# Patient Record
Sex: Female | Born: 1935
Health system: Southern US, Community
[De-identification: ages and names within clinical notes are randomized; demographics above are authoritative.]

## PROBLEM LIST (undated history)

## (undated) DIAGNOSIS — I10 Essential (primary) hypertension: Secondary | ICD-10-CM

## (undated) DIAGNOSIS — R51 Headache: Secondary | ICD-10-CM

## (undated) DIAGNOSIS — M8580 Other specified disorders of bone density and structure, unspecified site: Secondary | ICD-10-CM

## (undated) DIAGNOSIS — K279 Peptic ulcer, site unspecified, unspecified as acute or chronic, without hemorrhage or perforation: Secondary | ICD-10-CM

## (undated) DIAGNOSIS — I5030 Unspecified diastolic (congestive) heart failure: Secondary | ICD-10-CM

## (undated) DIAGNOSIS — K219 Gastro-esophageal reflux disease without esophagitis: Secondary | ICD-10-CM

## (undated) DIAGNOSIS — F4320 Adjustment disorder, unspecified: Secondary | ICD-10-CM

## (undated) DIAGNOSIS — D649 Anemia, unspecified: Secondary | ICD-10-CM

## (undated) DIAGNOSIS — I2510 Atherosclerotic heart disease of native coronary artery without angina pectoris: Secondary | ICD-10-CM

## (undated) DIAGNOSIS — B029 Zoster without complications: Secondary | ICD-10-CM

## (undated) DIAGNOSIS — I82409 Acute embolism and thrombosis of unspecified deep veins of unspecified lower extremity: Secondary | ICD-10-CM

## (undated) DIAGNOSIS — I219 Acute myocardial infarction, unspecified: Secondary | ICD-10-CM

## (undated) DIAGNOSIS — E785 Hyperlipidemia, unspecified: Secondary | ICD-10-CM

## (undated) DIAGNOSIS — E70319 Ocular albinism, unspecified: Secondary | ICD-10-CM

## (undated) DIAGNOSIS — F4321 Adjustment disorder with depressed mood: Secondary | ICD-10-CM

## (undated) DIAGNOSIS — I252 Old myocardial infarction: Secondary | ICD-10-CM

## (undated) DIAGNOSIS — I639 Cerebral infarction, unspecified: Secondary | ICD-10-CM

## (undated) DIAGNOSIS — G600 Hereditary motor and sensory neuropathy: Secondary | ICD-10-CM

## (undated) DIAGNOSIS — K5790 Diverticulosis of intestine, part unspecified, without perforation or abscess without bleeding: Secondary | ICD-10-CM

## (undated) DIAGNOSIS — R32 Unspecified urinary incontinence: Secondary | ICD-10-CM

## (undated) DIAGNOSIS — K449 Diaphragmatic hernia without obstruction or gangrene: Secondary | ICD-10-CM

## (undated) HISTORY — DX: Zoster without complications: B02.9

## (undated) HISTORY — DX: Diverticulosis of intestine, part unspecified, without perforation or abscess without bleeding: K57.90

## (undated) HISTORY — DX: Gastro-esophageal reflux disease without esophagitis: K21.9

## (undated) HISTORY — DX: Other specified disorders of bone density and structure, unspecified site: M85.80

## (undated) HISTORY — DX: Hereditary motor and sensory neuropathy: G60.0

## (undated) HISTORY — DX: Anemia, unspecified: D64.9

## (undated) HISTORY — DX: Headache: R51

## (undated) HISTORY — DX: Hyperlipidemia, unspecified: E78.5

## (undated) HISTORY — DX: Acute embolism and thrombosis of unspecified deep veins of unspecified lower extremity: I82.409

## (undated) HISTORY — DX: Adjustment disorder, unspecified: F43.20

## (undated) HISTORY — DX: Essential (primary) hypertension: I10

## (undated) HISTORY — DX: Peptic ulcer, site unspecified, unspecified as acute or chronic, without hemorrhage or perforation: K27.9

## (undated) HISTORY — DX: Ocular albinism, unspecified: E70.319

## (undated) HISTORY — DX: Adjustment disorder with depressed mood: F43.21

## (undated) HISTORY — DX: Cerebral infarction, unspecified: I63.9

## (undated) HISTORY — DX: Unspecified urinary incontinence: R32

## (undated) HISTORY — DX: Diaphragmatic hernia without obstruction or gangrene: K44.9

---

## 1962-01-27 HISTORY — PX: VESICOVAGINAL FISTULA CLOSURE W/ TAH: SUR271

## 1998-01-27 HISTORY — PX: HIATAL HERNIA REPAIR: SHX195

## 2000-01-28 HISTORY — PX: HERNIA REPAIR: SHX51

## 2000-01-28 HISTORY — PX: COLONOSCOPY: SHX174

## 2000-01-28 LAB — HM COLONOSCOPY: HM Colonoscopy: NORMAL

## 2001-01-27 DIAGNOSIS — I82409 Acute embolism and thrombosis of unspecified deep veins of unspecified lower extremity: Secondary | ICD-10-CM

## 2001-01-27 HISTORY — DX: Acute embolism and thrombosis of unspecified deep veins of unspecified lower extremity: I82.409

## 2001-09-08 ENCOUNTER — Encounter: Payer: Self-pay | Admitting: Orthopaedic Surgery

## 2001-09-14 ENCOUNTER — Inpatient Hospital Stay (HOSPITAL_COMMUNITY): Admission: RE | Admit: 2001-09-14 | Discharge: 2001-09-17 | Payer: Self-pay | Admitting: Orthopaedic Surgery

## 2001-09-16 ENCOUNTER — Encounter: Payer: Self-pay | Admitting: Orthopaedic Surgery

## 2001-09-17 ENCOUNTER — Inpatient Hospital Stay
Admission: RE | Admit: 2001-09-17 | Discharge: 2001-09-29 | Payer: Self-pay | Admitting: Physical Medicine & Rehabilitation

## 2002-05-23 ENCOUNTER — Emergency Department (HOSPITAL_COMMUNITY): Admission: EM | Admit: 2002-05-23 | Discharge: 2002-05-23 | Payer: Self-pay | Admitting: Emergency Medicine

## 2002-05-23 ENCOUNTER — Encounter: Payer: Self-pay | Admitting: Emergency Medicine

## 2004-03-27 HISTORY — PX: TOTAL KNEE ARTHROPLASTY: SHX125

## 2004-08-23 ENCOUNTER — Inpatient Hospital Stay: Payer: Self-pay | Admitting: Internal Medicine

## 2004-11-07 ENCOUNTER — Inpatient Hospital Stay (HOSPITAL_COMMUNITY): Admission: RE | Admit: 2004-11-07 | Discharge: 2004-11-12 | Payer: Self-pay | Admitting: Orthopaedic Surgery

## 2004-11-07 ENCOUNTER — Ambulatory Visit: Payer: Self-pay | Admitting: Physical Medicine & Rehabilitation

## 2004-11-12 ENCOUNTER — Inpatient Hospital Stay
Admission: RE | Admit: 2004-11-12 | Discharge: 2004-11-20 | Payer: Self-pay | Admitting: Physical Medicine & Rehabilitation

## 2006-08-16 ENCOUNTER — Encounter: Payer: Self-pay | Admitting: Family Medicine

## 2006-11-11 ENCOUNTER — Encounter: Payer: Self-pay | Admitting: Family Medicine

## 2006-11-30 ENCOUNTER — Ambulatory Visit: Payer: Self-pay | Admitting: Family Medicine

## 2006-11-30 DIAGNOSIS — J45909 Unspecified asthma, uncomplicated: Secondary | ICD-10-CM

## 2006-11-30 DIAGNOSIS — Z8672 Personal history of thrombophlebitis: Secondary | ICD-10-CM

## 2006-11-30 DIAGNOSIS — K922 Gastrointestinal hemorrhage, unspecified: Secondary | ICD-10-CM | POA: Insufficient documentation

## 2006-11-30 DIAGNOSIS — I10 Essential (primary) hypertension: Secondary | ICD-10-CM | POA: Insufficient documentation

## 2006-11-30 DIAGNOSIS — Z8719 Personal history of other diseases of the digestive system: Secondary | ICD-10-CM | POA: Insufficient documentation

## 2006-12-01 LAB — CONVERTED CEMR LAB
Basophils Relative: 0.4 % (ref 0.0–1.0)
Eosinophils Relative: 1.4 % (ref 0.0–5.0)
HCT: 39.4 % (ref 36.0–46.0)
Lymphocytes Relative: 23.7 % (ref 12.0–46.0)
MCV: 92.7 fL (ref 78.0–100.0)
Monocytes Relative: 6.8 % (ref 3.0–11.0)
Platelets: 153 10*3/uL (ref 150–400)
RDW: 12.7 % (ref 11.5–14.6)
WBC: 6.4 10*3/uL (ref 4.5–10.5)

## 2006-12-03 ENCOUNTER — Encounter: Payer: Self-pay | Admitting: Family Medicine

## 2007-03-05 ENCOUNTER — Telehealth: Payer: Self-pay | Admitting: Family Medicine

## 2007-04-22 ENCOUNTER — Telehealth: Payer: Self-pay | Admitting: Family Medicine

## 2007-05-26 ENCOUNTER — Ambulatory Visit: Payer: Self-pay | Admitting: Family Medicine

## 2007-05-26 DIAGNOSIS — E785 Hyperlipidemia, unspecified: Secondary | ICD-10-CM | POA: Insufficient documentation

## 2007-05-27 ENCOUNTER — Encounter (INDEPENDENT_AMBULATORY_CARE_PROVIDER_SITE_OTHER): Payer: Self-pay | Admitting: *Deleted

## 2007-05-27 LAB — CONVERTED CEMR LAB
AST: 21 units/L (ref 0–37)
Calcium: 9.3 mg/dL (ref 8.4–10.5)
Cholesterol: 393 mg/dL (ref 0–200)
Direct LDL: 300.5 mg/dL
Eosinophils Absolute: 0.1 10*3/uL (ref 0.0–0.7)
GFR calc non Af Amer: 75 mL/min
Glucose, Bld: 98 mg/dL (ref 70–99)
HCT: 40.8 % (ref 36.0–46.0)
HDL: 54 mg/dL (ref >39.0)
Lymphocytes Relative: 30.9 % (ref 12.0–46.0)
MCHC: 33.6 g/dL (ref 30.0–36.0)
Monocytes Absolute: 0.3 10*3/uL (ref 0.1–1.0)
Monocytes Relative: 7.8 % (ref 3.0–12.0)
Neutrophils Relative %: 59.5 % (ref 43.0–77.0)
Phosphorus: 4 mg/dL (ref 2.3–4.6)
Platelets: 169 10*3/uL (ref 150–400)
Potassium: 4.2 meq/L (ref 3.5–5.1)
RDW: 11.6 % (ref 11.5–14.6)
Sodium: 135 meq/L (ref 135–145)
Total CHOL/HDL Ratio: 7.3
Triglycerides: 134 mg/dL (ref 0–149)
VLDL: 27 mg/dL (ref 0–40)

## 2007-05-28 ENCOUNTER — Encounter (INDEPENDENT_AMBULATORY_CARE_PROVIDER_SITE_OTHER): Payer: Self-pay | Admitting: *Deleted

## 2007-05-28 LAB — CONVERTED CEMR LAB: Vit D, 1,25-Dihydroxy: 8 — ABNORMAL LOW (ref 30–89)

## 2007-06-18 ENCOUNTER — Ambulatory Visit: Payer: Self-pay | Admitting: Family Medicine

## 2007-06-22 LAB — FECAL OCCULT BLOOD, GUAIAC: Fecal Occult Blood: NEGATIVE

## 2007-07-07 ENCOUNTER — Encounter: Payer: Self-pay | Admitting: Family Medicine

## 2007-07-09 ENCOUNTER — Encounter (INDEPENDENT_AMBULATORY_CARE_PROVIDER_SITE_OTHER): Payer: Self-pay | Admitting: *Deleted

## 2007-07-09 ENCOUNTER — Ambulatory Visit: Payer: Self-pay | Admitting: Family Medicine

## 2007-07-12 LAB — CONVERTED CEMR LAB
Cholesterol: 338 mg/dL (ref 0–200)
Direct LDL: 254.2 mg/dL
Total CHOL/HDL Ratio: 6.2
Triglycerides: 118 mg/dL (ref 0–149)
VLDL: 24 mg/dL (ref 0–40)

## 2007-07-13 ENCOUNTER — Encounter (INDEPENDENT_AMBULATORY_CARE_PROVIDER_SITE_OTHER): Payer: Self-pay | Admitting: *Deleted

## 2007-07-14 ENCOUNTER — Ambulatory Visit: Payer: Self-pay | Admitting: Family Medicine

## 2007-07-14 DIAGNOSIS — G4730 Sleep apnea, unspecified: Secondary | ICD-10-CM

## 2007-07-14 DIAGNOSIS — E559 Vitamin D deficiency, unspecified: Secondary | ICD-10-CM

## 2007-08-16 ENCOUNTER — Ambulatory Visit: Payer: Self-pay | Admitting: Family Medicine

## 2007-08-19 LAB — CONVERTED CEMR LAB
ALT: 18 units/L (ref 0–35)
Direct LDL: 228.4 mg/dL
Total CHOL/HDL Ratio: 7
VLDL: 30 mg/dL (ref 0–40)
Vit D, 1,25-Dihydroxy: 20 — ABNORMAL LOW (ref 30–89)

## 2007-11-01 ENCOUNTER — Ambulatory Visit: Payer: Self-pay | Admitting: Family Medicine

## 2007-11-04 ENCOUNTER — Telehealth: Payer: Self-pay | Admitting: Family Medicine

## 2007-11-04 LAB — CONVERTED CEMR LAB
ALT: 20 units/L (ref 0–35)
AST: 19 units/L (ref 0–37)
Direct LDL: 237.6 mg/dL
Total CHOL/HDL Ratio: 6.4
Triglycerides: 152 mg/dL — ABNORMAL HIGH (ref 0–149)
Vit D, 1,25-Dihydroxy: 18 — ABNORMAL LOW (ref 30–89)

## 2008-02-08 ENCOUNTER — Encounter: Payer: Self-pay | Admitting: Family Medicine

## 2008-05-01 ENCOUNTER — Telehealth: Payer: Self-pay | Admitting: Family Medicine

## 2008-05-30 ENCOUNTER — Ambulatory Visit: Payer: Self-pay | Admitting: Family Medicine

## 2008-05-30 DIAGNOSIS — M8580 Other specified disorders of bone density and structure, unspecified site: Secondary | ICD-10-CM

## 2008-05-30 DIAGNOSIS — M159 Polyosteoarthritis, unspecified: Secondary | ICD-10-CM | POA: Insufficient documentation

## 2008-05-31 ENCOUNTER — Telehealth (INDEPENDENT_AMBULATORY_CARE_PROVIDER_SITE_OTHER): Payer: Self-pay | Admitting: *Deleted

## 2008-06-01 ENCOUNTER — Telehealth: Payer: Self-pay | Admitting: Family Medicine

## 2008-06-01 ENCOUNTER — Encounter: Payer: Self-pay | Admitting: Family Medicine

## 2008-06-01 LAB — CONVERTED CEMR LAB
ALT: 17 units/L (ref 0–35)
Alkaline Phosphatase: 67 units/L (ref 39–117)
BUN: 26 mg/dL — ABNORMAL HIGH (ref 6–23)
Basophils Relative: 1.4 % (ref 0.0–3.0)
Bilirubin, Direct: 0 mg/dL (ref 0.0–0.3)
CO2: 30 meq/L (ref 19–32)
Calcium: 9.2 mg/dL (ref 8.4–10.5)
Creatinine, Ser: 1 mg/dL (ref 0.4–1.2)
Eosinophils Absolute: 0.1 10*3/uL (ref 0.0–0.7)
Eosinophils Relative: 1.8 % (ref 0.0–5.0)
Glucose, Bld: 82 mg/dL (ref 70–99)
Hemoglobin: 12.9 g/dL (ref 12.0–15.0)
Lymphocytes Relative: 33 % (ref 12.0–46.0)
MCHC: 34.3 g/dL (ref 30.0–36.0)
Monocytes Relative: 3.4 % (ref 3.0–12.0)
Neutro Abs: 2.7 10*3/uL (ref 1.4–7.7)
Neutrophils Relative %: 60.4 % (ref 43.0–77.0)
RBC: 4.05 M/uL (ref 3.87–5.11)
Sodium: 141 meq/L (ref 135–145)
Total Bilirubin: 0.7 mg/dL (ref 0.3–1.2)
Total Protein: 6.4 g/dL (ref 6.0–8.3)
WBC: 4.7 10*3/uL (ref 4.5–10.5)

## 2008-06-03 ENCOUNTER — Encounter: Payer: Self-pay | Admitting: Family Medicine

## 2008-06-29 ENCOUNTER — Encounter: Payer: Self-pay | Admitting: Family Medicine

## 2008-07-14 ENCOUNTER — Ambulatory Visit: Payer: Self-pay | Admitting: Family Medicine

## 2008-07-20 LAB — CONVERTED CEMR LAB
ALT: 19 units/L (ref 0–35)
AST: 19 units/L (ref 0–37)
Cholesterol: 322 mg/dL — ABNORMAL HIGH (ref 0–200)
Total CHOL/HDL Ratio: 5
Triglycerides: 102 mg/dL (ref 0.0–149.0)
VLDL: 20.4 mg/dL (ref 0.0–40.0)

## 2008-08-27 DIAGNOSIS — I639 Cerebral infarction, unspecified: Secondary | ICD-10-CM

## 2008-08-27 HISTORY — PX: OTHER SURGICAL HISTORY: SHX169

## 2008-08-27 HISTORY — DX: Cerebral infarction, unspecified: I63.9

## 2008-09-04 ENCOUNTER — Telehealth: Payer: Self-pay | Admitting: Family Medicine

## 2008-09-04 ENCOUNTER — Emergency Department: Payer: No Typology Code available for payment source | Admitting: Emergency Medicine

## 2008-09-04 ENCOUNTER — Encounter: Payer: Self-pay | Admitting: Family Medicine

## 2008-09-04 DIAGNOSIS — R51 Headache: Secondary | ICD-10-CM

## 2008-09-04 DIAGNOSIS — R519 Headache, unspecified: Secondary | ICD-10-CM | POA: Insufficient documentation

## 2008-09-05 ENCOUNTER — Ambulatory Visit: Payer: Self-pay | Admitting: Cardiology

## 2008-09-05 ENCOUNTER — Inpatient Hospital Stay (HOSPITAL_COMMUNITY): Admission: EM | Admit: 2008-09-05 | Discharge: 2008-09-11 | Payer: Self-pay | Admitting: Emergency Medicine

## 2008-09-05 ENCOUNTER — Telehealth: Payer: Self-pay | Admitting: Family Medicine

## 2008-09-05 ENCOUNTER — Ambulatory Visit: Payer: No Typology Code available for payment source | Admitting: Family Medicine

## 2008-09-05 ENCOUNTER — Ambulatory Visit: Payer: Self-pay | Admitting: Internal Medicine

## 2008-09-05 ENCOUNTER — Encounter: Payer: Self-pay | Admitting: Family Medicine

## 2008-09-06 ENCOUNTER — Encounter (INDEPENDENT_AMBULATORY_CARE_PROVIDER_SITE_OTHER): Payer: Self-pay | Admitting: Internal Medicine

## 2008-09-11 ENCOUNTER — Encounter: Payer: Self-pay | Admitting: Internal Medicine

## 2008-09-11 ENCOUNTER — Encounter: Payer: Self-pay | Admitting: Family Medicine

## 2008-09-12 ENCOUNTER — Telehealth: Payer: Self-pay | Admitting: Family Medicine

## 2008-09-12 DIAGNOSIS — I6350 Cerebral infarction due to unspecified occlusion or stenosis of unspecified cerebral artery: Secondary | ICD-10-CM

## 2008-09-13 ENCOUNTER — Encounter: Payer: Self-pay | Admitting: Family Medicine

## 2008-09-13 ENCOUNTER — Emergency Department (HOSPITAL_COMMUNITY): Admission: EM | Admit: 2008-09-13 | Discharge: 2008-09-14 | Payer: Self-pay | Admitting: Emergency Medicine

## 2008-09-18 ENCOUNTER — Telehealth: Payer: Self-pay | Admitting: Family Medicine

## 2008-09-20 ENCOUNTER — Telehealth: Payer: Self-pay | Admitting: Family Medicine

## 2008-09-21 ENCOUNTER — Inpatient Hospital Stay (HOSPITAL_COMMUNITY): Admission: EM | Admit: 2008-09-21 | Discharge: 2008-09-25 | Payer: Self-pay | Admitting: Emergency Medicine

## 2008-09-21 ENCOUNTER — Telehealth: Payer: Self-pay | Admitting: Family Medicine

## 2008-09-21 ENCOUNTER — Encounter: Payer: Self-pay | Admitting: Family Medicine

## 2008-09-21 ENCOUNTER — Ambulatory Visit: Payer: No Typology Code available for payment source | Admitting: Family Medicine

## 2008-09-21 ENCOUNTER — Ambulatory Visit: Payer: Self-pay | Admitting: Internal Medicine

## 2008-09-25 ENCOUNTER — Encounter: Payer: Self-pay | Admitting: Family Medicine

## 2008-09-25 ENCOUNTER — Telehealth: Payer: Self-pay | Admitting: Family Medicine

## 2008-09-26 ENCOUNTER — Telehealth: Payer: Self-pay | Admitting: Family Medicine

## 2008-09-26 ENCOUNTER — Encounter: Payer: Self-pay | Admitting: Family Medicine

## 2008-09-27 ENCOUNTER — Encounter: Payer: Self-pay | Admitting: Family Medicine

## 2008-09-28 ENCOUNTER — Encounter: Payer: Self-pay | Admitting: Family Medicine

## 2008-09-28 ENCOUNTER — Ambulatory Visit: Payer: No Typology Code available for payment source | Admitting: Family Medicine

## 2008-09-29 ENCOUNTER — Telehealth: Payer: Self-pay | Admitting: Family Medicine

## 2008-09-29 ENCOUNTER — Encounter: Payer: Self-pay | Admitting: Family Medicine

## 2008-09-29 ENCOUNTER — Inpatient Hospital Stay (HOSPITAL_COMMUNITY): Admission: EM | Admit: 2008-09-29 | Discharge: 2008-10-12 | Payer: Self-pay | Admitting: Emergency Medicine

## 2008-10-12 ENCOUNTER — Encounter: Payer: Self-pay | Admitting: Family Medicine

## 2008-10-16 ENCOUNTER — Encounter: Payer: Self-pay | Admitting: Family Medicine

## 2008-10-19 ENCOUNTER — Encounter: Payer: Self-pay | Admitting: Family Medicine

## 2008-10-23 ENCOUNTER — Telehealth: Payer: Self-pay | Admitting: Family Medicine

## 2008-10-23 ENCOUNTER — Encounter: Payer: Self-pay | Admitting: Interventional Radiology

## 2008-10-25 ENCOUNTER — Ambulatory Visit: Payer: Self-pay | Admitting: Family Medicine

## 2008-10-27 LAB — CONVERTED CEMR LAB
ALT: 19 units/L (ref 0–35)
AST: 19 units/L (ref 0–37)
Albumin: 3.4 g/dL — ABNORMAL LOW (ref 3.5–5.2)
BUN: 17 mg/dL (ref 6–23)
Basophils Absolute: 0 10*3/uL (ref 0.0–0.1)
CO2: 28 meq/L (ref 19–32)
Calcium: 9.3 mg/dL (ref 8.4–10.5)
Chloride: 103 meq/L (ref 96–112)
Creatinine, Ser: 0.9 mg/dL (ref 0.4–1.2)
Eosinophils Absolute: 0.1 10*3/uL (ref 0.0–0.7)
GFR calc non Af Amer: 64.67 mL/min (ref >60)
Glucose, Bld: 89 mg/dL (ref 70–99)
HCT: 35.8 % — ABNORMAL LOW (ref 36.0–46.0)
Lymphs Abs: 1.4 10*3/uL (ref 0.7–4.0)
MCHC: 33.5 g/dL (ref 30.0–36.0)
MCV: 94.2 fL (ref 78.0–100.0)
Monocytes Absolute: 0.4 10*3/uL (ref 0.1–1.0)
Monocytes Relative: 7.6 % (ref 3.0–12.0)
Neutro Abs: 3.4 10*3/uL (ref 1.4–7.7)
Neutrophils Relative %: 62.8 % (ref 43.0–77.0)
Platelets: 182 10*3/uL (ref 150.0–400.0)
RDW: 12.1 % (ref 11.5–14.6)
Sodium: 138 meq/L (ref 135–145)
WBC: 5.3 10*3/uL (ref 4.5–10.5)

## 2008-11-08 ENCOUNTER — Encounter: Payer: Self-pay | Admitting: Family Medicine

## 2008-11-14 ENCOUNTER — Telehealth: Payer: Self-pay | Admitting: Family Medicine

## 2008-11-15 ENCOUNTER — Encounter: Payer: Self-pay | Admitting: Family Medicine

## 2008-11-21 ENCOUNTER — Encounter: Payer: Self-pay | Admitting: Family Medicine

## 2008-11-22 ENCOUNTER — Telehealth: Payer: Self-pay | Admitting: Family Medicine

## 2008-12-01 ENCOUNTER — Ambulatory Visit: Payer: Self-pay | Admitting: Family Medicine

## 2008-12-01 DIAGNOSIS — R0602 Shortness of breath: Secondary | ICD-10-CM

## 2008-12-11 ENCOUNTER — Telehealth: Payer: Self-pay | Admitting: Family Medicine

## 2008-12-14 ENCOUNTER — Ambulatory Visit: Payer: Self-pay | Admitting: Family Medicine

## 2008-12-19 ENCOUNTER — Telehealth: Payer: Self-pay | Admitting: Family Medicine

## 2008-12-20 ENCOUNTER — Telehealth: Payer: Self-pay | Admitting: Internal Medicine

## 2008-12-20 ENCOUNTER — Ambulatory Visit: Payer: Self-pay | Admitting: Internal Medicine

## 2008-12-25 ENCOUNTER — Telehealth: Payer: Self-pay | Admitting: Family Medicine

## 2008-12-27 ENCOUNTER — Ambulatory Visit (HOSPITAL_COMMUNITY): Admission: RE | Admit: 2008-12-27 | Discharge: 2008-12-27 | Payer: Self-pay | Admitting: Family Medicine

## 2008-12-27 ENCOUNTER — Ambulatory Visit: Payer: Self-pay | Admitting: Family Medicine

## 2008-12-28 ENCOUNTER — Ambulatory Visit: Payer: Self-pay | Admitting: Family Medicine

## 2008-12-28 ENCOUNTER — Telehealth: Payer: Self-pay | Admitting: Family Medicine

## 2008-12-29 ENCOUNTER — Telehealth: Payer: Self-pay | Admitting: Family Medicine

## 2008-12-29 ENCOUNTER — Ambulatory Visit: Payer: Self-pay | Admitting: Family Medicine

## 2009-01-05 ENCOUNTER — Ambulatory Visit: Payer: Self-pay | Admitting: Family Medicine

## 2009-01-23 ENCOUNTER — Ambulatory Visit: Payer: Self-pay | Admitting: Family Medicine

## 2009-01-29 LAB — CONVERTED CEMR LAB
Albumin: 3.4 g/dL — ABNORMAL LOW (ref 3.5–5.2)
Calcium: 9.2 mg/dL (ref 8.4–10.5)
Creatinine, Ser: 0.9 mg/dL (ref 0.4–1.2)
Glucose, Bld: 93 mg/dL (ref 70–99)
Phosphorus: 4.1 mg/dL (ref 2.3–4.6)
Potassium: 4.7 meq/L (ref 3.5–5.1)
Sodium: 139 meq/L (ref 135–145)

## 2009-03-20 ENCOUNTER — Ambulatory Visit: Payer: Self-pay | Admitting: Family Medicine

## 2009-03-21 ENCOUNTER — Telehealth: Payer: Self-pay | Admitting: Family Medicine

## 2009-03-21 LAB — CONVERTED CEMR LAB: Vit D, 25-Hydroxy: 21 ng/mL — ABNORMAL LOW (ref 30–89)

## 2009-03-23 ENCOUNTER — Encounter: Payer: Self-pay | Admitting: Family Medicine

## 2009-03-23 LAB — CONVERTED CEMR LAB
ALT: 20 units/L (ref 0–35)
AST: 17 units/L (ref 0–37)
Cholesterol: 191 mg/dL (ref 0–200)
Creatinine, Ser: 0.8 mg/dL (ref 0.4–1.2)
GFR calc non Af Amer: 74.01 mL/min (ref >60)
Glucose, Bld: 103 mg/dL — ABNORMAL HIGH (ref 70–99)
LDL Cholesterol: 94 mg/dL (ref 0–99)
Phosphorus: 4.8 mg/dL — ABNORMAL HIGH (ref 2.3–4.6)
Potassium: 5.4 meq/L — ABNORMAL HIGH (ref 3.5–5.1)
Sodium: 139 meq/L (ref 135–145)
Total CHOL/HDL Ratio: 2
VLDL: 18 mg/dL (ref 0.0–40.0)

## 2009-03-30 ENCOUNTER — Ambulatory Visit: Payer: Self-pay | Admitting: Family Medicine

## 2009-04-02 ENCOUNTER — Telehealth: Payer: Self-pay | Admitting: Family Medicine

## 2009-04-02 LAB — CONVERTED CEMR LAB: Potassium: 5.1 meq/L (ref 3.5–5.3)

## 2009-04-09 ENCOUNTER — Telehealth: Payer: Self-pay | Admitting: Family Medicine

## 2009-04-17 ENCOUNTER — Encounter: Payer: Self-pay | Admitting: Family Medicine

## 2009-04-20 ENCOUNTER — Telehealth: Payer: Self-pay | Admitting: Family Medicine

## 2009-06-11 ENCOUNTER — Telehealth: Payer: Self-pay | Admitting: Family Medicine

## 2009-07-06 ENCOUNTER — Ambulatory Visit: Payer: Self-pay | Admitting: Family Medicine

## 2009-07-09 ENCOUNTER — Encounter: Payer: Self-pay | Admitting: Family Medicine

## 2009-07-09 LAB — CONVERTED CEMR LAB
Albumin: 3.9 g/dL (ref 3.5–5.2)
BUN: 15 mg/dL (ref 6–23)
Calcium: 9 mg/dL (ref 8.4–10.5)
Chloride: 108 meq/L (ref 96–112)
Creatinine, Ser: 0.91 mg/dL (ref 0.40–1.20)
Phosphorus: 3.5 mg/dL (ref 2.3–4.6)
Potassium: 5.1 meq/L (ref 3.5–5.3)
Vit D, 25-Hydroxy: 24 ng/mL — ABNORMAL LOW (ref 30–89)

## 2009-10-02 ENCOUNTER — Ambulatory Visit: Payer: Self-pay | Admitting: Family Medicine

## 2009-10-03 LAB — CONVERTED CEMR LAB: Vit D, 25-Hydroxy: 33 ng/mL (ref 30–89)

## 2009-12-10 ENCOUNTER — Telehealth: Payer: Self-pay | Admitting: Family Medicine

## 2009-12-25 ENCOUNTER — Telehealth: Payer: Self-pay | Admitting: Family Medicine

## 2010-01-23 ENCOUNTER — Telehealth: Payer: Self-pay | Admitting: Family Medicine

## 2010-02-16 ENCOUNTER — Ambulatory Visit
Admission: RE | Admit: 2010-02-16 | Discharge: 2010-02-16 | Payer: Self-pay | Source: Home / Self Care | Attending: Internal Medicine | Admitting: Internal Medicine

## 2010-02-16 DIAGNOSIS — T50995A Adverse effect of other drugs, medicaments and biological substances, initial encounter: Secondary | ICD-10-CM | POA: Insufficient documentation

## 2010-02-16 DIAGNOSIS — J019 Acute sinusitis, unspecified: Secondary | ICD-10-CM | POA: Insufficient documentation

## 2010-02-17 ENCOUNTER — Encounter: Payer: Self-pay | Admitting: Interventional Radiology

## 2010-02-24 LAB — CONVERTED CEMR LAB
ALT: 13 units/L (ref 0–35)
AST: 13 units/L (ref 0–37)
BUN: 25 mg/dL — ABNORMAL HIGH (ref 6–23)
BUN: 9 mg/dL (ref 6–23)
CO2: 23 meq/L (ref 19–32)
CO2: 28 meq/L (ref 19–32)
Calcium: 8.7 mg/dL (ref 8.4–10.5)
Calcium: 8.9 mg/dL (ref 8.4–10.5)
Creatinine, Ser: 0.8 mg/dL (ref 0.4–1.2)
Creatinine, Ser: 0.81 mg/dL (ref 0.40–1.20)
GFR calc non Af Amer: 74.05 mL/min (ref >60)
Glucose, Bld: 100 mg/dL — ABNORMAL HIGH (ref 70–99)
Glucose, Bld: 118 mg/dL — ABNORMAL HIGH (ref 70–99)
HDL: 60 mg/dL (ref >39)
Pro B Natriuretic peptide (BNP): 106 pg/mL — ABNORMAL HIGH (ref 0.0–100.0)
Sodium: 139 meq/L (ref 135–145)
Total Bilirubin: 0.3 mg/dL (ref 0.3–1.2)
Total CHOL/HDL Ratio: 3.1
Total CK: 60 units/L (ref 7–177)
VLDL: 28 mg/dL (ref 0–40)

## 2010-02-26 NOTE — Miscellaneous (Signed)
Summary: Cozaar 100mg  update  Clinical Lists Changes  Medications: Changed medication from COZAAR 100 MG TABS (LOSARTAN POTASSIUM) 1 by mouth once daily to COZAAR 100 MG TABS (LOSARTAN POTASSIUM) 1/2  by mouth once daily Observations: Added new observation of MEDS REVIEW: Done (03/23/2009 12:55)     Current Allergies: ! * PREDNISONE ! ZOCOR ! LIPITOR ! CIPRO ! MACROBID ! NORVASC PENICILLIN CODEINE SULFA

## 2010-02-26 NOTE — Progress Notes (Signed)
Summary: Need orders sent  to New apt .Marland Kitchen  Phone Note From Pharmacy   Caller: Daughter Call For: Judith Part MD Summary of Call: Daughter called say pt need order faxed to pts new apt complex.Marland KitchenMarland KitchenThey need an order stating pt needs to use a  4 wheel walker for assistances and  she needs to take tylenol for pain in her  wrist..Marland KitchenFax# [phone] Daughter Dondra Spry # [phone].Marland KitchenDaine Gip  Jun 11, 2009 1:49 PM  Initial call taken by: Daine Gip,  Jun 11, 2009 1:49 PM  Follow-up for Phone Call        will put these on px to fax   oarders faxed to [phone] as instructed. Orders are at Rena's desk if needed later. Was not able to reach [facility] at [phone] (no v/m and no answer). [phone] had been disconnected. Kae Heller' cell [phone] mailbox is full and no answer at pt's #.Lewanda Rife LPN  Jun 11, 2009 4:50 PM   Dondra Spry  notified as instructed by telephone. Lewanda Rife LPN  Jun 12, 2009 8:18 AM  Follow-up by: Judith Part MD,  Jun 11, 2009 1:54 PM    New/Updated Medications: TYLENOL EXTRA STRENGTH 500 MG TABS (ACETAMINOPHEN) 1 by mouth every four hours as needed for pain * WHEELED WALKER WITH SEAT- PT MUST USE FOR AMBULATION for use as directed - ambulation dx: 729.5, 780.79, 786.05, 784.0, 715.89 Prescriptions: TYLENOL EXTRA STRENGTH 500 MG TABS (ACETAMINOPHEN) 1 by mouth every four hours as needed for pain  #60 x 11   Entered and Authorized by:   Judith Part MD   Signed by:   Judith Part MD on 06/11/2009   Method used:   Print then Give to Patient   RxID:   [id number] WHEELED WALKER WITH SEAT- PT MUST USE FOR AMBULATION for use as directed - ambulation dx: 729.5, 780.79, 786.05, 784.0, 715.89  #1 x 0   Entered and Authorized by:   Judith Part MD   Signed by:   Judith Part MD on 06/11/2009   Method used:   Print then Give to Patient   RxID:   [id number]   Appended Document: Need orders sent  to New apt .Marland Kitchen Advised pt that orders were faxed.

## 2010-02-26 NOTE — Progress Notes (Signed)
Summary: regarding PPI's  Phone Note Call from Patient   Caller: Daughter- Dondra Spry  [phone] Summary of Call: Daughter called to report that pt has checked with her insurance company and the PPI's that they prefer are nexium, lansoprazole and pantoprazole.  Please let daughter know which one you prefer. Initial call taken by: Lowella Petties CMA,  April 02, 2009 1:13 PM  Follow-up for Phone Call        going to go with generic of prevacid -- put that on emr for call in  if this does not help her symptoms -- please let me know  Follow-up by: Judith Part MD,  April 02, 2009 1:29 PM  Additional Follow-up for Phone Call Additional follow up Details #1::        Dondra Spry notified, [facility] per Gail's request.   Additional Follow-up by: Linde Gillis CMA Duncan Dull),  April 02, 2009 2:03 PM   New Allergies: ! * OMEPRAZOLE New/Updated Medications: LANSOPRAZOLE 30 MG CPDR (LANSOPRAZOLE) 1 by mouth once daily in am New Allergies: ! * OMEPRAZOLEPrescriptions: LANSOPRAZOLE 30 MG CPDR (LANSOPRAZOLE) 1 by mouth once daily in am  #30 x 11   Entered and Authorized by:   Judith Part MD   Signed by:   Linde Gillis CMA (AAMA) on 04/02/2009   Method used:   Telephoned to ...         RxID:   [id number]

## 2010-02-26 NOTE — Assessment & Plan Note (Signed)
Summary: 3 m f/u dlo   Vital Signs:  Patient profile:   77 year old female Height:      57 inches Weight:      201.25 pounds BMI:     43.71 Temp:     98 degrees F oral Pulse rate:   76 / minute Pulse rhythm:   regular BP sitting:   116 / 70  (left arm) Cuff size:   large  Vitals Entered By: Lewanda Rife LPN (July 06, 2009 4:49 PM) CC: three month follow up   History of Present Illness: here to f/u for mult med problems incl HTN and lipids and low d level and GERD  is on prevacid now for gerd (protonix did not control it ) is doing fine with that a lot better - no longer has reflux and no tums needed at all  aware on plavix  no more constipation   bp great 116/70 had to cut ARB due to inc K - but doing fine  D was low - due to check after high dose tx  no trouble with that at all  is on otc   our scales have her up 12 lb no change by her scales and stays at 192 -- does not think our wt is accurate  clothes fit the same  feeling great !- overall   nothing new -- is better strength wise  has not needed tylenol in about 2 weeks  got a walker this week - and is ready to start some walking  -- is less afraid to walk  hands are a lot better   Allergies: 1)  ! * Prednisone 2)  ! Zocor 3)  ! Lipitor 4)  ! Cipro 5)  ! Macrobid 6)  ! Norvasc 7)  ! * Protonix 8)  ! * Omeprazole 9)  Penicillin 10)  Codeine 11)  Sulfa  Past History:  Past Medical History: Last updated: 12/01/2008 anemia- in past (from blood loss at hiatal hernia) hiatal hernia charcot marie tooth disease- walks with a cane (fairly controlled) OA asthma diverticulosis headache- migraine GERD PUD gastric (past) HTN hyperlipidemia DVT 2003 after sx urine incontinence grief rxn (on fluoxetine) osteopenia  past hx of shingles  8/10 CVA vs posterior reversible encephalopathy syndrome   neuro- Sethi  opthy- Dr Alferd Apa ([facility])   Past Surgical History: Last updated: 09/12/2008 hiatal  hernia repain 2000- needed blood transfusion knee repl bilateral 03, 06ccy hyst 1964 - with bleeding, and cervical ? dysplasia 2002 hernia - bleeding  2002 colonosc 2002 8/10 hosp stroke / posterior (vs posterior reversible encephalopathy syndrome) - d/c on plavix  8/10 2 D echo EF 60-65% slt inc in pulm art pressure , MR calcification mild 8/10 TEE normal / no embolic source  4/54 carotid dopplers   Family History: Last updated: 12/20/2008 son cerebral aneurysm father cerebral aneuysm mother CVA sister cervical cancer? father etoh, hyperlipidemia 2 brothers etoh, hyperlipidemia brother CVA brother DM sister DM sister with sarcoid sister with OP/hip broken  whole family has charcot marie tooth sister with ovarian cancer   Social History: Last updated: 05-30-07 Never Smoked G6P4 son died 2 years ago cerebral anerysm widowed no alcohol   Risk Factors: Smoking Status: never (11/30/2006)  Review of Systems General:  Denies fatigue, loss of appetite, and malaise. Eyes:  Denies blurring and eye pain. CV:  Denies chest pain or discomfort, palpitations, and shortness of breath with exertion. Resp:  Denies cough, shortness of breath, and wheezing.  GI:  Denies change in bowel habits, nausea, and vomiting. GU:  Denies hematuria and urinary frequency. MS:  Complains of joint pain; denies stiffness. Derm:  Denies itching, lesion(s), poor wound healing, and rash. Neuro:  Denies numbness and tingling. Psych:  mood is fairly good . Endo:  Denies cold intolerance, excessive thirst, excessive urination, and heat intolerance. Heme:  Denies abnormal bruising and bleeding.  Physical Exam  General:  frail appearing elderly female in wheelchair Head:  normocephalic, atraumatic, and no abnormalities observed.   Eyes:  vision grossly intact, pupils equal, pupils round, and pupils reactive to light.   Mouth:  pharynx pink and moist.   Neck:  supple with full rom and no masses or  thyromegally, no JVD or carotid bruit  Lungs:  Normal respiratory effort, chest expands symmetrically. Lungs are clear to auscultation, no crackles or wheezes. Heart:  Normal rate and regular rhythm. S1 and S2 normal without gallop, murmur, click, rub or other extra sounds. Abdomen:  soft, non-tender, and normal bowel sounds.  no renal bruits  Msk:  no new jjoint changes grip is improved  Pulses:  R and L carotid,radial,femoral,dorsalis pedis and posterior tibial pulses are full and equal bilaterally Extremities:  trace pedal edema- no tenderness  Neurologic:  sensation intact to light touch, gait normal, and DTRs symmetrical and normal.   Skin:  Intact without suspicious lesions or rashes Cervical Nodes:  No lymphadenopathy noted Psych:  normal affect, talkative and pleasant    Impression & Recommendations:  Problem # 1:  HYPERLIPIDEMIA (ICD-272.4) Assessment Unchanged  very good control - no change in meds  Her updated medication list for this problem includes:    Crestor 10 Mg Tabs (Rosuvastatin calcium) .Marland Kitchen... 1 by mouth once daily in evening    Lovaza 1 Gm Caps (Omega-3-acid ethyl esters) .Marland Kitchen... Take 1 tablet by mouth once a day  Labs Reviewed: SGOT: 17 (03/20/2009)   SGPT: 20 (03/20/2009)   HDL:79.30 (03/20/2009), 60 (12/01/2008)  LDL:94 (03/20/2009), 97 (12/01/2008)  Chol:191 (03/20/2009), 185 (12/01/2008)  Trig:90.0 (03/20/2009), 139 (12/01/2008)  Problem # 2:  UNSPECIFIED VITAMIN D DEFICIENCY (ICD-268.9) Assessment: Improved  check today after high dose tx  pt tol well   Orders: Venipuncture (09811) T-Renal Function Panel ([id number]) T- * Misc. Laboratory test [id number]) Specimen Handling (57846)  Problem # 3:  HYPERTENSION (ICD-401.9) Assessment: Improved  bp remains in good control with 1/2 of arb dose lab today for renal panel to check K as well  rev low sodium diet  Her updated medication list for this problem includes:    Cozaar 100 Mg Tabs (Losartan  potassium) .Marland Kitchen... 1/2  by mouth once daily    Spironolactone 50 Mg Tabs (Spironolactone) .Marland Kitchen... 1 by mouth once daily  Orders: Venipuncture (96295) T-Renal Function Panel [id number]) T- * Misc. Laboratory test [id number])  BP today: 116/70 Prior BP: 124/72 (03/30/2009)  Labs Reviewed: K+: 5.1 (03/30/2009) Creat: : 0.8 (03/20/2009)   Chol: 191 (03/20/2009)   HDL: 79.30 (03/20/2009)   LDL: 94 (03/20/2009)   TG: 90.0 (03/20/2009)  Problem # 4:  GERD (ICD-530.1) Assessment: Improved much improved with current ppi- prevacid  awae this can interact with plavix but symptoms were severe without it and protonix did not work for her will continue this  disc diet  wt loss would help  Complete Medication List: 1)  Fluoxetine Hcl 20 Mg Caps (Fluoxetine hcl) .... One by mouth once daily 2)  Proventil Hfa 108 (90 Base) Mcg/act Aers (Albuterol sulfate) .... 2 puffs  up to every  4 hours as needed wheezing 3)  Flax Seed Oil 1000 Mg  .... Take 1 tablet by mouth once a day 4)  Crestor 10 Mg Tabs (Rosuvastatin calcium) .Marland Kitchen.. 1 by mouth once daily in evening 5)  Lovaza 1 Gm Caps (Omega-3-acid ethyl esters) .... Take 1 tablet by mouth once a day 6)  Plavix 75 Mg Tabs (Clopidogrel bisulfate) .... Take 1 tablet by mouth once a day 7)  Cozaar 100 Mg Tabs (Losartan potassium) .... 1/2  by mouth once daily 8)  Tylenol Extra Strength 500 Mg Tabs (Acetaminophen) .Marland Kitchen.. 1 by mouth every four hours as needed for pain 9)  Spironolactone 50 Mg Tabs (Spironolactone) .Marland Kitchen.. 1 by mouth once daily 10)  Lansoprazole 30 Mg Cpdr (Lansoprazole) .Marland Kitchen.. 1 by mouth once daily in am 11)  Wheeled Walker With Seat- Pt Must Use For Ambulation  .... For use as directed - ambulation dx: 729.5, 780.79, 786.05, 784.0, 715.89 12)  Vitamin D ?iu  .... Take two tablets by mouth daily  Patient Instructions: 1)  labs today  2)  I will update you with how much vitamin D to take 3)  blood pressure is good 4)  follow up with me in 6 months  please 5)  no change in medicines   Current Allergies (reviewed today): ! * PREDNISONE ! ZOCOR ! LIPITOR ! CIPRO ! MACROBID ! NORVASC ! * PROTONIX ! * OMEPRAZOLE PENICILLIN CODEINE SULFA

## 2010-02-26 NOTE — Miscellaneous (Signed)
Summary: Vitamin D 50000iu rx and med list updated  Clinical Lists Changes  Medications: Added new medication of VITAMIN D (ERGOCALCIFEROL) 50000 UNIT CAPS (ERGOCALCIFEROL) Take one capsule by mouth every week for 12 weeks. - Signed Added new medication of VITAMIN D 2000 UNIT TABS (CHOLECALCIFEROL) Take 1 tablet by mouth once a day Removed medication of * VITAMIN D ?IU take two tablets by mouth daily Rx of VITAMIN D (ERGOCALCIFEROL) 50000 UNIT CAPS (ERGOCALCIFEROL) Take one capsule by mouth every week for 12 weeks.;  #12 x 0;  Signed;  Entered by: Lewanda Rife LPN;  Authorized by: Judith Part MD;  Method used: [id number] [address], [place], [place]  [address], Ph: [phone] or [phone], Fax: [phone]    Prescriptions: VITAMIN D (ERGOCALCIFEROL) 50000 UNIT CAPS (ERGOCALCIFEROL) Take one capsule by mouth every week for 12 weeks.  #12 x 0   Entered by:   Lewanda Rife LPN   Authorized by:   Judith Part MD   Signed by:   Lewanda Rife LPN on 52/84/1324   Method used:   [facility] to        [id number] ([facility]ail[facility]       [address]       [place], [place]  [address]       Ph: [phone] or [phone]       Fax: [phone]   RxID:   [id number]  Pt scheduled lab for Vit D on 10/02/09 at 3:40pm.Rena [facility] LPN  July 09, 2009 9:25 AM  Current Allergies: ! * PREDNISONE ! ZOCOR ! LIPITOR ! CIPRO ! MACROBID ! NORVASC ! * PROTONIX ! * OMEPRAZOLE PENICILLIN CODEINE SULFA

## 2010-02-26 NOTE — Assessment & Plan Note (Signed)
Summary: 3 M F/U DLO   Vital Signs:  Patient profile:   77 year old female Height:      57 inches Weight:      189.25 pounds BMI:     41.10 Temp:     97.8 degrees F oral Pulse rate:   72 / minute Pulse rhythm:   regular BP sitting:   124 / 72  (left arm) Cuff size:   large  Vitals Entered By: Lewanda Rife LPN (March 30, 1608 3:34 PM)  History of Present Illness: here for f/u of HTN and lipids and low vit D   has been feeling ok  a little headache today - above her R eye -- and in cheeks  thinks it is a sinus infection  congested and cough no colored discharge  no fevers does get sinus infections easily   some fatigue  is having a hard time passing stool  has to pass hard stool -every day  no blood at all   is doing better with fruit and veg  is walking more indoors with a cane  does get sob easily    bp at home have been ok    wt is stable   bp is great today at 124/72- despite cutting cozaar to 50 for high K   on crestor fair lipid control with trig 90/ HDL 79 and LDL 94  is watching sat fats in diet   K was 5.4- need to re check today  vit D low at 21   (has been as low as 14 in past) has not been taking any otc at all   having dyspepsia symptoms on omeprazole -- a lot of breakthrough and taking a lot tums  no vomiting or trouble swallowing ? wonders if this is what makes her sob does clear throat frequently  Allergies: 1)  ! * Prednisone 2)  ! Zocor 3)  ! Lipitor 4)  ! Cipro 5)  ! Macrobid 6)  ! Norvasc 7)  ! * Protonix 8)  Penicillin 9)  Codeine 10)  Sulfa  Past History:  Past Medical History: Last updated: 12/01/2008 anemia- in past (from blood loss at hiatal hernia) hiatal hernia charcot marie tooth disease- walks with a cane (fairly controlled) OA asthma diverticulosis headache- migraine GERD PUD gastric (past) HTN hyperlipidemia DVT 2003 after sx urine incontinence grief rxn (on fluoxetine) osteopenia  past hx of shingles   8/10 CVA vs posterior reversible encephalopathy syndrome   neuro- Sethi  opthy- Dr Alferd Apa ([facility])   Past Surgical History: Last updated: 09/12/2008 hiatal hernia repain 2000- needed blood transfusion knee repl bilateral 03, 06ccy hyst 1964 - with bleeding, and cervical ? dysplasia 2002 hernia - bleeding  2002 colonosc 2002 8/10 hosp stroke / posterior (vs posterior reversible encephalopathy syndrome) - d/c on plavix  8/10 2 D echo EF 60-65% slt inc in pulm art pressure , MR calcification mild 8/10 TEE normal / no embolic source  9/60 carotid dopplers   Family History: Last updated: 12/20/2008 son cerebral aneurysm father cerebral aneuysm mother CVA sister cervical cancer? father etoh, hyperlipidemia 2 brothers etoh, hyperlipidemia brother CVA brother DM sister DM sister with sarcoid sister with OP/hip broken  whole family has charcot marie tooth sister with ovarian cancer   Social History: Last updated: 06/17/2007 Never Smoked G6P4 son died 2 years ago cerebral anerysm widowed no alcohol   Risk Factors: Smoking Status: never (11/30/2006)  Review of Systems General:  Complains of fatigue;  denies fever, loss of appetite, and malaise. Eyes:  Complains of eye pain; denies blurring. ENT:  Complains of hoarseness, nasal congestion, postnasal drainage, and sinus pressure; denies sore throat. CV:  Denies chest pain or discomfort and palpitations. Resp:  Complains of cough; denies sputum productive and wheezing. GI:  Complains of gas, indigestion, and nausea; denies abdominal pain, change in bowel habits, and vomiting. GU:  Denies urinary frequency. MS:  Complains of stiffness. Derm:  Denies lesion(s), poor wound healing, and rash. Neuro:  Complains of headaches; denies numbness and tingling. Psych:  mood is about the same . Endo:  Denies excessive thirst and excessive urination. Heme:  Denies abnormal bruising and bleeding.   Impression &  Recommendations:  Problem # 1:  HYPERKALEMIA (ICD-276.7) Assessment New suspect from ARB cut dose in 1/2 and now re check K hopefully will not need to stop it  adv when lab returns Orders: Venipuncture (16[address]) T-Potassium  ([id number]) Specimen Handling (91478)  Problem # 2:  UNSPECIFIED VITAMIN D DEFICIENCY (ICD-268.9) Assessment: Deteriorated  this is down again px called in for 50.000 international units vit D weekly for 12 weeks will then re check and make rec for daily dose  no sympt  disc imp for bone health f/u 3 mo visit and labs   Orders: Prescription Created Electronically [phone])  Problem # 3:  SINUSITIS - ACUTE-NOS (ICD-461.9) Assessment: New with sinus tenderness/ facial pain and congestion/cough cover with zithromax because pt is all or intol of almost all otherabx  update if not imp recommend sympt care- see pt instructions   Her updated medication list for this problem includes:    Zithromax Z-pak 250 Mg Tabs (Azithromycin) .Marland Kitchen... Take by mouth as directed  Problem # 4:  GERD (ICD-530.1) Assessment: Deteriorated is more severe and now aff swallowing  although not optimal to mix ppi with plavix- will need to do so needs higher power one- and pt states protonix does not work family will call ins for covered opt and update me  if not imp needs GI eval  ? if this may inc her sob as well   Problem # 5:  CONSTIPATION (ICD-564.00) Assessment: Deteriorated worse lately on diuretic  disc imp of fluids and fiber  trial of colace or miralax and update f/u 3 mo   Problem # 6:  HYPERTENSION (ICD-401.9) Assessment: Unchanged  bp ok on 1/2 cozaar lab today low salt diet rec f/u 3 mo  Her updated medication list for this problem includes:    Cozaar 100 Mg Tabs (Losartan potassium) .Marland Kitchen... 1/2  by mouth once daily    Spironolactone 50 Mg Tabs (Spironolactone) .Marland Kitchen... 1 by mouth once daily  BP today: 124/72 Prior BP: 142/80 (01/23/2009)  Labs Reviewed: K+:  5.4 (03/20/2009) Creat: : 0.8 (03/20/2009)   Chol: 191 (03/20/2009)   HDL: 79.30 (03/20/2009)   LDL: 94 (03/20/2009)   TG: 90.0 (03/20/2009)  Complete Medication List: 1)  Fluoxetine Hcl 20 Mg Caps (Fluoxetine hcl) .... One by mouth once daily 2)  Proventil Hfa 108 (90 Base) Mcg/act Aers (Albuterol sulfate) .... 2 puffs up to every  4 hours as needed wheezing 3)  Flax Seed Oil 1000 Mg  .... Take 1 tablet by mouth once a day 4)  Crestor 10 Mg Tabs (Rosuvastatin calcium) .Marland Kitchen.. 1 by mouth once daily in evening 5)  Omeprazole 20 Mg Tbec (Omeprazole) .Marland Kitchen.. 1 by mouth once daily 6)  Lovaza 1 Gm Caps (Omega-3-acid ethyl esters) .... Take 1 tablet by mouth  once a day 7)  Plavix 75 Mg Tabs (Clopidogrel bisulfate) .... Take 1 tablet by mouth once a day 8)  Cozaar 100 Mg Tabs (Losartan potassium) .... 1/2  by mouth once daily 9)  Tylenol Extra Strength 500 Mg Tabs (Acetaminophen) .Marland Kitchen.. 1 by mouth every four hours as needed 10)  Spironolactone 50 Mg Tabs (Spironolactone) .Marland Kitchen.. 1 by mouth once daily 11)  Zithromax Z-pak 250 Mg Tabs (Azithromycin) .... Take by mouth as directed 12)  Ergocalciferol 50000 Unit Caps (Ergocalciferol) .Marland Kitchen.. 1 by mouth once weekly for 12 weeks  Patient Instructions: 1)  try colace two times a day as stool softener with lots of water  2)  if that does not work try miralax over the counter - can use daily if needed  3)  start 12 weeks of vitamin D  4)  call your insurance company and tell them you need a proton pump inhibitor -- tell them omeprazole and protonix do not work  5)  then call and tell me which drug is covered so I can px it  6)  continue other meds 7)  checking your potassium level today and will update you about that  8)  follow up with me in about 3 months and will recheck vit D level  9)  take the zithromax for sinus infection  10)  keep using nasal saline spray  Prescriptions: ERGOCALCIFEROL 50000 UNIT CAPS (ERGOCALCIFEROL) 1 by mouth once weekly for 12 weeks  #12  x 0   Entered and Authorized by:   Judith Part MD   Signed by:   Judith Part MD on 03/30/2009   Method used:   [id number]       [address]       [place], [place]  [address]       Ph: [phone] or [phone]       Fax: [phone]   RxID:   [id number] ZITHROMAX Z-PAK 250 MG TABS (AZITHROMYCIN) take by mouth as directed  #1 pack x 0   Entered and Authorized by:   Judith Part MD   Signed by:   Judith Part MD on 03/30/2009   Method used:   [facility]        [id number]       [address]       [place], [place]  [address]       Ph: [phone] or [phone]       Fax: [phone]   RxID:   [id number]   Current Allergies (reviewed today): ! * PREDNISONE ! ZOCOR ! LIPITOR ! CIPRO ! MACROBID ! NORVASC ! * PROTONIX PENICILLIN CODEINE SULFA

## 2010-02-26 NOTE — Letter (Signed)
Summary: CMN for CPAP/[facility]  CMN for CPAP/[facility]   Imported By: Lanelle Bal 04/20/2009 11:32:13  _____________________________________________________________________  External Attachment:    Type:   Image     Comment:   External Document

## 2010-02-26 NOTE — Progress Notes (Signed)
Summary: BP is low  Phone Note Call from Patient   Caller: Daughter  Velva Harman  [phone] Call For: Judith Part MD Summary of Call: [name] reports that pt's BP has been running low.  Readings of 90/40, 101/60, 100/60.  Pt feels ok but does feel a little whoozy when she stands up.  She is asking if they need to continue to monitor this for a few more days or if her meds need to be reduced. Initial call taken by: Lowella Petties CMA, AAMA,  December 10, 2009 2:15 PM  Follow-up for Phone Call        cut her spironolactone in 1/2 -- just give half dose and update me with response in next 2-3 d please Follow-up by: Judith Part MD,  December 10, 2009 4:11 PM  Additional Follow-up for Phone Call Additional follow up Details #1::        Patient's daughter Dondra Spry notified as instructed by telephone. Lewanda Rife LPN  December 10, 2009 5:07 PM

## 2010-02-26 NOTE — Progress Notes (Signed)
Summary: problem with allergies  Phone Note Call from Patient Call back at Home Phone [phone]   Caller: Patient Call For: Judith Part MD Summary of Call: Pt is having a problem with allergies- sinus pain, runny nose, sneezing and she is asking what she can take otc with her other meds. Initial call taken by: Lowella Petties CMA,  April 20, 2009 10:45 AM  Follow-up for Phone Call        I would try zyrtec 10 mg or claritin 10 mg otc one pill daily - either of these is ok f/u if not imp Follow-up by: Judith Part MD,  April 20, 2009 11:57 AM  Additional Follow-up for Phone Call Additional follow up Details #1::        Patient advised as instructed. Additional Follow-up by: Linde Gillis CMA Duncan Dull),  April 20, 2009 12:49 PM

## 2010-02-26 NOTE — Progress Notes (Signed)
Summary: wants order for walker  Phone Note Call from Patient   Caller: DaughterDondra Spry  [phone] Summary of Call: Pt requests order for a walker with a seat.  Please mail to patient when ready. Initial call taken by: Lowella Petties CMA,  April 09, 2009 1:03 PM  Follow-up for Phone Call        will do px on eMR Follow-up by: Judith Part MD,  April 09, 2009 2:03 PM  Additional Follow-up for Phone Call Additional follow up Details #1::        Spoke with Dondra Spry and she said to mail to pt's home address. Rx mailed to patient as instructed. Lewanda Rife LPN  April 09, 2009 5:25 PM     New/Updated Medications: * WHEELED [place] WITH SEAT for use as directed - ambulation dx: 729.5, 780.79, 786.05, 784.0, 715.89 Prescriptions: WHEELED WALKER WITH SEAT for use as directed - ambulation dx: 729.5, 780.79, 786.05, 784.0, 715.89  #1 x 0   Entered and Authorized by:   Judith Part MD   Signed by:   Judith Part MD on 04/09/2009   Method used:   Print then Give to Patient   RxID:   [id number] WHEELED WALKER WITH SEAT for use as directed - ambulation dx: 729.5, 780.79, 786.05, 784.0, 715.89  #1 x 0   Entered and Authorized by:   Judith Part MD   Signed by:   Lewanda Rife LPN on 36/64/4034   Method used:   Telephoned to ...         RxID:   [id number]

## 2010-02-26 NOTE — Progress Notes (Signed)
Summary: regarding lansoprazole  Phone Note From Pharmacy   Caller: [facility] [address] Summary of Call: Pharmacy faxed a form stating that starting in january pt's insurance will no longer cover lansoprazole and they are asking that you change pt to something else.  Form is on your shelf. Initial call taken by: Lowella Petties CMA, AAMA,  December 25, 2009 8:43 AM  Follow-up for Phone Call        please let pt know  alt include omeprazoleand protonix (both did not work so not options) last choice is nexium-- does she want to try it or pay out of pocket for what she is taking? I will hold form on my desk Follow-up by: Judith Part MD,  December 26, 2009 1:26 PM  Additional Follow-up for Phone Call Additional follow up Details #1::        Spoke with patient's daughter Lydia King and advised her of the situation. She advised that her mother would want to try the Nexium. I told her we would get the form sent in and her Rx in January would be for that. She verbalized understanding and would take care of lettng her mother know. Additional Follow-up by: Janee Morn CMA Duncan Dull),  December 28, 2009 4:50 PM    Additional Follow-up for Phone Call Additional follow up Details #2::    form done and in nurse in box  I have changed it on med list  Follow-up by: Judith Part MD,  December 28, 2009 4:58 PM  Additional Follow-up for Phone Call Additional follow up Details #3:: Details for Additional Follow-up Action Taken: completed form faxed to [phone] as instructed.Lewanda Rife LPN  December 31, 2009 8:37 AM   New/Updated Medications: NEXIUM 40 MG CPDR (ESOMEPRAZOLE MAGNESIUM) 1 by mouth once daily Prescriptions: NEXIUM 40 MG CPDR (ESOMEPRAZOLE MAGNESIUM) 1 by mouth once daily  #30 x 11   Entered by:   Lewanda Rife LPN   Authorized by:   Judith Part MD   Signed by:   Lewanda Rife LPN on 47/82/9562   Method used:   Historical   RxID:   [id number]

## 2010-02-26 NOTE — Progress Notes (Signed)
Summary: can she take sinupret  Phone Note Call from Patient Call back at Home Phone [phone]   Caller: Patient Call For: Judith Part MD Summary of Call: Patient has sinus problems and wants to know if it would be safe for her to take sinupret because she has had strokes in the past. Please advise.  Initial call taken by: Melody Comas,  March 21, 2009 10:15 AM  Follow-up for Phone Call        I have not heard of that med nor is it in my computer database  ? can you find out what it is - perhaps from pharmacy  Follow-up by: Judith Part MD,  March 21, 2009 10:22 AM  Additional Follow-up for Phone Call Additional follow up Details #1::        Thayer Ohm from [facility] pharmacy said they do order and it is an herbal homiopathic product. I called the pt and the ingredients include Water, Maltitol,ethyl alcohol, cherry flavoring. >56 ethyl alcohol per serving. Supplemental facts: sugar, alcohol, sinupret property blend, cowslip,sorrel, European elder, European vervain and gentian root. (Pt has sore cheek bones and h/a over her eyes in forehead.) Lewanda Rife LPN  March 21, 2009 10:55 AM     Additional Follow-up for Phone Call Additional follow up Details #2::    I do not know all of the properties of the different herbs -- so there is always a risk with this sort of product for this reason it has alcohol - she should know this  I cannot comment on overall risk of this product for these reasons   some safe options for congestion include plain mucinex / nasal saline spray or netti pot f/u if not improved   Follow-up by: Judith Part MD,  March 21, 2009 11:20 AM  Additional Follow-up for Phone Call Additional follow up Details #3:: Details for Additional Follow-up Action Taken: Patient notified as instructed by telephone. Lewanda Rife LPN  March 21, 2009 11:24 AM

## 2010-02-28 NOTE — Assessment & Plan Note (Signed)
Summary: ? SINUS INFECTION   Vital Signs:  Patient profile:   77 year old female Weight:      190 pounds O2 Sat:      95 % on Room air Temp:     98.7 degrees F oral Pulse rate:   76 / minute BP sitting:   148 / 80  (left arm) Cuff size:   large  Vitals Entered By: Lamar Sprinkles, CMA (February 16, 2010 9:26 AM)  O2 Flow:  Room air CC: ?sinus infection - h/a sinus & ear pressure   History of Present Illness: Lydia King.t for  3 weeks of above signs .   onset like a sinus congestion and has done  saline washes but congoing without improvement .   Bp up and down. felt from discomfort . No could or cough med.  Has sensitivity to many and   meds for bp  and hx of cva with residual cortical blindness.  no fever   onset like a sinus soreness and never got better drippy nose.    no cp sob   Preventive Screening-Counseling & Management  Alcohol-Tobacco     Smoking Status: never  Current Medications (verified): 1)  Fluoxetine Hcl 20 Mg  Caps (Fluoxetine Hcl) .... One By Mouth Once Daily 2)  Proventil Hfa 108 (90 Base) Mcg/act Aers (Albuterol Sulfate) .... 2 Puffs Up To Every  4 Hours As Needed Wheezing 3)  Flax Seed Oil 1000 Mg .... Take 1 Tablet By Mouth Once A Day 4)  Crestor 10 Mg Tabs (Rosuvastatin Calcium) .Marland Kitchen.. 1 By Mouth Once Daily in Evening 5)  Lovaza 1 Gm Caps (Omega-3-Acid Ethyl Esters) .... Take 1 Tablet By Mouth Once A Day 6)  Plavix 75 Mg Tabs (Clopidogrel Bisulfate) .... Take 1 Tablet By Mouth Once A Day 7)  Cozaar 100 Mg Tabs (Losartan Potassium) .... 1/2  By Mouth Once Daily 8)  Tylenol Extra Strength 500 Mg Tabs (Acetaminophen) .Marland Kitchen.. 1 By Mouth Every Four Hours As Needed For Pain 9)  Spironolactone 50 Mg Tabs (Spironolactone) .Marland Kitchen.. 1 By Mouth Once Daily 10)  Wheeled Walker With Seat- Pt Must Use For Ambulation .... For Use As Directed - Ambulation Dx: 729.5, 780.79, 786.05, 784.0, 715.89 11)  Vitamin D (Ergocalciferol) 50000 Unit Caps (Ergocalciferol) .... Take One  Capsule By Mouth Every Week For 12 Weeks. 12)  Vitamin D 2000 Unit Tabs (Cholecalciferol) .... Take 2  Tablets By Mouth Once A Day 13)  Nexium 40 Mg Cpdr (Esomeprazole Magnesium) .Marland Kitchen.. 1 By Mouth Once Daily  Allergies (verified): 1)  ! * Prednisone 2)  ! Zocor 3)  ! Lipitor 4)  ! Cipro 5)  ! Macrobid 6)  ! Norvasc 7)  ! * Protonix 8)  ! * Omeprazole 9)  Penicillin 10)  Codeine 11)  Sulfa  Past History:  Past Medical History: Last updated: 12/01/2008 anemia- in past (from blood loss at hiatal hernia) hiatal hernia charcot marie tooth disease- walks with a cane (fairly controlled) OA asthma diverticulosis headache- migraine GERD PUD gastric (past) HTN hyperlipidemia DVT 2003 after sx urine incontinence grief rxn (on fluoxetine) osteopenia  past hx of shingles  8/10 CVA vs posterior reversible encephalopathy syndrome   neuro- Sethi  opthy- Dr Alferd Apa ([facility])   Social History: Last updated: 06/21/2007 Never Smoked G6P4 son died 2 years ago cerebral anerysm widowed no alcohol   Review of Systems       The patient complains of vision loss, decreased hearing, and headaches.  The patient denies anorexia, fever, weight loss, weight gain, chest pain, dyspnea on exertion, prolonged cough, hemoptysis, abdominal pain, enlarged lymph nodes, and angioedema.         gets gi cramps with many meds and foods.   walks with a cane  Physical Exam  General:  Well-developed,well-nourished,in no acute distress; alert,appropriate and cooperative throughout examination Head:  normocephalic and atraumatic.   Eyes:  glasses vision grossly intact.   Ears:  R ear normal, L ear normal, and no external deformities.   Nose:  no external deformity.  right turbinate mores swollen than left  tender  in maxillary and frontal areas .,  Mouth:  pharynx pink and moist.   Neck:  No deformities, masses, or tenderness noted. Lungs:  normal respiratory effort, no intercostal retractions, no  accessory muscle use, and normal breath sounds.   Heart:  normal rate, regular rhythm, no murmur, no rub, and no JVD.   Pulses:  nl cap refill  Neurologic:  walks with cane    face symmetrical  speech normal.  Skin:  turgor normal and color normal.   Cervical Nodes:  No lymphadenopathy noted Psych:  Oriented X3, normally interactive, and good eye contact.     Impression & Recommendations:  Problem # 1:  SINUSITIS - ACUTE-NOS (ICD-461.9) disc optioos    has side effect to many meds   z pack not  first line butmay be able to tolerate.  Expectant management  Her updated medication list for this problem includes:    Azithromycin 250 Mg Tabs (Azithromycin) .Marland Kitchen... Take 2 first day then 1 by mouth once daily  for 4 more days  Problem # 2:  CVA (ICD-434.91) hx of with cortical blindness  stable improving.  Her updated medication list for this problem includes:    Plavix 75 Mg Tabs (Clopidogrel bisulfate) .Marland Kitchen... Take 1 tablet by mouth once a day  Problem # 3:  Hx of ADVERSE REACTION TO MEDICATION (ICD-995.29) hx of multipl  se  gi and other   reviewed reactions to meds   Problem # 4:  HYPERTENSION (ICD-401.9)  Her updated medication list for this problem includes:    Cozaar 100 Mg Tabs (Losartan potassium) .Marland Kitchen... 1/2  by mouth once daily    Spironolactone 50 Mg Tabs (Spironolactone) .Marland Kitchen... 1 by mouth once daily  BP today: 148/80 Prior BP: 116/70 (07/06/2009)  Labs Reviewed: K+: 5.1 (07/06/2009) Creat: : 0.91 (07/06/2009)   Chol: 191 (03/20/2009)   HDL: 79.30 (03/20/2009)   LDL: 94 (03/20/2009)   TG: 90.0 (03/20/2009)  Complete Medication List: 1)  Fluoxetine Hcl 20 Mg Caps (Fluoxetine hcl) .... One by mouth once daily 2)  Proventil Hfa 108 (90 Base) Mcg/act Aers (Albuterol sulfate) .... 2 puffs up to every  4 hours as needed wheezing 3)  Flax Seed Oil 1000 Mg  .... Take 1 tablet by mouth once a day 4)  Crestor 10 Mg Tabs (Rosuvastatin calcium) .Marland Kitchen.. 1 by mouth once daily in evening 5)   Lovaza 1 Gm Caps (Omega-3-acid ethyl esters) .... Take 1 tablet by mouth once a day 6)  Plavix 75 Mg Tabs (Clopidogrel bisulfate) .... Take 1 tablet by mouth once a day 7)  Cozaar 100 Mg Tabs (Losartan potassium) .... 1/2  by mouth once daily 8)  Tylenol Extra Strength 500 Mg Tabs (Acetaminophen) .Marland Kitchen.. 1 by mouth every four hours as needed for pain 9)  Spironolactone 50 Mg Tabs (Spironolactone) .Marland Kitchen.. 1 by mouth once daily 10)  Wheeled Dan Humphreys  With Seat- Pt Must Use For Ambulation  .... For use as directed - ambulation dx: 729.5, 780.79, 786.05, 784.0, 715.89 11)  Vitamin D (ergocalciferol) 50000 Unit Caps (Ergocalciferol) .... Take one capsule by mouth every week for 12 weeks. 12)  Vitamin D 2000 Unit Tabs (Cholecalciferol) .... Take 2  tablets by mouth once a day 13)  Nexium 40 Mg Cpdr (Esomeprazole magnesium) .Marland Kitchen.. 1 by mouth once daily 14)  Azithromycin 250 Mg Tabs (Azithromycin) .... Take 2 first day then 1 by mouth once daily  for 4 more days  Patient Instructions: 1)  add antibiotic for  sinusitis .   expect improvement in 3-5 days  if not getting better  see DR tOWER.  2)  Continue saline washes. Prescriptions: AZITHROMYCIN 250 MG TABS (AZITHROMYCIN) take 2 first day then 1 by mouth once daily  for 4 more days  #6 x 0   Entered and Authorized by:   Madelin Headings MD   Signed by:   Madelin Headings MD on 02/16/2010   Method used:   Electronically to        [id number] (retail)       [address]       [place], [place]  [address]       Ph: [phone] or [phone]       Fax: [phone]   RxID:   [id number]    Orders Added: 1)  Est. Patient Level IV [36644]

## 2010-02-28 NOTE — Progress Notes (Signed)
Summary: Fluoxetine  Phone Note Refill Request Message from:  Scriptline on January 23, 2010 10:24 AM  Refills Requested: Medication #1:  FLUOXETINE HCL 20 MG  CAPS one by mouth once daily [id number]   Last Fill Date:  10/20/2009   Pharmacy Phone:  [phone]   Method Requested: Electronic Initial call taken by: Delilah Shan CMA Duncan Dull),  January 23, 2010 10:24 AM  Follow-up for Phone Call        px written on EMR for call in  Follow-up by: Judith Part MD,  January 23, 2010 10:57 AM  Additional Follow-up for Phone Call Additional follow up Details #1::        Med refill sent. Additional Follow-up by: Delilah Shan CMA Duncan Dull),  January 23, 2010 11:29 AM    Prescriptions: FLUOXETINE HCL 20 MG  CAPS (FLUOXETINE HCL) one by mouth once daily  #90 x 3   Entered by:   Delilah Shan CMA (AAMA)   Authorized by:   Judith Part MD   Signed by:   Delilah Shan CMA Duncan Dull) on 01/23/2010   Method used:   Electronically to        [id number] (retail)       [address]       [place], [place]  [address]       Ph: [phone] or [phone]       Fax: [phone]   RxID:   [id number]

## 2010-05-03 LAB — CBC
HCT: 29.1 % — ABNORMAL LOW (ref 36.0–46.0)
HCT: 31 % — ABNORMAL LOW (ref 36.0–46.0)
HCT: 31.2 % — ABNORMAL LOW (ref 36.0–46.0)
HCT: 31.6 % — ABNORMAL LOW (ref 36.0–46.0)
HCT: 32 % — ABNORMAL LOW (ref 36.0–46.0)
HCT: 32.7 % — ABNORMAL LOW (ref 36.0–46.0)
HCT: 32.8 % — ABNORMAL LOW (ref 36.0–46.0)
HCT: 38.9 % (ref 36.0–46.0)
Hemoglobin: 10.2 g/dL — ABNORMAL LOW (ref 12.0–15.0)
Hemoglobin: 10.4 g/dL — ABNORMAL LOW (ref 12.0–15.0)
Hemoglobin: 10.4 g/dL — ABNORMAL LOW (ref 12.0–15.0)
Hemoglobin: 10.7 g/dL — ABNORMAL LOW (ref 12.0–15.0)
Hemoglobin: 11.2 g/dL — ABNORMAL LOW (ref 12.0–15.0)
Hemoglobin: 11.9 g/dL — ABNORMAL LOW (ref 12.0–15.0)
Hemoglobin: 12.9 g/dL (ref 12.0–15.0)
Hemoglobin: 13.3 g/dL (ref 12.0–15.0)
Hemoglobin: 9.9 g/dL — ABNORMAL LOW (ref 12.0–15.0)
MCHC: 34 g/dL (ref 30.0–36.0)
MCHC: 34 g/dL (ref 30.0–36.0)
MCHC: 34.2 g/dL (ref 30.0–36.0)
MCHC: 34.2 g/dL (ref 30.0–36.0)
MCHC: 34.4 g/dL (ref 30.0–36.0)
MCHC: 34.4 g/dL (ref 30.0–36.0)
MCHC: 34.5 g/dL (ref 30.0–36.0)
MCHC: 34.5 g/dL (ref 30.0–36.0)
MCHC: 34.6 g/dL (ref 30.0–36.0)
MCHC: 35.1 g/dL (ref 30.0–36.0)
MCHC: 35.1 g/dL (ref 30.0–36.0)
MCV: 93.3 fL (ref 78.0–100.0)
MCV: 91.4 fL (ref 78.0–100.0)
MCV: 92.6 fL (ref 78.0–100.0)
MCV: 92.7 fL (ref 78.0–100.0)
MCV: 92.8 fL (ref 78.0–100.0)
MCV: 92.9 fL (ref 78.0–100.0)
MCV: 93 fL (ref 78.0–100.0)
MCV: 93.1 fL (ref 78.0–100.0)
MCV: 93.1 fL (ref 78.0–100.0)
MCV: 93.1 fL (ref 78.0–100.0)
MCV: 93.2 fL (ref 78.0–100.0)
Platelets: 227 10*3/uL (ref 150–400)
Platelets: 143 10*3/uL — ABNORMAL LOW (ref 150–400)
Platelets: 144 10*3/uL — ABNORMAL LOW (ref 150–400)
Platelets: 152 10*3/uL (ref 150–400)
Platelets: 152 10*3/uL (ref 150–400)
Platelets: 162 10*3/uL (ref 150–400)
Platelets: 165 10*3/uL (ref 150–400)
Platelets: 169 10*3/uL (ref 150–400)
Platelets: 183 10*3/uL (ref 150–400)
RBC: 3.08 MIL/uL — ABNORMAL LOW (ref 3.87–5.11)
RBC: 3.21 MIL/uL — ABNORMAL LOW (ref 3.87–5.11)
RBC: 3.26 MIL/uL — ABNORMAL LOW (ref 3.87–5.11)
RBC: 3.34 MIL/uL — ABNORMAL LOW (ref 3.87–5.11)
RBC: 3.74 MIL/uL — ABNORMAL LOW (ref 3.87–5.11)
RBC: 4.09 MIL/uL (ref 3.87–5.11)
RDW: 12.2 % (ref 11.5–15.5)
RDW: 12.4 % (ref 11.5–15.5)
RDW: 12.7 % (ref 11.5–15.5)
RDW: 12.7 % (ref 11.5–15.5)
RDW: 12.7 % (ref 11.5–15.5)
RDW: 12.7 % (ref 11.5–15.5)
RDW: 12.9 % (ref 11.5–15.5)
RDW: 13 % (ref 11.5–15.5)
WBC: 4.9 10*3/uL (ref 4.0–10.5)
WBC: 3.4 10*3/uL — ABNORMAL LOW (ref 4.0–10.5)
WBC: 3.6 10*3/uL — ABNORMAL LOW (ref 4.0–10.5)
WBC: 3.9 10*3/uL — ABNORMAL LOW (ref 4.0–10.5)
WBC: 4.4 10*3/uL (ref 4.0–10.5)
WBC: 4.6 10*3/uL (ref 4.0–10.5)
WBC: 4.6 10*3/uL (ref 4.0–10.5)
WBC: 5 10*3/uL (ref 4.0–10.5)
WBC: 5.4 10*3/uL (ref 4.0–10.5)
WBC: 5.4 10*3/uL (ref 4.0–10.5)
WBC: 5.4 10*3/uL (ref 4.0–10.5)
WBC: 6.7 10*3/uL (ref 4.0–10.5)

## 2010-05-03 LAB — PROTIME-INR
INR: 1 (ref 0.00–1.49)
INR: 0.9 (ref 0.00–1.49)
INR: 0.9 (ref 0.00–1.49)
INR: 1.1 (ref 0.00–1.49)
INR: 1.1 (ref 0.00–1.49)
INR: 1.1 (ref 0.00–1.49)
INR: 1.1 (ref 0.00–1.49)
INR: 1.2 (ref 0.00–1.49)
INR: 1.3 (ref 0.00–1.49)
Prothrombin Time: 13.5 seconds (ref 11.6–15.2)
Prothrombin Time: 12.4 seconds (ref 11.6–15.2)
Prothrombin Time: 13.6 seconds (ref 11.6–15.2)
Prothrombin Time: 13.9 seconds (ref 11.6–15.2)
Prothrombin Time: 14 seconds (ref 11.6–15.2)
Prothrombin Time: 14.1 seconds (ref 11.6–15.2)
Prothrombin Time: 14.1 seconds (ref 11.6–15.2)
Prothrombin Time: 15.5 seconds — ABNORMAL HIGH (ref 11.6–15.2)

## 2010-05-03 LAB — DIFFERENTIAL
Basophils Absolute: 0 10*3/uL (ref 0.0–0.1)
Basophils Relative: 0 % (ref 0–1)
Basophils Relative: 0 % (ref 0–1)
Eosinophils Absolute: 0 10*3/uL (ref 0.0–0.7)
Lymphocytes Relative: 10 % — ABNORMAL LOW (ref 12–46)
Lymphocytes Relative: 9 % — ABNORMAL LOW (ref 12–46)
Lymphs Abs: 0.5 10*3/uL — ABNORMAL LOW (ref 0.7–4.0)
Lymphs Abs: 0.6 10*3/uL — ABNORMAL LOW (ref 0.7–4.0)
Monocytes Absolute: 0.1 10*3/uL (ref 0.1–1.0)
Monocytes Absolute: 0.1 10*3/uL (ref 0.1–1.0)
Monocytes Relative: 1 % — ABNORMAL LOW (ref 3–12)
Monocytes Relative: 2 % — ABNORMAL LOW (ref 3–12)
Neutro Abs: 4.8 10*3/uL (ref 1.7–7.7)
Neutro Abs: 5.4 10*3/uL (ref 1.7–7.7)
Neutrophils Relative %: 88 % — ABNORMAL HIGH (ref 43–77)

## 2010-05-03 LAB — COMPREHENSIVE METABOLIC PANEL
ALT: 21 U/L (ref 0–35)
AST: 19 U/L (ref 0–37)
Albumin: 3.4 g/dL — ABNORMAL LOW (ref 3.5–5.2)
Albumin: 3.4 g/dL — ABNORMAL LOW (ref 3.5–5.2)
Alkaline Phosphatase: 71 U/L (ref 39–117)
Alkaline Phosphatase: 71 U/L (ref 39–117)
BUN: 14 mg/dL (ref 6–23)
CO2: 28 mEq/L (ref 19–32)
Calcium: 9.2 mg/dL (ref 8.4–10.5)
Calcium: 9.4 mg/dL (ref 8.4–10.5)
Creatinine, Ser: 0.58 mg/dL (ref 0.4–1.2)
Creatinine, Ser: 0.59 mg/dL (ref 0.4–1.2)
GFR calc non Af Amer: >60 mL/min (ref >60)
Glucose, Bld: 134 mg/dL — ABNORMAL HIGH (ref 70–99)
Glucose, Bld: 137 mg/dL — ABNORMAL HIGH (ref 70–99)
Potassium: 3.6 mEq/L (ref 3.5–5.1)
Potassium: 3.8 mEq/L (ref 3.5–5.1)
Sodium: 131 mEq/L — ABNORMAL LOW (ref 135–145)
Total Protein: 6.6 g/dL (ref 6.0–8.3)
Total Protein: 6.6 g/dL (ref 6.0–8.3)

## 2010-05-03 LAB — BASIC METABOLIC PANEL
BUN: 3 mg/dL — ABNORMAL LOW (ref 6–23)
BUN: 7 mg/dL (ref 6–23)
CO2: 24 mEq/L (ref 19–32)
CO2: 28 mEq/L (ref 19–32)
CO2: 29 mEq/L (ref 19–32)
Calcium: 8.7 mg/dL (ref 8.4–10.5)
Calcium: 8.8 mg/dL (ref 8.4–10.5)
Calcium: 8.8 mg/dL (ref 8.4–10.5)
Chloride: 102 mEq/L (ref 96–112)
Chloride: 97 mEq/L (ref 96–112)
Creatinine, Ser: 0.58 mg/dL (ref 0.4–1.2)
Creatinine, Ser: 0.68 mg/dL (ref 0.4–1.2)
Creatinine, Ser: 0.96 mg/dL (ref 0.4–1.2)
GFR calc Af Amer: >60 mL/min (ref >60)
GFR calc Af Amer: >60 mL/min (ref >60)
GFR calc Af Amer: >60 mL/min (ref >60)
GFR calc non Af Amer: 57 mL/min — ABNORMAL LOW (ref >60)
GFR calc non Af Amer: >60 mL/min (ref >60)
GFR calc non Af Amer: >60 mL/min (ref >60)
Glucose, Bld: 111 mg/dL — ABNORMAL HIGH (ref 70–99)
Glucose, Bld: 117 mg/dL — ABNORMAL HIGH (ref 70–99)
Glucose, Bld: 177 mg/dL — ABNORMAL HIGH (ref 70–99)
Potassium: 3.7 mEq/L (ref 3.5–5.1)
Potassium: 4.2 mEq/L (ref 3.5–5.1)
Sodium: 134 mEq/L — ABNORMAL LOW (ref 135–145)
Sodium: 137 mEq/L (ref 135–145)
Sodium: 140 mEq/L (ref 135–145)

## 2010-05-03 LAB — HEPARIN LEVEL (UNFRACTIONATED)
Heparin Unfractionated: 0.29 IU/mL — ABNORMAL LOW (ref 0.30–0.70)
Heparin Unfractionated: 0.36 IU/mL (ref 0.30–0.70)
Heparin Unfractionated: 0.41 IU/mL (ref 0.30–0.70)
Heparin Unfractionated: 0.42 IU/mL (ref 0.30–0.70)
Heparin Unfractionated: 0.45 IU/mL (ref 0.30–0.70)
Heparin Unfractionated: 0.5 IU/mL (ref 0.30–0.70)

## 2010-05-03 LAB — URINALYSIS, ROUTINE W REFLEX MICROSCOPIC
Glucose, UA: NEGATIVE mg/dL
Hgb urine dipstick: NEGATIVE
Nitrite: NEGATIVE
Protein, ur: NEGATIVE mg/dL
Specific Gravity, Urine: 1.011 (ref 1.005–1.030)
Urobilinogen, UA: 0.2 mg/dL (ref 0.0–1.0)

## 2010-05-03 LAB — CK TOTAL AND CKMB (NOT AT ARMC)
CK, MB: 1.6 ng/mL (ref 0.3–4.0)
CK, MB: 1.7 ng/mL (ref 0.3–4.0)
Relative Index: INVALID (ref 0.0–2.5)
Relative Index: INVALID (ref 0.0–2.5)
Total CK: 51 U/L (ref 7–177)
Total CK: 57 U/L (ref 7–177)

## 2010-05-03 LAB — GLUCOSE, CAPILLARY: Glucose-Capillary: 141 mg/dL — ABNORMAL HIGH (ref 70–99)

## 2010-05-03 LAB — HOMOCYSTEINE: Homocysteine: 9.7 umol/L (ref 4.0–15.4)

## 2010-05-03 LAB — APTT
aPTT: 115 seconds — ABNORMAL HIGH (ref 24–37)
aPTT: 28 seconds (ref 24–37)
aPTT: 28 seconds (ref 24–37)

## 2010-05-03 LAB — HEMOGLOBIN A1C: Hgb A1c MFr Bld: 5.8 % (ref 4.6–6.1)

## 2010-05-03 LAB — URINE MICROSCOPIC-ADD ON

## 2010-05-03 LAB — TROPONIN I
Troponin I: 0.01 ng/mL (ref 0.00–0.06)
Troponin I: 0.01 ng/mL (ref 0.00–0.06)

## 2010-05-03 LAB — LIPID PANEL
Cholesterol: 219 mg/dL — ABNORMAL HIGH (ref 0–200)
LDL Cholesterol: 155 mg/dL — ABNORMAL HIGH (ref 0–99)
Triglycerides: 74 mg/dL (ref <150)

## 2010-05-03 LAB — URINE CULTURE

## 2010-05-03 LAB — SEDIMENTATION RATE: Sed Rate: 22 mm/hr (ref 0–22)

## 2010-05-04 LAB — BASIC METABOLIC PANEL
BUN: 11 mg/dL (ref 6–23)
BUN: 11 mg/dL (ref 6–23)
BUN: 13 mg/dL (ref 6–23)
BUN: 15 mg/dL (ref 6–23)
BUN: 16 mg/dL (ref 6–23)
BUN: 19 mg/dL (ref 6–23)
BUN: 9 mg/dL (ref 6–23)
BUN: 19 mg/dL (ref 6–23)
CO2: 21 mEq/L (ref 19–32)
CO2: 21 mEq/L (ref 19–32)
CO2: 22 mEq/L (ref 19–32)
CO2: 24 mEq/L (ref 19–32)
CO2: 26 mEq/L (ref 19–32)
CO2: 27 mEq/L (ref 19–32)
CO2: 27 mEq/L (ref 19–32)
CO2: 30 mEq/L (ref 19–32)
Calcium: 8 mg/dL — ABNORMAL LOW (ref 8.4–10.5)
Calcium: 8.1 mg/dL — ABNORMAL LOW (ref 8.4–10.5)
Calcium: 8.1 mg/dL — ABNORMAL LOW (ref 8.4–10.5)
Calcium: 8.2 mg/dL — ABNORMAL LOW (ref 8.4–10.5)
Calcium: 8.4 mg/dL (ref 8.4–10.5)
Calcium: 8.5 mg/dL (ref 8.4–10.5)
Calcium: 8.6 mg/dL (ref 8.4–10.5)
Calcium: 8.6 mg/dL (ref 8.4–10.5)
Calcium: 9.1 mg/dL (ref 8.4–10.5)
Chloride: 83 mEq/L — ABNORMAL LOW (ref 96–112)
Chloride: 85 mEq/L — ABNORMAL LOW (ref 96–112)
Chloride: 86 mEq/L — ABNORMAL LOW (ref 96–112)
Chloride: 87 mEq/L — ABNORMAL LOW (ref 96–112)
Chloride: 97 mEq/L (ref 96–112)
Chloride: 102 mEq/L (ref 96–112)
Chloride: 98 mEq/L (ref 96–112)
Creatinine, Ser: 0.63 mg/dL (ref 0.4–1.2)
Creatinine, Ser: 0.63 mg/dL (ref 0.4–1.2)
Creatinine, Ser: 0.7 mg/dL (ref 0.4–1.2)
Creatinine, Ser: 0.72 mg/dL (ref 0.4–1.2)
Creatinine, Ser: 0.73 mg/dL (ref 0.4–1.2)
Creatinine, Ser: 0.75 mg/dL (ref 0.4–1.2)
Creatinine, Ser: 0.77 mg/dL (ref 0.4–1.2)
Creatinine, Ser: 0.78 mg/dL (ref 0.4–1.2)
Creatinine, Ser: 1.02 mg/dL (ref 0.4–1.2)
Creatinine, Ser: 0.82 mg/dL (ref 0.4–1.2)
GFR calc Af Amer: >60 mL/min (ref >60)
GFR calc Af Amer: >60 mL/min (ref >60)
GFR calc Af Amer: >60 mL/min (ref >60)
GFR calc Af Amer: >60 mL/min (ref >60)
GFR calc Af Amer: >60 mL/min (ref >60)
GFR calc non Af Amer: 53 mL/min — ABNORMAL LOW (ref >60)
GFR calc non Af Amer: >60 mL/min (ref >60)
GFR calc non Af Amer: >60 mL/min (ref >60)
GFR calc non Af Amer: >60 mL/min (ref >60)
GFR calc non Af Amer: >60 mL/min (ref >60)
GFR calc non Af Amer: >60 mL/min (ref >60)
GFR calc non Af Amer: >60 mL/min (ref >60)
GFR calc non Af Amer: >60 mL/min (ref >60)
GFR calc non Af Amer: >60 mL/min (ref >60)
GFR calc non Af Amer: >60 mL/min (ref >60)
Glucose, Bld: 107 mg/dL — ABNORMAL HIGH (ref 70–99)
Glucose, Bld: 114 mg/dL — ABNORMAL HIGH (ref 70–99)
Glucose, Bld: 126 mg/dL — ABNORMAL HIGH (ref 70–99)
Glucose, Bld: 148 mg/dL — ABNORMAL HIGH (ref 70–99)
Glucose, Bld: 155 mg/dL — ABNORMAL HIGH (ref 70–99)
Potassium: 3.2 mEq/L — ABNORMAL LOW (ref 3.5–5.1)
Potassium: 3.4 mEq/L — ABNORMAL LOW (ref 3.5–5.1)
Potassium: 3.4 mEq/L — ABNORMAL LOW (ref 3.5–5.1)
Potassium: 3.7 mEq/L (ref 3.5–5.1)
Potassium: 4.4 mEq/L (ref 3.5–5.1)
Potassium: 3.7 mEq/L (ref 3.5–5.1)
Potassium: 3.8 mEq/L (ref 3.5–5.1)
Potassium: 3.8 mEq/L (ref 3.5–5.1)
Sodium: 123 mEq/L — ABNORMAL LOW (ref 135–145)
Sodium: 133 mEq/L — ABNORMAL LOW (ref 135–145)
Sodium: 134 mEq/L — ABNORMAL LOW (ref 135–145)
Sodium: 136 mEq/L (ref 135–145)
Sodium: 139 mEq/L (ref 135–145)

## 2010-05-04 LAB — URINE CULTURE
Colony Count: >=100000
Colony Count: NO GROWTH
Colony Count: >=100000
Culture: NO GROWTH

## 2010-05-04 LAB — URINALYSIS, MICROSCOPIC ONLY
Bilirubin Urine: NEGATIVE
Glucose, UA: NEGATIVE mg/dL
Hgb urine dipstick: NEGATIVE
Nitrite: NEGATIVE
Specific Gravity, Urine: 1.01 (ref 1.005–1.030)
pH: 5.5 (ref 5.0–8.0)

## 2010-05-04 LAB — URINALYSIS, ROUTINE W REFLEX MICROSCOPIC
Hgb urine dipstick: NEGATIVE
Nitrite: NEGATIVE
Specific Gravity, Urine: 1.017 (ref 1.005–1.030)
Urobilinogen, UA: 0.2 mg/dL (ref 0.0–1.0)
pH: 6.5 (ref 5.0–8.0)

## 2010-05-04 LAB — CBC
HCT: 37 % (ref 36.0–46.0)
HCT: 37.4 % (ref 36.0–46.0)
HCT: 34.8 % — ABNORMAL LOW (ref 36.0–46.0)
HCT: 34.9 % — ABNORMAL LOW (ref 36.0–46.0)
HCT: 36.5 % (ref 36.0–46.0)
Hemoglobin: 11.9 g/dL — ABNORMAL LOW (ref 12.0–15.0)
Hemoglobin: 12.8 g/dL (ref 12.0–15.0)
Hemoglobin: 13.1 g/dL (ref 12.0–15.0)
Hemoglobin: 11.8 g/dL — ABNORMAL LOW (ref 12.0–15.0)
MCHC: 34.7 g/dL (ref 30.0–36.0)
MCHC: 35.1 g/dL (ref 30.0–36.0)
MCHC: 35.2 g/dL (ref 30.0–36.0)
MCV: 90.2 fL (ref 78.0–100.0)
MCV: 91.1 fL (ref 78.0–100.0)
MCV: 92.3 fL (ref 78.0–100.0)
MCV: 92.4 fL (ref 78.0–100.0)
MCV: 93.4 fL (ref 78.0–100.0)
Platelets: 158 10*3/uL (ref 150–400)
Platelets: 193 10*3/uL (ref 150–400)
Platelets: 123 10*3/uL — ABNORMAL LOW (ref 150–400)
Platelets: 131 10*3/uL — ABNORMAL LOW (ref 150–400)
Platelets: 133 10*3/uL — ABNORMAL LOW (ref 150–400)
RBC: 3.73 MIL/uL — ABNORMAL LOW (ref 3.87–5.11)
RBC: 4.01 MIL/uL (ref 3.87–5.11)
RBC: 4.14 MIL/uL (ref 3.87–5.11)
RDW: 12 % (ref 11.5–15.5)
RDW: 12.7 % (ref 11.5–15.5)
RDW: 12.7 % (ref 11.5–15.5)
RDW: 13 % (ref 11.5–15.5)
RDW: 13 % (ref 11.5–15.5)
WBC: 3.4 10*3/uL — ABNORMAL LOW (ref 4.0–10.5)
WBC: 4 10*3/uL (ref 4.0–10.5)
WBC: 5 10*3/uL (ref 4.0–10.5)
WBC: 5.5 10*3/uL (ref 4.0–10.5)

## 2010-05-04 LAB — HEMOGLOBIN A1C: Hgb A1c MFr Bld: 5.3 % (ref 4.6–6.1)

## 2010-05-04 LAB — CARDIAC PANEL(CRET KIN+CKTOT+MB+TROPI)
CK, MB: 1.8 ng/mL (ref 0.3–4.0)
CK, MB: 2.1 ng/mL (ref 0.3–4.0)
CK, MB: 2.5 ng/mL (ref 0.3–4.0)
Relative Index: INVALID (ref 0.0–2.5)
Total CK: 65 U/L (ref 7–177)
Total CK: 81 U/L (ref 7–177)
Troponin I: 0.01 ng/mL (ref 0.00–0.06)
Troponin I: 0.02 ng/mL (ref 0.00–0.06)
Troponin I: 0.03 ng/mL (ref 0.00–0.06)
Troponin I: 0.03 ng/mL (ref 0.00–0.06)

## 2010-05-04 LAB — COMPREHENSIVE METABOLIC PANEL
ALT: 19 U/L (ref 0–35)
ALT: 23 U/L (ref 0–35)
AST: 22 U/L (ref 0–37)
AST: 24 U/L (ref 0–37)
Albumin: 3.3 g/dL — ABNORMAL LOW (ref 3.5–5.2)
Alkaline Phosphatase: 73 U/L (ref 39–117)
Alkaline Phosphatase: 57 U/L (ref 39–117)
CO2: 23 mEq/L (ref 19–32)
Calcium: 8.2 mg/dL — ABNORMAL LOW (ref 8.4–10.5)
Calcium: 9.1 mg/dL (ref 8.4–10.5)
Chloride: 82 mEq/L — ABNORMAL LOW (ref 96–112)
Chloride: 100 mEq/L (ref 96–112)
GFR calc Af Amer: >60 mL/min (ref >60)
GFR calc Af Amer: >60 mL/min (ref >60)
GFR calc non Af Amer: >60 mL/min (ref >60)
Potassium: 3.3 mEq/L — ABNORMAL LOW (ref 3.5–5.1)
Potassium: 3.7 mEq/L (ref 3.5–5.1)
Sodium: 112 mEq/L — CL (ref 135–145)
Total Bilirubin: 0.5 mg/dL (ref 0.3–1.2)
Total Protein: 5.5 g/dL — ABNORMAL LOW (ref 6.0–8.3)

## 2010-05-04 LAB — DIFFERENTIAL
Basophils Absolute: 0 10*3/uL (ref 0.0–0.1)
Basophils Absolute: 0 10*3/uL (ref 0.0–0.1)
Basophils Relative: 0 % (ref 0–1)
Basophils Relative: 1 % (ref 0–1)
Eosinophils Absolute: 0 10*3/uL (ref 0.0–0.7)
Eosinophils Relative: 0 % (ref 0–5)
Eosinophils Relative: 0 % (ref 0–5)
Lymphocytes Relative: 22 % (ref 12–46)
Lymphs Abs: 0.9 10*3/uL (ref 0.7–4.0)
Monocytes Absolute: 0.3 10*3/uL (ref 0.1–1.0)
Monocytes Absolute: 0.2 10*3/uL (ref 0.1–1.0)
Monocytes Relative: 7 % (ref 3–12)
Neutro Abs: 2.8 10*3/uL (ref 1.7–7.7)
Neutrophils Relative %: 70 % (ref 43–77)
Neutrophils Relative %: 73 % (ref 43–77)

## 2010-05-04 LAB — LIPID PANEL
HDL: 48 mg/dL (ref >39)
LDL Cholesterol: 229 mg/dL — ABNORMAL HIGH (ref 0–99)
VLDL: 25 mg/dL (ref 0–40)

## 2010-05-04 LAB — CREATININE, URINE, RANDOM: Creatinine, Urine: 44.7 mg/dL

## 2010-05-04 LAB — URINE MICROSCOPIC-ADD ON

## 2010-05-04 LAB — PROTIME-INR
INR: 1 (ref 0.00–1.49)
Prothrombin Time: 12.9 seconds (ref 11.6–15.2)

## 2010-05-04 LAB — BRAIN NATRIURETIC PEPTIDE: Pro B Natriuretic peptide (BNP): 264 pg/mL — ABNORMAL HIGH (ref 0.0–100.0)

## 2010-05-04 LAB — CK TOTAL AND CKMB (NOT AT ARMC): CK, MB: 2 ng/mL (ref 0.3–4.0)

## 2010-05-04 LAB — MAGNESIUM
Magnesium: 2 mg/dL (ref 1.5–2.5)
Magnesium: 2.2 mg/dL (ref 1.5–2.5)

## 2010-05-04 LAB — OSMOLALITY, URINE: Osmolality, Ur: 493 mOsm/kg (ref 390–1090)

## 2010-05-04 LAB — GLUCOSE, CAPILLARY
Glucose-Capillary: 140 mg/dL — ABNORMAL HIGH (ref 70–99)
Glucose-Capillary: 155 mg/dL — ABNORMAL HIGH (ref 70–99)

## 2010-05-04 LAB — MRSA PCR SCREENING: MRSA by PCR: NEGATIVE

## 2010-05-04 LAB — CORTISOL: Cortisol, Plasma: 14.5 ug/dL

## 2010-05-04 LAB — TSH: TSH: 0.896 u[IU]/mL (ref 0.350–4.500)

## 2010-05-04 LAB — TROPONIN I: Troponin I: 0.02 ng/mL (ref 0.00–0.06)

## 2010-05-06 ENCOUNTER — Other Ambulatory Visit: Payer: Self-pay | Admitting: *Deleted

## 2010-05-06 MED ORDER — ROSUVASTATIN CALCIUM 10 MG PO TABS: 10.0000 mg | ORAL_TABLET | Freq: Every day | ORAL | Status: DC

## 2010-05-09 ENCOUNTER — Other Ambulatory Visit: Payer: Self-pay | Admitting: *Deleted

## 2010-05-09 MED ORDER — ROSUVASTATIN CALCIUM 10 MG PO TABS: 10.0000 mg | ORAL_TABLET | Freq: Every day | ORAL | Status: DC

## 2010-06-11 NOTE — H&P (Signed)
[name], WHITENIGHT NO.:  [id number]   MEDICAL RECORD NO.:  [id number]          PATIENT TYPE:  INP   LOCATION:  1824                         FACILITY:  [facility]   PHYSICIAN:  Ramiro Harvest, MD    DATE OF BIRTH:  03-01-1930   DATE OF ADMISSION:  09/21/2008  DATE OF DISCHARGE:                              HISTORY & PHYSICAL   The patient's primary care physician is Dr. Milinda Antis of [facility].   HISTORY OF PRESENT ILLNESS:  Lydia King is a 77 year old white  female with a past medical history of hypertension, hyperlipidemia,  migraine headaches, depression, who was recently hospitalized August 10  through September 11, 2008 for multiple posterior circulation infarct with  a cortical blindness felt to be secondary to PRES, who presents to the  ED with a 3-day history of nonbloody emesis, chills, nausea, worsening  global weakness, 1 episode of diarrhea which has since resolved.  The  patient denies any fevers, no chest pain, no shortness of breath, no  abdominal pain, no slurred speech, no facial droop.  No asymmetrical  weakness or numbness.  No dysuria, no cough.  The patient endorses  ongoing vertigo since his CVA and swirling lytes since a CVA.  The  patient states that her home-health nurse came by her home on the day of  admission, called her PCP, labs were drawn.  The patient was called to  present to the ED, as a sodium was called of 110.  The patient was seen  in the ED.  Repeat BMET with a sodium of 107, chloride of 76, otherwise  was within normal limits.  CBC within normal limits.  PT of 12.9, INR of  1.0.  We were called to admit the patient for further evaluation and  management.   ALLERGIES:  CODEINE, SULFA, PENICILLIN, LATERAL TIBIAL PLATEAU, ZOCOR,  PREDNISONE, MACROBID, FIORICET, SHELLFISH, SHRIMP.   PAST MEDICAL HISTORY:  1. Recent multiple posterior circulation infarcts, September 05, 2008.  2. Hyperlipidemia.  3. Depression.  4.  Hypertension.  5. Gastroesophageal reflux disease.  6. History of DVT in 2003 after a knee replacement.  7. Obstructive sleep apnea.  8. Osteoarthritis.  9. Osteopenia.  10.History of shingles.  11.Status post hiatal hernia repair in 2000.  12.Bilateral knee replacement 2003 and 2006.  13.Status post hysterectomy in 1964.  14.History of blood-loss anemia after hiatal hernia surgery.  15.Charcot-Marie-Tooth disease with bilateral foot drops.  16.Diverticulosis.  17.Migraines.  18.A history of peptic ulcer disease.   HOME MEDICATIONS:  1. Triamterene/HCTZ 75/58 mg p.o. daily  2. Fish oil 1200 mg p.o. daily.  3. Fluoxetine 20 mg p.o. daily.  4. Lisinopril 20 mg p.o. b.i.d.  5. Omeprazole 20 mg p.o. daily.  6. Coreg 6.25 mg p.o. b.i.d.  7. Norvasc 5 mg p.o. daily.  8. Plavix 75 mg p.o. daily.  9. Dramamine 50 mg 1/2 tablet p.o. t.i.d.   SOCIAL HISTORY:  The patient is widowed, lives alone.  No tobacco use.  No alcohol use.  No IV drug use.  The patient is G6, P4.  Had a son  who  was deceased 2 years ago from a cerebral aneurysm.   FAMILY HISTORY:  Had a son with a cerebral aneurysm.  Father deceased,  age 68, with a history of cerebral aneurysm, a prior history of alcohol  but had quit 15 years prior to his death, and hyperlipidemia.  Mother  deceased age 49 from a CVA.  One sister deceased, has a history of  cervical cancer, diabetes, and sarcoid.  One brother who is alive with  hyperlipidemia, CVA, diabetes and a prior history of alcohol use.  Whole  family with Charcot-Marie-Tooth disease.   REVIEW OF SYSTEMS:  As per HPI, otherwise negative.   PHYSICAL EXAMINATION:  Temperature 98.9, blood pressure 169/71, pulse of  75, respirations 16, satting 96%.  GENERAL: The patient is a well-developed, well-nourished elderly white  female in no apparent distress.  HEENT: Normocephalic, atraumatic.  Pupils equal, round and reactive  light and accommodation.  Extraocular movements  intact.  Oropharynx is  clear.  No lesions or exudates.  NECK:  Supple.  No lymphadenopathy.  Dry mucous membranes.  RESPIRATORY:  Lungs are clear to auscultation bilaterally.  No crackles.  No wheezes, no rhonchi.  CARDIOVASCULAR: Regular rate and rhythm.  No murmurs, rubs or gallops.  ABDOMEN: Soft, nontender, nondistended.  Positive bowel sounds.  EXTREMITIES: No clubbing, cyanosis or edema.  NEUROLOGIC:  The patient is alert and oriented x3.  Cranial nerves II-  XII grossly intact.  Visual fields intact.  5/5 bilateral upper  extremity strength 5/5 bilateral lower extremity strength, sensation is  intact.  Unable to elicit reflexes symmetrically and diffusely.  Positive dysmetria bilateral foot drops.  Gait not tested secondary to  safety admission.   LABS:  BMET:  Sodium 107, potassium 3.7, chloride 76, bicarb 21, glucose  148, BUN 19, creatinine 0.63, calcium of 8.5, PT of 12.9, INR 1.0.  CBC  with a white count of 4.0, hemoglobin of 13.1, hematocrit 37.4, platelet  count of 193, ANC of 2.8.   ASSESSMENT AND PLAN:  Lydia King is a 77 year old female with  history of hypertension, hyperlipidemia, recently diagnosed with  multiple posterior circulation infarcts on September 05, 2008, presenting  to the ED with severe hyponatremia.  1. Hyponatremia, likely multifactorial secondary to hypovolemia      secondary to emesis and episode of diarrhea as well as the use of      thiazide diuretics and antidepressants versus a question of      questionable SIADH.  Will admit the patient to the ICU.  Check a      TSH.  Check chest x-ray.  Check a random cortisol level.  Check a      fractional excretion of sodium.  Check a urine osmolality.  Check a      plasma osmolality.  Check a magnesium level.  Check an MRI of the      head.  Check a UA with cultures and sensitivities.  Place on IV      fluids and follow.  Will hold diuretics and blood pressure      medications as well as Loxitane  for now.  If no improvement or      worsening of the patient's hyponatremia and hyponatremia, may need      a endocrinology consult versus a renal consult.  2. Emesis/vertigo, likely secondary to posterior circulatory      cerebrovascular accidents recently.  Will repeat MRI of the head,      to follow-up on  a CVA and press, and will consult with neurology in      the morning.  3. Recent posterior circulatory infarctions.  Repeat MRI of the head      and neurology consult in the morning.  Continue Plavix. 4.      Hypertension:  Hold blood pressure medications secondary to problem      #1.  4. Depression:  Hold fluoxetine secondary to problem #1.  5. Hyperlipidemia:  Continue fish oil.  6. Gastroesophageal reflux disease:  Omeprazole.  7. Obstructive sleep apnea.  8. Charcot-Marie-Tooth with bilateral foot drop, stable.  9. Prophylaxis:  Omeprazole for gastrointestinal prophylaxis.  Lovenox      for deep venous thrombosis prophylaxis.      Ramiro Harvest, MD  Electronically Signed     DT/MEDQ  D:  09/21/2008  T:  09/21/2008  Job:  [id number]   cc:   Marne A. Milinda Antis, MD  Marolyn Hammock. Thad Ranger, M.D.  Pramod P. Pearlean Brownie, MD

## 2010-06-11 NOTE — Discharge Summary (Signed)
Lydia King, Lydia King              ACCOUNT NO.:  [id number]   MEDICAL RECORD NO.:  [id number]          PATIENT TYPE:  INP   LOCATION:  3709                         FACILITY:  [facility]   PHYSICIAN:  Lonia Blood, M.D.       DATE OF BIRTH:  1930/04/01   DATE OF ADMISSION:  09/05/2008  DATE OF DISCHARGE:  09/11/2008                               DISCHARGE SUMMARY   PRIMARY CARE PHYSICIAN:  Marne A. Tower, MD   DISCHARGE DIAGNOSES:  1. Multiple posterior circulation infarcts, question due to      hypertensive emergency versus strokes.  Embolic workup negative.  2. Hyperlipidemia.  3. Hypertension.  4. Gastroesophageal reflux disease.  5. History of deep venous thrombosis.  6. Obstructive sleep apnea.  7. Osteoarthritis.   DISCHARGE MEDICATIONS:  1. Triamterene/hydrochlorothiazide 75/50 mg daily.  2. Fish oil 1200 mg daily.  3. Fluoxetine 20 mg daily.  4. Lisinopril 20 mg twice a day.  5. Omeprazole 20 mg daily.  6. Carvedilol 6.25 mg twice a day.  7. Norvasc 5 mg daily.  8. Plavix 75 mg daily.   CONDITION ON DISCHARGE:  Lydia King is discharged in fair condition.  She will follow up with Dr. Roxy Manns within a week and with Dr. Pearlean Brownie  from [facility].   PROCEDURES DURING THIS ADMISSION:  1. The patient underwent a MRI of the brain on September 05, 2008, with      findings of diffuse moderate-to-severe intracranial      atherosclerosis, most pronounced in the posterior circulation,      question severe acute posterior reversible encephalopathy syndrome      with progression to infarcts.  2. On  September 08, 2008, MRI of the brain with findings of extensive      areas of restricted diffusion involving the occipital lobes,      posterior temporal lobe, cerebellum, left thalamus compatible with      areas of acute nonhemorrhagic infarction.  Distribution is similar      to that of posterior reversible encephalopathy.  The restricted      diffusion images  suggest that the areas are unlikely to be      reversible.  3. Echocardiogram on September 06, 2008, findings of the ejection      fraction 60-65%, calcification of the mitral valve, minimal      pulmonary artery pressure elevation, no cardiac source of embolus      noted.  4. On September 11, 2008, transesophageal echocardiogram.  Findings of      left ventricular normal size, no evidence of thrombus in the atrial      cavity or appendage.  Echo contrast study showed no right to left      atrial level shunt.  5. Carotid ultrasound. Results pending.   CONSULTATION THIS ADMISSION:  The patient was seen by the [facility].   HISTORY AND PHYSICAL:  Refer to the written H and P which is available  in the chart done by Dr. Rene Paci.  Briefly, Lydia King, a 77-  year-old woman with history of hypertension,  presented with severe  headaches and neurological deficits.  She was admitted with acute  posterior circulation stroke and was seen by Neurology in the emergency  room.   HOSPITAL COURSE:  Lydia King is a 77 year old woman with hypertension  presenting with severe headache and changes of posterior circulation  stroke.  The workup suggested that stroke was a result of severe  posterior reversible encephalopathy syndrome due to elevation in blood  pressure.  Lydia King was treated with increase in her lisinopril,  addition of beta-blocker and Norvasc to her diuretic, and she did  respond nicely with control of her high blood pressure and headaches.  The patient also had diligent investigation for possibility of embolic  sources without any clear findings.  She does have atherosclerosis in  the posterior circulation for which we have started her on Plavix, and  she will also continue the fish oil and follow up with her primary care  physician in the outpatient setting.  The patient will also follow up  with her neurologist, Dr. Pearlean Brownie in the  office.      Lonia Blood, M.D.  Electronically Signed     SL/MEDQ  D:  09/11/2008  T:  09/12/2008  Job:  [id number]   cc:   Marne A. Milinda Antis, MD

## 2010-06-11 NOTE — Discharge Summary (Signed)
Lydia King, Lydia King              ACCOUNT NO.:  [id number]   MEDICAL RECORD NO.:  [id number]          PATIENT TYPE:  INP   LOCATION:  3008                         FACILITY:  [facility]   PHYSICIAN:  Bruce Rexene Edison. Swords, MD    DATE OF BIRTH:  10-01-1930   DATE OF ADMISSION:  09/21/2008  DATE OF DISCHARGE:  09/25/2008                               DISCHARGE SUMMARY   PRIMARY CARE PHYSICIAN:  Marne A. Tower, MD   DISCHARGE DIAGNOSES:  1. Hyponatremia.  2. Altered mental status.  3. Escherichia coli urinary tract infection.  4. Recurrent scattered posterior circulation infarctions.   HISTORY OF PRESENT ILLNESS:  Lydia King is a 77 year old white female  with multiple medical problems who was recently hospitalized on September 05, 2008, through September 11, 2008, due to multiple posterior circulation  infarcts secondary to PRES syndrome who presented to the emergency room  on day of admission with reports of nausea, vomiting, chills, global  weakness, and dizziness.  Upon evaluation in the emergency room, the  patient was found to have a sodium of 110 and a chloride of 96.  The  patient was admitted at that time for further evaluation and treatment.   PAST MEDICAL HISTORY:  1. Recent multiple posterior circulation infarct, September 05, 2008,      felt secondary to PRES syndrome.  2. Hyperlipidemia.  3. Depression.  4. Hypertension.  5. GERD.  6. History of DVT in 2003 after knee replacement.  7. Obstructive sleep apnea.  8. Osteoarthritis.  9. Osteopenia  10.History of shingles.  11.History of peptic ulcer disease.  12.History of migraines  13.Diverticulosis.  14.Charcot-Marie-Tooth disease with bilateral footdrop.  15.History of blood loss anemia after hiatal hernia surgery.  16.Status post hiatal hernia repair in 2000.  17.Bilateral knee replacement in 2003 and 2006.  18.Status post hysterectomy in 1964.   CONSULTATION DURING THIS ADMISSION:  [facility],  Dr.  Noel Christmas.   COURSE OF HOSPITALIZATION.:  1. Significant hyponatremia.  Felt multifactorial in nature given      hypovolemia with vomiting and episode of diarrhea prior to      admission.  Also, the patient on thiazide diuretic and      antidepressants versus questionable SIADH.  The patient's sodium      has responded well to holding medications as well as IV fluid      hydration.  We will continue thiazide diuretic.  At the time of      discharge, the patient and family are instructed to have the      patient's sodium rechecked with primary care physician this week.  2. Posterior circulation CVAs.  MRI of the brain done this admission      reveals evolution of multiple CVAs with increase in new area of      infarction in cerebellar and occipital lobes.  Neurology was asked      to see the patient in consultation who recommended continuing      Plavix.  No indication seen for repeating MRA of the brain or      carotid Dopplers as  the patient did undergo a thorough workup      earlier this month with previous hospitalization.  3. E. coli UTI.  The patient completed course of Cipro during      hospitalization.   MEDICATIONS AT TIME OF DISCHARGE:  1. Plavix 75 mg p.o. daily.  2. Omeprazole 20 mg p.o. daily.  3. Coreg 6.25 mg p.o. b.i.d.  4. Amlodipine 5 mg p.o. daily.  5. Lovaza 1 g p.o. daily.  6. Fluoxetine 20 mg p.o. daily.  The patient is instructed to discontinue Dramamine, lisinopril, and  triamterene and hydrochlorothiazide until follow up with primary care  physician.   PERTINENT LABORATORY WORK AT TIME OF DISCHARGE:  Sodium 135, potassium  3.4, BUN 9, and creatinine 0.63.  Random cortisol 14.5.  TSH 1.051.  Urine culture positive for greater than 100,000 colonies E. coli  sensitive to Cipro.   DISPOSITION:  The patient felt medically stable for discharge home at  this time.  The patient is instructed to follow up with Dr. Roxy Manns  this week for recheck of  sodium.      Lydia Pen, NP      Valetta Mole. Swords, MD  Electronically Signed    LE/MEDQ  D:  09/25/2008  T:  09/26/2008  Job:  [id number]   cc:   Marne A. Milinda Antis, MD

## 2010-06-11 NOTE — Consult Note (Signed)
NAMEPARRISH, Lydia King              ACCOUNT NO.:  [id number]   MEDICAL RECORD NO.:  [id number]          PATIENT TYPE:  INP   LOCATION:  3709                         FACILITY:  [facility]   PHYSICIAN:  Casimiro Needle L. Reynolds, M.D.DATE OF BIRTH:  11/23/1930   DATE OF CONSULTATION:  09/05/2008  DATE OF DISCHARGE:                                 CONSULTATION   REQUESTING PHYSICIAN:  Valerie A. Felicity Coyer, MD   REASON FOR EVALUATION:  Possible stroke.   HISTORY OF PRESENT ILLNESS:  This is the initial inpatient consultation  evaluation of this 77 year old woman with a past medical history which  includes hypertension, hyperlipidemia, and migraine headaches.  She  states that she was in her usual state of pretty good health until  Thursday, when she developed an unusually severe and persistent  headache.  Around that time, her blood pressure started to increase, and  she noticed on Friday, 4 days ago, it was going up over 180.  Then  Saturday, 3 days ago, she noticed visual symptoms described as small  spots moving across her vision.  She denied any large loss of visual  field, but had difficulty seeing particular features of daises.  Her  visual symptoms and headache have remained basically stable since that  time.  Her blood pressure continued to go up, and was as high as 203  systolic in the last 1-2 days.  She was seen at [facility] and  also in an urgent care, and that was treated symptomatically.  She then  had an MRI done at [facility] earlier today, which was read out  showing bilateral occipital hyperintensities, possible stroke versus  PRES syndrome.  She was brought to the emergency department of [facility], where she had further DWI imaging as well as MRA and MRV imaging.  DWI demonstrates hyperintensities throughout the bilateral occipital  areas, along with some hyperintensities in the left cerebellum, which  are mostly confluent rather than patchy, and there is some  restriction  diffusion on the ADC map.  Full MRI was not done.  MRV showed no venous  occlusions.  MRA showed diffuse narrowing of vessels, but no occlusion  of any major vessel.  She is admitted to the hospital for further  evaluation and management.  Neurology consultation is requested.  The  patient, at this time, says that she is stable.  She denies ever at any  point having any localizing numbness or weakness, dysarthria, dysphagia.   PAST MEDICAL HISTORY:  Hypertension and hyperlipidemia.  No known  history of stroke.  She has a history of migraines and has taken Imitrex  in the past.  She has a history of Charcot-Marie-Tooth disease,  diagnosed about 24 years ago, with some weakness in the feet.  She had  bilateral knee replacements, and had a DVT after the knee replacement in  2003, was on Coumadin for several months.   FAMILY/SOCIAL/REVIEW OF SYSTEMS:  Per admission H and P.   MEDICATIONS:  Dyazide, fluoxetine 20 mg daily, lisinopril, fish oil,  flaxseed oil, omeprazole, tramadol p.r.n.   PHYSICAL EXAMINATION:  VITAL SIGNS:  Temperature 97.5, blood pressure  154/71, pulse 73, respirations 20, O2 sat 94% on room air.  GENERAL:  This is a healthy-appearing woman, supine in hospital, in no  distress.  HEENT:  Head, cranium is normocephalic and atraumatic.  Oropharynx is  benign.  NECK:  Supple with a left supraclavicular bruit.  HEART:  Regular rate and rhythm without murmurs.  NEUROLOGIC:  Mental status, she is awake and alert.  She is oriented to  time, place, and person.  Recent and remote memory are adequate.  Attention span, concentration, and fund of knowledge are all  appropriate.  Speech is fluent and not dysarthric.  Cranial nerves,  pupils are equal and reactive.  Extraocular movements are full without  nystagmus.  Examination of visual fields is pretty good, demonstrating  ability to count fingers and receive movement in all visual fields,  perhaps a little bit of  neglect in the left lower, but this is  inconsistent.  Face, tongue, and palate move normally and symmetrically.  Motor, normal bulk and tone.  She has bilateral foot drops and distal  weakness in the feet, otherwise normal strength in all tested extremity  muscles.  Sensation intact to pinprick and double stimulation in all  extremities.  Coordination, rapid movements performed well.  Finger-to-  nose and heel-to-shin performed well.  Gait is deferred.  Reflexes are  1+ in the upper extremities and absent in the lower extremities.  Toes  are downgoing bilaterally.   LABORATORY REVIEW:  CBC is unremarkable except for slightly low platelet  count.  CMET is unremarkable except for a glucose of 0.91.  Coags are  normal.  MRI of the brain performed earlier today at [facility],  as well as MRA and MRV and limited diffusion and ADC mapping performed  today at [facility], are personally reviewed and discussed  extensively with Dr. Margo Aye, the neuroradiologist on call.  The primary  findings are hyperintensities which are fairly large and confluent  throughout, both occipital lobes, and also in the left cerebellar  hemisphere, primarily in the white matter of the hemisphere.  These are  consistent with flare and P2 image alterations on the MRI.  As noted  above, there is some darkening on the [facility] mapping, raising the  possibility of acute infarct.  However, changes of PRES are not  excluded.  The MRV demonstrates no venous obstruction.  The MRA  demonstrates diffuse atherosclerotic vessels, but no vascular occlusion.   IMPRESSION:  1. Visual changes and abnormal MRI.  Her examination looks whole lot      better than her scan as I would expect cortical blindness with the      degree of disease on MRI if the disease representing infarct.      Given that, and the fact that her systolic blood pressures have      been elevated in the last 1-2 days, I suspect that the changes are       related to posterior reversible encephalopathy syndrome rather than      infarct.  However, infarct remains a possibility.  2. Headache which is probably a combination of migraine and the      factors identified above.  3. Charcot-Marie-Tooth disease with bilateral foot drops, stable.   PLAN:  Agree with antiplatelet therapy, echo, and Dopplers.  We would  treat blood pressure aggressively, aiming for a systolic blood pressure  of less than 160.  I think the most useful thing over the next  few days  will be her clinical recovery and a followup scan to check the degree of  change, if any, in the MRI abnormalities.  We will follow with you.  Thank you for the consultation.      Michael L. Thad Ranger, M.D.  Electronically Signed     MLR/MEDQ  D:  09/05/2008  T:  09/06/2008  Job:  [id number]   cc:   Marne A. Milinda Antis, MD

## 2010-06-11 NOTE — Consult Note (Signed)
Lydia King, Lydia King              ACCOUNT NO.:  [id number]   MEDICAL RECORD NO.:  [id number]          PATIENT TYPE:  INP   LOCATION:  3008                         FACILITY:  [facility]   PHYSICIAN:  Noel Christmas, MD    DATE OF BIRTH:  08-24-1930   DATE OF CONSULTATION:  09/23/2008  DATE OF DISCHARGE:                                 CONSULTATION   REASON FOR CONSULTATION:  Altered mental status and MRI.   INDICATIONS:  Recurrent acute strokes.   HISTORY OF PRESENT ILLNESS:  This is a 77 year old lady with a history  of recent hospitalization for multiple bilateral occipital and  cerebellar hemispheric small infarctions as well as left thalamic small  infarction.  The patient presented with visual changes and headache.  Her workup at that time showed unremarkable echocardiogram, TEE showed  no signs of right to left shunting phenomenon, carotid Doppler study and  MRA of the brain were unremarkable.  The patient was given Plavix 75 mg  per day.  She presented again on September 21, 2008, with altered mental  status as well as history of blurring of vision, which tended to occur  primarily in the afternoons.  She was found to have markedly low serum  sodium (107).  Laboratory studies were otherwise unremarkable.  Repeat  MRI of her brain yesterday showed old lesions as described above.  In  addition, new areas of acute infarction were seen in both right and left  occipital lobes as well as right and left cerebral hemispheres.  The  patient's sodium this morning was up to 119.  The patient has markedly  improved since admission.  Confusion has improved markedly as well.  The  patient had no deterioration of her vision in the interim between  discharge from previous hospitalization and readmission currently.  She  has been compliant with taking Plavix daily.   PAST MEDICAL HISTORY:  Remarkable for:  1. Multiple posterior circulation infarctions.  2. Hyperlipidemia.  3. Depression.  4.  Hypertension.  5. Gastroesophageal reflux disease.  6. Deep venous thrombosis in 2003, associated with the knee      replacement.  7. Obstructive sleep apnea.  8. Osteoarthritis.  9. Osteopenia.  10.Hiatal hernia repair.  11.Charcot-Marie-Tooth disease involving both lower extremities.  12.Diverticulosis.  13.Migraine headaches.  14.Peptic ulcer disease.   MEDICATIONS:  1. Fish oil 1200 mg per day.  2. Fluoxetine 20 mg daily.  3. Lisinopril 20 mg b.i.d.  4. Prilosec 20 mg per day.  5. Coreg 6.25 mg twice a day.  6. Norvasc 5 mg per day.  7. Plavix 75 mg per day.  8. Dramamine 25 mg t.i.d.   FAMILY HISTORY:  Positive for Charcot-Marie-Tooth disease involving  multiple generations.  Family history is also positive for stroke as  well as intracranial aneurysm involving one son as well as her father.   PHYSICAL EXAMINATION:  Appearance was that of an elderly lady who was  moderately overweight and of short stature.  She was alert and  cooperative, and in no acute distress.  She was well-oriented to time as  well as place.  Short-term and long-term memory were intact and normal.  She had no indications of receptive nor expressive aphasia.  Pupils,  extraocular movements and visual fields were normal.  There was no  facial weakness.  Hearing and speech were normal.  Strength and muscle  tone were normal, except for mild intrinsic hand muscle weakness and  reduced handgrip bilaterally as well as distal weakness and deformities  of lower extremities consistent with Charcot-Marie-Tooth disease.  Deep  tendon reflexes 1+ in the upper extremities and absent in the lower  extremities.  Plantar responses were flexor.  Sensory exam was normal  including vibratory sensation distally in her feet.  Carotid  auscultation was normal.   CLINICAL IMPRESSION:  1. Mental status changes, most likely secondary to hyponatremia,      essentially resolved with improvement in serum sodium level.  2.  Recurrent scattered posterior circulation infarctions involving      occipital lobes and cerebellar hemispheres, of unclear source.  No      evidence of a cardiac source or emboli was seen on previous workup      including transesophageal echocardiogram.  3. Charcot-Marie-Tooth disease, stable.   RECOMMENDATIONS:  1. Continue Plavix 75 mg per day.  2. Rehab intervention for evaluation of gait for safety reasons.  No      indication for speech therapy and occupational therapy.  3. No indications for repeat MRI of the head nor carotid Doppler study      or echocardiogram.   Thank you for asking me to evaluate Ms. Cueva.      Noel Christmas, MD  Electronically Signed     CS/MEDQ  D:  09/23/2008  T:  09/24/2008  Job:  [id number]

## 2010-06-14 NOTE — Discharge Summary (Signed)
NAME:  Lydia King, Lydia King                        ACCOUNT NO.:  [id number]   MEDICAL RECORD NO.:  [id number]                   PATIENT TYPE:  INP   LOCATION:  5040                                 FACILITY:  [facility]   PHYSICIAN:  Daniel L. Thomasena Edis, M.D.             DATE OF BIRTH:  Jun 13, 1930   DATE OF ADMISSION:  09/17/2001  DATE OF DISCHARGE:  09/29/2001                                 DISCHARGE SUMMARY   DISCHARGE DIAGNOSES:  1. Left total knee replacement August 19.  2. Postoperative left popliteal deep vein thrombosis.  3. Coumadin therapy for deep vein thrombosis.  4. Anemia.  5. Charcot-Marie-Tooth syndrome.  6. Mitral valve prolapse.  7. Hypertension.  8. History of migraine headaches.  9. Gastroesophageal reflux disease.   HISTORY OF PRESENT ILLNESS:  A 77 year old white female with history of  Charcot-Marie-Tooth syndrome with 15-year progressive bilateral knee pain,  left greater than right.  X-rays of end-stage osteoarthritis.  No relief  with conservative care.  She underwent a left total knee replacement August  19 per Dr. Cleophas Dunker.  Placed on Coumadin for deep vein thrombosis  prophylaxis and partial weightbearing.  Postoperative increased swelling of  the left leg.  Venous Doppler study August 21 was positive for left deep  vein thrombosis to the popliteal vein.  Intravenous heparin was added to  Coumadin therapy at that time.  She was maximum assistance for bed mobility  and moderate assist transfers.  Latest INR of 1.5, hemoglobin 10.1.  Chest x-  ray negative.  She was admitted for a comprehensive rehab program.   PAST MEDICAL HISTORY:  See discharge diagnoses.   HABITS:  No alcohol or tobacco.   ALLERGIES:  1. PENICILLIN.  2. SULFA.  3. CODEINE.   PAST SURGICAL HISTORY:  Hernia repair, hysterectomy, cholecystectomy, and  midfoot fusion.  Her primary M.D. is Dr. Juel Burrow at [phone].   MEDICATIONS PRIOR TO ADMISSION:  1. Prilosec.  2. Ogen.  3. Prozac.  4. Zestril.  5. Zocor.  6. Allegra.  7. Maxzide.  8. Iron supplement.  9. Arthrotec.   SOCIAL HISTORY:  Lives alone, retired, one-level apartment.  No steps to  entry.  Local family works.  She used a cane and/or walker occasionally  prior to admission.   HOSPITAL COURSE:  The patient did well while on rehabilitation services with  therapies initiated daily.  The following issues were followed during the  patient's rehab course.  Pertaining to the patient's left total knee  replacement, surgical site healing nicely.  No signs of infection.  She was  partial weightbearing with knee precautions as advised.  Surgical site had  Steri-Strips in place.  She would follow up with Dr. Cleophas Dunker.  Noted  findings per venous Doppler study August 21 showed a left popliteal deep  vein thrombosis.  Due to these findings, she would remain on Coumadin  therapy for three months with routine followup blood  work followed by her  primary M.D., Dr. Juel Burrow.  A home health nurse would be arranged per [facility].  Postoperative hemoglobin remained stable at 10.2,  hematocrit 30.6.  She had been on iron supplement in the past.  Blood  pressures controlled with Zestril and Maxzide.  There were no orthostatic  changes.  She would continue on her Prilosec as prior to hospital admission  for gastroesophageal reflux disease.  There were no bowel or bladder  disturbances.  She was treated for a Klebsiella urinary tract infection  during her rehabilitation stay with Tequin.  Overall, for her function and  mobility, she was ambulating extended household distances with a walker.  Essentially independent standby assist in all areas of activities of daily  living of dressing, grooming, and homemaking.  Overall, her strength and  endurance had greatly improved and she was encouraged of her overall  progress.  She would be discharged to home with home health physical and  occupational therapy, as well as a  nurse.   LABORATORY DATA:  Latest labs showed an INR of 2.8.  Hemoglobin 10.2,  hematocrit 30.6.  Sodium 141, potassium 4.2, BUN 8, creatinine 0.5.   DISCHARGE MEDICATIONS:  1. Coumadin daily with dose to be established at time of discharge; latest     dose of 2.5 mg.  2. Ogen 0.625 mg daily.  3. Prozac 20 mg daily.  4. Zestril 10 mg daily.  5. Zocor 40 mg daily.  6. Maxzide Monday, Wednesday, and Friday.  7. Prilosec 20 mg daily.  8. Ultram 50 mg as needed for pain.  9. Tylenol as needed.  10.      Arthrotec 75 mg 1 tablet daily.   ACTIVITY:  Partial weightbearing.   DIET:  Regular.    SPECIAL INSTRUCTIONS:  No aspirin or ibuprofen while on Coumadin.  Home  health nurse per [facility] weekly.  Results to Dr. Juel Burrow at  [phone].  She should follow up with Dr. Cleophas Dunker of orthopedics services,  call for an appointment.      Lydia King. Angiulli, P.A.                  Daniel L. Thomasena Edis, M.D.    DJA/MEDQ  D:  09/28/2001  T:  09/28/2001  Job:  [id number]   cc:   Dr. Juel Burrow  [phone]   Claude Manges. Cleophas Dunker, M.D.

## 2010-06-14 NOTE — H&P (Signed)
NAMEROCKELL, FAULKS              ACCOUNT NO.:  [id number]   MEDICAL RECORD NO.:  [id number]          PATIENT TYPE:  ORB   LOCATION:  4525                         FACILITY:  [facility]   PHYSICIAN:  Erick Colace, M.D.DATE OF BIRTH:  08-23-1930   DATE OF ADMISSION:  11/12/2004  DATE OF DISCHARGE:                                HISTORY & PHYSICAL   REASON FOR ADMISSION:  Decreased self care and mobility, status post right  TKA.   HISTORY OF PRESENT ILLNESS:  A 77 year old female with a history of left  total knee arthroplasty -2003.  She was at [facility] from September 17, 2001 to  September 29, 2001.  She was admitted, on November 07, 2004, with end stage  changes of the right knee without relief using conservative care.  She  underwent at right TKA, on November 07, 2004, per Dr. Norlene Campbell and  placed on Coumadin for DVT prophylaxis and 50% partial weightbearing  postoperatively.  Pain control was achieved through PCA and this was  discontinued on November 09, 2004.  Ultrasound  Duplex lower extremity,  November 09, 2004, negative.  Anemia, postoperatively hemoglobin 9.6 is being  monitored.   REVIEW OF SYSTEMS:  Positive for reflux as well as nausea as well as joint  swelling and depression.   PAST HISTORY:  1.  Sleep apnea, uses CPAP.  2.  Obesity.  3.  IBS.  4.  Diverticulosis.  5.  Mitral valve prolapse.  6.  GERD.  7.  History of DVT following her left TKA in 2003.  8.  Charcot-Marie-Tooth disease.  9.  Depression.   SOCIAL HISTORY:  Lives alone in [place], one-level home, zero steps to  enter, local family works.   FUNCTIONAL HISTORY:  Prior to admission independent walker, was driving.  She needs assist with her activities of daily living and mobility currently.   MEDICATIONS PRIOR TO ADMISSION:  1.  Nexium 40 mg p.o. every day.  2.  Triamterene hydrochlorothiazide 75/50 every day.  3.  Prozac 25 every day.  4.  Arthrotek 75 p.o. every day.  5.  Claritin one  p.o. every day.   ALLERGIES:  1.  PENICILLIN.  2.  SULFA.  3.  CODEINE.  4.  TRAMADOL.   Last INR, November 12, 2004, was 1.9.   PHYSICAL EXAMINATION:  VITAL SIGNS:  Blood pressure 125/79, pulse 92,  respirations 18, temp 98 degrees.  GENERAL APPEARANCE:  An obese female in no acute distress.  HEENT:  Eyes anicteric.  Not injected.  External ENT normal.  NECK:  Supple without adenopathy.  LUNGS:  Respiratory effort is good.  Lungs are clear to auscultation.  HEART:  Regular rate and rhythm.  No murmurs, rubs, extra sounds.  ABDOMEN:  Positive bowel sounds.  Soft, nontender to palpation.  EXTREMITIES:  No clubbing, cyanosis, or edema.  NEUROLOGIC:  Orientation x3.  Mood, memory, affect are all normal.   IMPRESSION:  1.  Right total knee arthroplasty secondary to degenerative joint disease,      postoperative day number five with decreased self care, mobility skills.  Pain management with Vicodin as needed, Robaxin as needed.  Coumadin per      pharmacy protocol for deep vein thrombosis prophylaxis.  2.  Postoperative anemia.  Follow up on CBC.  3.  Hypertension.  Continue Maxzide.  4.  Depression.  Continue Prozac.  5.  History of left total knee arthroplasty, 2003.  6.  Sleep apnea.  Continue CPAP.   ESTIMATED LENGTH OF STAY:  Seven to ten days.   The patient is a good rehab candidate.      Erick Colace, M.D.  Electronically Signed     AEK/MEDQ  D:  11/12/2004  T:  11/12/2004  Job:  [id number]   cc:   Claude Manges. Cleophas Dunker, M.D.  Fax: [phone]

## 2010-06-14 NOTE — Discharge Summary (Signed)
[name], King              ACCOUNT NO.:  [id number]   MEDICAL RECORD NO.:  [id number]          PATIENT TYPE:  INP   LOCATION:  5015                         FACILITY:  [facility]   PHYSICIAN:  Claude Manges. Whitfield, M.D.DATE OF BIRTH:  07-27-1930   DATE OF ADMISSION:  11/07/2004  DATE OF DISCHARGE:  11/12/2004                                 DISCHARGE SUMMARY   ADMISSION DIAGNOSES:  1.  End-stage osteoarthritis right knee.  2.  History of left knee replacement in 2003; with deep vein thrombosis      postoperatively.  3.  Sleep apnea.  4.  Mitral valve prolapse.  5.  Charcot-Marie Tooth disease.  6.  Gastroesophageal reflux disease.  7.  Migraines.  8.  Seasonal allergies.  9.  Multiple drug allergies.  10. History of hiatal hernia.   DISCHARGE DIAGNOSES:  1.  End-stage osteoarthritis right knee, status post right total knee      arthroplasty.  2.  Acute blood loss anemia secondary to surgery.  3.  Probable sinusitis.  4.  Poor pain control, now under good control.  5.  History of left total knee arthroplasty in 2003. with postoperative deep      vein thrombosis .  6.  Sleep apnea.  7.  Mitral prolapse.  8.  Charcot-Marie tooth disease.  9.  Gastroesophageal reflux disease.  10. Migraines.  11. She saw allergies.  12. Multiple drug allergies.  13. History of hiatal hernia.   SURGICAL PROCEDURES:  On November 07, 2004 Lydia King underwent a right  total knee arthroplasty by Dr. Claude Manges. Whitfield, assisted by Arnoldo Morale,  PA-C. She had an LCS complete primary femoral component, cemented size  medium right; with an LCS complete RP insert size medium 10 mm thickness;  and a DePuy NBP keel tibial tray cemented size 2. An LCS complete metal-  backed patella cemented size medium.   COMPLICATIONS:  None.   CONSULTS:  1.  Pharmacy consult for Coumadin therapy November 07, 2004.  2.  Case management and physical therapy consult November 08, 2004.  3.  Occupational therapy  consult November 09, 2004.  4.  Rehabilitation medicine consult November 08, 2004.   HISTORY OF PRESENT ILLNESS:  This 77 year old __________ female patient  presented to Lydia King with a history of right knee pain for the last 35  years. She had left knee replacement done in 2003, but developed a  postoperative DVT. Over the last 10 years however, the right knee pain is  now getting worse. The left knee is now doing well.   At this point, the right knee pain is a burning, dull, throbbing constant  pain; diffuse about the joint. The knee does give way and catches. She has  failed conservative treatment and x-rays show arthritic changes. Because of  that she is presenting for right knee replacement.   HOSPITAL COURSE:  Lydia King tolerated her surgical procedure well  without immediate postoperative complications. She was transferred to 5000.   On Postop day #1 she was afebrile and vitals stable. Hemoglobin 11.3,  hematocrit 33.1. Pain was controlled with  medications. She was kept on  Lovenox until her Coumadin was therapeutic, and she was complaining of some  discomfort due to the pain medications; and so adjustments were made and  they were effective.   On Postop day #2, T-max 100.6, hemoglobin 10.4, hematocrit 30.4. Incision  was well-approximated with staples. She was started on therapy per protocol.  She did have increasing leg pain that day and with her history of DVT, a  Doppler was obtained to rule out DVT.  This Doppler was negative for any  DVT, SVT or Baker's cyst.   She continued to do well over the next several days. Pain became much better  controlled. She did complain of some sinus congestion, drainage and has also  frontal  and maxillary sinus pain. It is felt she might have a sinus  infection, so on October 17 she was started on a Z-Pak. She has run a low-  grade temperature of 99.4 to 99.7; but the knee incision looks good. It is  felt she is stable for  transfer to the rehabilitation unit today, and will  be transferred there later today.   DISCHARGE INSTRUCTIONS:  Diet: She can continue her current hospitalization  diet.   MEDICATIONS:  Continue her current hospitalization medications. These  include:  1.  Colace 100 mg p.o. b.i.d..  2.  Senokot 1 tablet p.o. b.i.d. a.c.  3.  Coumadin 1 tablet, with the dose to be adjusted by pharmacy to keep the      INR about 2.0 at 6:00 p.m.. This was continued for 1 month.  4.  Protonix 40 mg p.o. q.a.m..  5.  Claritin 10 mg p.o. q.a.m..  6.  Maxzide 75/50 mg 1 tablet p.o. q.a.m.Marland Kitchen  7.  Prozac 20 mg p.o. q.a.m..  8.  Enema of choice and laxative of choice p.r.n..  9.  Tylenol 1-2 tablets p.o. q.4 h p.r.n. for pain or temperature.  10. Robaxin [phone] mg p.o. q.6 h p.r.n. for spasms.  11. Albuterol multi dose inhaler 2.5 mg inhaled q.6 h. p.r.n. congestion.  12. Dilaudid 2 mg 1-2 tablets p.o. q.4 h p.r.n. for pain.  13. Azithromycin 500 mg p.o. on October 17 and then 250 mg p.o. q.a.m. for 5      days.   ACTIVITY:  She can be out of bed with partial weightbearing 50% or less on  the right leg, with the use of walker. She is to continue CPM 0-90 degrees 6-  8 hours a day, and home health, PT and OT per rehabilitation protocol.   WOUND CARE:  Please clean the right knee incision with Betadine q.d. and  apply a dry dressing. Please notify Lydia King of temperature greater  than 101.5, chills, pain unrelieved by pain medications or foul-smelling  drainage from the wound. Staples can be discontinued on postop day 14 if she  is still in rehabilitation; otherwise she needs to follow up with [place] at that  time for staple removal on first postoperative check.   FOLLOW-UP:  She is to follow-up with Lydia King in our office at about  postop day 14, if staples are still in place and she has been discharged from rehabilitation.  Or, she needs to follow up with [place] a week or 2 after  discharge from  rehabilitation, if the staples are discharged while she is in  rehabilitation.   LABORATORY DATA:  Hemoglobin/hematocrit ranged from 14.4 and 42.9 on the  November 05, 2004 to 9.6 and 27.4 on  November 10, 2004. Platelets ranged from  162 on November 05, 2004, to a low of 121 on November 09, 2004.   PT and INR on October 10:  PT 12.3, INR 0.9; and then on October 17, PT 21.5  and INR 1.9   Sodium dropped to a low of 131 on October 14. Glucose ranged from 97-132.  BUN ranged from 4-12. All other laboratory studies were within normal  limits.      Lydia King, P.A.      Claude Manges. Cleophas King, M.D.  Electronically Signed    KED/MEDQ  D:  11/12/2004  T:  11/12/2004  Job:  [id number]

## 2010-06-14 NOTE — Op Note (Signed)
NAME:  Lydia King, Lydia King                        ACCOUNT NO.:  [id number]   MEDICAL RECORD NO.:  [id number]                   PATIENT TYPE:  INP   LOCATION:  2899                                 FACILITY:  [facility]   PHYSICIAN:  Claude Manges. Cleophas Dunker, M.D.            DATE OF BIRTH:  10/26/1930   DATE OF PROCEDURE:  09/14/2001  DATE OF DISCHARGE:                                 OPERATIVE REPORT   PREOPERATIVE DIAGNOSIS:  End-stage osteoarthritis, left knee.   POSTOPERATIVE DIAGNOSIS:  End-stage osteoarthritis, left knee.   PROCEDURE:  Left total knee arthroplasty.   SURGEON:  Claude Manges. Cleophas Dunker, M.D.   ASSISTANT:  Jamelle Rushing, P.A.   ANESTHESIA:  General orotracheal.   COMPLICATIONS:  None.   COMPONENTS:  DePuy LCS Complete medium femoral component, #2 rotating tibial  platform with 12.5 mm bridging bearing, and a metal-backed three-pegged  design rotating patella.  All were secured with polymethyl methacrylate.   DESCRIPTION OF PROCEDURE:  With the patient comfortable on the operating  table and under general orotracheal anesthesia, the nursing staff inserted a  Foley catheter.  The left lower extremity was then placed in a thigh  tourniquet.   The left lower extremity was then prepped with Betadine and scrubbed with  Duraprep and with the tourniquet to the midfoot, sterile draping was  performed.  With the extremity still elevated, it was Esmarch-exsanguinated  with the tourniquet at 350 mmHg.   A midline longitudinal incision was made centered over the patella,  extending from the superior pouch to the tibial tubercle.  By sharp  dissection, the incision was carried down through the subcutaneous tissue  and abundant adipose tissue.  The first layer of the capsule was incised in  the midline.  A median parapatellar incision was then made through the deep  capsule.  There was approximately 30 cc of clear yellow joint effusion.  The  patella was everted 180 degrees and the  knee flexed  to 90 degrees.  There  was complete loss of articular cartilage along the lateral femoral condyle  with the valgus position of the knee that I could correct to neutral.  There  was about 80% loss of articular cartilage of the medial femoral condyle with  large osteophytes on both sides.  The patella was thin with large  osteophytes circumferentially and without articular cartilage in about 75%  of the joint surface.   Preoperatively we had measured either a small plus or a medium femoral  component.  The medial component was confirmed intraoperatively.  We also  confirmed the #2 rotating tibial platform.   Retractors were then inserted to protect the collateral ligaments.  The  tibial jig was then applied to make the initial tibial cut with the 7 degree  posterior inclination.  The cut was made without difficulty.  The femoral  jigs were then applied to make the appropriate cuts.  The ACL and  PCL were  sacrificed.  We used a 4 degree distal femoral valgus cut.  Flexion-  extension gaps were symmetrical at 12.5.  Laminar spreaders were inserted  along the medial and lateral compartments to remove remnants of the medial  and lateral menisci, ACL, and PCL, and osteophytes from the posterior aspect  of the femoral condyles.  There was a large Baker's cyst medially, which was  debrided.  There were osteophytes and loose bodies behind both the medial  and lateral compartments, which were also removed.  The retractors were then  inserted to make the final tibial cuts.  We trialled the #2 rotating tibial  platform with the 12.5 mm polyethylene bridging bearing and the medium  femoral component.  We had excellent range of motion without malrotation of  the tibial component.   The patella was quite thin.  It was only approximately 17 mm thick.  We  removed about 6-7 mm of bone and used the five-pegged jig to make three  holes.  The trial patella was inserted and the knee placed  through a full  range of motion.  We had a stable construct.  We did not think that we had  stopped the joint.   The knee was again placed through a full range of motion with excellent  stability.   The trial components were removed, the joint was then copiously irrigated  with jet saline lavage and antibiotic solution.  Each of the final  components were then secured with polymethyl methacrylate.  Extraneous  methacrylate was removed with a Glorious Peach.  We then ranged the knee with  excellent range of motion.  The patella was stable.  Extraneous methacrylate  was removed with an osteotome.   The joint was again irrigated with saline-antibiotic solution, the  tourniquet was deflated.  Gross bleeders were Bovie coagulated.  The joint  was relatively dry, and a Hemovac was not necessary.  The deep capsule was  closed with an interrupted #1 Tycron, the superficial capsule with a running  0 Vicryl and the subcu with 2-0 Vicryl, skin closed with skin clips.  A  sterile bulky dressing was applied, followed by a knee immobilizer.  Cliffton Asters Crews, M.D., was to perform a femoral nerve block for postoperative pain  control.                                               Claude Manges. Cleophas Dunker, M.D.    PWW/MEDQ  D:  09/14/2001  T:  09/15/2001  Job:  [id number]

## 2010-06-14 NOTE — Op Note (Signed)
Lydia King, Lydia King              ACCOUNT NO.:  [id number]   MEDICAL RECORD NO.:  [id number]          PATIENT TYPE:  INP   LOCATION:  2899                         FACILITY:  [facility]   PHYSICIAN:  Claude Manges. Whitfield, M.D.DATE OF BIRTH:  11/15/1930   DATE OF PROCEDURE:  11/07/2004  DATE OF DISCHARGE:                                 OPERATIVE REPORT   PREOPERATIVE DIAGNOSIS:  End stage osteoarthritis, right knee.   POSTOPERATIVE DIAGNOSIS:  End stage osteoarthritis, right knee.   OPERATION PERFORMED:  Right total knee replacement.   SURGEON:  Claude Manges. Cleophas Dunker, M.D.   ASSISTANT:  Legrand Pitts. Duffy, P.A.   ANESTHESIA:  General orotracheal anesthesia with supplemental femoral nerve  block.   COMPLICATIONS:  None.   COMPONENTS:  Depuy LCS medium femoral component, a #2 keeled tibial tray  with a 10 mm bridging bearing and a three-pegged metal backed rotating  patella.  All were secured with polymethyl methacrylate.   DESCRIPTION OF PROCEDURE:  With the patient comfortable on the operating  table and under general orotracheal anesthesia, the nursing staff inserted a  Foley catheter.  The patient had a preoperative femoral nerve block in the  holding area by anesthesia.  Tourniquet was applied to the right lower  extremity.  The leg was then prepped with Betadine scrub and DuraPrep from  the tourniquet to the midfoot.  Sterile draping was performed.  With the  extremity still elevated, it was Esmarch exsanguinated with the proximal  tourniquet at 350 mmHg.   A midline longitudinal incision was made centered about the patella and by  sharp dissection carried down to subcutaneous tissue.  The patient had a  very large leg which made the procedure technically difficult.  There was  abundant adipose tissue that was incised with a Bovie.  The first layer of  capsule was incised in the midline.  A medial parapatellar incision was then  made a straight longitudinal line with the Bovie.  The  joint was entered.  This was approximately 20 mL of clear yellow joint effusion.  The patella  was then everted 180 degrees laterally, the knee flexed to the 90 degrees.  There was absence of articular cartilage in the medial and to a greater  extent the lateral compartment.  There were large osteophytes along the  medial and lateral femoral condyle and particularly the patella, which was  also devoid of articular cartilage.  The lateral meniscus was torn.   Osteophytes were removed.  We then templated the femoral component and  measured a medium.  Retractors were then inserted medially and laterally.   Initial bony cut was made on the proximal tibia with the 7 degree posterior  inclination using the external guide.  Subsequent cuts were then made on the  femur with 10 mm flexion and extension gaps symmetrical.  MCL and LCL  remained intact. The patient was slightly loose medially and tight laterally  because of the chronic valgus position.  Popliteus was released.  Just  minimal release of the lateral capsule was performed along the tibia.   There were multiple large loose  bodies behind the medial and lateral femoral  condyle, particularly medially.  Several measured over 1 cm in diameter.  A  4 degree distal femoral valgus cut was utilized.  Final femoral guide was  then used to obtain the oblique cuts and to osteotomize the center section  of the femur.   Lamina spreaders were then inserted along the medial lateral compartments.  Remnants of medial and lateral menisci were removed as well as any remnants  of ACL and PCL which were completely sacrificed.  Osteophytes were removed  from the medial and lateral femoral condyles.   Retractor was then placed posteriorly to advance the tibial anteriorly.  We  measured a #2 tibial tray.  Cuts were then made to accept the tibial tray.  The keeled cut was then made.  A 10 mm bridging bearing was inserted  followed by the medium femoral trial  component.  The entire construct was  reduced, we had excellent range of motion, full flexion, slight  hyperextension, no opening to varus or valgus stress.   Patella was then prepared by removing approximately 5 mm of bone, replacing  10 mm but the patella at that point was only 12 mm thick.  The tripegged  patella guide was then applied.  The three holes made.  The trial patella  was then applied.  Through a full range of motion there was mild  subluxation, so a lateral release was performed with a Bovie.  At that point  the patella was stable.   The trial components were removed.  The joint was then copiously irrigated  with saline solution.  Final components were inserted with polymethyl  methacrylate.  Extraneous methacrylate was removed from about the tibia.  It  was compacted nicely on the tibia.  The 10 mm bridging bearing was inserted  followed by the cemented medium femoral component.  Again extraneous  methacrylate was removed.  The knee was placed in extension.  He had nice  compression of the components on bone.  The patella was applied with  methacrylate and a patella clamp.  Extraneous methacrylate was removed from  about its periphery.   After complete maturation, the joint was explored.  There was no evidence of  any loose material.  Any extraneous methacrylate was removed with an  osteotome.  The wound was again irrigated with jet saline solution.  Tourniquet was deflated.  There was immediate capillary refill to the joint  surface.  Synovectomy was performed.   Hemovac was inserted.  We had a nice dry field.  The deep capsule was closed  with interrupted #1 Ethibond, superficial capsule closed with interrupted 0  Vicryl and subcu with 2-0 Vicryl.  Skin closed with skin clips.  Sterile  bulky dressing was applied followed by the patient's support stocking.  The  patient tolerated the procedure well without complications.      Claude Manges. Cleophas Dunker,  M.D. Electronically Signed     PWW/MEDQ  D:  11/07/2004  T:  11/07/2004  Job:  [id number]

## 2010-06-14 NOTE — H&P (Signed)
NAME:  Lydia King, Lydia King                        ACCOUNT NO.:  [id number]   MEDICAL RECORD NO.:  [id number]                   PATIENT TYPE:  INP   LOCATION:  NA                                   FACILITY:  [facility]   PHYSICIAN:  Legrand Pitts. Duffy, P.A.                DATE OF BIRTH:  February 21, 1930   DATE OF ADMISSION:  09/14/2001  DATE OF DISCHARGE:                                HISTORY & PHYSICAL   CHIEF COMPLAINT:  Bilateral knee pain for the last 15 years with the left  greater than the right.   HISTORY OF PRESENT ILLNESS:  This 77 year old white female patient presents  to Dr. Cleophas Dunker with a history of about 15 years of intermittent bilateral  knee pain.  She reports she fell about 15 years ago and really injured her  left knee.  For the last several years both knees have been giving her  problems but the left knee is much worse than right and it has seemed to get  gradually and progressively worse.  At this point the left knee pain is  described as a constant, sharp, stabbing and burning sensation that is just  like a toothache located diffusely about the left knee.  It occasionally  radiates proximally up to the mid thigh and distally down to the ankle.  The  pain increases with any ambulation or if she does housework or rides in a  car.  The pain does diminish some if she rests or if she takes Arthrotec.  She is currently taking Arthrotec 75 mg twice a day and occasionally 3 times  a day.  The knee does pop, grind and catch.  It also swells intermittently  and does keep her up at night.  She has tried multiple antiinflammatories in  the past and Arthrotec seems to be the most effective.  She has had Synvisc  and cortisone injections in both knees and it did seem to help the right  knee but the Synvisc did not seem to help the left knee at all and the last  cortisone shot she received in the left knee did not relieve her pain at  all.  She is currently ambulating with the use of a  cane.   ALLERGIES:  1. Penicillin causes a rash and edema of her throat.  2. Sulfa causes pruritus.  3. Codeine causes dizziness, nausea and vomiting.   CURRENT MEDICATIONS:  1. Prilosec 20 mg 1 tablet p.o. q day.  2. Ogen 0.625 mg p.o. q day.  3. Prozac 20 mg p.o. q day.  4. Zestril 10 mg p.o. q day.  5. Arthrotec 75 mg p.o. b.i.d. or t.i.d. p.r.n.  6. Zocor 40 mg p.o. q day.  7. Allegra D 1 tablet p.o. q 12 hours p.r.n. allergies.  8. Butalbital 1 tablet p.o. p.r.n. migraine.  9. Triamterene/hydrochlorothiazide 25 mg 1 tablet p.o. q Monday, Wednesday  and Friday.  10.      Ferrous sulfate 1 tablet p.o. t.i.d.   PAST MEDICAL HISTORY:  1. Hypertension for the last 75 years.  2. Hiatal hernia and gastroesophageal reflux disease for the last 30 years.  3. Mitral valve prolapse.  4. History of angina with her last episode approximately 4-5 years ago.  She     did have a Cardiolite stress test about 6 weeks ago and reported that was     normal.  5. Migraines since the age of 28, she has not had one for over a month.  6. 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  7. Gastritis.  8. She denies any history of diabetes mellitus, thyroid disease, peptic     ulcer disease, asthma or any other chronic medical condition other than     noted previously.   PAST SURGICAL HISTORY:  1. Total abdominal hysterectomy approximately 35 years ago.  2. Open cholecystectomy about 33 years ago.  3. Hiatal hernia repair approximately 1995.  4. Fusion of her midfoot.   SOCIAL HISTORY:  She denies any history of cigarette smoking, alcohol use or  drug use.  She is a widow and has 4 children.  She currently lives by  herself in a 1 story house and has no steps into the main entrance.  She is  a retired Ship broker.  Her medical doctor is Dr. De Hollingshead in  [place], [place].  His phone number is [phone].   FAMILY HISTORY:  Her mother died at the age of 63 with  hypertension and a  stroke.  Her father died at the age of 40 with hypertension and cerebral  hemorrhage.  She has 1 brother alive at age 50 with coronary artery disease.  She has 4 sisters ranging in age from 61 to 52 and they all have Charcot  Marie Tooth disease.  She had 4 brothers who passed away, one at age 6 with  COPD and hepatitis, one at age 65 with stomach cancer and 2 as infants.  She  did have 1 sister who passed away at age 37 due to typhoid fever.  Her  children range in age from 6 to 43 and they are all healthy and alive.   REVIEW OF SYSTEMS:  She does have left ear tinnitus and has had that for the  last 12 years with some mild hearing loss in that ear.  She has some dyspnea  on exertion on any uneven ground, probably due to deconditioning.  She does  have some seasonal allergies and sinus problems which she treats with  Allegra D.  She has problems with arthritis in her elbows, shoulders and  wrists and she attributes that to the Charcot Marie tooth disease.  She does  have dentures on both the upper and lower jaw line and does wear glasses.  She has some problems with constipation.  She does not have a living will  nor a power of attorney.  All other systems are negative and  noncontributory.   PHYSICAL EXAMINATION:  GENERAL:  A well-developed and well-nourished,  overweight white female who moves slowly from a sitting to a standing  position and walks with a fairly significant limp on the left.  Mood and  affect are appropriate, talks easily with examiner.  VITAL SIGNS:  Height is 5 feet 0 inches, weight 195 pounds, BMI is 37.  Temperature 97.7 degrees Fahrenheit, pulse 72, respirations and blood  pressure  152/78.  HEENT:  Normocephalic, atraumatic without frontal or maxillary sinus  tenderness to palpation.  Conjunctivae pink, sclerae anicteric, PERRLA.  EOM's are intact.  Funduscopic exam shows visible red reflex bilaterally with normal retinal vasculature, optic  disk and cup.  No visible external  ear deformities.  Hearing is grossly intact.  Tympanic membranes bilaterally  appear pearly gray with good light reflex noted on the left with light  reflex occluded by cerumen on the right.  Hearing is grossly intact.  Nose  and nasal septum midline.  Nasal mucosa pink and moist without exudates or  polyps noted.  Buccal mucosa pink and moist.  Dentures are in place.  Pharynx is without erythema or exudate.  Tongue and uvula midline.  Tongue  without vesiculations and uvula rises easily with phonation.  NECK:  No visible masses or lesions noted.  Trachea is midline.  No palpable  lymphadenopathy nor thyromegaly.  Carotids +2 bilaterally without bruits.  Full range of motion and nontender to palpation along the cervical spine.  CARDIOVASCULAR:  Heart rate and rhythm regular, S1 and S2 present without  rubs, clicks or murmurs noted.  RESPIRATORY:  Respirations are even and unlabored.  Breath sounds clear to  auscultation bilaterally without rales or wheezes noted.  ABDOMEN:  Rounded abdominal contour, bowel sounds are present x 4 quadrants.  Soft and nontender to palpation without hepatosplenomegaly, no CVA  tenderness.  Femoral pulses are +2 bilaterally.  Nontender to palpation  along the vertebral column.  BREASTS/GU/RECTAL/PELVIC:  These exams are deferred at this time.  MUSCULOSKELETAL:  No obvious deformities.  Bilateral upper extremities are  with full range of motion, these extremities are without pain.  Radial  pulses are 2+ bilaterally.  She has full range of motion of her hips, ankles  and toes bilaterally.  DP and PT pulses are +2.  No lower extremity edema  noted at this time. She does have some very well-healed scars noted about  the right ankle.  Right knee has about a 10 degree valgus deformity when she  is laying down.  Skin is otherwise intact without erythema or ecchymosis.  She has full extension and flexion to about 110 degrees with a  moderate to  large amount of crepitus with range of motion.  She does have pain with  palpation on both the medial and lateral joint line and probably a +1  effusion.  Unable to test collateral stability at this time due to guarding.  Left knee also has a visible valgus deformity when she lies and appears to  be 15-20 degrees.  Skin is otherwise intact about the knee without erythema  or ecchymosis.  She has full extension of the left knee but flexion only to  90 degrees and she has to move slowly initially with any active range of  motion.  There is a moderate amount of crepitus with range of motion of the  left knee and she is acutely tender to palpation on both the medial and  lateral joint line.  She does have a +1 effusion in the left knee and  resists any varus or valgus stress to check collateral stability.  NEUROLOGICAL:  Alert and oriented x 3, cranial nerves II-XII are grossly intact.  Strength is 5/5 in bilateral upper and lower extremities, rapid  alternating movements intact.  Deep tendon reflexes are 2+ bilateral in  upper and lower extremities and sensation intact to light touch.   RADIOLOGIC FINDINGS:  X-rays taken of both  knees in June of 2003 showed  significant end stage osteoarthritis in both knees.   IMPRESSION:  1. End stage osteoarthritis in bilateral knees, left worse than right.  2. Hypertension.  3. Hiatal hernia.  4. Gastroesophageal reflux disease.  5. Mitral valve prolapse.  6. Migraines.  7. Charcot-Marie-Tooth disease.  8. Gastritis.  9. History of angina.  10.      Seasonal allergies.   PLAN:  Mrs. Weisse will be admitted to [facility] on September 14, 2001 where she will undergo a left total knee arthroplasty by Dr. Claude Manges.  Whitfield.  She will undergo all the routine preoperative laboratory tests  and studies prior to this procedure.  If she has any medical issues we will  consult one of the Hospitalists.                                                 Legrand Pitts Duffy, P.A.    KED/MEDQ  D:  09/07/2001  T:  09/10/2001  Job:  [id number]

## 2010-06-14 NOTE — Discharge Summary (Signed)
NAMEMELYNA, Lydia King                          ACCOUNT NO.:  [id number]   MEDICAL RECORD NO.:  [id number]                   PATIENT TYPE:   LOCATION:                                       FACILITY:   PHYSICIAN:  Jamelle Rushing, P.A.                DATE OF BIRTH:  08-13-1930   DATE OF ADMISSION:  09/14/2001  DATE OF DISCHARGE:                                 DISCHARGE SUMMARY   ADMISSION DIAGNOSES:  1. End stage osteoarthritis left knee.  2. Hypertension.  3. History of hiatal hernia.  4. Gastroesophageal reflux disease.  5. Mitral valve prolapse.  6. Migraines.  7. Charcot-Marie-Tooth disease.  8. Gastritis.  9. History of angina.  10.      Multiple seasonal allergies.   DISCHARGE DIAGNOSES:  1. Left total knee arthroplasty.  2. Deep vein thrombosis.  3. Postoperative blood loss anemia.  4. Hypertension.  5. History of hiatal hernia.  6. History of gastroesophageal reflux disease.  7. History of mitral valve prolapse.  8. History of migraines.  9. History of Charcot-Marie-Tooth disease.  10.      History of gastritis.  11.      History of angina.   PAST SURGICAL HISTORY:  September 14, 2001 the patient was taken to the OR by  Norlene Campbell, M.D. assisted by Arlyn Leak, P.A.-C. Under general  anesthesia, the patient underwent a left total knee arthroplasty with a  Depuy LCS complete medial femoral component and a #2 rotating tibial  platform with a 12.5 bridging bearing, a metal back to three pegged designed  rotating patella. All were secured with polymethylmethacrylate. The patient  tolerated the procedure well. There were no complications. No drains were  left in place and the patient was transferred to the recovery room  in good  condition after receiving a postoperative femoral nerve block.   CONSULTATIONS:  A senior care hospital consult was requested for evaluation  and management of the patient's multiple medical issues during her  hospitalization.   HOSPITAL COURSE:  On September 14, 2001 the patient was admitted to [facility] under the care of Norlene Campbell, M.D. The patient was taken to  the OR where the patient had a left total knee arthroplasty performed. The  patient tolerated the procedure well. She received a postoperative femoral  nerve block for pain control and placed on subcutaneous Heparin until  routine Coumadin was therapeutic for deep vein thrombosis prophylaxis. The  patient then incurred a total of three days postoperative care on the  orthopedic floor in which the patient did develop some calf pain and  swelling. She was evaluated with a Doppler which showed signs consistent  with a deep vein thrombosis. She was already on subcutaneous Heparin until  the Coumadin was therapeutic so she was transitioned over to IV Heparin  protocol until the Coumadin was therapeutic. Otherwise, the patient worked  well  with PT. She did develop some postoperative blood loss anemia in which  she was, for the most part, asymptomatic and did not require a blood  transfusion. Vital signs remained stable. Her wound remained benign for any  signs of infection and it was felt that on postoperative day three, she was  stable and ready for discharge to rehabilitative. A rehabilitative consult  was evaluated and she was accepted on the subacute care unit at the current  time.   DIAGNOSTIC STUDIES:  A venous Doppler on September 16, 2001 shows right sided,  no evidence of deep vein thrombosis superficial thrombus or Baker's cyst.  Left side deep vein thrombosis visualized in the poplitea vein. EKG on  admission revealed normal sinus rhythm at 71 beats per minute.   LABORATORY DATA:  CBC on August 21 revealed WBC of 6.2, hemoglobin and  hematocrit 10.4 and 31.3, platelets 136, INR 1.7. Routine chemistries  revealed sodium of 135, potassium 5.3, glucose 147, BUN 7, creatinine 0.6.  Routine UA on admission found 15 ketones, otherwise  normal.   DISCHARGE MEDICATIONS:  1. Colace 100 mg po twice a day.  2. Laxative or enema of choice as needed.  3. Percocet one or two tabs every four to six hours as needed pain.  4. Phenergan 12.5 to 25 mg po every six hours as needed.  5. Tylenol 650 mg po every four hours as needed.  6. Robaxin 500 mg po every six hours as needed.  7. Restoril 30 mg po QHS as needed.  8. Coumadin per pharmacy dosing.  9. Prilosec 20 mg po QD.  10.      Zestril 10 mg po QD.  11.      Ogen 0.625 mg po QD.  12.      Prozac 20 mg po QD.  13.      Triamterene/HCTZ 25 mg po QD.  14.      Zocor 40 mg po QD.  15.      Heparin IV drip for deep vein thrombosis prophylaxis protocol.   DISCHARGE INSTRUCTIONS:  Subacute care floor.   MEDICATIONS:  The patient continued medications as dispensed on the ortho  floor. Continue IV Heparin until Coumadin becomes therapeutic at 2.0 or 2.5  INR.   ACTIVITY:  The patient may be weight bearing as tolerated with the use of a  walker.   DIET:  No restrictions.   WOUND CARE:  The patient should have wound checked daily for any signs of  infection. Staples were removed on postoperative day fourteen.    FOLLOW UP:  The patient should have follow-up appointment with Dr. Cleophas Dunker  one week from the date of discharge from the Subacute Care Unit.                                               Jamelle Rushing, P.A.    RWK/MEDQ  D:  10/27/2001  T:  10/31/2001  Job:  [id number]   cc:   Claude Manges. Cleophas Dunker, M.D.

## 2010-06-14 NOTE — H&P (Signed)
Lydia King, Lydia King              ACCOUNT NO.:  [id number]   MEDICAL RECORD NO.:  [id number]          PATIENT TYPE:  INP   LOCATION:  NA                           FACILITY:  [facility]   PHYSICIAN:  Claude Manges. Whitfield, M.D.DATE OF BIRTH:  03-Apr-1930   DATE OF ADMISSION:  11/07/2004  DATE OF DISCHARGE:                                HISTORY & PHYSICAL   CHIEF COMPLAINT:  End-stage osteoarthritis right knee.   HISTORY OF PRESENT ILLNESS:  Ms. Paterson is a pleasant, 77 year old white  female with right knee pain for the past 35 years. The patient notes that 35  years ago she fell through the flooring of an old house injuring her right  knee. The pain in the knee had waxed and waned over the years. However over  the past 10 years it became progressively worse. She notes giving away and a  catching sensation in the knee. The pain is described as a burning, dull,  throbbing pain that is constant. The patient does have waking pain. She uses  a cane or a walker to ambulate. She has failed conservative treatment which  included Synvisc injections. She takes Arthrotec which helps with the knee  pain but does not totally alleviate her knee pain.   ALLERGIES:  PENICILLIN (causes edema of throat), SULFA (caused pruritus).  CODEINE (pruritus).  TRAMADOL (pruritus), Morphine (rash and pruritus). SHRIMP.   MEDICATIONS:  1.  Nexium 40 mg one q.a.m.  2.  Arthrotec 75/0.2 mg one daily after breakfast.  3.  Fluoxetine HCL 20 mg one daily after breakfast.  4.  Triamterene/HCTZ 75/50 mg p.r.n. lower leg edema.  5.  Allegra 180 mg p.r.n.   PAST MEDICAL HISTORY:  1.  Left total knee arthroplasty by Dr. Cleophas Dunker September 14, 2001.  2.  Sleep apnea.  3.  Mitral valve prolapse.  4.  Irritable bowel syndrome.  5.  Charcot-Marie Tooth disease.  6.  History of hiatal hernia.  7.  Gastroesophageal reflux disease.  8.  Migraines.  9.  Deep venous thrombosis postoperative left total knee arthroplasty in   2003.  10. Diverticulosis.  11. Seasonal allergies.   PAST SURGICAL HISTORY:  Hysterectomy, cholecystectomy, hiatal hernia repair,  fusion mid foot, left total knee arthroplasty with postoperative DVT in  2003.   SOCIAL HISTORY:  The patient denies any tobacco or alcohol use. She is  widowed, has four adult children.  She lives in a [place] home with no steps at the usual entrance. The  patient is retired.  The patient's primary care physician is [facility] [employer] [facility] in [place], [place].   FAMILY HISTORY:  Mother deceased at age 8 with hypertension and a stroke.  Father deceased of a CVA at age 38, had a history of hypertension. She has  one living brother age 64 with hypertension and coronary artery disease. She  had one brother who passed away at age 34 with COPD and hepatitis. One at  age 32 with stomach cancer. Two as infants. She has four living sisters who  all have Charcot-Marie Tooth disease, otherwise the patient's family history  is  positive for diabetes mellitus.   REVIEW OF SYSTEMS:  The patient with a recent bout of pneumonia, asthma  attack July 6th which has resolved.  She does have shortness of breath with exertion at approximately 2 blocks.  No paroxysmal nocturnal dyspnea, no orthopnea. She wears upper and lower  dentures. She wears glasses at all times. She suffers from gastric reflux,  diarrhea and constipation. Has irritable bowel syndrome and diverticulosis.  No GI symptoms. Otherwise review of systems is negative or noncontributory.  The patient does not have a Living Will or power of attorney.   PHYSICAL EXAMINATION:  GENERAL:  The patient is a well-developed, well-  nourished female who walks with an antalgic gait on the right and uses a  cane to ambulate. The patient otherwise is alert and oriented, in no acute  distress. The patient talks easily with the examiner. Height is 5 feet, 5  inches, weight is 199 pounds.  VITAL SIGNS:  Temperature is  97.5 degrees Fahrenheit. Blood pressure 160/70,  pulse 78, respiratory rate 18.  CARDIAC:  Regular rate and rhythm, no murmurs, rubs, or gallops noted.  RESPIRATORY:  Lungs are clear to auscultation, no wheezing, rhonchi, rales  noted.  ABDOMEN:  Obese, soft, nontender. Bowel sounds x4 quadrants. She has a well-  healed  midline horizontal scar in the epigastric region.  NEUROLOGIC:  The patient is alert and oriented x3. Cranial nerves II-XII  grossly intact.  Deep tendon reflexes of ankles and knees bilaterally not elicited. Strength  testing of lower extremities reveals bilateral 5/5 strength throughout.  NECK:  No lymphadenopathy. Carotids are 2+ bilaterally and without bruits.  The patient has full range of motion of the cervical spine without pain.  Palpation of the cervical spine reveals no tenderness. Trachea is midline.  BREASTS/GU/RECTAL EXAMS:  Deferred at this time.  HEENT:  Head is normocephalic. The patient does have minimal tenderness with  palpation of the frontal and maxillary sinuses. Conjunctivae pink and moist.  PERRLA. Extraocular movements are intact. External ears without any  deformity. Tympanic membranes are pearly and gray bilaterally.  Nasal septum  is midline. Nasal mucosa is pink and moist without polyps. Buccal mucosa is  pink and moist. Pharynx without exudate. Tongue and uvula midline.  UPPER EXTREMITIES:  Are equal and symmetric in size and shape. The patient  has full range of motion of the shoulders, elbows, wrists, hands. Radial  pulses are 2+ bilaterally.  Lower extremities have full range of motion of  both hips without pain.  RIGHT KNEE:  Reveals 0-75 degrees of flexion. Forced flexion beyond this  point is painful. The patient has a valgus deformity of 10 degrees. Valgus  varus stress and reveals no laxity. No effusion. No edema is noted. Anterior  drawer is negative. LEFT KNEE:  Well-healed surgical incision. The patient actually has 2 to 3   degrees hyperextension and flexes easily to 100 degrees. Valgus varus stress  and reveals no laxity. No edema or effusion is noted about the knee.  LOWER EXTREMITIES:  The patient has trace edema throughout the lower  extremities. Dorsalis and pedal pulses are 2+ bilaterally. Otherwise the  patient has good sensation to light touch throughout the feet.  THORACIC/LUMBAR:  The patient has mild tenderness to palpation of the upper  thoracic spinal column with palpation. Lumbar is without tenderness to  palpation.  X-RAYS:  X-rays of the right knee reveal end-stage tricompartmental  osteoarthritis with large osteophytes on the medial and lateral compartments  as well as patellar and femoral joints.   IMPRESSION:  1.  End-state osteoarthritis right knee.  2.  History of left total knee arthroplasty in 2003 with deep venous      thrombosis pos top.  3.  Sleep apnea.  4.  Mitral valve prolapse.  5.  Charcot-Marie Tooth disease.  6.  History of hiatal hernia.  7.  Gastroesophageal reflux disease.  8.  Migraines.  9.  Seasonal allergies.  10. Multiple drug allergies.   PLAN:  The patient is to be admitted to [facility] on November 07, 2004 to undergo a right total knee arthroplasty. The patient is to undergo  all lab testing prior to surgery. The patient did receive preoperative  clearance by Dr. Mallie Snooks in [place].   The patient will receive heparin 3000 unit subcutaneously, 1 hour  preoperatively.  Postoperatively the patient is to be placed on a Dilaudid PCA.      Richardean Canal, P.A.      Claude Manges. Cleophas Dunker, M.D.  Electronically Signed    GC/MEDQ  D:  11/05/2004  T:  11/05/2004  Job:  [id number]

## 2010-06-14 NOTE — Discharge Summary (Signed)
Lydia King, Lydia King              ACCOUNT NO.:  [id number]   MEDICAL RECORD NO.:  [id number]          PATIENT TYPE:  ORB   LOCATION:  4525                         FACILITY:  [facility]   PHYSICIAN:  Erick Colace, M.D.DATE OF BIRTH:  07-03-1930   DATE OF ADMISSION:  11/12/2004  DATE OF DISCHARGE:  11/20/2004                                 DISCHARGE SUMMARY   DISCHARGE DIAGNOSES:  1.  Right total knee arthroplasty secondary to degenerative joint disease,      November 07, 2004.  2.  Pain management.  3.  Coumadin for deep vein thrombosis prophylaxis.  4.  Postoperative anemia.  5.  Hypertension.  6.  Depression.  7.  Enterococcus urinary tract infection.  8.  History of a left total knee replacement 2003.  9.  Sleep apnea.   HISTORY OF PRESENT ILLNESS:  This is a 77 year old female with history of a  left total knee arthroplasty in 2003, received subacute care services August  22-September 29, 2001.  Now admitted November 07, 2004, with end-stage changes  of the right knee and no relief with conservative care.  She underwent a  right total knee arthroplasty October 12 per Dr. Cleophas Dunker.  Placed on  Coumadin for deep vein thrombosis prophylaxis, 50% partial weightbearing.  Pain controlled with PCA, discontinued October 14.  Venous Doppler studies  of the lower extremities October 14 negative.  Postoperative anemia 9.6 and  monitored.  The patient was admitted to subacute care services.   PAST MEDICAL HISTORY:  See discharge diagnoses.   No alcohol or tobacco.   ALLERGIES:  PENICILLIN, SULFA, CODEINE, and TRAMADOL.   SOCIAL HISTORY:  Lives alone in [place].  One-level home, no steps to  entry.  Local family works.   MEDICATIONS PRIOR TO ADMISSION:  1.  Nexium 40 mg daily.  2.  Maxzide 75/50 mg daily.  3.  Prozac 20 mg daily.  4.  Claritin daily.  5.  Arthrotec 75 mg daily.   HOSPITAL COURSE:  Patient with progressive gains while on rehab services  with therapies  initiated daily.  The following issues were followed during  the patient's rehab course.  Pertaining to Ms. Wynes's right total knee  arthroplasty, surgical site healing nicely.  No signs of infection.  She was  ambulating household distances with a walker, partial weightbearing with  knee precautions.  She was using Vicodin and Robaxin as needed for pain with  good results.  She remained on Coumadin for deep vein thrombosis prophylaxis  with latest INR of 2.1 with protocol to be completed by [facility].  Postoperative anemia stable with latest hemoglobin of 10.2,  hematocrit 29.7.  Her iron supplement had been discontinued due to mild  nausea.  Blood pressures were monitored with Maxzide.  She remained on her  Prozac for history of depression.  During her rehab course she was treated  with Cipro for seven days for Enterococcus urinary tract infection.  She  denied any dysuria or hematuria.  She remained on her CPAP machine for sleep  apnea as prior to hospital admission with current  settings as prior to  admission.   Functionally she was ambulating household distances with a walker, needing  minimal assist for lower body dressing.  She was simple set-up for upper  body.  Home health therapies had been arranged.  She was discharged home in  stable condition.   Latest labs showed an INR 2.1, hemoglobin 10.2, hematocrit 29.7, platelets  220,000.  Sodium 135, potassium 3.7, BUN 7, creatinine 0.6.   Discharge medications at the time of dictation included:  1.  Coumadin latest dose of 3 mg, to be completed on December 08, 2004.  2.  Protonix 80 mg daily.  3.  Claritin 10 mg daily.  4.  Prozac 20 mg daily.  5.  OxyContin Sustained Release 10 mg every 12 hours x1 week.  6.  Maxzide 75/50 mg one tablet every other day.  7.  Robaxin 500 mg every six hours as needed.  8.  Oxycodone as needed for breakthrough pain.   ACTIVITY:  Partial weightbearing.   DIET:   Regular.   WOUND CARE:  Cleanse incision daily, warm soap and water.   FOLLOW-UP:  Dr. Cleophas Dunker, orthopedic services, as advised.  Home health  nurse per Genevieve Norlander to complete Coumadin protocol.      Mariam Dollar, P.A.      Erick Colace, M.D.  Electronically Signed    DA/MEDQ  D:  11/19/2004  T:  11/19/2004  Job:  [id number]   cc:   Dr. Janae Sauce, [place]   Claude Manges. Cleophas Dunker, M.D.  Fax: [phone]

## 2010-07-01 ENCOUNTER — Other Ambulatory Visit: Payer: Self-pay | Admitting: *Deleted

## 2010-07-01 MED ORDER — OMEGA-3-ACID ETHYL ESTERS 1 G PO CAPS: 1.0000 g | ORAL_CAPSULE | Freq: Every day | ORAL | Status: DC

## 2010-08-09 ENCOUNTER — Encounter: Payer: Self-pay | Admitting: Internal Medicine

## 2010-08-21 ENCOUNTER — Other Ambulatory Visit: Payer: Self-pay | Admitting: *Deleted

## 2010-08-21 ENCOUNTER — Other Ambulatory Visit: Payer: Self-pay | Admitting: Family Medicine

## 2010-08-21 MED ORDER — LOSARTAN POTASSIUM 100 MG PO TABS: 50.0000 mg | ORAL_TABLET | Freq: Every day | ORAL | Status: DC

## 2010-08-22 ENCOUNTER — Other Ambulatory Visit: Payer: Self-pay | Admitting: Family Medicine

## 2010-10-17 ENCOUNTER — Telehealth: Payer: Self-pay | Admitting: *Deleted

## 2010-10-17 NOTE — Telephone Encounter (Signed)
Pt has dropped off a form for handicapped placard, this is on your shelf.

## 2010-10-18 NOTE — Telephone Encounter (Signed)
Done and in IN box 

## 2010-10-21 NOTE — Telephone Encounter (Signed)
Patient's daughter notified as instructed by telephone. Completed form at front desk for pick up.

## 2010-10-30 ENCOUNTER — Other Ambulatory Visit: Payer: Self-pay | Admitting: *Deleted

## 2010-10-30 MED ORDER — CLOPIDOGREL BISULFATE 75 MG PO TABS: 75.0000 mg | ORAL_TABLET | Freq: Every day | ORAL | Status: DC

## 2010-10-30 NOTE — Telephone Encounter (Signed)
Patient has not been seen in some time.  ? RF from [place] or Cardiology?

## 2010-10-30 NOTE — Telephone Encounter (Signed)
She is due for a general follow up with me -thanks- please schedule  I do her plavix px -- will send it electronically

## 2010-10-30 NOTE — Telephone Encounter (Signed)
Pt already scheduled for f/u with Dr Milinda Antis 11/22/10.

## 2010-11-07 ENCOUNTER — Other Ambulatory Visit: Payer: Self-pay | Admitting: *Deleted

## 2010-11-07 ENCOUNTER — Other Ambulatory Visit: Payer: Self-pay | Admitting: Family Medicine

## 2010-11-07 MED ORDER — ESOMEPRAZOLE MAGNESIUM 40 MG PO CPDR: 40.0000 mg | DELAYED_RELEASE_CAPSULE | Freq: Every day | ORAL | Status: DC

## 2010-11-09 ENCOUNTER — Telehealth: Payer: Self-pay | Admitting: Family Medicine

## 2010-11-09 DIAGNOSIS — M899 Disorder of bone, unspecified: Secondary | ICD-10-CM

## 2010-11-09 DIAGNOSIS — E559 Vitamin D deficiency, unspecified: Secondary | ICD-10-CM

## 2010-11-09 DIAGNOSIS — I10 Essential (primary) hypertension: Secondary | ICD-10-CM

## 2010-11-09 DIAGNOSIS — E785 Hyperlipidemia, unspecified: Secondary | ICD-10-CM

## 2010-11-09 NOTE — Telephone Encounter (Signed)
Message copied by Judy Pimple on Sat Nov 09, 2010 10:43 AM ------      Message from: Alvina Chou      Created: Thu Nov 07, 2010  9:22 AM      Regarding: f/u labs for Tues 10-16       F/u labs

## 2010-11-12 ENCOUNTER — Other Ambulatory Visit (INDEPENDENT_AMBULATORY_CARE_PROVIDER_SITE_OTHER): Payer: Medicare Other

## 2010-11-12 DIAGNOSIS — E785 Hyperlipidemia, unspecified: Secondary | ICD-10-CM

## 2010-11-12 DIAGNOSIS — I10 Essential (primary) hypertension: Secondary | ICD-10-CM

## 2010-11-12 DIAGNOSIS — E559 Vitamin D deficiency, unspecified: Secondary | ICD-10-CM

## 2010-11-12 DIAGNOSIS — M949 Disorder of cartilage, unspecified: Secondary | ICD-10-CM

## 2010-11-12 LAB — COMPREHENSIVE METABOLIC PANEL
ALT: 16 U/L (ref 0–35)
AST: 18 U/L (ref 0–37)
Alkaline Phosphatase: 58 U/L (ref 39–117)
Chloride: 107 mEq/L (ref 96–112)
Creatinine, Ser: 1 mg/dL (ref 0.4–1.2)
GFR: 57.62 mL/min — ABNORMAL LOW (ref >60.00)
Total Bilirubin: 0.6 mg/dL (ref 0.3–1.2)
Total Protein: 6.5 g/dL (ref 6.0–8.3)

## 2010-11-12 LAB — CBC WITH DIFFERENTIAL/PLATELET
Basophils Absolute: 0 10*3/uL (ref 0.0–0.1)
Eosinophils Absolute: 0.1 10*3/uL (ref 0.0–0.7)
HCT: 40.7 % (ref 36.0–46.0)
Hemoglobin: 13.6 g/dL (ref 12.0–15.0)
Lymphs Abs: 1.5 10*3/uL (ref 0.7–4.0)
MCHC: 33.5 g/dL (ref 30.0–36.0)
MCV: 93.4 fl (ref 78.0–100.0)
Monocytes Absolute: 0.2 10*3/uL (ref 0.1–1.0)
Neutro Abs: 2.5 10*3/uL (ref 1.4–7.7)
Platelets: 152 10*3/uL (ref 150.0–400.0)
RDW: 13.2 % (ref 11.5–14.6)

## 2010-11-12 LAB — TSH: TSH: 1.53 u[IU]/mL (ref 0.35–5.50)

## 2010-11-12 LAB — LIPID PANEL
HDL: 67.1 mg/dL (ref >39.00)
LDL Cholesterol: 98 mg/dL (ref 0–99)
Total CHOL/HDL Ratio: 3
VLDL: 14.4 mg/dL (ref 0.0–40.0)

## 2010-11-21 ENCOUNTER — Encounter: Payer: Self-pay | Admitting: Family Medicine

## 2010-11-22 ENCOUNTER — Encounter: Payer: Self-pay | Admitting: Family Medicine

## 2010-11-22 ENCOUNTER — Ambulatory Visit (INDEPENDENT_AMBULATORY_CARE_PROVIDER_SITE_OTHER): Payer: Medicare Other | Admitting: Family Medicine

## 2010-11-22 DIAGNOSIS — I10 Essential (primary) hypertension: Secondary | ICD-10-CM

## 2010-11-22 DIAGNOSIS — Z23 Encounter for immunization: Secondary | ICD-10-CM

## 2010-11-22 DIAGNOSIS — R51 Headache: Secondary | ICD-10-CM

## 2010-11-22 DIAGNOSIS — E559 Vitamin D deficiency, unspecified: Secondary | ICD-10-CM

## 2010-11-22 DIAGNOSIS — E785 Hyperlipidemia, unspecified: Secondary | ICD-10-CM

## 2010-11-22 MED ORDER — LOSARTAN POTASSIUM 100 MG PO TABS: 50.0000 mg | ORAL_TABLET | Freq: Every day | ORAL | Status: DC

## 2010-11-22 MED ORDER — ROSUVASTATIN CALCIUM 10 MG PO TABS: 10.0000 mg | ORAL_TABLET | Freq: Every day | ORAL | Status: DC

## 2010-11-22 NOTE — Progress Notes (Signed)
Subjective:    Patient ID: Lydia King, female    DOB: 05/10/1930, 77 y.o.   MRN: [id number]  HPI Here for f/u for HTN and hyperlipidemia and vit D def (with osteopenia ) and chronic headaches  Is doing very well - happy for that   Vision is actually improving a bit from her stroke - can see colors a bit now  Headaches are overall better - only takes a tylenol once in while -not bad ones or posterior pain  Very pleased with this  Her quality of life is better   HTN is well controlled 120/66 No cp or edema or palpitations Has been very well controlled at home   Wt is up 5 lb with bmi of 42 Has weighed at home - did gain  Thinks she may try some chair exercise -- a program is coming out that she is going to sign up for   Vit D level is 58- very good  Is taking over the counter every day 4000 iu daily  No side effects   Glucose is 100 Diet - is overall much better  Less sugar/ less fat/smaller portions overall   Lipids are very well controlled with crestor and diet   Lab Results  Component Value Date   CHOL 179 11/12/2010   CHOL 191 03/20/2009   CHOL 185 12/01/2008   Lab Results  Component Value Date   HDL 67.10 11/12/2010   HDL 79.30 03/20/2009   HDL 60 12/01/2008   Lab Results  Component Value Date   LDLCALC 98 11/12/2010   LDLCALC 94 03/20/2009   LDLCALC 97 12/01/2008   Lab Results  Component Value Date   TRIG 72.0 11/12/2010   TRIG 90.0 03/20/2009   TRIG 139 12/01/2008   Lab Results  Component Value Date   CHOLHDL 3 11/12/2010   CHOLHDL 2 03/20/2009   CHOLHDL 3.1 Ratio 12/01/2008   Lab Results  Component Value Date   LDLDIRECT 237.4 07/14/2008   LDLDIRECT 237.6 11/01/2007   LDLDIRECT 228.4 08/16/2007     Patient Active Problem List  Diagnoses  . UNSPECIFIED VITAMIN D DEFICIENCY  . HYPERLIPIDEMIA  . HYPERTENSION  . CVA  . ASTHMA  . GERD  . G I BLEED  . OSTEOARTHRITIS, MULTIPLE JOINTS  . OSTEOPENIA  . SLEEP APNEA  . HEADACHE  . DYSPNEA  . DEEP  VENOUS THROMBOPHLEBITIS, HX OF  . ADVERSE REACTION TO MEDICATION   Past Medical History  Diagnosis Date  . Anemia     in past (from blood loss at hiatal hernia)  . Hiatal hernia   . Charcot-Marie-Tooth disease     walks with cane (fairly controlled)  . OA (ocular albinism)   . Asthma   . Diverticulosis   . Headache     migraine  . GERD (gastroesophageal reflux disease)   . PUD (peptic ulcer disease)     gastric, past  . Hypertension   . Hyperlipidemia   . DVT (deep venous thrombosis) 2003    after sx  . Urine incontinence   . Grief reaction     on fluoxetine  . Osteopenia   . Shingles   . Stroke 8/10    posterior reversible encephalopathy syndrome   Past Surgical History  Procedure Date  . Hiatal hernia repair 2000    needed blood transfusion  . Total knee arthroplasty 03,06    Bilateral  . Vesicovaginal fistula closure w/ tah 1964    with bleeding and cervical dysplasia  .  Hernia repair 2002    bleeding  . Colonoscopy 2002  . Stroke 8/10    hosp stroke/ posterior (vs posterior reversible encephalopathy syndrome) - d/c on Plavix  . Tee normal 8/10    no emoblic source  . Carotid dopplers 8/10   History  Substance Use Topics  . Smoking status: Never Smoker   . Smokeless tobacco: Not on file  . Alcohol Use: No   Family History  Problem Relation Age of Onset  . Cerebral aneurysm Son   . Cerebral aneurysm Father   . Stroke Mother   . Cervical cancer Sister   . Hyperlipidemia Father   . Alcohol abuse Father   . Hyperlipidemia Brother   . Alcohol abuse Brother   . Stroke Brother   . Diabetes Brother   . Diabetes Sister   . Sarcoidosis Sister   . Other Sister     OP/ Broken hip  . Other      whole family has charcot marie tooth  . Ovarian cancer Sister    Allergies  Allergen Reactions  . Amlodipine Besylate     REACTION: edema  . Atorvastatin     REACTION: pain- could not walk  . Ciprofloxacin     REACTION: unkown  . Codeine     REACTION:  itchy rash  . Nitrofurantoin   . Omeprazole     REACTION: not effective  . Pantoprazole Sodium     REACTION: does not work well  . Penicillins     REACTION: itchy rash  . Prednisone     REACTION: RASH.// SHORTNESS OF BREATH  . Simvastatin     REACTION: leg pain  . Sulfonamide Derivatives     REACTION: itchy rash   Current Outpatient Prescriptions on File Prior to Visit  Medication Sig Dispense Refill  . acetaminophen (TYLENOL) 500 MG tablet Take 500 mg by mouth every 6 (six) hours as needed.        . Cholecalciferol (VITAMIN D) 2000 UNITS CAPS Take 2 capsules by mouth daily.        . clopidogrel (PLAVIX) 75 MG tablet Take 1 tablet (75 mg total) by mouth daily.  90 tablet  3  . esomeprazole (NEXIUM) 40 MG capsule Take 1 capsule (40 mg total) by mouth daily before breakfast.  90 capsule  0  . Flaxseed, Linseed, (FLAX SEED OIL) 1000 MG CAPS Take 1 capsule by mouth daily.        Marland Kitchen FLUoxetine (PROZAC) 20 MG tablet Take 20 mg by mouth daily.        . Misc. Devices (WALKER) MISC Wheeled walker with seat- Pt must use for ambulation-- for use as directed: ambulation dx: 729.5, 780.79, 786.05, 784.0, 715.89.       Marland Kitchen omega-3 acid ethyl esters (LOVAZA) 1 G capsule Take 1 capsule (1 g total) by mouth daily.  30 capsule  11  . spironolactone (ALDACTONE) 50 MG tablet Take 50 mg by mouth daily.        Marland Kitchen albuterol (PROVENTIL HFA) 108 (90 BASE) MCG/ACT inhaler Inhale 2 puffs into the lungs every 4 (four) hours as needed.        . ergocalciferol (VITAMIN D2) 50000 UNITS capsule Take 50,000 Units by mouth once a week.          Review of Systems Review of Systems  Constitutional: Negative for fever, appetite change, fatigue and unexpected weight change.  Eyes: Negative for pain and pos for vision disturbance Respiratory: Negative for cough and  shortness of breath.   Cardiovascular: Negative for cp or palpitations    Gastrointestinal: Negative for nausea, diarrhea and constipation.  Genitourinary:  Negative for urgency and frequency.  Skin: Negative for pallor or rash   Neurological: Negative for weakness, light-headedness, numbness and pos for occas headaches that are better Hematological: Negative for adenopathy. Does not bruise/bleed easily.  Psychiatric/Behavioral: Negative for dysphoric mood. The patient is not nervous/anxious.          Objective:   Physical Exam  Constitutional: She appears well-developed and well-nourished. No distress.       Obese well appearing female in chair  HENT:  Head: Normocephalic and atraumatic.  Mouth/Throat: Oropharynx is clear and moist.  Eyes: Conjunctivae and EOM are normal. Pupils are equal, round, and reactive to light.       Vision is poor   Neck: Normal range of motion. Neck supple. No JVD present. Carotid bruit is not present. No thyromegaly present.  Cardiovascular: Normal rate, regular rhythm, normal heart sounds and intact distal pulses.  Exam reveals no gallop.   Pulmonary/Chest: Effort normal and breath sounds normal. No respiratory distress. She has no wheezes.  Abdominal: Soft. Bowel sounds are normal. She exhibits no distension, no abdominal bruit and no mass. There is no tenderness.  Musculoskeletal: She exhibits no edema.  Lymphadenopathy:    She has no cervical adenopathy.  Neurological: She is alert. She has normal reflexes. No cranial nerve deficit. She exhibits normal muscle tone. Coordination normal.  Skin: Skin is warm and dry. No rash noted. No erythema. No pallor.  Psychiatric: She has a normal mood and affect.       Pleasant and talkative today          Assessment & Plan:

## 2010-11-22 NOTE — Patient Instructions (Signed)
I'm glad you are doing so well  Sent some refils to your pharmacy  Flu shot today  Think about chair exercise to help loose weight  Follow up for 30 minute annual exam in 6 months with labs prior

## 2010-11-24 NOTE — Assessment & Plan Note (Signed)
Lifelong and improved after partial resolution of last posteror CVA Doing much better overall

## 2010-11-24 NOTE — Assessment & Plan Note (Signed)
Good control with crestor and diet  Rev last labs with pt  Rev low sat fat diet  Re check 6 mo and f/u

## 2010-11-24 NOTE — Assessment & Plan Note (Signed)
Improved with current otc 4000 iu day of vit D This also makes her feel better

## 2010-11-24 NOTE — Assessment & Plan Note (Signed)
Good control- with imp in ha too  No change in meds Labs rev with pt  Suggested chair exercise and wt loss

## 2010-12-09 IMAGING — CT CT HEAD W/O CM
1 series · 16 of 30 positions shown, 20 images · non-contrast
Comparison: Brain MRI and CTs 09/22/2008 and earlier.

CLINICAL DATA: 78-year-old female with facial droop and acute
weakness.  Possible stroke.

CT HEAD WITHOUT CONTRAST
TECHNIQUE: Contiguous axial images were obtained from the base of
the skull through the vertex without contrast.

[Series 2: brain · axial · 0.47mm/px · z∈[+122,+258]mm · 16 of 30 slices shown, 20 images]
[im 2/30  brain]
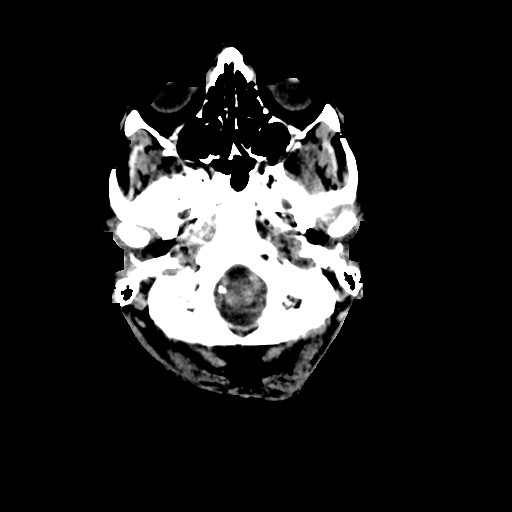
[im 2/30  bone]
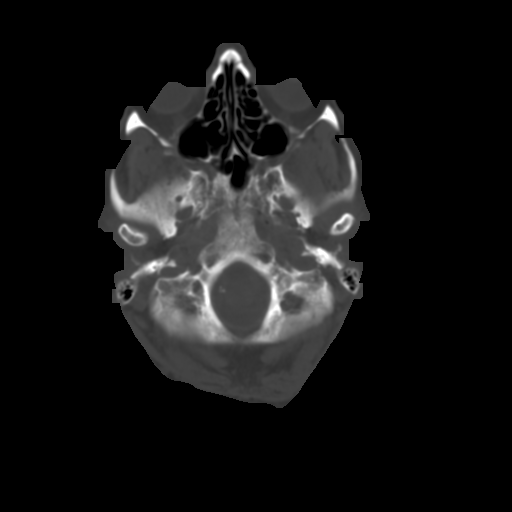
[im 4/30  brain]
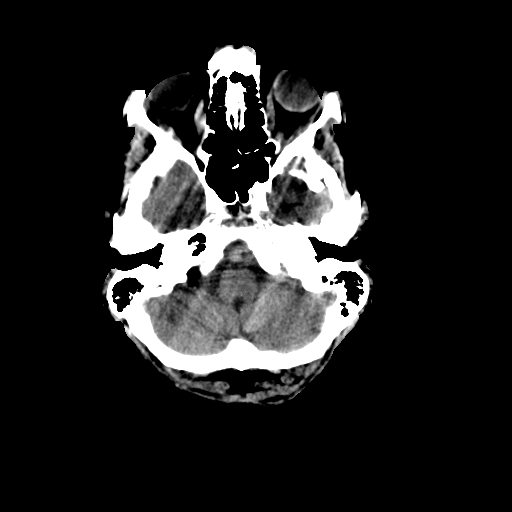
[im 6/30  brain]
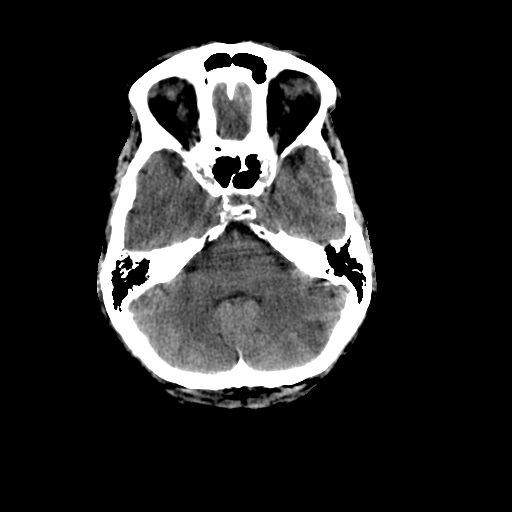
[im 8/30  brain]
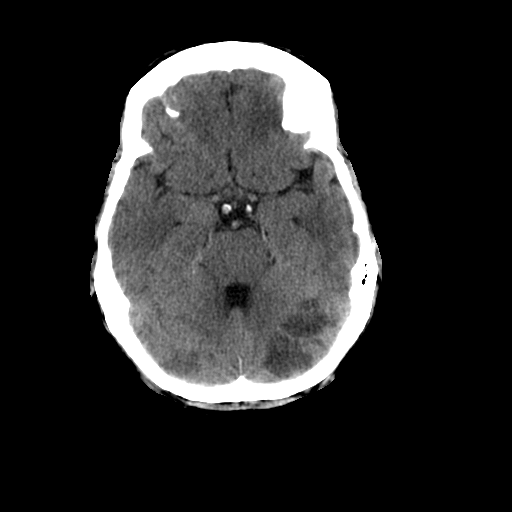
[im 9/30  brain]
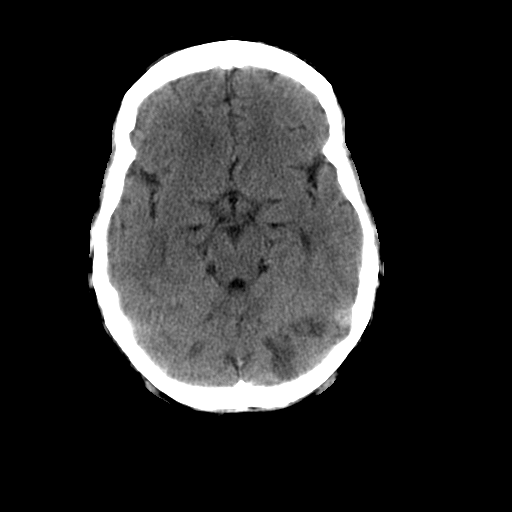
[im 9/30  bone]
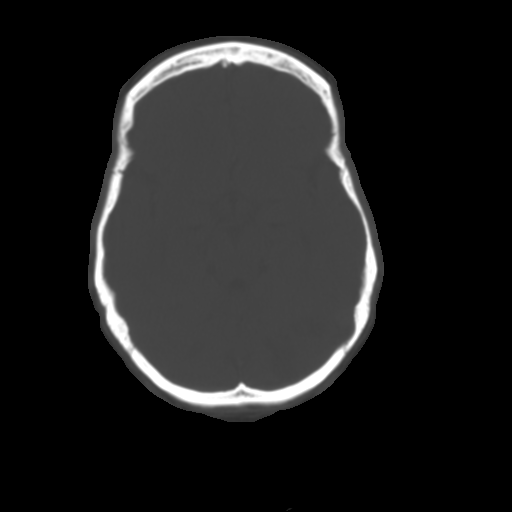
[im 11/30  brain]
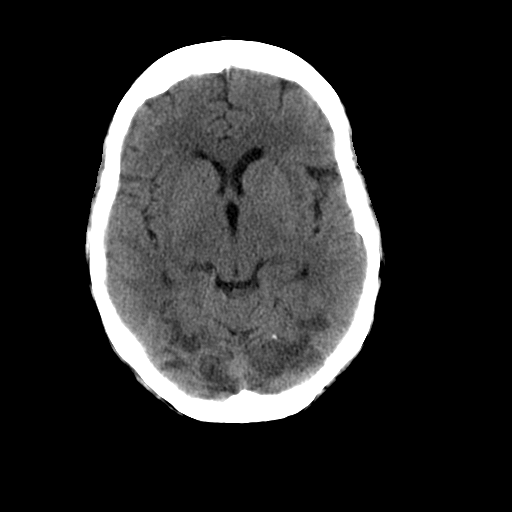
[im 13/30  brain]
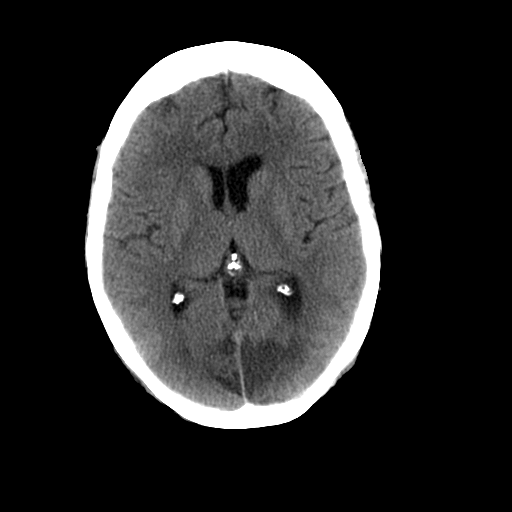
[im 15/30  brain]
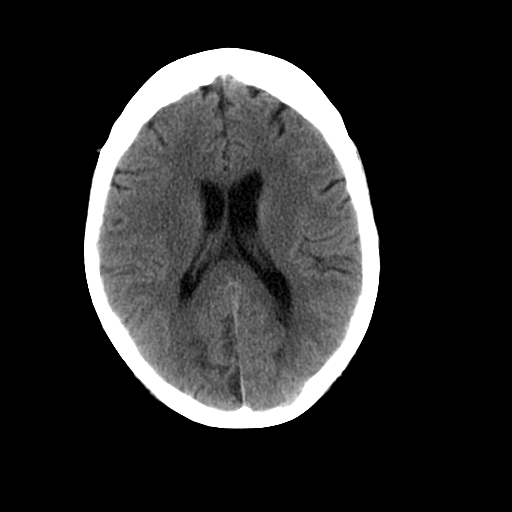
[im 16/30  brain]
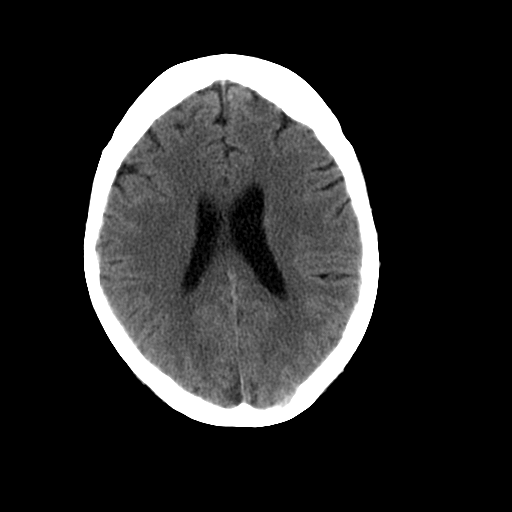
[im 16/30  bone]
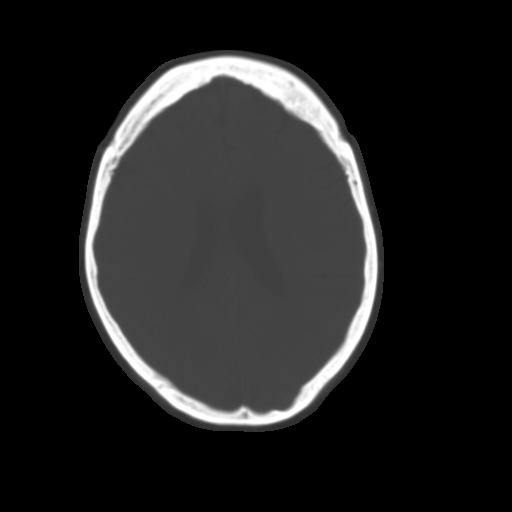
[im 18/30  brain]
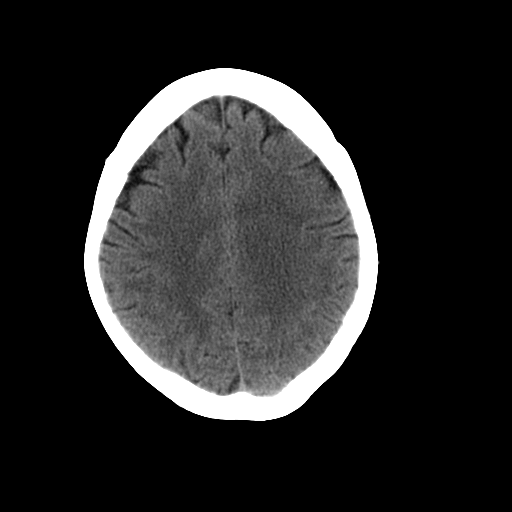
[im 20/30  brain]
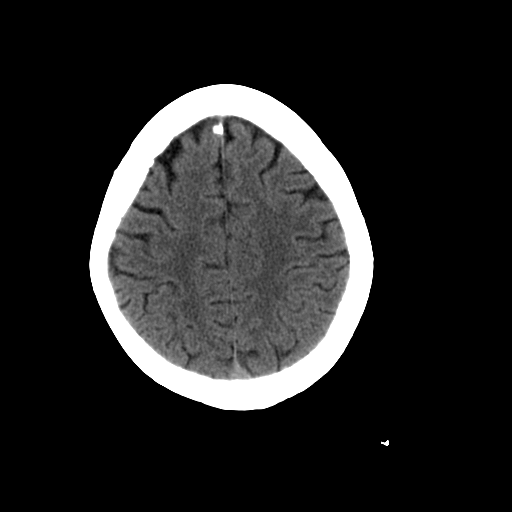
[im 22/30  brain]
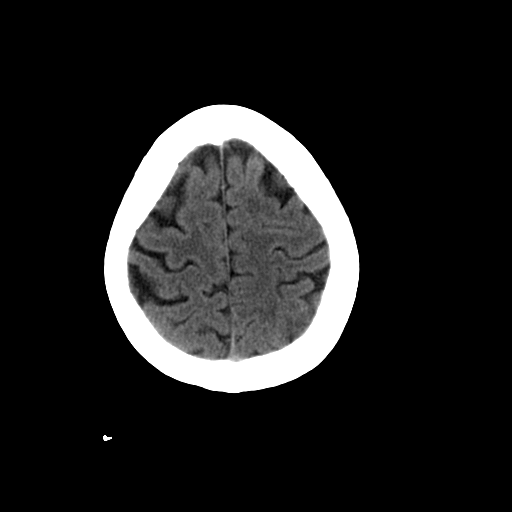
[im 23/30  brain]
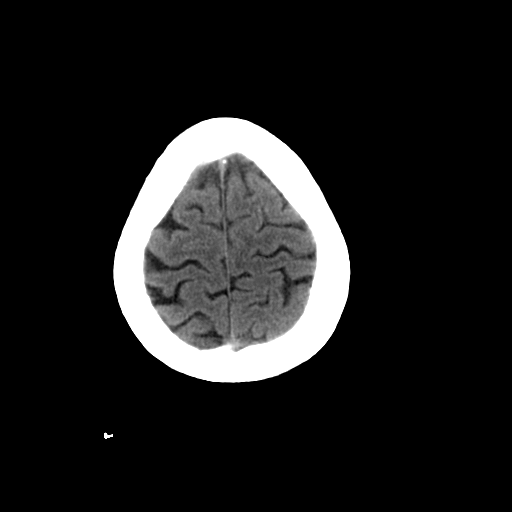
[im 23/30  bone]
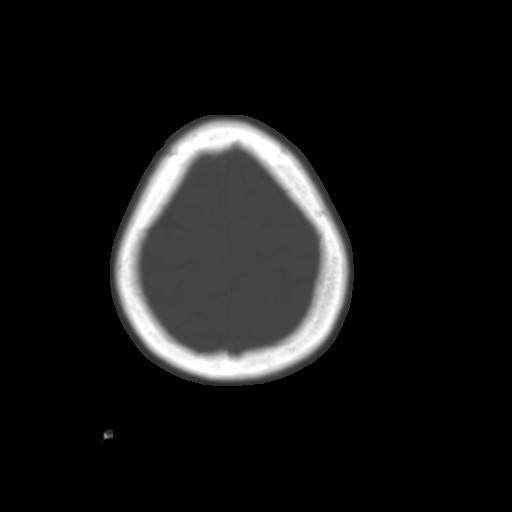
[im 25/30  brain]
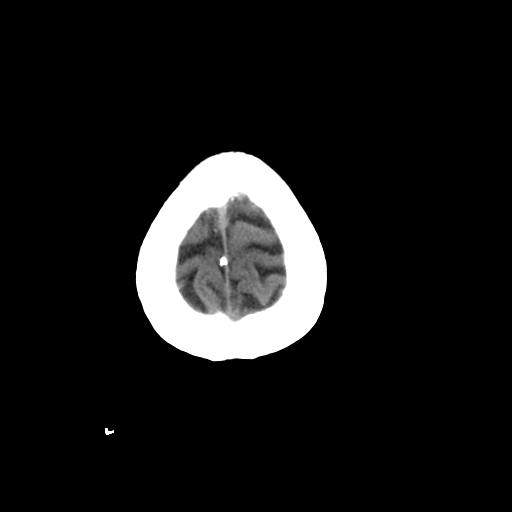
[im 27/30  brain]
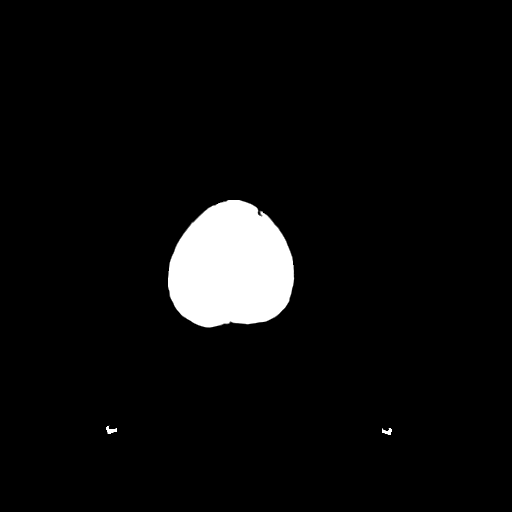
[im 29/30  brain]
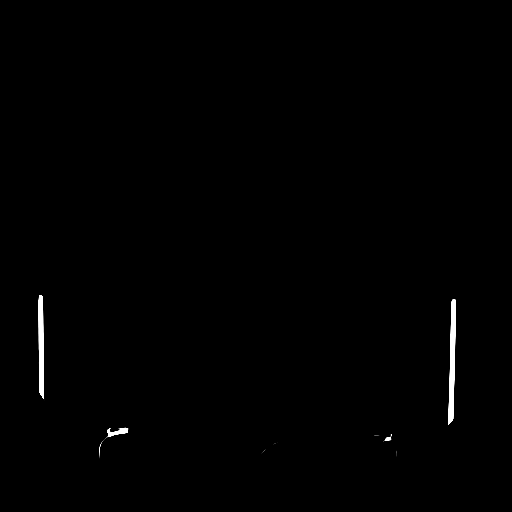

[16 of 30 positions shown; findings below may reference images not displayed]

FINDINGS: Visualized orbits and scalp soft tissues are within
normal limits.  Calcified atherosclerosis at the skull base.
Visualized paranasal sinuses and mastoids are clear.  No acute
osseous abnormality identified.

Multi focal posterior circulation infarcts sequelae are identified
with bilateral cerebellar and occipital pole involvement.
Configuration of parenchymal involvement is similar to that seen on
the MRI dated 09/08/2008.  No mass effect or hemorrhagic
transformation.  No mass lesion. Stable visualized osseous
structures.  Supratentorial gray-white matter differentiation
remains within normal limits.
IMPRESSION: 1.  Sequelae of advanced posterior circulation ischemia again
noted.
2.  No new abnormality identified by CT.  MRI would be more
sensitive.

## 2011-01-03 ENCOUNTER — Other Ambulatory Visit: Payer: Self-pay | Admitting: Family Medicine

## 2011-01-13 ENCOUNTER — Ambulatory Visit (INDEPENDENT_AMBULATORY_CARE_PROVIDER_SITE_OTHER): Payer: Medicare Other | Admitting: Internal Medicine

## 2011-01-13 ENCOUNTER — Encounter: Payer: Self-pay | Admitting: Internal Medicine

## 2011-01-13 ENCOUNTER — Telehealth: Payer: Self-pay | Admitting: *Deleted

## 2011-01-13 VITALS — BP 140/84 | HR 70 | Temp 98.3°F | Ht 60.0 in | Wt 198.0 lb

## 2011-01-13 DIAGNOSIS — J019 Acute sinusitis, unspecified: Secondary | ICD-10-CM

## 2011-01-13 MED ORDER — FLUCONAZOLE 150 MG PO TABS: 150.0000 mg | ORAL_TABLET | Freq: Once | ORAL | Status: AC

## 2011-01-13 MED ORDER — AZITHROMYCIN 250 MG PO TABS: ORAL_TABLET | ORAL | Status: AC

## 2011-01-13 NOTE — Telephone Encounter (Signed)
Needs to be seen - if she is worried about waiting room exposure- they can give her a mask to wear while she is here

## 2011-01-13 NOTE — Telephone Encounter (Signed)
Pt c/o sinus allergy flare-up and request Rx to pharmacy; "does not want to come in to office to avoid flu".

## 2011-01-13 NOTE — Assessment & Plan Note (Signed)
Likely early bacterial secondary infection Discussed supportive care She has done well with zpak---no history of CAD or rhythm problems. Will go ahead with this

## 2011-01-13 NOTE — Telephone Encounter (Signed)
Patient's daughter Dondra Spry notified as instructed by telephone. Pt has appt with Dr Alphonsus Sias today at 2:15pm.

## 2011-01-13 NOTE — Progress Notes (Signed)
Subjective:    Patient ID: Lydia King, female    DOB: 10/22/1930, 77 y.o.   MRN: [id number]  HPI Here with Dondra Spry her daughter Lisabeth Devoid she has sinus infection Head is stopped up Throat is scratchy and dry from drainage Hacky cough Pounding in head with heartbeat  Has had nasty drainage--bloody at times from nose Started 5-6 days ago---worse today No clear fever Did have sweat and shake the first night  Tried saline rinse and chlorseptic---this helped throat pain some Some tylenol as well  Current Outpatient Prescriptions on File Prior to Visit  Medication Sig Dispense Refill  . acetaminophen (TYLENOL) 500 MG tablet Take 500 mg by mouth every 6 (six) hours as needed.        . Cholecalciferol (VITAMIN D) 2000 UNITS CAPS Take 2 capsules by mouth daily.        . clopidogrel (PLAVIX) 75 MG tablet Take 1 tablet (75 mg total) by mouth daily.  90 tablet  3  . Flaxseed, Linseed, (FLAX SEED OIL) 1000 MG CAPS Take 1 capsule by mouth daily.        Marland Kitchen FLUoxetine (PROZAC) 20 MG tablet Take 20 mg by mouth daily.        Marland Kitchen losartan (COZAAR) 100 MG tablet Take 0.5 tablets (50 mg total) by mouth daily.  45 tablet  3  . Misc. Devices (WALKER) MISC Wheeled walker with seat- Pt must use for ambulation-- for use as directed: ambulation dx: 729.5, 780.79, 786.05, 784.0, 715.89.       Marland Kitchen NEXIUM 40 MG capsule TAKE ONE CAPSULE BY MOUTH EVERY DAY  30 capsule  11  . omega-3 acid ethyl esters (LOVAZA) 1 G capsule Take 1 capsule (1 g total) by mouth daily.  30 capsule  11  . rosuvastatin (CRESTOR) 10 MG tablet Take 1 tablet (10 mg total) by mouth at bedtime.  90 tablet  3  . spironolactone (ALDACTONE) 50 MG tablet Take 50 mg by mouth daily.          Allergies  Allergen Reactions  . Amlodipine Besylate     REACTION: edema  . Atorvastatin     REACTION: pain- could not walk  . Ciprofloxacin     REACTION: unkown  . Codeine     REACTION: itchy rash  . Nitrofurantoin   . Omeprazole     REACTION: not  effective  . Pantoprazole Sodium     REACTION: does not work well  . Penicillins     REACTION: itchy rash  . Prednisone     REACTION: RASH.// SHORTNESS OF BREATH  . Simvastatin     REACTION: leg pain  . Sulfonamide Derivatives     REACTION: itchy rash    Past Medical History  Diagnosis Date  . Anemia     in past (from blood loss at hiatal hernia)  . Hiatal hernia   . Charcot-Marie-Tooth disease     walks with cane (fairly controlled)  . OA (ocular albinism)   . Asthma   . Diverticulosis   . Headache     migraine  . GERD (gastroesophageal reflux disease)   . PUD (peptic ulcer disease)     gastric, past  . Hypertension   . Hyperlipidemia   . DVT (deep venous thrombosis) 2003    after sx  . Urine incontinence   . Grief reaction     on fluoxetine  . Osteopenia   . Shingles   . Stroke 8/10    posterior reversible encephalopathy  syndrome    Past Surgical History  Procedure Date  . Hiatal hernia repair 2000    needed blood transfusion  . Total knee arthroplasty 03,06    Bilateral  . Vesicovaginal fistula closure w/ tah 1964    with bleeding and cervical dysplasia  . Hernia repair 2002    bleeding  . Colonoscopy 2002  . Stroke 8/10    hosp stroke/ posterior (vs posterior reversible encephalopathy syndrome) - d/c on Plavix  . Tee normal 8/10    no emoblic source  . Carotid dopplers 8/10    Family History  Problem Relation Age of Onset  . Cerebral aneurysm Son   . Cerebral aneurysm Father   . Stroke Mother   . Cervical cancer Sister   . Hyperlipidemia Father   . Alcohol abuse Father   . Hyperlipidemia Brother   . Alcohol abuse Brother   . Stroke Brother   . Diabetes Brother   . Diabetes Sister   . Sarcoidosis Sister   . Other Sister     OP/ Broken hip  . Other      whole family has charcot marie tooth  . Ovarian cancer Sister     History   Social History  . Marital Status: Widowed    Spouse Name: N/A    Number of Children: N/A  . Years of  Education: N/A   Occupational History  . Not on file.   Social History Main Topics  . Smoking status: Never Smoker   . Smokeless tobacco: Not on file  . Alcohol Use: No  . Drug Use: Not on file  . Sexually Active: Not on file   Other Topics Concern  . Not on file   Social History Narrative   [id number] died 2 years ago cerebral anerysm   Review of Systems No vomiting or diarrhea Appetite is okay No rash     Objective:   Physical Exam  Constitutional: She appears well-developed and well-nourished. No distress.  HENT:  Mouth/Throat: Oropharynx is clear and moist. No oropharyngeal exudate.       Marked maxillary and frontal tenderness Moderate nasal congestion TMs are fine  Neck: Normal range of motion. Neck supple. No thyromegaly present.       ?1 palpable submandibular node on left--slightly tender  Pulmonary/Chest: Effort normal and breath sounds normal. No respiratory distress. She has no wheezes. She has no rales.          Assessment & Plan:

## 2011-01-17 ENCOUNTER — Ambulatory Visit (INDEPENDENT_AMBULATORY_CARE_PROVIDER_SITE_OTHER): Payer: Medicare Other | Admitting: Family Medicine

## 2011-01-17 ENCOUNTER — Encounter: Payer: Self-pay | Admitting: Family Medicine

## 2011-01-17 DIAGNOSIS — J019 Acute sinusitis, unspecified: Secondary | ICD-10-CM

## 2011-01-17 NOTE — Patient Instructions (Signed)
Finish the antibiotics, use nasal saline several times a day and use the veramyst (1 spray in each nostril once a day).  Drink plenty of fluids, take tylenol as needed, and gargle with warm salt water for your throat.  This should gradually improve.  Take care.  Let us know if you have other concerns.

## 2011-01-17 NOTE — Progress Notes (Signed)
Was seen Monday, started on zithromax Monday per Dr. Alphonsus Sias.  Still with rhinorrhea and congestion.  Facial pain, sinus pressure.  Diffuse aches.  No fevers. Does have ear pain.  Using nasal saline.  Nasal discharge is clear now.  Facial pressure is worse today. Chest isn't tight, ST is better.    Meds, vitals, and allergies reviewed.   ROS: See HPI.  Otherwise, noncontributory.  Physical Exam  Constitutional: She appears well-developed and well-nourished. No distress.  HENT:  Mouth/Throat: Oropharynx is clear and moist. No oropharyngeal exudate.  Maxillary and frontal tenderness Moderate nasal congestion TMs are fine  Neck: Normal range of motion. Neck supple. No thyromegaly present.  No LA Pulmonary/Chest: Effort normal and breath sounds normal. No respiratory distress. She has no wheezes. She has no rales.

## 2011-01-19 NOTE — Assessment & Plan Note (Signed)
Given the timeline of the current abx, the fact that she isn't nontoxic and her intolerance list, I would continue as is an see if she didn't improve.  She agrees, f/u prn.  Supportive tx in meantime.

## 2011-01-22 ENCOUNTER — Other Ambulatory Visit: Payer: Self-pay | Admitting: Family Medicine

## 2011-01-22 NOTE — Telephone Encounter (Signed)
[facility] request refill prozac 20 mg #90 x 3.

## 2011-01-30 ENCOUNTER — Telehealth: Payer: Self-pay | Admitting: Internal Medicine

## 2011-01-30 MED ORDER — VALACYCLOVIR HCL 1 G PO TABS: 1000.0000 mg | ORAL_TABLET | Freq: Three times a day (TID) | ORAL | Status: AC

## 2011-01-30 NOTE — Telephone Encounter (Signed)
Informed patient that Rx for Valtrex was sent to pharmacy and to f/up as needed.

## 2011-01-30 NOTE — Telephone Encounter (Signed)
Start valtrex today.  rx sent.  F/u prn.  Thanks.

## 2011-01-30 NOTE — Telephone Encounter (Signed)
Patient states she has shingles they started [place] Year[place] eve on her right breast and now are spreading down the side.  Welps with blisters and they itch and burn.  Please advise,

## 2011-02-10 ENCOUNTER — Other Ambulatory Visit: Payer: Self-pay

## 2011-02-10 MED ORDER — ESOMEPRAZOLE MAGNESIUM 40 MG PO CPDR: 40.0000 mg | DELAYED_RELEASE_CAPSULE | Freq: Every day | ORAL | Status: DC

## 2011-02-10 NOTE — Telephone Encounter (Signed)
[facility] faxed refill request for 90 day supply due to requirement by insurance co. Nexium 40 mg #90 x 3.

## 2011-02-12 ENCOUNTER — Telehealth: Payer: Self-pay | Admitting: Internal Medicine

## 2011-02-12 MED ORDER — LOSARTAN POTASSIUM 100 MG PO TABS: 50.0000 mg | ORAL_TABLET | Freq: Every day | ORAL | Status: DC

## 2011-02-12 NOTE — Telephone Encounter (Signed)
Sent request to pharmacy

## 2011-03-25 ENCOUNTER — Other Ambulatory Visit: Payer: Self-pay | Admitting: *Deleted

## 2011-03-25 MED ORDER — ROSUVASTATIN CALCIUM 10 MG PO TABS: 10.0000 mg | ORAL_TABLET | Freq: Every day | ORAL | Status: DC

## 2011-04-30 ENCOUNTER — Telehealth: Payer: Self-pay

## 2011-04-30 NOTE — Telephone Encounter (Signed)
Is there a form I need to fill out - or does she just need handwritten px/ orders ?

## 2011-04-30 NOTE — Telephone Encounter (Signed)
Lydia King pts daughter request doctors order for pts personal pad for weak bladder(pt uses day and night),lift chair for pt to get in and out of chair(already purchased),Tums for reflux and Tylenol for pain. The doctor order is for re-certification of pts apartment and will help to lower pts monthly cost of her apartment. Lydia King can be reached at [phone].

## 2011-04-30 NOTE — Telephone Encounter (Signed)
Inocencio [employer] called back and requested the fact that pt has to hire someone everyother month to do heavy cleaning in her apartment due to limited mobility and arthritis in hand be added to letter.

## 2011-04-30 NOTE — Telephone Encounter (Signed)
Will put letter in IN box 

## 2011-04-30 NOTE — Telephone Encounter (Signed)
Spoke with Lydia King via telephone and she stated that a letter needs to be written including all of the information as stated below.  Patient uses a lift chair, patient has a weak bladder, and is on Tylenol and Tums.  Lydia King will pick up the letter when it is ready.

## 2011-05-01 NOTE — Telephone Encounter (Signed)
Left message on cell phone voicemail for Lydia King that letter is ready for pick up will be left at front desk.

## 2011-09-02 ENCOUNTER — Telehealth: Payer: Self-pay | Admitting: *Deleted

## 2011-09-02 MED ORDER — OMEGA-3-ACID ETHYL ESTERS 1 G PO CAPS: 1.0000 g | ORAL_CAPSULE | Freq: Every day | ORAL | Status: DC

## 2011-09-02 NOTE — Telephone Encounter (Signed)
Faxed request asking for LOVAZA 1 gm capsule take 1 once daily, medication not on active med list, it is on historic med list, date written 09/20/2010, last filled 06/08/2011. Please advise

## 2011-09-02 NOTE — Telephone Encounter (Signed)
Yes, currently taking Lovaza.

## 2011-09-02 NOTE — Telephone Encounter (Signed)
Done electronically and sent to [facility]

## 2011-09-02 NOTE — Telephone Encounter (Signed)
Please call her and verify before I fill it, thanks

## 2011-09-03 ENCOUNTER — Other Ambulatory Visit: Payer: Self-pay | Admitting: *Deleted

## 2011-09-03 MED ORDER — OMEGA-3-ACID ETHYL ESTERS 1 G PO CAPS: 1.0000 g | ORAL_CAPSULE | Freq: Every day | ORAL | Status: DC

## 2011-09-03 NOTE — Telephone Encounter (Signed)
Will change to 90 day  Will refill electronically She needs labs and then f/u in October please (30 min visit)

## 2011-09-03 NOTE — Telephone Encounter (Signed)
Received a faxed note from pharmacy requesting to change to a 90 day supply.  Please advise when patient is due for an appointment or lab work.

## 2011-09-08 NOTE — Telephone Encounter (Signed)
Patient informed. 

## 2011-10-23 ENCOUNTER — Other Ambulatory Visit: Payer: Self-pay | Admitting: *Deleted

## 2011-10-23 MED ORDER — CLOPIDOGREL BISULFATE 75 MG PO TABS: 75.0000 mg | ORAL_TABLET | Freq: Every day | ORAL | Status: DC

## 2011-11-12 ENCOUNTER — Telehealth: Payer: Self-pay | Admitting: Family Medicine

## 2011-11-12 DIAGNOSIS — I10 Essential (primary) hypertension: Secondary | ICD-10-CM

## 2011-11-12 DIAGNOSIS — E785 Hyperlipidemia, unspecified: Secondary | ICD-10-CM

## 2011-11-12 DIAGNOSIS — K922 Gastrointestinal hemorrhage, unspecified: Secondary | ICD-10-CM

## 2011-11-12 DIAGNOSIS — E559 Vitamin D deficiency, unspecified: Secondary | ICD-10-CM

## 2011-11-12 DIAGNOSIS — M899 Disorder of bone, unspecified: Secondary | ICD-10-CM

## 2011-11-12 DIAGNOSIS — Z8719 Personal history of other diseases of the digestive system: Secondary | ICD-10-CM

## 2011-11-12 NOTE — Telephone Encounter (Signed)
Message copied by Judy Pimple on Wed Nov 12, 2011  8:02 AM ------      Message from: Alvina Chou      Created: Thu Nov 06, 2011 11:44 AM      Regarding: Lab orders for Thursday 10.17.13       Labs for f/u appt

## 2011-11-13 ENCOUNTER — Other Ambulatory Visit (INDEPENDENT_AMBULATORY_CARE_PROVIDER_SITE_OTHER): Payer: Medicare Other

## 2011-11-13 DIAGNOSIS — K922 Gastrointestinal hemorrhage, unspecified: Secondary | ICD-10-CM

## 2011-11-13 DIAGNOSIS — Z8719 Personal history of other diseases of the digestive system: Secondary | ICD-10-CM | POA: Diagnosis not present

## 2011-11-13 DIAGNOSIS — M949 Disorder of cartilage, unspecified: Secondary | ICD-10-CM

## 2011-11-13 DIAGNOSIS — E559 Vitamin D deficiency, unspecified: Secondary | ICD-10-CM | POA: Diagnosis not present

## 2011-11-13 DIAGNOSIS — M899 Disorder of bone, unspecified: Secondary | ICD-10-CM | POA: Diagnosis not present

## 2011-11-13 DIAGNOSIS — E785 Hyperlipidemia, unspecified: Secondary | ICD-10-CM | POA: Diagnosis not present

## 2011-11-13 DIAGNOSIS — I10 Essential (primary) hypertension: Secondary | ICD-10-CM | POA: Diagnosis not present

## 2011-11-13 LAB — CBC WITH DIFFERENTIAL/PLATELET
Basophils Absolute: 0 10*3/uL (ref 0.0–0.1)
Basophils Relative: 0.5 % (ref 0.0–3.0)
Eosinophils Absolute: 0.1 10*3/uL (ref 0.0–0.7)
HCT: 39.7 % (ref 36.0–46.0)
Hemoglobin: 13.1 g/dL (ref 12.0–15.0)
Lymphocytes Relative: 35 % (ref 12.0–46.0)
Lymphs Abs: 1.5 10*3/uL (ref 0.7–4.0)
MCHC: 32.9 g/dL (ref 30.0–36.0)
Monocytes Relative: 6.6 % (ref 3.0–12.0)
Neutro Abs: 2.4 10*3/uL (ref 1.4–7.7)
RDW: 13.2 % (ref 11.5–14.6)

## 2011-11-13 LAB — COMPREHENSIVE METABOLIC PANEL
ALT: 18 U/L (ref 0–35)
BUN: 26 mg/dL — ABNORMAL HIGH (ref 6–23)
CO2: 27 mEq/L (ref 19–32)
Creatinine, Ser: 0.9 mg/dL (ref 0.4–1.2)
GFR: 65.84 mL/min (ref >60.00)
Total Bilirubin: 0.3 mg/dL (ref 0.3–1.2)

## 2011-11-13 LAB — LIPID PANEL
Cholesterol: 153 mg/dL (ref 0–200)
HDL: 50.7 mg/dL (ref >39.00)
Triglycerides: 69 mg/dL (ref 0.0–149.0)
VLDL: 13.8 mg/dL (ref 0.0–40.0)

## 2011-11-13 LAB — TSH: TSH: 1.4 u[IU]/mL (ref 0.35–5.50)

## 2011-11-14 ENCOUNTER — Ambulatory Visit: Payer: Medicare Other | Admitting: Family Medicine

## 2011-11-18 ENCOUNTER — Ambulatory Visit (INDEPENDENT_AMBULATORY_CARE_PROVIDER_SITE_OTHER): Payer: Medicare Other | Admitting: Family Medicine

## 2011-11-18 ENCOUNTER — Encounter: Payer: Self-pay | Admitting: Family Medicine

## 2011-11-18 ENCOUNTER — Ambulatory Visit: Payer: Medicare Other | Admitting: Family Medicine

## 2011-11-18 VITALS — BP 124/58 | HR 76 | Temp 98.5°F | Ht 60.0 in | Wt 199.5 lb

## 2011-11-18 DIAGNOSIS — Z23 Encounter for immunization: Secondary | ICD-10-CM | POA: Diagnosis not present

## 2011-11-18 DIAGNOSIS — I10 Essential (primary) hypertension: Secondary | ICD-10-CM

## 2011-11-18 DIAGNOSIS — E785 Hyperlipidemia, unspecified: Secondary | ICD-10-CM

## 2011-11-18 DIAGNOSIS — E559 Vitamin D deficiency, unspecified: Secondary | ICD-10-CM | POA: Diagnosis not present

## 2011-11-18 NOTE — Progress Notes (Signed)
Subjective:    Patient ID: Lydia King, female    DOB: 1930-05-28, 78 y.o.   MRN: [id number]  HPI  Here for fu of chronic health problems   If feeling great !  Nothing new going on    bp is stable today  No cp or palpitations or headaches or edema  No side effects to medicines  BP Readings from Last 3 Encounters:  11/18/11 124/58  01/17/11 158/80  01/13/11 140/84     Headaches-none at all   Wt is up 3 lb with bmi of 38 Limited mobility due to charcot marie tooth dz Is getting around with her cane and someone holds her arm if she goes out  Has a walker at home -- to go outside her home  Takes tylenol for pain when she needs it     Chemistry      Component Value Date/Time   NA 141 11/13/2011 0841   K 4.9 11/13/2011 0841   CL 109 11/13/2011 0841   CO2 27 11/13/2011 0841   BUN 26* 11/13/2011 0841   CREATININE 0.9 11/13/2011 0841      Component Value Date/Time   CALCIUM 9.2 11/13/2011 0841   ALKPHOS 56 11/13/2011 0841   AST 19 11/13/2011 0841   ALT 18 11/13/2011 0841   BILITOT 0.3 11/13/2011 0841       Platelets down a bit this draw Lab Results  Component Value Date   WBC 4.2* 11/13/2011   HGB 13.1 11/13/2011   HCT 39.7 11/13/2011   MCV 93.4 11/13/2011   PLT 132.0* 11/13/2011   wbc is baseline for her She does bruise on plavix/ no excessive bleeding   Hx of low D and openia Is 57 this check   On crestor and diet for cholesterol Lab Results  Component Value Date   CHOL 153 11/13/2011   CHOL 179 11/12/2010   CHOL 191 03/20/2009   Lab Results  Component Value Date   HDL 50.70 11/13/2011   HDL 67.10 11/12/2010   HDL 79.30 03/20/2009   Lab Results  Component Value Date   LDLCALC 89 11/13/2011   LDLCALC 98 11/12/2010   LDLCALC 94 03/20/2009   Lab Results  Component Value Date   TRIG 69.0 11/13/2011   TRIG 72.0 11/12/2010   TRIG 90.0 03/20/2009   Lab Results  Component Value Date   CHOLHDL 3 11/13/2011   CHOLHDL 3 11/12/2010   CHOLHDL 2  03/20/2009   Lab Results  Component Value Date   LDLDIRECT 237.4 07/14/2008   LDLDIRECT 237.6 11/01/2007   LDLDIRECT 228.4 08/16/2007  no problems with crestor at [employer] covers it   She is cutting back the sweets and bread and eating smaller portions    Flu vaccine- will do that today   Patient Active Problem List  Diagnosis  . UNSPECIFIED VITAMIN D DEFICIENCY  . HYPERLIPIDEMIA  . HYPERTENSION  . CVA  . ASTHMA  . H/O gastroesophageal reflux (GERD)  . G I BLEED  . OSTEOARTHRITIS, MULTIPLE JOINTS  . OSTEOPENIA  . SLEEP APNEA  . HEADACHE  . DYSPNEA  . DEEP VENOUS THROMBOPHLEBITIS, HX OF  . ADVERSE REACTION TO MEDICATION  . Acute sinusitis, unspecified   Past Medical History  Diagnosis Date  . Anemia     in past (from blood loss at hiatal hernia)  . Hiatal hernia   . Charcot-Marie-Tooth disease     walks with cane (fairly controlled)  . OA (ocular albinism)   .  Asthma   . Diverticulosis   . Headache     migraine  . GERD (gastroesophageal reflux disease)   . PUD (peptic ulcer disease)     gastric, past  . Hypertension   . Hyperlipidemia   . DVT (deep venous thrombosis) 2003    after sx  . Urine incontinence   . Grief reaction     on fluoxetine  . Osteopenia   . Shingles   . Stroke 8/10    posterior reversible encephalopathy syndrome   Past Surgical History  Procedure Date  . Hiatal hernia repair 2000    needed blood transfusion  . Total knee arthroplasty 03,06    Bilateral  . Vesicovaginal fistula closure w/ tah 1964    with bleeding and cervical dysplasia  . Hernia repair 2002    bleeding  . Colonoscopy 2002  . Stroke 8/10    hosp stroke/ posterior (vs posterior reversible encephalopathy syndrome) - d/c on Plavix  . Tee normal 8/10    no emoblic source  . Carotid dopplers 8/10   History  Substance Use Topics  . Smoking status: Never Smoker   . Smokeless tobacco: Never Used  . Alcohol Use: No   Family History  Problem Relation Age of  Onset  . Cerebral aneurysm Son   . Cerebral aneurysm Father   . Stroke Mother   . Cervical cancer Sister   . Hyperlipidemia Father   . Alcohol abuse Father   . Hyperlipidemia Brother   . Alcohol abuse Brother   . Stroke Brother   . Diabetes Brother   . Diabetes Sister   . Sarcoidosis Sister   . Other Sister     OP/ Broken hip  . Other      whole family has charcot marie tooth  . Ovarian cancer Sister    Allergies  Allergen Reactions  . Amlodipine Besylate     REACTION: edema  . Atorvastatin     REACTION: pain- could not walk  . Ciprofloxacin     REACTION: unkown  . Codeine     REACTION: itchy rash  . Nitrofurantoin   . Omeprazole     REACTION: not effective  . Pantoprazole Sodium     REACTION: does not work well  . Penicillins     REACTION: itchy rash  . Prednisone     REACTION: RASH.// SHORTNESS OF BREATH  . Simvastatin     REACTION: leg pain  . Sulfonamide Derivatives     REACTION: itchy rash   Current Outpatient Prescriptions on File Prior to Visit  Medication Sig Dispense Refill  . acetaminophen (TYLENOL) 500 MG tablet Take 500 mg by mouth every 6 (six) hours as needed.        . Cholecalciferol (VITAMIN D) 2000 UNITS CAPS Take 2 capsules by mouth daily.        . clopidogrel (PLAVIX) 75 MG tablet Take 1 tablet (75 mg total) by mouth daily.  90 tablet  3  . esomeprazole (NEXIUM) 40 MG capsule Take 1 capsule (40 mg total) by mouth daily before breakfast.  90 capsule  3  . Flaxseed, Linseed, (FLAX SEED OIL) 1000 MG CAPS Take 1 capsule by mouth daily.        Marland Kitchen FLUoxetine (PROZAC) 20 MG capsule TAKE ONE CAPSULE BY MOUTH EVERY DAY  90 capsule  3  . losartan (COZAAR) 100 MG tablet Take 0.5 tablets (50 mg total) by mouth daily.  45 tablet  3  . Misc. Devices Laie)  MISC Wheeled walker with seat- Pt must use for ambulation-- for use as directed: ambulation dx: 729.5, 780.79, 786.05, 784.0, 715.89.       Marland Kitchen omega-3 acid ethyl esters (LOVAZA) 1 G capsule Take 1 capsule  (1 g total) by mouth daily.  90 capsule  3  . rosuvastatin (CRESTOR) 10 MG tablet Take 1 tablet (10 mg total) by mouth at bedtime.  90 tablet  3  . spironolactone (ALDACTONE) 50 MG tablet Take 25 mg by mouth daily.       Marland Kitchen omega-3 acid ethyl esters (LOVAZA) 1 G capsule Take 1 capsule (1 g total) by mouth daily.  30 capsule  11  . DISCONTD: FLUoxetine (PROZAC) 20 MG tablet Take 20 mg by mouth daily.           Review of Systems    Review of Systems  Constitutional: Negative for fever, appetite change, fatigue and unexpected weight change.  Eyes: Negative for pain and visual disturbance.  Respiratory: Negative for cough and shortness of breath.   Cardiovascular: Negative for cp or palpitations    Gastrointestinal: Negative for nausea, diarrhea and constipation.  Genitourinary: Negative for urgency and frequency.  Skin: Negative for pallor or rash   MSK pos for joint pain that is unchanged Neurological: Negative for weakness, light-headedness, numbness and headaches.  Hematological: Negative for adenopathy. Does not bruise/bleed easily.  Psychiatric/Behavioral: Negative for dysphoric mood. The patient is not nervous/anxious.      Objective:   Physical Exam  Constitutional: She appears well-developed and well-nourished. No distress.       obese and well appearing   HENT:  Head: Normocephalic and atraumatic.  Mouth/Throat: Oropharynx is clear and moist. No oropharyngeal exudate.  Eyes: Conjunctivae normal and EOM are normal. Pupils are equal, round, and reactive to light. No scleral icterus.  Neck: Normal range of motion. Neck supple. No JVD present. Carotid bruit is not present. No thyromegaly present.  Cardiovascular: Normal rate, regular rhythm and intact distal pulses.  Exam reveals no gallop.   Pulmonary/Chest: Effort normal and breath sounds normal. No respiratory distress. She has no wheezes.  Abdominal: Soft. Bowel sounds are normal. She exhibits no distension, no abdominal bruit  and no mass. There is no tenderness.  Musculoskeletal: She exhibits no edema and no tenderness.  Lymphadenopathy:    She has no cervical adenopathy.  Neurological: She is alert. She has normal reflexes. No cranial nerve deficit or sensory deficit. She exhibits normal muscle tone. Coordination normal.  Skin: Skin is warm and dry. No rash noted. No erythema. No pallor.  Psychiatric: She has a normal mood and affect.       Pleasant and talkative           Assessment & Plan:

## 2011-11-18 NOTE — Patient Instructions (Addendum)
Labs look good  Blood pressure looks good  Stay as active as you can - try a chair exercise program  Flu vaccine today No change in medicines  Follow up in 6 months for an annual exam with labs prior

## 2011-11-19 NOTE — Assessment & Plan Note (Signed)
D level ok on current supplementation Disc imp to bone and overall health

## 2011-11-19 NOTE — Assessment & Plan Note (Signed)
Good control with crestor and diet- no side eff Disc goals for lipids and reasons to control them Rev labs with pt Rev low sat fat diet in detail

## 2011-11-19 NOTE — Assessment & Plan Note (Signed)
bp in fair control at this time  No changes needed  Disc lifstyle change with low sodium diet and exercise   Pleased with this  Lab reviewed

## 2012-01-26 ENCOUNTER — Other Ambulatory Visit: Payer: Self-pay | Admitting: *Deleted

## 2012-01-26 MED ORDER — FLUOXETINE HCL 20 MG PO CAPS: 20.0000 mg | ORAL_CAPSULE | Freq: Every day | ORAL | Status: DC

## 2012-01-26 NOTE — Telephone Encounter (Signed)
Received faxed refill request, okay to refill?

## 2012-01-26 NOTE — Telephone Encounter (Signed)
done

## 2012-01-26 NOTE — Telephone Encounter (Signed)
She can have 12 months of refils, thanks  

## 2012-02-18 ENCOUNTER — Other Ambulatory Visit: Payer: Self-pay | Admitting: Family Medicine

## 2012-03-18 ENCOUNTER — Other Ambulatory Visit: Payer: Self-pay | Admitting: Family Medicine

## 2012-03-25 ENCOUNTER — Other Ambulatory Visit: Payer: Self-pay | Admitting: Family Medicine

## 2012-04-05 ENCOUNTER — Other Ambulatory Visit: Payer: Self-pay | Admitting: Family Medicine

## 2012-05-18 DIAGNOSIS — H2510 Age-related nuclear cataract, unspecified eye: Secondary | ICD-10-CM | POA: Diagnosis not present

## 2012-08-31 ENCOUNTER — Encounter: Payer: Self-pay | Admitting: Family Medicine

## 2012-08-31 ENCOUNTER — Ambulatory Visit (INDEPENDENT_AMBULATORY_CARE_PROVIDER_SITE_OTHER): Payer: Medicare Other | Admitting: Family Medicine

## 2012-08-31 VITALS — BP 126/68 | HR 75 | Temp 98.8°F | Ht 60.0 in | Wt 195.5 lb

## 2012-08-31 DIAGNOSIS — I10 Essential (primary) hypertension: Secondary | ICD-10-CM

## 2012-08-31 DIAGNOSIS — R7309 Other abnormal glucose: Secondary | ICD-10-CM | POA: Diagnosis not present

## 2012-08-31 DIAGNOSIS — E119 Type 2 diabetes mellitus without complications: Secondary | ICD-10-CM | POA: Insufficient documentation

## 2012-08-31 DIAGNOSIS — E785 Hyperlipidemia, unspecified: Secondary | ICD-10-CM

## 2012-08-31 DIAGNOSIS — R739 Hyperglycemia, unspecified: Secondary | ICD-10-CM

## 2012-08-31 LAB — COMPREHENSIVE METABOLIC PANEL
ALT: 25 U/L (ref 0–35)
Albumin: 3.5 g/dL (ref 3.5–5.2)
CO2: 26 mEq/L (ref 19–32)
Calcium: 9.8 mg/dL (ref 8.4–10.5)
Chloride: 109 mEq/L (ref 96–112)
GFR: 74.42 mL/min (ref >60.00)
Glucose, Bld: 137 mg/dL — ABNORMAL HIGH (ref 70–99)
Potassium: 4.4 mEq/L (ref 3.5–5.1)
Sodium: 141 mEq/L (ref 135–145)
Total Bilirubin: 0.8 mg/dL (ref 0.3–1.2)
Total Protein: 6.4 g/dL (ref 6.0–8.3)

## 2012-08-31 LAB — HEMOGLOBIN A1C: Hgb A1c MFr Bld: 7 % — ABNORMAL HIGH (ref 4.6–6.5)

## 2012-08-31 NOTE — Assessment & Plan Note (Signed)
On crestor and diet  Rev low sat fat diet and last lipid profile

## 2012-08-31 NOTE — Progress Notes (Signed)
Subjective:    Patient ID: Lydia King, female    DOB: [birth year], 80 y.o.   MRN: [id number]  HPI Here for f/u of HTN   bp is stable today  No cp or palpitations or headaches or edema  No side effects to medicines  BP Readings from Last 3 Encounters:  08/31/12 126/68  11/18/11 124/58  01/17/11 158/80     Wt is down 4 lb with bmi of 38 She is working on it - she is eating better and cutting back portions  Can't get much exercise - she has some chair exercise videos but does  not use it much     Chemistry      Component Value Date/Time   NA 141 11/13/2011 0841   K 4.9 11/13/2011 0841   CL 109 11/13/2011 0841   CO2 27 11/13/2011 0841   BUN 26* 11/13/2011 0841   CREATININE 0.9 11/13/2011 0841      Component Value Date/Time   CALCIUM 9.2 11/13/2011 0841   ALKPHOS 56 11/13/2011 0841   AST 19 11/13/2011 0841   ALT 18 11/13/2011 0841   BILITOT 0.3 11/13/2011 0841       Last lipids on [facility] Lab Results  Component Value Date   CHOL 153 11/13/2011   HDL 50.70 11/13/2011   LDLCALC 89 11/13/2011   LDLDIRECT 237.4 07/14/2008   TRIG 69.0 11/13/2011   CHOLHDL 3 11/13/2011    No problems with medicines  No headaches  No stroke symptoms   Last fasting glucose 108  Patient Active Problem List   Diagnosis Date Noted  . CVA 09/12/2008  . OSTEOARTHRITIS, MULTIPLE JOINTS 05/30/2008  . OSTEOPENIA 05/30/2008  . UNSPECIFIED VITAMIN D DEFICIENCY 07/14/2007  . SLEEP APNEA 07/14/2007  . HYPERLIPIDEMIA 05/26/2007  . HYPERTENSION 11/30/2006  . ASTHMA 11/30/2006  . H/O gastroesophageal reflux (GERD) 11/30/2006  . G I BLEED 11/30/2006  . DEEP VENOUS THROMBOPHLEBITIS, HX OF 11/30/2006   Past Medical History  Diagnosis Date  . Anemia     in past (from blood loss at hiatal hernia)  . Hiatal hernia   . Charcot-Marie-Tooth disease     walks with cane (fairly controlled)  . OA (ocular albinism)   . Asthma   . Diverticulosis   . Headache(784.0)     migraine  . GERD  (gastroesophageal reflux disease)   . PUD (peptic ulcer disease)     gastric, past  . Hypertension   . Hyperlipidemia   . DVT (deep venous thrombosis) 2003    after sx  . Urine incontinence   . Grief reaction     on fluoxetine  . Osteopenia   . Shingles   . Stroke 8/10    posterior reversible encephalopathy syndrome   Past Surgical History  Procedure Laterality Date  . Hiatal hernia repair  2000    needed blood transfusion  . Total knee arthroplasty  03,06    Bilateral  . Vesicovaginal fistula closure w/ tah  1964    with bleeding and cervical dysplasia  . Hernia repair  2002    bleeding  . Colonoscopy  2002  . Stroke  8/10    hosp stroke/ posterior (vs posterior reversible encephalopathy syndrome) - d/c on Plavix  . Tee normal  8/10    no emoblic source  . Carotid dopplers  8/10   History  Substance Use Topics  . Smoking status: Never Smoker   . Smokeless tobacco: Never Used  . Alcohol Use: No  Family History  Problem Relation Age of Onset  . Cerebral aneurysm Son   . Cerebral aneurysm Father   . Stroke Mother   . Cervical cancer Sister   . Hyperlipidemia Father   . Alcohol abuse Father   . Hyperlipidemia Brother   . Alcohol abuse Brother   . Stroke Brother   . Diabetes Brother   . Diabetes Sister   . Sarcoidosis Sister   . Other Sister     OP/ Broken hip  . Other      whole family has charcot marie tooth  . Ovarian cancer Sister    Allergies  Allergen Reactions  . Amlodipine Besylate     REACTION: edema  . Atorvastatin     REACTION: pain- could not walk  . Ciprofloxacin     REACTION: unkown  . Codeine     REACTION: itchy rash  . Nitrofurantoin   . Omeprazole     REACTION: not effective  . Pantoprazole Sodium     REACTION: does not work well  . Penicillins     REACTION: itchy rash  . Prednisone     REACTION: RASH.// SHORTNESS OF BREATH  . Simvastatin     REACTION: leg pain  . Sulfonamide Derivatives     REACTION: itchy rash   Current  Outpatient Prescriptions on File Prior to Visit  Medication Sig Dispense Refill  . acetaminophen (TYLENOL) 500 MG tablet Take 500 mg by mouth every 6 (six) hours as needed.        . Cholecalciferol (VITAMIN D) 2000 UNITS CAPS Take 2 capsules by mouth daily.        . clopidogrel (PLAVIX) 75 MG tablet Take 1 tablet (75 mg total) by mouth daily.  90 tablet  3  . CRESTOR 10 MG tablet TAKE 1 TABLET BY MOUTH AT BEDTIME  90 tablet  1  . Flaxseed, Linseed, (FLAX SEED OIL) 1000 MG CAPS Take 1 capsule by mouth daily.        Marland Kitchen FLUoxetine (PROZAC) 20 MG capsule Take 1 capsule (20 mg total) by mouth daily.  90 capsule  3  . losartan (COZAAR) 100 MG tablet TAKE 1/2 TAB BY MOUTH DAILY  45 tablet  3  . Misc. Devices (WALKER) MISC Wheeled walker with seat- Pt must use for ambulation-- for use as directed: ambulation dx: 729.5, 780.79, 786.05, 784.0, 715.89.       Marland Kitchen NEXIUM 40 MG capsule TAKE ONE CAPSULE BY MOUTH IN THE MORNING BEFORE BREAKFAST  90 capsule  1  . omega-3 acid ethyl esters (LOVAZA) 1 G capsule Take 1 capsule (1 g total) by mouth daily.  90 capsule  3  . spironolactone (ALDACTONE) 50 MG tablet TAKE 1 TABLET BY MOUTH EVERY DAY  90 tablet  1   No current facility-administered medications on file prior to visit.     Review of Systems Review of Systems  Constitutional: Negative for fever, appetite change, fatigue and unexpected weight change.  Eyes: Negative for pain and visual disturbance.  Respiratory: Negative for cough and shortness of breath.   Cardiovascular: Negative for cp or palpitations    Gastrointestinal: Negative for nausea, diarrhea and constipation.  Genitourinary: Negative for urgency and frequency. no excessive thirst or urination Skin: Negative for pallor or rash   Neurological: Negative for weakness, light-headedness, numbness and headaches.  Hematological: Negative for adenopathy. Does not bruise/bleed easily.  Psychiatric/Behavioral: Negative for dysphoric mood. The patient is  not nervous/anxious.  Objective:   Physical Exam  Constitutional: She appears well-developed and well-nourished. No distress.  obese and well appearing   HENT:  Head: Normocephalic and atraumatic.  Eyes: Conjunctivae and EOM are normal. Pupils are equal, round, and reactive to light. No scleral icterus.  Neck: Normal range of motion. Neck supple. No JVD present. Carotid bruit is not present. No thyromegaly present.  Cardiovascular: Normal rate, regular rhythm, normal heart sounds and intact distal pulses.  Exam reveals no gallop.   Pulmonary/Chest: Effort normal and breath sounds normal. No respiratory distress. She has no wheezes. She has no rales.  Abdominal: Soft. Bowel sounds are normal. She exhibits no distension, no abdominal bruit and no mass. There is no tenderness.  Musculoskeletal: She exhibits no edema and no tenderness.  Limited rom back and gait is slow with assistance  Lymphadenopathy:    She has no cervical adenopathy.  Neurological: She is alert. She has normal reflexes. No cranial nerve deficit. She exhibits normal muscle tone. Coordination normal.  Skin: Skin is warm and dry. No rash noted. No erythema. No pallor.  Psychiatric: She has a normal mood and affect.          Assessment & Plan:

## 2012-08-31 NOTE — Assessment & Plan Note (Signed)
bp in fair control at this time  No changes needed  Disc lifstyle change with low sodium diet and exercise  Lab today  F/u 6 mo

## 2012-08-31 NOTE — Patient Instructions (Addendum)
I'm glad you are doing well  Keep working on weight loss  Do your chair exercise videos Lab today for blood sugar and chemistries Follow up in 6 months for annual exam with labs prior

## 2012-08-31 NOTE — Assessment & Plan Note (Signed)
A1c today Disc need for wt loss and low glycemic diet

## 2012-09-16 ENCOUNTER — Other Ambulatory Visit: Payer: Self-pay | Admitting: Family Medicine

## 2012-09-16 MED ORDER — OMEGA-3-ACID ETHYL ESTERS 1 G PO CAPS: 1.0000 g | ORAL_CAPSULE | Freq: Every day | ORAL | Status: DC

## 2012-10-08 ENCOUNTER — Other Ambulatory Visit: Payer: Self-pay | Admitting: Family Medicine

## 2012-10-25 ENCOUNTER — Other Ambulatory Visit: Payer: Self-pay | Admitting: Family Medicine

## 2012-11-25 DIAGNOSIS — Z23 Encounter for immunization: Secondary | ICD-10-CM | POA: Diagnosis not present

## 2012-12-02 ENCOUNTER — Other Ambulatory Visit: Payer: Self-pay

## 2013-01-14 DIAGNOSIS — J069 Acute upper respiratory infection, unspecified: Secondary | ICD-10-CM | POA: Diagnosis not present

## 2013-01-25 ENCOUNTER — Other Ambulatory Visit: Payer: Self-pay | Admitting: Family Medicine

## 2013-01-25 NOTE — Telephone Encounter (Signed)
Please refill for 6 months, thanks  

## 2013-01-25 NOTE — Telephone Encounter (Signed)
Last filled 10/30/2012

## 2013-01-26 MED ORDER — FLUOXETINE HCL 20 MG PO CAPS: 20.0000 mg | ORAL_CAPSULE | Freq: Every day | ORAL | Status: DC

## 2013-01-26 NOTE — Telephone Encounter (Signed)
done

## 2013-03-19 ENCOUNTER — Other Ambulatory Visit: Payer: Self-pay | Admitting: Family Medicine

## 2013-03-29 ENCOUNTER — Other Ambulatory Visit: Payer: Self-pay | Admitting: Family Medicine

## 2013-04-08 ENCOUNTER — Other Ambulatory Visit: Payer: Self-pay | Admitting: Family Medicine

## 2013-04-20 ENCOUNTER — Encounter: Payer: Self-pay | Admitting: Family Medicine

## 2013-04-20 ENCOUNTER — Ambulatory Visit (INDEPENDENT_AMBULATORY_CARE_PROVIDER_SITE_OTHER): Payer: Medicare Other | Admitting: Family Medicine

## 2013-04-20 VITALS — BP 126/76 | HR 68 | Temp 98.5°F | Ht 60.0 in | Wt 193.8 lb

## 2013-04-20 DIAGNOSIS — E785 Hyperlipidemia, unspecified: Secondary | ICD-10-CM | POA: Diagnosis not present

## 2013-04-20 DIAGNOSIS — R7309 Other abnormal glucose: Secondary | ICD-10-CM

## 2013-04-20 DIAGNOSIS — I10 Essential (primary) hypertension: Secondary | ICD-10-CM

## 2013-04-20 DIAGNOSIS — R739 Hyperglycemia, unspecified: Secondary | ICD-10-CM

## 2013-04-20 NOTE — Patient Instructions (Signed)
Your last labs indicate new diabetes  We will check your A1C today and see if you are still diabetic  I encourage you to continue the nutritional counseling - we may want to switch to a diabetic educator in the future depending on lab results  Also we may want you to begin checking blood sugar -depending on labs (we will have you call your insurance company before px any equiptment Make sure to get protein with every meal - if you are intolerant of protein rich foods -then use the protein powder you are already taking Blood pressure is good Keep working on weight loss      Type 2 Diabetes Mellitus, Adult Type 2 diabetes mellitus, often simply referred to as type 2 diabetes, is a long-lasting (chronic) disease. In type 2 diabetes, the pancreas does not make enough insulin (a hormone), the cells are less responsive to the insulin that is made (insulin resistance), or both. Normally, insulin moves sugars from food into the tissue cells. The tissue cells use the sugars for energy. The lack of insulin or the lack of normal response to insulin causes excess sugars to build up in the blood instead of going into the tissue cells. As a result, high blood sugar (hyperglycemia) develops. The effect of high sugar (glucose) levels can cause many complications. Type 2 diabetes was also previously called adult-onset diabetes but it can occur at any age.  RISK FACTORS  A person is predisposed to developing type 2 diabetes if someone in the family has the disease and also has one or more of the following primary risk factors:  Overweight.  An inactive lifestyle.  A history of consistently eating high-calorie foods. Maintaining a normal weight and regular physical activity can reduce the chance of developing type 2 diabetes. SYMPTOMS  A person with type 2 diabetes may not show symptoms initially. The symptoms of type 2 diabetes appear slowly. The symptoms include:  Increased thirst  (polydipsia).  Increased urination (polyuria).  Increased urination during the night (nocturia).  Weight loss. This weight loss may be rapid.  Frequent, recurring infections.  Tiredness (fatigue).  Weakness.  Vision changes, such as blurred vision.  Fruity smell to your breath.  Abdominal pain.  Nausea or vomiting.  Cuts or bruises which are slow to heal.  Tingling or numbness in the hands or feet. DIAGNOSIS Type 2 diabetes is frequently not diagnosed until complications of diabetes are present. Type 2 diabetes is diagnosed when symptoms or complications are present and when blood glucose levels are increased. Your blood glucose level may be checked by one or more of the following blood tests:  A fasting blood glucose test. You will not be allowed to eat for at least 8 hours before a blood sample is taken.  A random blood glucose test. Your blood glucose is checked at any time of the day regardless of when you ate.  A hemoglobin A1c blood glucose test. A hemoglobin A1c test provides information about blood glucose control over the previous 3 months.  An oral glucose tolerance test (OGTT). Your blood glucose is measured after you have not eaten (fasted) for 2 hours and then after you drink a glucose-containing beverage. TREATMENT   You may need to take insulin or diabetes medicine daily to keep blood glucose levels in the desired range.  You will need to match insulin dosing with exercise and healthy food choices. The treatment goal is to maintain the before meal blood sugar (preprandial glucose) level at 70  130 mg/dL. HOME CARE INSTRUCTIONS   Have your hemoglobin A1c level checked twice a year.  Perform daily blood glucose monitoring as directed by your caregiver.  Monitor urine ketones when you are ill and as directed by your caregiver.  Take your diabetes medicine or insulin as directed by your caregiver to maintain your blood glucose levels in the desired  range.  Never run out of diabetes medicine or insulin. It is needed every day.  Adjust insulin based on your intake of carbohydrates. Carbohydrates can raise blood glucose levels but need to be included in your diet. Carbohydrates provide vitamins, minerals, and fiber which are an essential part of a healthy diet. Carbohydrates are found in fruits, vegetables, whole grains, dairy products, legumes, and foods containing added sugars.    Eat healthy foods. Alternate 3 meals with 3 snacks.  Lose weight if overweight.  Carry a medical alert card or wear your medical alert jewelry.  Carry a 15 gram carbohydrate snack with you at all times to treat low blood glucose (hypoglycemia). Some examples of 15 gram carbohydrate snacks include:  Glucose tablets, 3 or 4   Glucose gel, 15 gram tube  Raisins, 2 tablespoons (24 grams)  Jelly beans, 6  Animal crackers, 8  Regular pop, 4 ounces (120 mL)  Gummy treats, 9  Recognize hypoglycemia. Hypoglycemia occurs with blood glucose levels of 70 mg/dL and below. The risk for hypoglycemia increases when fasting or skipping meals, during or after intense exercise, and during sleep. Hypoglycemia symptoms can include:  Tremors or shakes.  Decreased ability to concentrate.  Sweating.  Increased heart rate.  Headache.  Dry mouth.  Hunger.  Irritability.  Anxiety.  Restless sleep.  Altered speech or coordination.  Confusion.  Treat hypoglycemia promptly. If you are alert and able to safely swallow, follow the 15:15 rule:  Take 15 20 grams of rapid-acting glucose or carbohydrate. Rapid-acting options include glucose gel, glucose tablets, or 4 ounces (120 mL) of fruit juice, regular soda, or low fat milk.  Check your blood glucose level 15 minutes after taking the glucose.  Take 15 20 grams more of glucose if the repeat blood glucose level is still 70 mg/dL or below.  Eat a meal or snack within 1 hour once blood glucose levels  return to normal.    Be alert to polyuria and polydipsia which are early signs of hyperglycemia. An early awareness of hyperglycemia allows for prompt treatment. Treat hyperglycemia as directed by your caregiver.  Engage in at least 150 minutes of moderate-intensity physical activity a week, spread over at least 3 days of the week or as directed by your caregiver. In addition, you should engage in resistance exercise at least 2 times a week or as directed by your caregiver.  Adjust your medicine and food intake as needed if you start a new exercise or sport.  Follow your sick day plan at any time you are unable to eat or drink as usual.  Avoid tobacco use.  Limit alcohol intake to no more than 1 drink per day for nonpregnant women and 2 drinks per day for men. You should drink alcohol only when you are also eating food. Talk with your caregiver whether alcohol is safe for you. Tell your caregiver if you drink alcohol several times a week.  Follow up with your caregiver regularly.  Schedule an eye exam soon after the diagnosis of type 2 diabetes and then annually.  Perform daily skin and foot care. Examine your skin and  feet daily for cuts, bruises, redness, nail problems, bleeding, blisters, or sores. A foot exam by a caregiver should be done annually.  Brush your teeth and gums at least twice a day and floss at least once a day. Follow up with your dentist regularly.  Share your diabetes management plan with your workplace or school.  Stay up-to-date with immunizations.  Learn to manage stress.  Obtain ongoing diabetes education and support as needed.  Participate in, or seek rehabilitation as needed to maintain or improve independence and quality of life. Request a physical or occupational therapy referral if you are having foot or hand numbness or difficulties with grooming, dressing, eating, or physical activity. SEEK MEDICAL CARE IF:   You are unable to eat food or drink  fluids for more than 6 hours.  You have nausea and vomiting for more than 6 hours.  Your blood glucose level is over 240 mg/dL.  There is a change in mental status.  You develop an additional serious illness.  You have diarrhea for more than 6 hours.  You have been sick or have had a fever for a couple of days and are not getting better.  You have pain during any physical activity.  SEEK IMMEDIATE MEDICAL CARE IF:  You have difficulty breathing.  You have moderate to large ketone levels. MAKE SURE YOU:  Understand these instructions.  Will watch your condition.  Will get help right away if you are not doing well or get worse. Document Released: 01/13/2005 Document Revised: 10/08/2011 Document Reviewed: 08/12/2011 [facility] Patient Information 2014 [place].

## 2013-04-20 NOTE — Progress Notes (Signed)
Subjective:    Patient ID: Lydia King, female    DOB: Oct 13, 1930, 80 y.o.   MRN: [id number]  HPI Here for f/u of chronic health problems   She is seeing a nutritionist -and that is helping with weight loss  Eating better - less fat and less sweets and less white foods  She can get reimbursed for that  Her nutritionist knows she is a diabetic  Limiting her carbs and portion size  She is also eating a protien powder - mixed with water or almond milk   Doing chair exercise program -moving arms and legs on the days when she can  At least 3 days per week    bp is stable today  No cp or palpitations or headaches or edema  No side effects to medicines  BP Readings from Last 3 Encounters:  04/20/13 126/76  08/31/12 126/68  11/18/11 124/58     Wt is down 2 lb   Last visit -  Lab Results  Component Value Date   HGBA1C 7.0* 08/31/2012   Prev hyperglycemic  Pt did not f/u as asked for that  Is experienced with checking her sugar- done it with family members  No excessive thirst or urination  No new numbness in fingers and toes    hyperlipidemia Lab Results  Component Value Date   CHOL 153 11/13/2011   HDL 50.70 11/13/2011   LDLCALC 89 11/13/2011   LDLDIRECT 237.4 07/14/2008   TRIG 69.0 11/13/2011   CHOLHDL 3 11/13/2011   wants to re check today to see if diet helped also    Chemistry      Component Value Date/Time   NA 141 08/31/2012 0905   K 4.4 08/31/2012 0905   CL 109 08/31/2012 0905   CO2 26 08/31/2012 0905   BUN 23 08/31/2012 0905   CREATININE 0.8 08/31/2012 0905      Component Value Date/Time   CALCIUM 9.8 08/31/2012 0905   ALKPHOS 60 08/31/2012 0905   AST 17 08/31/2012 0905   ALT 25 08/31/2012 0905   BILITOT 0.8 08/31/2012 0905       Patient Active Problem List   Diagnosis Date Noted  . Hyperglycemia 08/31/2012  . CVA 09/12/2008  . OSTEOARTHRITIS, MULTIPLE JOINTS 05/30/2008  . OSTEOPENIA 05/30/2008  . UNSPECIFIED VITAMIN D DEFICIENCY 07/14/2007  . SLEEP APNEA  07/14/2007  . HYPERLIPIDEMIA 05/26/2007  . HYPERTENSION 11/30/2006  . ASTHMA 11/30/2006  . H/O gastroesophageal reflux (GERD) 11/30/2006  . G I BLEED 11/30/2006  . DEEP VENOUS THROMBOPHLEBITIS, HX OF 11/30/2006   Past Medical History  Diagnosis Date  . Anemia     in past (from blood loss at hiatal hernia)  . Hiatal hernia   . Charcot-Marie-Tooth disease     walks with cane (fairly controlled)  . OA (ocular albinism)   . Asthma   . Diverticulosis   . Headache(784.0)     migraine  . GERD (gastroesophageal reflux disease)   . PUD (peptic ulcer disease)     gastric, past  . Hypertension   . Hyperlipidemia   . DVT (deep venous thrombosis) 2003    after sx  . Urine incontinence   . Grief reaction     on fluoxetine  . Osteopenia   . Shingles   . Stroke 8/10    posterior reversible encephalopathy syndrome   Past Surgical History  Procedure Laterality Date  . Hiatal hernia repair  2000    needed blood transfusion  . Total  knee arthroplasty  03,06    Bilateral  . Vesicovaginal fistula closure w/ tah  1964    with bleeding and cervical dysplasia  . Hernia repair  2002    bleeding  . Colonoscopy  2002  . Stroke  8/10    hosp stroke/ posterior (vs posterior reversible encephalopathy syndrome) - d/c on Plavix  . Tee normal  1/61    no emoblic source  . Carotid dopplers  8/10   History  Substance Use Topics  . Smoking status: Never Smoker   . Smokeless tobacco: Never Used  . Alcohol Use: No   Family History  Problem Relation Age of Onset  . Cerebral aneurysm Son   . Cerebral aneurysm Father   . Stroke Mother   . Cervical cancer Sister   . Hyperlipidemia Father   . Alcohol abuse Father   . Hyperlipidemia Brother   . Alcohol abuse Brother   . Stroke Brother   . Diabetes Brother   . Diabetes Sister   . Sarcoidosis Sister   . Other Sister     OP/ Broken hip  . Other      whole family has charcot marie tooth  . Ovarian cancer Sister    Allergies  Allergen  Reactions  . Amlodipine Besylate     REACTION: edema  . Atorvastatin     REACTION: pain- could not walk  . Ciprofloxacin     REACTION: unkown  . Codeine     REACTION: itchy rash  . Nitrofurantoin   . Omeprazole     REACTION: not effective  . Pantoprazole Sodium     REACTION: does not work well  . Penicillins     REACTION: itchy rash  . Prednisone     REACTION: RASH.// SHORTNESS OF BREATH  . Simvastatin     REACTION: leg pain  . Sulfonamide Derivatives     REACTION: itchy rash   Current Outpatient Prescriptions on File Prior to Visit  Medication Sig Dispense Refill  . acetaminophen (TYLENOL) 500 MG tablet Take 500 mg by mouth every 6 (six) hours as needed.        . Cholecalciferol (VITAMIN D) 2000 UNITS CAPS Take 2 capsules by mouth daily.        . clopidogrel (PLAVIX) 75 MG tablet TAKE 1 TABLET BY MOUTH ONCE A DAY  90 tablet  1  . CRESTOR 10 MG tablet TAKE 1 TABLET BY MOUTH AT BEDTIME  90 tablet  1  . Flaxseed, Linseed, (FLAX SEED OIL) 1000 MG CAPS Take 1 capsule by mouth daily.        Marland Kitchen FLUoxetine (PROZAC) 20 MG capsule Take 1 capsule (20 mg total) by mouth daily.  90 capsule  1  . losartan (COZAAR) 100 MG tablet TAKE 1/2 TAB BY MOUTH DAILY  45 tablet  1  . Misc. Devices (WALKER) MISC Wheeled walker with seat- Pt must use for ambulation-- for use as directed: ambulation dx: 729.5, 780.79, 786.05, 784.0, 715.89.       Marland Kitchen NEXIUM 40 MG capsule TAKE ONE CAPSULE BY MOUTH EVERY MORNING BEFORE BREAKFAST  90 capsule  1  . omega-3 acid ethyl esters (LOVAZA) 1 G capsule TAKE 1 CAPSULE (1 G TOTAL) BY MOUTH DAILY.  90 capsule  1   No current facility-administered medications on file prior to visit.    Review of Systems Review of Systems  Constitutional: Negative for fever, appetite change, fatigue and unexpected weight change.  Eyes: Negative for pain and visual disturbance.  Respiratory: Negative for cough and shortness of breath.   Cardiovascular: Negative for cp or palpitations      Gastrointestinal: Negative for nausea, diarrhea and constipation.  Genitourinary: Negative for urgency and frequency.  Skin: Negative for pallor or rash   MSK pos for chronic arthritis pain/ back pain  Neurological: Negative for weakness, light-headedness, numbness and headaches.  Hematological: Negative for adenopathy. Does not bruise/bleed easily.  Psychiatric/Behavioral: Negative for dysphoric mood. The patient is not nervous/anxious.         Objective:   Physical Exam  Constitutional: She appears well-developed and well-nourished. No distress.  obese and well appearing   HENT:  Head: Normocephalic and atraumatic.  Mouth/Throat: Oropharynx is clear and moist.  Eyes: Conjunctivae and EOM are normal. Pupils are equal, round, and reactive to light. No scleral icterus.  Neck: Normal range of motion. Neck supple. No JVD present. Carotid bruit is not present. No thyromegaly present.  Cardiovascular: Normal rate, regular rhythm and intact distal pulses.  Exam reveals no gallop.   Pulmonary/Chest: Effort normal and breath sounds normal. No respiratory distress. She has no wheezes. She has no rales.  Abdominal: Soft. Bowel sounds are normal. She exhibits no distension, no abdominal bruit and no mass. There is no tenderness.  Musculoskeletal: She exhibits no edema.  Limited rom spine and knees   Lymphadenopathy:    She has no cervical adenopathy.  Neurological: She is alert. She has normal reflexes. No cranial nerve deficit. She exhibits normal muscle tone. Coordination normal.  Skin: Skin is warm and dry. No rash noted. No erythema. No pallor.  Psychiatric: She has a normal mood and affect.          Assessment & Plan:

## 2013-04-21 ENCOUNTER — Encounter: Payer: Self-pay | Admitting: Family Medicine

## 2013-04-21 LAB — LIPID PANEL
Cholesterol: 186 mg/dL (ref 0–200)
HDL: 53.1 mg/dL (ref >39.00)
LDL Cholesterol: 104 mg/dL — ABNORMAL HIGH (ref 0–99)
Total CHOL/HDL Ratio: 4
Triglycerides: 146 mg/dL (ref 0.0–149.0)
VLDL: 29.2 mg/dL (ref 0.0–40.0)

## 2013-04-21 LAB — HEMOGLOBIN A1C: HEMOGLOBIN A1C: 7.2 % — AB (ref 4.6–6.5)

## 2013-04-21 NOTE — Assessment & Plan Note (Signed)
bp in fair control at this time  BP Readings from Last 1 Encounters:  04/20/13 126/76   No changes needed Disc lifstyle change with low sodium diet and exercise ]

## 2013-04-21 NOTE — Assessment & Plan Note (Signed)
Last A1C was 7 - in DM range and pt was lost to follow up  Long disc re: DM today and pt hopes that her new diet effort has taken her out of that range  She is seeing nutritionist and getting more protein in diet  A1C today- if this is in DM range again would consider DM ed and testing equipt and poss metformin  Given handouts on DM

## 2013-04-21 NOTE — Assessment & Plan Note (Signed)
Lipid check today crestor and diet  Disc goals for lipids and reasons to control them Rev labs with pt from last time  Rev low sat fat diet in detail

## 2013-04-22 ENCOUNTER — Telehealth: Payer: Self-pay | Admitting: Family Medicine

## 2013-04-22 ENCOUNTER — Encounter: Payer: Self-pay | Admitting: Family Medicine

## 2013-04-22 MED ORDER — METFORMIN HCL 500 MG PO TABS: 500.0000 mg | ORAL_TABLET | Freq: Two times a day (BID) | ORAL | Status: DC

## 2013-04-22 NOTE — Telephone Encounter (Signed)
New metformin px

## 2013-04-22 NOTE — Telephone Encounter (Signed)
Diabetes test equipt- I wrote a px by hand-will [facility]? thanks

## 2013-04-25 ENCOUNTER — Telehealth: Payer: Self-pay

## 2013-04-25 DIAGNOSIS — E119 Type 2 diabetes mellitus without complications: Secondary | ICD-10-CM

## 2013-04-25 NOTE — Telephone Encounter (Signed)
Megan at [facility] left v/m requesting referral order for diabetic teaching faxed to [facility] fax [phone].Please advise.

## 2013-04-25 NOTE — Telephone Encounter (Signed)
Rx faxed

## 2013-04-27 ENCOUNTER — Other Ambulatory Visit: Payer: Self-pay | Admitting: *Deleted

## 2013-04-27 ENCOUNTER — Encounter: Payer: Self-pay | Admitting: Family Medicine

## 2013-04-27 MED ORDER — CLOPIDOGREL BISULFATE 75 MG PO TABS: ORAL_TABLET | ORAL | Status: DC

## 2013-04-28 NOTE — Telephone Encounter (Signed)
Referral faxed to [facility]

## 2013-05-02 ENCOUNTER — Encounter: Payer: Self-pay | Admitting: Internal Medicine

## 2013-05-02 ENCOUNTER — Ambulatory Visit (INDEPENDENT_AMBULATORY_CARE_PROVIDER_SITE_OTHER): Payer: Medicare Other | Admitting: Internal Medicine

## 2013-05-02 VITALS — BP 120/76 | HR 73 | Temp 98.2°F | Wt 191.2 lb

## 2013-05-02 DIAGNOSIS — J309 Allergic rhinitis, unspecified: Secondary | ICD-10-CM | POA: Diagnosis not present

## 2013-05-02 MED ORDER — METHYLPREDNISOLONE ACETATE 80 MG/ML IJ SUSP
80.0000 mg | Freq: Once | INTRAMUSCULAR | Status: AC
  Administered 2013-05-02: 80 mg via INTRAMUSCULAR

## 2013-05-02 NOTE — Progress Notes (Addendum)
HPI  Pt presents to the clinic today with c/o headache, eyes burning, facial pain and pressure, runny nose and ear fullness. She reports this started about 5 days ago. She is having clear mucous out of her nose. The cough is unproductive. She does have chest soreness when she coughs. She denies fever, chills or body aches. She has tried tylenol, saline nasal spray, nasocort and sinus rinse without much relief. She does have a history of seasonal allergies.  Review of Systems    Past Medical History  Diagnosis Date  . Anemia     in past (from blood loss at hiatal hernia)  . Hiatal hernia   . Charcot-Marie-Tooth disease     walks with cane (fairly controlled)  . OA (ocular albinism)   . Asthma   . Diverticulosis   . Headache(784.0)     migraine  . GERD (gastroesophageal reflux disease)   . PUD (peptic ulcer disease)     gastric, past  . Hypertension   . Hyperlipidemia   . DVT (deep venous thrombosis) 2003    after sx  . Urine incontinence   . Grief reaction     on fluoxetine  . Osteopenia   . Shingles   . Stroke 8/10    posterior reversible encephalopathy syndrome    Family History  Problem Relation Age of Onset  . Cerebral aneurysm Son   . Cerebral aneurysm Father   . Stroke Mother   . Cervical cancer Sister   . Hyperlipidemia Father   . Alcohol abuse Father   . Hyperlipidemia Brother   . Alcohol abuse Brother   . Stroke Brother   . Diabetes Brother   . Diabetes Sister   . Sarcoidosis Sister   . Other Sister     OP/ Broken hip  . Other      whole family has charcot marie tooth  . Ovarian cancer Sister     History   Social History  . Marital Status: Widowed    Spouse Name: N/A    Number of Children: N/A  . Years of Education: N/A   Occupational History  . Not on file.   Social History Main Topics  . Smoking status: Never Smoker   . Smokeless tobacco: Never Used  . Alcohol Use: No  . Drug Use: No  . Sexual Activity: Not on file   Other Topics  Concern  . Not on file   Social History Narrative   G6P4   Son died 2 years ago cerebral anerysm    Allergies  Allergen Reactions  . Amlodipine Besylate     REACTION: edema  . Atorvastatin     REACTION: pain- could not walk  . Ciprofloxacin     REACTION: unkown  . Codeine     REACTION: itchy rash  . Nitrofurantoin   . Omeprazole     REACTION: not effective  . Pantoprazole Sodium     REACTION: does not work well  . Penicillins     REACTION: itchy rash  . Prednisone     REACTION: RASH.// SHORTNESS OF BREATH  . Simvastatin     REACTION: leg pain  . Sulfonamide Derivatives     REACTION: itchy rash     Constitutional: Positive headache, fatigue. Denies fever or abrupt weight changes.  HEENT:  Positive eye pain, pressure behind the eyes, facial pain, runny nose. Denies eye redness, ringing in the ears, wax buildup, sore throat or bloody nose. Respiratory: Positive cough. Denies difficulty breathing or  shortness of breath.  Cardiovascular: Denies chest pain, chest tightness, palpitations or swelling in the hands or feet.   No other specific complaints in a complete review of systems (except as listed in HPI above).  Objective:  BP 120/76  Pulse 73  Temp(Src) 98.2 F (36.8 C) (Oral)  Wt 191 lb 4 oz (86.75 kg)  SpO2 99%   General: Appears her stated age, well developed, well nourished in NAD. HEENT: Head: normal shape and size, maxillary sinus tenderness noted; Eyes: sclera white, no icterus, conjunctiva pink, PERRLA and EOMs intact; Ears: Tm's gray and intact, normal light reflex; Nose: mucosa pink and moist, septum midline; Throat/Mouth: + PND. Teeth present, mucosa pink and moist, no exudate noted, no lesions or ulcerations noted.  Cardiovascular: Normal rate and rhythm. S1,S2 noted.  No murmur, rubs or gallops noted. No JVD or BLE edema. No carotid bruits noted. Pulmonary/Chest: Normal effort and positive vesicular breath sounds. No respiratory distress. No wheezes,  rales or ronchi noted.      Assessment & Plan:   Allergic Rhinitis  Can use a Neti Pot which can be purchased from your local drug store. Continue Nasocort Claritin 10 mg BID x 1 week then daily thereafter 80 mg Depo IM today Watch for colored mucous, fever, worsening pain  RTC as needed or if symptoms persist.

## 2013-05-02 NOTE — Patient Instructions (Addendum)
Allergic Rhinitis Allergic rhinitis is when the mucous membranes in the nose respond to allergens. Allergens are particles in the air that cause your body to have an allergic reaction. This causes you to release allergic antibodies. Through a chain of events, these eventually cause you to release histamine into the blood stream. Although meant to protect the body, it is this release of histamine that causes your discomfort, such as frequent sneezing, congestion, and an itchy, runny nose.  CAUSES  Seasonal allergic rhinitis (hay fever) is caused by pollen allergens that may come from grasses, trees, and weeds. Year-round allergic rhinitis (perennial allergic rhinitis) is caused by allergens such as house dust mites, pet dander, and mold spores.  SYMPTOMS   Nasal stuffiness (congestion).  Itchy, runny nose with sneezing and tearing of the eyes. DIAGNOSIS  Your health care provider can help you determine the allergen or allergens that trigger your symptoms. If you and your health care provider are unable to determine the allergen, skin or blood testing may be used. TREATMENT  Allergic Rhinitis does not have a cure, but it can be controlled by:  Medicines and allergy shots (immunotherapy).  Avoiding the allergen. Hay fever may often be treated with antihistamines in pill or nasal spray forms. Antihistamines block the effects of histamine. There are over-the-counter medicines that may help with nasal congestion and swelling around the eyes. Check with your health care provider before taking or giving this medicine.  If avoiding the allergen or the medicine prescribed do not work, there are many new medicines your health care provider can prescribe. Stronger medicine may be used if initial measures are ineffective. Desensitizing injections can be used if medicine and avoidance does not work. Desensitization is when a patient is given ongoing shots until the body becomes less sensitive to the allergen.  Make sure you follow up with your health care provider if problems continue. HOME CARE INSTRUCTIONS It is not possible to completely avoid allergens, but you can reduce your symptoms by taking steps to limit your exposure to them. It helps to know exactly what you are allergic to so that you can avoid your specific triggers. SEEK MEDICAL CARE IF:   You have a fever.  You develop a cough that does not stop easily (persistent).  You have shortness of breath.  You start wheezing.  Symptoms interfere with normal daily activities. Document Released: 10/08/2000 Document Revised: 11/03/2012 Document Reviewed: 09/20/2012 Exit[employer] Patient Information 2014 [employer].  

## 2013-05-02 NOTE — Addendum Note (Signed)
Addended by: Lurlean Nanny on: 05/02/2013 03:56 PM   Modules accepted: Orders

## 2013-05-04 DIAGNOSIS — E119 Type 2 diabetes mellitus without complications: Secondary | ICD-10-CM | POA: Diagnosis not present

## 2013-05-04 NOTE — Telephone Encounter (Signed)
Spoke with pharmacy and meter and supplies were sent to pharmacy

## 2013-05-19 DIAGNOSIS — E1139 Type 2 diabetes mellitus with other diabetic ophthalmic complication: Secondary | ICD-10-CM | POA: Diagnosis not present

## 2013-05-19 DIAGNOSIS — H543 Unqualified visual loss, both eyes: Secondary | ICD-10-CM | POA: Diagnosis not present

## 2013-05-19 DIAGNOSIS — H5210 Myopia, unspecified eye: Secondary | ICD-10-CM | POA: Diagnosis not present

## 2013-05-19 DIAGNOSIS — H43399 Other vitreous opacities, unspecified eye: Secondary | ICD-10-CM | POA: Diagnosis not present

## 2013-05-20 ENCOUNTER — Encounter: Payer: Self-pay | Admitting: Family Medicine

## 2013-05-22 ENCOUNTER — Encounter: Payer: Self-pay | Admitting: Family Medicine

## 2013-05-23 ENCOUNTER — Telehealth: Payer: Self-pay

## 2013-05-23 NOTE — Telephone Encounter (Signed)
Melanie with [facility] left v/m; Threasa Beards needs verification of test strips and lancets; is not familiar with [facility] test strips; Melanie request cb.

## 2013-05-23 NOTE — Telephone Encounter (Signed)
Rx faxed to pharmacy  

## 2013-05-30 NOTE — Telephone Encounter (Signed)
I have clarified what strips and lancets that pt needs with [facility]

## 2013-06-08 ENCOUNTER — Encounter: Payer: Self-pay | Admitting: Family Medicine

## 2013-07-17 ENCOUNTER — Other Ambulatory Visit: Payer: Self-pay | Admitting: Family Medicine

## 2013-08-01 ENCOUNTER — Other Ambulatory Visit: Payer: Self-pay | Admitting: Family Medicine

## 2013-08-02 NOTE — Telephone Encounter (Signed)
Please refill for a year  

## 2013-08-02 NOTE — Telephone Encounter (Signed)
done

## 2013-08-02 NOTE — Telephone Encounter (Signed)
Electronic refill request, please advise  

## 2013-09-04 ENCOUNTER — Telehealth: Payer: Self-pay | Admitting: Family Medicine

## 2013-09-04 DIAGNOSIS — E559 Vitamin D deficiency, unspecified: Secondary | ICD-10-CM

## 2013-09-04 DIAGNOSIS — M949 Disorder of cartilage, unspecified: Secondary | ICD-10-CM

## 2013-09-04 DIAGNOSIS — E119 Type 2 diabetes mellitus without complications: Secondary | ICD-10-CM

## 2013-09-04 DIAGNOSIS — I10 Essential (primary) hypertension: Secondary | ICD-10-CM

## 2013-09-04 DIAGNOSIS — M899 Disorder of bone, unspecified: Secondary | ICD-10-CM

## 2013-09-04 DIAGNOSIS — E785 Hyperlipidemia, unspecified: Secondary | ICD-10-CM

## 2013-09-04 NOTE — Telephone Encounter (Signed)
Message copied by Abner Greenspan on Sun Sep 04, 2013 12:37 PM ------      Message from: Ellamae Sia      Created: Mon Aug 29, 2013 10:40 AM      Regarding: Lab orders for Monday, 8.10.15       Patient is scheduled for CPX labs, please order future labs, Thanks , Terri       ------

## 2013-09-05 ENCOUNTER — Other Ambulatory Visit (INDEPENDENT_AMBULATORY_CARE_PROVIDER_SITE_OTHER): Payer: Medicare Other

## 2013-09-05 DIAGNOSIS — E1165 Type 2 diabetes mellitus with hyperglycemia: Secondary | ICD-10-CM

## 2013-09-05 DIAGNOSIS — M899 Disorder of bone, unspecified: Secondary | ICD-10-CM

## 2013-09-05 DIAGNOSIS — E785 Hyperlipidemia, unspecified: Secondary | ICD-10-CM | POA: Diagnosis not present

## 2013-09-05 DIAGNOSIS — E119 Type 2 diabetes mellitus without complications: Secondary | ICD-10-CM | POA: Diagnosis not present

## 2013-09-05 DIAGNOSIS — E559 Vitamin D deficiency, unspecified: Secondary | ICD-10-CM

## 2013-09-05 DIAGNOSIS — M949 Disorder of cartilage, unspecified: Secondary | ICD-10-CM

## 2013-09-05 DIAGNOSIS — I10 Essential (primary) hypertension: Secondary | ICD-10-CM | POA: Diagnosis not present

## 2013-09-05 LAB — CBC WITH DIFFERENTIAL/PLATELET
BASOS ABS: 0 10*3/uL (ref 0.0–0.1)
Basophils Relative: 0.6 % (ref 0.0–3.0)
Eosinophils Absolute: 0.1 10*3/uL (ref 0.0–0.7)
Eosinophils Relative: 1.5 % (ref 0.0–5.0)
HCT: 39.1 % (ref 36.0–46.0)
HEMOGLOBIN: 13 g/dL (ref 12.0–15.0)
Lymphocytes Relative: 29.8 % (ref 12.0–46.0)
Lymphs Abs: 1.6 10*3/uL (ref 0.7–4.0)
MCHC: 33.3 g/dL (ref 30.0–36.0)
MCV: 93.1 fl (ref 78.0–100.0)
Monocytes Absolute: 0.3 10*3/uL (ref 0.1–1.0)
Monocytes Relative: 5.7 % (ref 3.0–12.0)
NEUTROS ABS: 3.3 10*3/uL (ref 1.4–7.7)
Neutrophils Relative %: 62.4 % (ref 43.0–77.0)
Platelets: 183 10*3/uL (ref 150.0–400.0)
RBC: 4.2 Mil/uL (ref 3.87–5.11)
RDW: 13.6 % (ref 11.5–15.5)
WBC: 5.2 10*3/uL (ref 4.0–10.5)

## 2013-09-05 LAB — LIPID PANEL
CHOL/HDL RATIO: 3
Cholesterol: 181 mg/dL (ref 0–200)
HDL: 55.3 mg/dL (ref >39.00)
LDL Cholesterol: 108 mg/dL — ABNORMAL HIGH (ref 0–99)
NonHDL: 125.7
TRIGLYCERIDES: 90 mg/dL (ref 0.0–149.0)
VLDL: 18 mg/dL (ref 0.0–40.0)

## 2013-09-05 LAB — COMPREHENSIVE METABOLIC PANEL
ALT: 19 U/L (ref 0–35)
AST: 15 U/L (ref 0–37)
Albumin: 3.6 g/dL (ref 3.5–5.2)
Alkaline Phosphatase: 53 U/L (ref 39–117)
BUN: 17 mg/dL (ref 6–23)
CO2: 27 meq/L (ref 19–32)
CREATININE: 0.7 mg/dL (ref 0.4–1.2)
Calcium: 9.6 mg/dL (ref 8.4–10.5)
Chloride: 103 mEq/L (ref 96–112)
GFR: 91.35 mL/min (ref >60.00)
Glucose, Bld: 96 mg/dL (ref 70–99)
Potassium: 4.6 mEq/L (ref 3.5–5.1)
Sodium: 137 mEq/L (ref 135–145)
Total Bilirubin: 0.6 mg/dL (ref 0.2–1.2)
Total Protein: 6.3 g/dL (ref 6.0–8.3)

## 2013-09-05 LAB — VITAMIN D 25 HYDROXY (VIT D DEFICIENCY, FRACTURES): VITD: 70.49 ng/mL (ref 30.00–100.00)

## 2013-09-05 LAB — TSH: TSH: 1.09 u[IU]/mL (ref 0.35–4.50)

## 2013-09-05 LAB — HEMOGLOBIN A1C: Hgb A1c MFr Bld: 6.2 % (ref 4.6–6.5)

## 2013-09-27 ENCOUNTER — Other Ambulatory Visit: Payer: Self-pay | Admitting: Family Medicine

## 2013-09-28 ENCOUNTER — Other Ambulatory Visit: Payer: Self-pay | Admitting: Family Medicine

## 2013-10-04 ENCOUNTER — Encounter: Payer: Self-pay | Admitting: Family Medicine

## 2013-10-04 ENCOUNTER — Ambulatory Visit (INDEPENDENT_AMBULATORY_CARE_PROVIDER_SITE_OTHER): Payer: Medicare Other | Admitting: Family Medicine

## 2013-10-04 ENCOUNTER — Other Ambulatory Visit: Payer: Self-pay | Admitting: Family Medicine

## 2013-10-04 VITALS — BP 110/72 | HR 71 | Temp 98.6°F | Ht <= 58 in | Wt 182.2 lb

## 2013-10-04 DIAGNOSIS — E119 Type 2 diabetes mellitus without complications: Secondary | ICD-10-CM

## 2013-10-04 DIAGNOSIS — I10 Essential (primary) hypertension: Secondary | ICD-10-CM | POA: Diagnosis not present

## 2013-10-04 DIAGNOSIS — E559 Vitamin D deficiency, unspecified: Secondary | ICD-10-CM

## 2013-10-04 DIAGNOSIS — E785 Hyperlipidemia, unspecified: Secondary | ICD-10-CM | POA: Diagnosis not present

## 2013-10-04 DIAGNOSIS — Z0000 Encounter for general adult medical examination without abnormal findings: Secondary | ICD-10-CM

## 2013-10-04 DIAGNOSIS — M949 Disorder of cartilage, unspecified: Secondary | ICD-10-CM

## 2013-10-04 DIAGNOSIS — M899 Disorder of bone, unspecified: Secondary | ICD-10-CM

## 2013-10-04 DIAGNOSIS — Z23 Encounter for immunization: Secondary | ICD-10-CM

## 2013-10-04 NOTE — Patient Instructions (Signed)
prevnar vaccine today (pneumonia booster) If you are interested in a shingles/zoster vaccine - call your insurance to check on coverage,( you should not get it within 1 month of other vaccines) , then call us for a prescription  for it to take to a pharmacy that gives the shot , or make a nurse visit to get it here depending on your coverage If you are interested in a bone density test - (also known as a dexa)- call your insurance co. Tell them you have osteopenia and are due for a re check -see if covered and then call us to schedule if you want one  Continue good diet and exercise  Follow up in about 6 months with labs prior

## 2013-10-04 NOTE — Progress Notes (Signed)
Subjective:    Patient ID: Lydia King, female    DOB: [birth year], 80 y.o.   MRN: [id number]  HPI I have personally reviewed the Medicare Annual Wellness questionnaire and have noted 1. The patient's medical and social history 2. Their use of alcohol, tobacco or illicit drugs 3. Their current medications and supplements 4. The patient's functional ability including ADL's, fall risks, home safety risks and hearing or visual             impairment. 5. Diet and physical activities 6. Evidence for depression or mood disorders  The patients weight, height, BMI have been recorded in the chart and visual acuity is per eye clinic.  I have made referrals, counseling and provided education to the patient based review of the above and I have provided the pt with a written personalized care plan for preventive services.  Doing great overall  Wt is down 9 lb with bmi of 38 She has changed her eating completely -no junk food and has cut out white foods  Does chair exercise 3 times per week , and walks short distances 3 days per week  Is very motivated   See scanned forms.  Routine anticipatory guidance given to patient.  See health maintenance. Colon cancer screening colonosc 1/02 nl , she does not wish to do colon screen or ifob, and no symptoms  Breast cancer screening - also declines mammogram - she is not interested in screening  Self breast exam- no lumps  Flu vaccine- will wait and get it at the pharmacy Tetanus vaccine 4/09 Pneumovax 1/07 -will get prevnar today  Zoster vaccine - she has had shingles twice   Advance directive- has POA , and also a living will - she desires DNR status  Cognitive function addressed- see scanned forms- and if abnormal then additional documentation follows. Excellent memory   PMH and SH reviewed  Meds, vitals, and allergies reviewed.   ROS: See HPI.  Otherwise negative.     Diabetes Home sugar results -are good , no low blood sugars  Sugar  level will go up if she is in pain / from arthritis  DM diet - excellent  Exercise - better  Symptoms-none  A1C last  Lab Results  Component Value Date   HGBA1C 6.2 09/05/2013  very good control   No problems with medications  Renal protection on arb Last eye exam 4/15  Osteopenia Unsure if she wants to have a dexa - will check with her insurance  D level good at 70.9  She has not had a sleep study in over 5 years She has a cpap -she uses it every night  It works well for her     bp is stable today  No cp or palpitations or headaches or edema  No side effects to medicines  BP Readings from Last 3 Encounters:  10/04/13 110/72  05/02/13 120/76  04/20/13 126/76     Hyperlipidemia On crestor and diet  Lab Results  Component Value Date   CHOL 181 09/05/2013   CHOL 186 04/20/2013   CHOL 153 11/13/2011   Lab Results  Component Value Date   HDL 55.30 09/05/2013   HDL 53.10 04/20/2013   HDL 50.70 11/13/2011   Lab Results  Component Value Date   LDLCALC 108* 09/05/2013   LDLCALC 104* 04/20/2013   LDLCALC 89 11/13/2011   Lab Results  Component Value Date   TRIG 90.0 09/05/2013   TRIG 146.0 04/20/2013   TRIG  69.0 11/13/2011   Lab Results  Component Value Date   CHOLHDL 3 09/05/2013   CHOLHDL 4 04/20/2013   CHOLHDL 3 11/13/2011   Lab Results  Component Value Date   LDLDIRECT 237.4 07/14/2008   LDLDIRECT 237.6 11/01/2007   LDLDIRECT 228.4 08/16/2007     this is very good control for her   Patient Active Problem List   Diagnosis Date Noted  . Encounter for Medicare annual wellness exam 10/04/2013  . DM type 2 (diabetes mellitus, type 2) 08/31/2012  . CVA 09/12/2008  . OSTEOARTHRITIS, MULTIPLE JOINTS 05/30/2008  . OSTEOPENIA 05/30/2008  . UNSPECIFIED VITAMIN D DEFICIENCY 07/14/2007  . SLEEP APNEA 07/14/2007  . HYPERLIPIDEMIA 05/26/2007  . HYPERTENSION 11/30/2006  . ASTHMA 11/30/2006  . H/O gastroesophageal reflux (GERD) 11/30/2006  . G I BLEED 11/30/2006  .  DEEP VENOUS THROMBOPHLEBITIS, HX OF 11/30/2006   Past Medical History  Diagnosis Date  . Anemia     in past (from blood loss at hiatal hernia)  . Hiatal hernia   . Charcot-Marie-Tooth disease     walks with cane (fairly controlled)  . OA (ocular albinism)   . Asthma   . Diverticulosis   . Headache(784.0)     migraine  . GERD (gastroesophageal reflux disease)   . PUD (peptic ulcer disease)     gastric, past  . Hypertension   . Hyperlipidemia   . DVT (deep venous thrombosis) 2003    after sx  . Urine incontinence   . Grief reaction     on fluoxetine  . Osteopenia   . Shingles   . Stroke 8/10    posterior reversible encephalopathy syndrome   Past Surgical History  Procedure Laterality Date  . Hiatal hernia repair  2000    needed blood transfusion  . Total knee arthroplasty  03,06    Bilateral  . Vesicovaginal fistula closure w/ tah  1964    with bleeding and cervical dysplasia  . Hernia repair  2002    bleeding  . Colonoscopy  2002  . Stroke  8/10    hosp stroke/ posterior (vs posterior reversible encephalopathy syndrome) - d/c on Plavix  . Tee normal  0/62    no emoblic source  . Carotid dopplers  8/10   History  Substance Use Topics  . Smoking status: Never Smoker   . Smokeless tobacco: Never Used  . Alcohol Use: No   Family History  Problem Relation Age of Onset  . Cerebral aneurysm Son   . Cerebral aneurysm Father   . Stroke Mother   . Cervical cancer Sister   . Hyperlipidemia Father   . Alcohol abuse Father   . Hyperlipidemia Brother   . Alcohol abuse Brother   . Stroke Brother   . Diabetes Brother   . Diabetes Sister   . Sarcoidosis Sister   . Other Sister     OP/ Broken hip  . Other      whole family has charcot marie tooth  . Ovarian cancer Sister    Allergies  Allergen Reactions  . Amlodipine Besylate     REACTION: edema  . Atorvastatin     REACTION: pain- could not walk  . Ciprofloxacin     REACTION: unkown  . Codeine      REACTION: itchy rash  . Nitrofurantoin   . Omeprazole     REACTION: not effective  . Pantoprazole Sodium     REACTION: does not work well  . Penicillins  REACTION: itchy rash  . Prednisone     REACTION: RASH.// SHORTNESS OF BREATH  . Simvastatin     REACTION: leg pain  . Sulfonamide Derivatives     REACTION: itchy rash   Current Outpatient Prescriptions on File Prior to Visit  Medication Sig Dispense Refill  . acetaminophen (TYLENOL) 500 MG tablet Take 500 mg by mouth every 6 (six) hours as needed.        . Cholecalciferol (VITAMIN D) 2000 UNITS CAPS Take 2 capsules by mouth daily.        . clopidogrel (PLAVIX) 75 MG tablet TAKE 1 TABLET BY MOUTH ONCE A DAY  90 tablet  1  . CRESTOR 10 MG tablet TAKE 1 TABLET BY MOUTH AT BEDTIME  90 tablet  0  . Flaxseed, Linseed, (FLAX SEED OIL) 1000 MG CAPS Take 1 capsule by mouth daily.        Marland Kitchen FLUoxetine (PROZAC) 20 MG capsule TAKE ONE CAPSULE BY MOUTH EVERY DAY  90 capsule  3  . losartan (COZAAR) 100 MG tablet TAKE 1/2 TAB BY MOUTH DAILY  45 tablet  0  . metFORMIN (GLUCOPHAGE) 500 MG tablet Take 1 tablet (500 mg total) by mouth 2 (two) times daily with a meal.  60 tablet  11  . Misc. Devices (WALKER) MISC Wheeled walker with seat- Pt must use for ambulation-- for use as directed: ambulation dx: 729.5, 780.79, 786.05, 784.0, 715.89.       Marland Kitchen NEXIUM 40 MG capsule TAKE ONE CAPSULE BY MOUTH EVERY MORNING BEFORE BREAKFAST  90 capsule  1  . omega-3 acid ethyl esters (LOVAZA) 1 G capsule TAKE ONE CAPSULE BY MOUTH EVERY DAY  90 capsule  0  . spironolactone (ALDACTONE) 50 MG tablet TAKE 1 TABLET BY MOUTH EVERY DAY  90 tablet  0   No current facility-administered medications on file prior to visit.    Review of Systems Review of Systems  Constitutional: Negative for fever, appetite change, fatigue and unexpected weight change.  Eyes: Negative for pain and visual disturbance.  Respiratory: Negative for cough and shortness of breath.   Cardiovascular:  Negative for cp or palpitations    Gastrointestinal: Negative for nausea, diarrhea and constipation.  Genitourinary: Negative for urgency and frequency.  Skin: Negative for pallor or rash MSK pos for arthritis aches and pains    Neurological: Negative for weakness, light-headedness, numbness and headaches.  Hematological: Negative for adenopathy. Does not bruise/bleed easily.  Psychiatric/Behavioral: Negative for dysphoric mood. The patient is not nervous/anxious.         Objective:   Physical Exam  Constitutional: She appears well-developed and well-nourished. No distress.  obese and well appearing   HENT:  Head: Normocephalic and atraumatic.  Right Ear: External ear normal.  Left Ear: External ear normal.  Nose: Nose normal.  Mouth/Throat: Oropharynx is clear and moist.  Eyes: Conjunctivae and EOM are normal. Pupils are equal, round, and reactive to light. Right eye exhibits no discharge. Left eye exhibits no discharge. No scleral icterus.  Neck: Normal range of motion. Neck supple. No JVD present. No thyromegaly present.  Cardiovascular: Normal rate, regular rhythm, normal heart sounds and intact distal pulses.  Exam reveals no gallop.   Pulmonary/Chest: Effort normal and breath sounds normal. No respiratory distress. She has no wheezes. She has no rales.  Abdominal: Soft. Bowel sounds are normal. She exhibits no distension and no mass. There is no tenderness.  Musculoskeletal: She exhibits tenderness. She exhibits no edema.  Joint deformity due  to OA and charcot-marie tooth as well    Lymphadenopathy:    She has no cervical adenopathy.  Neurological: She is alert. She has normal reflexes. No cranial nerve deficit. She exhibits normal muscle tone. Coordination normal.  Skin: Skin is warm and dry. No rash noted. No erythema. No pallor.  Psychiatric: She has a normal mood and affect.          Assessment & Plan:

## 2013-10-04 NOTE — Progress Notes (Signed)
Pre visit review using our clinic review tool, if applicable. No additional management support is needed unless otherwise documented below in the visit note. 

## 2013-10-05 NOTE — Assessment & Plan Note (Signed)
Pt wants to check on ins cov for dexa before we schedule it  Disc need for calcium/ vitamin D/ wt bearing exercise and bone density test every 2 y to monitor Disc safety/ fracture risk in detail   D level is ok

## 2013-10-05 NOTE — Assessment & Plan Note (Signed)
Lab Results  Component Value Date   HGBA1C 6.2 09/05/2013   Very well controlled -improved with lifestyle change

## 2013-10-05 NOTE — Assessment & Plan Note (Signed)
Vitamin D level is therapeutic with current supplementation Disc importance of this to bone and overall health  

## 2013-10-05 NOTE — Assessment & Plan Note (Signed)
Reviewed health habits including diet and exercise and skin cancer prevention Reviewed appropriate screening tests for age  Also reviewed health mt list, fam hx and immunization status , as well as social and family history   Labs reviewed  See HPI  

## 2013-10-05 NOTE — Assessment & Plan Note (Signed)
bp in fair control at this time  BP Readings from Last 1 Encounters:  10/04/13 110/72   No changes needed Disc lifstyle change with low sodium diet and exercise  Labs reviewed

## 2013-10-05 NOTE — Assessment & Plan Note (Signed)
Disc goals for lipids and reasons to control them Rev labs with pt Rev low sat fat diet in detail  Improved with better lifestyle  Continues crestor and lovaza

## 2013-10-06 ENCOUNTER — Telehealth: Payer: Self-pay | Admitting: Family Medicine

## 2013-10-06 NOTE — Telephone Encounter (Signed)
Pt received pneumonia booster on Tuesday 10/04/13. Pt now has big red place around injection site and really sore. Please advise.

## 2013-10-06 NOTE — Telephone Encounter (Signed)
Use a warm or a cool compress on it (whichever feels best) and let us know if fever or any other symptoms.  If this worsens please have her come in to have it looked at.  Thanks

## 2013-10-06 NOTE — Telephone Encounter (Signed)
Pt notified of Dr. Tower's instructions and verbalized understanding  

## 2013-10-12 ENCOUNTER — Other Ambulatory Visit: Payer: Self-pay | Admitting: Family Medicine

## 2013-10-22 ENCOUNTER — Other Ambulatory Visit: Payer: Self-pay | Admitting: Family Medicine

## 2013-11-11 ENCOUNTER — Other Ambulatory Visit: Payer: Self-pay

## 2013-12-28 ENCOUNTER — Other Ambulatory Visit: Payer: Self-pay | Admitting: Family Medicine

## 2014-01-07 ENCOUNTER — Other Ambulatory Visit: Payer: Self-pay | Admitting: Family Medicine

## 2014-01-21 ENCOUNTER — Other Ambulatory Visit: Payer: Self-pay | Admitting: Family Medicine

## 2014-04-08 ENCOUNTER — Other Ambulatory Visit: Payer: Self-pay | Admitting: Family Medicine

## 2014-04-12 ENCOUNTER — Other Ambulatory Visit: Payer: Self-pay | Admitting: Family Medicine

## 2014-04-17 ENCOUNTER — Other Ambulatory Visit: Payer: Self-pay | Admitting: Family Medicine

## 2014-04-28 ENCOUNTER — Other Ambulatory Visit: Payer: Self-pay | Admitting: Family Medicine

## 2014-06-27 ENCOUNTER — Other Ambulatory Visit: Payer: Self-pay | Admitting: Family Medicine

## 2014-06-27 NOTE — Telephone Encounter (Signed)
Please schedule f/u and refill until then Thanks  

## 2014-06-27 NOTE — Telephone Encounter (Signed)
Last office visit 10/01/2013.  [facility] states to follow up in 6 months.  No future appointments scheduled.  Ok to refill?

## 2014-06-27 NOTE — Telephone Encounter (Signed)
appt scheduled and med refilled 

## 2014-07-10 ENCOUNTER — Other Ambulatory Visit: Payer: Self-pay | Admitting: Family Medicine

## 2014-07-10 NOTE — Telephone Encounter (Signed)
Refill sent in as instructed.

## 2014-07-10 NOTE — Telephone Encounter (Signed)
Please refill times one  

## 2014-07-10 NOTE — Telephone Encounter (Signed)
Received refill request electronically from pharmacy. See allergy/contraindiction. Is it okay to refill medication?

## 2014-07-10 NOTE — Telephone Encounter (Signed)
Please refill until visit

## 2014-07-10 NOTE — Telephone Encounter (Signed)
Received refill request electronically from pharmacy. Appointment scheduled 07/12/14 See allergy/contraindication Is it okay to refill medication?

## 2014-07-12 ENCOUNTER — Ambulatory Visit (INDEPENDENT_AMBULATORY_CARE_PROVIDER_SITE_OTHER): Payer: Medicare Other | Admitting: Family Medicine

## 2014-07-12 ENCOUNTER — Encounter: Payer: Self-pay | Admitting: Family Medicine

## 2014-07-12 VITALS — BP 120/68 | HR 76 | Temp 98.3°F | Ht <= 58 in | Wt 174.0 lb

## 2014-07-12 DIAGNOSIS — J4520 Mild intermittent asthma, uncomplicated: Secondary | ICD-10-CM | POA: Diagnosis not present

## 2014-07-12 DIAGNOSIS — I10 Essential (primary) hypertension: Secondary | ICD-10-CM | POA: Diagnosis not present

## 2014-07-12 DIAGNOSIS — E119 Type 2 diabetes mellitus without complications: Secondary | ICD-10-CM | POA: Diagnosis not present

## 2014-07-12 MED ORDER — ALBUTEROL SULFATE HFA 108 (90 BASE) MCG/ACT IN AERS: 2.0000 | INHALATION_SPRAY | RESPIRATORY_TRACT | Status: DC | PRN

## 2014-07-12 NOTE — Progress Notes (Signed)
Subjective:    Patient ID: Lydia King, female    DOB: 1930/03/03, 81 y.o.   MRN: [id number]  HPI Here for f/u of chronic health problems   Wt is down 8 lb with bmi of 36 (obese) Has lost significant wt since about 2011 Thinks our scales are wrong  (182 at home)  She is trying to loose weight - but clothes do not fit differently    bp is stable today  No cp or palpitations or headaches or edema  No side effects to medicines  BP Readings from Last 3 Encounters:  07/12/14 120/68  10/04/13 110/72  05/02/13 120/76      Diabetes Home sugar results -no hypoglycemia, and glucose is not over 140 generally / usually 100-130s  DM diet - eating some in season fruit (not a lot) , on pc chocolate one time  She is cutting way back on bread and more vegetables For protein - chooses chicken and nuts and occ protein drink  Exercise  Symptoms A1C last  Lab Results  Component Value Date   HGBA1C 6.2 09/05/2013  thinks this will be stable  No problems with medications -doing well  Renal protection ARB Last eye exam -due for an exam / missed her last appt    Last lipids Lab Results  Component Value Date   CHOL 181 09/05/2013   HDL 55.30 09/05/2013   LDLCALC 108* 09/05/2013   LDLDIRECT 237.4 07/14/2008   TRIG 90.0 09/05/2013   CHOLHDL 3 09/05/2013    Lives with cats she is allergic to  Needs an inhaler for wheezing/ occ coughing - if she holds or pets a cat   Review of Systems    Review of Systems  Constitutional: Negative for fever, appetite change, fatigue and unexpected weight change.  Eyes: Negative for pain and visual disturbance.  Respiratory: Negative for cough and shortness of breath.  pos for occ mild wheezing  Cardiovascular: Negative for cp or palpitations    Gastrointestinal: Negative for nausea, diarrhea and constipation.  Genitourinary: Negative for urgency and frequency.  Skin: Negative for pallor or rash   Neurological: Negative for weakness,  light-headedness, numbness and headaches.  Hematological: Negative for adenopathy. Does not bruise/bleed easily.  Psychiatric/Behavioral: Negative for dysphoric mood. The patient is not nervous/anxious.      Objective:   Physical Exam  Constitutional: She appears well-developed and well-nourished. No distress.  Frail appearing obese elderly female   HENT:  Head: Normocephalic and atraumatic.  Mouth/Throat: Oropharynx is clear and moist.  Eyes: Conjunctivae and EOM are normal. Pupils are equal, round, and reactive to light.  Neck: Normal range of motion. Neck supple. No JVD present. Carotid bruit is not present. No thyromegaly present.  Cardiovascular: Normal rate, regular rhythm, normal heart sounds and intact distal pulses.  Exam reveals no gallop.   Pulmonary/Chest: Effort normal and breath sounds normal. No respiratory distress. She has no wheezes. She has no rales.  No crackles  Abdominal: Soft. Bowel sounds are normal. She exhibits no distension, no abdominal bruit and no mass. There is no tenderness.  Musculoskeletal: She exhibits no edema.  Lymphadenopathy:    She has no cervical adenopathy.  Neurological: She is alert. She has normal reflexes.  Skin: Skin is warm and dry. No rash noted.  Psychiatric: She has a normal mood and affect.          Assessment & Plan:   Problem List Items Addressed This Visit    Asthma  occ mild flares Worse when around cats  Refilled her albuterol MDI for prn use  Will update if worse       Relevant Medications   albuterol (PROVENTIL HFA;VENTOLIN HFA) 108 (90 BASE) MCG/ACT inhaler   DM type 2 (diabetes mellitus, type 2)    A1C today  Has been well controlled with metformin and diet  inst to make her annual eye exam appt       Relevant Orders   Hemoglobin A1c   Essential hypertension - Primary    bp in fair control at this time  BP Readings from Last 1 Encounters:  07/12/14 120/68   No changes needed Disc lifstyle change  with low sodium diet and exercise  Labs today

## 2014-07-12 NOTE — Patient Instructions (Signed)
Use albuterol for wheezing as needed /please keep your distance from cats Lab for A1C today  Take care of yourself  Follow up in 6 months for annual exam with labs prior

## 2014-07-12 NOTE — Progress Notes (Signed)
Pre visit review using our clinic review tool, if applicable. No additional management support is needed unless otherwise documented below in the visit note. 

## 2014-07-13 LAB — HEMOGLOBIN A1C: HEMOGLOBIN A1C: 6.6 % — AB (ref 4.6–6.5)

## 2014-07-13 NOTE — Assessment & Plan Note (Signed)
bp in fair control at this time  BP Readings from Last 1 Encounters:  07/12/14 120/68   No changes needed Disc lifstyle change with low sodium diet and exercise  Labs today

## 2014-07-13 NOTE — Assessment & Plan Note (Signed)
A1C today  Has been well controlled with metformin and diet  inst to make her annual eye exam appt

## 2014-07-13 NOTE — Assessment & Plan Note (Signed)
occ mild flares Worse when around cats  Refilled her albuterol MDI for prn use  Will update if worse

## 2014-07-16 ENCOUNTER — Other Ambulatory Visit: Payer: Self-pay | Admitting: Family Medicine

## 2014-07-31 ENCOUNTER — Other Ambulatory Visit: Payer: Self-pay | Admitting: Family Medicine

## 2014-08-01 NOTE — Telephone Encounter (Signed)
Pt had f/u appt on 07/12/14, last refilled on 08/02/13 #90 with 3 additional refills, please advise

## 2014-08-01 NOTE — Telephone Encounter (Signed)
done

## 2014-08-01 NOTE — Telephone Encounter (Signed)
Please refill for a year  

## 2014-09-26 ENCOUNTER — Other Ambulatory Visit: Payer: Self-pay | Admitting: Family Medicine

## 2014-10-15 ENCOUNTER — Other Ambulatory Visit: Payer: Self-pay | Admitting: Family Medicine

## 2014-10-26 ENCOUNTER — Ambulatory Visit (INDEPENDENT_AMBULATORY_CARE_PROVIDER_SITE_OTHER): Payer: Medicare Other | Admitting: Internal Medicine

## 2014-10-26 ENCOUNTER — Encounter: Payer: Self-pay | Admitting: Internal Medicine

## 2014-10-26 VITALS — BP 122/72 | HR 76 | Temp 97.7°F | Wt 186.0 lb

## 2014-10-26 DIAGNOSIS — L03031 Cellulitis of right toe: Secondary | ICD-10-CM | POA: Diagnosis not present

## 2014-10-26 DIAGNOSIS — L600 Ingrowing nail: Secondary | ICD-10-CM | POA: Diagnosis not present

## 2014-10-26 NOTE — Progress Notes (Signed)
Subjective:    Patient ID: Lydia King, female    DOB: 08/01/1930, 81 y.o.   MRN: [id number]  HPI  Pt presents to the clinic today with c/o left great toe pain. This started 4-5 days ago. She reports she went to get  A pedicure done and she had the manicurist cut out an ingrown toenail. Since that time, she has noticed increased, swelling, redness and pain. She has noticed some pus draining from her toe. She denies fever or chills. She has cleaned it with Peroxide and Neosporin. She has also soaked her foot in Epsom salt. She does have a history of DM 2 without neuropathy.  Review of Systems      Past Medical History  Diagnosis Date  . Anemia     in past (from blood loss at hiatal hernia)  . Hiatal hernia   . Charcot-Marie-Tooth disease     walks with cane (fairly controlled)  . OA (ocular albinism)   . Asthma   . Diverticulosis   . Headache(784.0)     migraine  . GERD (gastroesophageal reflux disease)   . PUD (peptic ulcer disease)     gastric, past  . Hypertension   . Hyperlipidemia   . DVT (deep venous thrombosis) 2003    after sx  . Urine incontinence   . Grief reaction     on fluoxetine  . Osteopenia   . Shingles   . Stroke 8/10    posterior reversible encephalopathy syndrome    Current Outpatient Prescriptions  Medication Sig Dispense Refill  . acetaminophen (TYLENOL) 500 MG tablet Take 500 mg by mouth every 6 (six) hours as needed.      Marland Kitchen albuterol (PROVENTIL HFA;VENTOLIN HFA) 108 (90 BASE) MCG/ACT inhaler Inhale 2 puffs into the lungs every 4 (four) hours as needed for wheezing or shortness of breath. 1 Inhaler 11  . Cholecalciferol (VITAMIN D) 2000 UNITS CAPS Take 2 capsules by mouth daily.      . clopidogrel (PLAVIX) 75 MG tablet TAKE 1 TABLET BY MOUTH ONCE A DAY 90 tablet 3  . CRESTOR 10 MG tablet TAKE 1 TABLET BY MOUTH AT BEDTIME 90 tablet 2  . Flaxseed, Linseed, (FLAX SEED OIL) 1000 MG CAPS Take 1 capsule by mouth daily.      Marland Kitchen FLUoxetine (PROZAC)  20 MG capsule TAKE ONE CAPSULE BY MOUTH EVERY DAY 90 capsule 3  . losartan (COZAAR) 100 MG tablet TAKE 1/2 TAB BY MOUTH ONCE A DAY 45 tablet 12  . metFORMIN (GLUCOPHAGE) 500 MG tablet TAKE 1 TABLET BY MOUTH TWICE A DAY WITH MEALS 180 tablet 3  . Misc. Devices (WALKER) MISC Wheeled walker with seat- Pt must use for ambulation-- for use as directed: ambulation dx: 729.5, 780.79, 786.05, 784.0, 715.89.     Marland Kitchen NEXIUM 40 MG capsule TAKE ONE CAPSULE BY MOUTH EVERY MORNING BEFORE BREAKFAST 90 capsule 3  . omega-3 acid ethyl esters (LOVAZA) 1 G capsule TAKE ONE CAPSULE BY MOUTH EVERY DAY 90 capsule 1  . spironolactone (ALDACTONE) 50 MG tablet TAKE 1 TABLET BY MOUTH EVERY DAY 90 tablet 1   No current facility-administered medications for this visit.    Allergies  Allergen Reactions  . Amlodipine Besylate     REACTION: edema  . Atorvastatin     REACTION: pain- could not walk  . Ciprofloxacin     REACTION: unkown  . Codeine     REACTION: itchy rash  . Nitrofurantoin   . Omeprazole  REACTION: not effective  . Pantoprazole Sodium     REACTION: does not work well  . Penicillins     REACTION: itchy rash  . Prednisone     REACTION: RASH.// SHORTNESS OF BREATH  . Simvastatin     REACTION: leg pain  . Sulfonamide Derivatives     REACTION: itchy rash    Family History  Problem Relation Age of Onset  . Cerebral aneurysm Son   . Cerebral aneurysm Father   . Stroke Mother   . Cervical cancer Sister   . Hyperlipidemia Father   . Alcohol abuse Father   . Hyperlipidemia Brother   . Alcohol abuse Brother   . Stroke Brother   . Diabetes Brother   . Diabetes Sister   . Sarcoidosis Sister   . Other Sister     OP/ Broken hip  . Other      whole family has charcot marie tooth  . Ovarian cancer Sister     Social History   Social History  . Marital Status: Widowed    Spouse Name: N/A  . Number of Children: N/A  . Years of Education: N/A   Occupational History  . Not on file.    Social History Main Topics  . Smoking status: Never Smoker   . Smokeless tobacco: Never Used  . Alcohol Use: No  . Drug Use: No  . Sexual Activity: Not on file   Other Topics Concern  . Not on file   Social History Narrative   G6P4   Son died 2 years ago cerebral anerysm     Constitutional: Denies fever, malaise, fatigue, headache or abrupt weight changes.  Respiratory: Denies difficulty breathing, shortness of breath, cough or sputum production.   Cardiovascular: Denies chest pain, chest tightness, palpitations or swelling in the hands or feet.  Musculoskeletal: Pt reports toe pain. Denies decrease in range of motion, difficulty with gait, muscle pain.  Skin: Pt reports redness and swelling of right great toe. Denies  rashes, lesions or ulcercations.    No other specific complaints in a complete review of systems (except as listed in HPI above).  Objective:   Physical Exam  BP 122/72 mmHg  Pulse 76  Temp(Src) 97.7 F (36.5 C) (Oral)  Wt 186 lb (84.369 kg)  SpO2 96% Wt Readings from Last 3 Encounters:  10/26/14 186 lb (84.369 kg)  07/12/14 174 lb (78.926 kg)  10/04/13 182 lb 4 oz (82.668 kg)    General: Appears her stated age, well developed, well nourished in NAD. Skin: Warm, dry and intact. Redness and swelling of right great toe. Cardiovascular: Normal rate and rhythm. S1,S2 noted.  Pulmonary/Chest: Normal effort and positive vesicular breath sounds. No respiratory distress. No wheezes, rales or ronchi noted.  Musculoskeletal: Normal flexion and extension of the right great toe. No difficulty with gait.  Neurological: Alert and oriented. Sensation intact to BLE.   BMET    Component Value Date/Time   NA 137 09/05/2013 0857   K 4.6 09/05/2013 0857   CL 103 09/05/2013 0857   CO2 27 09/05/2013 0857   GLUCOSE 96 09/05/2013 0857   BUN 17 09/05/2013 0857   CREATININE 0.7 09/05/2013 0857   CALCIUM 9.6 09/05/2013 0857   GFRNONAA 74.01 03/20/2009 0859   GFRAA   10/11/2008 0415    >60        The eGFR has been calculated using the MDRD equation. This calculation has not been validated in all clinical situations. eGFR's persistently <60 mL/min  signify possible Chronic Kidney Disease.    Lipid Panel     Component Value Date/Time   CHOL 181 09/05/2013 0857   TRIG 90.0 09/05/2013 0857   HDL 55.30 09/05/2013 0857   CHOLHDL 3 09/05/2013 0857   VLDL 18.0 09/05/2013 0857   LDLCALC 108* 09/05/2013 0857    CBC    Component Value Date/Time   WBC 5.2 09/05/2013 0857   RBC 4.20 09/05/2013 0857   HGB 13.0 09/05/2013 0857   HCT 39.1 09/05/2013 0857   PLT 183.0 09/05/2013 0857   MCV 93.1 09/05/2013 0857   MCHC 33.3 09/05/2013 0857   RDW 13.6 09/05/2013 0857   LYMPHSABS 1.6 09/05/2013 0857   MONOABS 0.3 09/05/2013 0857   EOSABS 0.1 09/05/2013 0857   BASOSABS 0.0 09/05/2013 0857    Hgb A1C Lab Results  Component Value Date   HGBA1C 6.6* 07/12/2014         Assessment & Plan:   Infected ingrown toenail:  eRx for Doxycycline 100 mg BID Keep leg elevated to help reduce swelling Continue Epsom salt soaks daily  RTC as needed or if symptoms persist or worsen

## 2014-10-26 NOTE — Progress Notes (Signed)
Pre visit review using our clinic review tool, if applicable. No additional management support is needed unless otherwise documented below in the visit note. 

## 2014-10-26 NOTE — Patient Instructions (Signed)
Infected Ingrown Toenail °An infected ingrown toenail occurs when the nail edge grows into the skin and bacteria invade the area. Symptoms include pain, tenderness, swelling, and pus drainage from the edge of the nail. Poorly fitting shoes, minor injuries, and improper cutting of the toenail may also contribute to the problem. You should cut your toenails squarely instead of rounding the edges. Do not cut them too short. Avoid tight or pointed toe shoes. Sometimes the ingrown portion of the nail must be removed. If your toenail is removed, it can take 3-4 months for it to re-grow. °HOME CARE INSTRUCTIONS  °· Soak your infected toe in warm water for 20-30 minutes, 2 to 3 times a day. °· Packing or dressings applied to the area should be changed daily. °· Take medicine as directed and finish them. °· Reduce activities and keep your foot elevated when able to reduce swelling and discomfort. Do this until the infection gets better. °· Wear sandals or go barefoot as much as possible while the infected area is sensitive. °· See your caregiver for follow-up care in 2-3 days if the infection is not better. °SEEK MEDICAL CARE IF:  °Your toe is becoming more red, swollen or painful. °MAKE SURE YOU:  °· Understand these instructions. °· Will watch your condition. °· Will get help right away if you are not doing well or get worse. °Document Released: 02/21/2004 Document Revised: 04/07/2011 Document Reviewed: 01/10/2008 °ExitCare® Patient Information ©2015 [employer]. This information is not intended to replace advice given to you by your health care Ferrin Liebig. Make sure you discuss any questions you have with your health care Adelyn Roscher. ° °

## 2014-10-27 ENCOUNTER — Telehealth: Payer: Self-pay

## 2014-10-27 DIAGNOSIS — L03031 Cellulitis of right toe: Secondary | ICD-10-CM

## 2014-10-27 MED ORDER — DOXYCYCLINE HYCLATE 100 MG PO TABS: 100.0000 mg | ORAL_TABLET | Freq: Two times a day (BID) | ORAL | Status: DC

## 2014-10-27 NOTE — Telephone Encounter (Signed)
Left message on voicemail Rx sent through e-scribe

## 2014-10-27 NOTE — Telephone Encounter (Signed)
[name], pts daughter, called asking about this rx. They are wanting it before the weekend and are concerned because it hasn't been called in yet. Best number to reach gail at is [phone].

## 2014-10-27 NOTE — Telephone Encounter (Signed)
pts daughter left v/m requesting cb to pt; pt was seen 10/25/14 and [facility] not [facility] rx for abx. In office note rx for Doxycycline 100 mg bid. What quantity do you want pt to have?  Please advise.

## 2014-10-27 NOTE — Telephone Encounter (Signed)
Pt daughter [name] called to apologize to say sorry if she seemed to pushy about this request. She did not realize that her other sister had initially called to request. [name] stated that [facility] does a wonderful job with her mothers refills.

## 2014-11-16 DIAGNOSIS — E119 Type 2 diabetes mellitus without complications: Secondary | ICD-10-CM | POA: Diagnosis not present

## 2014-11-16 DIAGNOSIS — H25013 Cortical age-related cataract, bilateral: Secondary | ICD-10-CM | POA: Diagnosis not present

## 2014-11-24 ENCOUNTER — Encounter (INDEPENDENT_AMBULATORY_CARE_PROVIDER_SITE_OTHER): Payer: Medicare Other | Admitting: Ophthalmology

## 2014-11-24 DIAGNOSIS — H35033 Hypertensive retinopathy, bilateral: Secondary | ICD-10-CM | POA: Diagnosis not present

## 2014-11-24 DIAGNOSIS — H353132 Nonexudative age-related macular degeneration, bilateral, intermediate dry stage: Secondary | ICD-10-CM | POA: Diagnosis not present

## 2014-11-24 DIAGNOSIS — H43813 Vitreous degeneration, bilateral: Secondary | ICD-10-CM | POA: Diagnosis not present

## 2014-11-24 DIAGNOSIS — Z79899 Other long term (current) drug therapy: Secondary | ICD-10-CM

## 2014-11-24 DIAGNOSIS — I10 Essential (primary) hypertension: Secondary | ICD-10-CM

## 2014-12-26 ENCOUNTER — Telehealth: Payer: Self-pay

## 2014-12-26 NOTE — Telephone Encounter (Signed)
Left message for patient regarding flu shot vaccination.

## 2015-03-27 ENCOUNTER — Other Ambulatory Visit: Payer: Self-pay | Admitting: Family Medicine

## 2015-04-16 ENCOUNTER — Ambulatory Visit (INDEPENDENT_AMBULATORY_CARE_PROVIDER_SITE_OTHER): Payer: Medicare Other

## 2015-04-16 VITALS — BP 122/80 | HR 68 | Temp 97.9°F | Ht <= 58 in | Wt 183.5 lb

## 2015-04-16 DIAGNOSIS — I10 Essential (primary) hypertension: Secondary | ICD-10-CM | POA: Diagnosis not present

## 2015-04-16 DIAGNOSIS — E0800 Diabetes mellitus due to underlying condition with hyperosmolarity without nonketotic hyperglycemic-hyperosmolar coma (NKHHC): Secondary | ICD-10-CM | POA: Diagnosis not present

## 2015-04-16 DIAGNOSIS — Z0000 Encounter for general adult medical examination without abnormal findings: Secondary | ICD-10-CM | POA: Diagnosis not present

## 2015-04-16 DIAGNOSIS — E785 Hyperlipidemia, unspecified: Secondary | ICD-10-CM | POA: Diagnosis not present

## 2015-04-16 DIAGNOSIS — E559 Vitamin D deficiency, unspecified: Secondary | ICD-10-CM

## 2015-04-16 LAB — CBC WITH DIFFERENTIAL/PLATELET
Basophils Absolute: 0 10*3/uL (ref 0.0–0.1)
Basophils Relative: 0.4 % (ref 0.0–3.0)
Eosinophils Absolute: 0.1 10*3/uL (ref 0.0–0.7)
Eosinophils Relative: 1.1 % (ref 0.0–5.0)
HCT: 38.4 % (ref 36.0–46.0)
Hemoglobin: 12.7 g/dL (ref 12.0–15.0)
Lymphocytes Relative: 33.6 % (ref 12.0–46.0)
Lymphs Abs: 2.3 10*3/uL (ref 0.7–4.0)
MCHC: 33.1 g/dL (ref 30.0–36.0)
MCV: 91.5 fl (ref 78.0–100.0)
Monocytes Absolute: 0.3 10*3/uL (ref 0.1–1.0)
Monocytes Relative: 5.2 % (ref 3.0–12.0)
Neutro Abs: 4 10*3/uL (ref 1.4–7.7)
Neutrophils Relative %: 59.7 % (ref 43.0–77.0)
Platelets: 175 10*3/uL (ref 150.0–400.0)
RBC: 4.2 Mil/uL (ref 3.87–5.11)
RDW: 13.9 % (ref 11.5–15.5)
WBC: 6.8 10*3/uL (ref 4.0–10.5)

## 2015-04-16 LAB — COMPREHENSIVE METABOLIC PANEL
ALBUMIN: 3.9 g/dL (ref 3.5–5.2)
ALT: 18 U/L (ref 0–35)
AST: 15 U/L (ref 0–37)
Alkaline Phosphatase: 52 U/L (ref 39–117)
BILIRUBIN TOTAL: 0.4 mg/dL (ref 0.2–1.2)
BUN: 26 mg/dL — ABNORMAL HIGH (ref 6–23)
CALCIUM: 9.5 mg/dL (ref 8.4–10.5)
CO2: 27 meq/L (ref 19–32)
Chloride: 101 mEq/L (ref 96–112)
Creatinine, Ser: 0.77 mg/dL (ref 0.40–1.20)
GFR: 76.16 mL/min (ref >60.00)
Glucose, Bld: 106 mg/dL — ABNORMAL HIGH (ref 70–99)
Potassium: 4.4 mEq/L (ref 3.5–5.1)
Sodium: 135 mEq/L (ref 135–145)
Total Protein: 6.6 g/dL (ref 6.0–8.3)

## 2015-04-16 LAB — LIPID PANEL
Cholesterol: 161 mg/dL (ref 0–200)
HDL: 58.3 mg/dL (ref >39.00)
LDL Cholesterol: 80 mg/dL (ref 0–99)
NonHDL: 103.19
TRIGLYCERIDES: 118 mg/dL (ref 0.0–149.0)
Total CHOL/HDL Ratio: 3
VLDL: 23.6 mg/dL (ref 0.0–40.0)

## 2015-04-16 LAB — HEMOGLOBIN A1C: Hgb A1c MFr Bld: 7.1 % — ABNORMAL HIGH (ref 4.6–6.5)

## 2015-04-16 LAB — VITAMIN D 25 HYDROXY (VIT D DEFICIENCY, FRACTURES): VITD: 61.26 ng/mL (ref 30.00–100.00)

## 2015-04-16 LAB — TSH: TSH: 0.69 u[IU]/mL (ref 0.35–4.50)

## 2015-04-16 NOTE — Progress Notes (Signed)
   Subjective:    Patient ID: Lydia King, female    DOB: 1930/06/18, 82 y.o.   MRN: [id number]  HPI    Review of Systems     Objective:   Physical Exam        Assessment & Plan:  I reviewed health advisor's note, was available for consultation, and agree with documentation and plan.

## 2015-04-16 NOTE — Progress Notes (Signed)
Pre visit review using our clinic review tool, if applicable. No additional management support is needed unless otherwise documented below in the visit note. 

## 2015-04-16 NOTE — Progress Notes (Signed)
Subjective:   Lydia King is a 82 y.o. female who presents for Medicare Annual (Subsequent) preventive examination.  Cardiac Risk Factors include: advanced age (>21men, >55 women);diabetes mellitus;dyslipidemia;hypertension;obesity (BMI >30kg/m2);sedentary lifestyle     Objective:     Vitals: BP 122/80 mmHg  Pulse 68  Temp(Src) 97.9 F (36.6 C) (Oral)  Ht 4\' 10"  (1.473 m)  Wt 183 lb 8 oz (83.235 kg)  BMI 38.36 kg/m2  SpO2 95%  Body mass index is 38.36 kg/(m^2).   Tobacco History  Smoking status  . Never Smoker   Smokeless tobacco  . Never Used     Counseling given: No   Past Medical History  Diagnosis Date  . Anemia     in past (from blood loss at hiatal hernia)  . Hiatal hernia   . Charcot-Marie-Tooth disease     walks with cane (fairly controlled)  . OA (ocular albinism) ([place])   . Asthma   . Diverticulosis   . Headache(784.0)     migraine  . GERD (gastroesophageal reflux disease)   . PUD (peptic ulcer disease)     gastric, past  . Hypertension   . Hyperlipidemia   . DVT (deep venous thrombosis) ([place]) 2003    after sx  . Urine incontinence   . Grief reaction     on fluoxetine  . Osteopenia   . Shingles   . Stroke [facility]) 8/10    posterior reversible encephalopathy syndrome   Past Surgical History  Procedure Laterality Date  . Hiatal hernia repair  2000    needed blood transfusion  . Total knee arthroplasty  03,06    Bilateral  . Vesicovaginal fistula closure w/ tah  1964    with bleeding and cervical dysplasia  . Hernia repair  2002    bleeding  . Colonoscopy  2002  . Stroke  8/10    hosp stroke/ posterior (vs posterior reversible encephalopathy syndrome) - d/c on Plavix  . Tee normal  [id number]    no emoblic source  . Carotid dopplers  8/10   Family History  Problem Relation Age of Onset  . Cerebral aneurysm Son   . Cerebral aneurysm Father   . Stroke Mother   . Cervical cancer Sister   . Hyperlipidemia Father   . Alcohol abuse  Father   . Hyperlipidemia Brother   . Alcohol abuse Brother   . Stroke Brother   . Diabetes Brother   . Diabetes Sister   . Sarcoidosis Sister   . Other Sister     OP/ Broken hip  . Other      whole family has charcot marie tooth  . Ovarian cancer Sister    History  Sexual Activity  . Sexual Activity: No    Outpatient Encounter Prescriptions as of 04/16/2015  Medication Sig  . acetaminophen (TYLENOL) 500 MG tablet Take 500 mg by mouth every 6 (six) hours as needed.    Marland Kitchen albuterol (PROVENTIL HFA;VENTOLIN HFA) 108 (90 BASE) MCG/ACT inhaler Inhale 2 puffs into the lungs every 4 (four) hours as needed for wheezing or shortness of breath.  . Cholecalciferol (VITAMIN D) 2000 UNITS CAPS Take 2 capsules by mouth daily.    . clopidogrel (PLAVIX) 75 MG tablet Take 1 tablet (75 mg total) by mouth daily. **needs to schedule follow-up with doctor before future refills are given**  . CRESTOR 10 MG tablet TAKE 1 TABLET BY MOUTH AT BEDTIME  . doxycycline (VIBRA-TABS) 100 MG tablet Take 1 tablet (100 mg  total) by mouth 2 (two) times daily.  Marland Kitchen esomeprazole (NEXIUM) 40 MG capsule Take 1 capsule (40 mg total) by mouth daily before breakfast. **needs to schedule follow-up with doctor before future refills are given**  . Flaxseed, Linseed, (FLAX SEED OIL) 1000 MG CAPS Take 1 capsule by mouth daily.    Marland Kitchen FLUoxetine (PROZAC) 20 MG capsule TAKE ONE CAPSULE BY MOUTH EVERY DAY  . losartan (COZAAR) 100 MG tablet TAKE 1/2 TAB BY MOUTH ONCE A DAY  . metFORMIN (GLUCOPHAGE) 500 MG tablet TAKE 1 TABLET BY MOUTH TWICE A DAY WITH MEALS  . Misc. Devices (WALKER) MISC Wheeled walker with seat- Pt must use for ambulation-- for use as directed: ambulation dx: 729.5, 780.79, 786.05, 784.0, 715.89.   Marland Kitchen omega-3 acid ethyl esters (LOVAZA) 1 g capsule Take 1 capsule (1 g total) by mouth daily. **needs to schedule follow-up with doctor before future refills are given**  . spironolactone (ALDACTONE) 50 MG tablet Take 1 tablet (50  mg total) by mouth daily. **needs to schedule follow-up with doctor before future refills are given**   No facility-administered encounter medications on file as of 04/16/2015.    Activities of Daily Living In your present state of health, do you have any difficulty performing the following activities: 04/16/2015  Hearing? Y  Vision? Y  Difficulty concentrating or making decisions? N  Walking or climbing stairs? Y  Dressing or bathing? Y  Doing errands, shopping? Y  Preparing Food and eating ? N  Using the Toilet? N  In the past six months, have you accidently leaked urine? Y  Do you have problems with loss of bowel control? N  Managing your Medications? Y  Managing your Finances? Y  Housekeeping or managing your Housekeeping? Y    Patient Care Team: Abner Greenspan, MD as PCP - General    Assessment:      Hearing Screening   125Hz  250Hz  500Hz  1000Hz  2000Hz  4000Hz  8000Hz   Right ear:   40 40 40 0   Left ear:   0 0 40 0   Vision Screening Comments: Last eye exam Fall 2016 with Dr. Marvel Plan  Exercise Activities and Dietary recommendations Current Exercise Habits: Home exercise routine, Type of exercise: Other - see comments (chair exercises), Time (Minutes): 15, Frequency (Times/Week): 4, Weekly Exercise (Minutes/Week): 60, Intensity: Mild, Exercise limited by: orthopedic condition(s)  Goals    . Increase physical activity     Starting 04/16/2015, I will continue to exercise for 15 min 4 days per week.       Fall Risk Fall Risk  04/16/2015 10/04/2013 11/19/2011  Falls in the past year? Yes Yes -  Number falls in past yr: 1 1 -  Injury with Fall? No No -  Risk for fall due to : Impaired balance/gait;Impaired mobility;Impaired vision;History of fall(s) - Impaired mobility;Impaired vision  Risk for fall due to (comments): - - has supervision and uses a walker in and out of the home ,ha  Follow up Falls evaluation completed - -   Depression Screen PHQ 2/9 Scores 04/16/2015  10/04/2013 11/19/2011  PHQ - 2 Score 0 0 0     Cognitive Testing MMSE - Mini Mental State Exam 04/16/2015  Orientation to time 5  Orientation to Place 5  Registration 3  Attention/ Calculation 3  Recall 3  Language- name 2 objects 0  Language- repeat 1  Language- follow 3 step command 3  Language- read & follow direction 0  Write a sentence 0  Copy  design 0  Total score 23    Immunization History  Administered Date(s) Administered  . Influenza Split 11/22/2010, 11/18/2011  . Influenza-Unspecified 10/27/2012, 10/27/2013  . Pneumococcal Conjugate-13 10/04/2013  . Pneumococcal Polysaccharide-23 01/27/2005  . Td 05/26/2007   Screening Tests Health Maintenance  Topic Date Due  . INFLUENZA VACCINE  04/27/2015 (Originally 08/28/2014)  . DEXA SCAN  04/28/2015 (Originally 08/19/1997)  . ZOSTAVAX  02/29/2020 (Originally 08/19/1992)  . FOOT EXAM  07/12/2015  . HEMOGLOBIN A1C  10/17/2015  . OPHTHALMOLOGY EXAM  10/28/2015  . TETANUS/TDAP  05/25/2017  . PNA vac Low Risk Adult  Completed      Plan:      I have personally reviewed the Medicare Annual Wellness questionnaire and have noted the following in the patient's chart:  A. Medical and social history B. Use of alcohol, tobacco or illicit drugs  C. Current medications and supplements D. Functional ability and status E.  Nutritional status F.  Physical activity G. Advance directives H. List of other physicians I.  Hospitalizations, surgeries, and ER visits in previous 12 months J.  [place] to include hearing, vision, cognitive, depression L. Referrals and appointments - none  In addition, I reviewed preventive protocols, quality metrics, and best practice recommendations specific to patient. A written personalized care plan for preventive services as well as general preventive health recommendations were provided to patient.   Specifically, the patient was given info on:  Bone density  - will discuss at next  appt Flu - declined citing allergic reaction to last vaccine  See attached scanned questionnaire for additional information.   Signed,   Lindell Noe, MHA, BS, LPN Health Advisor [phone]

## 2015-04-16 NOTE — Patient Instructions (Signed)
Lydia King , Thank you for taking time to come for your Medicare Wellness Visit. I appreciate your ongoing commitment to your health goals. Please review the following plan we discussed and let me know if I can assist you in the future.   These are the goals we discussed: Goals    . Increase physical activity     Starting 04/16/2015, I will continue to exercise for 15 min 4 days per week.        This is a list of the screening recommended for you and due dates:  Health Maintenance  Topic Date Due  . DEXA scan (bone density measurement)  Will discuss at next appt  . Eye exam for diabetics  11/11/2014  . Flu Shot  Declined - allergic reaction  . Hemoglobin A1C  10/17/2015  . Shingles Vaccine  Declined  . Complete foot exam   10/17/2015  . Tetanus Vaccine  05/25/2017  . Pneumonia vaccines  Completed  *Topic was postponed. The date shown is not the original due date.   Preventive Care for Adults  A healthy lifestyle and preventive care can promote health and wellness. Preventive health guidelines for adults include the following key practices.  . A routine yearly physical is a good way to check with your health care provider about your health and preventive screening. It is a chance to share any concerns and updates on your health and to receive a thorough exam.  . Visit your dentist for a routine exam and preventive care every 6 months. Brush your teeth twice a day and floss once a day. Good oral hygiene prevents tooth decay and gum disease.  . The frequency of eye exams is based on your age, health, family medical history, use  of contact lenses, and other factors. Follow your health care provider's ecommendations for frequency of eye exams.  . Eat a healthy diet. Foods like vegetables, fruits, whole grains, low-fat dairy products, and lean protein foods contain the nutrients you need without too many calories. Decrease your intake of foods high in solid fats, added sugars, and  salt. Eat the right amount of calories for you. Get information about a proper diet from your health care provider, if necessary.  . Regular physical exercise is one of the most important things you can do for your health. Most adults should get at least 150 minutes of moderate-intensity exercise (any activity that increases your heart rate and causes you to sweat) each week. In addition, most adults need muscle-strengthening exercises on 2 or more days a week.  Silver Sneakers may be a benefit available to you. To determine eligibility, you may visit the website: www.silversneakers.com or contact program at 937-293-8927 Mon-Fri between 8AM-8PM.   . Maintain a healthy weight. The body mass index (BMI) is a screening tool to identify possible weight problems. It provides an estimate of body fat based on height and weight. Your health care provider can find your BMI and can help you achieve or maintain a healthy weight.   For adults 20 years and older: ? A BMI below 18.5 is considered underweight. ? A BMI of 18.5 to 24.9 is normal. ? A BMI of 25 to 29.9 is considered overweight. ? A BMI of 30 and above is considered obese.   . Maintain normal blood lipids and cholesterol levels by exercising and minimizing your intake of saturated fat. Eat a balanced diet with plenty of fruit and vegetables. Blood tests for lipids and cholesterol should begin at  age 35 and be repeated every 5 years. If your lipid or cholesterol levels are high, you are over 50, or you are at high risk for heart disease, you may need your cholesterol levels checked more frequently. Ongoing high lipid and cholesterol levels should be treated with medicines if diet and exercise are not working.  . If you smoke, find out from your health care provider how to quit. If you do not use tobacco, please do not start.  . If you choose to drink alcohol, please do not consume more than 2 drinks per day. One drink is considered to be 12 ounces  (355 mL) of beer, 5 ounces (148 mL) of wine, or 1.5 ounces (44 mL) of liquor.  . If you are 13-56 years old, ask your health care provider if you should take aspirin to prevent strokes.  . Use sunscreen. Apply sunscreen liberally and repeatedly throughout the day. You should seek shade when your shadow is shorter than you. Protect yourself by wearing long sleeves, pants, a wide-brimmed hat, and sunglasses year round, whenever you are outdoors.  . Once a month, do a whole body skin exam, using a mirror to look at the skin on your back. Tell your health care provider of new moles, moles that have irregular borders, moles that are larger than a pencil eraser, or moles that have changed in shape or color.

## 2015-04-16 NOTE — Progress Notes (Signed)
Patient concerns:  Patient reported pain to bilateral nasal cavities and tenderness to areas under both eyes. 5/10 with onset approx. 3 weeks ago. Skin around nose is dry and intact. No presence of blood to inner nasal cavities. Treats pain with Tylenol 500 mg 2 tablets PO once daily. Patient also uses a saline nasal wash daily.

## 2015-04-18 ENCOUNTER — Ambulatory Visit (INDEPENDENT_AMBULATORY_CARE_PROVIDER_SITE_OTHER): Payer: Medicare Other | Admitting: Family Medicine

## 2015-04-18 ENCOUNTER — Encounter: Payer: Self-pay | Admitting: Family Medicine

## 2015-04-18 VITALS — BP 118/74 | HR 76 | Temp 98.0°F | Wt 182.5 lb

## 2015-04-18 DIAGNOSIS — E119 Type 2 diabetes mellitus without complications: Secondary | ICD-10-CM | POA: Diagnosis not present

## 2015-04-18 DIAGNOSIS — E785 Hyperlipidemia, unspecified: Secondary | ICD-10-CM | POA: Diagnosis not present

## 2015-04-18 DIAGNOSIS — E559 Vitamin D deficiency, unspecified: Secondary | ICD-10-CM

## 2015-04-18 DIAGNOSIS — H9192 Unspecified hearing loss, left ear: Secondary | ICD-10-CM

## 2015-04-18 DIAGNOSIS — I10 Essential (primary) hypertension: Secondary | ICD-10-CM | POA: Diagnosis not present

## 2015-04-18 DIAGNOSIS — H9190 Unspecified hearing loss, unspecified ear: Secondary | ICD-10-CM | POA: Insufficient documentation

## 2015-04-18 NOTE — Patient Instructions (Addendum)
Cut back on the fruit - make servings smaller (it is a carbohydrate)  Stay as active as you can be  Keep working on weight loss Blood pressure is great  Cholesterol is great  If you are interested in an audiology (hearing) evaluation referral in the future - please let us know   Schedule non fasting labs in 3 months for A1C

## 2015-04-18 NOTE — Progress Notes (Signed)
Pre visit review using our clinic review tool, if applicable. No additional management support is needed unless otherwise documented below in the visit note. 

## 2015-04-18 NOTE — Progress Notes (Signed)
Subjective:    Patient ID: Lydia King, female    DOB: 03/17/1930, 82 y.o.   MRN: [id number]  HPI Here for f/u of chronic health problems   Rev AMW visit with Katha Cabal Noted L sided hearing loss-pt has tinnitus and declines further eval and tx  Will update [place] if she changes her mind   Also had nasal pain- better now  Dry and cracking and sore  Used neosporin and a lot better  Also allergies/runny nose  Nasal saline helps congestion   Wt is stable - down 1 lb with bmi of 38 Obese range  bp is stable today  No cp or palpitations or headaches or edema  No side effects to medicines  BP Readings from Last 3 Encounters:  04/18/15 118/74  04/16/15 122/80  10/26/14 122/72     Good control!  Diabetes Home sugar results - does not check all the time - it tends to go up when she is in pain  DM diet -she has been doing about the same but eating more fruit (she was trying to use the wt watchers program) Exercise -not much she can do/ does some chair exercise and walks a bit in the house  Symptoms- A1C last  Lab Results  Component Value Date   HGBA1C 7.1* 04/16/2015   This is up from 6.6  No problems with medications -metformin 500 Renal protection-on ARB Last eye exam 10/16  Hyperlipidemia crestor and diet Lab Results  Component Value Date   CHOL 161 04/16/2015   CHOL 181 09/05/2013   CHOL 186 04/20/2013   Lab Results  Component Value Date   HDL 58.30 04/16/2015   HDL 55.30 09/05/2013   HDL 53.10 04/20/2013   Lab Results  Component Value Date   LDLCALC 80 04/16/2015   LDLCALC 108* 09/05/2013   LDLCALC 104* 04/20/2013   Lab Results  Component Value Date   TRIG 118.0 04/16/2015   TRIG 90.0 09/05/2013   TRIG 146.0 04/20/2013   Lab Results  Component Value Date   CHOLHDL 3 04/16/2015   CHOLHDL 3 09/05/2013   CHOLHDL 4 04/20/2013   Lab Results  Component Value Date   LDLDIRECT 237.4 07/14/2008   LDLDIRECT 237.6 11/01/2007   LDLDIRECT 228.4 08/16/2007   is eating better and smaller portions and less fat  No fried food at all    Hx of D def D level is good at 61 on current supplementation  No fractures   Has had one fall since she was last seen- bend over to reach cane     Patient Active Problem List   Diagnosis Date Noted  . Encounter for Medicare annual wellness exam 10/04/2013  . DM type 2 (diabetes mellitus, type 2) ([place]) 08/31/2012  . CVA 09/12/2008  . OSTEOARTHRITIS, MULTIPLE JOINTS 05/30/2008  . OSTEOPENIA 05/30/2008  . Vitamin D deficiency 07/14/2007  . SLEEP APNEA 07/14/2007  . Hyperlipidemia 05/26/2007  . Essential hypertension 11/30/2006  . Asthma 11/30/2006  . H/O gastroesophageal reflux (GERD) 11/30/2006  . G I BLEED 11/30/2006  . DEEP VENOUS THROMBOPHLEBITIS, HX OF 11/30/2006   Past Medical History  Diagnosis Date  . Anemia     in past (from blood loss at hiatal hernia)  . Hiatal hernia   . Charcot-Marie-Tooth disease     walks with cane (fairly controlled)  . OA (ocular albinism) ([place])   . Asthma   . Diverticulosis   . Headache(784.0)     migraine  . GERD (gastroesophageal  reflux disease)   . PUD (peptic ulcer disease)     gastric, past  . Hypertension   . Hyperlipidemia   . DVT (deep venous thrombosis) ([place]) 2003    after sx  . Urine incontinence   . Grief reaction     on fluoxetine  . Osteopenia   . Shingles   . Stroke [facility]) 8/10    posterior reversible encephalopathy syndrome   Past Surgical History  Procedure Laterality Date  . Hiatal hernia repair  2000    needed blood transfusion  . Total knee arthroplasty  03,06    Bilateral  . Vesicovaginal fistula closure w/ tah  1964    with bleeding and cervical dysplasia  . Hernia repair  2002    bleeding  . Colonoscopy  2002  . Stroke  8/10    hosp stroke/ posterior (vs posterior reversible encephalopathy syndrome) - d/c on Plavix  . Tee normal  [id number]    no emoblic source  . Carotid dopplers  8/10   Social History  Substance Use  Topics  . Smoking status: Never Smoker   . Smokeless tobacco: Never Used  . Alcohol Use: No   Family History  Problem Relation Age of Onset  . Cerebral aneurysm Son   . Cerebral aneurysm Father   . Stroke Mother   . Cervical cancer Sister   . Hyperlipidemia Father   . Alcohol abuse Father   . Hyperlipidemia Brother   . Alcohol abuse Brother   . Stroke Brother   . Diabetes Brother   . Diabetes Sister   . Sarcoidosis Sister   . Other Sister     OP/ Broken hip  . Other      whole family has charcot marie tooth  . Ovarian cancer Sister    Allergies  Allergen Reactions  . Amlodipine Besylate     REACTION: edema  . Atorvastatin     REACTION: pain- could not walk  . Ciprofloxacin     REACTION: unkown  . Codeine     REACTION: itchy rash  . Nitrofurantoin   . Omeprazole     REACTION: not effective  . Pantoprazole Sodium     REACTION: does not work well  . Penicillins     REACTION: itchy rash  . Prednisone     REACTION: RASH.// SHORTNESS OF BREATH  . Simvastatin     REACTION: leg pain  . Sulfonamide Derivatives     REACTION: itchy rash   Current Outpatient Prescriptions on File Prior to Visit  Medication Sig Dispense Refill  . acetaminophen (TYLENOL) 500 MG tablet Take 500 mg by mouth every 6 (six) hours as needed.      Marland Kitchen albuterol (PROVENTIL HFA;VENTOLIN HFA) 108 (90 BASE) MCG/ACT inhaler Inhale 2 puffs into the lungs every 4 (four) hours as needed for wheezing or shortness of breath. 1 Inhaler 11  . Cholecalciferol (VITAMIN D) 2000 UNITS CAPS Take 2 capsules by mouth daily.      . clopidogrel (PLAVIX) 75 MG tablet Take 1 tablet (75 mg total) by mouth daily. **needs to schedule follow-up with doctor before future refills are given** 90 tablet 0  . CRESTOR 10 MG tablet TAKE 1 TABLET BY MOUTH AT BEDTIME 90 tablet 2  . doxycycline (VIBRA-TABS) 100 MG tablet Take 1 tablet (100 mg total) by mouth 2 (two) times daily. 20 tablet 0  . esomeprazole (NEXIUM) 40 MG capsule Take 1  capsule (40 mg total) by mouth daily before breakfast. **needs to  schedule follow-up with doctor before future refills are given** 90 capsule 0  . Flaxseed, Linseed, (FLAX SEED OIL) 1000 MG CAPS Take 1 capsule by mouth daily.      Marland Kitchen FLUoxetine (PROZAC) 20 MG capsule TAKE ONE CAPSULE BY MOUTH EVERY DAY 90 capsule 3  . losartan (COZAAR) 100 MG tablet TAKE 1/2 TAB BY MOUTH ONCE A DAY 45 tablet 12  . metFORMIN (GLUCOPHAGE) 500 MG tablet TAKE 1 TABLET BY MOUTH TWICE A DAY WITH MEALS 180 tablet 3  . Misc. Devices (WALKER) MISC Wheeled walker with seat- Pt must use for ambulation-- for use as directed: ambulation dx: 729.5, 780.79, 786.05, 784.0, 715.89.     Marland Kitchen omega-3 acid ethyl esters (LOVAZA) 1 g capsule Take 1 capsule (1 g total) by mouth daily. **needs to schedule follow-up with doctor before future refills are given** 90 capsule 0  . spironolactone (ALDACTONE) 50 MG tablet Take 1 tablet (50 mg total) by mouth daily. **needs to schedule follow-up with doctor before future refills are given** 90 tablet 0   No current facility-administered medications on file prior to visit.      Review of Systems Review of Systems  Constitutional: Negative for fever, appetite change, fatigue and unexpected weight change.  Eyes: Negative for pain and visual disturbance.  ENT pos for hearing loss /neg for cong and pos for rhinorrhea from allergies  Respiratory: Negative for cough and shortness of breath.   Cardiovascular: Negative for cp or palpitations    Gastrointestinal: Negative for nausea, diarrhea and constipation.  Genitourinary: Negative for urgency and frequency.  Skin: Negative for pallor or rash   Neurological: Negative for weakness, light-headedness, numbness and headaches.  Hematological: Negative for adenopathy. Does not bruise/bleed easily.  Psychiatric/Behavioral: Negative for dysphoric mood. The patient is not nervous/anxious.         Objective:   Physical Exam  Constitutional: She appears  well-developed and well-nourished. No distress.  obese and well appearing   HENT:  Head: Normocephalic and atraumatic.  Mouth/Throat: Oropharynx is clear and moist.  Eyes: Conjunctivae and EOM are normal. Pupils are equal, round, and reactive to light.  Neck: Normal range of motion. Neck supple. No JVD present. Carotid bruit is not present. No thyromegaly present.  Cardiovascular: Normal rate, regular rhythm, normal heart sounds and intact distal pulses.  Exam reveals no gallop.   Pulmonary/Chest: Effort normal and breath sounds normal. No respiratory distress. She has no wheezes. She has no rales.  No crackles  Abdominal: Soft. Bowel sounds are normal. She exhibits no distension, no abdominal bruit and no mass. There is no tenderness.  Musculoskeletal: She exhibits no edema.  Mild kyphosis   OA changes in fingers  Lymphadenopathy:    She has no cervical adenopathy.  Neurological: She is alert. She has normal reflexes. No cranial nerve deficit. She exhibits normal muscle tone. Coordination normal.  Skin: Skin is warm and dry. No rash noted.  Psychiatric: She has a normal mood and affect.          Assessment & Plan:   Problem List Items Addressed This Visit      Cardiovascular and Mediastinum   Essential hypertension - Primary    bp in fair control at this time  BP Readings from Last 1 Encounters:  04/18/15 118/74   No changes needed Disc lifstyle change with low sodium diet and exercise  Labs reviewed          Endocrine   DM type 2 (diabetes mellitus, type 2) (  [place])    Lab Results  Component Value Date   HGBA1C 7.1* 04/16/2015   This is up due to increased fruit intake  Disc imp of portion control Rev low glycemic diet  Re check A1C 3 mo  F/u 6 mo if it is under 7         Nervous and Auditory   Hearing loss    Long term hearing loss with tinnitus L ear Rev hearing screen from AMW visit  She is not interested in further eval at this time Enc her to let us  know if she wants a ref in the future         Other   Hyperlipidemia    Disc goals for lipids and reasons to control them Rev labs with pt Rev low sat fat diet in detail Continue statin and diet       Vitamin D deficiency    Vitamin D level is therapeutic with current supplementation Disc importance of this to bone and overall health

## 2015-04-19 NOTE — Assessment & Plan Note (Signed)
Vitamin D level is therapeutic with current supplementation Disc importance of this to bone and overall health  

## 2015-04-19 NOTE — Assessment & Plan Note (Signed)
Long term hearing loss with tinnitus L ear Rev hearing screen from [facility] visit  She is not interested in further eval at this time Enc her to let us know if she wants a ref in the future

## 2015-04-19 NOTE — Assessment & Plan Note (Signed)
Disc goals for lipids and reasons to control them Rev labs with pt Rev low sat fat diet in detail Continue statin and diet  

## 2015-04-19 NOTE — Assessment & Plan Note (Signed)
Lab Results  Component Value Date   HGBA1C 7.1* 04/16/2015   This is up due to increased fruit intake  Disc imp of portion control Rev low glycemic diet  Re check A1C 3 mo  F/u 6 mo if it is under 7

## 2015-04-19 NOTE — Assessment & Plan Note (Signed)
bp in fair control at this time  BP Readings from Last 1 Encounters:  04/18/15 118/74   No changes needed Disc lifstyle change with low sodium diet and exercise  Labs reviewed

## 2015-04-21 DIAGNOSIS — M791 Myalgia: Secondary | ICD-10-CM | POA: Diagnosis not present

## 2015-04-21 DIAGNOSIS — J101 Influenza due to other identified influenza virus with other respiratory manifestations: Secondary | ICD-10-CM | POA: Diagnosis not present

## 2015-04-22 ENCOUNTER — Encounter: Payer: Self-pay | Admitting: Family Medicine

## 2015-04-23 ENCOUNTER — Telehealth: Payer: Self-pay | Admitting: Family Medicine

## 2015-04-23 NOTE — Telephone Encounter (Signed)
Letter to get deduction for otc products    In IN box-please call family for pick up

## 2015-04-24 NOTE — Telephone Encounter (Signed)
Please mail letter  [address], [place] [address]

## 2015-04-24 NOTE — Telephone Encounter (Signed)
Left voicemail with Lydia King asking if she wanted to pick up letter or did she want [place] to mail it

## 2015-04-25 NOTE — Telephone Encounter (Signed)
Letter mailed

## 2015-05-02 ENCOUNTER — Other Ambulatory Visit: Payer: Self-pay | Admitting: Family Medicine

## 2015-06-26 ENCOUNTER — Other Ambulatory Visit: Payer: Self-pay | Admitting: Family Medicine

## 2015-06-29 ENCOUNTER — Telehealth: Payer: Self-pay

## 2015-06-29 NOTE — Telephone Encounter (Signed)
Aware of this - she needs to be on both and has been on them for a while

## 2015-06-29 NOTE — Telephone Encounter (Signed)
[facility] Lydia King about possible drug interaction between Plavix and Nexium; Nexium can decrease the effectiveness of Plavix. [facility] request cb.

## 2015-07-03 NOTE — Telephone Encounter (Signed)
Lisa from pharmacy called to follow-up on this previous call.  Guy Franco of Dr. Marliss Coots response.

## 2015-07-09 ENCOUNTER — Other Ambulatory Visit: Payer: Self-pay | Admitting: Family Medicine

## 2015-07-16 ENCOUNTER — Other Ambulatory Visit: Payer: Self-pay | Admitting: Family Medicine

## 2015-07-28 ENCOUNTER — Other Ambulatory Visit: Payer: Self-pay | Admitting: Family Medicine

## 2015-09-29 DIAGNOSIS — M5441 Lumbago with sciatica, right side: Secondary | ICD-10-CM | POA: Diagnosis not present

## 2015-09-30 ENCOUNTER — Encounter: Payer: Self-pay | Admitting: Family Medicine

## 2015-10-01 ENCOUNTER — Telehealth: Payer: Self-pay | Admitting: Family Medicine

## 2015-10-01 NOTE — Telephone Encounter (Signed)
[employer] call: Patient having back pain. Family member asking if ok to use NSAID's. Advised nurse that patient is to avoid NSAID's given hx of GI bleed. Advised tylenol 1000 mg TID PRN.

## 2015-10-02 ENCOUNTER — Ambulatory Visit (INDEPENDENT_AMBULATORY_CARE_PROVIDER_SITE_OTHER): Payer: Medicare Other | Admitting: Family Medicine

## 2015-10-02 ENCOUNTER — Telehealth: Payer: Self-pay

## 2015-10-02 ENCOUNTER — Encounter: Payer: Self-pay | Admitting: Family Medicine

## 2015-10-02 ENCOUNTER — Ambulatory Visit (INDEPENDENT_AMBULATORY_CARE_PROVIDER_SITE_OTHER)
Admission: RE | Admit: 2015-10-02 | Discharge: 2015-10-02 | Disposition: A | Discharge status: Home / Self Care | Payer: Medicare Other | Source: Ambulatory Visit | Attending: Family Medicine | Admitting: Family Medicine

## 2015-10-02 VITALS — BP 118/64 | HR 77 | Temp 98.2°F | Ht <= 58 in | Wt 182.0 lb

## 2015-10-02 DIAGNOSIS — M5441 Lumbago with sciatica, right side: Secondary | ICD-10-CM

## 2015-10-02 DIAGNOSIS — M545 Low back pain: Secondary | ICD-10-CM

## 2015-10-02 DIAGNOSIS — M5136 Other intervertebral disc degeneration, lumbar region: Secondary | ICD-10-CM | POA: Diagnosis not present

## 2015-10-02 DIAGNOSIS — M25551 Pain in right hip: Secondary | ICD-10-CM | POA: Diagnosis not present

## 2015-10-02 DIAGNOSIS — M5440 Lumbago with sciatica, unspecified side: Secondary | ICD-10-CM | POA: Insufficient documentation

## 2015-10-02 DIAGNOSIS — M1611 Unilateral primary osteoarthritis, right hip: Secondary | ICD-10-CM | POA: Diagnosis not present

## 2015-10-02 LAB — POC URINALSYSI DIPSTICK (AUTOMATED)
Bilirubin, UA: NEGATIVE
Blood, UA: NEGATIVE
Glucose, UA: NEGATIVE
Ketones, UA: NEGATIVE
NITRITE UA: NEGATIVE
PH UA: 6
PROTEIN UA: NEGATIVE
Spec Grav, UA: 1.015
UROBILINOGEN UA: 0.2

## 2015-10-02 MED ORDER — PREDNISONE 10 MG PO TABS: ORAL_TABLET | ORAL | 0 refills | Status: DC

## 2015-10-02 NOTE — Telephone Encounter (Signed)
Pt has appt with DR Glori Bickers 10/02/15 at 10:15

## 2015-10-02 NOTE — Telephone Encounter (Signed)
She was seen  

## 2015-10-02 NOTE — Progress Notes (Signed)
Subjective:    Patient ID: Lydia King, female    DOB: 1930/06/24, 82 y.o.   MRN: [id number]  HPI Here for low back pain   Started 3 weeks ago  No injury or new activity  A little pain that got worse and worse and worse  Feels like her chrarcot marie tooth   Now going down her R leg  No numbness  No tingling  Constant ache when still  Sharp/ burning pain with movement   Best to lie on R side on bed  Cannot tolerate lying on her back  Heating pad/ moist heat helps   Worst is standing  Worse the more she walks  Is also stiff after inactivity  No rash on her back   Also pain in R hip /buttock and in R groin   UA is bland today    Was seen Sat and given muscle relaxer metaxalone - helped a tiny bit  Hx of PUD/ GI bleed- she cannot take nsaids She did try an ibuprofen - did help her pain a bit  Was at the [facility] -dx with acute back pain with sciatica  Was told to take tylenol   She has not had an xray for a long time   Orthopedic - does not have one right now    Patient Active Problem List   Diagnosis Date Noted  . Low back pain with sciatica 10/02/2015  . Right hip pain 10/02/2015  . Hearing loss 04/18/2015  . Encounter for Medicare annual wellness exam 10/04/2013  . DM type 2 (diabetes mellitus, type 2) ([place]) 08/31/2012  . CVA 09/12/2008  . OSTEOARTHRITIS, MULTIPLE JOINTS 05/30/2008  . OSTEOPENIA 05/30/2008  . Vitamin D deficiency 07/14/2007  . SLEEP APNEA 07/14/2007  . Hyperlipidemia 05/26/2007  . Essential hypertension 11/30/2006  . Asthma 11/30/2006  . H/O gastroesophageal reflux (GERD) 11/30/2006  . G I BLEED 11/30/2006  . DEEP VENOUS THROMBOPHLEBITIS, HX OF 11/30/2006   Past Medical History:  Diagnosis Date  . Anemia    in past (from blood loss at hiatal hernia)  . Asthma   . Charcot-Marie-Tooth disease    walks with cane (fairly controlled)  . Diverticulosis   . DVT (deep venous thrombosis) ([place]) 2003   after sx  . GERD  (gastroesophageal reflux disease)   . Grief reaction    on fluoxetine  . Headache(784.0)    migraine  . Hiatal hernia   . Hyperlipidemia   . Hypertension   . OA (ocular albinism) ([place])   . Osteopenia   . PUD (peptic ulcer disease)    gastric, past  . Shingles   . Stroke ([place]) 8/10   posterior reversible encephalopathy syndrome  . Urine incontinence    Past Surgical History:  Procedure Laterality Date  . carotid dopplers  8/10  . COLONOSCOPY  2002  . HERNIA REPAIR  2002   bleeding  . HIATAL HERNIA REPAIR  2000   needed blood transfusion  . Stroke  8/10   hosp stroke/ posterior (vs posterior reversible encephalopathy syndrome) - d/c on Plavix  . TEE normal  [id number]   no emoblic source  . TOTAL KNEE ARTHROPLASTY  03,06   Bilateral  . VESICOVAGINAL FISTULA CLOSURE W/ TAH  1964   with bleeding and cervical dysplasia   Social History  Substance Use Topics  . Smoking status: Never Smoker  . Smokeless tobacco: Never Used  . Alcohol use No   Family History  Problem Relation Age of  Onset  . Cerebral aneurysm Son   . Cerebral aneurysm Father   . Stroke Mother   . Cervical cancer Sister   . Hyperlipidemia Father   . Alcohol abuse Father   . Hyperlipidemia Brother   . Alcohol abuse Brother   . Stroke Brother   . Diabetes Brother   . Diabetes Sister   . Sarcoidosis Sister   . Other Sister     OP/ Broken hip  . Other      whole family has charcot marie tooth  . Ovarian cancer Sister    Allergies  Allergen Reactions  . Amlodipine Besylate     REACTION: edema  . Atorvastatin     REACTION: pain- could not walk  . Ciprofloxacin     REACTION: unkown  . Codeine     REACTION: itchy rash  . Nitrofurantoin   . Omeprazole     REACTION: not effective  . Pantoprazole Sodium     REACTION: does not work well  . Penicillins     REACTION: itchy rash  . Prednisone     REACTION: RASH.// SHORTNESS OF BREATH  . Simvastatin     REACTION: leg pain  . Sulfonamide Derivatives       REACTION: itchy rash   Current Outpatient Prescriptions on File Prior to Visit  Medication Sig Dispense Refill  . acetaminophen (TYLENOL) 500 MG tablet Take 500 mg by mouth every 8 (eight) hours as needed.     Marland Kitchen albuterol (PROVENTIL HFA;VENTOLIN HFA) 108 (90 BASE) MCG/ACT inhaler Inhale 2 puffs into the lungs every 4 (four) hours as needed for wheezing or shortness of breath. 1 Inhaler 11  . Cholecalciferol (VITAMIN D) 2000 UNITS CAPS Take 2 capsules by mouth daily.      . clopidogrel (PLAVIX) 75 MG tablet Take 1 tablet (75 mg total) by mouth daily. 90 tablet 2  . CRESTOR 10 MG tablet TAKE 1 TABLET BY MOUTH AT BEDTIME 90 tablet 1  . esomeprazole (NEXIUM) 40 MG capsule Take 1 capsule (40 mg total) by mouth daily before breakfast. 90 capsule 2  . Flaxseed, Linseed, (FLAX SEED OIL) 1000 MG CAPS Take 1 capsule by mouth daily.      Marland Kitchen FLUoxetine (PROZAC) 20 MG capsule TAKE ONE CAPSULE BY MOUTH EVERY DAY 90 capsule 0  . losartan (COZAAR) 100 MG tablet TAKE 1/2 TAB BY MOUTH ONCE A DAY 45 tablet 1  . metFORMIN (GLUCOPHAGE) 500 MG tablet TAKE 1 TABLET BY MOUTH TWICE A DAY WITH MEALS 180 tablet 1  . Misc. Devices (WALKER) MISC Wheeled walker with seat- Pt must use for ambulation-- for use as directed: ambulation dx: 729.5, 780.79, 786.05, 784.0, 715.89.     Marland Kitchen omega-3 acid ethyl esters (LOVAZA) 1 g capsule Take 1 capsule (1 g total) by mouth daily. 90 capsule 2  . spironolactone (ALDACTONE) 50 MG tablet Take 1 tablet (50 mg total) by mouth daily. 90 tablet 2   No current facility-administered medications on file prior to visit.     Review of Systems Review of Systems  Constitutional: Negative for fever, appetite change, fatigue and unexpected weight change.  Eyes: Negative for pain and visual disturbance.  Respiratory: Negative for cough and shortness of breath.   Cardiovascular: Negative for cp or palpitations    Gastrointestinal: Negative for nausea, diarrhea and constipation.  Genitourinary:  Negative for urgency and frequency.  Skin: Negative for pallor or rash   MSK pos for low back pain rad to R leg as well  as R groin/hip pai n Neurological: Negative for weakness, light-headedness, numbness and headaches.  Hematological: Negative for adenopathy. Does not bruise/bleed easily.  Psychiatric/Behavioral: Negative for dysphoric mood. The patient is not nervous/anxious.         Objective:   Physical Exam  Constitutional: She appears well-developed and well-nourished. No distress.  overwt and frail appearing elderly female in wheelchair   HENT:  Head: Normocephalic and atraumatic.  Eyes: Conjunctivae and EOM are normal. Pupils are equal, round, and reactive to light. No scleral icterus.  Neck: Normal range of motion. Neck supple.  Cardiovascular: Normal rate and regular rhythm.   Pulmonary/Chest: Effort normal and breath sounds normal. She has no wheezes. She has no rales.  Abdominal: Soft. Bowel sounds are normal. She exhibits no distension. There is no tenderness.  Musculoskeletal: She exhibits tenderness.       Right shoulder: She exhibits decreased range of motion, tenderness, bony tenderness and spasm. She exhibits no crepitus and no deformity.       Lumbar back: She exhibits decreased range of motion, tenderness and spasm. She exhibits no bony tenderness and no edema.  Spasm/pain over R piriformis Tender over mid lumbar spine Neg bent knee raise Pain to ext rotate R hip  Gait is labored with walker   No neuro changes   Lymphadenopathy:    She has no cervical adenopathy.  Neurological: She is alert. She has normal strength and normal reflexes. She displays no atrophy. No cranial nerve deficit or sensory deficit. She exhibits normal muscle tone. Coordination normal.  Negative SLR  Skin: Skin is warm and dry. No rash noted. No erythema. No pallor.  Psychiatric: She has a normal mood and affect.          Assessment & Plan:   Problem List Items Addressed This Visit       Other   Right hip pain    Pt with Marie-Charcot joints c/o R groin pain along with her sciatica Xray of hip today      Relevant Orders   DG HIP UNILAT WITH PELVIS 2-3 VIEWS RIGHT (Completed)   Low back pain with sciatica - Primary    Acute on chronic Not helped by muscle relaxer Pt is elderly with Charcot-Marie dz as well  xr of LS  Trial of prednisone taper  Avoiding other nsaid due to hx of PUD  Will go from there and rev rad reading      Relevant Medications   metaxalone (SKELAXIN) 800 MG tablet   predniSONE (DELTASONE) 10 MG tablet   Other Relevant Orders   DG Lumbar Spine Complete (Completed)    Other Visit Diagnoses    Low back pain, unspecified back pain laterality, with sciatica presence unspecified       Relevant Medications   metaxalone (SKELAXIN) 800 MG tablet   predniSONE (DELTASONE) 10 MG tablet   Other Relevant Orders   POCT Urinalysis Dipstick (Automated) (Completed)

## 2015-10-02 NOTE — Progress Notes (Signed)
Pre visit review using our clinic review tool, if applicable. No additional management support is needed unless otherwise documented below in the visit note. 

## 2015-10-02 NOTE — Telephone Encounter (Signed)
PLEASE NOTE: All timestamps contained within this report are represented as [place] Standard Time. CONFIDENTIALTY NOTICE: This fax transmission is intended only for the addressee. It contains information that is legally privileged, confidential or otherwise protected from use or disclosure. If you are not the intended recipient, you are strictly prohibited from reviewing, disclosing, copying using or disseminating any of this information or taking any action in reliance on or regarding this information. If you have received this fax in error, please notify us immediately by telephone so that we can arrange for its return to [place]. Phone: [phone], Toll-Free: [phone], Fax: [phone] Page: 1 of 2 Call Id: [id number] [place] Patient Name: Lydia King Gender: Female DOB: 06/27/1930 Age: 82 Y 54 M 11 D Return Phone Number: [phone] (Primary), [phone] (Secondary) Address: City/State/Zip: [place] Client [place] [facility] - Client Client Site [place] MD Contact Type Call Who Is Calling Patient / Member / Family / Caregiver Call Type Triage / Clinical Caller Name [name] Relationship To Patient Daughter Return Phone Number [phone] (Primary) Chief Complaint Back Pain - General Reason for Call Symptomatic / Request for [name] states her mother has severe back pain. She was seen Saturday and the muscle relaxers are not helping. Appointment Disposition EMR Appointment Not Necessary Info pasted into Epic No PreDisposition Did not know what to do Translation No Nurse Assessment Nurse: Germain Osgood, RN, Opal Sidles Date/Time Eilene Ghazi Time): 10/01/2015 11:35:23 AM Confirm and document reason for call. If symptomatic, describe symptoms. You must click the next button to save  text entered. ---Caller states her mother has not had improvement in back pain on Metaxalone. Pain worsens with movement. Charcot-Marie Did give 1 ibuprofen 3 x yesterday and pain did improve. Was able to be up. Is taking only 1/2 of the metaxalone. Was advised to stop Ibuprofen, Arthrotec, and Tramadol after her stroke. Has the patient traveled out of the country within the last 30 days? ---No Does the patient have any new or worsening symptoms? ---Yes Will a triage be completed? ---Yes Related visit to physician within the last 2 weeks? ---Yes Does the PT have any chronic conditions? (i.e. diabetes, asthma, etc.) ---Yes List chronic conditions. ---Stroke and Charcot-Marie Is this a behavioral health or substance abuse call? ---No Guidelines Guideline Title Affirmed Question Affirmed Notes Nurse Date/Time (Eastern Time) Back Pain High-risk adult (e.g., history of cancer, HIV, or IV drug abuse) Rowan Blase 10/01/2015 11:38:27 AM PLEASE NOTE: All timestamps contained within this report are represented as [place] Standard Time. CONFIDENTIALTY NOTICE: This fax transmission is intended only for the addressee. It contains information that is legally privileged, confidential or otherwise protected from use or disclosure. If you are not the intended recipient, you are strictly prohibited from reviewing, disclosing, copying using or disseminating any of this information or taking any action in reliance on or regarding this information. If you have received this fax in error, please notify us immediately by telephone so that we can arrange for its return to [place]. Phone: [phone], Toll-Free: [phone], Fax: [phone] Page: 2 of 2 Call Id: [id number] [place]. Time Eilene Ghazi Time) Disposition Final User 10/01/2015 11:53:44 AM Called On-Call Provider Germain Osgood, RN, Jane 10/01/2015 11:57:59 AM Call Completed Germain Osgood, RN, Jane 10/01/2015 11:46:11 AM See Physician within 24 Hours Yes Germain Osgood, RN,  Irving Copas Understands: Yes Disagree/Comply: Comply Care Advice Given Per Guideline  SEE PHYSICIAN WITHIN 24 HOURS: CALL BACK IF: * You become worse. CARE ADVICE given per Back Pain (Adult) guideline. Comments User: Susanne Greenhouse, RN Date/Time Eilene Ghazi Time): 10/01/2015 11:57:49 AM Caller informed of MD instructions to give Tylenol 1000 mg po TID around clock ( 8 hours) and follow Up with PCP in A.M Caller verbalized understanding Referrals REFERRED TO PCP OFFICE Paging DoctorName Phone DateTime Result/Outcome Message Type Notes Thersa Salt - DO [id number] 10/01/2015 11:53:44 AM Called On Call Provider - Reached Doctor Paged Thersa Salt - DO 10/01/2015 11:54:53 AM Spoke with On Call - General Message Result Dr. Lacinda Axon informed of caller's report. Recommends Tylenol 1000 mg around the clock Call PCP tomorrow and update. Do not give Ibuprofen.

## 2015-10-02 NOTE — Assessment & Plan Note (Signed)
Acute on chronic Not helped by muscle relaxer Pt is elderly with Charcot-Marie dz as well  xr of LS  Trial of prednisone taper  Avoiding other nsaid due to hx of PUD  Will go from there and rev rad reading

## 2015-10-02 NOTE — Assessment & Plan Note (Signed)
Pt with Lydia King joints c/o R groin pain along with her sciatica Xray of hip today

## 2015-10-02 NOTE — Patient Instructions (Signed)
Start prednisone taper - take with food  Xray now  Plan to follow

## 2015-10-12 ENCOUNTER — Encounter: Payer: Self-pay | Admitting: Family Medicine

## 2015-10-14 ENCOUNTER — Encounter: Payer: Self-pay | Admitting: Family Medicine

## 2015-10-14 DIAGNOSIS — M5441 Lumbago with sciatica, right side: Secondary | ICD-10-CM

## 2015-10-14 MED ORDER — METAXALONE 800 MG PO TABS: 400.0000 mg | ORAL_TABLET | Freq: Three times a day (TID) | ORAL | 1 refills | Status: DC | PRN

## 2015-10-14 NOTE — Telephone Encounter (Signed)
Refill of skelaxin

## 2015-10-26 ENCOUNTER — Encounter: Payer: Self-pay | Admitting: Family Medicine

## 2015-10-29 ENCOUNTER — Other Ambulatory Visit: Payer: Self-pay | Admitting: Family Medicine

## 2015-10-29 NOTE — Telephone Encounter (Signed)
No recent/future f/u or CPE appts., no recent labs, pt did have a recent acute appt,  please advise

## 2015-10-29 NOTE — Telephone Encounter (Signed)
She is due for an A1C   when she can do it Thanks  Will refill electronically

## 2015-10-30 ENCOUNTER — Telehealth: Payer: Self-pay | Admitting: Family Medicine

## 2015-10-30 ENCOUNTER — Other Ambulatory Visit: Payer: Self-pay | Admitting: Family Medicine

## 2015-10-30 MED ORDER — CYCLOBENZAPRINE HCL 10 MG PO TABS
5.0000 mg | ORAL_TABLET | Freq: Three times a day (TID) | ORAL | 1 refills | Status: DC | PRN
Start: 2015-10-30 — End: 2016-04-20

## 2015-10-30 NOTE — Telephone Encounter (Signed)
Family notified

## 2015-10-30 NOTE — Telephone Encounter (Signed)
done

## 2015-10-30 NOTE — Telephone Encounter (Signed)
No recent/future appt., last filled on 07/30/15 #90 caps with 0 refills, please advise

## 2015-10-30 NOTE — Telephone Encounter (Signed)
Please let family know I sent cyclobenzaprine to [facility] Caution of sedation or dizziness   Let me know how she does off metformin after a week or so

## 2015-10-30 NOTE — Telephone Encounter (Signed)
Please refill for a year  

## 2015-10-30 NOTE — Telephone Encounter (Signed)
Daughter notified of Dr. Marliss Coots comments and that Rx was sent to pharmacy and advise of the sedation caution

## 2015-11-16 ENCOUNTER — Telehealth: Payer: Self-pay | Admitting: Family Medicine

## 2015-11-16 NOTE — Telephone Encounter (Signed)
Form is in your in box

## 2015-11-16 NOTE — Telephone Encounter (Signed)
Done and in IN box 

## 2015-11-16 NOTE — Telephone Encounter (Signed)
[name] dropped off handicap placard and adult proxy information to send to HIM.  ppw in prescription tower. Best number to call is [phone] when form completed.

## 2015-11-19 NOTE — Telephone Encounter (Signed)
Paperwork placed in yellow folder ready for pick up, left detailed message.

## 2015-11-20 DIAGNOSIS — M545 Low back pain: Secondary | ICD-10-CM | POA: Diagnosis not present

## 2015-11-20 DIAGNOSIS — M5442 Lumbago with sciatica, left side: Secondary | ICD-10-CM | POA: Diagnosis not present

## 2015-11-20 DIAGNOSIS — M5441 Lumbago with sciatica, right side: Secondary | ICD-10-CM | POA: Diagnosis not present

## 2015-12-07 ENCOUNTER — Emergency Department: Payer: Medicare Other

## 2015-12-07 ENCOUNTER — Telehealth: Payer: Self-pay | Admitting: Family Medicine

## 2015-12-07 ENCOUNTER — Ambulatory Visit: Payer: Self-pay | Admitting: Family Medicine

## 2015-12-07 ENCOUNTER — Encounter: Payer: Self-pay | Admitting: Emergency Medicine

## 2015-12-07 ENCOUNTER — Emergency Department
Admission: EM | Admit: 2015-12-07 | Discharge: 2015-12-07 | Disposition: A | Discharge status: Home / Self Care | Payer: Medicare Other | Attending: Emergency Medicine | Admitting: Emergency Medicine

## 2015-12-07 DIAGNOSIS — W01190A Fall on same level from slipping, tripping and stumbling with subsequent striking against furniture, initial encounter: Secondary | ICD-10-CM | POA: Diagnosis not present

## 2015-12-07 DIAGNOSIS — E119 Type 2 diabetes mellitus without complications: Secondary | ICD-10-CM | POA: Insufficient documentation

## 2015-12-07 DIAGNOSIS — S0003XA Contusion of scalp, initial encounter: Secondary | ICD-10-CM | POA: Diagnosis not present

## 2015-12-07 DIAGNOSIS — Y9389 Activity, other specified: Secondary | ICD-10-CM | POA: Insufficient documentation

## 2015-12-07 DIAGNOSIS — Y999 Unspecified external cause status: Secondary | ICD-10-CM | POA: Insufficient documentation

## 2015-12-07 DIAGNOSIS — I10 Essential (primary) hypertension: Secondary | ICD-10-CM | POA: Insufficient documentation

## 2015-12-07 DIAGNOSIS — Z7984 Long term (current) use of oral hypoglycemic drugs: Secondary | ICD-10-CM | POA: Diagnosis not present

## 2015-12-07 DIAGNOSIS — M542 Cervicalgia: Secondary | ICD-10-CM | POA: Diagnosis not present

## 2015-12-07 DIAGNOSIS — M25512 Pain in left shoulder: Secondary | ICD-10-CM

## 2015-12-07 DIAGNOSIS — Y929 Unspecified place or not applicable: Secondary | ICD-10-CM | POA: Insufficient documentation

## 2015-12-07 DIAGNOSIS — J45909 Unspecified asthma, uncomplicated: Secondary | ICD-10-CM | POA: Diagnosis not present

## 2015-12-07 DIAGNOSIS — W19XXXA Unspecified fall, initial encounter: Secondary | ICD-10-CM

## 2015-12-07 DIAGNOSIS — S0990XA Unspecified injury of head, initial encounter: Secondary | ICD-10-CM | POA: Diagnosis present

## 2015-12-07 DIAGNOSIS — Z79899 Other long term (current) drug therapy: Secondary | ICD-10-CM | POA: Insufficient documentation

## 2015-12-07 DIAGNOSIS — S199XXA Unspecified injury of neck, initial encounter: Secondary | ICD-10-CM | POA: Diagnosis not present

## 2015-12-07 MED ORDER — ACETAMINOPHEN 500 MG PO TABS
1000.0000 mg | ORAL_TABLET | Freq: Once | ORAL | Status: AC
  Administered 2015-12-07: 1000 mg via ORAL
  Filled 2015-12-07: qty 2

## 2015-12-07 NOTE — Telephone Encounter (Signed)
Patient Name: Lydia King DOB: [birth year] Initial Comment Caller states her mother fell this morning. She was getting out of the bed, and off balanced. She hit her head. Bump on her head. No symptoms. Nurse Assessment Nurse: Ronnald Ramp, RN, Miranda Date/Time (Eastern Time): 12/07/2015 8:16:13 AM Confirm and document reason for call. If symptomatic, describe symptoms. You must click the next button to save text entered. ---Caller states as her mother got out of bed this morning, she lost her balance and fell. She hit the back of her head on a piece of exercise equipment. She has a bump on the back of her head that is tender, but no other symptoms. Has the patient traveled out of the country within the last 30 days? ---Not Applicable Does the patient have any new or worsening symptoms? ---Yes Will a triage be completed? ---Yes Related visit to physician within the last 2 weeks? ---No Does the PT have any chronic conditions? (i.e. diabetes, asthma, etc.) ---Yes List chronic conditions. ---Hx of strokes, back pain, HTN Is this a behavioral health or substance abuse call? ---No Guidelines Guideline Title Affirmed Question Affirmed Notes Head Injury Taking Coumadin (warfarin) or other strong blood thinner, or known bleeding disorder (e.g., thrombocytopenia) Final Disposition User Go to ED Now (or PCP triage) Ronnald Ramp, RN, [facility] - ED Disagree/Comply: Comply

## 2015-12-07 NOTE — ED Notes (Signed)
Ice pack to raised area on back of head.  Alert and oriented.  Color good.  Skin warm and dry.

## 2015-12-07 NOTE — Discharge Instructions (Signed)
Follow-up with your doctor in 2 days. Return to the emergency room if you have severe headache or any symptoms of stroke including facial droop, slurred speech, difficulty finding words, weakness or numbness in one side of your body.

## 2015-12-07 NOTE — ED Provider Notes (Signed)
[facility] Emergency Department Provider Note  ____________________________________________  Time seen: Approximately 11:08 AM  I have reviewed the triage vital signs and the nursing notes.   HISTORY  Chief Complaint Fall   HPI Lydia King is a 82 y.o. female with a history of stroke on Plavix, hypertension, hyperlipidemia, Charcot-Marie-Tooth disease who presents for evaluation of head trauma status post mechanical fall. Patient reports that she woke up this morning was trying to get out of bed and she tripped and fell backwards. She hit her head onto the leg of a wood nightstand. She denies LOC. Patient denies severe headache, chest pain, back pain, shortness of breath, palpitations, abdominal pain leading to her fall. She was unable to get up and called her daughter who came in the room and assisted her. She was able to ambulate. Per daughter she had no signs or symptoms of stroke, she was alert and oriented, no urinary or bowel incontinence, she was back to her baseline with no facial droop no unilateral weakness or numbness. Patient is complaining of pain on the top of her head where she hit it on the table that has been present since the fall, constant, nonradiating. She is also complaining of mild pain in her left shoulder. She reports chronic pain in the shoulder however that has been exacerbated after the fall. She has normal movement of her left arm.  Past Medical History:  Diagnosis Date  . Anemia    in past (from blood loss at hiatal hernia)  . Asthma   . Charcot-Marie-Tooth disease    walks with cane (fairly controlled)  . Diverticulosis   . DVT (deep venous thrombosis) ([place]) 2003   after sx  . GERD (gastroesophageal reflux disease)   . Grief reaction    on fluoxetine  . Headache(784.0)    migraine  . Hiatal hernia   . Hyperlipidemia   . Hypertension   . OA (ocular albinism) ([place])   . Osteopenia   . PUD (peptic ulcer disease)    gastric, past  . Shingles   . Stroke ([place]) 8/10   posterior reversible encephalopathy syndrome  . Urine incontinence     Patient Active Problem List   Diagnosis Date Noted  . Low back pain with sciatica 10/02/2015  . Right hip pain 10/02/2015  . Hearing loss 04/18/2015  . Encounter for Medicare annual wellness exam 10/04/2013  . DM type 2 (diabetes mellitus, type 2) ([place]) 08/31/2012  . CVA 09/12/2008  . OSTEOARTHRITIS, MULTIPLE JOINTS 05/30/2008  . OSTEOPENIA 05/30/2008  . Vitamin D deficiency 07/14/2007  . SLEEP APNEA 07/14/2007  . Hyperlipidemia 05/26/2007  . Essential hypertension 11/30/2006  . Asthma 11/30/2006  . H/O gastroesophageal reflux (GERD) 11/30/2006  . G I BLEED 11/30/2006  . DEEP VENOUS THROMBOPHLEBITIS, HX OF 11/30/2006    Past Surgical History:  Procedure Laterality Date  . carotid dopplers  8/10  . COLONOSCOPY  2002  . HERNIA REPAIR  2002   bleeding  . HIATAL HERNIA REPAIR  2000   needed blood transfusion  . Stroke  8/10   hosp stroke/ posterior (vs posterior reversible encephalopathy syndrome) - d/c on Plavix  . TEE normal  [id number]   no emoblic source  . TOTAL KNEE ARTHROPLASTY  03,06   Bilateral  . VESICOVAGINAL FISTULA CLOSURE W/ TAH  1964   with bleeding and cervical dysplasia    Prior to Admission medications   Medication Sig Start Date End Date Taking? Authorizing Provider  acetaminophen (  TYLENOL) 500 MG tablet Take 500 mg by mouth every 8 (eight) hours as needed.     Historical Provider, MD  albuterol (PROVENTIL HFA;VENTOLIN HFA) 108 (90 BASE) MCG/ACT inhaler Inhale 2 puffs into the lungs every 4 (four) hours as needed for wheezing or shortness of breath. 07/12/14   Abner Greenspan, MD  Cholecalciferol (VITAMIN D) 2000 UNITS CAPS Take 2 capsules by mouth daily.      Historical Provider, MD  clopidogrel (PLAVIX) 75 MG tablet Take 1 tablet (75 mg total) by mouth daily. 06/27/15   Abner Greenspan, MD  CRESTOR 10 MG tablet TAKE 1 TABLET BY MOUTH AT  BEDTIME 07/16/15   Abner Greenspan, MD  cyclobenzaprine (FLEXERIL) 10 MG tablet Take 0.5-1 tablets (5-10 mg total) by mouth 3 (three) times daily as needed for muscle spasms. Careful of sedation and falls 10/30/15   Abner Greenspan, MD  esomeprazole (NEXIUM) 40 MG capsule Take 1 capsule (40 mg total) by mouth daily before breakfast. 06/27/15   Abner Greenspan, MD  Flaxseed, Linseed, (FLAX SEED OIL) 1000 MG CAPS Take 1 capsule by mouth daily.      Historical Provider, MD  FLUoxetine (PROZAC) 20 MG capsule TAKE ONE CAPSULE BY MOUTH EVERY DAY 10/30/15   Abner Greenspan, MD  losartan (COZAAR) 100 MG tablet TAKE 1/2 TAB BY MOUTH ONCE A DAY 07/09/15   Abner Greenspan, MD  metFORMIN (GLUCOPHAGE) 500 MG tablet TAKE 1 TABLET BY MOUTH TWICE A DAY WITH MEALS 10/29/15   Abner Greenspan, MD  Misc. Devices (WALKER) MISC Wheeled walker with seat- Pt must use for ambulation-- for use as directed: ambulation dx: 729.5, 780.79, 786.05, 784.0, 715.89.     Historical Provider, MD  omega-3 acid ethyl esters (LOVAZA) 1 g capsule Take 1 capsule (1 g total) by mouth daily. 06/27/15   Abner Greenspan, MD  predniSONE (DELTASONE) 10 MG tablet Take 3 pills once daily by mouth for 3 days, then 2 pills once daily for 3 days, then 1 pill once daily for 3 days and then stop 10/02/15   Abner Greenspan, MD  spironolactone (ALDACTONE) 50 MG tablet Take 1 tablet (50 mg total) by mouth daily. 06/27/15   Abner Greenspan, MD    Allergies Amlodipine besylate; Atorvastatin; Ciprofloxacin; Codeine; Nitrofurantoin; Omeprazole; Pantoprazole sodium; Penicillins; Prednisone; Simvastatin; and Sulfonamide derivatives  Family History  Problem Relation Age of Onset  . Cerebral aneurysm Son   . Cerebral aneurysm Father   . Stroke Mother   . Cervical cancer Sister   . Hyperlipidemia Father   . Alcohol abuse Father   . Hyperlipidemia Brother   . Alcohol abuse Brother   . Stroke Brother   . Diabetes Brother   . Diabetes Sister   . Sarcoidosis Sister   . Other Sister      OP/ Broken hip  . Other      whole family has charcot marie tooth  . Ovarian cancer Sister     Social History Social History  Substance Use Topics  . Smoking status: Never Smoker  . Smokeless tobacco: Never Used  . Alcohol use No    Review of Systems Constitutional: Negative for fever. Eyes: Negative for visual changes. ENT: Negative for facial injury or neck injury Cardiovascular: Negative for chest injury. Respiratory: Negative for shortness of breath. Negative for chest wall injury. Gastrointestinal: Negative for abdominal pain or injury. Genitourinary: Negative for dysuria. Musculoskeletal: Negative for back injury. + Left shoudler pain Skin:  Negative for laceration/abrasions. Neurological: + head injury.   ____________________________________________   PHYSICAL EXAM:  VITAL SIGNS: ED Triage Vitals [12/07/15 0912]  Enc Vitals Group     BP (!) 154/60     Pulse Rate 78     Resp 18     Temp 98 F (36.7 C)     Temp Source Oral     SpO2 98 %     Weight 180 lb (81.6 kg)     Height 5' (1.524 m)     Head Circumference      Peak Flow      Pain Score 6     Pain Loc      Pain Edu?      Excl. in [place]?     Constitutional: Alert and oriented. No acute distress. Does not appear intoxicated. HEENT Head: Normocephalic and scalp hematoma on the R parietal region Face: No facial bony tenderness. Stable midface Ears: No hemotympanum bilaterally. No Battle sign Eyes: No eye injury. PERRL. No raccoon eyes Nose: Nontender. No epistaxis. No rhinorrhea Mouth/Throat: Mucous membranes are moist. No oropharyngeal blood. No dental injury. Airway patent without stridor. Normal voice. Neck: no C-collar in place. No midline c-spine tenderness.  Cardiovascular: Normal rate, regular rhythm. Normal and symmetric distal pulses are present in all extremities. Pulmonary/Chest: Chest wall is stable and nontender to palpation/compression. Normal respiratory effort. Breath sounds are  normal. No crepitus.  Abdominal: Soft, nontender, non distended. Musculoskeletal: Nontender with normal full range of motion in all extremities. No deformities. No thoracic or lumbar midline spinal tenderness. Pelvis is stable. Skin: Skin is warm, dry and intact. No abrasions or contutions. Psychiatric: Speech and behavior are appropriate. Neurological: Normal speech and language. Moves all extremities to command. No gross focal neurologic deficits are appreciated.  Glascow Coma Score: 4 - Opens eyes on own 6 - Follows simple motor commands 5 - Alert and oriented GCS: 15   ____________________________________________   LABS (all labs ordered are listed, but only abnormal results are displayed)  Labs Reviewed - No data to display ____________________________________________  EKG  none  ____________________________________________  RADIOLOGY  CT head and c-spine: Negative  XR left shoulder: No acute injury ____________________________________________   PROCEDURES  Procedure(s) performed: None Procedures Critical Care performed:  None ____________________________________________   INITIAL IMPRESSION / ASSESSMENT AND PLAN / ED COURSE   82 y.o. female with a history of stroke on Plavix, hypertension, hyperlipidemia, Charcot-Marie-Tooth disease who presents for evaluation of head trauma status post mechanical fall. Patient is neurologically intact. No signs or symptoms of basilar skull fracture. The patient has a scalp hematoma but no other acute findings on exam. Head CT is negative for acute bleed. CT cervical spine and x-ray of the shoulder have been ordered. We'll give Tylenol for the headache. I have discussed concerns for delayed bleed on Plavix. Patient lives with her daughter who will observe her very closely over the next few days.  Clinical Course as of Dec 07 1311  Fri Dec 07, 2015  1310 Imaging negative for acute findings. Will dc home with strict return  precautions for signs or symptoms of delayed bleed.  [CV]    Clinical Course User Index [CV] Rudene Re, MD    Pertinent labs & imaging results that were available during my care of the patient were reviewed by me and considered in my medical decision making (see chart for details).    ____________________________________________   FINAL CLINICAL IMPRESSION(S) / ED DIAGNOSES  Final diagnoses:  Fall,  initial encounter  Hematoma of scalp, initial encounter  Left shoulder pain, unspecified chronicity      NEW MEDICATIONS STARTED DURING THIS VISIT:  New Prescriptions   No medications on file     Note:  This document was prepared using Dragon voice recognition software and may include unintentional dictation errors.    Rudene Re, MD 12/07/15 1314

## 2015-12-07 NOTE — ED Triage Notes (Signed)
Pt reports she stood up this morning from bed and fell backwards hitting her head; no loc; on plavix

## 2015-12-07 NOTE — ED Notes (Signed)
Pt ambulated to the commode with assistance from RN without difficulty

## 2015-12-10 ENCOUNTER — Telehealth: Payer: Self-pay

## 2015-12-10 NOTE — Telephone Encounter (Signed)
Madaline Savage POA said pt was seen at [facility] ED after fall; scan was normal; pt has had no h/a; no slurred speech, no difficulty finding words, no weakness or numbness on either side. Today pt does have diarrhea and wants to schedule ED f/u for another day. Pt scheduled ED 30 min f/u on 12/11/15 at 3:45 with Dr Glori Bickers; it pt condition changes or worsens prior to appt pt will go back to ED. FYI to Dr Glori Bickers.

## 2015-12-11 ENCOUNTER — Encounter: Payer: Self-pay | Admitting: Family Medicine

## 2015-12-11 ENCOUNTER — Ambulatory Visit (INDEPENDENT_AMBULATORY_CARE_PROVIDER_SITE_OTHER): Payer: Medicare Other | Admitting: Family Medicine

## 2015-12-11 VITALS — BP 124/76 | HR 78 | Temp 98.0°F | Ht <= 58 in | Wt 180.5 lb

## 2015-12-11 DIAGNOSIS — S0990XD Unspecified injury of head, subsequent encounter: Secondary | ICD-10-CM | POA: Diagnosis not present

## 2015-12-11 DIAGNOSIS — Y92009 Unspecified place in unspecified non-institutional (private) residence as the place of occurrence of the external cause: Principal | ICD-10-CM

## 2015-12-11 DIAGNOSIS — S0990XA Unspecified injury of head, initial encounter: Secondary | ICD-10-CM | POA: Insufficient documentation

## 2015-12-11 DIAGNOSIS — W19XXXA Unspecified fall, initial encounter: Secondary | ICD-10-CM | POA: Insufficient documentation

## 2015-12-11 DIAGNOSIS — Y92099 Unspecified place in other non-institutional residence as the place of occurrence of the external cause: Secondary | ICD-10-CM | POA: Diagnosis not present

## 2015-12-11 DIAGNOSIS — E119 Type 2 diabetes mellitus without complications: Secondary | ICD-10-CM

## 2015-12-11 DIAGNOSIS — R2689 Other abnormalities of gait and mobility: Secondary | ICD-10-CM | POA: Diagnosis not present

## 2015-12-11 DIAGNOSIS — W19XXXD Unspecified fall, subsequent encounter: Secondary | ICD-10-CM | POA: Diagnosis not present

## 2015-12-11 NOTE — Progress Notes (Signed)
Subjective:    Patient ID: Lydia King, female    DOB: [birth year], 82 y.o.   MRN: [id number]  HPI Here for f/u of ED visit from 12/07/15 at [facility] Pt presented after a fall States she woke up and tried to get out of bed/tripped/fell backwards and hit her head on the leg of a piano leg   Denies LOC  Denies other pain or s/s of stroke  Was on plavix for hx of cva   Ct Head Wo Contrast  Result Date: 12/07/2015 CLINICAL DATA:  Status post fall from bed. EXAM: CT HEAD WITHOUT CONTRAST TECHNIQUE: Contiguous axial images were obtained from the base of the skull through the vertex without intravenous contrast. COMPARISON:  10/01/2008 FINDINGS: Brain: No evidence of acute infarction, hemorrhage, extra-axial collection, ventriculomegaly, or mass effect. Bilateral old cerebellar infarcts. Old bilateral occipital lobe infarct. Generalized cerebral atrophy. Periventricular white matter low attenuation likely secondary to microangiopathy. Vascular: Cerebrovascular atherosclerotic calcifications are noted. Skull: Negative for fracture or focal lesion. Sinuses/Orbits: Visualized portions of the orbits are unremarkable. Visualized portions of the paranasal sinuses and mastoid air cells are unremarkable. Other: None. IMPRESSION: No acute intracranial pathology. Electronically Signed   By: Kathreen Devoid   On: 12/07/2015 10:40   Ct Cervical Spine Wo Contrast  Result Date: 12/07/2015 CLINICAL DATA:  Neck pain after fall this morning. EXAM: CT CERVICAL SPINE WITHOUT CONTRAST TECHNIQUE: Multidetector CT imaging of the cervical spine was performed without intravenous contrast. Multiplanar CT image reconstructions were also generated. COMPARISON:  12/28/2008 cervical spine radiographs. FINDINGS: Alignment: Straightening of the cervical spine. Minimal 2 mm anterolisthesis at C3-4 and minimal 2 mm anterolisthesis at C7-T1, not definitely changed. No acute subluxation. Dens is well positioned between the lateral masses  of C1. Skull base and vertebrae: No acute fracture. No primary bone lesion or focal pathologic process. Soft tissues and spinal canal: No prevertebral fluid or swelling. No visible canal hematoma. Stable chronic left inferior occipital infarct. Disc levels: Marked degenerative disc disease throughout the cervical spine, most prominent at C4-5, C5-6 and C6-7. Moderate bilateral facet arthropathy. No significant degenerative foraminal stenosis. Upper chest: Negative. Other: Visualized mastoid air cells appear clear. No discrete thyroid nodules. No pathologically enlarged cervical nodes. IMPRESSION: 1. No fracture or acute malalignment in the cervical spine. 2. Marked degenerative changes in the cervical spine as described. Electronically Signed   By: Ilona Sorrel M.D.   On: 12/07/2015 13:00   Dg Shoulder Left  Result Date: 12/07/2015 CLINICAL DATA:  Left shoulder pain. EXAM: LEFT SHOULDER - 2+ VIEW COMPARISON:  None. FINDINGS: There is no evidence of fracture or dislocation. Severe narrowing of the glenohumeral joint is noted consistent with degenerative joint disease. Soft tissues are unremarkable. IMPRESSION: Severe degenerative joint disease of the left glenohumeral joint. No acute abnormality seen in the left shoulder. Electronically Signed   By: Marijo Conception, M.D.   On: 12/07/2015 11:29    CT head-normal  CT neck- old deg changes/ no fx or acute findings L shoulder xr- severe deg joint dz / no fracture (has had problems with it for the past 3 months)   Did take one tylenol- did not need much more than that  No headache  Had a little nausea after a virus - better now (diarrhea) No dizziness  No personality change  Daughter is keeping an eye on her and she does not stay at night by herself   On exam she had a scalp hematoma   BP  Readings from Last 3 Encounters:  12/11/15 124/76  12/07/15 (!) 152/119  10/02/15 118/64    Pulse Readings from Last 3 Encounters:  12/11/15 78  12/07/15 71    10/02/15 77      Chemistry      Component Value Date/Time   NA 135 04/16/2015 1301   K 4.4 04/16/2015 1301   CL 101 04/16/2015 1301   CO2 27 04/16/2015 1301   BUN 26 (H) 04/16/2015 1301   CREATININE 0.77 04/16/2015 1301      Component Value Date/Time   CALCIUM 9.5 04/16/2015 1301   ALKPHOS 52 04/16/2015 1301   AST 15 04/16/2015 1301   ALT 18 04/16/2015 1301   BILITOT 0.4 04/16/2015 1301      Fall prevention- aware that she went backwards when she stood up  No hx of orthostasis  Just lost balance  Has cut back on coffee and drinking more water   This is the 3rd fall this year   Wt Readings from Last 3 Encounters:  12/11/15 180 lb 8 oz (81.9 kg)  12/07/15 180 lb (81.6 kg)  10/02/15 182 lb (82.6 kg)   Last A1C was 7.1  Is watching her diet/sugar  On prednisone -it makes her crave sugar  Did cut out diet drinks completely  She is drinking water  Not getting too much exercise   Has made an effort to reduce sugar and carbs before the prednisone  She is not taking metformin - it was making her muscles hurt and she feels better off of it   Patient Active Problem List   Diagnosis Date Noted  . Fall in home 12/11/2015  . Poor balance 12/11/2015  . Closed head injury 12/11/2015  . Low back pain with sciatica 10/02/2015  . Right hip pain 10/02/2015  . Hearing loss 04/18/2015  . Encounter for Medicare annual wellness exam 10/04/2013  . DM type 2 (diabetes mellitus, type 2) ([place]) 08/31/2012  . CVA 09/12/2008  . OSTEOARTHRITIS, MULTIPLE JOINTS 05/30/2008  . OSTEOPENIA 05/30/2008  . Vitamin D deficiency 07/14/2007  . SLEEP APNEA 07/14/2007  . Hyperlipidemia 05/26/2007  . Essential hypertension 11/30/2006  . Asthma 11/30/2006  . H/O gastroesophageal reflux (GERD) 11/30/2006  . G I BLEED 11/30/2006  . DEEP VENOUS THROMBOPHLEBITIS, HX OF 11/30/2006   Past Medical History:  Diagnosis Date  . Anemia    in past (from blood loss at hiatal hernia)  . Asthma   .  Charcot-Marie-Tooth disease    walks with cane (fairly controlled)  . Diverticulosis   . DVT (deep venous thrombosis) ([place]) 2003   after sx  . GERD (gastroesophageal reflux disease)   . Grief reaction    on fluoxetine  . Headache(784.0)    migraine  . Hiatal hernia   . Hyperlipidemia   . Hypertension   . OA (ocular albinism) ([place])   . Osteopenia   . PUD (peptic ulcer disease)    gastric, past  . Shingles   . Stroke ([place]) 8/10   posterior reversible encephalopathy syndrome  . Urine incontinence    Past Surgical History:  Procedure Laterality Date  . carotid dopplers  8/10  . COLONOSCOPY  2002  . HERNIA REPAIR  2002   bleeding  . HIATAL HERNIA REPAIR  2000   needed blood transfusion  . Stroke  8/10   hosp stroke/ posterior (vs posterior reversible encephalopathy syndrome) - d/c on Plavix  . TEE normal  [id number]   no emoblic source  . TOTAL  KNEE ARTHROPLASTY  03,06   Bilateral  . VESICOVAGINAL FISTULA CLOSURE W/ TAH  1964   with bleeding and cervical dysplasia   Social History  Substance Use Topics  . Smoking status: Never Smoker  . Smokeless tobacco: Never Used  . Alcohol use No   Family History  Problem Relation Age of Onset  . Cerebral aneurysm Son   . Cerebral aneurysm Father   . Stroke Mother   . Cervical cancer Sister   . Hyperlipidemia Father   . Alcohol abuse Father   . Hyperlipidemia Brother   . Alcohol abuse Brother   . Stroke Brother   . Diabetes Brother   . Diabetes Sister   . Sarcoidosis Sister   . Other Sister     OP/ Broken hip  . Other      whole family has charcot marie tooth  . Ovarian cancer Sister    Allergies  Allergen Reactions  . Shrimp [Shellfish Allergy] Anaphylaxis    Anaphylaxis-swelling of throat/sob    . Amlodipine Besylate     REACTION: edema  . Atorvastatin     REACTION: pain- could not walk  . Ciprofloxacin     REACTION: unkown  . Codeine     REACTION: itchy rash  . Influenza Vaccines Other (See Comments)    Arm  swelling, redness, chills, fever  . Nitrofurantoin   . Omeprazole     REACTION: not effective  . Pantoprazole Sodium     REACTION: does not work well  . Penicillins     REACTION: itchy rash  . Prednisone     REACTION: RASH.// SHORTNESS OF BREATH  . Simvastatin     REACTION: leg pain  . Sulfonamide Derivatives     REACTION: itchy rash   Current Outpatient Prescriptions on File Prior to Visit  Medication Sig Dispense Refill  . acetaminophen (TYLENOL) 500 MG tablet Take 500 mg by mouth every 8 (eight) hours as needed.     Marland Kitchen albuterol (PROVENTIL HFA;VENTOLIN HFA) 108 (90 BASE) MCG/ACT inhaler Inhale 2 puffs into the lungs every 4 (four) hours as needed for wheezing or shortness of breath. 1 Inhaler 11  . Cholecalciferol (VITAMIN D) 2000 UNITS CAPS Take 2 capsules by mouth daily.      . clopidogrel (PLAVIX) 75 MG tablet Take 1 tablet (75 mg total) by mouth daily. 90 tablet 2  . CRESTOR 10 MG tablet TAKE 1 TABLET BY MOUTH AT BEDTIME 90 tablet 1  . cyclobenzaprine (FLEXERIL) 10 MG tablet Take 0.5-1 tablets (5-10 mg total) by mouth 3 (three) times daily as needed for muscle spasms. Careful of sedation and falls 270 tablet 1  . esomeprazole (NEXIUM) 40 MG capsule Take 1 capsule (40 mg total) by mouth daily before breakfast. 90 capsule 2  . Flaxseed, Linseed, (FLAX SEED OIL) 1000 MG CAPS Take 1 capsule by mouth daily.      Marland Kitchen FLUoxetine (PROZAC) 20 MG capsule TAKE ONE CAPSULE BY MOUTH EVERY DAY 90 capsule 3  . losartan (COZAAR) 100 MG tablet TAKE 1/2 TAB BY MOUTH ONCE A DAY 45 tablet 1  . metFORMIN (GLUCOPHAGE) 500 MG tablet TAKE 1 TABLET BY MOUTH TWICE A DAY WITH MEALS 180 tablet 1  . Misc. Devices (WALKER) MISC Wheeled walker with seat- Pt must use for ambulation-- for use as directed: ambulation dx: 729.5, 780.79, 786.05, 784.0, 715.89.     Marland Kitchen omega-3 acid ethyl esters (LOVAZA) 1 g capsule Take 1 capsule (1 g total) by mouth daily. 90 capsule  2  . spironolactone (ALDACTONE) 50 MG tablet Take 1  tablet (50 mg total) by mouth daily. 90 tablet 2   No current facility-administered medications on file prior to visit.     Review of Systems Review of Systems  Constitutional: Negative for fever, appetite change, fatigue and unexpected weight change.  Eyes: Negative for pain and visual disturbance.  Respiratory: Negative for cough and shortness of breath.   Cardiovascular: Negative for cp or palpitations    Gastrointestinal: Negative for nausea, diarrhea and constipation.  Genitourinary: Negative for urgency and frequency.  Skin: Negative for pallor or rash   Neurological: Negative for weakness, light-headedness, numbness and headaches. pos for poor balance nad mobility MSK pos for chronic shoulder and back pain  Hematological: Negative for adenopathy. Does not bruise/bleed easily.  Psychiatric/Behavioral: Negative for dysphoric mood. The patient is not nervous/anxious.         Objective:   Physical Exam  Constitutional: She appears well-developed and well-nourished. No distress.  Frail but well appearing elderly female sitting  Uses cane for ambulation  HENT:  Head: Normocephalic and atraumatic.  Mouth/Throat: Oropharynx is clear and moist.  Eyes: Conjunctivae and EOM are normal. Pupils are equal, round, and reactive to light.  Neck: Normal range of motion. Neck supple. No JVD present. Carotid bruit is not present. No thyromegaly present.  Cardiovascular: Normal rate, regular rhythm, normal heart sounds and intact distal pulses.  Exam reveals no gallop.   Pulmonary/Chest: Effort normal and breath sounds normal. No respiratory distress. She has no wheezes. She has no rales.  No crackles  Abdominal: Soft. Bowel sounds are normal. She exhibits no distension, no abdominal bruit and no mass. There is no tenderness.  Musculoskeletal: She exhibits no edema.  Nl rom neck w/o tenderness Limited rom both shoulders due to arthritis  Limited rom of LS  Gait is slow with cane       Lymphadenopathy:    She has no cervical adenopathy.  Neurological: She is alert. She has normal strength and normal reflexes. She displays no atrophy and no tremor. No cranial nerve deficit or sensory deficit. She exhibits normal muscle tone. She displays a negative Romberg sign. She displays no seizure activity. Coordination normal.  Gait is slow and wide based with cane also holding on to a family member on the other side   Skin: Skin is warm and dry. No rash noted. No pallor.  3 by 5 cm area of resolving ecchymosis on R shoulder  2-3 cm hematoma on top/posterior of head- soft and blue to yellow in color/not tender   Psychiatric: She has a normal mood and affect.          Assessment & Plan:   Problem List Items Addressed This Visit      Endocrine   DM type 2 (diabetes mellitus, type 2) ([place]) - Primary    Due for A1C It was high at last check  In the past metformin made her "muscles hurt"  She has watched diet on and off - and has been on prednisone lately  Re check today       Relevant Orders   Hemoglobin A1c   Comprehensive metabolic panel     Other   Closed head injury    Reassuring exam- has hematoma/ no neuro changes Disc s/s of subdural hematoma in detail- pt and family will alert if any new ha or other symptoms        Fall in home    With  closed head inj and scalp hematoma-no s/s of subdural hem  Rev ED notes and imaging - re assuring  On plavix Disc what to watch for Disc fall precaution in detail/ handout given and also ref to PT for balance Stressed imp of using walker at all times  She and daughter were in agreement       Relevant Orders   Ambulatory referral to Physical Therapy   Poor balance    Multifactorial and age related  Ref to PT for balance Bettey Costa training      Relevant Orders   Ambulatory referral to Physical Therapy

## 2015-12-11 NOTE — Patient Instructions (Addendum)
Stop at check out for referral to physical therapy for balance (to prevent falls) Here is a handout on fall prevention in the home  If you develop headache/dizziness/nausea/ funny behavior -please let us know immediately  Labs today  Do try hard to limit your sugar and carbohydrate intake

## 2015-12-11 NOTE — Progress Notes (Signed)
Pre visit review using our clinic review tool, if applicable. No additional management support is needed unless otherwise documented below in the visit note. 

## 2015-12-12 LAB — COMPREHENSIVE METABOLIC PANEL
ALT: 13 U/L (ref 0–35)
AST: 11 U/L (ref 0–37)
Albumin: 3.8 g/dL (ref 3.5–5.2)
Alkaline Phosphatase: 85 U/L (ref 39–117)
BILIRUBIN TOTAL: 0.4 mg/dL (ref 0.2–1.2)
BUN: 17 mg/dL (ref 6–23)
CHLORIDE: 93 meq/L — AB (ref 96–112)
CO2: 29 mEq/L (ref 19–32)
CREATININE: 0.83 mg/dL (ref 0.40–1.20)
Calcium: 9.9 mg/dL (ref 8.4–10.5)
GFR: 69.73 mL/min (ref >60.00)
GLUCOSE: 294 mg/dL — AB (ref 70–99)
Potassium: 5.4 mEq/L — ABNORMAL HIGH (ref 3.5–5.1)
SODIUM: 129 meq/L — AB (ref 135–145)
Total Protein: 6.8 g/dL (ref 6.0–8.3)

## 2015-12-12 LAB — HEMOGLOBIN A1C: HEMOGLOBIN A1C: 8.3 % — AB (ref 4.6–6.5)

## 2015-12-12 NOTE — Assessment & Plan Note (Signed)
Reassuring exam- has hematoma/ no neuro changes Disc s/s of subdural hematoma in detail- pt and family will alert if any new ha or other symptoms

## 2015-12-12 NOTE — Assessment & Plan Note (Signed)
Multifactorial and age related  Ref to PT for balance Bettey Costa training

## 2015-12-12 NOTE — Assessment & Plan Note (Signed)
With closed head inj and scalp hematoma-no s/s of subdural hem  Rev ED notes and imaging - re assuring  On plavix Disc what to watch for Disc fall precaution in detail/ handout given and also ref to PT for balance Stressed imp of using walker at all times  She and daughter were in agreement

## 2015-12-12 NOTE — Assessment & Plan Note (Signed)
Due for A1C It was high at last check  In the past metformin made her "muscles hurt"  She has watched diet on and off - and has been on prednisone lately  Re check today

## 2015-12-17 DIAGNOSIS — H1033 Unspecified acute conjunctivitis, bilateral: Secondary | ICD-10-CM | POA: Diagnosis not present

## 2015-12-17 LAB — HM DIABETES EYE EXAM

## 2015-12-28 ENCOUNTER — Telehealth: Payer: Self-pay | Admitting: Family Medicine

## 2015-12-28 MED ORDER — FUROSEMIDE 20 MG PO TABS: 20.0000 mg | ORAL_TABLET | Freq: Every day | ORAL | 0 refills | Status: DC

## 2015-12-28 NOTE — Telephone Encounter (Signed)
I would like to try her on some lasix for swelling-this does not tend to raise K (it can lower it) Please call in 20 mg daily  Please have them call us Monday to see how swelling is so we can make a plan  Go to ED or UC if symptoms suddenly worsen

## 2015-12-28 NOTE — Telephone Encounter (Signed)
Patient Name: [name]  DOB: 10-Apr-1930    Initial Comment Caller states her mother has swollen ankles, was taken off prednisone    Nurse Assessment  Nurse: Raphael Gibney, RN, Vanita Ingles Date/Time (Eastern Time): 12/28/2015 11:02:06 AM  Confirm and document reason for call. If symptomatic, describe symptoms. ---Caller states mother's ankles are swollen. May be swollen 3 inches above the ankles. Prednisone was discontinued 2 weeks ago. Spironolactone was discontinued 2 weeks ago as her potassium was not in therapeutic range. Has appt on 12/6.  Does the patient have any new or worsening symptoms? ---Yes  Will a triage be completed? ---Yes  Related visit to physician within the last 2 weeks? ---No  Does the PT have any chronic conditions? (i.e. diabetes, asthma, etc.) ---Yes  List chronic conditions. ---CVA; diabetes  Is this a behavioral health or substance abuse call? ---No     Guidelines    Guideline Title Affirmed Question Affirmed Notes  Leg Swelling and Edema [1] Difficulty breathing with exertion (e.g., walking) AND [2] new onset or worsening    Final Disposition User   Go to ED Now (or PCP triage) Raphael Gibney, RN, [place]    Comments  No appts available at [facility] within 4 hs. Daughter does not want to take her to another office. She would like call back from Dr. Marliss Coots office before taking her to the ER or urgent care.  Called back line at office and spoke to [facility] and gave report that pt has swelling in her ankles and about 3 inches above her ankles. She is more SOB on exertion with triage outcome of go to ER now (or PCP triage). No appts available within 4 hrs. Daughter would like call back from office regarding whether or not she needs to go to urgent care or ER.   Referrals  GO TO FACILITY REFUSED   Disagree/Comply: Disagree  Disagree/Comply Reason: Disagree with instructions

## 2015-12-28 NOTE — Telephone Encounter (Signed)
[facility] called Pt got a go to edor urgent care dispoition for swelling in ankles and legs and sob upon exertion  Pt wants call from office  Th will be sending over report  Thanks

## 2015-12-28 NOTE — Telephone Encounter (Signed)
Daughter notified Rx sent to pharmacy and advise her of Dr. Marliss Coots instructions and she verbalized understanding. I advise her if sxs worsen or develops new sxs over weekend to take pt to UC or ER

## 2015-12-31 ENCOUNTER — Encounter: Payer: Self-pay | Admitting: Family Medicine

## 2015-12-31 DIAGNOSIS — H25013 Cortical age-related cataract, bilateral: Secondary | ICD-10-CM | POA: Diagnosis not present

## 2015-12-31 DIAGNOSIS — E119 Type 2 diabetes mellitus without complications: Secondary | ICD-10-CM | POA: Diagnosis not present

## 2016-01-02 ENCOUNTER — Encounter: Payer: Self-pay | Admitting: Family Medicine

## 2016-01-02 ENCOUNTER — Ambulatory Visit (INDEPENDENT_AMBULATORY_CARE_PROVIDER_SITE_OTHER): Payer: Medicare Other | Admitting: Family Medicine

## 2016-01-02 VITALS — BP 110/58 | HR 83 | Temp 98.2°F | Ht <= 58 in | Wt 182.2 lb

## 2016-01-02 DIAGNOSIS — E11618 Type 2 diabetes mellitus with other diabetic arthropathy: Secondary | ICD-10-CM

## 2016-01-02 DIAGNOSIS — I10 Essential (primary) hypertension: Secondary | ICD-10-CM | POA: Diagnosis not present

## 2016-01-02 DIAGNOSIS — E875 Hyperkalemia: Secondary | ICD-10-CM

## 2016-01-02 DIAGNOSIS — E871 Hypo-osmolality and hyponatremia: Secondary | ICD-10-CM | POA: Diagnosis not present

## 2016-01-02 DIAGNOSIS — G4733 Obstructive sleep apnea (adult) (pediatric): Secondary | ICD-10-CM | POA: Diagnosis not present

## 2016-01-02 DIAGNOSIS — R600 Localized edema: Secondary | ICD-10-CM | POA: Diagnosis not present

## 2016-01-02 LAB — BASIC METABOLIC PANEL
BUN: 20 mg/dL (ref 6–23)
CHLORIDE: 96 meq/L (ref 96–112)
CO2: 30 mEq/L (ref 19–32)
Calcium: 9.4 mg/dL (ref 8.4–10.5)
Creatinine, Ser: 0.71 mg/dL (ref 0.40–1.20)
GFR: 83.48 mL/min (ref >60.00)
Glucose, Bld: 177 mg/dL — ABNORMAL HIGH (ref 70–99)
Potassium: 4 mEq/L (ref 3.5–5.1)
SODIUM: 133 meq/L — AB (ref 135–145)

## 2016-01-02 NOTE — Assessment & Plan Note (Signed)
No changes-on cpap

## 2016-01-02 NOTE — Assessment & Plan Note (Signed)
Bothersome off spironolactone but lasix is helping On cardiac red flags Re assuring exam  Disc non pharm ways to help incl supp hose and elevation and dash diet (given)  Re check at f/u Lab today for lasix

## 2016-01-02 NOTE — Assessment & Plan Note (Signed)
Now tolerates metformin  Also off prednisone  Plan to rev her DM ed materials and get back on DM diet Disc this in detail  Lab Results  Component Value Date   HGBA1C 8.3 (H) 12/11/2015   Lab and f/u 3 mo

## 2016-01-02 NOTE — Progress Notes (Signed)
Subjective:    Patient ID: Lydia King, female    DOB: 29-Jan-1930, 82 y.o.   MRN: [id number]  HPI Here for f/u of chronic medical problems  Wt Readings from Last 3 Encounters:  01/02/16 182 lb 4 oz (82.7 kg)  12/11/15 180 lb 8 oz (81.9 kg)  12/07/15 180 lb (81.6 kg)   bmi is 38.7  bp is stable today (well controlled)  No cp or palpitations or headaches or edema  No side effects to medicines  BP Readings from Last 3 Encounters:  01/02/16 (!) 110/58  12/11/15 124/76  12/07/15 (!) 152/119       Chemistry      Component Value Date/Time   NA 129 (L) 12/11/2015 1630   K 5.4 (H) 12/11/2015 1630   CL 93 (L) 12/11/2015 1630   CO2 29 12/11/2015 1630   BUN 17 12/11/2015 1630   CREATININE 0.83 12/11/2015 1630      Component Value Date/Time   CALCIUM 9.9 12/11/2015 1630   ALKPHOS 85 12/11/2015 1630   AST 11 12/11/2015 1630   ALT 13 12/11/2015 1630   BILITOT 0.4 12/11/2015 1630    Has her hold her spironolactone for high K and low na Changed to lasix when she dev more edema    The lasix improved her swelling and then she swelled more Pt is uncomfortable from her swelling and unhappy  When she is at home she elevates her feet  Does not wear support stockings   No more sob than normal No chest pain  No PND , no orthopnea   Uses cpap that she uses regularly as well    Hx of DM with Charcot Lelan Pons  Diabetes Home sugar results 140s, low 150s in the am fasting  DM diet -has done DM classes in the past  She is eating too many bananas  Thinks she sticks to a diabetic diet about 60 %  Really watches starches - but servings are too big  Exercise - very limited , she walks in the house as tolerated  Symptoms-none  A1C last  Lab Results  Component Value Date   HGBA1C 8.3 (H) 12/11/2015  up from 7.1  Had been on prednisone   No problems with medications - now back on metformin -not causing any problems or muscle pain  Renal protection ARB Last eye exam  -Monday-no  retinopathy   Starting her PT for balance soon (in light of her last fall)  Patient Active Problem List   Diagnosis Date Noted  . OSA (obstructive sleep apnea) 01/02/2016  . Pedal edema 01/02/2016  . Hyponatremia 01/02/2016  . Hyperkalemia 01/02/2016  . Fall in home 12/11/2015  . Poor balance 12/11/2015  . Closed head injury 12/11/2015  . Low back pain with sciatica 10/02/2015  . Right hip pain 10/02/2015  . Hearing loss 04/18/2015  . Encounter for Medicare annual wellness exam 10/04/2013  . DM type 2 (diabetes mellitus, type 2) ([place]) 08/31/2012  . CVA 09/12/2008  . OSTEOARTHRITIS, MULTIPLE JOINTS 05/30/2008  . OSTEOPENIA 05/30/2008  . Vitamin D deficiency 07/14/2007  . SLEEP APNEA 07/14/2007  . Hyperlipidemia 05/26/2007  . Essential hypertension 11/30/2006  . Asthma 11/30/2006  . H/O gastroesophageal reflux (GERD) 11/30/2006  . G I BLEED 11/30/2006  . DEEP VENOUS THROMBOPHLEBITIS, HX OF 11/30/2006   Past Medical History:  Diagnosis Date  . Anemia    in past (from blood loss at hiatal hernia)  . Asthma   . Charcot-Marie-Tooth disease  walks with cane (fairly controlled)  . Diverticulosis   . DVT (deep venous thrombosis) ([place]) 2003   after sx  . GERD (gastroesophageal reflux disease)   . Grief reaction    on fluoxetine  . Headache(784.0)    migraine  . Hiatal hernia   . Hyperlipidemia   . Hypertension   . OA (ocular albinism) ([place])   . Osteopenia   . PUD (peptic ulcer disease)    gastric, past  . Shingles   . Stroke ([place]) 8/10   posterior reversible encephalopathy syndrome  . Urine incontinence    Past Surgical History:  Procedure Laterality Date  . carotid dopplers  8/10  . COLONOSCOPY  2002  . HERNIA REPAIR  2002   bleeding  . HIATAL HERNIA REPAIR  2000   needed blood transfusion  . Stroke  8/10   hosp stroke/ posterior (vs posterior reversible encephalopathy syndrome) - d/c on Plavix  . TEE normal  [id number]   no emoblic source  . TOTAL KNEE  ARTHROPLASTY  03,06   Bilateral  . VESICOVAGINAL FISTULA CLOSURE W/ TAH  1964   with bleeding and cervical dysplasia   Social History  Substance Use Topics  . Smoking status: Never Smoker  . Smokeless tobacco: Never Used  . Alcohol use No   Family History  Problem Relation Age of Onset  . Cerebral aneurysm Son   . Cerebral aneurysm Father   . Stroke Mother   . Cervical cancer Sister   . Hyperlipidemia Father   . Alcohol abuse Father   . Hyperlipidemia Brother   . Alcohol abuse Brother   . Stroke Brother   . Diabetes Brother   . Diabetes Sister   . Sarcoidosis Sister   . Other Sister     OP/ Broken hip  . Other      whole family has charcot marie tooth  . Ovarian cancer Sister    Allergies  Allergen Reactions  . Shrimp [Shellfish Allergy] Anaphylaxis    Anaphylaxis-swelling of throat/sob    . Amlodipine Besylate     REACTION: edema  . Atorvastatin     REACTION: pain- could not walk  . Ciprofloxacin     REACTION: unkown  . Codeine     REACTION: itchy rash  . Influenza Vaccines Other (See Comments)    Arm swelling, redness, chills, fever  . Nitrofurantoin   . Omeprazole     REACTION: not effective  . Pantoprazole Sodium     REACTION: does not work well  . Penicillins     REACTION: itchy rash  . Prednisone     REACTION: RASH.// SHORTNESS OF BREATH  . Simvastatin     REACTION: leg pain  . Sulfonamide Derivatives     REACTION: itchy rash   Current Outpatient Prescriptions on File Prior to Visit  Medication Sig Dispense Refill  . acetaminophen (TYLENOL) 500 MG tablet Take 500 mg by mouth every 8 (eight) hours as needed.     Marland Kitchen albuterol (PROVENTIL HFA;VENTOLIN HFA) 108 (90 BASE) MCG/ACT inhaler Inhale 2 puffs into the lungs every 4 (four) hours as needed for wheezing or shortness of breath. 1 Inhaler 11  . Cholecalciferol (VITAMIN D) 2000 UNITS CAPS Take 2 capsules by mouth daily.      . clopidogrel (PLAVIX) 75 MG tablet Take 1 tablet (75 mg total) by mouth  daily. 90 tablet 2  . CRESTOR 10 MG tablet TAKE 1 TABLET BY MOUTH AT BEDTIME 90 tablet 1  . cyclobenzaprine (FLEXERIL)  10 MG tablet Take 0.5-1 tablets (5-10 mg total) by mouth 3 (three) times daily as needed for muscle spasms. Careful of sedation and falls 270 tablet 1  . esomeprazole (NEXIUM) 40 MG capsule Take 1 capsule (40 mg total) by mouth daily before breakfast. 90 capsule 2  . Flaxseed, Linseed, (FLAX SEED OIL) 1000 MG CAPS Take 1 capsule by mouth daily.      Marland Kitchen FLUoxetine (PROZAC) 20 MG capsule TAKE ONE CAPSULE BY MOUTH EVERY DAY 90 capsule 3  . furosemide (LASIX) 20 MG tablet Take 1 tablet (20 mg total) by mouth daily. 30 tablet 0  . losartan (COZAAR) 100 MG tablet TAKE 1/2 TAB BY MOUTH ONCE A DAY 45 tablet 1  . metFORMIN (GLUCOPHAGE) 500 MG tablet TAKE 1 TABLET BY MOUTH TWICE A DAY WITH MEALS 180 tablet 1  . Misc. Devices (WALKER) MISC Wheeled walker with seat- Pt must use for ambulation-- for use as directed: ambulation dx: 729.5, 780.79, 786.05, 784.0, 715.89.     Marland Kitchen omega-3 acid ethyl esters (LOVAZA) 1 g capsule Take 1 capsule (1 g total) by mouth daily. 90 capsule 2   No current facility-administered medications on file prior to visit.     Review of Systems Review of Systems  Constitutional: Negative for fever, appetite change,  and unexpected weight change.  Eyes: Negative for pain and pos for baseline visual disturbance. (macular deg) Respiratory: Negative for cough and shortness of breath.   Cardiovascular: Negative for cp or palpitations   pos for pedal edema/ neg for pnd/orthopnea or sob on exertion more than baseline  Gastrointestinal: Negative for nausea, diarrhea and constipation.  Genitourinary: Negative for urgency and frequency.  Skin: Negative for pallor or rash   Neurological: Negative for weakness, light-headedness, numbness and headaches. Pos for poor balance   Hematological: Negative for adenopathy. Does not bruise/bleed easily.  Psychiatric/Behavioral: Negative  for dysphoric mood. The patient is not nervous/anxious.         Objective:   Physical Exam  Constitutional: She appears well-developed and well-nourished. No distress.  obese and well appearing   HENT:  Head: Normocephalic and atraumatic.  Mouth/Throat: Oropharynx is clear and moist.  Eyes: Conjunctivae and EOM are normal. Pupils are equal, round, and reactive to light.  Neck: Normal range of motion. Neck supple. No JVD present. Carotid bruit is not present. No thyromegaly present.  Cardiovascular: Normal rate, regular rhythm, normal heart sounds and intact distal pulses.  Exam reveals no gallop.   Pulmonary/Chest: Effort normal and breath sounds normal. No respiratory distress. She has no wheezes. She has no rales.  No crackles or rales or wheeze Good air exch  Abdominal: Soft. Bowel sounds are normal. She exhibits no distension, no abdominal bruit and no mass. There is no tenderness.  Musculoskeletal: She exhibits edema.  One plus pitting edema in ankles and feet  No calf tenderness  Lymphadenopathy:    She has no cervical adenopathy.  Neurological: She is alert. She has normal reflexes. No cranial nerve deficit. She exhibits normal muscle tone.  Skin: Skin is warm and dry. No rash noted.  Psychiatric: She has a normal mood and affect.          Assessment & Plan:   Problem List Items Addressed This Visit      Cardiovascular and Mediastinum   Essential hypertension - Primary    bp is well controlled with change from spironolactone to lasix Lab today      Relevant Orders   Basic metabolic  panel (Completed)     Respiratory   OSA (obstructive sleep apnea)    No changes-on cpap        Endocrine   DM type 2 (diabetes mellitus, type 2) ([place])    Now tolerates metformin  Also off prednisone  Plan to rev her DM ed materials and get back on DM diet Disc this in detail  Lab Results  Component Value Date   HGBA1C 8.3 (H) 12/11/2015   Lab and f/u 3 mo          Other   Pedal edema    Bothersome off spironolactone but lasix is helping On cardiac red flags Re assuring exam  Disc non pharm ways to help incl supp hose and elevation and dash diet (given)  Re check at f/u Lab today for lasix      Hyponatremia    Spironolactone stopped On lasix now  Was 129 Pending lab today      Relevant Orders   Basic metabolic panel (Completed)   Hyperkalemia    Stopped spironolactone Lab Results  Component Value Date   K 5.4 (H) 12/11/2015   Re check today On lasix       Relevant Orders   Basic metabolic panel (Completed)

## 2016-01-02 NOTE — Assessment & Plan Note (Signed)
Stopped spironolactone Lab Results  Component Value Date   K 5.4 (H) 12/11/2015   Re check today On lasix

## 2016-01-02 NOTE — Patient Instructions (Addendum)
Lab today  Please continue metformin if you tolerate it  Start checking blood glucose - some am fasting and some 2 hours after a meal  Start keeping a log   Get some over the counter support hose to the knee to wear during the day    Stop the bananas   Get back to a diabetic diet  Watch serving sizes also  Go back and review your diabetic education materials  For exercise -start with 3 five minute walks per day as tolerated

## 2016-01-02 NOTE — Progress Notes (Signed)
Pre visit review using our clinic review tool, if applicable. No additional management support is needed unless otherwise documented below in the visit note. 

## 2016-01-02 NOTE — Assessment & Plan Note (Signed)
Spironolactone stopped On lasix now  Was 129 Pending lab today

## 2016-01-02 NOTE — Assessment & Plan Note (Signed)
bp is well controlled with change from spironolactone to lasix Lab today

## 2016-01-07 ENCOUNTER — Ambulatory Visit: Payer: Medicare Other | Attending: Family Medicine

## 2016-01-07 DIAGNOSIS — R262 Difficulty in walking, not elsewhere classified: Secondary | ICD-10-CM | POA: Diagnosis not present

## 2016-01-07 DIAGNOSIS — M6281 Muscle weakness (generalized): Secondary | ICD-10-CM

## 2016-01-07 NOTE — Therapy (Signed)
[facility] [address] [place], [place], [address] Phone: [phone]   Fax:  [phone]  Physical Therapy Evaluation  Patient Details  Name: Lydia King MRN: [id number] Date of Birth: 1930-12-16 Referring Provider: Abner Greenspan, MD  Encounter Date: 01/07/2016      PT End of Session - 01/07/16 1713    Visit Number 1   Number of Visits 16   Date for PT Re-Evaluation 03/03/16   Authorization Type 1 /10 G code   PT Start Time 1600   PT Stop Time 1700   PT Time Calculation (min) 60 min   Equipment Utilized During Treatment Gait belt   Activity Tolerance Patient tolerated treatment well      Past Medical History:  Diagnosis Date  . Anemia    in past (from blood loss at hiatal hernia)  . Asthma   . Charcot-Marie-Tooth disease    walks with cane (fairly controlled)  . Diverticulosis   . DVT (deep venous thrombosis) ([place]) 2003   after sx  . GERD (gastroesophageal reflux disease)   . Grief reaction    on fluoxetine  . Headache(784.0)    migraine  . Hiatal hernia   . Hyperlipidemia   . Hypertension   . OA (ocular albinism) ([place])   . Osteopenia   . PUD (peptic ulcer disease)    gastric, past  . Shingles   . Stroke ([place]) 8/10   posterior reversible encephalopathy syndrome  . Urine incontinence     Past Surgical History:  Procedure Laterality Date  . carotid dopplers  8/10  . COLONOSCOPY  2002  . HERNIA REPAIR  2002   bleeding  . HIATAL HERNIA REPAIR  2000   needed blood transfusion  . Stroke  8/10   hosp stroke/ posterior (vs posterior reversible encephalopathy syndrome) - d/c on Plavix  . TEE normal  [id number]   no emoblic source  . TOTAL KNEE ARTHROPLASTY  03,06   Bilateral  . VESICOVAGINAL FISTULA CLOSURE W/ TAH  1964   with bleeding and cervical dysplasia    There were no vitals filed for this visit.       Subjective Assessment - 01/07/16 1614    Subjective Patient has difficulty with  walking (uses a SPC) , , unable to lift, patient unable to perform steps with UE support with railing, requires use of grab bars to transfer in/out of shower, can only microwave food items for coooking. Patient reports increased pain along the bilateral hips, lumbar spine, left shoulder and bilateral scapula. Patient states the worst pain has been 10/10, at best 4/10, and is currently 7/10. Patient improves pain with moist heat and tylenol.   Patient is accompained by: Family member  daughter   Pertinent History Charcot Massie Maroon, B TKA, CVA 2010 (Had 15 CVAs),    Limitations Lifting;Standing;Walking   How long can you stand comfortably? 5 min   How long can you walk comfortably? 64ft   Patient Stated Goals Improve balance, and improved stamina   Currently in Pain? Yes   Pain Score 7    Pain Location Back   Pain Orientation Mid   Pain Descriptors / Indicators Constant;Stabbing;Sore   Pain Type Chronic pain   Pain Onset More than a month ago            [facility] PT Assessment - 01/07/16 1608      Assessment   Medical Diagnosis [id number] (ICD-10-CM) - Fall in home, subsequent  encounter   Referring Provider Abner Greenspan, MD   Onset Date/Surgical Date 01/28/08   Hand Dominance Right   Next MD Visit March 2018   Prior Therapy none     Balance Screen   Has the patient fallen in the past 6 months Yes   How many times? 3   Has the patient had a decrease in activity level because of a fear of falling?  Yes   Is the patient reluctant to leave their home because of a fear of falling?  No     Home Environment   Living Environment Private residence   Living Arrangements Spouse/significant other   Available Help at Discharge Family   Type of [place] Access Level entry   [place] seat;Grab bars - tub/shower;Grab bars - toilet;Hand held shower head;Cane - single point;Walker - 2 wheels  low counters, raised commode     Prior  Function   Level of Independence Independent with basic ADLs   Vocation Retired   [employer] N/A   Leisure read, watch tv     Cognition   Overall Cognitive Status Within Functional Limits for tasks assessed     ROM / Strength   AROM / PROM / Strength Strength     Strength   Strength Assessment Site Hip;Knee;Ankle   Right/Left Hip Right;Left   Right Hip Flexion 3+/5   Right Hip ABduction 3+/5   Right Hip ADduction 4-/5   Left Hip Flexion 4-/5   Left Hip ABduction 4-/5   Left Hip ADduction 4-/5   Right/Left Knee Right;Left   Right Knee Flexion 4-/5   Right Knee Extension 4/5   Left Knee Flexion 4-/5   Left Knee Extension 4-/5   Right/Left Ankle Right;Left   Right Ankle Dorsiflexion 3+/5   Right Ankle Plantar Flexion 3+/5   Right Ankle Inversion 3+/5   Right Ankle Eversion 3+/5     Ambulation/Gait   Assistive device --  SPC   Gait Pattern Decreased arm swing - right;Decreased arm swing - left;Decreased dorsiflexion - right;Decreased stance time - right;Decreased stance time - left;Decreased step length - right;Decreased step length - left     Standardized Balance Assessment   Standardized Balance Assessment Timed Up and Go Test;Five Times Sit to Stand;10 meter walk test   Five times sit to stand comments  26sec   With use of B UE's   10 Meter Walk .29m/s     Timed Up and Go Test   Normal TUG (seconds) 24     TREATMENT: Sit to stand with use of UE's -- x10 Seated hip abduction -- x15 with GTB Seated hip adduction with glute squeeze -- x 15        PT Education - 01/07/16 1712    Education provided Yes   Education Details HEP: sit to stand, hip abduction, hip adduction   Person(s) Educated Patient   Methods Explanation;Demonstration;Handout   Comprehension Verbalized understanding;Returned demonstration             PT Long Term Goals - 01/07/16 1727      PT LONG TERM GOAL #1   Title Patient will be able to perform the 69mwt over 1 m/s to  demonstrate significant improvement in fall risk while amb in the community.    Baseline 59mwt: .40m/s   Time 8   Period Weeks   Status New     PT LONG TERM GOAL #2  Title Patient will be able to perform 5xSTS in under 16sec without use of UEs to demonstrate significant improvement in LE strength and decreased fall risk.   Baseline 5xSTS: 26sec   Time 8   Period Weeks   Status New     PT LONG TERM GOAL #3   Title Patient will be able to perform the TUG in under 14sec to demonstrate significant improvement in fall risk and improved ability to amb to the kitchen.    Baseline TUG: 24sec   Time 8   Period Weeks   Status New     PT LONG TERM GOAL #4   Title Patient will be independent with HEP to demonstrate functional carryover of exercise performance at home after discharge from PT   Baseline dependent for exercise performance and progression   Time 8   Period Weeks   Status New               Plan - 02-04-2016 1714    Clinical Impression Statement Pt is a female 82 yo right hand dominant female presenting with increased balance difficulties and fall risk as indicated by decreased TUG, 60mwt, 5xsts, and decreased sharpened rhomberg time. Patient also demonstrates decreased strength as indicated by decreased MMT scores. Patient demonstrates decreased muscular endurance and coordination and will benefit from further skilled therapy focused on improving limitations to return to prior level of function.    Rehab Potential Fair   Clinical Impairments Affecting Rehab Potential (+) highly motivated, family support (-) Charcot Massie Maroon, age, previous CVAs,    PT Frequency 2x / week   PT Duration 8 weeks   PT Treatment/Interventions Gait training;Stair training;Therapeutic activities;Therapeutic exercise;Balance training;Neuromuscular re-education;Patient/family education;Moist Heat;Electrical Stimulation   PT Next Visit Plan Progressively light balance training   PT Home Exercise Plan  sit to stands, hip abduction, hip adduction   Consulted and Agree with Plan of Care Patient      Patient will benefit from skilled therapeutic intervention in order to improve the following deficits and impairments:  Abnormal gait, Decreased activity tolerance, Decreased balance, Decreased endurance, Decreased range of motion, Decreased strength, Difficulty walking, Impaired flexibility, Pain, Postural dysfunction, Increased muscle spasms, Decreased mobility, Impaired UE functional use  Visit Diagnosis: Muscle weakness (generalized)  Difficulty in walking, not elsewhere classified      G-Codes - 2016-02-04 1737    Functional Assessment Tool Used 42mwt, 5xsts, TUG, clinical judgement   Functional Limitation Mobility: Walking and moving around   Mobility: Walking and Moving Around Current Status [phone]) At least 40 percent but less than 60 percent impaired, limited or restricted   Mobility: Walking and Moving Around Goal Status [phone]) At least 1 percent but less than 20 percent impaired, limited or restricted       Problem List Patient Active Problem List   Diagnosis Date Noted  . OSA (obstructive sleep apnea) 01/02/2016  . Pedal edema 01/02/2016  . Hyponatremia 01/02/2016  . Hyperkalemia 01/02/2016  . Fall in home 12/11/2015  . Poor balance 12/11/2015  . Closed head injury 12/11/2015  . Low back pain with sciatica 10/02/2015  . Right hip pain 10/02/2015  . Hearing loss 04/18/2015  . Encounter for Medicare annual wellness exam 10/04/2013  . DM type 2 (diabetes mellitus, type 2) ([place]) 08/31/2012  . CVA 09/12/2008  . OSTEOARTHRITIS, MULTIPLE JOINTS 05/30/2008  . OSTEOPENIA 05/30/2008  . Vitamin D deficiency 07/14/2007  . SLEEP APNEA 07/14/2007  . Hyperlipidemia 05/26/2007  . Essential hypertension 11/30/2006  . Asthma  11/30/2006  . H/O gastroesophageal reflux (GERD) 11/30/2006  . G I BLEED 11/30/2006  . DEEP VENOUS THROMBOPHLEBITIS, HX OF 11/30/2006    Blythe Stanford, PT  DPT 01/07/2016, 5:47 PM  [place] [facility] [address] [place], [place], [address] Phone: [phone]   Fax:  [phone]  Name: RONNESHIA CORRIE MRN: [id number] Date of Birth: [birth year]

## 2016-01-08 ENCOUNTER — Encounter: Payer: Self-pay | Admitting: Family Medicine

## 2016-01-08 ENCOUNTER — Other Ambulatory Visit: Payer: Self-pay | Admitting: Family Medicine

## 2016-01-09 ENCOUNTER — Ambulatory Visit: Payer: Medicare Other | Attending: Family Medicine

## 2016-01-09 DIAGNOSIS — M6281 Muscle weakness (generalized): Secondary | ICD-10-CM | POA: Diagnosis not present

## 2016-01-09 DIAGNOSIS — R262 Difficulty in walking, not elsewhere classified: Secondary | ICD-10-CM

## 2016-01-09 NOTE — Therapy (Signed)
[place] MAIN [facility] SERVICES [place], [place], [address] Phone: [phone]   Fax:  [phone]  Physical Therapy Treatment  Patient Details  Name: Lydia King MRN: [id number] Date of Birth: [birth year] Referring Provider: Abner Greenspan, MD  Encounter Date: 01/09/2016      PT End of Session - 01/09/16 1656    Visit Number 2   Number of Visits 16   Date for PT Re-Evaluation 03/03/16   Authorization Type 2 /10 G code   PT Start Time 1600   PT Stop Time 1642   PT Time Calculation (min) 42 min   Equipment Utilized During Treatment Gait belt   Activity Tolerance Patient tolerated treatment well      Past Medical History:  Diagnosis Date  . Anemia    in past (from blood loss at hiatal hernia)  . Asthma   . Charcot-Marie-Tooth disease    walks with cane (fairly controlled)  . Diverticulosis   . DVT (deep venous thrombosis) ([place]) 2003   after sx  . GERD (gastroesophageal reflux disease)   . Grief reaction    on fluoxetine  . Headache(784.0)    migraine  . Hiatal hernia   . Hyperlipidemia   . Hypertension   . OA (ocular albinism) ([place])   . Osteopenia   . PUD (peptic ulcer disease)    gastric, past  . Shingles   . Stroke ([place]) 8/10   posterior reversible encephalopathy syndrome  . Urine incontinence     Past Surgical History:  Procedure Laterality Date  . carotid dopplers  8/10  . COLONOSCOPY  2002  . HERNIA REPAIR  2002   bleeding  . HIATAL HERNIA REPAIR  2000   needed blood transfusion  . Stroke  8/10   hosp stroke/ posterior (vs posterior reversible encephalopathy syndrome) - d/c on Plavix  . TEE normal  [id number]   no emoblic source  . TOTAL KNEE ARTHROPLASTY  03,06   Bilateral  . VESICOVAGINAL FISTULA CLOSURE W/ TAH  1964   with bleeding and cervical dysplasia    There were no vitals filed for this visit.      Subjective Assessment - 01/09/16 1604    Subjective Patient states no new changes  since her last visit. Reports no falls and states she has been performing her HEP.    Patient is accompained by: Family member  daughter   Pertinent History Charcot Massie Maroon, B TKA, CVA 2010 (Had 15 CVAs),    Limitations Lifting;Standing;Walking   How long can you stand comfortably? 5 min   How long can you walk comfortably? 25ft   Patient Stated Goals Improve balance, and improved stamina   Currently in Pain? Yes   Pain Score 4    Pain Location Back   Pain Orientation Mid   Pain Descriptors / Indicators Constant;Stabbing;Sore   Pain Type Chronic pain   Pain Onset More than a month ago      TREATMENT: Hip flexion in sitting GTB - 2 x 15 Seated hip abduction with GTB - 2 x 15 Seated hip adduction with glute squeeze - 2 x15  Sit to stands with arms across her chest - 2 x 15  Bridges in hookyling - 2 x 7 Marches in hooklying with TrA activation - 2 x 15 Hip abduction/adduction in supine with PT supporting LE - x 10 B Manual Leg press with patient in supine - 2 x 10 Amb with SPC - 2  x 30 ft with cueing on improving heel strike for efficiency with gait cycle  Standing marches with HHA - x 15          PT Education - 01/09/16 1622    Education provided Yes   Education Details HEP: seated marches   Person(s) Educated Patient   Methods Explanation;Demonstration;Handout   Comprehension Verbalized understanding;Returned demonstration             PT Long Term Goals - 01/07/16 1727      PT LONG TERM GOAL #1   Title Patient will be able to perform the 93mwt over 1 m/s to demonstrate significant improvement in fall risk while amb in the community.    Baseline 14mwt: .87m/s   Time 8   Period Weeks   Status New     PT LONG TERM GOAL #2   Title Patient will be able to perform 5xSTS in under 16sec without use of UEs to demonstrate significant improvement in LE strength and decreased fall risk.   Baseline 5xSTS: 26sec   Time 8   Period Weeks   Status New     PT LONG  TERM GOAL #3   Title Patient will be able to perform the TUG in under 14sec to demonstrate significant improvement in fall risk and improved ability to amb to the kitchen.    Baseline TUG: 24sec   Time 8   Period Weeks   Status New     PT LONG TERM GOAL #4   Title Patient will be independent with HEP to demonstrate functional carryover of exercise performance at home after discharge from PT   Baseline dependent for exercise performance and progression   Time 8   Period Weeks   Status New               Plan - 01/09/16 1700    Clinical Impression Statement Pt demonstrates early onset of fatigue with exercise indicating decreased muscular endurance and strength. Patient has early onset of back pain when ambulating and required  one sitting rest break to decrease. Patient will benefit from further skilled therapy focused on improving limitations to decrease fall risk.    Rehab Potential Fair   Clinical Impairments Affecting Rehab Potential (+) highly motivated, family support (-) Charcot Massie Maroon, age, previous CVAs,    PT Frequency 2x / week   PT Duration 8 weeks   PT Treatment/Interventions Gait training;Stair training;Therapeutic activities;Therapeutic exercise;Balance training;Neuromuscular re-education;Patient/family education;Moist Heat;Electrical Stimulation   PT Next Visit Plan Progressively light balance training   PT Home Exercise Plan sit to stands, hip abduction, hip adduction   Consulted and Agree with Plan of Care Patient      Patient will benefit from skilled therapeutic intervention in order to improve the following deficits and impairments:  Abnormal gait, Decreased activity tolerance, Decreased balance, Decreased endurance, Decreased range of motion, Decreased strength, Difficulty walking, Impaired flexibility, Pain, Postural dysfunction, Increased muscle spasms, Decreased mobility, Impaired UE functional use  Visit Diagnosis: Muscle weakness  (generalized)  Difficulty in walking, not elsewhere classified     Problem List Patient Active Problem List   Diagnosis Date Noted  . OSA (obstructive sleep apnea) 01/02/2016  . Pedal edema 01/02/2016  . Hyponatremia 01/02/2016  . Hyperkalemia 01/02/2016  . Fall in home 12/11/2015  . Poor balance 12/11/2015  . Closed head injury 12/11/2015  . Low back pain with sciatica 10/02/2015  . Right hip pain 10/02/2015  . Hearing loss 04/18/2015  . Encounter for Medicare  annual wellness exam 10/04/2013  . DM type 2 (diabetes mellitus, type 2) ([place]) 08/31/2012  . CVA 09/12/2008  . OSTEOARTHRITIS, MULTIPLE JOINTS 05/30/2008  . OSTEOPENIA 05/30/2008  . Vitamin D deficiency 07/14/2007  . SLEEP APNEA 07/14/2007  . Hyperlipidemia 05/26/2007  . Essential hypertension 11/30/2006  . Asthma 11/30/2006  . H/O gastroesophageal reflux (GERD) 11/30/2006  . G I BLEED 11/30/2006  . DEEP VENOUS THROMBOPHLEBITIS, HX OF 11/30/2006    Blythe Stanford, PT DPT 01/09/2016, 5:07 PM  [place] MAIN [facility] SERVICES [address] [place], [place], [address] Phone: [phone]   Fax:  [phone]  Name: Lydia King MRN: [id number] Date of Birth: [birth year]

## 2016-01-14 ENCOUNTER — Ambulatory Visit: Payer: Medicare Other

## 2016-01-14 DIAGNOSIS — R262 Difficulty in walking, not elsewhere classified: Secondary | ICD-10-CM | POA: Diagnosis not present

## 2016-01-14 DIAGNOSIS — M6281 Muscle weakness (generalized): Secondary | ICD-10-CM

## 2016-01-14 NOTE — Therapy (Signed)
[place] MAIN [facility] SERVICES [address] [place], [place], [address] Phone: [phone]   Fax:  [phone]  Physical Therapy Treatment  Patient Details  Name: Lydia King MRN: [id number] Date of Birth: 08-Oct-1930 Referring Provider: Abner Greenspan, MD  Encounter Date: 01/14/2016      PT End of Session - 01/14/16 1642    Visit Number 3   Number of Visits 16   Date for PT Re-Evaluation 03/03/16   Authorization Type 3 /10 G code   PT Start Time 1600   PT Stop Time [id number]   PT Time Calculation (min) 45 min   Equipment Utilized During Treatment Gait belt   Activity Tolerance Patient tolerated treatment well      Past Medical History:  Diagnosis Date  . Anemia    in past (from blood loss at hiatal hernia)  . Asthma   . Charcot-Marie-Tooth disease    walks with cane (fairly controlled)  . Diverticulosis   . DVT (deep venous thrombosis) ([place]) 2003   after sx  . GERD (gastroesophageal reflux disease)   . Grief reaction    on fluoxetine  . Headache(784.0)    migraine  . Hiatal hernia   . Hyperlipidemia   . Hypertension   . OA (ocular albinism) ([place])   . Osteopenia   . PUD (peptic ulcer disease)    gastric, past  . Shingles   . Stroke ([place]) 8/10   posterior reversible encephalopathy syndrome  . Urine incontinence     Past Surgical History:  Procedure Laterality Date  . carotid dopplers  8/10  . COLONOSCOPY  2002  . HERNIA REPAIR  2002   bleeding  . HIATAL HERNIA REPAIR  2000   needed blood transfusion  . Stroke  8/10   hosp stroke/ posterior (vs posterior reversible encephalopathy syndrome) - d/c on Plavix  . TEE normal  [id number]   no emoblic source  . TOTAL KNEE ARTHROPLASTY  03,06   Bilateral  . VESICOVAGINAL FISTULA CLOSURE W/ TAH  1964   with bleeding and cervical dysplasia    There were no vitals filed for this visit.      Subjective Assessment - 01/14/16 1607    Subjective Patient states increased back  pain today compared the prvious visit. Reports her back pain started increasing yesterday and continues to be increased today.   Patient is accompained by: Family member  daughter   Pertinent History Charcot Massie Maroon, B TKA, CVA 2010 (Had 15 CVAs),    Limitations Lifting;Standing;Walking   How long can you stand comfortably? 5 min   How long can you walk comfortably? 21ft   Patient Stated Goals Improve balance, and improved stamina   Currently in Pain? Yes   Pain Score 2    Pain Location Back   Pain Orientation Mid   Pain Descriptors / Indicators Constant;Stabbing;Sore   Pain Type Chronic pain   Pain Onset More than a month ago      TREATMENT: Nustep level 2 at seat level 6 - 5 min Hip flexion in sitting GTB - 3 x 15 Seated hip abduction with GTB - 2 x 20 Seated hip adduction with glute squeeze - 2 x15  Sit to stands with arms across her chest - 2 x 15  Standing semi tandem stance with ant/post weight shifts - 2 x 20 Semi Tandem stance static balance - 2 x 45 sec each side Leg press at MATRIX - 3 x 15 75#  PT Education - 01/14/16 1642    Education provided Yes   Education Details HEP; Seated marches, seated abd, seated add, sit to stands, bridges   Person(s) Educated Patient   Methods Explanation;Demonstration;Handout   Comprehension Verbalized understanding;Returned demonstration             PT Long Term Goals - 01/07/16 1727      PT LONG TERM GOAL #1   Title Patient will be able to perform the 31mwt over 1 m/s to demonstrate significant improvement in fall risk while amb in the community.    Baseline 58mwt: .57m/s   Time 8   Period Weeks   Status New     PT LONG TERM GOAL #2   Title Patient will be able to perform 5xSTS in under 16sec without use of UEs to demonstrate significant improvement in LE strength and decreased fall risk.   Baseline 5xSTS: 26sec   Time 8   Period Weeks   Status New     PT LONG TERM GOAL #3   Title Patient will be able  to perform the TUG in under 14sec to demonstrate significant improvement in fall risk and improved ability to amb to the kitchen.    Baseline TUG: 24sec   Time 8   Period Weeks   Status New     PT LONG TERM GOAL #4   Title Patient will be independent with HEP to demonstrate functional carryover of exercise performance at home after discharge from PT   Baseline dependent for exercise performance and progression   Time 8   Period Weeks   Status New               Plan - 01/14/16 1643    Clinical Impression Statement Pt demonsotrates improved muscular endurance/strength with exercises today indicating functional carryover between visits. Patient continues to demonstrate increased fatigue but was able to stand 20 min without requiring a sitting rest break. Patient will benefit from further skilled therapy to return to prior level of function.    Rehab Potential Fair   Clinical Impairments Affecting Rehab Potential (+) highly motivated, family support (-) Charcot Massie Maroon, age, previous CVAs,    PT Frequency 2x / week   PT Duration 8 weeks   PT Treatment/Interventions Gait training;Stair training;Therapeutic activities;Therapeutic exercise;Balance training;Neuromuscular re-education;Patient/family education;Moist Heat;Electrical Stimulation   PT Next Visit Plan Progressively light balance training   PT Home Exercise Plan sit to stands, hip abduction, hip adduction   Consulted and Agree with Plan of Care Patient      Patient will benefit from skilled therapeutic intervention in order to improve the following deficits and impairments:  Abnormal gait, Decreased activity tolerance, Decreased balance, Decreased endurance, Decreased range of motion, Decreased strength, Difficulty walking, Impaired flexibility, Pain, Postural dysfunction, Increased muscle spasms, Decreased mobility, Impaired UE functional use  Visit Diagnosis: Muscle weakness (generalized)  Difficulty in walking, not  elsewhere classified     Problem List Patient Active Problem List   Diagnosis Date Noted  . OSA (obstructive sleep apnea) 01/02/2016  . Pedal edema 01/02/2016  . Hyponatremia 01/02/2016  . Hyperkalemia 01/02/2016  . Fall in home 12/11/2015  . Poor balance 12/11/2015  . Closed head injury 12/11/2015  . Low back pain with sciatica 10/02/2015  . Right hip pain 10/02/2015  . Hearing loss 04/18/2015  . Encounter for Medicare annual wellness exam 10/04/2013  . DM type 2 (diabetes mellitus, type 2) ([place]) 08/31/2012  . CVA 09/12/2008  . OSTEOARTHRITIS, MULTIPLE JOINTS  05/30/2008  . OSTEOPENIA 05/30/2008  . Vitamin D deficiency 07/14/2007  . SLEEP APNEA 07/14/2007  . Hyperlipidemia 05/26/2007  . Essential hypertension 11/30/2006  . Asthma 11/30/2006  . H/O gastroesophageal reflux (GERD) 11/30/2006  . G I BLEED 11/30/2006  . DEEP VENOUS THROMBOPHLEBITIS, HX OF 11/30/2006    Blythe Stanford, PT DPT 01/14/2016, 5:54 PM  [place] MAIN [facility] SERVICES [address] [place], [place], [address] Phone: [phone]   Fax:  [phone]  Name: PRESLIE REINSCH MRN: [id number] Date of Birth: [birth year]

## 2016-01-16 ENCOUNTER — Ambulatory Visit: Payer: Medicare Other

## 2016-01-16 DIAGNOSIS — R262 Difficulty in walking, not elsewhere classified: Secondary | ICD-10-CM

## 2016-01-16 DIAGNOSIS — M6281 Muscle weakness (generalized): Secondary | ICD-10-CM | POA: Diagnosis not present

## 2016-01-16 NOTE — Therapy (Signed)
[place] MAIN [facility] SERVICES [address] [place], [place], [address] Phone: [phone]   Fax:  [phone]  Physical Therapy Treatment  Patient Details  Name: Lydia King MRN: [id number] Date of Birth: February 10, 1930 Referring Provider: Abner Greenspan, MD  Encounter Date: 01/16/2016      PT End of Session - 01/16/16 1625    Visit Number 4   Number of Visits 16   Date for PT Re-Evaluation 03/03/16   Authorization Type 4 /10 G code   PT Start Time 1600   PT Stop Time [id number]   PT Time Calculation (min) 45 min   Equipment Utilized During Treatment Gait belt   Activity Tolerance Patient tolerated treatment well      Past Medical History:  Diagnosis Date  . Anemia    in past (from blood loss at hiatal hernia)  . Asthma   . Charcot-Marie-Tooth disease    walks with cane (fairly controlled)  . Diverticulosis   . DVT (deep venous thrombosis) ([place]) 2003   after sx  . GERD (gastroesophageal reflux disease)   . Grief reaction    on fluoxetine  . Headache(784.0)    migraine  . Hiatal hernia   . Hyperlipidemia   . Hypertension   . OA (ocular albinism) ([place])   . Osteopenia   . PUD (peptic ulcer disease)    gastric, past  . Shingles   . Stroke ([place]) 8/10   posterior reversible encephalopathy syndrome  . Urine incontinence     Past Surgical History:  Procedure Laterality Date  . carotid dopplers  8/10  . COLONOSCOPY  2002  . HERNIA REPAIR  2002   bleeding  . HIATAL HERNIA REPAIR  2000   needed blood transfusion  . Stroke  8/10   hosp stroke/ posterior (vs posterior reversible encephalopathy syndrome) - d/c on Plavix  . TEE normal  [id number]   no emoblic source  . TOTAL KNEE ARTHROPLASTY  03,06   Bilateral  . VESICOVAGINAL FISTULA CLOSURE W/ TAH  1964   with bleeding and cervical dysplasia    There were no vitals filed for this visit.      Subjective Assessment - 01/16/16 1609    Subjective Patient states increased pain  between her scapulae and low back. States she was slightly sore after the last visit.    Patient is accompained by: Family member  daughter   Pertinent History Charcot Massie Maroon, B TKA, CVA 2010 (Had 15 CVAs),    Limitations Lifting;Standing;Walking   How long can you stand comfortably? 5 min   How long can you walk comfortably? 25ft   Patient Stated Goals Improve balance, and improved stamina   Currently in Pain? No/denies   Pain Score 5    Pain Location Back   Pain Orientation Mid   Pain Descriptors / Indicators Constant   Pain Type Chronic pain   Pain Onset More than a month ago      TREATMENT: Nustep level 2 at seat level 7 - 5 min Step ups onto 6" step - x10 B Standing scapular retraction for balance and strengthening at MATRIX - 2 x 15  Standing hip abduction at  bars - 2 x 8 B  Seated hip adduction with glute squeeze - 2 x15  Hip flexion in sitting- 2 x 15 Heel raises with B UE support - 2 x 10 Standing semi tandem stance with head turns up/down - 30sec followed by 10 head turns B  Modified single leg stance with one foot on airex pad - 2 x 45 sec B Box taps from ground onto airex pad - 2x10 B         PT Education - 01/16/16 1625    Education provided Yes   Education Details Form and technique with exercise performance   Person(s) Educated Patient   Methods Explanation;Demonstration   Comprehension Verbalized understanding;Returned demonstration             PT Long Term Goals - 01/07/16 1727      PT LONG TERM GOAL #1   Title Patient will be able to perform the 69mwt over 1 m/s to demonstrate significant improvement in fall risk while amb in the community.    Baseline 6mwt: .75m/s   Time 8   Period Weeks   Status New     PT LONG TERM GOAL #2   Title Patient will be able to perform 5xSTS in under 16sec without use of UEs to demonstrate significant improvement in LE strength and decreased fall risk.   Baseline 5xSTS: 26sec   Time 8   Period Weeks    Status New     PT LONG TERM GOAL #3   Title Patient will be able to perform the TUG in under 14sec to demonstrate significant improvement in fall risk and improved ability to amb to the kitchen.    Baseline TUG: 24sec   Time 8   Period Weeks   Status New     PT LONG TERM GOAL #4   Title Patient will be independent with HEP to demonstrate functional carryover of exercise performance at home after discharge from PT   Baseline dependent for exercise performance and progression   Time 8   Period Weeks   Status New               Plan - 01/16/16 1627    Clinical Impression Statement Pt demonstrates increased soreness today and performed exercises targeting the gastrocnemius and hip muscular weakness. Patient continues to require sitting rest breaks throughout the span of therapy and will benefit from further skilled therapy focusing on improving endurance to return to prior level of function.     Rehab Potential Fair   Clinical Impairments Affecting Rehab Potential (+) highly motivated, family support (-) Charcot Massie Maroon, age, previous CVAs,    PT Frequency 2x / week   PT Duration 8 weeks   PT Treatment/Interventions Gait training;Stair training;Therapeutic activities;Therapeutic exercise;Balance training;Neuromuscular re-education;Patient/family education;Moist Heat;Electrical Stimulation   PT Next Visit Plan Progressively light balance training   PT Home Exercise Plan sit to stands, hip abduction, hip adduction   Consulted and Agree with Plan of Care Patient      Patient will benefit from skilled therapeutic intervention in order to improve the following deficits and impairments:  Abnormal gait, Decreased activity tolerance, Decreased balance, Decreased endurance, Decreased range of motion, Decreased strength, Difficulty walking, Impaired flexibility, Pain, Postural dysfunction, Increased muscle spasms, Decreased mobility, Impaired UE functional use  Visit  Diagnosis: Muscle weakness (generalized)  Difficulty in walking, not elsewhere classified     Problem List Patient Active Problem List   Diagnosis Date Noted  . OSA (obstructive sleep apnea) 01/02/2016  . Pedal edema 01/02/2016  . Hyponatremia 01/02/2016  . Hyperkalemia 01/02/2016  . Fall in home 12/11/2015  . Poor balance 12/11/2015  . Closed head injury 12/11/2015  . Low back pain with sciatica 10/02/2015  . Right hip pain 10/02/2015  . Hearing loss 04/18/2015  .  Encounter for Medicare annual wellness exam 10/04/2013  . DM type 2 (diabetes mellitus, type 2) ([place]) 08/31/2012  . CVA 09/12/2008  . OSTEOARTHRITIS, MULTIPLE JOINTS 05/30/2008  . OSTEOPENIA 05/30/2008  . Vitamin D deficiency 07/14/2007  . SLEEP APNEA 07/14/2007  . Hyperlipidemia 05/26/2007  . Essential hypertension 11/30/2006  . Asthma 11/30/2006  . H/O gastroesophageal reflux (GERD) 11/30/2006  . G I BLEED 11/30/2006  . DEEP VENOUS THROMBOPHLEBITIS, HX OF 11/30/2006    Blythe Stanford, PT DPT 01/16/2016, 4:41 PM  [place] MAIN [facility] SERVICES [address] [place], [place], [address] Phone: [phone]   Fax:  [phone]  Name: Lydia King MRN: [id number] Date of Birth: 09-19-1930

## 2016-01-18 ENCOUNTER — Telehealth: Payer: Self-pay

## 2016-01-18 MED ORDER — FUROSEMIDE 20 MG PO TABS: 40.0000 mg | ORAL_TABLET | Freq: Every day | ORAL | 0 refills | Status: DC

## 2016-01-18 NOTE — Telephone Encounter (Signed)
She currently takes 20 mg daily I think  Try 40 mg daily and see how she does  Elevate feet/avoid sodium  Update me Tuesday with how that works  Alert me if any other symptoms

## 2016-01-18 NOTE — Telephone Encounter (Signed)
Daughter notified and will try the 40 mg, pt was out of meds so new Rx sent to pharmacy with new dose, and daughter request 90 day supply, done

## 2016-01-18 NOTE — Telephone Encounter (Signed)
Gale(do not see DPR)left v/m; pt was seen 01/02/16;pt was started on lasix and pt does not think lasix is pulling the fluid off as well as spironolactone; pt having little bit of difficulty breathing. Madaline Savage wants to know if lasix could be increased or try different med. [facility] request cb.

## 2016-01-20 ENCOUNTER — Other Ambulatory Visit: Payer: Self-pay | Admitting: Family Medicine

## 2016-01-23 ENCOUNTER — Ambulatory Visit: Payer: Medicare Other

## 2016-01-29 ENCOUNTER — Ambulatory Visit: Payer: Medicare Other | Attending: Family Medicine

## 2016-01-29 DIAGNOSIS — R262 Difficulty in walking, not elsewhere classified: Secondary | ICD-10-CM | POA: Insufficient documentation

## 2016-01-29 DIAGNOSIS — M6281 Muscle weakness (generalized): Secondary | ICD-10-CM | POA: Diagnosis not present

## 2016-01-29 NOTE — Therapy (Signed)
[place] MAIN [facility] SERVICES [place], [place], [address] Phone: [phone]   Fax:  [phone]  Physical Therapy Treatment  Patient Details  Name: Lydia King MRN: [id number] Date of Birth: 14-Oct-1930 Referring Provider: Abner Greenspan, MD  Encounter Date: 01/29/2016      PT End of Session - 01/29/16 1637    Visit Number 5   Number of Visits 16   Date for PT Re-Evaluation 03/03/16   Authorization Type 5 /10 G code   PT Start Time [id number]   PT Stop Time 1700   PT Time Calculation (min) 45 min   Equipment Utilized During Treatment Gait belt   Activity Tolerance Patient tolerated treatment well      Past Medical History:  Diagnosis Date  . Anemia    in past (from blood loss at hiatal hernia)  . Asthma   . Charcot-Marie-Tooth disease    walks with cane (fairly controlled)  . Diverticulosis   . DVT (deep venous thrombosis) ([place]) 2003   after sx  . GERD (gastroesophageal reflux disease)   . Grief reaction    on fluoxetine  . Headache(784.0)    migraine  . Hiatal hernia   . Hyperlipidemia   . Hypertension   . OA (ocular albinism) ([place])   . Osteopenia   . PUD (peptic ulcer disease)    gastric, past  . Shingles   . Stroke ([place]) 8/10   posterior reversible encephalopathy syndrome  . Urine incontinence     Past Surgical History:  Procedure Laterality Date  . carotid dopplers  8/10  . COLONOSCOPY  2002  . HERNIA REPAIR  2002   bleeding  . HIATAL HERNIA REPAIR  2000   needed blood transfusion  . Stroke  8/10   hosp stroke/ posterior (vs posterior reversible encephalopathy syndrome) - d/c on Plavix  . TEE normal  [id number]   no emoblic source  . TOTAL KNEE ARTHROPLASTY  03,06   Bilateral  . VESICOVAGINAL FISTULA CLOSURE W/ TAH  1964   with bleeding and cervical dysplasia    There were no vitals filed for this visit.      Subjective Assessment - 01/29/16 1622    Subjective Patient states she's able to  walk better since the start of therapy and is currently having minor low back pain.    Patient is accompained by: Family member  daughter   Pertinent History Charcot Massie Maroon, B TKA, CVA 2010 (Had 15 CVAs),    Limitations Lifting;Standing;Walking   How long can you stand comfortably? 5 min   How long can you walk comfortably? 32ft   Patient Stated Goals Improve balance, and improved stamina   Currently in Pain? Yes   Pain Score 5    Pain Orientation Mid   Pain Type Chronic pain   Pain Onset More than a month ago      TREATMENT: Nustep level 2 at seat level 7 - 5 min Side steppings up and over airex pad - x 10  Standing hip abduction with UE support- 2 x 10 B Marching in standing on airex - 2 x 10  Standing semi tandem stance with head turns up/down - 30sec followed by 10 head turns B Heel raises with B UE support on the airex pad- 2 x 10 Seated hip adduction with glute squeeze - 2 x 20  Hip flexion in sitting with GTB- 2 x 15 Hip abduction with GTB - 2 x 25  Obstacle stepping around cones and over steps - 3 rounds ~ 15 feet in length       PT Education - 01/29/16 1637    Education provided Yes   Education Details Form and technique with exercises   Person(s) Educated Patient   Methods Explanation;Demonstration   Comprehension Verbalized understanding;Returned demonstration             PT Long Term Goals - 01/07/16 1727      PT LONG TERM GOAL #1   Title Patient will be able to perform the 23mwt over 1 m/s to demonstrate significant improvement in fall risk while amb in the community.    Baseline 77mwt: .59m/s   Time 8   Period Weeks   Status New     PT LONG TERM GOAL #2   Title Patient will be able to perform 5xSTS in under 16sec without use of UEs to demonstrate significant improvement in LE strength and decreased fall risk.   Baseline 5xSTS: 26sec   Time 8   Period Weeks   Status New     PT LONG TERM GOAL #3   Title Patient will be able to perform the  TUG in under 14sec to demonstrate significant improvement in fall risk and improved ability to amb to the kitchen.    Baseline TUG: 24sec   Time 8   Period Weeks   Status New     PT LONG TERM GOAL #4   Title Patient will be independent with HEP to demonstrate functional carryover of exercise performance at home after discharge from PT   Baseline dependent for exercise performance and progression   Time 8   Period Weeks   Status New               Plan - 01/29/16 1702    Clinical Impression Statement Pt demonstrates improved standing tolerance today indicating improved muscular endurance and strength. Although patient is improving, she continues to demonstrate decreased muscular strength and endurance and patient will benefit from further skilled therapy to return to prior level of function.    Rehab Potential Fair   Clinical Impairments Affecting Rehab Potential (+) highly motivated, family support (-) Charcot Massie Maroon, age, previous CVAs,    PT Frequency 2x / week   PT Duration 8 weeks   PT Treatment/Interventions Gait training;Stair training;Therapeutic activities;Therapeutic exercise;Balance training;Neuromuscular re-education;Patient/family education;Moist Heat;Electrical Stimulation   PT Next Visit Plan Progressively light balance training   PT Home Exercise Plan sit to stands, hip abduction, hip adduction   Consulted and Agree with Plan of Care Patient      Patient will benefit from skilled therapeutic intervention in order to improve the following deficits and impairments:  Abnormal gait, Decreased activity tolerance, Decreased balance, Decreased endurance, Decreased range of motion, Decreased strength, Difficulty walking, Impaired flexibility, Pain, Postural dysfunction, Increased muscle spasms, Decreased mobility, Impaired UE functional use  Visit Diagnosis: Muscle weakness (generalized)  Difficulty in walking, not elsewhere classified     Problem List Patient  Active Problem List   Diagnosis Date Noted  . OSA (obstructive sleep apnea) 01/02/2016  . Pedal edema 01/02/2016  . Hyponatremia 01/02/2016  . Hyperkalemia 01/02/2016  . Fall in home 12/11/2015  . Poor balance 12/11/2015  . Closed head injury 12/11/2015  . Low back pain with sciatica 10/02/2015  . Right hip pain 10/02/2015  . Hearing loss 04/18/2015  . Encounter for Medicare annual wellness exam 10/04/2013  . DM type 2 (diabetes mellitus, type 2) ([place]) 08/31/2012  .  CVA 09/12/2008  . OSTEOARTHRITIS, MULTIPLE JOINTS 05/30/2008  . OSTEOPENIA 05/30/2008  . Vitamin D deficiency 07/14/2007  . SLEEP APNEA 07/14/2007  . Hyperlipidemia 05/26/2007  . Essential hypertension 11/30/2006  . Asthma 11/30/2006  . H/O gastroesophageal reflux (GERD) 11/30/2006  . G I BLEED 11/30/2006  . DEEP VENOUS THROMBOPHLEBITIS, HX OF 11/30/2006    Blythe Stanford, PT DPT 01/29/2016, 5:06 PM  [place] MAIN [facility] SERVICES [address] [place], [place], [address] Phone: [phone]   Fax:  [phone]  Name: Lydia King MRN: [id number] Date of Birth: 1930/04/16

## 2016-01-31 ENCOUNTER — Ambulatory Visit: Payer: Medicare Other

## 2016-02-04 ENCOUNTER — Ambulatory Visit: Payer: Medicare Other

## 2016-02-04 DIAGNOSIS — M6281 Muscle weakness (generalized): Secondary | ICD-10-CM

## 2016-02-04 DIAGNOSIS — R262 Difficulty in walking, not elsewhere classified: Secondary | ICD-10-CM

## 2016-02-04 NOTE — Therapy (Signed)
[place] MAIN [facility] SERVICES [address] [place], [place], [address] Phone: [phone]   Fax:  [phone]  Physical Therapy Treatment  Patient Details  Name: Lydia King MRN: [id number] Date of Birth: 01-19-1931 Referring Provider: Abner Greenspan, MD  Encounter Date: 02/04/2016      PT End of Session - 02/04/16 1635    Visit Number 6   Number of Visits 16   Date for PT Re-Evaluation 03/03/16   Authorization Type 6 /10 G code   PT Start Time 1600   PT Stop Time 1645   PT Time Calculation (min) 45 min   Equipment Utilized During Treatment Gait belt   Activity Tolerance Patient tolerated treatment well      Past Medical History:  Diagnosis Date  . Anemia    in past (from blood loss at hiatal hernia)  . Asthma   . Charcot-Marie-Tooth disease    walks with cane (fairly controlled)  . Diverticulosis   . DVT (deep venous thrombosis) ([place]) 2003   after sx  . GERD (gastroesophageal reflux disease)   . Grief reaction    on fluoxetine  . Headache(784.0)    migraine  . Hiatal hernia   . Hyperlipidemia   . Hypertension   . OA (ocular albinism) ([place])   . Osteopenia   . PUD (peptic ulcer disease)    gastric, past  . Shingles   . Stroke ([place]) 8/10   posterior reversible encephalopathy syndrome  . Urine incontinence     Past Surgical History:  Procedure Laterality Date  . carotid dopplers  8/10  . COLONOSCOPY  2002  . HERNIA REPAIR  2002   bleeding  . HIATAL HERNIA REPAIR  2000   needed blood transfusion  . Stroke  8/10   hosp stroke/ posterior (vs posterior reversible encephalopathy syndrome) - d/c on Plavix  . TEE normal  [id number]   no emoblic source  . TOTAL KNEE ARTHROPLASTY  03,06   Bilateral  . VESICOVAGINAL FISTULA CLOSURE W/ TAH  1964   with bleeding and cervical dysplasia    There were no vitals filed for this visit.      Subjective Assessment - 02/04/16 1627    Subjective Patient reports she had  increased soreness over the weekend and states she's been performing HEP at home   Patient is accompained by: Family member  daughter   Pertinent History Charcot Massie Maroon, B TKA, CVA 2010 (Had 15 CVAs),    Limitations Lifting;Standing;Walking   How long can you stand comfortably? 5 min   How long can you walk comfortably? 56ft   Patient Stated Goals Improve balance, and improved stamina   Currently in Pain? Yes   Pain Score 5    Pain Location Back   Pain Orientation Mid   Pain Descriptors / Indicators Constant   Pain Type Chronic pain   Pain Onset More than a month ago      TREATMENT: Nustep level 2 at seat level 7 - 5 min Step ups forward on 6" step - x15; step-ups on airex -2 x15 B Side steppings up and over airex pad - x 10  Single leg balance with elevated foot on balance stone - 45 x 3 Standing hip abduction with UE support- 2 x 10 B Marching in sitting with GTB - 2 x 25 Standing widened semi tandem stance- 30sec x  3        PT Education - 02/04/16 1634  Education provided Yes   Education Details form/technique with exercises    Person(s) Educated Patient   Methods Explanation;Demonstration   Comprehension Verbalized understanding;Returned demonstration             PT Long Term Goals - 01/07/16 1727      PT LONG TERM GOAL #1   Title Patient will be able to perform the 48mwt over 1 m/s to demonstrate significant improvement in fall risk while amb in the community.    Baseline 81mwt: .64m/s   Time 8   Period Weeks   Status New     PT LONG TERM GOAL #2   Title Patient will be able to perform 5xSTS in under 16sec without use of UEs to demonstrate significant improvement in LE strength and decreased fall risk.   Baseline 5xSTS: 26sec   Time 8   Period Weeks   Status New     PT LONG TERM GOAL #3   Title Patient will be able to perform the TUG in under 14sec to demonstrate significant improvement in fall risk and improved ability to amb to the kitchen.     Baseline TUG: 24sec   Time 8   Period Weeks   Status New     PT LONG TERM GOAL #4   Title Patient will be independent with HEP to demonstrate functional carryover of exercise performance at home after discharge from PT   Baseline dependent for exercise performance and progression   Time 8   Period Weeks   Status New               Plan - 02/04/16 1745    Clinical Impression Statement Performed increased sitting rest breaks today secondary to increased soreness to the LE and patient demonstrates increased fatigue at end of session indicating decreased muscular strength. Patient will benefit from further skilled therapy focused on improving muscular strength and endurance to return to prior level of function.    Rehab Potential Fair   Clinical Impairments Affecting Rehab Potential (+) highly motivated, family support (-) Charcot Massie Maroon, age, previous CVAs,    PT Frequency 2x / week   PT Duration 8 weeks   PT Treatment/Interventions Gait training;Stair training;Therapeutic activities;Therapeutic exercise;Balance training;Neuromuscular re-education;Patient/family education;Moist Heat;Electrical Stimulation   PT Next Visit Plan Progressively light balance training   PT Home Exercise Plan sit to stands, hip abduction, hip adduction   Consulted and Agree with Plan of Care Patient      Patient will benefit from skilled therapeutic intervention in order to improve the following deficits and impairments:  Abnormal gait, Decreased activity tolerance, Decreased balance, Decreased endurance, Decreased range of motion, Decreased strength, Difficulty walking, Impaired flexibility, Pain, Postural dysfunction, Increased muscle spasms, Decreased mobility, Impaired UE functional use  Visit Diagnosis: Muscle weakness (generalized)  Difficulty in walking, not elsewhere classified     Problem List Patient Active Problem List   Diagnosis Date Noted  . OSA (obstructive sleep apnea)  01/02/2016  . Pedal edema 01/02/2016  . Hyponatremia 01/02/2016  . Hyperkalemia 01/02/2016  . Fall in home 12/11/2015  . Poor balance 12/11/2015  . Closed head injury 12/11/2015  . Low back pain with sciatica 10/02/2015  . Right hip pain 10/02/2015  . Hearing loss 04/18/2015  . Encounter for Medicare annual wellness exam 10/04/2013  . DM type 2 (diabetes mellitus, type 2) ([place]) 08/31/2012  . CVA 09/12/2008  . OSTEOARTHRITIS, MULTIPLE JOINTS 05/30/2008  . OSTEOPENIA 05/30/2008  . Vitamin D deficiency 07/14/2007  . SLEEP  APNEA 07/14/2007  . Hyperlipidemia 05/26/2007  . Essential hypertension 11/30/2006  . Asthma 11/30/2006  . H/O gastroesophageal reflux (GERD) 11/30/2006  . G I BLEED 11/30/2006  . DEEP VENOUS THROMBOPHLEBITIS, HX OF 11/30/2006    Blythe Stanford, PT DPT 02/04/2016, 5:48 PM  [place] MAIN [facility] SERVICES [address] [place], [place], [address] Phone: [phone]   Fax:  [phone]  Name: Lydia King MRN: [id number] Date of Birth: 16-Nov-1930

## 2016-02-06 ENCOUNTER — Ambulatory Visit: Payer: Medicare Other

## 2016-02-07 ENCOUNTER — Ambulatory Visit: Payer: Medicare Other

## 2016-02-11 ENCOUNTER — Ambulatory Visit: Payer: Medicare Other

## 2016-02-11 DIAGNOSIS — M6281 Muscle weakness (generalized): Secondary | ICD-10-CM | POA: Diagnosis not present

## 2016-02-11 DIAGNOSIS — R262 Difficulty in walking, not elsewhere classified: Secondary | ICD-10-CM | POA: Diagnosis not present

## 2016-02-11 NOTE — Therapy (Signed)
[place] MAIN [facility] SERVICES [address] [place], [place], [address] Phone: [phone]   Fax:  [phone]  Physical Therapy Treatment  Patient Details  Name: Lydia King MRN: [id number] Date of Birth: [birth year] Referring Provider: Abner Greenspan, MD  Encounter Date: 02/11/2016      PT End of Session - 02/11/16 1610    Visit Number 7   Number of Visits 16   Date for PT Re-Evaluation 03/03/16   Authorization Type 7/10 G code   PT Start Time 1602   PT Stop Time 1645   PT Time Calculation (min) 43 min   Equipment Utilized During Treatment Gait belt   Activity Tolerance Patient tolerated treatment well      Past Medical History:  Diagnosis Date  . Anemia    in past (from blood loss at hiatal hernia)  . Asthma   . Charcot-Marie-Tooth disease    walks with cane (fairly controlled)  . Diverticulosis   . DVT (deep venous thrombosis) ([place]) 2003   after sx  . GERD (gastroesophageal reflux disease)   . Grief reaction    on fluoxetine  . Headache(784.0)    migraine  . Hiatal hernia   . Hyperlipidemia   . Hypertension   . OA (ocular albinism) ([place])   . Osteopenia   . PUD (peptic ulcer disease)    gastric, past  . Shingles   . Stroke ([place]) 8/10   posterior reversible encephalopathy syndrome  . Urine incontinence     Past Surgical History:  Procedure Laterality Date  . carotid dopplers  8/10  . COLONOSCOPY  2002  . HERNIA REPAIR  2002   bleeding  . HIATAL HERNIA REPAIR  2000   needed blood transfusion  . Stroke  8/10   hosp stroke/ posterior (vs posterior reversible encephalopathy syndrome) - d/c on Plavix  . TEE normal  [id number]   no emoblic source  . TOTAL KNEE ARTHROPLASTY  03,06   Bilateral  . VESICOVAGINAL FISTULA CLOSURE W/ TAH  1964   with bleeding and cervical dysplasia    There were no vitals filed for this visit.      Subjective Assessment - 02/11/16 1607    Subjective Patient reports she has  increased L foot pain after stepping on a small block over the weekend. Patient states she would like to perform more sitting exercises secondary to increased foot pain.    Patient is accompained by: Family member  daughter   Pertinent History Charcot Massie Maroon, B TKA, CVA 2010 (Had 15 CVAs),    Limitations Lifting;Standing;Walking   How long can you stand comfortably? 5 min   How long can you walk comfortably? 48ft   Patient Stated Goals Improve balance, and improved stamina   Currently in Pain? Yes   Pain Score 5    Pain Location Foot   Pain Orientation Left   Pain Descriptors / Indicators Sore   Pain Type Acute pain   Pain Onset More than a month ago   Pain Frequency Intermittent      TREATMENT: Nustep level 2 at seat level 8 - 7 min Seated Leg press at quantum - x 20 90#, x20 105# with cueing on speed of performance and knee positioning throughout motion; attempted calf raises on leg press-unable to perform secondary to lack of motor control and decreased PF AROM Marching in sitting with 8# weights - 2 x 25 Seated LAQ with 8# weights - 2 x 20 Seated  Hamstring curls in sitting - 2 x 15 with GTB Seated lumbar extension with double black tubing - 2 x 15       PT Education - 02/11/16 1609    Education provided Yes   Education Details Reviewed HEP for technique/form   Person(s) Educated Patient   Methods Explanation;Demonstration   Comprehension Verbalized understanding;Returned demonstration             PT Long Term Goals - 01/07/16 1727      PT LONG TERM GOAL #1   Title Patient will be able to perform the 9mwt over 1 m/s to demonstrate significant improvement in fall risk while amb in the community.    Baseline 69mwt: .56m/s   Time 8   Period Weeks   Status New     PT LONG TERM GOAL #2   Title Patient will be able to perform 5xSTS in under 16sec without use of UEs to demonstrate significant improvement in LE strength and decreased fall risk.   Baseline 5xSTS:  26sec   Time 8   Period Weeks   Status New     PT LONG TERM GOAL #3   Title Patient will be able to perform the TUG in under 14sec to demonstrate significant improvement in fall risk and improved ability to amb to the kitchen.    Baseline TUG: 24sec   Time 8   Period Weeks   Status New     PT LONG TERM GOAL #4   Title Patient will be independent with HEP to demonstrate functional carryover of exercise performance at home after discharge from PT   Baseline dependent for exercise performance and progression   Time 8   Period Weeks   Status New               Plan - 02/11/16 1638    Clinical Impression Statement Performed exercises in sitting today secondary to increased pain along L foot and weight bearing exercises aggravating symptoms. Patient demonstrates ability to perform more sitting exercises compared to previous visits indicating functional carryover between visits and patient will benefit from further skilled therapy to return to prior level of function.    Rehab Potential Fair   Clinical Impairments Affecting Rehab Potential (+) highly motivated, family support (-) Charcot Massie Maroon, age, previous CVAs,    PT Frequency 2x / week   PT Duration 8 weeks   PT Treatment/Interventions Gait training;Stair training;Therapeutic activities;Therapeutic exercise;Balance training;Neuromuscular re-education;Patient/family education;Moist Heat;Electrical Stimulation   PT Next Visit Plan Progressively light balance training   PT Home Exercise Plan sit to stands, hip abduction, hip adduction   Consulted and Agree with Plan of Care Patient      Patient will benefit from skilled therapeutic intervention in order to improve the following deficits and impairments:  Abnormal gait, Decreased activity tolerance, Decreased balance, Decreased endurance, Decreased range of motion, Decreased strength, Difficulty walking, Impaired flexibility, Pain, Postural dysfunction, Increased muscle spasms,  Decreased mobility, Impaired UE functional use  Visit Diagnosis: Muscle weakness (generalized)  Difficulty in walking, not elsewhere classified     Problem List Patient Active Problem List   Diagnosis Date Noted  . OSA (obstructive sleep apnea) 01/02/2016  . Pedal edema 01/02/2016  . Hyponatremia 01/02/2016  . Hyperkalemia 01/02/2016  . Fall in home 12/11/2015  . Poor balance 12/11/2015  . Closed head injury 12/11/2015  . Low back pain with sciatica 10/02/2015  . Right hip pain 10/02/2015  . Hearing loss 04/18/2015  . Encounter for Medicare  annual wellness exam 10/04/2013  . DM type 2 (diabetes mellitus, type 2) ([place]) 08/31/2012  . CVA 09/12/2008  . OSTEOARTHRITIS, MULTIPLE JOINTS 05/30/2008  . OSTEOPENIA 05/30/2008  . Vitamin D deficiency 07/14/2007  . SLEEP APNEA 07/14/2007  . Hyperlipidemia 05/26/2007  . Essential hypertension 11/30/2006  . Asthma 11/30/2006  . H/O gastroesophageal reflux (GERD) 11/30/2006  . G I BLEED 11/30/2006  . DEEP VENOUS THROMBOPHLEBITIS, HX OF 11/30/2006    Blythe Stanford, PT DPT 02/11/2016, 4:49 PM  [place] MAIN [facility] SERVICES [address] [place], [place], [address] Phone: [phone]   Fax:  [phone]  Name: DHARMA MCCONE MRN: [id number] Date of Birth: Oct 02, 1930

## 2016-02-18 ENCOUNTER — Ambulatory Visit: Payer: Medicare Other

## 2016-02-18 DIAGNOSIS — M6281 Muscle weakness (generalized): Secondary | ICD-10-CM | POA: Diagnosis not present

## 2016-02-18 DIAGNOSIS — R262 Difficulty in walking, not elsewhere classified: Secondary | ICD-10-CM

## 2016-02-18 NOTE — Therapy (Signed)
[place] MAIN [facility] SERVICES [address] [place], [place], [address] Phone: [phone]   Fax:  [phone]  Physical Therapy Treatment  Patient Details  Name: Lydia King MRN: [id number] Date of Birth: 24-May-1930 Referring Provider: Abner Greenspan, MD  Encounter Date: 02/18/2016      PT End of Session - 02/18/16 1641    Visit Number 8   Number of Visits 16   Date for PT Re-Evaluation 03/03/16   Authorization Type 8/10 G code   PT Start Time 1602   PT Stop Time 1642   PT Time Calculation (min) 40 min   Equipment Utilized During Treatment Gait belt   Activity Tolerance Patient tolerated treatment well      Past Medical History:  Diagnosis Date  . Anemia    in past (from blood loss at hiatal hernia)  . Asthma   . Charcot-Marie-Tooth disease    walks with cane (fairly controlled)  . Diverticulosis   . DVT (deep venous thrombosis) ([place]) 2003   after sx  . GERD (gastroesophageal reflux disease)   . Grief reaction    on fluoxetine  . Headache(784.0)    migraine  . Hiatal hernia   . Hyperlipidemia   . Hypertension   . OA (ocular albinism) ([place])   . Osteopenia   . PUD (peptic ulcer disease)    gastric, past  . Shingles   . Stroke ([place]) 8/10   posterior reversible encephalopathy syndrome  . Urine incontinence     Past Surgical History:  Procedure Laterality Date  . carotid dopplers  8/10  . COLONOSCOPY  2002  . HERNIA REPAIR  2002   bleeding  . HIATAL HERNIA REPAIR  2000   needed blood transfusion  . Stroke  8/10   hosp stroke/ posterior (vs posterior reversible encephalopathy syndrome) - d/c on Plavix  . TEE normal  [id number]   no emoblic source  . TOTAL KNEE ARTHROPLASTY  03,06   Bilateral  . VESICOVAGINAL FISTULA CLOSURE W/ TAH  1964   with bleeding and cervical dysplasia    There were no vitals filed for this visit.      Subjective Assessment - 02/18/16 1607    Subjective Patient reports she continues to  have increased L foot pain but it is improving overall. States she had increased back soreness after the previous treatment.    Patient is accompained by: Family member  daughter   Pertinent History Charcot Massie Maroon, B TKA, CVA 2010 (Had 15 CVAs),    Limitations Lifting;Standing;Walking   How long can you stand comfortably? 5 min   How long can you walk comfortably? 17ft   Patient Stated Goals Improve balance, and improved stamina   Currently in Pain? Yes   Pain Score 4    Pain Location Foot   Pain Orientation Left   Pain Descriptors / Indicators Sore   Pain Type Acute pain   Pain Onset More than a month ago        TREATMENT: Nustep level 4 at seat level 8 - 7 min Seated Leg press at quantum -  2 x 20 105# with cueing on speed of performance and knee positioning throughout motion Hip abduction in standing - 2 x 10 B Heel raises in standing - 2 x 15  Closed Kinetic chain hip/lumbar extension - x 2 min Standing marches in standing with UE support - 2 x 20 Standing mini squats with B UE support - 2 x  10 Side stepping in the   bars - down and back x5 down and back Forward stepping over half foam in standing - x20 up and over  Seated hip abduction in sitting - x 30 with GTB Seated marching with GTB - x30      PT Education - 02/18/16 1615    Education provided Yes   Education Details form/technique throughout exercise session   Person(s) Educated Patient   Methods Explanation;Demonstration   Comprehension Verbalized understanding;Returned demonstration             PT Long Term Goals - 01/07/16 1727      PT LONG TERM GOAL #1   Title Patient will be able to perform the 7mwt over 1 m/s to demonstrate significant improvement in fall risk while amb in the community.    Baseline 63mwt: .10m/s   Time 8   Period Weeks   Status New     PT LONG TERM GOAL #2   Title Patient will be able to perform 5xSTS in under 16sec without use of UEs to demonstrate significant  improvement in LE strength and decreased fall risk.   Baseline 5xSTS: 26sec   Time 8   Period Weeks   Status New     PT LONG TERM GOAL #3   Title Patient will be able to perform the TUG in under 14sec to demonstrate significant improvement in fall risk and improved ability to amb to the kitchen.    Baseline TUG: 24sec   Time 8   Period Weeks   Status New     PT LONG TERM GOAL #4   Title Patient will be independent with HEP to demonstrate functional carryover of exercise performance at home after discharge from PT   Baseline dependent for exercise performance and progression   Time 8   Period Weeks   Status New               Plan - 02/18/16 1647    Clinical Impression Statement Patient demonstrates less fatigue during and after session compared to previous visits indicating functional carryover and improvement. Although patient is improving, she continues to demonstrate increased need for UE support when balancing and postural sway. Patient will benefit from further skilled therapy to return to prior level of function.    Rehab Potential Fair   Clinical Impairments Affecting Rehab Potential (+) highly motivated, family support (-) Charcot Massie Maroon, age, previous CVAs,    PT Frequency 2x / week   PT Duration 8 weeks   PT Treatment/Interventions Gait training;Stair training;Therapeutic activities;Therapeutic exercise;Balance training;Neuromuscular re-education;Patient/family education;Moist Heat;Electrical Stimulation   PT Next Visit Plan Progressively light balance training   PT Home Exercise Plan sit to stands, hip abduction, hip adduction   Consulted and Agree with Plan of Care Patient      Patient will benefit from skilled therapeutic intervention in order to improve the following deficits and impairments:  Abnormal gait, Decreased activity tolerance, Decreased balance, Decreased endurance, Decreased range of motion, Decreased strength, Difficulty walking, Impaired  flexibility, Pain, Postural dysfunction, Increased muscle spasms, Decreased mobility, Impaired UE functional use  Visit Diagnosis: Muscle weakness (generalized)  Difficulty in walking, not elsewhere classified     Problem List Patient Active Problem List   Diagnosis Date Noted  . OSA (obstructive sleep apnea) 01/02/2016  . Pedal edema 01/02/2016  . Hyponatremia 01/02/2016  . Hyperkalemia 01/02/2016  . Fall in home 12/11/2015  . Poor balance 12/11/2015  . Closed head injury 12/11/2015  .  Low back pain with sciatica 10/02/2015  . Right hip pain 10/02/2015  . Hearing loss 04/18/2015  . Encounter for Medicare annual wellness exam 10/04/2013  . DM type 2 (diabetes mellitus, type 2) ([place]) 08/31/2012  . CVA 09/12/2008  . OSTEOARTHRITIS, MULTIPLE JOINTS 05/30/2008  . OSTEOPENIA 05/30/2008  . Vitamin D deficiency 07/14/2007  . SLEEP APNEA 07/14/2007  . Hyperlipidemia 05/26/2007  . Essential hypertension 11/30/2006  . Asthma 11/30/2006  . H/O gastroesophageal reflux (GERD) 11/30/2006  . G I BLEED 11/30/2006  . DEEP VENOUS THROMBOPHLEBITIS, HX OF 11/30/2006    Blythe Stanford, PT DPT 02/18/2016, 4:50 PM  [place] MAIN [facility] SERVICES [address] [place], [place], [address] Phone: [phone]   Fax:  [phone]  Name: Lydia King MRN: [id number] Date of Birth: [birth year]

## 2016-02-20 ENCOUNTER — Ambulatory Visit: Payer: Medicare Other

## 2016-02-20 DIAGNOSIS — M6281 Muscle weakness (generalized): Secondary | ICD-10-CM

## 2016-02-20 DIAGNOSIS — R262 Difficulty in walking, not elsewhere classified: Secondary | ICD-10-CM | POA: Diagnosis not present

## 2016-02-20 NOTE — Therapy (Signed)
[place] MAIN [facility] SERVICES [address] [place], [place], [address] Phone: [phone]   Fax:  [phone]  Physical Therapy Treatment  Patient Details  Name: Lydia King MRN: [id number] Date of Birth: [birth year] Referring Provider: Abner Greenspan, MD  Encounter Date: 02/20/2016      PT End of Session - 02/20/16 1617    Visit Number 9   Number of Visits 16   Date for PT Re-Evaluation 03/03/16   Authorization Type 9/10 G code   PT Start Time 1600   PT Stop Time 1645   PT Time Calculation (min) 45 min   Equipment Utilized During Treatment Gait belt   Activity Tolerance Patient tolerated treatment well      Past Medical History:  Diagnosis Date  . Anemia    in past (from blood loss at hiatal hernia)  . Asthma   . Charcot-Marie-Tooth disease    walks with cane (fairly controlled)  . Diverticulosis   . DVT (deep venous thrombosis) ([place]) 2003   after sx  . GERD (gastroesophageal reflux disease)   . Grief reaction    on fluoxetine  . Headache(784.0)    migraine  . Hiatal hernia   . Hyperlipidemia   . Hypertension   . OA (ocular albinism) ([place])   . Osteopenia   . PUD (peptic ulcer disease)    gastric, past  . Shingles   . Stroke ([place]) 8/10   posterior reversible encephalopathy syndrome  . Urine incontinence     Past Surgical History:  Procedure Laterality Date  . carotid dopplers  8/10  . COLONOSCOPY  2002  . HERNIA REPAIR  2002   bleeding  . HIATAL HERNIA REPAIR  2000   needed blood transfusion  . Stroke  8/10   hosp stroke/ posterior (vs posterior reversible encephalopathy syndrome) - d/c on Plavix  . TEE normal  [id number]   no emoblic source  . TOTAL KNEE ARTHROPLASTY  03,06   Bilateral  . VESICOVAGINAL FISTULA CLOSURE W/ TAH  1964   with bleeding and cervical dysplasia    There were no vitals filed for this visit.      Subjective Assessment - 02/20/16 1615    Subjective Patient reports she did not feel  sore after the previous session but states she continues to have balance difficulties when initially transfering from sitting to standing.    Patient is accompained by: Family member  daughter   Pertinent History Charcot Massie Maroon, B TKA, CVA 2010 (Had 15 CVAs),    Limitations Lifting;Standing;Walking   How long can you stand comfortably? 5 min   How long can you walk comfortably? 89ft   Patient Stated Goals Improve balance, and improved stamina   Currently in Pain? No/denies   Pain Onset More than a month ago      TREATMENT: Nustep level 4 at seat level 6 - 6 min Seated Leg press at quantum -  2 x 20 105# with cueing on speed of performance and knee positioning throughout motion Seated hip adduction with 4# ball - 2 x 30  Hip abduction in standing - 2 x 15 B Side stepping up and over aiex pad - x 12 Performance of stairs with unilateral UE support - x5 stairs with minA for ascending the stairs Heel raises in standing on airex pad- 2 x 15        PT Education - 02/20/16 1616    Education provided Yes   Education Details  Educated on components of balance   Person(s) Educated Patient   Methods Explanation   Comprehension Verbalized understanding             PT Long Term Goals - 01/07/16 1727      PT LONG TERM GOAL #1   Title Patient will be able to perform the 52mwt over 1 m/s to demonstrate significant improvement in fall risk while amb in the community.    Baseline 64mwt: .88m/s   Time 8   Period Weeks   Status New     PT LONG TERM GOAL #2   Title Patient will be able to perform 5xSTS in under 16sec without use of UEs to demonstrate significant improvement in LE strength and decreased fall risk.   Baseline 5xSTS: 26sec   Time 8   Period Weeks   Status New     PT LONG TERM GOAL #3   Title Patient will be able to perform the TUG in under 14sec to demonstrate significant improvement in fall risk and improved ability to amb to the kitchen.    Baseline TUG: 24sec    Time 8   Period Weeks   Status New     PT LONG TERM GOAL #4   Title Patient will be independent with HEP to demonstrate functional carryover of exercise performance at home after discharge from PT   Baseline dependent for exercise performance and progression   Time 8   Period Weeks   Status New               Plan - 02/20/16 1704    Clinical Impression Statement Patient demonstrates decreased endurance today requiring more rest breaks throughout treatment session. Focused on improving quadriceps strength as patient continues with difficulties ascending stairs and patient will benefit from further skilled therapy focused on improving strength to return to priro level of function.    Rehab Potential Fair   Clinical Impairments Affecting Rehab Potential (+) highly motivated, family support (-) Charcot Massie Maroon, age, previous CVAs,    PT Frequency 2x / week   PT Duration 8 weeks   PT Treatment/Interventions Gait training;Stair training;Therapeutic activities;Therapeutic exercise;Balance training;Neuromuscular re-education;Patient/family education;Moist Heat;Electrical Stimulation   PT Next Visit Plan Progressively light balance training   PT Home Exercise Plan sit to stands, hip abduction, hip adduction   Consulted and Agree with Plan of Care Patient      Patient will benefit from skilled therapeutic intervention in order to improve the following deficits and impairments:  Abnormal gait, Decreased activity tolerance, Decreased balance, Decreased endurance, Decreased range of motion, Decreased strength, Difficulty walking, Impaired flexibility, Pain, Postural dysfunction, Increased muscle spasms, Decreased mobility, Impaired UE functional use  Visit Diagnosis: Muscle weakness (generalized)  Difficulty in walking, not elsewhere classified     Problem List Patient Active Problem List   Diagnosis Date Noted  . OSA (obstructive sleep apnea) 01/02/2016  . Pedal edema  01/02/2016  . Hyponatremia 01/02/2016  . Hyperkalemia 01/02/2016  . Fall in home 12/11/2015  . Poor balance 12/11/2015  . Closed head injury 12/11/2015  . Low back pain with sciatica 10/02/2015  . Right hip pain 10/02/2015  . Hearing loss 04/18/2015  . Encounter for Medicare annual wellness exam 10/04/2013  . DM type 2 (diabetes mellitus, type 2) ([place]) 08/31/2012  . CVA 09/12/2008  . OSTEOARTHRITIS, MULTIPLE JOINTS 05/30/2008  . OSTEOPENIA 05/30/2008  . Vitamin D deficiency 07/14/2007  . SLEEP APNEA 07/14/2007  . Hyperlipidemia 05/26/2007  . Essential hypertension 11/30/2006  .  Asthma 11/30/2006  . H/O gastroesophageal reflux (GERD) 11/30/2006  . G I BLEED 11/30/2006  . DEEP VENOUS THROMBOPHLEBITIS, HX OF 11/30/2006    Blythe Stanford, PT DPT 02/20/2016, 5:07 PM  [place] MAIN [facility] SERVICES [address] [place], [place], [address] Phone: [phone]   Fax:  [phone]  Name: Lydia King MRN: [id number] Date of Birth: 31-Oct-1930

## 2016-02-25 ENCOUNTER — Ambulatory Visit: Payer: Medicare Other

## 2016-02-25 DIAGNOSIS — R262 Difficulty in walking, not elsewhere classified: Secondary | ICD-10-CM

## 2016-02-25 DIAGNOSIS — M6281 Muscle weakness (generalized): Secondary | ICD-10-CM | POA: Diagnosis not present

## 2016-02-25 NOTE — Therapy (Signed)
[place] MAIN [facility] SERVICES [address] [place], [place], [address] Phone: [phone]   Fax:  [phone]  Physical Therapy Treatment  Patient Details  Name: Lydia King MRN: [id number] Date of Birth: [birth year] Referring Provider: Abner Greenspan, MD  Encounter Date: 02/25/2016      PT End of Session - 02/25/16 1603    Visit Number 10   Number of Visits 16   Date for PT Re-Evaluation 03/03/16   Authorization Type 10/10 G code   PT Start Time 1600   PT Stop Time 1645   PT Time Calculation (min) 45 min   Equipment Utilized During Treatment Gait belt   Activity Tolerance Patient tolerated treatment well      Past Medical History:  Diagnosis Date  . Anemia    in past (from blood loss at hiatal hernia)  . Asthma   . Charcot-Marie-Tooth disease    walks with cane (fairly controlled)  . Diverticulosis   . DVT (deep venous thrombosis) ([place]) 2003   after sx  . GERD (gastroesophageal reflux disease)   . Grief reaction    on fluoxetine  . Headache(784.0)    migraine  . Hiatal hernia   . Hyperlipidemia   . Hypertension   . OA (ocular albinism) ([place])   . Osteopenia   . PUD (peptic ulcer disease)    gastric, past  . Shingles   . Stroke ([place]) 8/10   posterior reversible encephalopathy syndrome  . Urine incontinence     Past Surgical History:  Procedure Laterality Date  . carotid dopplers  8/10  . COLONOSCOPY  2002  . HERNIA REPAIR  2002   bleeding  . HIATAL HERNIA REPAIR  2000   needed blood transfusion  . Stroke  8/10   hosp stroke/ posterior (vs posterior reversible encephalopathy syndrome) - d/c on Plavix  . TEE normal  [id number]   no emoblic source  . TOTAL KNEE ARTHROPLASTY  03,06   Bilateral  . VESICOVAGINAL FISTULA CLOSURE W/ TAH  1964   with bleeding and cervical dysplasia    There were no vitals filed for this visit.      Subjective Assessment - 02/25/16 1605    Subjective Patient reports her sleep has  greatly improved since the intial visit, walking with greater ease, and reports she is able to wash off her feet while sitting down, where she was not able to the previous visit.    Patient is accompained by: Family member  daughter   Pertinent History Charcot Massie Maroon, B TKA, CVA 2010 (Had 15 CVAs),    Limitations Lifting;Standing;Walking   How long can you stand comfortably? 5 min   How long can you walk comfortably? 51ft   Patient Stated Goals Improve balance, and improved stamina   Currently in Pain? No/denies   Pain Onset More than a month ago        TREATMENT: Observation:  40mWT: .16m/s; 5xSTS (from treatment table): 14sec; 5xSTS (from standard chair) 23sec; TUG: 20sec Nustep level 2 at seat level 6 - 5 min Hip abduction in standing - 2 x 15 B Cone taps in standing with 2 cones - 2 x 20 Heel raises in standing on airex pad- 2 x 15 Sit to stands - 4 x 5 reps  Wobbleboard with slow and controlled motion - x 2 min Side stepping at airex beam - x 5 down and back  Tandem amb on airex beam with B UE support -  x 5 down and back  Seated Leg press at quantum -  2 x 20 105# with cueing on speed of performance and knee positioning throughout motion Step ups onto double airex pad - x10 B  Closed kinetic chain hip/lumbar ext in standing - x 20 with B UE support       PT Education - 02/25/16 1628    Education provided Yes   Education Details POC; form/technique throughout exercise   Person(s) Educated Patient   Methods Explanation;Demonstration   Comprehension Verbalized understanding;Returned demonstration             PT Long Term Goals - 02/25/16 1611      PT LONG TERM GOAL #1   Title Patient will be able to perform the 38mwt over 1 m/s to demonstrate significant improvement in fall risk while amb in the community.    Baseline 69mwt: .32m/s 02/25/15: .14m/s   Time 8   Period Weeks   Status On-going     PT LONG TERM GOAL #2   Title Patient will be able to perform  5xSTS in under 16sec without use of UEs to demonstrate significant improvement in LE strength and decreased fall risk.   Baseline 5xSTS: 26sec 02/25/15: 14sec   Time 8   Period Weeks   Status Achieved     PT LONG TERM GOAL #3   Title Patient will be able to perform the TUG in under 14sec to demonstrate significant improvement in fall risk and improved ability to amb to the kitchen.    Baseline TUG: 24sec 02/25/16: 20sec   Time 8   Period Weeks   Status On-going     PT LONG TERM GOAL #4   Title Patient will be independent with HEP to demonstrate functional carryover of exercise performance at home after discharge from PT   Baseline dependent for exercise performance and  02/25/16: moderate cueing for technique   Time 8   Period Weeks   Status On-going     PT LONG TERM GOAL #5   Title Patient will be able to perform sit to stands from a standerd height chair in under 16sec to demonstrate significant improvement in funcitonal strength and ability to stand to go to the restroom   Baseline 5xSTS: 23sec   Time 8   Period Weeks   Status New               Plan - 02/25/16 1648    Clinical Impression Statement Patient demonstrates significant improvement in all outcome measures performed; most significantly 5xSTS and 44mWT indicating decreased fall risk and improved functional strength/mobility. Although patient is improving, she continues to demonstrate decreased speed with gait resulting in increased fall risk most noteably for community ambulation. Patient will benefit from further skilled therapy to return to prior level of function.    Rehab Potential Fair   Clinical Impairments Affecting Rehab Potential (+) highly motivated, family support (-) Charcot Massie Maroon, age, previous CVAs,    PT Frequency 2x / week   PT Duration 8 weeks   PT Treatment/Interventions Gait training;Stair training;Therapeutic activities;Therapeutic exercise;Balance training;Neuromuscular  re-education;Patient/family education;Moist Heat;Electrical Stimulation   PT Next Visit Plan Progressively light balance training   PT Home Exercise Plan sit to stands, hip abduction, hip adduction   Consulted and Agree with Plan of Care Patient      Patient will benefit from skilled therapeutic intervention in order to improve the following deficits and impairments:  Abnormal gait, Decreased activity tolerance, Decreased balance, Decreased  endurance, Decreased range of motion, Decreased strength, Difficulty walking, Impaired flexibility, Pain, Postural dysfunction, Increased muscle spasms, Decreased mobility, Impaired UE functional use  Visit Diagnosis: Muscle weakness (generalized) - Plan: PT plan of care cert/re-cert  Difficulty in walking, not elsewhere classified - Plan: PT plan of care cert/re-cert       G-Codes - 03/22/2016 1651    Functional Assessment Tool Used 70mwt, 5xsts, TUG, clinical judgement   Functional Limitation Mobility: Walking and moving around   Mobility: Walking and Moving Around Current Status [id number]) At least 20 percent but less than 40 percent impaired, limited or restricted   Mobility: Walking and Moving Around Goal Status [phone]) At least 1 percent but less than 20 percent impaired, limited or restricted      Problem List Patient Active Problem List   Diagnosis Date Noted  . OSA (obstructive sleep apnea) 01/02/2016  . Pedal edema 01/02/2016  . Hyponatremia 01/02/2016  . Hyperkalemia 01/02/2016  . Fall in home 12/11/2015  . Poor balance 12/11/2015  . Closed head injury 12/11/2015  . Low back pain with sciatica 10/02/2015  . Right hip pain 10/02/2015  . Hearing loss 04/18/2015  . Encounter for Medicare annual wellness exam 10/04/2013  . DM type 2 (diabetes mellitus, type 2) ([place]) 08/31/2012  . CVA 09/12/2008  . OSTEOARTHRITIS, MULTIPLE JOINTS 05/30/2008  . OSTEOPENIA 05/30/2008  . Vitamin D deficiency 07/14/2007  . SLEEP APNEA 07/14/2007  .  Hyperlipidemia 05/26/2007  . Essential hypertension 11/30/2006  . Asthma 11/30/2006  . H/O gastroesophageal reflux (GERD) 11/30/2006  . G I BLEED 11/30/2006  . DEEP VENOUS THROMBOPHLEBITIS, HX OF 11/30/2006    Blythe Stanford, PT DPT 03-22-2016, 4:53 PM  [place] MAIN [facility] SERVICES [address] [place], [place], [address] Phone: [phone]   Fax:  [phone]  Name: Lydia King MRN: [id number] Date of Birth: Feb 03, 1930

## 2016-02-27 ENCOUNTER — Ambulatory Visit: Payer: Medicare Other

## 2016-03-03 ENCOUNTER — Ambulatory Visit: Payer: Medicare Other

## 2016-03-04 ENCOUNTER — Other Ambulatory Visit: Payer: Self-pay | Admitting: Family Medicine

## 2016-03-05 ENCOUNTER — Ambulatory Visit: Payer: Medicare Other

## 2016-03-11 ENCOUNTER — Ambulatory Visit: Payer: Medicare Other | Attending: Family Medicine

## 2016-03-11 DIAGNOSIS — R262 Difficulty in walking, not elsewhere classified: Secondary | ICD-10-CM | POA: Insufficient documentation

## 2016-03-11 DIAGNOSIS — M6281 Muscle weakness (generalized): Secondary | ICD-10-CM | POA: Insufficient documentation

## 2016-03-11 NOTE — Therapy (Signed)
[place] MAIN [facility] SERVICES [address] [place], [place], [address] Phone: [phone]   Fax:  [phone]  Physical Therapy Treatment  Patient Details  Name: Lydia King MRN: [id number] Date of Birth: 1930/10/17 Referring Provider: Abner Greenspan, MD  Encounter Date: 03/11/2016      PT End of Session - 03/11/16 1615    Visit Number 11   Number of Visits 16   Date for PT Re-Evaluation 04/22/16   Authorization Type 1/10 G code   PT Start Time [id number]   PT Stop Time 1645   PT Time Calculation (min) 41 min   Equipment Utilized During Treatment Gait belt   Activity Tolerance Patient tolerated treatment well      Past Medical History:  Diagnosis Date  . Anemia    in past (from blood loss at hiatal hernia)  . Asthma   . Charcot-Marie-Tooth disease    walks with cane (fairly controlled)  . Diverticulosis   . DVT (deep venous thrombosis) ([place]) 2003   after sx  . GERD (gastroesophageal reflux disease)   . Grief reaction    on fluoxetine  . Headache(784.0)    migraine  . Hiatal hernia   . Hyperlipidemia   . Hypertension   . OA (ocular albinism) ([place])   . Osteopenia   . PUD (peptic ulcer disease)    gastric, past  . Shingles   . Stroke ([place]) 8/10   posterior reversible encephalopathy syndrome  . Urine incontinence     Past Surgical History:  Procedure Laterality Date  . carotid dopplers  8/10  . COLONOSCOPY  2002  . HERNIA REPAIR  2002   bleeding  . HIATAL HERNIA REPAIR  2000   needed blood transfusion  . Stroke  8/10   hosp stroke/ posterior (vs posterior reversible encephalopathy syndrome) - d/c on Plavix  . TEE normal  [id number]   no emoblic source  . TOTAL KNEE ARTHROPLASTY  03,06   Bilateral  . VESICOVAGINAL FISTULA CLOSURE W/ TAH  1964   with bleeding and cervical dysplasia    There were no vitals filed for this visit.      Subjective Assessment - 03/11/16 1611    Subjective Patient reports she has not  been attending physical therapy secondary to family member being in the hospital and not having a ride. Patient reports no falls since the previous visit and has been performing HEP.    Patient is accompained by: Family member  daughter   Pertinent History Charcot Massie Maroon, B TKA, CVA 2010 (Had 15 CVAs),    Limitations Lifting;Standing;Walking   How long can you stand comfortably? 5 min   How long can you walk comfortably? 39ft   Patient Stated Goals Improve balance, and improved stamina   Currently in Pain? Yes   Pain Score 3    Pain Location Back   Pain Orientation Lower   Pain Descriptors / Indicators Aching   Pain Onset More than a month ago   Pain Frequency Intermittent      TREATMENT: Nustep level 4 at seat level 6 - 5 min Sit to stands - x 10, x15 reps  Hip adduction with 4# ball with glute squeezes - 2 x 30 Step taps onto 6" step - 2 x 20 with finger tip support throughout motion Closed kinetic chain hip/lumbar ext in standing - x 20 with B UE support Hip abduction in standing on airex pad  - 2 x 15 B  Hip extension in standing on airex pad - 2 x 15 B Semi tandem stance balance on airex pad - 2 x 1 min B EC Wobbleboard with slow and controlled motion - 2 x 2 min intermittent UE support      PT Education - 03/11/16 1614    Education provided Yes   Education Details Educated on form/technique throhgout exercise   Person(s) Educated Patient   Methods Explanation;Demonstration   Comprehension Verbalized understanding;Returned demonstration             PT Long Term Goals - 03/11/16 1612      PT LONG TERM GOAL #1   Title Patient will be able to perform the 36mwt over 1 m/s to demonstrate significant improvement in fall risk while amb in the community.    Baseline 73mwt: .58m/s 02/25/15: .68m/s   Time 8   Period Weeks   Status On-going     PT LONG TERM GOAL #2   Title Patient will be able to perform 5xSTS in under 16sec without use of UEs to demonstrate  significant improvement in LE strength and decreased fall risk.   Baseline 5xSTS: 26sec 02/25/15: 14sec   Time 8   Period Weeks   Status Achieved     PT LONG TERM GOAL #3   Title Patient will be able to perform the TUG in under 14sec to demonstrate significant improvement in fall risk and improved ability to amb to the kitchen.    Baseline TUG: 24sec 02/25/16: 20sec   Time 8   Period Weeks   Status On-going     PT LONG TERM GOAL #4   Title Patient will be independent with HEP to demonstrate functional carryover of exercise performance at home after discharge from PT   Baseline dependent for exercise performance and  02/25/16: moderate cueing for technique   Time 8   Period Weeks   Status On-going     PT LONG TERM GOAL #5   Title Patient will be able to perform sit to stands from a standerd height chair in under 16sec to demonstrate significant improvement in funcitonal strength and ability to stand to go to the restroom   Baseline 5xSTS: 23sec   Time 8   Period Weeks   Status New               Plan - 03/11/16 1636    Clinical Impression Statement Patient required only 1 sitting rest break throughout session today indicating functional improvement. Last visit patient demonstrated singificant improvement in all outcome measures performed such as 5xSTS, 4mWT indicating improvement in functional capacity. Although patient is improving she continues to demonstrate decreased gait speed and LE strength and will benefit from further skilled therapy to return to prior level of function.    Rehab Potential Fair   Clinical Impairments Affecting Rehab Potential (+) highly motivated, family support (-) Charcot Massie Maroon, age, previous CVAs,    PT Frequency 2x / week   PT Duration 8 weeks   PT Treatment/Interventions Gait training;Stair training;Therapeutic activities;Therapeutic exercise;Balance training;Neuromuscular re-education;Patient/family education;Moist Heat;Electrical  Stimulation   PT Next Visit Plan Progressively light balance training   PT Home Exercise Plan sit to stands, hip abduction, hip adduction   Consulted and Agree with Plan of Care Patient      Patient will benefit from skilled therapeutic intervention in order to improve the following deficits and impairments:  Abnormal gait, Decreased activity tolerance, Decreased balance, Decreased endurance, Decreased range of motion, Decreased strength, Difficulty walking,  Impaired flexibility, Pain, Postural dysfunction, Increased muscle spasms, Decreased mobility, Impaired UE functional use  Visit Diagnosis: Muscle weakness (generalized)  Difficulty in walking, not elsewhere classified     Problem List Patient Active Problem List   Diagnosis Date Noted  . OSA (obstructive sleep apnea) 01/02/2016  . Pedal edema 01/02/2016  . Hyponatremia 01/02/2016  . Hyperkalemia 01/02/2016  . Fall in home 12/11/2015  . Poor balance 12/11/2015  . Closed head injury 12/11/2015  . Low back pain with sciatica 10/02/2015  . Right hip pain 10/02/2015  . Hearing loss 04/18/2015  . Encounter for Medicare annual wellness exam 10/04/2013  . DM type 2 (diabetes mellitus, type 2) ([place]) 08/31/2012  . CVA 09/12/2008  . OSTEOARTHRITIS, MULTIPLE JOINTS 05/30/2008  . OSTEOPENIA 05/30/2008  . Vitamin D deficiency 07/14/2007  . SLEEP APNEA 07/14/2007  . Hyperlipidemia 05/26/2007  . Essential hypertension 11/30/2006  . Asthma 11/30/2006  . H/O gastroesophageal reflux (GERD) 11/30/2006  . G I BLEED 11/30/2006  . DEEP VENOUS THROMBOPHLEBITIS, HX OF 11/30/2006    Blythe Stanford, PT DPT 03/11/2016, 4:41 PM  [place] MAIN [facility] SERVICES [address] [place], [place], [address] Phone: [phone]   Fax:  [phone]  Name: NISHI BASSETTE MRN: [id number] Date of Birth: 21-Jul-1930

## 2016-03-17 ENCOUNTER — Ambulatory Visit: Payer: Medicare Other

## 2016-03-19 ENCOUNTER — Ambulatory Visit: Payer: Medicare Other

## 2016-03-19 DIAGNOSIS — R262 Difficulty in walking, not elsewhere classified: Secondary | ICD-10-CM | POA: Diagnosis not present

## 2016-03-19 DIAGNOSIS — M6281 Muscle weakness (generalized): Secondary | ICD-10-CM

## 2016-03-19 NOTE — Therapy (Signed)
[place] MAIN [facility] SERVICES [address] [place], [place], [address] Phone: [phone]   Fax:  [phone]  Physical Therapy Treatment  Patient Details  Name: Lydia King MRN: [id number] Date of Birth: 11/02/1930 Referring Provider: Abner Greenspan, MD  Encounter Date: 03/19/2016      PT End of Session - 03/19/16 1608    Visit Number 12   Number of Visits 28   Date for PT Re-Evaluation 04/22/16   Authorization Type 2/10 G code   PT Start Time 1603   PT Stop Time 1645   PT Time Calculation (min) 42 min   Equipment Utilized During Treatment Gait belt   Activity Tolerance Patient tolerated treatment well      Past Medical History:  Diagnosis Date  . Anemia    in past (from blood loss at hiatal hernia)  . Asthma   . Charcot-Marie-Tooth disease    walks with cane (fairly controlled)  . Diverticulosis   . DVT (deep venous thrombosis) ([place]) 2003   after sx  . GERD (gastroesophageal reflux disease)   . Grief reaction    on fluoxetine  . Headache(784.0)    migraine  . Hiatal hernia   . Hyperlipidemia   . Hypertension   . OA (ocular albinism) ([place])   . Osteopenia   . PUD (peptic ulcer disease)    gastric, past  . Shingles   . Stroke ([place]) 8/10   posterior reversible encephalopathy syndrome  . Urine incontinence     Past Surgical History:  Procedure Laterality Date  . carotid dopplers  8/10  . COLONOSCOPY  2002  . HERNIA REPAIR  2002   bleeding  . HIATAL HERNIA REPAIR  2000   needed blood transfusion  . Stroke  8/10   hosp stroke/ posterior (vs posterior reversible encephalopathy syndrome) - d/c on Plavix  . TEE normal  [id number]   no emoblic source  . TOTAL KNEE ARTHROPLASTY  03,06   Bilateral  . VESICOVAGINAL FISTULA CLOSURE W/ TAH  1964   with bleeding and cervical dysplasia    There were no vitals filed for this visit.      Subjective Assessment - 03/19/16 1606    Subjective Patient reports her back pain  has been increased over the past few days and states it feels much better today. Patient reports she would not like to perform many exercises in standing today.    Patient is accompained by: Family member  daughter   Pertinent History Charcot Massie Maroon, B TKA, CVA 2010 (Had 15 CVAs),    Limitations Lifting;Standing;Walking   How long can you stand comfortably? 5 min   How long can you walk comfortably? 58ft   Patient Stated Goals Improve balance, and improved stamina   Currently in Pain? Yes   Pain Score 3    Pain Location Back   Pain Orientation Lower   Pain Descriptors / Indicators Aching   Pain Type Acute pain   Pain Onset More than a month ago   Pain Frequency Intermittent      TREATMENT: Nustep level 4 at seat level 6 - 5 min Sit to stands - x 10, x15 reps with pad on top of the seat Hip adduction with 4# ball with glute squeezes - 3 x 15 Seated hip abduction with GTB - 2 x 30  Seated Marches with GTB - 2 x 30  Seated hamstring curls with GTB - 2 x 15 Manually resisted leg press  in sitting - 2 x 15 Closed kinetic chain hip/lumbar ext in standing - x 20 with B UE support UE support mini squats  in  bars - 2 x 15 Hip abduction in standing on airex pad  - 2 x 15 B       PT Education - 03/19/16 1607    Education provided Yes   Education Details Form/technique with exercise throughout session   Person(s) Educated Patient   Methods Explanation;Demonstration   Comprehension Verbalized understanding;Returned demonstration             PT Long Term Goals - 03/11/16 1612      PT LONG TERM GOAL #1   Title Patient will be able to perform the 91mwt over 1 m/s to demonstrate significant improvement in fall risk while amb in the community.    Baseline 9mwt: .75m/s 02/25/15: .35m/s   Time 8   Period Weeks   Status On-going     PT LONG TERM GOAL #2   Title Patient will be able to perform 5xSTS in under 16sec without use of UEs to demonstrate significant improvement in  LE strength and decreased fall risk.   Baseline 5xSTS: 26sec 02/25/15: 14sec   Time 8   Period Weeks   Status Achieved     PT LONG TERM GOAL #3   Title Patient will be able to perform the TUG in under 14sec to demonstrate significant improvement in fall risk and improved ability to amb to the kitchen.    Baseline TUG: 24sec 02/25/16: 20sec   Time 8   Period Weeks   Status On-going     PT LONG TERM GOAL #4   Title Patient will be independent with HEP to demonstrate functional carryover of exercise performance at home after discharge from PT   Baseline dependent for exercise performance and  02/25/16: moderate cueing for technique   Time 8   Period Weeks   Status On-going     PT LONG TERM GOAL #5   Title Patient will be able to perform sit to stands from a standerd height chair in under 16sec to demonstrate significant improvement in funcitonal strength and ability to stand to go to the restroom   Baseline 5xSTS: 23sec   Time 8   Period Weeks   Status New               Plan - 03/19/16 1622    Clinical Impression Statement Patient demonstrates increased weakness today; requiring an elevated cushion to perform sit to stand exercises. Patient reports she has not been as active secondary to low back pain. Educated patient on importance of movement during treatment session and patient will benefit from further skilled therapy to decrease fall risk.    Rehab Potential Fair   Clinical Impairments Affecting Rehab Potential (+) highly motivated, family support (-) Charcot Massie Maroon, age, previous CVAs,    PT Frequency 2x / week   PT Duration 8 weeks   PT Treatment/Interventions Gait training;Stair training;Therapeutic activities;Therapeutic exercise;Balance training;Neuromuscular re-education;Patient/family education;Moist Heat;Electrical Stimulation   PT Next Visit Plan Progressively light balance training   PT Home Exercise Plan sit to stands, hip abduction, hip adduction    Consulted and Agree with Plan of Care Patient      Patient will benefit from skilled therapeutic intervention in order to improve the following deficits and impairments:  Abnormal gait, Decreased activity tolerance, Decreased balance, Decreased endurance, Decreased range of motion, Decreased strength, Difficulty walking, Impaired flexibility, Pain, Postural dysfunction, Increased  muscle spasms, Decreased mobility, Impaired UE functional use  Visit Diagnosis: Muscle weakness (generalized)  Difficulty in walking, not elsewhere classified     Problem List Patient Active Problem List   Diagnosis Date Noted  . OSA (obstructive sleep apnea) 01/02/2016  . Pedal edema 01/02/2016  . Hyponatremia 01/02/2016  . Hyperkalemia 01/02/2016  . Fall in home 12/11/2015  . Poor balance 12/11/2015  . Closed head injury 12/11/2015  . Low back pain with sciatica 10/02/2015  . Right hip pain 10/02/2015  . Hearing loss 04/18/2015  . Encounter for Medicare annual wellness exam 10/04/2013  . DM type 2 (diabetes mellitus, type 2) ([place]) 08/31/2012  . CVA 09/12/2008  . OSTEOARTHRITIS, MULTIPLE JOINTS 05/30/2008  . OSTEOPENIA 05/30/2008  . Vitamin D deficiency 07/14/2007  . SLEEP APNEA 07/14/2007  . Hyperlipidemia 05/26/2007  . Essential hypertension 11/30/2006  . Asthma 11/30/2006  . H/O gastroesophageal reflux (GERD) 11/30/2006  . G I BLEED 11/30/2006  . DEEP VENOUS THROMBOPHLEBITIS, HX OF 11/30/2006    Blythe Stanford, PT DPT 03/19/2016, 4:48 PM  [place] MAIN [facility] SERVICES [address] [place], [place], [address] Phone: [phone]   Fax:  [phone]  Name: Lydia King MRN: [id number] Date of Birth: 24-Feb-1930

## 2016-03-27 ENCOUNTER — Ambulatory Visit: Payer: Medicare Other

## 2016-04-07 ENCOUNTER — Other Ambulatory Visit: Payer: Medicare Other

## 2016-04-14 ENCOUNTER — Telehealth: Payer: Self-pay | Admitting: Family Medicine

## 2016-04-14 NOTE — Telephone Encounter (Signed)
Pt will call back when her daughters husband is out of hospital. Pt daughter takes her to appts.

## 2016-04-16 ENCOUNTER — Ambulatory Visit: Payer: Medicare Other | Admitting: Family Medicine

## 2016-04-20 ENCOUNTER — Other Ambulatory Visit: Payer: Self-pay | Admitting: Family Medicine

## 2016-04-21 NOTE — Telephone Encounter (Signed)
Last OV was 01/02/16, please advise

## 2016-04-21 NOTE — Telephone Encounter (Signed)
Will refill electronically  

## 2016-04-22 ENCOUNTER — Other Ambulatory Visit: Payer: Self-pay | Admitting: Family Medicine

## 2016-04-23 ENCOUNTER — Other Ambulatory Visit: Payer: Self-pay | Admitting: Family Medicine

## 2016-04-30 ENCOUNTER — Telehealth: Payer: Self-pay | Admitting: *Deleted

## 2016-04-30 NOTE — Telephone Encounter (Signed)
PA for flexeril was done at [email or web address], I will await a response

## 2016-05-01 ENCOUNTER — Other Ambulatory Visit: Payer: Self-pay | Admitting: *Deleted

## 2016-05-01 NOTE — Telephone Encounter (Signed)
PA was approved, pharmacy was notified, and approval letter placed in Dr. Marliss Coots inbox to sign and send for scanning

## 2016-05-16 ENCOUNTER — Encounter: Payer: Self-pay | Admitting: Family Medicine

## 2016-05-19 NOTE — Telephone Encounter (Signed)
Letter mailed

## 2016-05-19 NOTE — Telephone Encounter (Signed)
Please mail letter in IN box Thanks

## 2016-05-28 NOTE — Telephone Encounter (Signed)
Spoke to pt, she will have her daughter call the office and schedule awv and cpe.

## 2016-07-01 ENCOUNTER — Other Ambulatory Visit: Payer: Self-pay | Admitting: Family Medicine

## 2016-07-10 ENCOUNTER — Other Ambulatory Visit: Payer: Self-pay | Admitting: Family Medicine

## 2016-07-14 ENCOUNTER — Telehealth: Payer: Self-pay | Admitting: Family Medicine

## 2016-07-14 ENCOUNTER — Ambulatory Visit (INDEPENDENT_AMBULATORY_CARE_PROVIDER_SITE_OTHER): Payer: Medicare Other

## 2016-07-14 ENCOUNTER — Ambulatory Visit: Payer: Medicare Other

## 2016-07-14 VITALS — BP 100/64 | HR 76 | Temp 97.8°F | Ht <= 58 in | Wt 171.2 lb

## 2016-07-14 DIAGNOSIS — E11618 Type 2 diabetes mellitus with other diabetic arthropathy: Secondary | ICD-10-CM

## 2016-07-14 DIAGNOSIS — E559 Vitamin D deficiency, unspecified: Secondary | ICD-10-CM

## 2016-07-14 DIAGNOSIS — Z0000 Encounter for general adult medical examination without abnormal findings: Secondary | ICD-10-CM

## 2016-07-14 DIAGNOSIS — I10 Essential (primary) hypertension: Secondary | ICD-10-CM | POA: Diagnosis not present

## 2016-07-14 LAB — CBC WITH DIFFERENTIAL/PLATELET
BASOS ABS: 0 10*3/uL (ref 0.0–0.1)
Basophils Relative: 0.3 % (ref 0.0–3.0)
EOS ABS: 0.1 10*3/uL (ref 0.0–0.7)
Eosinophils Relative: 1.6 % (ref 0.0–5.0)
HCT: 40.1 % (ref 36.0–46.0)
Hemoglobin: 13.1 g/dL (ref 12.0–15.0)
LYMPHS ABS: 2.2 10*3/uL (ref 0.7–4.0)
Lymphocytes Relative: 36.5 % (ref 12.0–46.0)
MCHC: 32.7 g/dL (ref 30.0–36.0)
MCV: 88.8 fl (ref 78.0–100.0)
Monocytes Absolute: 0.4 10*3/uL (ref 0.1–1.0)
Monocytes Relative: 6 % (ref 3.0–12.0)
Neutro Abs: 3.4 10*3/uL (ref 1.4–7.7)
Neutrophils Relative %: 55.6 % (ref 43.0–77.0)
PLATELETS: 197 10*3/uL (ref 150.0–400.0)
RBC: 4.52 Mil/uL (ref 3.87–5.11)
RDW: 13.9 % (ref 11.5–15.5)
WBC: 6.1 10*3/uL (ref 4.0–10.5)

## 2016-07-14 LAB — COMPREHENSIVE METABOLIC PANEL
ALT: 10 U/L (ref 0–35)
AST: 12 U/L (ref 0–37)
Albumin: 3.9 g/dL (ref 3.5–5.2)
Alkaline Phosphatase: 78 U/L (ref 39–117)
BUN: 22 mg/dL (ref 6–23)
CHLORIDE: 100 meq/L (ref 96–112)
CO2: 31 meq/L (ref 19–32)
Calcium: 9.7 mg/dL (ref 8.4–10.5)
Creatinine, Ser: 0.78 mg/dL (ref 0.40–1.20)
GFR: 74.8 mL/min (ref >60.00)
Glucose, Bld: 109 mg/dL — ABNORMAL HIGH (ref 70–99)
Potassium: 4.2 mEq/L (ref 3.5–5.1)
Sodium: 138 mEq/L (ref 135–145)
Total Bilirubin: 0.3 mg/dL (ref 0.2–1.2)
Total Protein: 6.6 g/dL (ref 6.0–8.3)

## 2016-07-14 LAB — LIPID PANEL
CHOL/HDL RATIO: 3
Cholesterol: 169 mg/dL (ref 0–200)
HDL: 53.2 mg/dL (ref >39.00)
NONHDL: 115.59
TRIGLYCERIDES: 222 mg/dL — AB (ref 0.0–149.0)
VLDL: 44.4 mg/dL — AB (ref 0.0–40.0)

## 2016-07-14 LAB — VITAMIN D 25 HYDROXY (VIT D DEFICIENCY, FRACTURES): VITD: 61.18 ng/mL (ref 30.00–100.00)

## 2016-07-14 LAB — TSH: TSH: 1.95 u[IU]/mL (ref 0.35–4.50)

## 2016-07-14 LAB — HEMOGLOBIN A1C: Hgb A1c MFr Bld: 7.5 % — ABNORMAL HIGH (ref 4.6–6.5)

## 2016-07-14 LAB — LDL CHOLESTEROL, DIRECT: LDL DIRECT: 78 mg/dL

## 2016-07-14 NOTE — Progress Notes (Signed)
Subjective:   RHONDALYN CLINGAN is a 83 y.o. female who presents for Medicare Annual (Subsequent) preventive examination.  Review of Systems:  N/A Cardiac Risk Factors include: advanced age (>34men, >30 women);dyslipidemia;obesity (BMI >30kg/m2);hypertension;diabetes mellitus     Objective:     Vitals: BP 100/64 (BP Location: Right Arm, Patient Position: Sitting, Cuff Size: Large)   Pulse 76   Temp 97.8 F (36.6 C) (Oral)   Ht 4\' 9"  (1.448 m) Comment: no shoes  Wt 171 lb 4 oz (77.7 kg)   SpO2 94%   BMI 37.06 kg/m   Body mass index is 37.06 kg/m.   Tobacco History  Smoking Status  . Never Smoker  Smokeless Tobacco  . Never Used     Counseling given: No   Past Medical History:  Diagnosis Date  . Anemia    in past (from blood loss at hiatal hernia)  . Asthma   . Charcot-Marie-Tooth disease    walks with cane (fairly controlled)  . Diverticulosis   . DVT (deep venous thrombosis) ([place]) 2003   after sx  . GERD (gastroesophageal reflux disease)   . Grief reaction    on fluoxetine  . Headache(784.0)    migraine  . Hiatal hernia   . Hyperlipidemia   . Hypertension   . OA (ocular albinism) ([place])   . Osteopenia   . PUD (peptic ulcer disease)    gastric, past  . Shingles   . Stroke ([place]) 8/10   posterior reversible encephalopathy syndrome  . Urine incontinence    Past Surgical History:  Procedure Laterality Date  . carotid dopplers  8/10  . COLONOSCOPY  2002  . HERNIA REPAIR  2002   bleeding  . HIATAL HERNIA REPAIR  2000   needed blood transfusion  . Stroke  8/10   hosp stroke/ posterior (vs posterior reversible encephalopathy syndrome) - d/c on Plavix  . TEE normal  6/19   no emoblic source  . TOTAL KNEE ARTHROPLASTY  03,06   Bilateral  . VESICOVAGINAL FISTULA CLOSURE W/ TAH  1964   with bleeding and cervical dysplasia   Family History  Problem Relation Age of Onset  . Cerebral aneurysm Son   . Cerebral aneurysm Father   . Hyperlipidemia Father    . Alcohol abuse Father   . Stroke Mother   . Cervical cancer Sister   . Hyperlipidemia Brother   . Alcohol abuse Brother   . Stroke Brother   . Diabetes Brother   . Diabetes Sister   . Sarcoidosis Sister   . Other Sister        OP/ Broken hip  . Other Unknown        whole family has charcot marie tooth  . Ovarian cancer Sister    History  Sexual Activity  . Sexual activity: No    Outpatient Encounter Prescriptions as of 07/14/2016  Medication Sig  . acetaminophen (TYLENOL) 500 MG tablet Take 500 mg by mouth every 8 (eight) hours as needed.   . Cholecalciferol (VITAMIN D) 2000 UNITS CAPS Take 2 capsules by mouth daily.    . clopidogrel (PLAVIX) 75 MG tablet TAKE 1 TABLET (75 MG TOTAL) BY MOUTH DAILY.  . cyclobenzaprine (FLEXERIL) 10 MG tablet TAKE 1/2 TO 1 TABLET BY MOUTH 3 TIMES A DAY AS NEEDED FOR MUSCLE SPASMS(WATCH FOR SEDATION AND FALLS  . esomeprazole (NEXIUM) 40 MG capsule TAKE 1 CAPSULE (40 MG TOTAL) BY MOUTH DAILY BEFORE BREAKFAST.  Marland Kitchen Flaxseed, Linseed, (FLAX SEED OIL)  1000 MG CAPS Take 1 capsule by mouth daily.    Marland Kitchen FLUoxetine (PROZAC) 20 MG capsule TAKE ONE CAPSULE BY MOUTH EVERY DAY  . furosemide (LASIX) 20 MG tablet TAKE 2 TABLETS (40 MG TOTAL) BY MOUTH DAILY.  Marland Kitchen losartan (COZAAR) 100 MG tablet TAKE 1/2 TAB BY MOUTH ONCE A DAY  . metFORMIN (GLUCOPHAGE) 500 MG tablet TAKE 1 TABLET BY MOUTH TWICE A DAY WITH MEALS  . Misc. Devices (WALKER) MISC Wheeled walker with seat- Pt must use for ambulation-- for use as directed: ambulation dx: 729.5, 780.79, 786.05, 784.0, 715.89.   Marland Kitchen omega-3 acid ethyl esters (LOVAZA) 1 g capsule TAKE 1 CAPSULE (1 G TOTAL) BY MOUTH DAILY.  Marland Kitchen PROAIR HFA 108 (90 Base) MCG/ACT inhaler INHALE 2 PUFFS INTO THE LUNGS EVERY 4 (FOUR) HOURS AS NEEDED FOR WHEEZING OR SHORTNESS OF BREATH.  . rosuvastatin (CRESTOR) 10 MG tablet TAKE 1 TABLET BY MOUTH AT BEDTIME   No facility-administered encounter medications on file as of 07/14/2016.     Activities of  Daily Living In your present state of health, do you have any difficulty performing the following activities: 07/14/2016  Hearing? Y  Vision? Y  Difficulty concentrating or making decisions? Y  Walking or climbing stairs? N  Dressing or bathing? Y  Doing errands, shopping? Y  Preparing Food and eating ? Y  Using the Toilet? Y  In the past six months, have you accidently leaked urine? N  Do you have problems with loss of bowel control? N  Managing your Medications? Y  Managing your Finances? Y  Housekeeping or managing your Housekeeping? Y  Some recent data might be hidden    Patient Care Team: Tower, Wynelle Fanny, MD as PCP - General    Assessment:     Hearing Screening   125Hz  250Hz  500Hz  1000Hz  2000Hz  3000Hz  4000Hz  6000Hz  8000Hz   Right ear:   40 40 40  0    Left ear:   0 0 40  0    Vision Screening Comments: Last vision exam in April 2018   Exercise Activities and Dietary recommendations Current Exercise Habits: Home exercise routine, Type of exercise: Other - see comments (physical therapy exercises), Time (Minutes): 30, Frequency (Times/Week): 3, Weekly Exercise (Minutes/Week): 90, Intensity: Mild, Exercise limited by: None identified  Goals    . Increase physical activity          Starting 07/14/2016, I will continue to exercise for 15 min 4 days per week.       Fall Risk Fall Risk  07/14/2016 04/16/2015 10/04/2013 11/19/2011  Falls in the past year? No Yes Yes -  Number falls in past yr: - 1 1 -  Injury with Fall? - No No -  Risk for fall due to : - Impaired balance/gait;Impaired mobility;Impaired vision;History of fall(s) - Impaired mobility;Impaired vision  Risk for fall due to (comments): - - - has supervision and uses a walker in and out of the home ,ha  Follow up - Falls evaluation completed - -   Depression Screen PHQ 2/9 Scores 07/14/2016 04/16/2015 10/04/2013 11/19/2011  PHQ - 2 Score 0 0 0 0     Cognitive Function MMSE - Mini Mental State Exam 07/14/2016  04/16/2015  Orientation to time 5 5  Orientation to Place 5 5  Registration 3 3  Attention/ Calculation 0 3  Recall 3 3  Language- name 2 objects 0 0  Language- repeat 1 1  Language- follow 3 step command 3 3  Language-  read & follow direction 0 0  Write a sentence 0 0  Copy design 0 0  Total score 20 23       PLEASE NOTE: A Mini-Cog screen was completed. Maximum score is 20. A value of 0 denotes this part of [place] MMSE was not completed or the patient failed this part of the Mini-Cog screening.   Mini-Cog Screening Orientation to Time - Max 5 pts Orientation to Place - Max 5 pts Registration - Max 3 pts Recall - Max 3 pts Language Repeat - Max 1 pts Language Follow 3 Step Command - Max 3 pts   Immunization History  Administered Date(s) Administered  . Influenza Split 11/22/2010, 11/18/2011  . Influenza-Unspecified 10/27/2012, 10/27/2013  . Pneumococcal Conjugate-13 10/04/2013  . Pneumococcal Polysaccharide-23 01/27/2005  . Td 05/26/2007   Screening Tests Health Maintenance  Topic Date Due  . DEXA SCAN  07/14/2048 (Originally 08/19/1997)  . FOOT EXAM  01/01/2017  . HEMOGLOBIN A1C  01/13/2017  . OPHTHALMOLOGY EXAM  05/11/2017  . TETANUS/TDAP  05/25/2017  . PNA vac Low Risk Adult  Completed      Plan:     I have personally reviewed and addressed the Medicare Annual Wellness questionnaire and have noted the following in the patient's chart:  A. Medical and social history B. Use of alcohol, tobacco or illicit drugs  C. Current medications and supplements D. Functional ability and status E.  Nutritional status F.  Physical activity G. Advance directives H. List of other physicians I.  Hospitalizations, surgeries, and ER visits in previous 12 months J.  Dale to include hearing, vision, cognitive, depression L. Referrals and appointments - none  In addition, I have reviewed and discussed with patient certain preventive protocols, quality metrics,  and best practice recommendations. A written personalized care plan for preventive services as well as general preventive health recommendations were provided to patient.  See attached scanned questionnaire for additional information.   Signed,   Lindell Noe, MHA, BS, LPN Health Coach

## 2016-07-14 NOTE — Patient Instructions (Signed)
Lydia King , Thank you for taking time to come for your Medicare Wellness Visit. I appreciate your ongoing commitment to your health goals. Please review the following plan we discussed and let me know if I can assist you in the future.   These are the goals we discussed: Goals    . Increase physical activity          Starting 07/14/2016, I will continue to exercise for 15 min 4 days per week.        This is a list of the screening recommended for you and due dates:  Health Maintenance  Topic Date Due  . DEXA scan (bone density measurement)  07/14/2048*  . Complete foot exam   01/01/2017  . Hemoglobin A1C  01/13/2017  . Eye exam for diabetics  05/11/2017  . Tetanus Vaccine  05/25/2017  . Pneumonia vaccines  Completed  *Topic was postponed. The date shown is not the original due date.   Preventive Care for Adults  A healthy lifestyle and preventive care can promote health and wellness. Preventive health guidelines for adults include the following key practices.  . A routine yearly physical is a good way to check with your health care provider about your health and preventive screening. It is a chance to share any concerns and updates on your health and to receive a thorough exam.  . Visit your dentist for a routine exam and preventive care every 6 months. Brush your teeth twice a day and floss once a day. Good oral hygiene prevents tooth decay and gum disease.  . The frequency of eye exams is based on your age, health, family medical history, use  of contact lenses, and other factors. Follow your health care provider's ecommendations for frequency of eye exams.  . Eat a healthy diet. Foods like vegetables, fruits, whole grains, low-fat dairy products, and lean protein foods contain the nutrients you need without too many calories. Decrease your intake of foods high in solid fats, added sugars, and salt. Eat the right amount of calories for you. Get information about a proper diet  from your health care provider, if necessary.  . Regular physical exercise is one of the most important things you can do for your health. Most adults should get at least 150 minutes of moderate-intensity exercise (any activity that increases your heart rate and causes you to sweat) each week. In addition, most adults need muscle-strengthening exercises on 2 or more days a week.  Silver Sneakers may be a benefit available to you. To determine eligibility, you may visit the website: www.silversneakers.com or contact program at [phone] Mon-Fri between 8AM-8PM.   . Maintain a healthy weight. The body mass index (BMI) is a screening tool to identify possible weight problems. It provides an estimate of body fat based on height and weight. Your health care provider can find your BMI and can help you achieve or maintain a healthy weight.   For adults 20 years and older: ? A BMI below 18.5 is considered underweight. ? A BMI of 18.5 to 24.9 is normal. ? A BMI of 25 to 29.9 is considered overweight. ? A BMI of 30 and above is considered obese.   . Maintain normal blood lipids and cholesterol levels by exercising and minimizing your intake of saturated fat. Eat a balanced diet with plenty of fruit and vegetables. Blood tests for lipids and cholesterol should begin at age 5 and be repeated every 5 years. If your lipid or cholesterol levels  are high, you are over 50, or you are at high risk for heart disease, you may need your cholesterol levels checked more frequently. Ongoing high lipid and cholesterol levels should be treated with medicines if diet and exercise are not working.  . If you smoke, find out from your health care provider how to quit. If you do not use tobacco, please do not start.  . If you choose to drink alcohol, please do not consume more than 2 drinks per day. One drink is considered to be 12 ounces (355 mL) of beer, 5 ounces (148 mL) of wine, or 1.5 ounces (44 mL) of liquor.  .  If you are 50-70 years old, ask your health care provider if you should take aspirin to prevent strokes.  . Use sunscreen. Apply sunscreen liberally and repeatedly throughout the day. You should seek shade when your shadow is shorter than you. Protect yourself by wearing long sleeves, pants, a wide-brimmed hat, and sunglasses year round, whenever you are outdoors.  . Once a month, do a whole body skin exam, using a mirror to look at the skin on your back. Tell your health care provider of new moles, moles that have irregular borders, moles that are larger than a pencil eraser, or moles that have changed in shape or color.

## 2016-07-14 NOTE — Progress Notes (Signed)
Pre visit review using our clinic review tool, if applicable. No additional management support is needed unless otherwise documented below in the visit note. 

## 2016-07-14 NOTE — Progress Notes (Signed)
PCP notes:   Health maintenance:  A1C - completed Eye exam - per pt, exam in April 2018  Abnormal screenings:   Hearing - failed  Patient concerns:   None  Nurse concerns:  None  Next PCP appt:   07/18/16 @ 6606  I reviewed health advisor's note, was available for consultation, and agree with documentation and plan. Loura Pardon MD

## 2016-07-14 NOTE — Telephone Encounter (Signed)
-----   Message from Eustace Pen, LPN sent at 5/80/0634 12:46 PM EDT ----- Regarding: Labs 6/18 Please place lab orders.  Per HM, pt needs A1C.  Traditional Medicare

## 2016-07-16 ENCOUNTER — Encounter: Payer: Self-pay | Admitting: *Deleted

## 2016-07-16 ENCOUNTER — Emergency Department: Payer: Medicare Other

## 2016-07-16 DIAGNOSIS — Y939 Activity, unspecified: Secondary | ICD-10-CM | POA: Diagnosis not present

## 2016-07-16 DIAGNOSIS — Y999 Unspecified external cause status: Secondary | ICD-10-CM | POA: Insufficient documentation

## 2016-07-16 DIAGNOSIS — Z79899 Other long term (current) drug therapy: Secondary | ICD-10-CM | POA: Diagnosis not present

## 2016-07-16 DIAGNOSIS — I10 Essential (primary) hypertension: Secondary | ICD-10-CM | POA: Diagnosis not present

## 2016-07-16 DIAGNOSIS — E119 Type 2 diabetes mellitus without complications: Secondary | ICD-10-CM | POA: Diagnosis not present

## 2016-07-16 DIAGNOSIS — W19XXXA Unspecified fall, initial encounter: Secondary | ICD-10-CM | POA: Diagnosis not present

## 2016-07-16 DIAGNOSIS — Z7902 Long term (current) use of antithrombotics/antiplatelets: Secondary | ICD-10-CM | POA: Diagnosis not present

## 2016-07-16 DIAGNOSIS — S0083XA Contusion of other part of head, initial encounter: Secondary | ICD-10-CM | POA: Insufficient documentation

## 2016-07-16 DIAGNOSIS — S0990XA Unspecified injury of head, initial encounter: Secondary | ICD-10-CM | POA: Diagnosis not present

## 2016-07-16 DIAGNOSIS — Z8673 Personal history of transient ischemic attack (TIA), and cerebral infarction without residual deficits: Secondary | ICD-10-CM | POA: Diagnosis not present

## 2016-07-16 DIAGNOSIS — Y92009 Unspecified place in unspecified non-institutional (private) residence as the place of occurrence of the external cause: Secondary | ICD-10-CM | POA: Diagnosis not present

## 2016-07-16 DIAGNOSIS — S199XXA Unspecified injury of neck, initial encounter: Secondary | ICD-10-CM | POA: Diagnosis not present

## 2016-07-16 DIAGNOSIS — J45909 Unspecified asthma, uncomplicated: Secondary | ICD-10-CM | POA: Insufficient documentation

## 2016-07-16 DIAGNOSIS — Z96653 Presence of artificial knee joint, bilateral: Secondary | ICD-10-CM | POA: Diagnosis not present

## 2016-07-16 NOTE — ED Triage Notes (Signed)
Pt to triage via wheelchair.  Pt stumbled and fell in the bedroom tonight.  Pt struck the bed frame.  No loc.  No vomiting.  Pt has hematoma to left forehead. Pt is on coumadin  Hx cva.  Pt alert.  Speech clear.

## 2016-07-17 ENCOUNTER — Telehealth: Payer: Self-pay

## 2016-07-17 ENCOUNTER — Emergency Department
Admission: EM | Admit: 2016-07-17 | Discharge: 2016-07-17 | Disposition: A | Discharge status: Home / Self Care | Payer: Medicare Other | Attending: Emergency Medicine | Admitting: Emergency Medicine

## 2016-07-17 DIAGNOSIS — W19XXXA Unspecified fall, initial encounter: Secondary | ICD-10-CM

## 2016-07-17 DIAGNOSIS — S0990XA Unspecified injury of head, initial encounter: Secondary | ICD-10-CM

## 2016-07-17 DIAGNOSIS — Y92009 Unspecified place in unspecified non-institutional (private) residence as the place of occurrence of the external cause: Secondary | ICD-10-CM

## 2016-07-17 NOTE — ED Notes (Signed)

## 2016-07-17 NOTE — ED Provider Notes (Signed)
[facility] Emergency Department Provider Note  ____________________________________________  Time seen: Approximately 2:05 AM  I have reviewed the triage vital signs and the nursing notes.   HISTORY  Chief Complaint Fall    HPI Lydia King is a 83 y.o. female who complains of left forehead pain after a mechanical fall at home. She reports she has frequent falls. Contrary to the triage note she is not on Coumadin but does take Plavix which I confirmed with the patient. She had to be helped back to her feet after the fall, but after that was totally fine and ambulatory. She denies any complaints at all. No neck pain. No vision changes numbness tingling or weakness.     Past Medical History:  Diagnosis Date  . Anemia    in past (from blood loss at hiatal hernia)  . Asthma   . Charcot-Marie-Tooth disease    walks with cane (fairly controlled)  . Diverticulosis   . DVT (deep venous thrombosis) ([place]) 2003   after sx  . GERD (gastroesophageal reflux disease)   . Grief reaction    on fluoxetine  . Headache(784.0)    migraine  . Hiatal hernia   . Hyperlipidemia   . Hypertension   . OA (ocular albinism) ([place])   . Osteopenia   . PUD (peptic ulcer disease)    gastric, past  . Shingles   . Stroke ([place]) 8/10   posterior reversible encephalopathy syndrome  . Urine incontinence      Patient Active Problem List   Diagnosis Date Noted  . OSA (obstructive sleep apnea) 01/02/2016  . Pedal edema 01/02/2016  . Hyponatremia 01/02/2016  . Hyperkalemia 01/02/2016  . Fall in home 12/11/2015  . Poor balance 12/11/2015  . Closed head injury 12/11/2015  . Low back pain with sciatica 10/02/2015  . Right hip pain 10/02/2015  . Hearing loss 04/18/2015  . Encounter for Medicare annual wellness exam 10/04/2013  . DM type 2 (diabetes mellitus, type 2) ([place]) 08/31/2012  . CVA 09/12/2008  . OSTEOARTHRITIS, MULTIPLE JOINTS 05/30/2008  . OSTEOPENIA 05/30/2008   . Vitamin D deficiency 07/14/2007  . SLEEP APNEA 07/14/2007  . Hyperlipidemia 05/26/2007  . Essential hypertension 11/30/2006  . Asthma 11/30/2006  . H/O gastroesophageal reflux (GERD) 11/30/2006  . G I BLEED 11/30/2006  . DEEP VENOUS THROMBOPHLEBITIS, HX OF 11/30/2006     Past Surgical History:  Procedure Laterality Date  . carotid dopplers  8/10  . COLONOSCOPY  2002  . HERNIA REPAIR  2002   bleeding  . HIATAL HERNIA REPAIR  2000   needed blood transfusion  . Stroke  8/10   hosp stroke/ posterior (vs posterior reversible encephalopathy syndrome) - d/c on Plavix  . TEE normal  2/37   no emoblic source  . TOTAL KNEE ARTHROPLASTY  03,06   Bilateral  . VESICOVAGINAL FISTULA CLOSURE W/ TAH  1964   with bleeding and cervical dysplasia     Prior to Admission medications   Medication Sig Start Date End Date Taking? Authorizing Provider  acetaminophen (TYLENOL) 500 MG tablet Take 500 mg by mouth every 8 (eight) hours as needed.     [provider]  Cholecalciferol (VITAMIN D) 2000 UNITS CAPS Take 2 capsules by mouth daily.      [provider]  clopidogrel (PLAVIX) 75 MG tablet TAKE 1 TABLET (75 MG TOTAL) BY MOUTH DAILY. 04/23/16   Tower, Wynelle Fanny, MD  cyclobenzaprine (FLEXERIL) 10 MG tablet TAKE 1/2 TO 1 TABLET  BY MOUTH 3 TIMES A DAY AS NEEDED FOR MUSCLE SPASMS(WATCH FOR SEDATION AND FALLS 04/21/16   Tower, Wynelle Fanny, MD  esomeprazole (NEXIUM) 40 MG capsule TAKE 1 CAPSULE (40 MG TOTAL) BY MOUTH DAILY BEFORE BREAKFAST. 04/22/16   Tower, Marne A, MD  Flaxseed, Linseed, (FLAX SEED OIL) 1000 MG CAPS Take 1 capsule by mouth daily.      [provider]  FLUoxetine (PROZAC) 20 MG capsule TAKE ONE CAPSULE BY MOUTH EVERY DAY 10/30/15   Tower, Wynelle Fanny, MD  furosemide (LASIX) 20 MG tablet TAKE 2 TABLETS (40 MG TOTAL) BY MOUTH DAILY. 04/21/16   Tower, Wynelle Fanny, MD  losartan (COZAAR) 100 MG tablet TAKE 1/2 TAB BY MOUTH ONCE A DAY 07/10/16   Tower, Wynelle Fanny, MD  metFORMIN  (GLUCOPHAGE) 500 MG tablet TAKE 1 TABLET BY MOUTH TWICE A DAY WITH MEALS 04/21/16   Tower, Wynelle Fanny, MD  Misc. Devices (WALKER) MISC Wheeled walker with seat- Pt must use for ambulation-- for use as directed: ambulation dx: 729.5, 780.79, 786.05, 784.0, 715.89.     [provider]  omega-3 acid ethyl esters (LOVAZA) 1 g capsule TAKE 1 CAPSULE (1 G TOTAL) BY MOUTH DAILY. 07/02/16   Tower, Wynelle Fanny, MD  PROAIR HFA 108 [phone] Base) MCG/ACT inhaler INHALE 2 PUFFS INTO THE LUNGS EVERY 4 (FOUR) HOURS AS NEEDED FOR WHEEZING OR SHORTNESS OF BREATH. 03/05/16   Tower, Wynelle Fanny, MD  rosuvastatin (CRESTOR) 10 MG tablet TAKE 1 TABLET BY MOUTH AT BEDTIME 07/10/16   Tower, Wynelle Fanny, MD     Allergies Shrimp [shellfish allergy]; Amlodipine besylate; Atorvastatin; Ciprofloxacin; Codeine; Influenza vaccines; Nitrofurantoin; Omeprazole; Pantoprazole sodium; Penicillins; Prednisone; Simvastatin; and Sulfonamide derivatives   Family History  Problem Relation Age of Onset  . Cerebral aneurysm Son   . Cerebral aneurysm Father   . Hyperlipidemia Father   . Alcohol abuse Father   . Stroke Mother   . Cervical cancer Sister   . Hyperlipidemia Brother   . Alcohol abuse Brother   . Stroke Brother   . Diabetes Brother   . Diabetes Sister   . Sarcoidosis Sister   . Other Sister        OP/ Broken hip  . Other Unknown        whole family has charcot marie tooth  . Ovarian cancer Sister     Social History Social History  Substance Use Topics  . Smoking status: Never Smoker  . Smokeless tobacco: Never Used  . Alcohol use No    Review of Systems  Constitutional:   No fever or chills.  ENT:   No sore throat. No rhinorrhea. Cardiovascular:   No chest pain or syncope. Respiratory:   No dyspnea or cough. Gastrointestinal:   Negative for abdominal pain, vomiting and diarrhea.  Musculoskeletal:   Negative for focal pain or swelling All other systems reviewed and are negative except as documented above in ROS and  HPI.  ____________________________________________   PHYSICAL EXAM:  VITAL SIGNS: ED Triage Vitals  Enc Vitals Group     BP 07/16/16 2300 (!) 156/85     Pulse Rate 07/16/16 2300 74     Resp 07/16/16 2300 20     Temp 07/16/16 2300 98.2 F (36.8 C)     Temp Source 07/16/16 2300 Oral     SpO2 07/16/16 2300 99 %     Weight 07/16/16 2302 171 lb (77.6 kg)     Height 07/16/16 2302 4\' 9"  (1.448 m)  Head Circumference --      Peak Flow --      Pain Score --      Pain Loc --      Pain Edu? --      Excl. in [place]? --     Vital signs reviewed, nursing assessments reviewed.   Constitutional:   Alert and oriented. Well appearing and in no distress. Eyes:   No scleral icterus.  EOMI. No nystagmus. No conjunctival pallor. PERRL. ENT   Head:   Normocephalic and atraumatic. Tenderness over the left supraorbital ridge without ecchymosis or crepitus or other changes. No hemotympanum bilaterally.   Nose:   No congestion/rhinnorhea.    Mouth/Throat:   MMM, no pharyngeal erythema. No peritonsillar mass.    Neck:   No meningismus. Full ROM Hematological/Lymphatic/Immunilogical:   No cervical lymphadenopathy. Cardiovascular:   RRR. Symmetric bilateral radial and DP pulses.  No murmurs.  Respiratory:   Normal respiratory effort without tachypnea/retractions. Breath sounds are clear and equal bilaterally. No wheezes/rales/rhonchi.  Musculoskeletal:   Normal range of motion in all extremities. No joint effusions.  No lower extremity tenderness.  No edema. Neurologic:   Normal speech and language.  Motor grossly intact. No gross focal neurologic deficits are appreciated.  Skin:    Skin is warm, dry and intact. No rash noted.  No petechiae, purpura, or bullae.  ____________________________________________    LABS (pertinent positives/negatives) (all labs ordered are listed, but only abnormal results are displayed) Labs Reviewed - No data to  display ____________________________________________   EKG    ____________________________________________    RADIOLOGY  Ct Head Wo Contrast  Result Date: 07/16/2016 CLINICAL DATA:  83 year old female with fall. EXAM: CT HEAD WITHOUT CONTRAST CT CERVICAL SPINE WITHOUT CONTRAST TECHNIQUE: Multidetector CT imaging of the head and cervical spine was performed following the standard protocol without intravenous contrast. Multiplanar CT image reconstructions of the cervical spine were also generated. COMPARISON:  Head CT dated 11/07/2015 FINDINGS: CT HEAD FINDINGS Brain: There are areas of old infarct and encephalomalacia involving the occipital lobes and cerebral hemispheres bilaterally. There is no acute intracranial hemorrhage. No mass effect or midline shift noted. No extra-axial fluid collection. Vascular: No hyperdense vessel or unexpected calcification. Skull: Normal. Negative for fracture or focal lesion. Sinuses/Orbits: No acute finding. Other: None CT CERVICAL SPINE FINDINGS Alignment: No acute subluxation. Grade 1 C3-C4 and C7-T1 anterolisthesis. Skull base and vertebrae: No acute fracture.  Osteopenia. Soft tissues and spinal canal: No prevertebral fluid or swelling. No visible canal hematoma. Disc levels: Multilevel degenerative changes. There are multilevel facet hypertrophy. Upper chest: The visualized upper lungs are clear. Other: Partially visualized soft tissue density in the region of the right shoulder likely related to synovial thickening/ hypertrophy. MRI may provide better evaluation if clinically indicated. IMPRESSION: 1. No acute intracranial hemorrhage. 2. Age-related atrophy and chronic microvascular ischemic changes. Areas of old infarct involving the occipital lobes and cerebellar hemispheres. 3. No acute/traumatic cervical spine pathology. Electronically Signed   By: Anner Crete M.D.   On: 07/16/2016 23:48   Ct Cervical Spine Wo Contrast  Result Date:  07/16/2016 CLINICAL DATA:  83 year old female with fall. EXAM: CT HEAD WITHOUT CONTRAST CT CERVICAL SPINE WITHOUT CONTRAST TECHNIQUE: Multidetector CT imaging of the head and cervical spine was performed following the standard protocol without intravenous contrast. Multiplanar CT image reconstructions of the cervical spine were also generated. COMPARISON:  Head CT dated 11/07/2015 FINDINGS: CT HEAD FINDINGS Brain: There are areas of old infarct and encephalomalacia involving  the occipital lobes and cerebral hemispheres bilaterally. There is no acute intracranial hemorrhage. No mass effect or midline shift noted. No extra-axial fluid collection. Vascular: No hyperdense vessel or unexpected calcification. Skull: Normal. Negative for fracture or focal lesion. Sinuses/Orbits: No acute finding. Other: None CT CERVICAL SPINE FINDINGS Alignment: No acute subluxation. Grade 1 C3-C4 and C7-T1 anterolisthesis. Skull base and vertebrae: No acute fracture.  Osteopenia. Soft tissues and spinal canal: No prevertebral fluid or swelling. No visible canal hematoma. Disc levels: Multilevel degenerative changes. There are multilevel facet hypertrophy. Upper chest: The visualized upper lungs are clear. Other: Partially visualized soft tissue density in the region of the right shoulder likely related to synovial thickening/ hypertrophy. MRI may provide better evaluation if clinically indicated. IMPRESSION: 1. No acute intracranial hemorrhage. 2. Age-related atrophy and chronic microvascular ischemic changes. Areas of old infarct involving the occipital lobes and cerebellar hemispheres. 3. No acute/traumatic cervical spine pathology. Electronically Signed   By: Anner Crete M.D.   On: 07/16/2016 23:48    ____________________________________________   PROCEDURES Procedures  ____________________________________________   INITIAL IMPRESSION / ASSESSMENT AND PLAN / ED COURSE  Pertinent labs & imaging results that were  available during my care of the patient were reviewed by me and considered in my medical decision making (see chart for details).  Patient well appearing no acute distress, unremarkable vital signs presents with left forehead pain after a mechanical fall at home. Has some evidence of superficial contusion in the area but no significant injuries. CT is negative for intracranial hemorrhage or skull fracture. Discharge home follow up with primary care in 2 days at scheduled appointment.      ____________________________________________   FINAL CLINICAL IMPRESSION(S) / ED DIAGNOSES  Final diagnoses:  Injury of head, initial encounter  Fall in home, initial encounter      New Prescriptions   No medications on file     Portions of this note were generated with dragon dictation software. Dictation errors may occur despite best attempts at proofreading.    Carrie Mew, MD 07/17/16 [phone]

## 2016-07-17 NOTE — Discharge Instructions (Signed)
Results for orders placed or performed in visit on 07/14/16  CBC with Differential/Platelet  Result Value Ref Range   WBC 6.1 4.0 - 10.5 K/uL   RBC 4.52 3.87 - 5.11 Mil/uL   Hemoglobin 13.1 12.0 - 15.0 g/dL   HCT 40.1 36.0 - 46.0 %   MCV 88.8 78.0 - 100.0 fl   MCHC 32.7 30.0 - 36.0 g/dL   RDW 13.9 11.5 - 15.5 %   Platelets 197.0 150.0 - 400.0 K/uL   Neutrophils Relative % 55.6 43.0 - 77.0 %   Lymphocytes Relative 36.5 12.0 - 46.0 %   Monocytes Relative 6.0 3.0 - 12.0 %   Eosinophils Relative 1.6 0.0 - 5.0 %   Basophils Relative 0.3 0.0 - 3.0 %   Neutro Abs 3.4 1.4 - 7.7 K/uL   Lymphs Abs 2.2 0.7 - 4.0 K/uL   Monocytes Absolute 0.4 0.1 - 1.0 K/uL   Eosinophils Absolute 0.1 0.0 - 0.7 K/uL   Basophils Absolute 0.0 0.0 - 0.1 K/uL  Comprehensive metabolic panel  Result Value Ref Range   Sodium 138 135 - 145 mEq/L   Potassium 4.2 3.5 - 5.1 mEq/L   Chloride 100 96 - 112 mEq/L   CO2 31 19 - 32 mEq/L   Glucose, Bld 109 (H) 70 - 99 mg/dL   BUN 22 6 - 23 mg/dL   Creatinine, Ser 0.78 0.40 - 1.20 mg/dL   Total Bilirubin 0.3 0.2 - 1.2 mg/dL   Alkaline Phosphatase 78 39 - 117 U/L   AST 12 0 - 37 U/L   ALT 10 0 - 35 U/L   Total Protein 6.6 6.0 - 8.3 g/dL   Albumin 3.9 3.5 - 5.2 g/dL   Calcium 9.7 8.4 - 10.5 mg/dL   GFR 74.80 >60.00 mL/min  Hemoglobin A1c  Result Value Ref Range   Hgb A1c MFr Bld 7.5 (H) 4.6 - 6.5 %  Lipid panel  Result Value Ref Range   Cholesterol 169 0 - 200 mg/dL   Triglycerides 222.0 (H) 0.0 - 149.0 mg/dL   HDL 53.20 >39.00 mg/dL   VLDL 44.4 (H) 0.0 - 40.0 mg/dL   Total CHOL/HDL Ratio 3    NonHDL 115.59   TSH  Result Value Ref Range   TSH 1.95 0.35 - 4.50 uIU/mL  VITAMIN D 25 Hydroxy (Vit-D Deficiency, Fractures)  Result Value Ref Range   VITD 61.18 30.00 - 100.00 ng/mL  LDL cholesterol, direct  Result Value Ref Range   Direct LDL 78.0 mg/dL   Ct Head Wo Contrast  Result Date: 07/16/2016 CLINICAL DATA:  83 year old female with fall. EXAM: CT HEAD  WITHOUT CONTRAST CT CERVICAL SPINE WITHOUT CONTRAST TECHNIQUE: Multidetector CT imaging of the head and cervical spine was performed following the standard protocol without intravenous contrast. Multiplanar CT image reconstructions of the cervical spine were also generated. COMPARISON:  Head CT dated 11/07/2015 FINDINGS: CT HEAD FINDINGS Brain: There are areas of old infarct and encephalomalacia involving the occipital lobes and cerebral hemispheres bilaterally. There is no acute intracranial hemorrhage. No mass effect or midline shift noted. No extra-axial fluid collection. Vascular: No hyperdense vessel or unexpected calcification. Skull: Normal. Negative for fracture or focal lesion. Sinuses/Orbits: No acute finding. Other: None CT CERVICAL SPINE FINDINGS Alignment: No acute subluxation. Grade 1 C3-C4 and C7-T1 anterolisthesis. Skull base and vertebrae: No acute fracture.  Osteopenia. Soft tissues and spinal canal: No prevertebral fluid or swelling. No visible canal hematoma. Disc levels: Multilevel degenerative changes. There are multilevel facet hypertrophy.  Upper chest: The visualized upper lungs are clear. Other: Partially visualized soft tissue density in the region of the right shoulder likely related to synovial thickening/ hypertrophy. MRI may provide better evaluation if clinically indicated. IMPRESSION: 1. No acute intracranial hemorrhage. 2. Age-related atrophy and chronic microvascular ischemic changes. Areas of old infarct involving the occipital lobes and cerebellar hemispheres. 3. No acute/traumatic cervical spine pathology. Electronically Signed   By: Anner Crete M.D.   On: 07/16/2016 23:48   Ct Cervical Spine Wo Contrast  Result Date: 07/16/2016 CLINICAL DATA:  83 year old female with fall. EXAM: CT HEAD WITHOUT CONTRAST CT CERVICAL SPINE WITHOUT CONTRAST TECHNIQUE: Multidetector CT imaging of the head and cervical spine was performed following the standard protocol without intravenous  contrast. Multiplanar CT image reconstructions of the cervical spine were also generated. COMPARISON:  Head CT dated 11/07/2015 FINDINGS: CT HEAD FINDINGS Brain: There are areas of old infarct and encephalomalacia involving the occipital lobes and cerebral hemispheres bilaterally. There is no acute intracranial hemorrhage. No mass effect or midline shift noted. No extra-axial fluid collection. Vascular: No hyperdense vessel or unexpected calcification. Skull: Normal. Negative for fracture or focal lesion. Sinuses/Orbits: No acute finding. Other: None CT CERVICAL SPINE FINDINGS Alignment: No acute subluxation. Grade 1 C3-C4 and C7-T1 anterolisthesis. Skull base and vertebrae: No acute fracture.  Osteopenia. Soft tissues and spinal canal: No prevertebral fluid or swelling. No visible canal hematoma. Disc levels: Multilevel degenerative changes. There are multilevel facet hypertrophy. Upper chest: The visualized upper lungs are clear. Other: Partially visualized soft tissue density in the region of the right shoulder likely related to synovial thickening/ hypertrophy. MRI may provide better evaluation if clinically indicated. IMPRESSION: 1. No acute intracranial hemorrhage. 2. Age-related atrophy and chronic microvascular ischemic changes. Areas of old infarct involving the occipital lobes and cerebellar hemispheres. 3. No acute/traumatic cervical spine pathology. Electronically Signed   By: Anner Crete M.D.   On: 07/16/2016 23:48

## 2016-07-17 NOTE — Telephone Encounter (Signed)
Per chart review tab pt went to [facility] ED. Pt has an annual exam already scheduled with Dr Glori Bickers on 07/18/16 at 3:15.

## 2016-07-17 NOTE — Telephone Encounter (Signed)
PLEASE NOTE: All timestamps contained within this report are represented as [place] Standard Time. CONFIDENTIALTY NOTICE: This fax transmission is intended only for the addressee. It contains information that is legally privileged, confidential or otherwise protected from use or disclosure. If you are not the intended recipient, you are strictly prohibited from reviewing, disclosing, copying using or disseminating any of this information or taking any action in reliance on or regarding this information. If you have received this fax in error, please notify us immediately by telephone so that we can arrange for its return to [place]. Phone: [phone], Toll-Free: [phone], Fax: [phone] Page: 1 of 1 Call Id: [id number] [place] Patient Name: Lydia King Gender: Female DOB: 22-Jul-1930 Age: 83 Y 10 M 28 D Return Phone Number: [phone] (Primary), [phone] (Secondary) City/State/Zip: [place] [address] Client [facility] [place] [facility] - Client Client Site [facility] Physician Glori Bickers, Roque Lias - MD Who Is Calling Patient / Member / Family / Caregiver Call Type Triage / Clinical Caller Name Thompson Caul Relationship To Patient Daughter Return Phone Number [phone] (Primary) Chief Complaint Head Injury (non urgent symptom) Reason for Call Symptomatic / Request for Health Information Initial Comment caller states mother had several strokes in 2010, she is on blood thinners. today she fell face forward on the bed frame. the dr advised if she did have a fall and hit her head to check for bleeding in the brain. Appointment Disposition EMR Appointment Not Necessary Info pasted into Epic No Nurse Assessment Nurse: Tawanna Solo, RN, Vaughan Basta Date/Time (Eastern Time): 07/16/2016 10:26:42 PM Confirm and document reason for call. If symptomatic, describe  symptoms. ---Daughter says mother is on blood thinner. She fell face forward and her forehead hit the metal bed frame. Had swelling, dtr applied ice. Has lump the size of 50 cent piece on her forehead. Does the PT have any chronic conditions? (i.e. diabetes, asthma, etc.) ---Yes List chronic conditions. ---Hx of CVA Chronic coumadin use HTN DM Guidelines Guideline Title Affirmed Question Head Injury Taking Coumadin (warfarin) or other strong blood thinner, or known bleeding disorder (e.g., thrombocytopenia) Disp. Time Eilene Ghazi Time) Disposition Final User 07/16/2016 10:34:46 PM Go to ED Now (or PCP triage) Yes Tawanna Solo, RN, [facility] - ED Care Advice Given Per Guideline GO TO ED NOW (OR PCP TRIAGE): CARE ADVICE given per Head Injury (Adult) guideline.

## 2016-07-18 ENCOUNTER — Other Ambulatory Visit: Payer: Self-pay | Admitting: Family Medicine

## 2016-07-18 ENCOUNTER — Ambulatory Visit (INDEPENDENT_AMBULATORY_CARE_PROVIDER_SITE_OTHER): Payer: Medicare Other | Admitting: Family Medicine

## 2016-07-18 ENCOUNTER — Encounter: Payer: Self-pay | Admitting: Family Medicine

## 2016-07-18 ENCOUNTER — Ambulatory Visit: Payer: Medicare Other | Admitting: Family Medicine

## 2016-07-18 VITALS — BP 122/62 | HR 91 | Temp 98.1°F | Ht <= 58 in | Wt 175.5 lb

## 2016-07-18 DIAGNOSIS — E7800 Pure hypercholesterolemia, unspecified: Secondary | ICD-10-CM | POA: Diagnosis not present

## 2016-07-18 DIAGNOSIS — I10 Essential (primary) hypertension: Secondary | ICD-10-CM

## 2016-07-18 DIAGNOSIS — E559 Vitamin D deficiency, unspecified: Secondary | ICD-10-CM

## 2016-07-18 DIAGNOSIS — S0990XD Unspecified injury of head, subsequent encounter: Secondary | ICD-10-CM

## 2016-07-18 DIAGNOSIS — M8580 Other specified disorders of bone density and structure, unspecified site: Secondary | ICD-10-CM

## 2016-07-18 DIAGNOSIS — E11618 Type 2 diabetes mellitus with other diabetic arthropathy: Secondary | ICD-10-CM | POA: Diagnosis not present

## 2016-07-18 DIAGNOSIS — Y92009 Unspecified place in unspecified non-institutional (private) residence as the place of occurrence of the external cause: Secondary | ICD-10-CM | POA: Diagnosis not present

## 2016-07-18 DIAGNOSIS — W19XXXD Unspecified fall, subsequent encounter: Secondary | ICD-10-CM

## 2016-07-18 MED ORDER — LOSARTAN POTASSIUM 100 MG PO TABS: ORAL_TABLET | ORAL | 3 refills | Status: DC

## 2016-07-18 MED ORDER — ESOMEPRAZOLE MAGNESIUM 40 MG PO CPDR
40.0000 mg | DELAYED_RELEASE_CAPSULE | Freq: Every day | ORAL | 3 refills | Status: DC
Start: 2016-07-18 — End: 2017-07-29

## 2016-07-18 MED ORDER — METFORMIN HCL 1000 MG PO TABS: 1000.0000 mg | ORAL_TABLET | Freq: Two times a day (BID) | ORAL | 3 refills | Status: DC

## 2016-07-18 MED ORDER — CYCLOBENZAPRINE HCL 10 MG PO TABS: ORAL_TABLET | ORAL | 1 refills | Status: DC

## 2016-07-18 MED ORDER — FLUOXETINE HCL 20 MG PO CAPS: 20.0000 mg | ORAL_CAPSULE | Freq: Every day | ORAL | 3 refills | Status: DC

## 2016-07-18 MED ORDER — FUROSEMIDE 20 MG PO TABS: 40.0000 mg | ORAL_TABLET | Freq: Every day | ORAL | 3 refills | Status: DC

## 2016-07-18 MED ORDER — CLOPIDOGREL BISULFATE 75 MG PO TABS: 75.0000 mg | ORAL_TABLET | Freq: Every day | ORAL | 3 refills | Status: DC

## 2016-07-18 NOTE — Patient Instructions (Addendum)
Please begin to use a walker at all times ! -especially for changes in position and getting into bed   Let's go up on metformin to 1000 mg twice daily  If any side effects or problems let me know   Schedule lab appt in 3 months for diabetes    Keep working on healthy diet and exercise

## 2016-07-18 NOTE — Progress Notes (Signed)
Subjective:    Patient ID: Lydia King, female    DOB: [birth year], 83 y.o.   MRN: [id number]  HPI Here for annual f/u of chronic medical problems   amw was 6/18 Hearing is abn in L ear primarily (she has ringing in her ear) Does not think she can afford a hearing aide   Wt Readings from Last 3 Encounters:  07/18/16 175 lb 8 oz (79.6 kg)  07/16/16 171 lb (77.6 kg)  07/14/16 171 lb 4 oz (77.7 kg)  drinking lots of fluids this am  Diet: -trying to cut back on sweets / fried foods / bread  Had exercise program- while in chair (every other day)--using bands  bmi 37.9  Bone density-not interested  Has had falls/no fractures  Vit D level 61.1  Eye exam 4/18  Recent fall with ED visit CT of head and neck are ok  She is feeling better-no HA or neck pain or n/v or dizziness She fell- when she started to take a step - L side is weaker    Colonoscopy 02 Declines colon cancer screening   Mammogram nl 09- declines breast cancer screening  Self breast exam   bp is stable today -doing very well  No cp or palpitations or headaches or edema  No side effects to medicines  BP Readings from Last 3 Encounters:  07/18/16 122/62  07/17/16 128/74  07/14/16 100/64     DM2 Lab Results  Component Value Date   HGBA1C 7.5 (H) 07/14/2016  this is down from 8.3 She was on prednisone then   She checks glucose sporatically 140s in the ams Eating the best she can eat  Open to inc metformin to 1000    Chemistry      Component Value Date/Time   NA 138 07/14/2016 1506   K 4.2 07/14/2016 1506   CL 100 07/14/2016 1506   CO2 31 07/14/2016 1506   BUN 22 07/14/2016 1506   CREATININE 0.78 07/14/2016 1506      Component Value Date/Time   CALCIUM 9.7 07/14/2016 1506   ALKPHOS 78 07/14/2016 1506   AST 12 07/14/2016 1506   ALT 10 07/14/2016 1506   BILITOT 0.3 07/14/2016 1506       Hyperlipidemia Lab Results  Component Value Date   CHOL 169 07/14/2016   CHOL 161 04/16/2015   CHOL 181 09/05/2013   Lab Results  Component Value Date   HDL 53.20 07/14/2016   HDL 58.30 04/16/2015   HDL 55.30 09/05/2013   Lab Results  Component Value Date   LDLCALC 80 04/16/2015   LDLCALC 108 (H) 09/05/2013   LDLCALC 104 (H) 04/20/2013   Lab Results  Component Value Date   TRIG 222.0 (H) 07/14/2016   TRIG 118.0 04/16/2015   TRIG 90.0 09/05/2013   Lab Results  Component Value Date   CHOLHDL 3 07/14/2016   CHOLHDL 3 04/16/2015   CHOLHDL 3 09/05/2013   Lab Results  Component Value Date   LDLDIRECT 78.0 07/14/2016   LDLDIRECT 237.4 07/14/2008   LDLDIRECT 237.6 11/01/2007   Tolerating small dose of crestor    Patient Active Problem List   Diagnosis Date Noted  . OSA (obstructive sleep apnea) 01/02/2016  . Pedal edema 01/02/2016  . Hyponatremia 01/02/2016  . Hyperkalemia 01/02/2016  . Fall in home 12/11/2015  . Poor balance 12/11/2015  . Closed head injury 12/11/2015  . Low back pain with sciatica 10/02/2015  . Right hip pain 10/02/2015  . Hearing loss  04/18/2015  . Encounter for Medicare annual wellness exam 10/04/2013  . DM type 2 (diabetes mellitus, type 2) ([place]) 08/31/2012  . CVA 09/12/2008  . OSTEOARTHRITIS, MULTIPLE JOINTS 05/30/2008  . Osteopenia 05/30/2008  . Vitamin D deficiency 07/14/2007  . SLEEP APNEA 07/14/2007  . Hyperlipidemia 05/26/2007  . Essential hypertension 11/30/2006  . Asthma 11/30/2006  . H/O gastroesophageal reflux (GERD) 11/30/2006  . G I BLEED 11/30/2006  . DEEP VENOUS THROMBOPHLEBITIS, HX OF 11/30/2006   Past Medical History:  Diagnosis Date  . Anemia    in past (from blood loss at hiatal hernia)  . Asthma   . Charcot-Marie-Tooth disease    walks with cane (fairly controlled)  . Diverticulosis   . DVT (deep venous thrombosis) ([place]) 2003   after sx  . GERD (gastroesophageal reflux disease)   . Grief reaction    on fluoxetine  . Headache(784.0)    migraine  . Hiatal hernia   . Hyperlipidemia   . Hypertension   .  OA (ocular albinism) ([place])   . Osteopenia   . PUD (peptic ulcer disease)    gastric, past  . Shingles   . Stroke ([place]) 8/10   posterior reversible encephalopathy syndrome  . Urine incontinence    Past Surgical History:  Procedure Laterality Date  . carotid dopplers  8/10  . COLONOSCOPY  2002  . HERNIA REPAIR  2002   bleeding  . HIATAL HERNIA REPAIR  2000   needed blood transfusion  . Stroke  8/10   hosp stroke/ posterior (vs posterior reversible encephalopathy syndrome) - d/c on Plavix  . TEE normal  5/40   no emoblic source  . TOTAL KNEE ARTHROPLASTY  03,06   Bilateral  . VESICOVAGINAL FISTULA CLOSURE W/ TAH  1964   with bleeding and cervical dysplasia   Social History  Substance Use Topics  . Smoking status: Never Smoker  . Smokeless tobacco: Never Used  . Alcohol use No   Family History  Problem Relation Age of Onset  . Cerebral aneurysm Son   . Cerebral aneurysm Father   . Hyperlipidemia Father   . Alcohol abuse Father   . Stroke Mother   . Cervical cancer Sister   . Hyperlipidemia Brother   . Alcohol abuse Brother   . Stroke Brother   . Diabetes Brother   . Diabetes Sister   . Sarcoidosis Sister   . Other Sister        OP/ Broken hip  . Other Unknown        whole family has charcot marie tooth  . Ovarian cancer Sister    Allergies  Allergen Reactions  . Shrimp [Shellfish Allergy] Anaphylaxis    Anaphylaxis-swelling of throat/sob    . Amlodipine Besylate     REACTION: edema  . Atorvastatin     REACTION: pain- could not walk  . Ciprofloxacin     REACTION: unkown  . Codeine     REACTION: itchy rash  . Influenza Vaccines Other (See Comments)    Arm swelling, redness, chills, fever  . Nitrofurantoin   . Omeprazole     REACTION: not effective  . Pantoprazole Sodium     REACTION: does not work well  . Penicillins     REACTION: itchy rash  . Prednisone     REACTION: RASH.// SHORTNESS OF BREATH  . Simvastatin     REACTION: leg pain  .  Sulfonamide Derivatives     REACTION: itchy rash   Current Outpatient Prescriptions on  File Prior to Visit  Medication Sig Dispense Refill  . acetaminophen (TYLENOL) 500 MG tablet Take 500 mg by mouth every 8 (eight) hours as needed.     . Cholecalciferol (VITAMIN D) 2000 UNITS CAPS Take 2 capsules by mouth daily.      . Flaxseed, Linseed, (FLAX SEED OIL) 1000 MG CAPS Take 1 capsule by mouth daily.      . Misc. Devices (WALKER) MISC Wheeled walker with seat- Pt must use for ambulation-- for use as directed: ambulation dx: 729.5, 780.79, 786.05, 784.0, 715.89.     Marland Kitchen omega-3 acid ethyl esters (LOVAZA) 1 g capsule TAKE 1 CAPSULE (1 G TOTAL) BY MOUTH DAILY. 90 capsule 0  . PROAIR HFA 108 (90 Base) MCG/ACT inhaler INHALE 2 PUFFS INTO THE LUNGS EVERY 4 (FOUR) HOURS AS NEEDED FOR WHEEZING OR SHORTNESS OF BREATH. 8.5 g 3  . rosuvastatin (CRESTOR) 10 MG tablet TAKE 1 TABLET BY MOUTH AT BEDTIME 90 tablet 0   No current facility-administered medications on file prior to visit.     Review of Systems Review of Systems  Constitutional: Negative for fever, appetite change, fatigue and unexpected weight change.  Eyes: Negative for pain and visual disturbance.  Respiratory: Negative for cough and shortness of breath.   Cardiovascular: Negative for cp or palpitations    Gastrointestinal: Negative for nausea, diarrhea and constipation.  Genitourinary: Negative for urgency and frequency.  Skin: Negative for pallor or rash   MSK pos for OA pain and impaired mobility Neurological: Negative for , light-headedness, numbness and headaches. pos for weakness from prev strokes Hematological: Negative for adenopathy. Does not bruise/bleed easily.  Psychiatric/Behavioral: Negative for dysphoric mood. The patient is not nervous/anxious.         Objective:   Physical Exam  Constitutional: She appears well-developed and well-nourished. No distress.  obese and well appearing  In chair/cannot get on table   HENT:    Head: Normocephalic.  Right Ear: External ear normal.  Left Ear: External ear normal.  Nose: Nose normal.  Mouth/Throat: Oropharynx is clear and moist.  Bruise on L side of upper forehead-mildly tender  Eyes: Conjunctivae and EOM are normal. Pupils are equal, round, and reactive to light. Right eye exhibits no discharge. Left eye exhibits no discharge. No scleral icterus.  Neck: Normal range of motion. Neck supple. No JVD present. Carotid bruit is not present. No thyromegaly present.  Cardiovascular: Normal rate, regular rhythm, normal heart sounds and intact distal pulses.  Exam reveals no gallop.   Pulmonary/Chest: Effort normal and breath sounds normal. No respiratory distress. She has no wheezes. She has no rales.  Abdominal: Soft. Bowel sounds are normal. She exhibits no distension and no mass. There is no tenderness.  Musculoskeletal: She exhibits no edema or tenderness.  OA changes of extremities  Mobility impaired-needs cane and asst to ambulate Cannot get on table   Lymphadenopathy:    She has no cervical adenopathy.  Neurological: She is alert. She has normal reflexes. No cranial nerve deficit. She exhibits normal muscle tone. Coordination normal.  Baseline L sided weakness from cva  Skin: Skin is warm and dry. No rash noted. No erythema. No pallor.  Solar lentigines diffusely   Psychiatric: She has a normal mood and affect.          Assessment & Plan:   Problem List Items Addressed This Visit      Cardiovascular and Mediastinum   Essential hypertension - Primary    bp in fair control at  this time  BP Readings from Last 1 Encounters:  07/18/16 122/62   No changes needed Disc lifstyle change with low sodium diet and exercise  Continue losartan      Relevant Medications   furosemide (LASIX) 20 MG tablet   losartan (COZAAR) 100 MG tablet     Endocrine   DM type 2 (diabetes mellitus, type 2) ([facility])    Lab Results  Component Value Date   HGBA1C 7.5 (H)  07/14/2016   Improved but not at goal Inc metformin to 1000 mg bid as tolerated  Rev low glycemic diet  Lab in 3 mo for A1C       Relevant Medications   metFORMIN (GLUCOPHAGE) 1000 MG tablet   losartan (COZAAR) 100 MG tablet     Musculoskeletal and Integument   Osteopenia    Declines another dexa or tx  Continue vit D Falls but not fx  Fall prev disc in detail         Other   Closed head injury    Had fall yesterday Reviewed hospital records, lab results and studies in detail  Reassuring exam and Ct of head and neck  Will watch for HA or other symptoms       Fall in home    [place] again with ED visit Reviewed hospital records, lab results and studies in detail  Reassuring exam , CT of head and neck are w/o change  Long disc re: fall prev Walker recommended at all times  Also if needed rails on bed and bedside commode      Hyperlipidemia    Disc goals for lipids and reasons to control them Rev labs with pt Rev low sat fat diet in detail Well controlled with crestor and diet       Relevant Medications   furosemide (LASIX) 20 MG tablet   losartan (COZAAR) 100 MG tablet   Vitamin D deficiency    Vitamin D level is therapeutic with current supplementation Disc importance of this to bone and overall health

## 2016-07-19 NOTE — Assessment & Plan Note (Signed)
Vitamin D level is therapeutic with current supplementation Disc importance of this to bone and overall health  

## 2016-07-19 NOTE — Assessment & Plan Note (Signed)
Declines another dexa or tx  Continue vit D Falls but not fx  Fall prev disc in detail

## 2016-07-19 NOTE — Assessment & Plan Note (Signed)
Disc goals for lipids and reasons to control them Rev labs with pt Rev low sat fat diet in detail Well controlled with crestor and diet  

## 2016-07-19 NOTE — Assessment & Plan Note (Signed)
bp in fair control at this time  BP Readings from Last 1 Encounters:  07/18/16 122/62   No changes needed Disc lifstyle change with low sodium diet and exercise  Continue losartan

## 2016-07-19 NOTE — Assessment & Plan Note (Signed)
[place] again with ED visit Reviewed hospital records, lab results and studies in detail  Reassuring exam , CT of head and neck are w/o change  Long disc re: fall prev Walker recommended at all times  Also if needed rails on bed and bedside commode

## 2016-07-19 NOTE — Assessment & Plan Note (Signed)
Had fall yesterday Reviewed hospital records, lab results and studies in detail  Reassuring exam and Ct of head and neck  Will watch for HA or other symptoms

## 2016-07-19 NOTE — Assessment & Plan Note (Signed)
Lab Results  Component Value Date   HGBA1C 7.5 (H) 07/14/2016   Improved but not at goal Inc metformin to 1000 mg bid as tolerated  Rev low glycemic diet  Lab in 3 mo for A1C

## 2016-07-24 ENCOUNTER — Telehealth: Payer: Self-pay | Admitting: *Deleted

## 2016-07-24 NOTE — Telephone Encounter (Signed)
Aware of this already- she cannot take it/on her med intolerance list  Will continue what she is on

## 2016-07-24 NOTE — Telephone Encounter (Signed)
Received fax from [facility] saying:  "Nexium can decrease the effectiveness of clopidogrel, do you want to change Rx to pantoprazole?"  Please advise

## 2016-07-25 NOTE — Telephone Encounter (Signed)
Left voicemail at the pharmacy letting them know Dr. Marliss Coots comments

## 2016-08-01 ENCOUNTER — Ambulatory Visit: Payer: Medicare Other | Admitting: Family Medicine

## 2016-08-18 ENCOUNTER — Other Ambulatory Visit: Payer: Self-pay

## 2016-10-02 ENCOUNTER — Other Ambulatory Visit: Payer: Self-pay | Admitting: Family Medicine

## 2016-10-08 ENCOUNTER — Other Ambulatory Visit: Payer: Self-pay | Admitting: Family Medicine

## 2016-10-20 ENCOUNTER — Emergency Department: Payer: Medicare Other

## 2016-10-20 ENCOUNTER — Emergency Department
Admission: EM | Admit: 2016-10-20 | Discharge: 2016-10-20 | Disposition: A | Discharge status: Home / Self Care | Payer: Medicare Other | Attending: Emergency Medicine | Admitting: Emergency Medicine

## 2016-10-20 ENCOUNTER — Encounter: Payer: Self-pay | Admitting: Emergency Medicine

## 2016-10-20 DIAGNOSIS — W01198A Fall on same level from slipping, tripping and stumbling with subsequent striking against other object, initial encounter: Secondary | ICD-10-CM | POA: Diagnosis not present

## 2016-10-20 DIAGNOSIS — I10 Essential (primary) hypertension: Secondary | ICD-10-CM | POA: Diagnosis not present

## 2016-10-20 DIAGNOSIS — R51 Headache: Secondary | ICD-10-CM | POA: Diagnosis not present

## 2016-10-20 DIAGNOSIS — E119 Type 2 diabetes mellitus without complications: Secondary | ICD-10-CM | POA: Insufficient documentation

## 2016-10-20 DIAGNOSIS — Z7902 Long term (current) use of antithrombotics/antiplatelets: Secondary | ICD-10-CM | POA: Insufficient documentation

## 2016-10-20 DIAGNOSIS — S0990XA Unspecified injury of head, initial encounter: Secondary | ICD-10-CM | POA: Diagnosis not present

## 2016-10-20 DIAGNOSIS — Y998 Other external cause status: Secondary | ICD-10-CM | POA: Diagnosis not present

## 2016-10-20 DIAGNOSIS — W19XXXA Unspecified fall, initial encounter: Secondary | ICD-10-CM

## 2016-10-20 DIAGNOSIS — Y939 Activity, unspecified: Secondary | ICD-10-CM | POA: Insufficient documentation

## 2016-10-20 DIAGNOSIS — Z79899 Other long term (current) drug therapy: Secondary | ICD-10-CM | POA: Diagnosis not present

## 2016-10-20 DIAGNOSIS — J45909 Unspecified asthma, uncomplicated: Secondary | ICD-10-CM | POA: Insufficient documentation

## 2016-10-20 DIAGNOSIS — Z885 Allergy status to narcotic agent status: Secondary | ICD-10-CM | POA: Insufficient documentation

## 2016-10-20 DIAGNOSIS — Z7984 Long term (current) use of oral hypoglycemic drugs: Secondary | ICD-10-CM | POA: Diagnosis not present

## 2016-10-20 DIAGNOSIS — Y929 Unspecified place or not applicable: Secondary | ICD-10-CM | POA: Diagnosis not present

## 2016-10-20 DIAGNOSIS — Z91013 Allergy to seafood: Secondary | ICD-10-CM | POA: Diagnosis not present

## 2016-10-20 DIAGNOSIS — Z880 Allergy status to penicillin: Secondary | ICD-10-CM | POA: Diagnosis not present

## 2016-10-20 NOTE — ED Provider Notes (Signed)
[facility] Emergency Department Provider Note  ____________________________________________   First MD Initiated Contact with Patient 10/20/16 2026     (approximate)  I have reviewed the triage vital signs and the nursing notes.   HISTORY  Chief Complaint Fall   HPI Lydia King is a 83 y.o. female who self presents to the emergency department after a mechanical slip and fall striking the back of her head. She said "my feet got tangled up" and she fell backwards and struck her right occiput. She did not lose consciousness. She had a seizure. She became concerned because she does have history of stroke and takes Plavix.the patient's headache came on suddenly has been slowly improving was worse when she hit her head and improved when not hitting her head.   Past Medical History:  Diagnosis Date  . Anemia    in past (from blood loss at hiatal hernia)  . Asthma   . Charcot-Marie-Tooth disease    walks with cane (fairly controlled)  . Diverticulosis   . DVT (deep venous thrombosis) ([place]) 2003   after sx  . GERD (gastroesophageal reflux disease)   . Grief reaction    on fluoxetine  . Headache(784.0)    migraine  . Hiatal hernia   . Hyperlipidemia   . Hypertension   . OA (ocular albinism) ([place])   . Osteopenia   . PUD (peptic ulcer disease)    gastric, past  . Shingles   . Stroke ([place]) 8/10   posterior reversible encephalopathy syndrome  . Urine incontinence     Patient Active Problem List   Diagnosis Date Noted  . OSA (obstructive sleep apnea) 01/02/2016  . Pedal edema 01/02/2016  . Hyponatremia 01/02/2016  . Hyperkalemia 01/02/2016  . Fall in home 12/11/2015  . Poor balance 12/11/2015  . Closed head injury 12/11/2015  . Low back pain with sciatica 10/02/2015  . Right hip pain 10/02/2015  . Hearing loss 04/18/2015  . Encounter for Medicare annual wellness exam 10/04/2013  . DM type 2 (diabetes mellitus, type 2) ([place]) 08/31/2012  .  CVA 09/12/2008  . OSTEOARTHRITIS, MULTIPLE JOINTS 05/30/2008  . Osteopenia 05/30/2008  . Vitamin D deficiency 07/14/2007  . SLEEP APNEA 07/14/2007  . Hyperlipidemia 05/26/2007  . Essential hypertension 11/30/2006  . Asthma 11/30/2006  . H/O gastroesophageal reflux (GERD) 11/30/2006  . G I BLEED 11/30/2006  . DEEP VENOUS THROMBOPHLEBITIS, HX OF 11/30/2006    Past Surgical History:  Procedure Laterality Date  . carotid dopplers  8/10  . COLONOSCOPY  2002  . HERNIA REPAIR  2002   bleeding  . HIATAL HERNIA REPAIR  2000   needed blood transfusion  . Stroke  8/10   hosp stroke/ posterior (vs posterior reversible encephalopathy syndrome) - d/c on Plavix  . TEE normal  4/12   no emoblic source  . TOTAL KNEE ARTHROPLASTY  03,06   Bilateral  . VESICOVAGINAL FISTULA CLOSURE W/ TAH  1964   with bleeding and cervical dysplasia    Prior to Admission medications   Medication Sig Start Date End Date Taking? Authorizing Provider  acetaminophen (TYLENOL) 500 MG tablet Take 500 mg by mouth every 8 (eight) hours as needed.     [provider]  Cholecalciferol (VITAMIN D) 2000 UNITS CAPS Take 2 capsules by mouth daily.      [provider]  clopidogrel (PLAVIX) 75 MG tablet Take 1 tablet (75 mg total) by mouth daily. 07/18/16   Tower, Wynelle Fanny, MD  cyclobenzaprine (FLEXERIL) 10 MG tablet TAKE 1/2 TO 1 TABLET BY MOUTH 3 TIMES A DAY AS NEEDED FOR MUSCLE SPASMS(WATCH FOR SEDATION AND FALLS 07/18/16   Tower, Wynelle Fanny, MD  esomeprazole (NEXIUM) 40 MG capsule Take 1 capsule (40 mg total) by mouth daily before breakfast. 07/18/16   Tower, Wynelle Fanny, MD  Flaxseed, Linseed, (FLAX SEED OIL) 1000 MG CAPS Take 1 capsule by mouth daily.      [provider]  FLUoxetine (PROZAC) 20 MG capsule Take 1 capsule (20 mg total) by mouth daily. 07/18/16   Tower, Wynelle Fanny, MD  furosemide (LASIX) 20 MG tablet Take 2 tablets (40 mg total) by mouth daily. 07/18/16   Tower, Wynelle Fanny, MD  losartan (COZAAR)  100 MG tablet TAKE 1/2 TAB BY MOUTH ONCE A DAY 07/18/16   Tower, Wynelle Fanny, MD  metFORMIN (GLUCOPHAGE) 1000 MG tablet Take 1 tablet (1,000 mg total) by mouth 2 (two) times daily with a meal. 07/18/16   Tower, Wynelle Fanny, MD  Misc. Devices (WALKER) MISC Wheeled walker with seat- Pt must use for ambulation-- for use as directed: ambulation dx: 729.5, 780.79, 786.05, 784.0, 715.89.     [provider]  omega-3 acid ethyl esters (LOVAZA) 1 g capsule TAKE 1 CAPSULE (1 G TOTAL) BY MOUTH DAILY. 10/02/16   Tower, Wynelle Fanny, MD  PROAIR HFA 108 [phone] Base) MCG/ACT inhaler INHALE 2 PUFFS INTO THE LUNGS EVERY 4 (FOUR) HOURS AS NEEDED FOR WHEEZING OR SHORTNESS OF BREATH. 03/05/16   Tower, Wynelle Fanny, MD  rosuvastatin (CRESTOR) 10 MG tablet TAKE 1 TABLET BY MOUTH AT BEDTIME 10/08/16   Tower, Wynelle Fanny, MD    Allergies Shrimp [shellfish allergy]; Amlodipine besylate; Atorvastatin; Ciprofloxacin; Codeine; Influenza vaccines; Nitrofurantoin; Omeprazole; Pantoprazole sodium; Penicillins; Prednisone; Simvastatin; and Sulfonamide derivatives  Family History  Problem Relation Age of Onset  . Cerebral aneurysm Son   . Cerebral aneurysm Father   . Hyperlipidemia Father   . Alcohol abuse Father   . Stroke Mother   . Cervical cancer Sister   . Hyperlipidemia Brother   . Alcohol abuse Brother   . Stroke Brother   . Diabetes Brother   . Diabetes Sister   . Sarcoidosis Sister   . Other Sister        OP/ Broken hip  . Other Unknown        whole family has charcot marie tooth  . Ovarian cancer Sister     Social History Social History  Substance Use Topics  . Smoking status: Never Smoker  . Smokeless tobacco: Never Used  . Alcohol use No    Review of Systems Constitutional: No fever/chills Eyes: No visual changes. ENT: No sore throat. Cardiovascular: Denies chest pain. Respiratory: Denies shortness of breath. Gastrointestinal: No abdominal pain.  No nausea, no vomiting.  No diarrhea.  No  constipation. Genitourinary: Negative for dysuria. Musculoskeletal: Negative for back pain. Skin: Negative for rash. Neurological: positive for headache   ____________________________________________   PHYSICAL EXAM:  VITAL SIGNS: ED Triage Vitals [10/20/16 2021]  Enc Vitals Group     BP 124/62     Pulse Rate 86     Resp 15     Temp 99.2 F (37.3 C)     Temp Source Oral     SpO2 93 %     Weight 179 lb (81.2 kg)     Height 5' (1.524 m)     Head Circumference      Peak Flow  Pain Score      Pain Loc      Pain Edu?      Excl. in [place]?     Constitutional: alert and oriented 4 well appearing nontoxic no diaphoresis speaks in full clear sentences Eyes: PERRL EOMI. Head: Atraumatic.no lacerations no hematomas no tenderness Nose: No congestion/rhinnorhea. Mouth/Throat: No trismus Neck: No stridor.  no midline tenderness or step-offs Cardiovascular: Normal rate, regular rhythm. Grossly normal heart sounds.  Good peripheral circulation. Respiratory: Normal respiratory effort.  No retractions. Lungs CTAB and moving good air Gastrointestinal: soft nontender Musculoskeletal: No lower extremity edema   Neurologic:  Normal speech and language. No gross focal neurologic deficits are appreciated. Skin:  Skin is warm, dry and intact. No rash noted. Psychiatric: Mood and affect are normal. Speech and behavior are normal.    ____________________________________________   DIFFERENTIAL includes but not limited to  intracerebral hemorrhage, mechanical fall, cardiogenic syncope, cervical spine fracture ____________________________________________   LABS (all labs ordered are listed, but only abnormal results are displayed)  Labs Reviewed - No data to display   __________________________________________  EKG   ____________________________________________  RADIOLOGY  CT scan reviewed by me shows chronic encephalomalacia however no acute  disease ____________________________________________   PROCEDURES  Procedure(s) performed: no  Procedures  Critical Care performed: no  Observation: no ____________________________________________   INITIAL IMPRESSION / ASSESSMENT AND PLAN / ED COURSE  Pertinent labs & imaging results that were available during my care of the patient were reviewed by me and considered in my medical decision making (see chart for details).  The patient arrives very well-appearing after a low mechanism fall however she is on Plavix so I obtained a CT scan of her head which fortunately is negative. She has no neuro symptoms and no step-offs or tenderness midline. At this point the patient is medically stable for outpatient management and is discharged home with her daughters.      ____________________________________________   FINAL CLINICAL IMPRESSION(S) / ED DIAGNOSES  Final diagnoses:  Accident due to mechanical fall without injury, initial encounter      NEW MEDICATIONS STARTED DURING THIS VISIT:  New Prescriptions   No medications on file     Note:  This document was prepared using Dragon voice recognition software and may include unintentional dictation errors.     Darel Hong, MD 10/20/16 2100

## 2016-10-20 NOTE — ED Notes (Signed)
Patient transported to CT 

## 2016-10-20 NOTE — ED Notes (Signed)
Pt was in the bathroom tonight and sat down in the floor and bumped her head on a plastic bin for towels. No broken skin or bleeding. Pt has a cane and a hx of falls. Pt takes plavix.

## 2016-10-20 NOTE — Discharge Instructions (Signed)
Fortunately today your CT scan was very normal and you are not bleeding. Please follow-up with your primary care physician as scheduled and return to the emergency department for any concerns.  It was a pleasure to take care of you today, and thank you for coming to our emergency department.  If you have any questions or concerns before leaving please ask the nurse to grab me and I'm more than happy to go through your aftercare instructions again.  If you were prescribed any opioid pain medication today such as Norco, Vicodin, Percocet, morphine, hydrocodone, or oxycodone please make sure you do not drive when you are taking this medication as it can alter your ability to drive safely.  If you have any concerns once you are home that you are not improving or are in fact getting worse before you can make it to your follow-up appointment, please do not hesitate to call [phone] and come back for further evaluation.  Darel Hong, MD  Results for orders placed or performed in visit on 07/14/16  CBC with Differential/Platelet  Result Value Ref Range   WBC 6.1 4.0 - 10.5 K/uL   RBC 4.52 3.87 - 5.11 Mil/uL   Hemoglobin 13.1 12.0 - 15.0 g/dL   HCT 40.1 36.0 - 46.0 %   MCV 88.8 78.0 - 100.0 fl   MCHC 32.7 30.0 - 36.0 g/dL   RDW 13.9 11.5 - 15.5 %   Platelets 197.0 150.0 - 400.0 K/uL   Neutrophils Relative % 55.6 43.0 - 77.0 %   Lymphocytes Relative 36.5 12.0 - 46.0 %   Monocytes Relative 6.0 3.0 - 12.0 %   Eosinophils Relative 1.6 0.0 - 5.0 %   Basophils Relative 0.3 0.0 - 3.0 %   Neutro Abs 3.4 1.4 - 7.7 K/uL   Lymphs Abs 2.2 0.7 - 4.0 K/uL   Monocytes Absolute 0.4 0.1 - 1.0 K/uL   Eosinophils Absolute 0.1 0.0 - 0.7 K/uL   Basophils Absolute 0.0 0.0 - 0.1 K/uL  Comprehensive metabolic panel  Result Value Ref Range   Sodium 138 135 - 145 mEq/L   Potassium 4.2 3.5 - 5.1 mEq/L   Chloride 100 96 - 112 mEq/L   CO2 31 19 - 32 mEq/L   Glucose, Bld 109 (H) 70 - 99 mg/dL   BUN 22 6 - 23 mg/dL   Creatinine, Ser 0.78 0.40 - 1.20 mg/dL   Total Bilirubin 0.3 0.2 - 1.2 mg/dL   Alkaline Phosphatase 78 39 - 117 U/L   AST 12 0 - 37 U/L   ALT 10 0 - 35 U/L   Total Protein 6.6 6.0 - 8.3 g/dL   Albumin 3.9 3.5 - 5.2 g/dL   Calcium 9.7 8.4 - 10.5 mg/dL   GFR 74.80 >60.00 mL/min  Hemoglobin A1c  Result Value Ref Range   Hgb A1c MFr Bld 7.5 (H) 4.6 - 6.5 %  Lipid panel  Result Value Ref Range   Cholesterol 169 0 - 200 mg/dL   Triglycerides 222.0 (H) 0.0 - 149.0 mg/dL   HDL 53.20 >39.00 mg/dL   VLDL 44.4 (H) 0.0 - 40.0 mg/dL   Total CHOL/HDL Ratio 3    NonHDL 115.59   TSH  Result Value Ref Range   TSH 1.95 0.35 - 4.50 uIU/mL  VITAMIN D 25 Hydroxy (Vit-D Deficiency, Fractures)  Result Value Ref Range   VITD 61.18 30.00 - 100.00 ng/mL  LDL cholesterol, direct  Result Value Ref Range   Direct LDL 78.0 mg/dL  Ct Head Wo Contrast  Result Date: 10/20/2016 CLINICAL DATA:  Ataxia, fall head injury EXAM: CT HEAD WITHOUT CONTRAST TECHNIQUE: Contiguous axial images were obtained from the base of the skull through the vertex without intravenous contrast. COMPARISON:  07/16/2016, 12/07/2015 FINDINGS: Brain: No acute territorial infarction, hemorrhage or intracranial mass is seen. Old cerebellar and occipital infarcts are re- demonstrated. Mild small vessel ischemic changes of the white matter. Mild atrophy. Stable ventricle size. Vascular: No hyperdense vessels. Carotid artery calcifications. Vertebrobasilar calcifications. Skull: No fracture or suspicious bone lesion Sinuses/Orbits: No acute finding. Other: None IMPRESSION: 1. No CT evidence for acute intracranial abnormality. 2. Atrophy and old cerebellar and occipital infarcts. Mild small vessel ischemic changes of the white matter. Electronically Signed   By: Donavan Foil M.D.   On: 10/20/2016 20:49

## 2016-10-20 NOTE — ED Notes (Signed)
Family at bedside. 

## 2016-10-20 NOTE — ED Triage Notes (Signed)
Patient presents to ED via POV from home post fall. Patient states "my feet got me caught up". Patient denies LOC but states she hit her head. Patient on Plavix. A&O x4.

## 2016-10-20 NOTE — ED Notes (Signed)
ED Provider at bedside. 

## 2016-12-10 ENCOUNTER — Ambulatory Visit (INDEPENDENT_AMBULATORY_CARE_PROVIDER_SITE_OTHER): Payer: Medicare Other | Admitting: Primary Care

## 2016-12-10 ENCOUNTER — Encounter: Payer: Self-pay | Admitting: Primary Care

## 2016-12-10 ENCOUNTER — Ambulatory Visit: Payer: Self-pay

## 2016-12-10 VITALS — BP 122/70 | HR 86 | Temp 97.7°F | Ht <= 58 in | Wt 174.8 lb

## 2016-12-10 DIAGNOSIS — R42 Dizziness and giddiness: Secondary | ICD-10-CM | POA: Diagnosis not present

## 2016-12-10 MED ORDER — ONDANSETRON 4 MG PO TBDP
4.0000 mg | ORAL_TABLET | Freq: Once | ORAL | Status: AC
  Administered 2016-12-10: 4 mg via ORAL

## 2016-12-10 MED ORDER — MECLIZINE HCL 25 MG PO TABS: 12.0000 mg | ORAL_TABLET | Freq: Three times a day (TID) | ORAL | 0 refills | Status: DC | PRN

## 2016-12-10 MED ORDER — ONDANSETRON 4 MG PO TBDP: 4.0000 mg | ORAL_TABLET | Freq: Three times a day (TID) | ORAL | 0 refills | Status: DC | PRN

## 2016-12-10 NOTE — Progress Notes (Signed)
Subjective:    Patient ID: Lydia King, female    DOB: 01/02/1931, 83 y.o.   MRN: [id number]  HPI  Lydia King is an 83 year old female with a history of essential hypertension, CVA, diabetes type 2, GI bleed, electrolyte imbalance who presents today with a chief complaint of dizziness. She also reports nausea with one minor episode of vomiting this morning on the way over to the office.  Her dizziness began 2 nights ago. She was laying in bed and felt a mild dizzy feeling. Last night she was in bed and turned over onto her side and the whole room began spinning. This lasted for 15-20 minutes and was moderate to severe. She was okay overall throughout the day yesterday, before the episode later that night. She woke up this morning and still feels quite dizzy, especially with position changes. She's been using essential oils with some improvement in dizziness.   She denies cough, fevers, abdominal pain, bloody stool, foul smelling urine, dysuria, chest pain, low blood sugars. She endorses drinking a lot of water. She's checking her blood pressure at home and is getting readings of 120's/80's. She denies dizziness normally. She does have a history of vertigo that occurred 20 years ago, this feels similar. She's not taken anything OTC for her symptoms.  Review of Systems  Constitutional: Negative for fever.  Respiratory: Negative for shortness of breath.   Cardiovascular: Negative for chest pain.  Gastrointestinal: Positive for nausea. Negative for abdominal pain and blood in stool.  Genitourinary: Negative for dysuria and frequency.  Neurological: Positive for dizziness. Negative for speech difficulty, weakness and headaches.       Past Medical History:  Diagnosis Date  . Anemia    in past (from blood loss at hiatal hernia)  . Asthma   . Charcot-Marie-Tooth disease    walks with cane (fairly controlled)  . Diverticulosis   . DVT (deep venous thrombosis) ([place]) 2003   after sx  .  GERD (gastroesophageal reflux disease)   . Grief reaction    on fluoxetine  . Headache(784.0)    migraine  . Hiatal hernia   . Hyperlipidemia   . Hypertension   . OA (ocular albinism) ([place])   . Osteopenia   . PUD (peptic ulcer disease)    gastric, past  . Shingles   . Stroke ([place]) 8/10   posterior reversible encephalopathy syndrome  . Urine incontinence      Social History   Socioeconomic History  . Marital status: Widowed    Spouse name: Not on file  . Number of children: Not on file  . Years of education: Not on file  . Highest education level: Not on file  Social Needs  . Financial resource strain: Not on file  . Food insecurity - worry: Not on file  . Food insecurity - inability: Not on file  . Transportation needs - medical: Not on file  . Transportation needs - non-medical: Not on file  Occupational History  . Not on file  Tobacco Use  . Smoking status: Never Smoker  . Smokeless tobacco: Never Used  Substance and Sexual Activity  . Alcohol use: No  . Drug use: No  . Sexual activity: No  Other Topics Concern  . Not on file  Social History Narrative   G6P4   Son died 2 years ago cerebral anerysm    Past Surgical History:  Procedure Laterality Date  . carotid dopplers  8/10  . COLONOSCOPY  2002  .  HERNIA REPAIR  2002   bleeding  . HIATAL HERNIA REPAIR  2000   needed blood transfusion  . Stroke  8/10   hosp stroke/ posterior (vs posterior reversible encephalopathy syndrome) - d/c on Plavix  . TEE normal  0/24   no emoblic source  . TOTAL KNEE ARTHROPLASTY  03,06   Bilateral  . VESICOVAGINAL FISTULA CLOSURE W/ TAH  1964   with bleeding and cervical dysplasia    Family History  Problem Relation Age of Onset  . Cerebral aneurysm Son   . Cerebral aneurysm Father   . Hyperlipidemia Father   . Alcohol abuse Father   . Stroke Mother   . Cervical cancer Sister   . Hyperlipidemia Brother   . Alcohol abuse Brother   . Stroke Brother   . Diabetes  Brother   . Diabetes Sister   . Sarcoidosis Sister   . Other Sister        OP/ Broken hip  . Other Unknown        whole family has charcot marie tooth  . Ovarian cancer Sister     Allergies  Allergen Reactions  . Shrimp [Shellfish Allergy] Anaphylaxis    Anaphylaxis-swelling of throat/sob    . Amlodipine Besylate     REACTION: edema  . Atorvastatin     REACTION: pain- could not walk  . Ciprofloxacin     REACTION: unkown  . Codeine     REACTION: itchy rash  . Influenza Vaccines Other (See Comments)    Arm swelling, redness, chills, fever  . Nitrofurantoin   . Omeprazole     REACTION: not effective  . Pantoprazole Sodium     REACTION: does not work well  . Penicillins     REACTION: itchy rash  . Prednisone     REACTION: RASH.// SHORTNESS OF BREATH  . Simvastatin     REACTION: leg pain  . Sulfonamide Derivatives     REACTION: itchy rash    Current Outpatient Medications on File Prior to Visit  Medication Sig Dispense Refill  . acetaminophen (TYLENOL) 500 MG tablet Take 500 mg by mouth every 8 (eight) hours as needed.     . Cholecalciferol (VITAMIN D) 2000 UNITS CAPS Take 2 capsules by mouth daily.      . clopidogrel (PLAVIX) 75 MG tablet Take 1 tablet (75 mg total) by mouth daily. 90 tablet 3  . cyclobenzaprine (FLEXERIL) 10 MG tablet TAKE 1/2 TO 1 TABLET BY MOUTH 3 TIMES A DAY AS NEEDED FOR MUSCLE SPASMS(WATCH FOR SEDATION AND FALLS 270 tablet 1  . esomeprazole (NEXIUM) 40 MG capsule Take 1 capsule (40 mg total) by mouth daily before breakfast. 90 capsule 3  . Flaxseed, Linseed, (FLAX SEED OIL) 1000 MG CAPS Take 1 capsule by mouth daily.      Marland Kitchen FLUoxetine (PROZAC) 20 MG capsule Take 1 capsule (20 mg total) by mouth daily. 90 capsule 3  . furosemide (LASIX) 20 MG tablet Take 2 tablets (40 mg total) by mouth daily. 180 tablet 3  . losartan (COZAAR) 100 MG tablet TAKE 1/2 TAB BY MOUTH ONCE A DAY 45 tablet 3  . metFORMIN (GLUCOPHAGE) 1000 MG tablet Take 1 tablet (1,000 mg  total) by mouth 2 (two) times daily with a meal. 180 tablet 3  . Misc. Devices (WALKER) MISC Wheeled walker with seat- Pt must use for ambulation-- for use as directed: ambulation dx: 729.5, 780.79, 786.05, 784.0, 715.89.     Marland Kitchen omega-3 acid ethyl esters (LOVAZA) 1  g capsule TAKE 1 CAPSULE (1 G TOTAL) BY MOUTH DAILY. 90 capsule 2  . PROAIR HFA 108 (90 Base) MCG/ACT inhaler INHALE 2 PUFFS INTO THE LUNGS EVERY 4 (FOUR) HOURS AS NEEDED FOR WHEEZING OR SHORTNESS OF BREATH. 8.5 g 3  . rosuvastatin (CRESTOR) 10 MG tablet TAKE 1 TABLET BY MOUTH AT BEDTIME 90 tablet 1   No current facility-administered medications on file prior to visit.     BP 122/70   Pulse 86   Temp 97.7 F (36.5 C) (Oral)   Ht 4\' 9"  (1.448 m)   Wt 174 lb 12.8 oz (79.3 kg)   SpO2 95%   BMI 37.83 kg/m    Objective:   Physical Exam  Constitutional: She is oriented to person, place, and time. She appears well-nourished.  HENT:  Right Ear: Ear canal normal. A middle ear effusion is present.  Left Ear: Tympanic membrane and ear canal normal.  Nose: No mucosal edema. Right sinus exhibits maxillary sinus tenderness. Left sinus exhibits maxillary sinus tenderness.  Mouth/Throat: Oropharynx is clear and moist.  Eyes: EOM are normal.  Felt dizzy during EOM testing.  Cardiovascular: Normal rate and regular rhythm.  Pulmonary/Chest: Effort normal and breath sounds normal.  Neurological: She is alert and oriented to person, place, and time. No cranial nerve deficit. Coordination normal.  Skin: Skin is warm and dry.          Assessment & Plan:  Vertigo:  Intermittently for the past 2 days, worse last night and this morning. Exam today consistent for vertigo given HPI and exam. No evidence of recurrent CVA, hypoglycemia. Unlikely to be orthostatic hypotension given description of symptoms and normotensive readings at home. Did not attempt orthostatics given degree of dizziness in the office today. No history of CAD to suggest  cardiac involvement at this point. She doesn't appear acutely dehydrated.  Rx for meclizine sent to pharmacy with instructions for use and drowsiness precautions provided.  Rx for ODT Zofran provided to use PRN. Information provided for Epley's Maneuvers to use once less nauseated. Strict return precautions provided.  Sheral Flow, NP

## 2016-12-10 NOTE — Patient Instructions (Signed)
Your symptoms represent vertigo.  You may take the meclizine tablets every 8 hours as needed. Start by taking 1/2 tablet first as this may cause drowsiness.  You may take the ondansetron every 8 hours as needed for nausea and vomiting.   Try the maneuvers once you're feeling better.  Please call and update me Friday.  It was a pleasure meeting you!  How to Perform the Epley Maneuver The Epley maneuver is an exercise that relieves symptoms of vertigo. Vertigo is the feeling that you or your surroundings are moving when they are not. When you feel vertigo, you may feel like the room is spinning and have trouble walking. Dizziness is a little different than vertigo. When you are dizzy, you may feel unsteady or light-headed. You can do this maneuver at home whenever you have symptoms of vertigo. You can do it up to 3 times a day until your symptoms go away. Even though the Epley maneuver may relieve your vertigo for a few weeks, it is possible that your symptoms will return. This maneuver relieves vertigo, but it does not relieve dizziness. What are the risks? If it is done correctly, the Epley maneuver is considered safe. Sometimes it can lead to dizziness or nausea that goes away after a short time. If you develop other symptoms, such as changes in vision, weakness, or numbness, stop doing the maneuver and call your health care provider. How to perform the Epley maneuver 1. Sit on the edge of a bed or table with your back straight and your legs extended or hanging over the edge of the bed or table. 2. Turn your head halfway toward the affected ear or side. 3. Lie backward quickly with your head turned until you are lying flat on your back. You may want to position a pillow under your shoulders. 4. Hold this position for 30 seconds. You may experience an attack of vertigo. This is normal. 5. Turn your head to the opposite direction until your unaffected ear is facing the floor. 6. Hold this  position for 30 seconds. You may experience an attack of vertigo. This is normal. Hold this position until the vertigo stops. 7. Turn your whole body to the same side as your head. Hold for another 30 seconds. 8. Sit back up. You can repeat this exercise up to 3 times a day. Follow these instructions at home:  After doing the Epley maneuver, you can return to your normal activities.  Ask your health care provider if there is anything you should do at home to prevent vertigo. He or she may recommend that you: ? Keep your head raised (elevated) with two or more pillows while you sleep. ? Do not sleep on the side of your affected ear. ? Get up slowly from bed. ? Avoid sudden movements during the day. ? Avoid extreme head movement, like looking up or bending over. Contact a health care provider if:  Your vertigo gets worse.  You have other symptoms, including: ? Nausea. ? Vomiting. ? Headache. Get help right away if:  You have vision changes.  You have a severe or worsening headache or neck pain.  You cannot stop vomiting.  You have new numbness or weakness in any part of your body. Summary  Vertigo is the feeling that you or your surroundings are moving when they are not.  The Epley maneuver is an exercise that relieves symptoms of vertigo.  If the Epley maneuver is done correctly, it is considered safe. You  can do it up to 3 times a day. This information is not intended to replace advice given to you by your health care provider. Make sure you discuss any questions you have with your health care provider. Document Released: 01/18/2013 Document Revised: 12/04/2015 Document Reviewed: 12/04/2015 Elsevier Interactive Patient Education  2017 [employer].

## 2016-12-10 NOTE — Telephone Encounter (Signed)
  Reason for Disposition . [1] MODERATE dizziness (e.g., vertigo; feels very unsteady, interferes with normal activities) AND [2] has NOT been evaluated by physician for this  Answer Assessment - Initial Assessment Questions 1. DESCRIPTION: "Describe your dizziness."     Becomes dizzy with movement 2. VERTIGO: "Do you feel like either you or the room is spinning or tilting?"      Yes 3. LIGHTHEADED: "Do you feel lightheaded?" (e.g., somewhat faint, woozy, weak upon standing)     Nausea 4. SEVERITY: "How bad is it?"  "Can you walk?"   - MILD - Feels unsteady but walking normally.   - MODERATE - Feels very unsteady when walking, but not falling; interferes with normal activities (e.g., school, work) .   - SEVERE - Unable to walk without falling (requires assistance).     Moderate - feels "very unsteadt". 5. ONSET:  "When did the dizziness begin?"     Started yesterday morning 6. AGGRAVATING FACTORS: "Does anything make it worse?" (e.g., standing, change in head position)     Change in position 7. CAUSE: "What do you think is causing the dizziness?"     Vertigo 8. RECURRENT SYMPTOM: "Have you had dizziness before?" If so, ask: "When was the last time?" "What happened that time?"     "Long time ago - 20 years ago." 9. OTHER SYMPTOMS: "Do you have any other symptoms?" (e.g., headache, weakness, numbness, vomiting, earache)     Nausea 10. PREGNANCY: "Is there any chance you are pregnant?" "When was your last menstrual period?"       No  Protocols used: DIZZINESS - VERTIGO-A-AH Pt. States she "vertigo 20 years ago and was given some medicine."

## 2016-12-24 ENCOUNTER — Other Ambulatory Visit: Payer: Self-pay | Admitting: *Deleted

## 2016-12-24 MED ORDER — GLUCOSE BLOOD VI STRP: ORAL_STRIP | 1 refills | Status: DC

## 2016-12-28 ENCOUNTER — Emergency Department: Payer: Medicare Other

## 2016-12-28 ENCOUNTER — Emergency Department
Admission: EM | Admit: 2016-12-28 | Discharge: 2016-12-28 | Disposition: A | Discharge status: Home / Self Care | Payer: Medicare Other | Attending: Emergency Medicine | Admitting: Emergency Medicine

## 2016-12-28 DIAGNOSIS — Y9389 Activity, other specified: Secondary | ICD-10-CM | POA: Insufficient documentation

## 2016-12-28 DIAGNOSIS — R51 Headache: Secondary | ICD-10-CM | POA: Insufficient documentation

## 2016-12-28 DIAGNOSIS — W01198A Fall on same level from slipping, tripping and stumbling with subsequent striking against other object, initial encounter: Secondary | ICD-10-CM | POA: Diagnosis not present

## 2016-12-28 DIAGNOSIS — Z96653 Presence of artificial knee joint, bilateral: Secondary | ICD-10-CM | POA: Insufficient documentation

## 2016-12-28 DIAGNOSIS — J45909 Unspecified asthma, uncomplicated: Secondary | ICD-10-CM | POA: Diagnosis not present

## 2016-12-28 DIAGNOSIS — Z7984 Long term (current) use of oral hypoglycemic drugs: Secondary | ICD-10-CM | POA: Insufficient documentation

## 2016-12-28 DIAGNOSIS — S60221A Contusion of right hand, initial encounter: Secondary | ICD-10-CM | POA: Diagnosis not present

## 2016-12-28 DIAGNOSIS — M533 Sacrococcygeal disorders, not elsewhere classified: Secondary | ICD-10-CM | POA: Diagnosis not present

## 2016-12-28 DIAGNOSIS — S0990XA Unspecified injury of head, initial encounter: Secondary | ICD-10-CM | POA: Diagnosis not present

## 2016-12-28 DIAGNOSIS — Y999 Unspecified external cause status: Secondary | ICD-10-CM | POA: Insufficient documentation

## 2016-12-28 DIAGNOSIS — E119 Type 2 diabetes mellitus without complications: Secondary | ICD-10-CM | POA: Insufficient documentation

## 2016-12-28 DIAGNOSIS — S6991XA Unspecified injury of right wrist, hand and finger(s), initial encounter: Secondary | ICD-10-CM | POA: Diagnosis present

## 2016-12-28 DIAGNOSIS — Z79899 Other long term (current) drug therapy: Secondary | ICD-10-CM | POA: Diagnosis not present

## 2016-12-28 DIAGNOSIS — Z7902 Long term (current) use of antithrombotics/antiplatelets: Secondary | ICD-10-CM | POA: Insufficient documentation

## 2016-12-28 DIAGNOSIS — S3993XA Unspecified injury of pelvis, initial encounter: Secondary | ICD-10-CM | POA: Diagnosis not present

## 2016-12-28 DIAGNOSIS — Y92000 Kitchen of unspecified non-institutional (private) residence as  the place of occurrence of the external cause: Secondary | ICD-10-CM | POA: Insufficient documentation

## 2016-12-28 DIAGNOSIS — W19XXXA Unspecified fall, initial encounter: Secondary | ICD-10-CM

## 2016-12-28 DIAGNOSIS — I10 Essential (primary) hypertension: Secondary | ICD-10-CM | POA: Diagnosis not present

## 2016-12-28 DIAGNOSIS — M79641 Pain in right hand: Secondary | ICD-10-CM | POA: Diagnosis not present

## 2016-12-28 MED ORDER — PREDNISONE 5 MG PO TABS: 5.0000 mg | ORAL_TABLET | Freq: Every day | ORAL | 0 refills | Status: AC

## 2016-12-28 NOTE — Discharge Instructions (Signed)
Fortunately today your x-rays and your CT scan were reassuring.  Follow-up with Dr. Rogers Blocker in 2 days for recheck and return to the emergency department sooner for any concerns whatsoever.  It was a pleasure to take care of you today, and thank you for coming to our emergency department.  If you have any questions or concerns before leaving please ask the nurse to grab me and I'm more than happy to go through your aftercare instructions again.  If you were prescribed any opioid pain medication today such as Norco, Vicodin, Percocet, morphine, hydrocodone, or oxycodone please make sure you do not drive when you are taking this medication as it can alter your ability to drive safely.  If you have any concerns once you are home that you are not improving or are in fact getting worse before you can make it to your follow-up appointment, please do not hesitate to call 911 and come back for further evaluation.  Darel Hong, MD  Results for orders placed or performed in visit on 07/14/16  CBC with Differential/Platelet  Result Value Ref Range   WBC 6.1 4.0 - 10.5 K/uL   RBC 4.52 3.87 - 5.11 Mil/uL   Hemoglobin 13.1 12.0 - 15.0 g/dL   HCT 40.1 36.0 - 46.0 %   MCV 88.8 78.0 - 100.0 fl   MCHC 32.7 30.0 - 36.0 g/dL   RDW 13.9 11.5 - 15.5 %   Platelets 197.0 150.0 - 400.0 K/uL   Neutrophils Relative % 55.6 43.0 - 77.0 %   Lymphocytes Relative 36.5 12.0 - 46.0 %   Monocytes Relative 6.0 3.0 - 12.0 %   Eosinophils Relative 1.6 0.0 - 5.0 %   Basophils Relative 0.3 0.0 - 3.0 %   Neutro Abs 3.4 1.4 - 7.7 K/uL   Lymphs Abs 2.2 0.7 - 4.0 K/uL   Monocytes Absolute 0.4 0.1 - 1.0 K/uL   Eosinophils Absolute 0.1 0.0 - 0.7 K/uL   Basophils Absolute 0.0 0.0 - 0.1 K/uL  Comprehensive metabolic panel  Result Value Ref Range   Sodium 138 135 - 145 mEq/L   Potassium 4.2 3.5 - 5.1 mEq/L   Chloride 100 96 - 112 mEq/L   CO2 31 19 - 32 mEq/L   Glucose, Bld 109 (H) 70 - 99 mg/dL   BUN 22 6 - 23 mg/dL   Creatinine, Ser 0.78 0.40 - 1.20 mg/dL   Total Bilirubin 0.3 0.2 - 1.2 mg/dL   Alkaline Phosphatase 78 39 - 117 U/L   AST 12 0 - 37 U/L   ALT 10 0 - 35 U/L   Total Protein 6.6 6.0 - 8.3 g/dL   Albumin 3.9 3.5 - 5.2 g/dL   Calcium 9.7 8.4 - 10.5 mg/dL   GFR 74.80 >60.00 mL/min  Hemoglobin A1c  Result Value Ref Range   Hgb A1c MFr Bld 7.5 (H) 4.6 - 6.5 %  Lipid panel  Result Value Ref Range   Cholesterol 169 0 - 200 mg/dL   Triglycerides 222.0 (H) 0.0 - 149.0 mg/dL   HDL 53.20 >39.00 mg/dL   VLDL 44.4 (H) 0.0 - 40.0 mg/dL   Total CHOL/HDL Ratio 3    NonHDL 115.59   TSH  Result Value Ref Range   TSH 1.95 0.35 - 4.50 uIU/mL  VITAMIN D 25 Hydroxy (Vit-D Deficiency, Fractures)  Result Value Ref Range   VITD 61.18 30.00 - 100.00 ng/mL  LDL cholesterol, direct  Result Value Ref Range   Direct LDL 78.0 mg/dL  Dg Pelvis 1-2 Views  Result Date: 12/28/2016 CLINICAL DATA:  Fall.  Sacral pain. EXAM: PELVIS - 1-2 VIEW COMPARISON:  Lumbar spine study from 10/02/2015. FINDINGS: Bones are demineralized. Stable sclerotic lesion left sacrum. Degenerative changes again noted in the hips, right greater than left. SI joints and symphysis pubis unremarkable. No evidence for an acute fracture. IMPRESSION: Negative. Electronically Signed   By: Misty Stanley M.D.   On: 12/28/2016 14:37   Ct Head Wo Contrast  Result Date: 12/28/2016 CLINICAL DATA:  Headache, fall, hit head. EXAM: CT HEAD WITHOUT CONTRAST TECHNIQUE: Contiguous axial images were obtained from the base of the skull through the vertex without intravenous contrast. COMPARISON:  10/20/2016 FINDINGS: Brain: Old bilateral cerebellar and occipital infarcts, stable. No acute infarcts. No hemorrhage or hydrocephalus. Old right alignment lacunar infarct. Vascular: No hyperdense vessel or unexpected calcification. Skull: No acute calvarial abnormality. Sinuses/Orbits: Visualized paranasal sinuses and mastoids clear. Orbital soft tissues unremarkable.  Other: None IMPRESSION: Old bilateral cerebellar and occipital infarcts. Old right thalamus lacunar infarct. No acute intracranial abnormality. Electronically Signed   By: Rolm Baptise M.D.   On: 12/28/2016 14:19   Dg Hand Complete Right  Result Date: 12/28/2016 CLINICAL DATA:  Fall, right hand pain. EXAM: RIGHT HAND - COMPLETE 3+ VIEW COMPARISON:  12/27/2008 FINDINGS: Advanced arthritic changes in deformity throughout the right wrist, likely related to inflammatory arthropathy such as rheumatoid arthritis. No definite acute fracture. Diffuse joint space narrowing throughout the IP joints and MCP joints. No soft tissue abnormality. IMPRESSION: Advanced arthritic changes in the right wrist. No acute bony abnormality. Electronically Signed   By: Rolm Baptise M.D.   On: 12/28/2016 14:37

## 2016-12-28 NOTE — ED Provider Notes (Signed)
[facility] Emergency Department Provider Note  ____________________________________________   First MD Initiated Contact with Patient 12/28/16 1420     (approximate)  I have reviewed the triage vital signs and the nursing notes.   HISTORY  Chief Complaint Fall    HPI Lydia King is a 83 y.o. female who self presents to the emergency department with headache, right hand pain, and tailbone pain that began after a fall at home.  She has a past medical history of Charcot-Marie-Tooth disease and has frequent falls.  She said that sometimes her foot does not lift weights posterior and she collapses to the ground.  She struck her head against the refrigerator and then hit her knuckle of her pinky finger against the ground.  She did not lose consciousness.  Her pain currently is mild nonradiating.  It began suddenly.  Nothing seems to make it better or worse.  She is able to ambulate.  She denies antecedent chest pain or shortness of breath.  She denies palpitations.  Past Medical History:  Diagnosis Date  . Anemia    in past (from blood loss at hiatal hernia)  . Asthma   . Charcot-Marie-Tooth disease    walks with cane (fairly controlled)  . Diverticulosis   . DVT (deep venous thrombosis) ([place]) 2003   after sx  . GERD (gastroesophageal reflux disease)   . Grief reaction    on fluoxetine  . Headache(784.0)    migraine  . Hiatal hernia   . Hyperlipidemia   . Hypertension   . OA (ocular albinism) ([place])   . Osteopenia   . PUD (peptic ulcer disease)    gastric, past  . Shingles   . Stroke ([place]) 8/10   posterior reversible encephalopathy syndrome  . Urine incontinence     Patient Active Problem List   Diagnosis Date Noted  . OSA (obstructive sleep apnea) 01/02/2016  . Pedal edema 01/02/2016  . Hyponatremia 01/02/2016  . Hyperkalemia 01/02/2016  . Fall in home 12/11/2015  . Poor balance 12/11/2015  . Closed head injury 12/11/2015  . Low back  pain with sciatica 10/02/2015  . Right hip pain 10/02/2015  . Hearing loss 04/18/2015  . Encounter for Medicare annual wellness exam 10/04/2013  . DM type 2 (diabetes mellitus, type 2) ([place]) 08/31/2012  . CVA 09/12/2008  . OSTEOARTHRITIS, MULTIPLE JOINTS 05/30/2008  . Osteopenia 05/30/2008  . Vitamin D deficiency 07/14/2007  . SLEEP APNEA 07/14/2007  . Hyperlipidemia 05/26/2007  . Essential hypertension 11/30/2006  . Asthma 11/30/2006  . H/O gastroesophageal reflux (GERD) 11/30/2006  . G I BLEED 11/30/2006  . DEEP VENOUS THROMBOPHLEBITIS, HX OF 11/30/2006    Past Surgical History:  Procedure Laterality Date  . carotid dopplers  8/10  . COLONOSCOPY  2002  . HERNIA REPAIR  2002   bleeding  . HIATAL HERNIA REPAIR  2000   needed blood transfusion  . Stroke  8/10   hosp stroke/ posterior (vs posterior reversible encephalopathy syndrome) - d/c on Plavix  . TEE normal  9/16   no emoblic source  . TOTAL KNEE ARTHROPLASTY  03,06   Bilateral  . VESICOVAGINAL FISTULA CLOSURE W/ TAH  1964   with bleeding and cervical dysplasia    Prior to Admission medications   Medication Sig Start Date End Date Taking? Authorizing Provider  acetaminophen (TYLENOL) 500 MG tablet Take 500 mg by mouth every 8 (eight) hours as needed.     [provider]  Cholecalciferol (VITAMIN D)  2000 UNITS CAPS Take 2 capsules by mouth daily.      [provider]  clopidogrel (PLAVIX) 75 MG tablet Take 1 tablet (75 mg total) by mouth daily. 07/18/16   Tower, Wynelle Fanny, MD  cyclobenzaprine (FLEXERIL) 10 MG tablet TAKE 1/2 TO 1 TABLET BY MOUTH 3 TIMES A DAY AS NEEDED FOR MUSCLE SPASMS(WATCH FOR SEDATION AND FALLS 07/18/16   Tower, Wynelle Fanny, MD  esomeprazole (NEXIUM) 40 MG capsule Take 1 capsule (40 mg total) by mouth daily before breakfast. 07/18/16   Tower, Wynelle Fanny, MD  Flaxseed, Linseed, (FLAX SEED OIL) 1000 MG CAPS Take 1 capsule by mouth daily.      [provider]  FLUoxetine (PROZAC) 20 MG  capsule Take 1 capsule (20 mg total) by mouth daily. 07/18/16   Tower, Wynelle Fanny, MD  furosemide (LASIX) 20 MG tablet Take 2 tablets (40 mg total) by mouth daily. 07/18/16   Tower, Wynelle Fanny, MD  glucose blood (FREESTYLE LITE) test strip Use to check blood sugar once daily and as needed for DM (dx. E11.618) 12/24/16   Tower, Wynelle Fanny, MD  losartan (COZAAR) 100 MG tablet TAKE 1/2 TAB BY MOUTH ONCE A DAY 07/18/16   Tower, Wynelle Fanny, MD  meclizine (ANTIVERT) 25 MG tablet Take 0.5-1 tablets (12.5-25 mg total) 3 (three) times daily as needed by mouth for dizziness. 12/10/16   Pleas Koch, NP  metFORMIN (GLUCOPHAGE) 1000 MG tablet Take 1 tablet (1,000 mg total) by mouth 2 (two) times daily with a meal. 07/18/16   Tower, Wynelle Fanny, MD  Misc. Devices (WALKER) MISC Wheeled walker with seat- Pt must use for ambulation-- for use as directed: ambulation dx: 729.5, 780.79, 786.05, 784.0, 715.89.     [provider]  omega-3 acid ethyl esters (LOVAZA) 1 g capsule TAKE 1 CAPSULE (1 G TOTAL) BY MOUTH DAILY. 10/02/16   Tower, Wynelle Fanny, MD  ondansetron (ZOFRAN ODT) 4 MG disintegrating tablet Take 1 tablet (4 mg total) every 8 (eight) hours as needed by mouth for nausea or vomiting. 12/10/16   Pleas Koch, NP  predniSONE (DELTASONE) 5 MG tablet Take 1 tablet (5 mg total) by mouth daily for 7 days. 12/28/16 01/04/17  Darel Hong, MD  PROAIR HFA 108 [phone] Base) MCG/ACT inhaler INHALE 2 PUFFS INTO THE LUNGS EVERY 4 (FOUR) HOURS AS NEEDED FOR WHEEZING OR SHORTNESS OF BREATH. 03/05/16   Tower, Wynelle Fanny, MD  rosuvastatin (CRESTOR) 10 MG tablet TAKE 1 TABLET BY MOUTH AT BEDTIME 10/08/16   Tower, Wynelle Fanny, MD    Allergies Shrimp [shellfish allergy]; Amlodipine besylate; Atorvastatin; Ciprofloxacin; Codeine; Influenza vaccines; Nitrofurantoin; Omeprazole; Pantoprazole sodium; Penicillins; Prednisone; Simvastatin; and Sulfonamide derivatives  Family History  Problem Relation Age of Onset  . Cerebral aneurysm Son   .  Cerebral aneurysm Father   . Hyperlipidemia Father   . Alcohol abuse Father   . Stroke Mother   . Cervical cancer Sister   . Hyperlipidemia Brother   . Alcohol abuse Brother   . Stroke Brother   . Diabetes Brother   . Diabetes Sister   . Sarcoidosis Sister   . Other Sister        OP/ Broken hip  . Other Unknown        whole family has charcot marie tooth  . Ovarian cancer Sister     Social History Social History   Tobacco Use  . Smoking status: Never Smoker  . Smokeless tobacco: Never Used  Substance Use Topics  .  Alcohol use: No  . Drug use: No    Review of Systems Constitutional: No fever/chills Eyes: No visual changes. ENT: No sore throat. Cardiovascular: Denies chest pain. Respiratory: Denies shortness of breath. Gastrointestinal: No abdominal pain.  No nausea, no vomiting.  No diarrhea.  No constipation. Genitourinary: Negative for dysuria. Musculoskeletal: Negative for back pain. Skin: Negative for rash. Neurological: Positive for headache   ____________________________________________   PHYSICAL EXAM:  VITAL SIGNS: ED Triage Vitals  Enc Vitals Group     BP 12/28/16 1306 135/65     Pulse Rate 12/28/16 1306 93     Resp 12/28/16 1306 14     Temp 12/28/16 1306 98.5 F (36.9 C)     Temp Source 12/28/16 1306 Oral     SpO2 12/28/16 1306 94 %     Weight 12/28/16 1310 178 lb (80.7 kg)     Height 12/28/16 1310 5' (1.524 m)     Head Circumference --      Peak Flow --      Pain Score 12/28/16 1309 7     Pain Loc --      Pain Edu? --      Excl. in [place]? --     Constitutional: Alert and oriented x4 joking laughing very well-appearing nontoxic no diaphoresis speaks in full clear sentences Eyes: PERRL EOMI. Head: Atraumatic. Nose: No congestion/rhinnorhea. Mouth/Throat: No trismus Neck: No stridor.   Midline tenderness or step-offs Cardiovascular: Normal rate, regular rhythm. Grossly normal heart sounds.  Good peripheral circulation. Respiratory: Normal  respiratory effort.  No retractions. Lungs CTAB and moving good air Gastrointestinal: Soft nontender Musculoskeletal: Focally tender with ecchymosis over right hand knuckle on the dorsal aspect of the pinky finger.  Significant arthritic changes.  Otherwise neurovascularly intact Neurologic:  Normal speech and language. No gross focal neurologic deficits are appreciated. Skin:  Skin is warm, dry and intact. No rash noted. Psychiatric: Mood and affect are normal. Speech and behavior are normal.    ____________________________________________   DIFFERENTIAL includes but not limited to  Wrist fracture, hand fracture, intracerebral hemorrhage, cervical spine fracture, pelvic fracture ____________________________________________   LABS (all labs ordered are listed, but only abnormal results are displayed)  Labs Reviewed - No data to display   __________________________________________  EKG   ____________________________________________  RADIOLOGY  X-ray of the hand and pelvis reviewed by me with no acute disease CT scan of the head reviewed by me with no acute disease ____________________________________________   PROCEDURES  Procedure(s) performed: yes  SPLINT APPLICATION Date/Time: 2:68 PM Authorized by: Darel Hong Consent: Verbal consent obtained. Risks and benefits: risks, benefits and alternatives were discussed Consent given by: patient Splint applied by: ER nurse Location details: Right wrist Splint type: Velcro Supplies used: Velcro wrist splint Post-procedure: The splinted body part was neurovascularly unchanged following the procedure. Patient tolerance: Patient tolerated the procedure well with no immediate complications.     Procedures  Critical Care performed: no  Observation: no ____________________________________________   INITIAL IMPRESSION / ASSESSMENT AND PLAN / ED COURSE  Pertinent labs & imaging results that were available during  my care of the patient were reviewed by me and considered in my medical decision making (see chart for details).  The patient arrives hemodynamically stable and very well-appearing.  She is neurologically intact.  She did have a fall which is consistent with frequent falls she has had at home secondary to her neuromuscular disease.  Head CT is fortunately negative for intracerebral hemorrhage.  X-rays negative for  fracture but she has been splinted for comfort.  The patient is requesting 5 mg of prednisone a day as she says this has helped her arthritis in the past.  I will only provide her 1 week supply and she understands to follow-up with her doctor Dr. Rogers Blocker later on this week for reevaluation.  She is discharged home in improved condition with her daughter verbalized understanding and agreement with the plan.      ____________________________________________   FINAL CLINICAL IMPRESSION(S) / ED DIAGNOSES  Final diagnoses:  Fall, initial encounter  Contusion of right hand, initial encounter      NEW MEDICATIONS STARTED DURING THIS VISIT:  This SmartLink is deprecated. Use AVSMEDLIST instead to display the medication list for a patient.   Note:  This document was prepared using Dragon voice recognition software and may include unintentional dictation errors.     Darel Hong, MD 12/28/16 1459

## 2016-12-28 NOTE — ED Triage Notes (Signed)
Pt presents via POV s/p fall. Reports tailbone pain and right hand pain. Reports hitting head on fridge, denies LOC. Hx muscular disorder with hx falling x3 since October.

## 2017-01-02 ENCOUNTER — Encounter: Payer: Self-pay | Admitting: Family Medicine

## 2017-01-05 ENCOUNTER — Ambulatory Visit: Payer: Medicare Other | Admitting: Primary Care

## 2017-01-07 ENCOUNTER — Telehealth: Payer: Self-pay | Admitting: Family Medicine

## 2017-01-07 NOTE — Telephone Encounter (Signed)
Copied from [place]. Topic: Quick Communication - See Telephone Encounter >> Jan 07, 2017  3:24 PM Cleaster Cor[place], [place] wrote: CRM for notification. See Telephone encounter for:   01/07/17. [facility] called and needs new rx. Fax over for pt. (freestyle lite) also [facility] asked to specify amount of times a day pt. Needs to check sugar per day(insurance purpose)   [id number] - [place], [place] - [address] [place] [address] Phone: [phone] Fax: [phone] Not a 24 hour pharmacy; exact hours not known

## 2017-01-08 MED ORDER — GLUCOSE BLOOD VI STRP: ORAL_STRIP | 1 refills | Status: DC

## 2017-01-08 NOTE — Telephone Encounter (Signed)
Pharmacy called back to ask that you e-scribe or fax the script, rx needs more specific directions (example 3 x /day or 4 x / day) and dx code; all on the same prescription. Please resend

## 2017-01-08 NOTE — Addendum Note (Signed)
Addended by: Tammi Sou on: 01/08/2017 10:14 AM   Modules accepted: Orders

## 2017-01-08 NOTE — Telephone Encounter (Signed)
Left VM letting pharmacy know test strips were sent in already and to call if they were referring to something else since phone note didn't say if it was test strips or pt needing whole meter

## 2017-01-08 NOTE — Telephone Encounter (Signed)
Spoke with pharmacy and due to pt's insurance the Rx can't say once daily and as needed. I had to take the as needed part off and resend it

## 2017-01-26 ENCOUNTER — Encounter: Payer: Self-pay | Admitting: Family Medicine

## 2017-01-26 ENCOUNTER — Ambulatory Visit (INDEPENDENT_AMBULATORY_CARE_PROVIDER_SITE_OTHER): Payer: Medicare Other | Admitting: Family Medicine

## 2017-01-26 VITALS — BP 128/64 | HR 92 | Temp 98.5°F | Ht <= 58 in | Wt 168.5 lb

## 2017-01-26 DIAGNOSIS — J019 Acute sinusitis, unspecified: Secondary | ICD-10-CM | POA: Insufficient documentation

## 2017-01-26 DIAGNOSIS — J0100 Acute maxillary sinusitis, unspecified: Secondary | ICD-10-CM | POA: Diagnosis not present

## 2017-01-26 MED ORDER — BENZONATATE 200 MG PO CAPS: 200.0000 mg | ORAL_CAPSULE | Freq: Three times a day (TID) | ORAL | 1 refills | Status: DC | PRN

## 2017-01-26 MED ORDER — AZITHROMYCIN 250 MG PO TABS: ORAL_TABLET | ORAL | 0 refills | Status: DC

## 2017-01-26 NOTE — Assessment & Plan Note (Signed)
S/p uri  Disc symptomatic care - see instructions on AVS  zpack px  Tessalon prn for cough  Rest/fluids Update if not starting to improve in a week or if worsening

## 2017-01-26 NOTE — Progress Notes (Signed)
Subjective:    Patient ID: Lydia King, female    DOB: August 22, 1930, 83 y.o.   MRN: [id number]  HPI Here for uri symptoms incl cough/cong/ST and facial pain   Wt Readings from Last 3 Encounters:  01/26/17 168 lb 8 oz (76.4 kg)  12/28/16 178 lb (80.7 kg)  12/10/16 174 lb 12.8 oz (79.3 kg)   Temp: 98.5 F (36.9 C)   Thursday - started with ST (hurt to swallow and ears burned)   And also a cough  Congested and runny nose - mucous with color to it and thick  Ears pop when she blows her nose  Face hurts both above and below eyes  Worse on the R side than the left   Non prod cough   No fever    Chronic low back pain from arthritis  ? How often can she take tylenol   Is using sinus wash  Also emergen -C   Patient Active Problem List   Diagnosis Date Noted  . Acute sinusitis 01/26/2017  . OSA (obstructive sleep apnea) 01/02/2016  . Pedal edema 01/02/2016  . Hyponatremia 01/02/2016  . Hyperkalemia 01/02/2016  . Fall in home 12/11/2015  . Poor balance 12/11/2015  . Closed head injury 12/11/2015  . Low back pain with sciatica 10/02/2015  . Right hip pain 10/02/2015  . Hearing loss 04/18/2015  . Encounter for Medicare annual wellness exam 10/04/2013  . DM type 2 (diabetes mellitus, type 2) ([place]) 08/31/2012  . CVA 09/12/2008  . OSTEOARTHRITIS, MULTIPLE JOINTS 05/30/2008  . Osteopenia 05/30/2008  . Vitamin D deficiency 07/14/2007  . SLEEP APNEA 07/14/2007  . Hyperlipidemia 05/26/2007  . Essential hypertension 11/30/2006  . Asthma 11/30/2006  . H/O gastroesophageal reflux (GERD) 11/30/2006  . G I BLEED 11/30/2006  . DEEP VENOUS THROMBOPHLEBITIS, HX OF 11/30/2006   Past Medical History:  Diagnosis Date  . Anemia    in past (from blood loss at hiatal hernia)  . Asthma   . Charcot-Marie-Tooth disease    walks with cane (fairly controlled)  . Diverticulosis   . DVT (deep venous thrombosis) ([place]) 2003   after sx  . GERD (gastroesophageal reflux disease)   .  Grief reaction    on fluoxetine  . Headache(784.0)    migraine  . Hiatal hernia   . Hyperlipidemia   . Hypertension   . OA (ocular albinism) ([place])   . Osteopenia   . PUD (peptic ulcer disease)    gastric, past  . Shingles   . Stroke ([place]) 8/10   posterior reversible encephalopathy syndrome  . Urine incontinence    Past Surgical History:  Procedure Laterality Date  . carotid dopplers  8/10  . COLONOSCOPY  2002  . HERNIA REPAIR  2002   bleeding  . HIATAL HERNIA REPAIR  2000   needed blood transfusion  . Stroke  8/10   hosp stroke/ posterior (vs posterior reversible encephalopathy syndrome) - d/c on Plavix  . TEE normal  3/82   no emoblic source  . TOTAL KNEE ARTHROPLASTY  03,06   Bilateral  . VESICOVAGINAL FISTULA CLOSURE W/ TAH  1964   with bleeding and cervical dysplasia   Social History   Tobacco Use  . Smoking status: Never Smoker  . Smokeless tobacco: Never Used  Substance Use Topics  . Alcohol use: No  . Drug use: No   Family History  Problem Relation Age of Onset  . Cerebral aneurysm Son   . Cerebral aneurysm Father   .  Hyperlipidemia Father   . Alcohol abuse Father   . Stroke Mother   . Cervical cancer Sister   . Hyperlipidemia Brother   . Alcohol abuse Brother   . Stroke Brother   . Diabetes Brother   . Diabetes Sister   . Sarcoidosis Sister   . Other Sister        OP/ Broken hip  . Other Unknown        whole family has charcot marie tooth  . Ovarian cancer Sister    Allergies  Allergen Reactions  . Shrimp [Shellfish Allergy] Anaphylaxis    Anaphylaxis-swelling of throat/sob    . Amlodipine Besylate     REACTION: edema  . Atorvastatin     REACTION: pain- could not walk  . Ciprofloxacin     REACTION: unkown  . Codeine     REACTION: itchy rash  . Influenza Vaccines Other (See Comments)    Arm swelling, redness, chills, fever  . Nitrofurantoin   . Omeprazole     REACTION: not effective  . Pantoprazole Sodium     REACTION: does not  work well  . Penicillins     REACTION: itchy rash  . Prednisone     REACTION: RASH.// SHORTNESS OF BREATH  . Simvastatin     REACTION: leg pain  . Sulfonamide Derivatives     REACTION: itchy rash   Current Outpatient Medications on File Prior to Visit  Medication Sig Dispense Refill  . acetaminophen (TYLENOL) 650 MG CR tablet Take 650 mg by mouth every 8 (eight) hours as needed for pain.    . Cholecalciferol (VITAMIN D) 2000 UNITS CAPS Take 2 capsules by mouth daily.      . clopidogrel (PLAVIX) 75 MG tablet Take 1 tablet (75 mg total) by mouth daily. 90 tablet 3  . cyclobenzaprine (FLEXERIL) 10 MG tablet TAKE 1/2 TO 1 TABLET BY MOUTH 3 TIMES A DAY AS NEEDED FOR MUSCLE SPASMS(WATCH FOR SEDATION AND FALLS 270 tablet 1  . esomeprazole (NEXIUM) 40 MG capsule Take 1 capsule (40 mg total) by mouth daily before breakfast. 90 capsule 3  . Flaxseed, Linseed, (FLAX SEED OIL) 1000 MG CAPS Take 1 capsule by mouth daily.      Marland Kitchen FLUoxetine (PROZAC) 20 MG capsule Take 1 capsule (20 mg total) by mouth daily. 90 capsule 3  . furosemide (LASIX) 20 MG tablet Take 2 tablets (40 mg total) by mouth daily. 180 tablet 3  . glucose blood (FREESTYLE LITE) test strip Use to check blood sugar once daily for DM (dx. E11.618) 100 each 1  . losartan (COZAAR) 100 MG tablet TAKE 1/2 TAB BY MOUTH ONCE A DAY 45 tablet 3  . metFORMIN (GLUCOPHAGE) 1000 MG tablet Take 1 tablet (1,000 mg total) by mouth 2 (two) times daily with a meal. 180 tablet 3  . Misc. Devices (WALKER) MISC Wheeled walker with seat- Pt must use for ambulation-- for use as directed: ambulation dx: 729.5, 780.79, 786.05, 784.0, 715.89.     Marland Kitchen omega-3 acid ethyl esters (LOVAZA) 1 g capsule TAKE 1 CAPSULE (1 G TOTAL) BY MOUTH DAILY. 90 capsule 2  . PROAIR HFA 108 (90 Base) MCG/ACT inhaler INHALE 2 PUFFS INTO THE LUNGS EVERY 4 (FOUR) HOURS AS NEEDED FOR WHEEZING OR SHORTNESS OF BREATH. 8.5 g 3  . rosuvastatin (CRESTOR) 10 MG tablet TAKE 1 TABLET BY MOUTH AT  BEDTIME 90 tablet 1   No current facility-administered medications on file prior to visit.     Review of  Systems  Constitutional: Positive for appetite change. Negative for fatigue and fever.  HENT: Positive for congestion, ear pain, postnasal drip, rhinorrhea, sinus pressure and sore throat. Negative for nosebleeds.   Eyes: Negative for pain, redness and itching.  Respiratory: Positive for cough. Negative for shortness of breath and wheezing.   Cardiovascular: Negative for chest pain.  Gastrointestinal: Negative for abdominal pain, diarrhea, nausea and vomiting.  Endocrine: Negative for polyuria.  Genitourinary: Negative for dysuria, frequency and urgency.  Musculoskeletal: Negative for arthralgias and myalgias.  Allergic/Immunologic: Negative for immunocompromised state.  Neurological: Positive for headaches. Negative for dizziness, tremors, syncope, weakness and numbness.  Hematological: Negative for adenopathy. Does not bruise/bleed easily.  Psychiatric/Behavioral: Negative for dysphoric mood. The patient is not nervous/anxious.        Objective:   Physical Exam  Constitutional: She appears well-developed and well-nourished. No distress.  HENT:  Head: Normocephalic and atraumatic.  Right Ear: External ear normal.  Left Ear: External ear normal.  Mouth/Throat: Oropharynx is clear and moist. No oropharyngeal exudate.  Nares are injected and congested  Bilateral maxillary sinus tenderness (less so frontal)  Post nasal drip   Eyes: Conjunctivae and EOM are normal. Pupils are equal, round, and reactive to light. Right eye exhibits no discharge. Left eye exhibits no discharge.  Neck: Normal range of motion. Neck supple.  Cardiovascular: Normal rate and regular rhythm.  Pulmonary/Chest: Effort normal and breath sounds normal. No respiratory distress. She has no wheezes. She has no rales.  Good air exch  No rales or rhonchi  Musculoskeletal:  Poor rom and slow gait from chronic  arthritis pain   Lymphadenopathy:    She has no cervical adenopathy.  Neurological: She is alert. No cranial nerve deficit.  Skin: Skin is warm and dry. No rash noted.  Psychiatric: She has a normal mood and affect.          Assessment & Plan:   Problem List Items Addressed This Visit      Respiratory   Acute sinusitis - Primary    S/p uri  Disc symptomatic care - see instructions on AVS  zpack px  Tessalon prn for cough  Rest/fluids Update if not starting to improve in a week or if worsening        Relevant Medications   azithromycin (ZITHROMAX Z-PAK) 250 MG tablet   benzonatate (TESSALON) 200 MG capsule

## 2017-01-26 NOTE — Patient Instructions (Addendum)
You can take tylenol arthritis every 8 hours for back pain  Drink lots of fluids Take the zpak for sinus infection  For cough with cold- tessalon as directed  mucinex DM or robitussin DM for additional cough if needed    Update if not starting to improve in a week or if worsening

## 2017-01-26 NOTE — Assessment & Plan Note (Signed)
She cannot safely take nsaid Disc tylenol arthritis strength- 1 pill Q8 hour is ok to take (keeping track of dosing)

## 2017-02-07 ENCOUNTER — Other Ambulatory Visit: Payer: Self-pay | Admitting: Family Medicine

## 2017-02-09 NOTE — Telephone Encounter (Signed)
CPE scheduled on 07/22/17, last filled on 07/18/16 #270 tabs with 1 additional refill, please advise

## 2017-02-09 NOTE — Telephone Encounter (Signed)
Will refill electronically  

## 2017-02-22 ENCOUNTER — Emergency Department: Payer: Medicare Other

## 2017-02-22 ENCOUNTER — Other Ambulatory Visit: Payer: Self-pay

## 2017-02-22 ENCOUNTER — Encounter: Payer: Self-pay | Admitting: Family Medicine

## 2017-02-22 ENCOUNTER — Emergency Department
Admission: EM | Admit: 2017-02-22 | Discharge: 2017-02-22 | Disposition: A | Discharge status: Home / Self Care | Payer: Medicare Other | Attending: Student in an Organized Health Care Education/Training Program | Admitting: Student in an Organized Health Care Education/Training Program

## 2017-02-22 ENCOUNTER — Encounter: Payer: Self-pay | Admitting: Emergency Medicine

## 2017-02-22 DIAGNOSIS — Z79899 Other long term (current) drug therapy: Secondary | ICD-10-CM | POA: Insufficient documentation

## 2017-02-22 DIAGNOSIS — I10 Essential (primary) hypertension: Secondary | ICD-10-CM | POA: Insufficient documentation

## 2017-02-22 DIAGNOSIS — S46911A Strain of unspecified muscle, fascia and tendon at shoulder and upper arm level, right arm, initial encounter: Secondary | ICD-10-CM | POA: Diagnosis not present

## 2017-02-22 DIAGNOSIS — Y92003 Bedroom of unspecified non-institutional (private) residence as the place of occurrence of the external cause: Secondary | ICD-10-CM | POA: Diagnosis not present

## 2017-02-22 DIAGNOSIS — Y999 Unspecified external cause status: Secondary | ICD-10-CM | POA: Diagnosis not present

## 2017-02-22 DIAGNOSIS — W19XXXA Unspecified fall, initial encounter: Secondary | ICD-10-CM

## 2017-02-22 DIAGNOSIS — Y9389 Activity, other specified: Secondary | ICD-10-CM | POA: Insufficient documentation

## 2017-02-22 DIAGNOSIS — E119 Type 2 diabetes mellitus without complications: Secondary | ICD-10-CM | POA: Diagnosis not present

## 2017-02-22 DIAGNOSIS — S4991XA Unspecified injury of right shoulder and upper arm, initial encounter: Secondary | ICD-10-CM | POA: Diagnosis not present

## 2017-02-22 DIAGNOSIS — J45909 Unspecified asthma, uncomplicated: Secondary | ICD-10-CM | POA: Insufficient documentation

## 2017-02-22 DIAGNOSIS — Z7984 Long term (current) use of oral hypoglycemic drugs: Secondary | ICD-10-CM | POA: Diagnosis not present

## 2017-02-22 DIAGNOSIS — Z96653 Presence of artificial knee joint, bilateral: Secondary | ICD-10-CM | POA: Diagnosis not present

## 2017-02-22 DIAGNOSIS — Z7902 Long term (current) use of antithrombotics/antiplatelets: Secondary | ICD-10-CM | POA: Diagnosis not present

## 2017-02-22 DIAGNOSIS — S0990XA Unspecified injury of head, initial encounter: Secondary | ICD-10-CM | POA: Insufficient documentation

## 2017-02-22 DIAGNOSIS — M25521 Pain in right elbow: Secondary | ICD-10-CM | POA: Diagnosis not present

## 2017-02-22 DIAGNOSIS — M25511 Pain in right shoulder: Secondary | ICD-10-CM | POA: Diagnosis not present

## 2017-02-22 DIAGNOSIS — W06XXXA Fall from bed, initial encounter: Secondary | ICD-10-CM | POA: Diagnosis not present

## 2017-02-22 DIAGNOSIS — S59901A Unspecified injury of right elbow, initial encounter: Secondary | ICD-10-CM | POA: Diagnosis not present

## 2017-02-22 NOTE — ED Notes (Addendum)
See triage note states she had stockings on and tried to put on her shoes  Slid out of bed  Hit her right cheek on shoulder  Abrasion noted to cheek area.  States area is not painful  But having some pain in right shoulder area

## 2017-02-22 NOTE — ED Provider Notes (Signed)
[facility] Emergency Department Provider Note  ____________________________________________   None    (approximate)  I have reviewed the triage vital signs and the nursing notes.   HISTORY  Chief Complaint Fall    HPI Lydia King is a 84 y.o. female who presents to the emergency department with her daughter.  She states she was in her stockings and was sitting on the edge of the bed and could not stop herself from biting down.  She denies any pain in the hips.  She did hit her head on her arm.  She states her shoulder hurts a little bit and may be some in the elbow.  They called her doctor's office and they wanted her to come here to get a CT of the head because she is on a blood thinner.  The daughter states the mother has not had any change level of consciousness.  She is been laughing and joking with her as normal.  Past Medical History:  Diagnosis Date  . Anemia    in past (from blood loss at hiatal hernia)  . Asthma   . Charcot-Marie-Tooth disease    walks with cane (fairly controlled)  . Diverticulosis   . DVT (deep venous thrombosis) ([place]) 2003   after sx  . GERD (gastroesophageal reflux disease)   . Grief reaction    on fluoxetine  . Headache(784.0)    migraine  . Hiatal hernia   . Hyperlipidemia   . Hypertension   . OA (ocular albinism) ([place])   . Osteopenia   . PUD (peptic ulcer disease)    gastric, past  . Shingles   . Stroke ([place]) 8/10   posterior reversible encephalopathy syndrome  . Urine incontinence     Patient Active Problem List   Diagnosis Date Noted  . Acute sinusitis 01/26/2017  . OSA (obstructive sleep apnea) 01/02/2016  . Pedal edema 01/02/2016  . Hyponatremia 01/02/2016  . Hyperkalemia 01/02/2016  . Fall in home 12/11/2015  . Poor balance 12/11/2015  . Closed head injury 12/11/2015  . Low back pain with sciatica 10/02/2015  . Right hip pain 10/02/2015  . Hearing loss 04/18/2015  . Encounter for  Medicare annual wellness exam 10/04/2013  . DM type 2 (diabetes mellitus, type 2) ([place]) 08/31/2012  . CVA 09/12/2008  . OSTEOARTHRITIS, MULTIPLE JOINTS 05/30/2008  . Osteopenia 05/30/2008  . Vitamin D deficiency 07/14/2007  . SLEEP APNEA 07/14/2007  . Hyperlipidemia 05/26/2007  . Essential hypertension 11/30/2006  . Asthma 11/30/2006  . H/O gastroesophageal reflux (GERD) 11/30/2006  . G I BLEED 11/30/2006  . DEEP VENOUS THROMBOPHLEBITIS, HX OF 11/30/2006    Past Surgical History:  Procedure Laterality Date  . carotid dopplers  8/10  . COLONOSCOPY  2002  . HERNIA REPAIR  2002   bleeding  . HIATAL HERNIA REPAIR  2000   needed blood transfusion  . Stroke  8/10   hosp stroke/ posterior (vs posterior reversible encephalopathy syndrome) - d/c on Plavix  . TEE normal  7/16   no emoblic source  . TOTAL KNEE ARTHROPLASTY  03,06   Bilateral  . VESICOVAGINAL FISTULA CLOSURE W/ TAH  1964   with bleeding and cervical dysplasia    Prior to Admission medications   Medication Sig Start Date End Date Taking? Authorizing Provider  acetaminophen (TYLENOL) 650 MG CR tablet Take 650 mg by mouth every 8 (eight) hours as needed for pain.    [provider]  Cholecalciferol (VITAMIN D) 2000 UNITS  CAPS Take 2 capsules by mouth daily.      [provider]  clopidogrel (PLAVIX) 75 MG tablet Take 1 tablet (75 mg total) by mouth daily. 07/18/16   Tower, Wynelle Fanny, MD  cyclobenzaprine (FLEXERIL) 10 MG tablet TAKE 1/2 TO 1 TABLET BY MOUTH 3 TIMES A DAY AS NEEDED FOR MUSCLE SPASMS(WATCH FOR SEDATION AND FALLS 02/09/17   Tower, Wynelle Fanny, MD  esomeprazole (NEXIUM) 40 MG capsule Take 1 capsule (40 mg total) by mouth daily before breakfast. 07/18/16   Tower, Wynelle Fanny, MD  Flaxseed, Linseed, (FLAX SEED OIL) 1000 MG CAPS Take 1 capsule by mouth daily.      [provider]  FLUoxetine (PROZAC) 20 MG capsule Take 1 capsule (20 mg total) by mouth daily. 07/18/16   Tower, Wynelle Fanny, MD  furosemide  (LASIX) 20 MG tablet Take 2 tablets (40 mg total) by mouth daily. 07/18/16   Tower, Marne A, MD  glucose blood (FREESTYLE LITE) test strip Use to check blood sugar once daily for DM (dx. E11.618) 01/08/17   Tower, Wynelle Fanny, MD  losartan (COZAAR) 100 MG tablet TAKE 1/2 TAB BY MOUTH ONCE A DAY 07/18/16   Tower, Wynelle Fanny, MD  metFORMIN (GLUCOPHAGE) 1000 MG tablet Take 1 tablet (1,000 mg total) by mouth 2 (two) times daily with a meal. 07/18/16   Tower, Wynelle Fanny, MD  Misc. Devices (WALKER) MISC Wheeled walker with seat- Pt must use for ambulation-- for use as directed: ambulation dx: 729.5, 780.79, 786.05, 784.0, 715.89.     [provider]  omega-3 acid ethyl esters (LOVAZA) 1 g capsule TAKE 1 CAPSULE (1 G TOTAL) BY MOUTH DAILY. 10/02/16   Tower, Wynelle Fanny, MD  PROAIR HFA 108 [phone] Base) MCG/ACT inhaler INHALE 2 PUFFS INTO THE LUNGS EVERY 4 (FOUR) HOURS AS NEEDED FOR WHEEZING OR SHORTNESS OF BREATH. 03/05/16   Tower, Wynelle Fanny, MD  rosuvastatin (CRESTOR) 10 MG tablet TAKE 1 TABLET BY MOUTH AT BEDTIME 10/08/16   Tower, Wynelle Fanny, MD    Allergies Shrimp [shellfish allergy]; Amlodipine besylate; Atorvastatin; Ciprofloxacin; Codeine; Influenza vaccines; Nitrofurantoin; Omeprazole; Pantoprazole sodium; Penicillins; Prednisone; Simvastatin; and Sulfonamide derivatives  Family History  Problem Relation Age of Onset  . Cerebral aneurysm Son   . Cerebral aneurysm Father   . Hyperlipidemia Father   . Alcohol abuse Father   . Stroke Mother   . Cervical cancer Sister   . Hyperlipidemia Brother   . Alcohol abuse Brother   . Stroke Brother   . Diabetes Brother   . Diabetes Sister   . Sarcoidosis Sister   . Other Sister        OP/ Broken hip  . Other Unknown        whole family has charcot marie tooth  . Ovarian cancer Sister     Social History Social History   Tobacco Use  . Smoking status: Never Smoker  . Smokeless tobacco: Never Used  Substance Use Topics  . Alcohol use: No  . Drug use: No     Review of Systems  Constitutional: No fever/chills, no loss of consciousness Eyes: No visual changes. ENT: No sore throat. Respiratory: Denies cough Genitourinary: Negative for dysuria. Musculoskeletal: Negative for back pain.  Positive for right shoulder and elbow pain Skin: Negative for rash.    ____________________________________________   PHYSICAL EXAM:  VITAL SIGNS: ED Triage Vitals  Enc Vitals Group     BP 02/22/17 1148 137/67     Pulse Rate 02/22/17 1148 88  Resp 02/22/17 1148 16     Temp 02/22/17 1148 97.7 F (36.5 C)     Temp Source 02/22/17 1148 Oral     SpO2 02/22/17 1148 98 %     Weight 02/22/17 1149 170 lb (77.1 kg)     Height 02/22/17 1149 5' (1.524 m)     Head Circumference --      Peak Flow --      Pain Score 02/22/17 1148 4     Pain Loc --      Pain Edu? --      Excl. in [place]? --     Constitutional: Alert and oriented. Well appearing and in no acute distress.  Patient is acting appropriate for age.  She was able to answer all questions in an appropriate manner Eyes: Conjunctivae are normal.  Head: Atraumatic.  No swelling or abrasions noted Nose: No congestion/rhinnorhea. Mouth/Throat: Mucous membranes are moist.   Neck: Is supple, there is no tenderness Cardiovascular: Normal rate, regular rhythm. Respiratory: Normal respiratory effort.  No retractions GU: deferred Musculoskeletal: FROM all extremities, warm and well perfused.  Right shoulder is tender at the head of the humerus.  The right elbow is tender at the olecranon.  Patient does have good range of motion and is neurovascularly intact Neurologic:  Normal speech and language.  Skin:  Skin is warm, dry and intact. No rash noted.  Bruising is noted Psychiatric: Mood and affect are normal. Speech and behavior are normal.  ____________________________________________   LABS (all labs ordered are listed, but only abnormal results are displayed)  Labs Reviewed - No data to  display ____________________________________________   ____________________________________________  RADIOLOGY  CT of the head is negative for any acute abnormality X-ray of the right shoulder is negative for fracture Ray of the right elbow is negative for fracture  ____________________________________________   PROCEDURES  Procedure(s) performed: No  Procedures    ____________________________________________   INITIAL IMPRESSION / ASSESSMENT AND PLAN / ED COURSE  Pertinent labs & imaging results that were available during my care of the patient were reviewed by me and considered in my medical decision making (see chart for details).  Patient is 84 year old female presents to the ER with her daughter.  Patient states she had on stockings and could not grip the rug and slid off the edge of the bed.  They are both concerned as the mother is on a blood thinner and hit her head.  However she just hit her head on her shoulder as she slid down.  She also complains of right shoulder and elbow pain  On physical exam she appears well.  There is no trauma noted to the head or the face.  The right shoulder and elbow are little tender.  X-ray of the right shoulder and elbow were ordered.  CT of the head ordered  CT of the head is negative, x-rays of the right shoulder and elbow were negative for fracture.  The results were discussed with the patient and her daughter.  She was instructed to follow-up with her regular doctor if there are any problems.  If she develops a headache or becomes worse she should return to the emergency department.  They both state they understand will comply with recommendation.  Patient was discharged in stable condition     As part of my medical decision making, I reviewed the following data within the [place] History obtained from family, Nursing notes reviewed and incorporated, Radiograph reviewed , Notes  from prior ED visits   ____________________________________________   FINAL CLINICAL IMPRESSION(S) / ED DIAGNOSES  Final diagnoses:  Fall, initial encounter  Minor head injury, initial encounter  Shoulder strain, right, initial encounter      NEW MEDICATIONS STARTED DURING THIS VISIT:  Discharge Medication List as of 02/22/2017  1:46 PM       Note:  This document was prepared using Dragon voice recognition software and may include unintentional dictation errors.    Versie Starks, PA-C 02/22/17 1549    Merlyn Lot, MD 02/25/17 [phone]

## 2017-02-22 NOTE — Discharge Instructions (Signed)
Follow-up with your regular doctor if you are not better in 3-5 days.  Return to the emergency department if you are having any headaches or you are worsening.

## 2017-02-22 NOTE — ED Triage Notes (Signed)
Pt to ED via POV for fall this morning. Pt states that she was sitting on the edge of the bed and slide off. Pt states that she is having pain in her right cheek and right shoulder. Pt in NAD at this time. Pt states that she takes blood thinners.

## 2017-02-23 ENCOUNTER — Telehealth: Payer: Self-pay

## 2017-02-23 NOTE — Telephone Encounter (Signed)
Per chart review tab pt seen [facility] ED on 02/22/17.

## 2017-02-23 NOTE — Telephone Encounter (Signed)
I put the order for cpap supplies in the IN box  Unsure where it needs to go

## 2017-02-23 NOTE — Telephone Encounter (Signed)
PLEASE NOTE: All timestamps contained within this report are represented as [place] Standard Time. CONFIDENTIALTY NOTICE: This fax transmission is intended only for the addressee. It contains information that is legally privileged, confidential or otherwise protected from use or disclosure. If you are not the intended recipient, you are strictly prohibited from reviewing, disclosing, copying using or disseminating any of this information or taking any action in reliance on or regarding this information. If you have received this fax in error, please notify us immediately by telephone so that we can arrange for its return to [place]. Phone: [phone], Toll-Free: [phone], Fax: [phone] Page: 1 of 2 Call Id: [id number] [place] Patient Name: Lydia King Gender: Female DOB: 1930-03-14 Age: 84 Y 73 M 3 D Return Phone Number: [phone] (Primary), [phone] (Secondary) Address: City/State/Zip: [place] [address] Client Wilber Night - Client Client Site [place] Tower, Roque Lias - MD Contact Type Call Who Is Calling Patient / Member / Family / Caregiver Call Type Triage / Clinical Caller Name Sandi Mariscal Relationship To Patient Daughter Return Phone Number [phone] (Primary) Chief Complaint Fall - No Injury Reason for Call Symptomatic / Request for [place] states her mother fell off her bed. She did not hit her head but she is calling to see what to check. Translation No Nurse Assessment Nurse: Kathi Ludwig, RN, Leana Roe Date/Time (Eastern Time): 02/22/2017 10:48:26 AM Confirm and document reason for call. If symptomatic, describe symptoms. ---Caller states mom slid off bed when she sat down. Slid on to carpet. EMS got her up. Did not hit head. Brushed cheek with her arm. Cheek has a  friction burn. Does the patient have any new or worsening symptoms? ---Yes Will a triage be completed? ---Yes Related visit to physician within the last 2 weeks? ---No Does the PT have any chronic conditions? (i.e. diabetes, asthma, etc.) ---Yes List chronic conditions. ---CVA, HTN, diabetes Is this a behavioral health or substance abuse call? ---No Guidelines Guideline Title Affirmed Question Affirmed Notes Nurse Date/Time (Eastern Time) Shoulder Injury [1] High-risk adult (e.g., age > 63, osteoporosis, chronic steroid use) AND [2] still hurts Kathi Ludwig, RN, Leana Roe 02/22/2017 10:53:29 AM Head Injury Taking Coumadin (warfarin) or other strong blood thinner, or known bleeding disorder (e.g., thrombocytopenia) Kathi Ludwig, RN, Leana Roe 02/22/2017 10:56:13 AM PLEASE NOTE: All timestamps contained within this report are represented as [place] Standard Time. CONFIDENTIALTY NOTICE: This fax transmission is intended only for the addressee. It contains information that is legally privileged, confidential or otherwise protected from use or disclosure. If you are not the intended recipient, you are strictly prohibited from reviewing, disclosing, copying using or disseminating any of this information or taking any action in reliance on or regarding this information. If you have received this fax in error, please notify us immediately by telephone so that we can arrange for its return to [place]. Phone: [phone], Toll-Free: [phone], Fax: [phone] Page: 2 of 2 Call Id: [id number] [place]. Time Eilene Ghazi Time) Disposition Final User 02/22/2017 10:55:48 AM See Physician within [place], RN, Leana Roe 02/22/2017 10:59:12 AM Go to ED Now (or PCP triage) Yes Kathi Ludwig, RN, [place]erva Ends Disagree/Comply Comply Caller Understands Yes PreDisposition Call Doctor Care Advice Given Per Guideline SEE PHYSICIAN WITHIN 24 HOURS: * IF OFFICE WILL BE OPEN: You need to be seen within the next 24 hours. Call  your doctor when the office opens, and  make an appointment. FIRST AID ADVICE - SLING: * Use an arm sling to reduce the pain. * You can make a sling with a triangular piece of cloth or you can buy one at a pharmacy. PAIN MEDICINES: ACETAMINOPHEN (E.G., TYLENOL): LOCAL COLD: For bruises or swelling, apply a cold pack or an ice bag (wrapped in a moist towel) to the area for 20 minutes per hour. Repeat for 4 consecutive hours. (Reason: reduce the bleeding and pain) CALL BACK IF: * Pain becomes severe * You become worse. CARE ADVICE given per Shoulder Injury (Adult) guideline. GO TO ED NOW (OR PCP TRIAGE): CARE ADVICE given per Head Injury (Adult) guideline. * IF NO PCP TRIAGE: You need to be seen. Go to the [facility] at [facility] Hospital within the next hour. Leave as soon as you can. Comments User: Estevan Ryder, RN Date/Time Eilene Ghazi Time): 02/22/2017 11:02:16 AM patient is on plavix. States she did not hit head, but has red area on the cheek Referrals REFERRED TO PCP OFFICE REFERRED TO PCP OFFICE [facility]

## 2017-02-24 NOTE — Telephone Encounter (Signed)
Spoke with daughter and she will pick up order tomorrow

## 2017-02-26 ENCOUNTER — Telehealth: Payer: Self-pay | Admitting: *Deleted

## 2017-02-26 NOTE — Telephone Encounter (Signed)
PA done over the phone for pt's cyclobenzaprine, it was approved. Approval letter faxed to pharmacy and placed in Dr. Marliss Coots inbox to sign and send for scanning

## 2017-03-30 ENCOUNTER — Encounter: Payer: Self-pay | Admitting: Family Medicine

## 2017-03-31 LAB — HM DIABETES EYE EXAM

## 2017-04-03 NOTE — Telephone Encounter (Signed)
Please see mychart req regarding this letter and asking for records  thanks

## 2017-04-06 ENCOUNTER — Other Ambulatory Visit: Payer: Self-pay | Admitting: *Deleted

## 2017-04-06 MED ORDER — ROSUVASTATIN CALCIUM 10 MG PO TABS: 10.0000 mg | ORAL_TABLET | Freq: Every day | ORAL | 1 refills | Status: DC

## 2017-04-10 ENCOUNTER — Encounter: Payer: Self-pay | Admitting: Family Medicine

## 2017-04-20 ENCOUNTER — Telehealth: Payer: Self-pay

## 2017-04-20 ENCOUNTER — Encounter: Payer: Self-pay | Admitting: Emergency Medicine

## 2017-04-20 ENCOUNTER — Emergency Department
Admission: EM | Admit: 2017-04-20 | Discharge: 2017-04-20 | Disposition: A | Discharge status: Home / Self Care | Payer: Medicare Other | Attending: Emergency Medicine | Admitting: Emergency Medicine

## 2017-04-20 ENCOUNTER — Other Ambulatory Visit: Payer: Self-pay

## 2017-04-20 DIAGNOSIS — Z7984 Long term (current) use of oral hypoglycemic drugs: Secondary | ICD-10-CM | POA: Insufficient documentation

## 2017-04-20 DIAGNOSIS — H9202 Otalgia, left ear: Secondary | ICD-10-CM | POA: Diagnosis not present

## 2017-04-20 DIAGNOSIS — Z96653 Presence of artificial knee joint, bilateral: Secondary | ICD-10-CM | POA: Insufficient documentation

## 2017-04-20 DIAGNOSIS — I10 Essential (primary) hypertension: Secondary | ICD-10-CM | POA: Insufficient documentation

## 2017-04-20 DIAGNOSIS — J45909 Unspecified asthma, uncomplicated: Secondary | ICD-10-CM | POA: Diagnosis not present

## 2017-04-20 DIAGNOSIS — E119 Type 2 diabetes mellitus without complications: Secondary | ICD-10-CM | POA: Diagnosis not present

## 2017-04-20 DIAGNOSIS — R112 Nausea with vomiting, unspecified: Secondary | ICD-10-CM | POA: Diagnosis not present

## 2017-04-20 DIAGNOSIS — Z79899 Other long term (current) drug therapy: Secondary | ICD-10-CM | POA: Diagnosis not present

## 2017-04-20 DIAGNOSIS — R42 Dizziness and giddiness: Secondary | ICD-10-CM | POA: Insufficient documentation

## 2017-04-20 LAB — BASIC METABOLIC PANEL
Anion gap: 13 (ref 5–15)
BUN: 26 mg/dL — AB (ref 6–20)
CHLORIDE: 96 mmol/L — AB (ref 101–111)
CO2: 24 mmol/L (ref 22–32)
CREATININE: 0.93 mg/dL (ref 0.44–1.00)
Calcium: 8.9 mg/dL (ref 8.9–10.3)
GFR calc Af Amer: >60 mL/min (ref >60)
GFR calc non Af Amer: 55 mL/min — ABNORMAL LOW (ref >60)
GLUCOSE: 170 mg/dL — AB (ref 65–99)
Potassium: 4 mmol/L (ref 3.5–5.1)
Sodium: 133 mmol/L — ABNORMAL LOW (ref 135–145)

## 2017-04-20 LAB — CBC
HCT: 39.1 % (ref 35.0–47.0)
Hemoglobin: 13 g/dL (ref 12.0–16.0)
MCH: 30.3 pg (ref 26.0–34.0)
MCHC: 33.1 g/dL (ref 32.0–36.0)
MCV: 91.7 fL (ref 80.0–100.0)
PLATELETS: 183 10*3/uL (ref 150–440)
RBC: 4.27 MIL/uL (ref 3.80–5.20)
RDW: 13.9 % (ref 11.5–14.5)
WBC: 5.2 10*3/uL (ref 3.6–11.0)

## 2017-04-20 MED ORDER — MECLIZINE HCL 32 MG PO TABS: 32.0000 mg | ORAL_TABLET | Freq: Three times a day (TID) | ORAL | 0 refills | Status: DC | PRN

## 2017-04-20 MED ORDER — AZITHROMYCIN 250 MG PO TABS: ORAL_TABLET | ORAL | 0 refills | Status: AC

## 2017-04-20 MED ORDER — ONDANSETRON 4 MG PO TBDP: 4.0000 mg | ORAL_TABLET | Freq: Three times a day (TID) | ORAL | 0 refills | Status: DC | PRN

## 2017-04-20 NOTE — ED Triage Notes (Signed)
Pt reports that she has had congestion and left ear ache and has been taking medication for it also.

## 2017-04-20 NOTE — Discharge Instructions (Signed)
please return for any worsening of symptoms or any new symptoms. U can try the meclizine one pill up to 4 times a day. Be careful may make you sleepy or wobbly. Do not fall on it. Use the Zithromax as directed for the sinus infection and continue to use salt water nasal spray. you can use the isopropyl alcohol that I gave Ufor nausea or you can use Zofran melt on your tongue pills 3 times a day.

## 2017-04-20 NOTE — Telephone Encounter (Signed)
Lydia King from [facility] called; the triage nurses are busy and pt has N&V and dizziness. Med given is not helping          ( ?Zofran from 11/2016 visit with similar symptoms ). ? Dehydration. Pt to go to UC. No available appts at [facility]. FYI to Dr Glori Bickers.

## 2017-04-20 NOTE — ED Notes (Addendum)
Pt c/o of dizziness. States feels like room is spinning. Worse with movement. Has had this before. Denies it being as severe at this time as it was earlier. States vomited once this morning. Hx vertigo. Denies numbness or tingling.   Alert, oriented. Family at bedside. No distress noted.

## 2017-04-20 NOTE — ED Triage Notes (Signed)
Pt reports that she has a history of vertigo, she states that 0615 this am her room was spinning and her daughter gave her half of Meclizine and Zofran at 0645. She reports that it continues. She states that if she sits or lays still that the dizziness is better.

## 2017-04-20 NOTE — ED Provider Notes (Signed)
[facility] Emergency Department Provider Note   ____________________________________________   First MD Initiated Contact with Patient 04/20/17 1854     (approximate)  I have reviewed the triage vital signs and the nursing notes.   HISTORY  Chief Complaint Dizziness    HPI Lydia King is a 84 y.o. female Who reports she developed spinning and nausea and vomited once this morning. Symptoms are better if she lays still. She also complains of some right-sided sinus pain left earache and stuffy nose with postnasal drip. This spinning movement is worse with movement of her body and or head and is similar to but not as bad as the vertigo that she's had before. She took one of the meclizine she had from before and seemed to get better. She does not have a headache except for the right-sided maxillary sinus pain is not running a fever has no numbness weakness or slurry speech or change in her vision.   Past Medical History:  Diagnosis Date  . Anemia    in past (from blood loss at hiatal hernia)  . Asthma   . Charcot-Marie-Tooth disease    walks with cane (fairly controlled)  . Diverticulosis   . DVT (deep venous thrombosis) ([place]) 2003   after sx  . GERD (gastroesophageal reflux disease)   . Grief reaction    on fluoxetine  . Headache(784.0)    migraine  . Hiatal hernia   . Hyperlipidemia   . Hypertension   . OA (ocular albinism) ([place])   . Osteopenia   . PUD (peptic ulcer disease)    gastric, past  . Shingles   . Stroke ([place]) 8/10   posterior reversible encephalopathy syndrome  . Urine incontinence     Patient Active Problem List   Diagnosis Date Noted  . Acute sinusitis 01/26/2017  . OSA (obstructive sleep apnea) 01/02/2016  . Pedal edema 01/02/2016  . Hyponatremia 01/02/2016  . Hyperkalemia 01/02/2016  . Fall in home 12/11/2015  . Poor balance 12/11/2015  . Closed head injury 12/11/2015  . Low back pain with sciatica 10/02/2015  .  Right hip pain 10/02/2015  . Hearing loss 04/18/2015  . Encounter for Medicare annual wellness exam 10/04/2013  . DM type 2 (diabetes mellitus, type 2) ([place]) 08/31/2012  . CVA 09/12/2008  . OSTEOARTHRITIS, MULTIPLE JOINTS 05/30/2008  . Osteopenia 05/30/2008  . Vitamin D deficiency 07/14/2007  . SLEEP APNEA 07/14/2007  . Hyperlipidemia 05/26/2007  . Essential hypertension 11/30/2006  . Asthma 11/30/2006  . H/O gastroesophageal reflux (GERD) 11/30/2006  . G I BLEED 11/30/2006  . DEEP VENOUS THROMBOPHLEBITIS, HX OF 11/30/2006    Past Surgical History:  Procedure Laterality Date  . carotid dopplers  8/10  . COLONOSCOPY  2002  . HERNIA REPAIR  2002   bleeding  . HIATAL HERNIA REPAIR  2000   needed blood transfusion  . Stroke  8/10   hosp stroke/ posterior (vs posterior reversible encephalopathy syndrome) - d/c on Plavix  . TEE normal  3/66   no emoblic source  . TOTAL KNEE ARTHROPLASTY  03,06   Bilateral  . VESICOVAGINAL FISTULA CLOSURE W/ TAH  1964   with bleeding and cervical dysplasia    Prior to Admission medications   Medication Sig Start Date End Date Taking? Authorizing Provider  acetaminophen (TYLENOL) 650 MG CR tablet Take 650 mg by mouth every 8 (eight) hours as needed for pain.    [provider]  azithromycin (ZITHROMAX Z-PAK) 250 MG tablet  Take 2 tablets (500 mg) on  Day 1,  followed by 1 tablet (250 mg) once daily on Days 2 through 5. 04/20/17 04/25/17  Nena Polio, MD  Cholecalciferol (VITAMIN D) 2000 UNITS CAPS Take 2 capsules by mouth daily.      [provider]  clopidogrel (PLAVIX) 75 MG tablet Take 1 tablet (75 mg total) by mouth daily. 07/18/16   Tower, Wynelle Fanny, MD  cyclobenzaprine (FLEXERIL) 10 MG tablet TAKE 1/2 TO 1 TABLET BY MOUTH 3 TIMES A DAY AS NEEDED FOR MUSCLE SPASMS(WATCH FOR SEDATION AND FALLS 02/09/17   Tower, Wynelle Fanny, MD  esomeprazole (NEXIUM) 40 MG capsule Take 1 capsule (40 mg total) by mouth daily before breakfast. 07/18/16    Tower, Wynelle Fanny, MD  Flaxseed, Linseed, (FLAX SEED OIL) 1000 MG CAPS Take 1 capsule by mouth daily.      [provider]  FLUoxetine (PROZAC) 20 MG capsule Take 1 capsule (20 mg total) by mouth daily. 07/18/16   Tower, Wynelle Fanny, MD  furosemide (LASIX) 20 MG tablet Take 2 tablets (40 mg total) by mouth daily. 07/18/16   Tower, Marne A, MD  glucose blood (FREESTYLE LITE) test strip Use to check blood sugar once daily for DM (dx. E11.618) 01/08/17   Tower, Wynelle Fanny, MD  losartan (COZAAR) 100 MG tablet TAKE 1/2 TAB BY MOUTH ONCE A DAY 07/18/16   Tower, Wynelle Fanny, MD  meclizine (ANTIVERT) 32 MG tablet Take 1 tablet (32 mg total) by mouth 3 (three) times daily as needed. alternatively you can take one 25 mg tablet 4 times a day. Be careful it may make you sleepy 04/20/17   Nena Polio, MD  metFORMIN (GLUCOPHAGE) 1000 MG tablet Take 1 tablet (1,000 mg total) by mouth 2 (two) times daily with a meal. 07/18/16   Tower, Wynelle Fanny, MD  Misc. Devices (WALKER) MISC Wheeled walker with seat- Pt must use for ambulation-- for use as directed: ambulation dx: 729.5, 780.79, 786.05, 784.0, 715.89.     [provider]  omega-3 acid ethyl esters (LOVAZA) 1 g capsule TAKE 1 CAPSULE (1 G TOTAL) BY MOUTH DAILY. 10/02/16   Tower, Wynelle Fanny, MD  ondansetron (ZOFRAN ODT) 4 MG disintegrating tablet Take 1 tablet (4 mg total) by mouth every 8 (eight) hours as needed for nausea or vomiting. 04/20/17   Nena Polio, MD  PROAIR HFA 108 [phone] Base) MCG/ACT inhaler INHALE 2 PUFFS INTO THE LUNGS EVERY 4 (FOUR) HOURS AS NEEDED FOR WHEEZING OR SHORTNESS OF BREATH. 03/05/16   Tower, Wynelle Fanny, MD  rosuvastatin (CRESTOR) 10 MG tablet Take 1 tablet (10 mg total) by mouth at bedtime. 04/06/17   Tower, Wynelle Fanny, MD    Allergies Shrimp [shellfish allergy]; Amlodipine besylate; Atorvastatin; Ciprofloxacin; Codeine; Influenza vaccines; Nitrofurantoin; Omeprazole; Pantoprazole sodium; Penicillins; Prednisone; Simvastatin; and Sulfonamide  derivatives  Family History  Problem Relation Age of Onset  . Cerebral aneurysm Son   . Cerebral aneurysm Father   . Hyperlipidemia Father   . Alcohol abuse Father   . Stroke Mother   . Cervical cancer Sister   . Hyperlipidemia Brother   . Alcohol abuse Brother   . Stroke Brother   . Diabetes Brother   . Diabetes Sister   . Sarcoidosis Sister   . Other Sister        OP/ Broken hip  . Other Unknown        whole family has charcot marie tooth  . Ovarian cancer Sister  Social History Social History   Tobacco Use  . Smoking status: Never Smoker  . Smokeless tobacco: Never Used  Substance Use Topics  . Alcohol use: No  . Drug use: No    Review of Systems  Constitutional: No fever/chills Eyes: No visual changes. ENT: No sore throat. Cardiovascular: Denies chest pain. Respiratory: Denies shortness of breath. Gastrointestinal: No abdominal pain.   nausea,  vomiting.  No diarrhea.  No constipation. Genitourinary: Negative for dysuria. Musculoskeletal: Negative for back pain. Skin: Negative for rash. Neurological: Negative for headaches, focal weakness   ____________________________________________   PHYSICAL EXAM:  VITAL SIGNS: ED Triage Vitals  Enc Vitals Group     BP 04/20/17 1717 130/67     Pulse Rate 04/20/17 1717 88     Resp 04/20/17 1717 20     Temp 04/20/17 1717 97.9 F (36.6 C)     Temp Source 04/20/17 1717 Oral     SpO2 04/20/17 1717 92 %     Weight 04/20/17 1718 170 lb (77.1 kg)     Height 04/20/17 1718 4\' 11"  (1.499 m)     Head Circumference --      Peak Flow --      Pain Score 04/20/17 1717 7     Pain Loc --      Pain Edu? --      Excl. in [place]? --     Constitutional: Alert and oriented. Well appearing and in no acute distress. Eyes: Conjunctivae are normal. PERRL. EOMI.fundi look normal Head: Atraumatic. Nose: No congestion/rhinnorhea. Mouth/Throat: Mucous membranes are moist.  Oropharynx non-erythematous. Neck: No  stridor. Cardiovascular: Normal rate, regular rhythm. Grossly normal heart sounds.  Good peripheral circulation. Respiratory: Normal respiratory effort.  No retractions. Lungs CTAB. Gastrointestinal: Soft and nontender. No distention. No abdominal bruits. No CVA tenderness. Musculoskeletal: No lower extremity tenderness some edema.  No joint effusions. Neurologic:  Normal speech and language. No gross focal neurologic deficits are appreciated. radial nerves II through XII are intact, visual fields were not checked. Patient reports her vision is baseline. Finger-nose rapid alternating movements and hands are normal motor strength is 5 over 5 throughout there is no numbness..patient has a small amount of nystatin this with positional changes of her head. There is fatigability and latency. Additionally on and pulse testing the patient's eyes do move off target and back on. This is all suggestive of a peripheral lesion as is her history with the nausea vomiting and symptoms that are similar to her prior episode of vertigo Skin:  Skin is warm, dry and intact. No rash noted. Psychiatric: Mood and affect are normal. Speech and behavior are normal.  ____________________________________________   LABS (all labs ordered are listed, but only abnormal results are displayed)  Labs Reviewed  BASIC METABOLIC PANEL - Abnormal; Notable for the following components:      Result Value   Sodium 133 (*)    Chloride 96 (*)    Glucose, Bld 170 (*)    BUN 26 (*)    GFR calc non Af Amer 55 (*)    All other components within normal limits  CBC  URINALYSIS, COMPLETE (UACMP) WITH MICROSCOPIC  CBG MONITORING, ED   ____________________________________________  EKG   ____________________________________________  RADIOLOGY  ED MD interpretation:   Official radiology report(s): No results found.  ____________________________________________   PROCEDURES  Procedure(s) performed:    Procedures  Critical Care performed:  ____________________________________________   INITIAL IMPRESSION / ASSESSMENT AND PLAN / ED COURSE  please see  above discussion neuro exam section. Also I offered patient a CT of the head but she declines this she wants to go home she understands it may be a risk of stroke but she doesn't think so I really don't think soft this point either. Patient will return for any new symptoms immediately.        ____________________________________________   FINAL CLINICAL IMPRESSION(S) / ED DIAGNOSES  Final diagnoses:  Vertigo     ED Discharge Orders        Ordered    azithromycin (ZITHROMAX Z-PAK) 250 MG tablet     04/20/17 1918    ondansetron (ZOFRAN ODT) 4 MG disintegrating tablet  Every 8 hours PRN     04/20/17 1918    meclizine (ANTIVERT) 32 MG tablet  3 times daily PRN     04/20/17 1918       Note:  This document was prepared using Dragon voice recognition software and may include unintentional dictation errors.    Nena Polio, MD 04/20/17 [phone]

## 2017-05-06 ENCOUNTER — Ambulatory Visit (INDEPENDENT_AMBULATORY_CARE_PROVIDER_SITE_OTHER): Payer: Medicare Other | Admitting: Family Medicine

## 2017-05-06 ENCOUNTER — Encounter: Payer: Self-pay | Admitting: Family Medicine

## 2017-05-06 VITALS — BP 122/70 | HR 85 | Temp 98.1°F | Ht <= 58 in | Wt 174.5 lb

## 2017-05-06 DIAGNOSIS — J0100 Acute maxillary sinusitis, unspecified: Secondary | ICD-10-CM

## 2017-05-06 DIAGNOSIS — E11618 Type 2 diabetes mellitus with other diabetic arthropathy: Secondary | ICD-10-CM

## 2017-05-06 DIAGNOSIS — I10 Essential (primary) hypertension: Secondary | ICD-10-CM | POA: Diagnosis not present

## 2017-05-06 DIAGNOSIS — R42 Dizziness and giddiness: Secondary | ICD-10-CM | POA: Diagnosis not present

## 2017-05-06 DIAGNOSIS — H8113 Benign paroxysmal vertigo, bilateral: Secondary | ICD-10-CM

## 2017-05-06 MED ORDER — PREDNISONE 10 MG PO TABS: ORAL_TABLET | ORAL | 0 refills | Status: DC

## 2017-05-06 MED ORDER — AZITHROMYCIN 250 MG PO TABS: ORAL_TABLET | ORAL | 0 refills | Status: DC

## 2017-05-06 NOTE — Assessment & Plan Note (Addendum)
Little to no imp with zpak from ED Reviewed hospital records, lab results and studies in detail   Still having vertigo and severe congestion Will try low dose course of prednisone (20 mg taper) as well as rep zpak (has other abx allergies) Safety disc in light of vertigo -change positions slowly  Update if not starting to improve in a week or if worsening  Consider ENT if no imp

## 2017-05-06 NOTE — Patient Instructions (Addendum)
Be careful about changing position slowly  Take the prednisone as directed -this will help both the sinusitis and the vertigo  Take another course of zithromax as well   Take care of yourself   The prednisone will make you hyper and hungry and anxious /you may have trouble sleeping Also your blood sugar will go up so watch your diet

## 2017-05-06 NOTE — Progress Notes (Signed)
Subjective:    Patient ID: Lydia King, female    DOB: [birth year], 84 y.o.   MRN: [id number]  HPI Here for f/u of ED visit for vertigo 3/25 She presented with spinning dizziness and vomiting along with R sided sinus pain /cong and ear ache  She did take one meclizine pill    BP Readings from Last 3 Encounters:  05/06/17 122/70  04/20/17 (!) 152/72  02/22/17 137/67   Pulse Readings from Last 3 Encounters:  05/06/17 85  04/20/17 86  02/22/17 88   Results for orders placed or performed during the hospital encounter of 44/31/54  Basic metabolic panel  Result Value Ref Range   Sodium 133 (L) 135 - 145 mmol/L   Potassium 4.0 3.5 - 5.1 mmol/L   Chloride 96 (L) 101 - 111 mmol/L   CO2 24 22 - 32 mmol/L   Glucose, Bld 170 (H) 65 - 99 mg/dL   BUN 26 (H) 6 - 20 mg/dL   Creatinine, Ser 0.93 0.44 - 1.00 mg/dL   Calcium 8.9 8.9 - 10.3 mg/dL   GFR calc non Af Amer 55 (L) >60 mL/min   GFR calc Af Amer >60 >60 mL/min   Anion gap 13 5 - 15  CBC  Result Value Ref Range   WBC 5.2 3.6 - 11.0 K/uL   RBC 4.27 3.80 - 5.20 MIL/uL   Hemoglobin 13.0 12.0 - 16.0 g/dL   HCT 39.1 35.0 - 47.0 %   MCV 91.7 80.0 - 100.0 fL   MCH 30.3 26.0 - 34.0 pg   MCHC 33.1 32.0 - 36.0 g/dL   RDW 13.9 11.5 - 14.5 %   Platelets 183 150 - 440 K/uL    She was treated with zithromax (for suspected sinusitis), zofran and meclizine   Face is still sore  Eyes ache and nose hurts  Post nasal drip  No fever  Nasal- cong and runny nose  Mucous - a lot but cannot tell what color  Thinks the sinus infection is worse than when she went to the hospital   She has a hx of sinusitis  Has had to have steroids in the past    She declined CT of the head   Since then she still exp dizziness  Worse with positional changes  No more vomiting but nauseated  She is taking the meclizine  Only takes zofran when abs necessary   Has never seen ENT   Does have pollen allergies   Patient Active Problem List   Diagnosis Date Noted  . Vertigo 05/06/2017  . Acute sinusitis 01/26/2017  . OSA (obstructive sleep apnea) 01/02/2016  . Pedal edema 01/02/2016  . Hyponatremia 01/02/2016  . Hyperkalemia 01/02/2016  . Fall in home 12/11/2015  . Poor balance 12/11/2015  . Closed head injury 12/11/2015  . Low back pain with sciatica 10/02/2015  . Right hip pain 10/02/2015  . Hearing loss 04/18/2015  . Encounter for Medicare annual wellness exam 10/04/2013  . DM type 2 (diabetes mellitus, type 2) ([place]) 08/31/2012  . CVA 09/12/2008  . OSTEOARTHRITIS, MULTIPLE JOINTS 05/30/2008  . Osteopenia 05/30/2008  . Vitamin D deficiency 07/14/2007  . SLEEP APNEA 07/14/2007  . Hyperlipidemia 05/26/2007  . Essential hypertension 11/30/2006  . Asthma 11/30/2006  . H/O gastroesophageal reflux (GERD) 11/30/2006  . G I BLEED 11/30/2006  . DEEP VENOUS THROMBOPHLEBITIS, HX OF 11/30/2006   Past Medical History:  Diagnosis Date  . Anemia    in past (from blood loss  at hiatal hernia)  . Asthma   . Charcot-Marie-Tooth disease    walks with cane (fairly controlled)  . Diverticulosis   . DVT (deep venous thrombosis) ([place]) 2003   after sx  . GERD (gastroesophageal reflux disease)   . Grief reaction    on fluoxetine  . Headache(784.0)    migraine  . Hiatal hernia   . Hyperlipidemia   . Hypertension   . OA (ocular albinism) ([place])   . Osteopenia   . PUD (peptic ulcer disease)    gastric, past  . Shingles   . Stroke ([place]) 8/10   posterior reversible encephalopathy syndrome  . Urine incontinence    Past Surgical History:  Procedure Laterality Date  . carotid dopplers  8/10  . COLONOSCOPY  2002  . HERNIA REPAIR  2002   bleeding  . HIATAL HERNIA REPAIR  2000   needed blood transfusion  . Stroke  8/10   hosp stroke/ posterior (vs posterior reversible encephalopathy syndrome) - d/c on Plavix  . TEE normal  6/78   no emoblic source  . TOTAL KNEE ARTHROPLASTY  03,06   Bilateral  . VESICOVAGINAL FISTULA  CLOSURE W/ TAH  1964   with bleeding and cervical dysplasia   Social History   Tobacco Use  . Smoking status: Never Smoker  . Smokeless tobacco: Never Used  Substance Use Topics  . Alcohol use: No  . Drug use: No   Family History  Problem Relation Age of Onset  . Cerebral aneurysm Son   . Cerebral aneurysm Father   . Hyperlipidemia Father   . Alcohol abuse Father   . Stroke Mother   . Cervical cancer Sister   . Hyperlipidemia Brother   . Alcohol abuse Brother   . Stroke Brother   . Diabetes Brother   . Diabetes Sister   . Sarcoidosis Sister   . Other Sister        OP/ Broken hip  . Other Unknown        whole family has charcot marie tooth  . Ovarian cancer Sister    Allergies  Allergen Reactions  . Shrimp [Shellfish Allergy] Anaphylaxis    Anaphylaxis-swelling of throat/sob    . Amlodipine Besylate     REACTION: edema  . Atorvastatin     REACTION: pain- could not walk  . Ciprofloxacin     REACTION: unkown  . Codeine     REACTION: itchy rash  . Influenza Vaccines Other (See Comments)    Arm swelling, redness, chills, fever  . Nitrofurantoin   . Omeprazole     REACTION: not effective  . Pantoprazole Sodium     REACTION: does not work well  . Penicillins     REACTION: itchy rash  . Prednisone     REACTION: RASH.// SHORTNESS OF BREATH  . Simvastatin     REACTION: leg pain  . Sulfonamide Derivatives     REACTION: itchy rash   Current Outpatient Medications on File Prior to Visit  Medication Sig Dispense Refill  . acetaminophen (TYLENOL) 650 MG CR tablet Take 650 mg by mouth every 8 (eight) hours as needed for pain.    . Chlorpheniramine Maleate ([employer] ALLERGY RELIEF PO) Take 1 tablet by mouth at bedtime as needed.    . Cholecalciferol (VITAMIN D) 2000 UNITS CAPS Take 2 capsules by mouth daily.      . clopidogrel (PLAVIX) 75 MG tablet Take 1 tablet (75 mg total) by mouth daily. 90 tablet 3  . cyclobenzaprine (  FLEXERIL) 10 MG tablet TAKE 1/2 TO 1 TABLET BY  MOUTH 3 TIMES A DAY AS NEEDED FOR MUSCLE SPASMS(WATCH FOR SEDATION AND FALLS 270 tablet 1  . esomeprazole (NEXIUM) 40 MG capsule Take 1 capsule (40 mg total) by mouth daily before breakfast. 90 capsule 3  . Flaxseed, Linseed, (FLAX SEED OIL) 1000 MG CAPS Take 1 capsule by mouth daily.      Marland Kitchen FLUoxetine (PROZAC) 20 MG capsule Take 1 capsule (20 mg total) by mouth daily. 90 capsule 3  . furosemide (LASIX) 20 MG tablet Take 2 tablets (40 mg total) by mouth daily. 180 tablet 3  . glucose blood (FREESTYLE LITE) test strip Use to check blood sugar once daily for DM (dx. E11.618) 100 each 1  . losartan (COZAAR) 100 MG tablet TAKE 1/2 TAB BY MOUTH ONCE A DAY 45 tablet 3  . meclizine (ANTIVERT) 32 MG tablet Take 1 tablet (32 mg total) by mouth 3 (three) times daily as needed. alternatively you can take one 25 mg tablet 4 times a day. Be careful it may make you sleepy 30 tablet 0  . metFORMIN (GLUCOPHAGE) 1000 MG tablet Take 1 tablet (1,000 mg total) by mouth 2 (two) times daily with a meal. 180 tablet 3  . Misc. Devices (WALKER) MISC Wheeled walker with seat- Pt must use for ambulation-- for use as directed: ambulation dx: 729.5, 780.79, 786.05, 784.0, 715.89.     Marland Kitchen omega-3 acid ethyl esters (LOVAZA) 1 g capsule TAKE 1 CAPSULE (1 G TOTAL) BY MOUTH DAILY. 90 capsule 2  . ondansetron (ZOFRAN ODT) 4 MG disintegrating tablet Take 1 tablet (4 mg total) by mouth every 8 (eight) hours as needed for nausea or vomiting. 20 tablet 0  . PROAIR HFA 108 (90 Base) MCG/ACT inhaler INHALE 2 PUFFS INTO THE LUNGS EVERY 4 (FOUR) HOURS AS NEEDED FOR WHEEZING OR SHORTNESS OF BREATH. 8.5 g 3  . rosuvastatin (CRESTOR) 10 MG tablet Take 1 tablet (10 mg total) by mouth at bedtime. 90 tablet 1   No current facility-administered medications on file prior to visit.     Review of Systems  Constitutional: Positive for appetite change. Negative for fatigue and fever.  HENT: Positive for congestion, ear pain, postnasal drip, rhinorrhea,  sinus pressure and sore throat. Negative for nosebleeds.   Eyes: Negative for pain, redness and itching.  Respiratory: Positive for cough. Negative for shortness of breath and wheezing.   Cardiovascular: Negative for chest pain.  Gastrointestinal: Negative for abdominal pain, diarrhea, nausea and vomiting.  Endocrine: Negative for polyuria.  Genitourinary: Negative for dysuria, frequency and urgency.  Musculoskeletal: Negative for arthralgias and myalgias.  Allergic/Immunologic: Negative for immunocompromised state.  Neurological: Positive for dizziness and headaches. Negative for tremors, syncope, weakness and numbness.  Hematological: Negative for adenopathy. Does not bruise/bleed easily.  Psychiatric/Behavioral: Negative for dysphoric mood. The patient is not nervous/anxious.        Objective:   Physical Exam  Constitutional: She appears well-developed and well-nourished. No distress.  HENT:  Head: Normocephalic and atraumatic.  Right Ear: External ear normal.  Left Ear: External ear normal.  Mouth/Throat: Oropharynx is clear and moist. No oropharyngeal exudate.  Nares are injected and congested -worse on the L Bilateral maxillary sinus tenderness (markedly tender esp on L) Post nasal drip   Eyes: Pupils are equal, round, and reactive to light. Conjunctivae and EOM are normal. Right eye exhibits no discharge. Left eye exhibits no discharge.  2-3 beats of horiz nystagmus bilaterally  Neck: Normal  range of motion. Neck supple.  Cardiovascular: Normal rate and regular rhythm.  Pulmonary/Chest: Effort normal and breath sounds normal. No respiratory distress. She has no wheezes. She has no rales.  Lymphadenopathy:    She has no cervical adenopathy.  Neurological: She is alert. She displays no atrophy. No cranial nerve deficit or sensory deficit. She exhibits normal muscle tone. Coordination normal.  Skin: Skin is warm and dry. No rash noted. No pallor.  Psychiatric: She has a normal  mood and affect.          Assessment & Plan:   Problem List Items Addressed This Visit      Cardiovascular and Mediastinum   Essential hypertension    Stable  Under tx for vertigo and sinusitis -will watch         Respiratory   Acute sinusitis - Primary    Little to no imp with zpak from ED Reviewed hospital records, lab results and studies in detail   Still having vertigo and severe congestion Will try low dose course of prednisone (20 mg taper) as well as rep zpak (has other abx allergies) Safety disc in light of vertigo -change positions slowly  Update if not starting to improve in a week or if worsening  Consider ENT if no imp       Relevant Medications   Chlorpheniramine Maleate ([employer]S ALLERGY RELIEF PO)   azithromycin (ZITHROMAX Z-PAK) 250 MG tablet   predniSONE (DELTASONE) 10 MG tablet     Endocrine   Diabetes mellitus ([place])    Warned that this course of prednisone may raise blood glucose levels  She voiced understanding        Other   Vertigo    Suspect related to ETD and sinusitis  Continue meclizine/zofran prn  Repeat zpak  Also 20 mg pred taper/slow Update if not starting to improve in a week or if worsening  Consider ENT ref if needed Reassuring neuro exam       Other Visit Diagnoses    Benign paroxysmal positional vertigo due to bilateral vestibular disorder

## 2017-05-06 NOTE — Assessment & Plan Note (Signed)
Suspect related to ETD and sinusitis  Continue meclizine/zofran prn  Repeat zpak  Also 20 mg pred taper/slow Update if not starting to improve in a week or if worsening  Consider ENT ref if needed Reassuring neuro exam

## 2017-05-06 NOTE — Assessment & Plan Note (Signed)
Stable  Under tx for vertigo and sinusitis -will watch

## 2017-05-06 NOTE — Assessment & Plan Note (Signed)
Warned that this course of prednisone may raise blood glucose levels  She voiced understanding

## 2017-05-18 ENCOUNTER — Ambulatory Visit: Payer: Self-pay | Admitting: *Deleted

## 2017-05-18 NOTE — Telephone Encounter (Signed)
[place] [employer]  By  [employer]  At [employer]. Pt  Advise  To  Use  Precautions  Walking  Turning   And  Avoiding sudden  head movements. Advised to call  Back or 911  If any  Severe  Symptoms      Reason for Disposition . [1] MODERATE dizziness (e.g., vertigo; feels very unsteady, interferes with normal activities) AND [2] has been evaluated by physician for this  Answer Assessment - Initial Assessment Questions 1. DESCRIPTION: "Describe your dizziness."     When flat  Room  Spins  Beds  Seems  To  [place] -  When or  Rides  In car or stands  Things  Tilt  - gets  relief at  45  degree angle  Being still  2. VERTIGO: "Do you feel like either you or the room is spinning or tilting?"       When stands   3. LIGHTHEADED: "Do you feel lightheaded?" (e.g., somewhat faint, woozy, weak upon standing)      Yes  When standing   4. SEVERITY: "How bad is it?"  "Can you walk?"   - MILD - Feels unsteady but walking normally.   - MODERATE - Feels very unsteady when walking, but not falling; interferes with normal activities (e.g., school, work) .   - SEVERE - Unable to walk without falling (requires assistance).      Moderate  And  Severe  At times    5. ONSET:  "When did the dizziness begin?"       1  Month   6. AGGRAVATING FACTORS: "Does anything make it worse?" (e.g., standing, change in head position)       Worse  When stands  Or  Changes  posistions   7. CAUSE: "What do you think is causing the dizziness?"       Was  Treated  For  Sinus  Infections   8. RECURRENT SYMPTOM: "Have you had dizziness before?" If so, ask: "When was the last time?" "What happened that time?"       Never this  Bad   9. OTHER SYMPTOMS: "Do you have any other symptoms?" (e.g., headache, weakness, numbness, vomiting, earache)      Periods  Of  Nausea  Headache  2  Days  Ago   10. PREGNANCY: "Is there any chance you are pregnant?" "When was your last menstrual period?"       n/a  Protocols used:  DIZZINESS - VERTIGO-A-AH

## 2017-05-18 NOTE — Telephone Encounter (Signed)
Duplicate  Triage  Encounter  Please  Disregard

## 2017-05-19 ENCOUNTER — Encounter: Payer: Self-pay | Admitting: Family Medicine

## 2017-05-19 DIAGNOSIS — R42 Dizziness and giddiness: Secondary | ICD-10-CM

## 2017-05-20 ENCOUNTER — Ambulatory Visit: Payer: Medicare Other | Admitting: Family Medicine

## 2017-05-20 NOTE — Telephone Encounter (Signed)
Ref to ENT 

## 2017-05-27 DIAGNOSIS — H8110 Benign paroxysmal vertigo, unspecified ear: Secondary | ICD-10-CM | POA: Diagnosis not present

## 2017-05-27 DIAGNOSIS — E119 Type 2 diabetes mellitus without complications: Secondary | ICD-10-CM | POA: Diagnosis not present

## 2017-05-27 DIAGNOSIS — I6350 Cerebral infarction due to unspecified occlusion or stenosis of unspecified cerebral artery: Secondary | ICD-10-CM | POA: Diagnosis not present

## 2017-05-27 DIAGNOSIS — I10 Essential (primary) hypertension: Secondary | ICD-10-CM | POA: Diagnosis not present

## 2017-05-27 DIAGNOSIS — H903 Sensorineural hearing loss, bilateral: Secondary | ICD-10-CM | POA: Diagnosis not present

## 2017-05-27 DIAGNOSIS — R42 Dizziness and giddiness: Secondary | ICD-10-CM | POA: Diagnosis not present

## 2017-06-03 DIAGNOSIS — R42 Dizziness and giddiness: Secondary | ICD-10-CM | POA: Diagnosis not present

## 2017-06-24 ENCOUNTER — Encounter (INDEPENDENT_AMBULATORY_CARE_PROVIDER_SITE_OTHER): Payer: Self-pay

## 2017-07-01 ENCOUNTER — Other Ambulatory Visit: Payer: Self-pay | Admitting: *Deleted

## 2017-07-01 MED ORDER — OMEGA-3-ACID ETHYL ESTERS 1 G PO CAPS: 1.0000 g | ORAL_CAPSULE | Freq: Every day | ORAL | 2 refills | Status: DC

## 2017-07-14 ENCOUNTER — Other Ambulatory Visit: Payer: Self-pay | Admitting: *Deleted

## 2017-07-14 MED ORDER — GLUCOSE BLOOD VI STRP: ORAL_STRIP | 3 refills | Status: DC

## 2017-07-16 ENCOUNTER — Telehealth: Payer: Self-pay | Admitting: Family Medicine

## 2017-07-16 ENCOUNTER — Ambulatory Visit: Payer: Medicare Other

## 2017-07-16 DIAGNOSIS — E11618 Type 2 diabetes mellitus with other diabetic arthropathy: Secondary | ICD-10-CM

## 2017-07-16 DIAGNOSIS — E559 Vitamin D deficiency, unspecified: Secondary | ICD-10-CM

## 2017-07-16 DIAGNOSIS — E7800 Pure hypercholesterolemia, unspecified: Secondary | ICD-10-CM

## 2017-07-16 DIAGNOSIS — I10 Essential (primary) hypertension: Secondary | ICD-10-CM

## 2017-07-16 NOTE — Telephone Encounter (Signed)
-----   Message from Eustace Pen, LPN sent at 8/33/5825  3:39 PM EDT ----- Regarding: Labs 6/21 Lab orders needed. Thank you.  Insurance:  [employer]

## 2017-07-17 ENCOUNTER — Ambulatory Visit: Payer: PRIVATE HEALTH INSURANCE

## 2017-07-20 ENCOUNTER — Other Ambulatory Visit: Payer: Self-pay | Admitting: *Deleted

## 2017-07-20 ENCOUNTER — Ambulatory Visit (INDEPENDENT_AMBULATORY_CARE_PROVIDER_SITE_OTHER): Payer: Medicare Other

## 2017-07-20 VITALS — BP 122/80 | HR 68 | Temp 98.4°F | Ht <= 58 in | Wt 168.0 lb

## 2017-07-20 DIAGNOSIS — Z0000 Encounter for general adult medical examination without abnormal findings: Secondary | ICD-10-CM

## 2017-07-20 DIAGNOSIS — I10 Essential (primary) hypertension: Secondary | ICD-10-CM | POA: Diagnosis not present

## 2017-07-20 DIAGNOSIS — E559 Vitamin D deficiency, unspecified: Secondary | ICD-10-CM | POA: Diagnosis not present

## 2017-07-20 DIAGNOSIS — E11618 Type 2 diabetes mellitus with other diabetic arthropathy: Secondary | ICD-10-CM | POA: Diagnosis not present

## 2017-07-20 DIAGNOSIS — E7800 Pure hypercholesterolemia, unspecified: Secondary | ICD-10-CM

## 2017-07-20 LAB — COMPREHENSIVE METABOLIC PANEL
ALK PHOS: 55 U/L (ref 39–117)
ALT: 11 U/L (ref 0–35)
AST: 11 U/L (ref 0–37)
Albumin: 4 g/dL (ref 3.5–5.2)
BILIRUBIN TOTAL: 0.4 mg/dL (ref 0.2–1.2)
BUN: 23 mg/dL (ref 6–23)
CO2: 30 mEq/L (ref 19–32)
Calcium: 9.9 mg/dL (ref 8.4–10.5)
Chloride: 104 mEq/L (ref 96–112)
Creatinine, Ser: 0.69 mg/dL (ref 0.40–1.20)
GFR: 85.96 mL/min (ref >60.00)
Glucose, Bld: 133 mg/dL — ABNORMAL HIGH (ref 70–99)
POTASSIUM: 4.6 meq/L (ref 3.5–5.1)
SODIUM: 141 meq/L (ref 135–145)
TOTAL PROTEIN: 6.6 g/dL (ref 6.0–8.3)

## 2017-07-20 LAB — LIPID PANEL
Cholesterol: 194 mg/dL (ref 0–200)
HDL: 59.6 mg/dL (ref >39.00)
LDL Cholesterol: 107 mg/dL — ABNORMAL HIGH (ref 0–99)
NonHDL: 134.68
Total CHOL/HDL Ratio: 3
Triglycerides: 137 mg/dL (ref 0.0–149.0)
VLDL: 27.4 mg/dL (ref 0.0–40.0)

## 2017-07-20 LAB — CBC WITH DIFFERENTIAL/PLATELET
BASOS ABS: 0 10*3/uL (ref 0.0–0.1)
Basophils Relative: 0.6 % (ref 0.0–3.0)
EOS ABS: 0.1 10*3/uL (ref 0.0–0.7)
Eosinophils Relative: 1.4 % (ref 0.0–5.0)
HCT: 38.9 % (ref 36.0–46.0)
HEMOGLOBIN: 13.1 g/dL (ref 12.0–15.0)
Lymphocytes Relative: 33.5 % (ref 12.0–46.0)
Lymphs Abs: 1.7 10*3/uL (ref 0.7–4.0)
MCHC: 33.6 g/dL (ref 30.0–36.0)
MCV: 90.9 fl (ref 78.0–100.0)
Monocytes Absolute: 0.3 10*3/uL (ref 0.1–1.0)
Monocytes Relative: 6.4 % (ref 3.0–12.0)
NEUTROS PCT: 58.1 % (ref 43.0–77.0)
Neutro Abs: 2.9 10*3/uL (ref 1.4–7.7)
Platelets: 156 10*3/uL (ref 150.0–400.0)
RBC: 4.28 Mil/uL (ref 3.87–5.11)
RDW: 13.1 % (ref 11.5–15.5)
WBC: 4.9 10*3/uL (ref 4.0–10.5)

## 2017-07-20 LAB — HEMOGLOBIN A1C: HEMOGLOBIN A1C: 7.8 % — AB (ref 4.6–6.5)

## 2017-07-20 LAB — TSH: TSH: 1.54 u[IU]/mL (ref 0.35–4.50)

## 2017-07-20 LAB — VITAMIN D 25 HYDROXY (VIT D DEFICIENCY, FRACTURES): VITD: 72.62 ng/mL (ref 30.00–100.00)

## 2017-07-20 MED ORDER — METFORMIN HCL 1000 MG PO TABS: 1000.0000 mg | ORAL_TABLET | Freq: Two times a day (BID) | ORAL | 1 refills | Status: DC

## 2017-07-20 MED ORDER — FLUOXETINE HCL 20 MG PO CAPS: 20.0000 mg | ORAL_CAPSULE | Freq: Every day | ORAL | 1 refills | Status: DC

## 2017-07-20 NOTE — Patient Instructions (Signed)
Lydia King , Thank you for taking time to come for your Medicare Wellness Visit. I appreciate your ongoing commitment to your health goals. Please review the following plan we discussed and let me know if I can assist you in the future.   These are the goals we discussed: Goals    . Increase physical activity     Starting 07/20/2017, I will continue to exercise for 15 minutes 5 days per week.        This is a list of the screening recommended for you and due dates:  Health Maintenance  Topic Date Due  . Complete foot exam   07/29/2017*  . Tetanus Vaccine  07/21/2018*  . DEXA scan (bone density measurement)  07/14/2048*  . Hemoglobin A1C  01/19/2018  . Eye exam for diabetics  04/01/2018  . Pneumonia vaccines  Completed  *Topic was postponed. The date shown is not the original due date.   Preventive Care for Adults  A healthy lifestyle and preventive care can promote health and wellness. Preventive health guidelines for adults include the following key practices.  . A routine yearly physical is a good way to check with your health care provider about your health and preventive screening. It is a chance to share any concerns and updates on your health and to receive a thorough exam.  . Visit your dentist for a routine exam and preventive care every 6 months. Brush your teeth twice a day and floss once a day. Good oral hygiene prevents tooth decay and gum disease.  . The frequency of eye exams is based on your age, health, family medical history, use  of contact lenses, and other factors. Follow your health care provider's recommendations for frequency of eye exams.  . Eat a healthy diet. Foods like vegetables, fruits, whole grains, low-fat dairy products, and lean protein foods contain the nutrients you need without too many calories. Decrease your intake of foods high in solid fats, added sugars, and salt. Eat the right amount of calories for you. Get information about a proper diet  from your health care provider, if necessary.  . Regular physical exercise is one of the most important things you can do for your health. Most adults should get at least 150 minutes of moderate-intensity exercise (any activity that increases your heart rate and causes you to sweat) each week. In addition, most adults need muscle-strengthening exercises on 2 or more days a week.  Silver Sneakers may be a benefit available to you. To determine eligibility, you may visit the website: www.silversneakers.com or contact program at 857 259 3206 Mon-Fri between 8AM-8PM.   . Maintain a healthy weight. The body mass index (BMI) is a screening tool to identify possible weight problems. It provides an estimate of body fat based on height and weight. Your health care provider can find your BMI and can help you achieve or maintain a healthy weight.   For adults 20 years and older: ? A BMI below 18.5 is considered underweight. ? A BMI of 18.5 to 24.9 is normal. ? A BMI of 25 to 29.9 is considered overweight. ? A BMI of 30 and above is considered obese.   . Maintain normal blood lipids and cholesterol levels by exercising and minimizing your intake of saturated fat. Eat a balanced diet with plenty of fruit and vegetables. Blood tests for lipids and cholesterol should begin at age 23 and be repeated every 5 years. If your lipid or cholesterol levels are high, you are over  50, or you are at high risk for heart disease, you may need your cholesterol levels checked more frequently. Ongoing high lipid and cholesterol levels should be treated with medicines if diet and exercise are not working.  . If you smoke, find out from your health care provider how to quit. If you do not use tobacco, please do not start.  . If you choose to drink alcohol, please do not consume more than 2 drinks per day. One drink is considered to be 12 ounces (355 mL) of beer, 5 ounces (148 mL) of wine, or 1.5 ounces (44 mL) of liquor.  .  If you are 3-20 years old, ask your health care provider if you should take aspirin to prevent strokes.  . Use sunscreen. Apply sunscreen liberally and repeatedly throughout the day. You should seek shade when your shadow is shorter than you. Protect yourself by wearing long sleeves, pants, a wide-brimmed hat, and sunglasses year round, whenever you are outdoors.  . Once a month, do a whole body skin exam, using a mirror to look at the skin on your back. Tell your health care provider of new moles, moles that have irregular borders, moles that are larger than a pencil eraser, or moles that have changed in shape or color.

## 2017-07-22 ENCOUNTER — Ambulatory Visit: Payer: PRIVATE HEALTH INSURANCE | Admitting: Family Medicine

## 2017-07-22 NOTE — Progress Notes (Signed)
Medical screening examination/treatment/procedure(s) were performed by registered nurse and as supervising non-physician practitioner, I was immediately available for consultation/collaboration.   .mecred

## 2017-07-22 NOTE — Progress Notes (Signed)
PCP notes:   Health maintenance:  Foot exam - PCP please address at next appt Tetanus vaccine - postponed/insurance A1C - completed  Abnormal screenings:   Fall risk - hx of multiple falls Fall Risk  07/20/2017 08/18/2016 07/14/2016 04/16/2015 10/04/2013  Falls in the past year? Yes Yes No Yes Yes  Comment multiple falls (6) due to losing balance and vertigo; consulted with ENT; no falls since treatment Emmi Telephone Survey: data to providers prior to load - lost balance while trying to pick up cane -  Number falls in past yr: 2 or more 2 or more - 1 1  Comment - Emmi Telephone Survey Actual Response = 2 - - -  Injury with Fall? Yes No - No No  Risk Factor Category  High Fall Risk - - - -  Risk for fall due to : Impaired balance/gait - - Impaired balance/gait;Impaired mobility;Impaired vision;History of fall(s) -  Risk for fall due to: Comment - - - - -  Follow up - - - Falls evaluation completed -    Patient concerns:   None  Nurse concerns:  None  Next PCP appt:   07/29/17 @ 1630

## 2017-07-22 NOTE — Progress Notes (Signed)
Subjective:   Lydia King is a 84 y.o. female who presents for Medicare Annual (Subsequent) preventive examination.  Review of Systems:  N/A Cardiac Risk Factors include: advanced age (>37men, >3 women);obesity (BMI >30kg/m2);hypertension;diabetes mellitus;dyslipidemia     Objective:     Vitals: BP 122/80 (BP Location: Right Arm, Patient Position: Sitting, Cuff Size: Normal)   Pulse 68   Temp 98.4 F (36.9 C) (Oral)   Ht 4\' 10"  (1.473 m) Comment: shoes  Wt 168 lb (76.2 kg)   SpO2 95%   BMI 35.11 kg/m   Body mass index is 35.11 kg/m.  Advanced Directives 07/20/2017 02/22/2017 12/28/2016 10/20/2016 07/16/2016 07/14/2016 01/07/2016  Does Patient Have a Medical Advance Directive? Yes Yes Yes No Yes Yes Yes  Type of Paramedic of [place];Living will [place];Living will Out of facility DNR (pink MOST or yellow form);Living will;Healthcare Power of [place] Rack - Living will [place];Living will [place];Living will  Does patient want to make changes to medical advance directive? - - - - - - No - Patient declined  Copy of [place] in Chart? Yes No - copy requested No - copy requested - - No - copy requested No - copy requested  Would patient like information on creating a medical advance directive? - - No - Patient declined - - - -    Tobacco Social History   Tobacco Use  Smoking Status Never Smoker  Smokeless Tobacco Never Used     Counseling given: No   Clinical Intake:  Pre-visit preparation completed: Yes  Pain Score: 6      Nutritional Status: BMI > 30  Obese Nutritional Risks: None Diabetes: Yes CBG done?: No Did pt. bring in CBG monitor from home?: No  How often do you need to have someone help you when you read instructions, pamphlets, or other written materials from your doctor or pharmacy?: 1 - Never What is the last grade level you completed in school?:  GED  Interpreter Needed?: No  Comments: pt is a widower Information entered by :: LPinson, LPN  Past Medical History:  Diagnosis Date  . Anemia    in past (from blood loss at hiatal hernia)  . Asthma   . Charcot-Marie-Tooth disease    walks with cane (fairly controlled)  . Diverticulosis   . DVT (deep venous thrombosis) ([place]) 2003   after sx  . GERD (gastroesophageal reflux disease)   . Grief reaction    on fluoxetine  . Headache(784.0)    migraine  . Hiatal hernia   . Hyperlipidemia   . Hypertension   . OA (ocular albinism) ([place])   . Osteopenia   . PUD (peptic ulcer disease)    gastric, past  . Shingles   . Stroke ([place]) 8/10   posterior reversible encephalopathy syndrome  . Urine incontinence    Past Surgical History:  Procedure Laterality Date  . carotid dopplers  8/10  . COLONOSCOPY  2002  . HERNIA REPAIR  2002   bleeding  . HIATAL HERNIA REPAIR  2000   needed blood transfusion  . Stroke  8/10   hosp stroke/ posterior (vs posterior reversible encephalopathy syndrome) - d/c on Plavix  . TEE normal  7/03   no emoblic source  . TOTAL KNEE ARTHROPLASTY  03,06   Bilateral  . VESICOVAGINAL FISTULA CLOSURE W/ TAH  1964   with bleeding and cervical dysplasia   Family History  Problem Relation  Age of Onset  . Cerebral aneurysm Son   . Cerebral aneurysm Father   . Hyperlipidemia Father   . Alcohol abuse Father   . Stroke Mother   . Cervical cancer Sister   . Hyperlipidemia Brother   . Alcohol abuse Brother   . Stroke Brother   . Diabetes Brother   . Diabetes Sister   . Sarcoidosis Sister   . Other Sister        OP/ Broken hip  . Other Unknown        whole family has charcot marie tooth  . Ovarian cancer Sister    Social History   Socioeconomic History  . Marital status: Widowed    Spouse name: Not on file  . Number of children: Not on file  . Years of education: Not on file  . Highest education level: Not on file  Occupational History  . Not on  file  Social Needs  . Financial resource strain: Not on file  . Food insecurity:    Worry: Not on file    Inability: Not on file  . Transportation needs:    Medical: Not on file    Non-medical: Not on file  Tobacco Use  . Smoking status: Never Smoker  . Smokeless tobacco: Never Used  Substance and Sexual Activity  . Alcohol use: No  . Drug use: No  . Sexual activity: Never  Lifestyle  . Physical activity:    Days per week: Not on file    Minutes per session: Not on file  . Stress: Not on file  Relationships  . Social connections:    Talks on phone: Not on file    Gets together: Not on file    Attends religious service: Not on file    Active member of club or organization: Not on file    Attends meetings of clubs or organizations: Not on file    Relationship status: Not on file  Other Topics Concern  . Not on file  Social History Narrative   [id number]   Son died 2 years ago cerebral anerysm    Outpatient Encounter Medications as of 07/20/2017  Medication Sig  . acetaminophen (TYLENOL) 650 MG CR tablet Take 650 mg by mouth every 8 (eight) hours as needed for pain.  . Chlorpheniramine Maleate ([employer] ALLERGY RELIEF PO) Take 1 tablet by mouth at bedtime as needed.  . Cholecalciferol (VITAMIN D) 2000 UNITS CAPS Take 2 capsules by mouth daily.    . clopidogrel (PLAVIX) 75 MG tablet Take 1 tablet (75 mg total) by mouth daily.  Marland Kitchen esomeprazole (NEXIUM) 40 MG capsule Take 1 capsule (40 mg total) by mouth daily before breakfast.  . Flaxseed, Linseed, (FLAX SEED OIL) 1000 MG CAPS Take 1 capsule by mouth daily.    Marland Kitchen FLUoxetine (PROZAC) 20 MG capsule Take 1 capsule (20 mg total) by mouth daily.  . furosemide (LASIX) 20 MG tablet Take 2 tablets (40 mg total) by mouth daily.  Marland Kitchen glucose blood (FREESTYLE LITE) test strip Use to check blood sugar once daily for DM (dx. E11.618)  . losartan (COZAAR) 100 MG tablet TAKE 1/2 TAB BY MOUTH ONCE A DAY  . metFORMIN (GLUCOPHAGE) 1000 MG tablet Take 1  tablet (1,000 mg total) by mouth 2 (two) times daily with a meal.  . Misc. Devices (WALKER) MISC Wheeled walker with seat- Pt must use for ambulation-- for use as directed: ambulation dx: 729.5, 780.79, 786.05, 784.0, 715.89.   Marland Kitchen omega-3 acid ethyl esters (  LOVAZA) 1 g capsule Take 1 capsule (1 g total) by mouth daily.  Marland Kitchen PROAIR HFA 108 (90 Base) MCG/ACT inhaler INHALE 2 PUFFS INTO THE LUNGS EVERY 4 (FOUR) HOURS AS NEEDED FOR WHEEZING OR SHORTNESS OF BREATH.  . rosuvastatin (CRESTOR) 10 MG tablet Take 1 tablet (10 mg total) by mouth at bedtime.  . [DISCONTINUED] cyclobenzaprine (FLEXERIL) 10 MG tablet TAKE 1/2 TO 1 TABLET BY MOUTH 3 TIMES A DAY AS NEEDED FOR MUSCLE SPASMS(WATCH FOR SEDATION AND FALLS  . [DISCONTINUED] FLUoxetine (PROZAC) 20 MG capsule Take 1 capsule (20 mg total) by mouth daily.  . [DISCONTINUED] meclizine (ANTIVERT) 32 MG tablet Take 1 tablet (32 mg total) by mouth 3 (three) times daily as needed. alternatively you can take one 25 mg tablet 4 times a day. Be careful it may make you sleepy  . [DISCONTINUED] metFORMIN (GLUCOPHAGE) 1000 MG tablet Take 1 tablet (1,000 mg total) by mouth 2 (two) times daily with a meal.  . [DISCONTINUED] azithromycin (ZITHROMAX Z-PAK) 250 MG tablet Take 2 pills by mouth today and then 1 pill daily for 4 days  . [DISCONTINUED] ondansetron (ZOFRAN ODT) 4 MG disintegrating tablet Take 1 tablet (4 mg total) by mouth every 8 (eight) hours as needed for nausea or vomiting.  . [DISCONTINUED] predniSONE (DELTASONE) 10 MG tablet Take 2 pills once daily for seven days and then one pill once daily for 5 days   No facility-administered encounter medications on file as of 07/20/2017.     Activities of Daily Living In your present state of health, do you have any difficulty performing the following activities: 07/20/2017  Hearing? Y  Vision? Y  Difficulty concentrating or making decisions? N  Walking or climbing stairs? Y  Dressing or bathing? Y  Doing errands,  shopping? Y  Preparing Food and eating ? Y  Using the Toilet? N  In the past six months, have you accidently leaked urine? Y  Do you have problems with loss of bowel control? N  Managing your Medications? Y  Managing your Finances? Y  Housekeeping or managing your Housekeeping? Y  Some recent data might be hidden    Patient Care Team: Tower, Wynelle Fanny, MD as PCP - General    Assessment:   This is a routine wellness examination for Dorlisa.   Hearing Screening   125Hz  250Hz  500Hz  1000Hz  2000Hz  3000Hz  4000Hz  6000Hz  8000Hz   Right ear:   40 40 40  40    Left ear:   40 40 40  40    Vision Screening Comments: March 2019 with Dr. Marvel Plan   Exercise Activities and Dietary recommendations Current Exercise Habits: Home exercise routine, Type of exercise: stretching, Time (Minutes): 15, Frequency (Times/Week): 7, Weekly Exercise (Minutes/Week): 105, Intensity: Mild  Goals    . Increase physical activity     Starting 07/20/2017, I will continue to exercise for 15 minutes 5 days per week.        Fall Risk Fall Risk  07/20/2017 08/18/2016 07/14/2016 04/16/2015 10/04/2013  Falls in the past year? Yes Yes No Yes Yes  Comment multiple falls (6) due to losing balance and vertigo; consulted with ENT; no falls since treatment Emmi Telephone Survey: data to providers prior to load - lost balance while trying to pick up cane -  Number falls in past yr: 2 or more 2 or more - 1 1  Comment - Emmi Telephone Survey Actual Response = 2 - - -  Injury with Fall? Yes No - No No  Risk Factor Category  High Fall Risk - - - -  Risk for fall due to : Impaired balance/gait - - Impaired balance/gait;Impaired mobility;Impaired vision;History of fall(s) -  Risk for fall due to: Comment - - - - -  Follow up - - - Falls evaluation completed -   Depression Screen PHQ 2/9 Scores 07/20/2017 07/14/2016 04/16/2015 10/04/2013  PHQ - 2 Score 0 0 0 0  PHQ- 9 Score 0 - - -     Cognitive Function MMSE - Mini Mental State Exam  07/20/2017 07/14/2016 04/16/2015  Orientation to time 5 5 5   Orientation to Place 5 5 5   Registration 3 3 3   Attention/ Calculation 0 0 3  Recall 3 3 3   Language- name 2 objects 0 0 0  Language- repeat 1 1 1   Language- follow 3 step command 3 3 3   Language- read & follow direction 0 0 0  Write a sentence 0 0 0  Copy design 0 0 0  Total score 20 20 23      PLEASE NOTE: A Mini-Cog screen was completed. Maximum score is 20. A value of 0 denotes this part of [place] MMSE was not completed or the patient failed this part of the Mini-Cog screening.   Mini-Cog Screening Orientation to Time - Max 5 pts Orientation to Place - Max 5 pts Registration - Max 3 pts Recall - Max 3 pts Language Repeat - Max 1 pts Language Follow 3 Step Command - Max 3 pts   Immunization History  Administered Date(s) Administered  . Influenza Split 11/22/2010, 11/18/2011  . Influenza-Unspecified 10/27/2012, 10/27/2013  . Pneumococcal Conjugate-13 10/04/2013  . Pneumococcal Polysaccharide-23 01/27/2005  . Td 05/26/2007    Screening Tests Health Maintenance  Topic Date Due  . FOOT EXAM  07/29/2017 (Originally 07/18/2017)  . TETANUS/TDAP  07/21/2018 (Originally 05/25/2017)  . DEXA SCAN  07/14/2048 (Originally 08/19/1997)  . HEMOGLOBIN A1C  01/19/2018  . OPHTHALMOLOGY EXAM  04/01/2018  . PNA vac Low Risk Adult  Completed     Plan:     I have personally reviewed, addressed, and noted the following in the patient's chart:  A. Medical and social history B. Use of alcohol, tobacco or illicit drugs  C. Current medications and supplements D. Functional ability and status E.  Nutritional status F.  Physical activity G. Advance directives H. List of other physicians I.  Hospitalizations, surgeries, and ER visits in previous 12 months J.  [facility] to include hearing, vision, cognitive, depression L. Referrals and appointments - none  In addition, I have reviewed and discussed with patient certain  preventive protocols, quality metrics, and best practice recommendations. A written personalized care plan for preventive services as well as general preventive health recommendations were provided to patient.  See attached scanned questionnaire for additional information.   Signed,   Lindell Noe, MHA, BS, LPN Health Coach

## 2017-07-29 ENCOUNTER — Encounter: Payer: Self-pay | Admitting: Family Medicine

## 2017-07-29 ENCOUNTER — Ambulatory Visit (INDEPENDENT_AMBULATORY_CARE_PROVIDER_SITE_OTHER): Payer: Medicare Other | Admitting: Family Medicine

## 2017-07-29 VITALS — BP 124/70 | HR 70 | Temp 97.7°F | Ht <= 58 in | Wt 169.8 lb

## 2017-07-29 DIAGNOSIS — I10 Essential (primary) hypertension: Secondary | ICD-10-CM | POA: Diagnosis not present

## 2017-07-29 DIAGNOSIS — E11618 Type 2 diabetes mellitus with other diabetic arthropathy: Secondary | ICD-10-CM | POA: Diagnosis not present

## 2017-07-29 DIAGNOSIS — M8580 Other specified disorders of bone density and structure, unspecified site: Secondary | ICD-10-CM

## 2017-07-29 DIAGNOSIS — E559 Vitamin D deficiency, unspecified: Secondary | ICD-10-CM | POA: Diagnosis not present

## 2017-07-29 DIAGNOSIS — E7800 Pure hypercholesterolemia, unspecified: Secondary | ICD-10-CM

## 2017-07-29 MED ORDER — LOSARTAN POTASSIUM 100 MG PO TABS: ORAL_TABLET | ORAL | 3 refills | Status: DC

## 2017-07-29 MED ORDER — OMEGA-3-ACID ETHYL ESTERS 1 G PO CAPS: 1.0000 g | ORAL_CAPSULE | Freq: Every day | ORAL | 3 refills | Status: DC

## 2017-07-29 MED ORDER — ESOMEPRAZOLE MAGNESIUM 40 MG PO CPDR: 40.0000 mg | DELAYED_RELEASE_CAPSULE | Freq: Every day | ORAL | 3 refills | Status: DC

## 2017-07-29 MED ORDER — METFORMIN HCL 1000 MG PO TABS: 1000.0000 mg | ORAL_TABLET | Freq: Two times a day (BID) | ORAL | 3 refills | Status: DC

## 2017-07-29 MED ORDER — ROSUVASTATIN CALCIUM 10 MG PO TABS: 10.0000 mg | ORAL_TABLET | Freq: Every day | ORAL | 3 refills | Status: DC

## 2017-07-29 MED ORDER — FLUOXETINE HCL 20 MG PO CAPS: 20.0000 mg | ORAL_CAPSULE | Freq: Every day | ORAL | 3 refills | Status: DC

## 2017-07-29 MED ORDER — CLOPIDOGREL BISULFATE 75 MG PO TABS: 75.0000 mg | ORAL_TABLET | Freq: Every day | ORAL | 3 refills | Status: DC

## 2017-07-29 MED ORDER — LORATADINE 10 MG PO TABS: 10.0000 mg | ORAL_TABLET | Freq: Every day | ORAL | 3 refills | Status: DC

## 2017-07-29 MED ORDER — FUROSEMIDE 20 MG PO TABS: 40.0000 mg | ORAL_TABLET | Freq: Every day | ORAL | 3 refills | Status: DC

## 2017-07-29 NOTE — Progress Notes (Signed)
Subjective:    Patient ID: Lydia King, female    DOB: 1930/10/10, 84 y.o.   MRN: [id number]  HPI  Here for annual f/u of chronic health problems   Wt Readings from Last 3 Encounters:  07/29/17 169 lb 12 oz (77 kg)  07/20/17 168 lb (76.2 kg)  05/06/17 174 lb 8 oz (79.2 kg)  lost some wt this year  She is trying to eat healthy - more salads  Cannot get a lot of exercise- does chair exercise 5 d per week  35.48 kg/m   amw on 6/24 Noted multiple falls -none since tx for vertigo by ENT  Doing much better   dexa -pt declines  Falls but no fractures  D level good at 72   Breast cancer screening -declines mammograms due to age (or other screening)  Self breast exam -no lumps or changes/ does not want a check   Declines colon cancer screening   Zoster status --has had shingles  May be interested in shingrix   bp is stable today  No cp or palpitations or headaches or edema  No side effects to medicines  BP Readings from Last 3 Encounters:  07/29/17 124/70  07/20/17 122/80  05/06/17 122/70      DM2 Lab Results  Component Value Date   HGBA1C 7.8 (H) 07/20/2017   this is up from 7.5  Doing best to stick with diabetic diet  Also doing chair exercise  No hypoglycemia  Stays away from sweets  Wants to drink more water   Hyperlipidemia Lab Results  Component Value Date   CHOL 194 07/20/2017   CHOL 169 07/14/2016   CHOL 161 04/16/2015   Lab Results  Component Value Date   HDL 59.60 07/20/2017   HDL 53.20 07/14/2016   HDL 58.30 04/16/2015   Lab Results  Component Value Date   LDLCALC 107 (H) 07/20/2017   LDLCALC 80 04/16/2015   LDLCALC 108 (H) 09/05/2013   Lab Results  Component Value Date   TRIG 137.0 07/20/2017   TRIG 222.0 (H) 07/14/2016   TRIG 118.0 04/16/2015   Lab Results  Component Value Date   CHOLHDL 3 07/20/2017   CHOLHDL 3 07/14/2016   CHOLHDL 3 04/16/2015   Lab Results  Component Value Date   LDLDIRECT 78.0 07/14/2016   LDLDIRECT 237.4 07/14/2008   LDLDIRECT 237.6 11/01/2007   Takes small dose of crestor  Has not missed any doses  Does not eat a lot of fat  Does occ fries and occ fried fish   Lab Results  Component Value Date   CREATININE 0.69 07/20/2017   BUN 23 07/20/2017   NA 141 07/20/2017   K 4.6 07/20/2017   CL 104 07/20/2017   CO2 30 07/20/2017   Lab Results  Component Value Date   WBC 4.9 07/20/2017   HGB 13.1 07/20/2017   HCT 38.9 07/20/2017   MCV 90.9 07/20/2017   PLT 156.0 07/20/2017   Lab Results  Component Value Date   TSH 1.54 07/20/2017    Lab Results  Component Value Date   ALT 11 07/20/2017   AST 11 07/20/2017   ALKPHOS 55 07/20/2017   BILITOT 0.4 07/20/2017     Patient Active Problem List   Diagnosis Date Noted  . Vertigo 05/06/2017  . OSA (obstructive sleep apnea) 01/02/2016  . Pedal edema 01/02/2016  . Hyponatremia 01/02/2016  . Hyperkalemia 01/02/2016  . Fall in home 12/11/2015  . Poor balance 12/11/2015  . Low  back pain with sciatica 10/02/2015  . Right hip pain 10/02/2015  . Hearing loss 04/18/2015  . Encounter for Medicare annual wellness exam 10/04/2013  . Diabetes mellitus ([place]) 08/31/2012  . CVA 09/12/2008  . OSTEOARTHRITIS, MULTIPLE JOINTS 05/30/2008  . Osteopenia 05/30/2008  . Vitamin D deficiency 07/14/2007  . SLEEP APNEA 07/14/2007  . Hyperlipidemia 05/26/2007  . Essential hypertension 11/30/2006  . Asthma 11/30/2006  . H/O gastroesophageal reflux (GERD) 11/30/2006  . G I BLEED 11/30/2006  . DEEP VENOUS THROMBOPHLEBITIS, HX OF 11/30/2006   Past Medical History:  Diagnosis Date  . Anemia    in past (from blood loss at hiatal hernia)  . Asthma   . Charcot-Marie-Tooth disease    walks with cane (fairly controlled)  . Diverticulosis   . DVT (deep venous thrombosis) ([place]) 2003   after sx  . GERD (gastroesophageal reflux disease)   . Grief reaction    on fluoxetine  . Headache(784.0)    migraine  . Hiatal hernia   .  Hyperlipidemia   . Hypertension   . OA (ocular albinism) ([place])   . Osteopenia   . PUD (peptic ulcer disease)    gastric, past  . Shingles   . Stroke ([place]) 8/10   posterior reversible encephalopathy syndrome  . Urine incontinence    Past Surgical History:  Procedure Laterality Date  . carotid dopplers  8/10  . COLONOSCOPY  2002  . HERNIA REPAIR  2002   bleeding  . HIATAL HERNIA REPAIR  2000   needed blood transfusion  . Stroke  8/10   hosp stroke/ posterior (vs posterior reversible encephalopathy syndrome) - d/c on Plavix  . TEE normal  6/44   no emoblic source  . TOTAL KNEE ARTHROPLASTY  03,06   Bilateral  . VESICOVAGINAL FISTULA CLOSURE W/ TAH  1964   with bleeding and cervical dysplasia   Social History   Tobacco Use  . Smoking status: Never Smoker  . Smokeless tobacco: Never Used  Substance Use Topics  . Alcohol use: No  . Drug use: No   Family History  Problem Relation Age of Onset  . Cerebral aneurysm Son   . Cerebral aneurysm Father   . Hyperlipidemia Father   . Alcohol abuse Father   . Stroke Mother   . Cervical cancer Sister   . Hyperlipidemia Brother   . Alcohol abuse Brother   . Stroke Brother   . Diabetes Brother   . Diabetes Sister   . Sarcoidosis Sister   . Other Sister        OP/ Broken hip  . Other Unknown        whole family has charcot marie tooth  . Ovarian cancer Sister    Allergies  Allergen Reactions  . Shrimp [Shellfish Allergy] Anaphylaxis    Anaphylaxis-swelling of throat/sob    . Amlodipine Besylate     REACTION: edema  . Atorvastatin     REACTION: pain- could not walk  . Ciprofloxacin     REACTION: unkown  . Codeine     REACTION: itchy rash  . Influenza Vaccines Other (See Comments)    Arm swelling, redness, chills, fever  . Nitrofurantoin   . Omeprazole     REACTION: not effective  . Pantoprazole Sodium     REACTION: does not work well  . Penicillins     REACTION: itchy rash  . Prednisone     REACTION: RASH.//  SHORTNESS OF BREATH  . Simvastatin     REACTION:  leg pain  . Sulfonamide Derivatives     REACTION: itchy rash   Current Outpatient Medications on File Prior to Visit  Medication Sig Dispense Refill  . acetaminophen (TYLENOL) 650 MG CR tablet Take 650 mg by mouth every 8 (eight) hours as needed for pain.    . Chlorpheniramine Maleate ([employer] ALLERGY RELIEF PO) Take 1 tablet by mouth at bedtime as needed.    . Cholecalciferol (VITAMIN D) 2000 UNITS CAPS Take 2 capsules by mouth daily.      . Flaxseed, Linseed, (FLAX SEED OIL) 1000 MG CAPS Take 1 capsule by mouth daily.      Marland Kitchen glucose blood (FREESTYLE LITE) test strip Use to check blood sugar once daily for DM (dx. E11.618) 100 each 3  . Misc. Devices (WALKER) MISC Wheeled walker with seat- Pt must use for ambulation-- for use as directed: ambulation dx: 729.5, 780.79, 786.05, 784.0, 715.89.     Marland Kitchen PROAIR HFA 108 (90 Base) MCG/ACT inhaler INHALE 2 PUFFS INTO THE LUNGS EVERY 4 (FOUR) HOURS AS NEEDED FOR WHEEZING OR SHORTNESS OF BREATH. 8.5 g 3   No current facility-administered medications on file prior to visit.     Review of Systems  Constitutional: Negative for activity change, appetite change, fatigue, fever and unexpected weight change.  HENT: Negative for congestion, ear pain, rhinorrhea, sinus pressure and sore throat.   Eyes: Negative for pain, redness and visual disturbance.  Respiratory: Negative for cough, shortness of breath and wheezing.   Cardiovascular: Negative for chest pain and palpitations.  Gastrointestinal: Negative for abdominal pain, blood in stool, constipation and diarrhea.  Endocrine: Negative for polydipsia and polyuria.  Genitourinary: Negative for dysuria, frequency and urgency.  Musculoskeletal: Positive for arthralgias, back pain and gait problem. Negative for myalgias.  Skin: Negative for pallor and rash.  Allergic/Immunologic: Negative for environmental allergies.  Neurological: Negative for dizziness, syncope  and headaches.  Hematological: Negative for adenopathy. Does not bruise/bleed easily.  Psychiatric/Behavioral: Negative for decreased concentration and dysphoric mood. The patient is not nervous/anxious.        Objective:   Physical Exam  Constitutional: She appears well-developed and well-nourished. No distress.  overwt and well appearing  Examined in chair due to mobility issues   HENT:  Head: Normocephalic and atraumatic.  Right Ear: External ear normal.  Left Ear: External ear normal.  Nose: Nose normal.  Mouth/Throat: Oropharynx is clear and moist.  Eyes: Pupils are equal, round, and reactive to light. Conjunctivae and EOM are normal. Right eye exhibits no discharge. Left eye exhibits no discharge. No scleral icterus.  Neck: Normal range of motion. Neck supple. No JVD present. Carotid bruit is not present. No thyromegaly present.  Cardiovascular: Normal rate, regular rhythm, normal heart sounds and intact distal pulses. Exam reveals no gallop.  Pulmonary/Chest: Effort normal and breath sounds normal. No respiratory distress. She has no wheezes. She has no rales.  Abdominal: Soft. Bowel sounds are normal. She exhibits no distension and no mass. There is no tenderness.  Musculoskeletal: She exhibits no edema or tenderness.  Poor rom of LS and LEs  Lymphadenopathy:    She has no cervical adenopathy.  Neurological: She is alert. She has normal reflexes. She displays normal reflexes. No cranial nerve deficit. She exhibits normal muscle tone. Coordination normal.  Unable to walk unassisted   Skin: Skin is warm and dry. No rash noted. No erythema. No pallor.  Some lentigines and sks  Psychiatric: She has a normal mood and affect.  Pleasant  Assessment & Plan:   Problem List Items Addressed This Visit      Cardiovascular and Mediastinum   Essential hypertension    bp in fair control at this time  BP Readings from Last 1 Encounters:  07/29/17 124/70   No changes  needed Most recent labs reviewed  Disc lifstyle change with low sodium diet and exercise        Relevant Medications   furosemide (LASIX) 20 MG tablet   losartan (COZAAR) 100 MG tablet   omega-3 acid ethyl esters (LOVAZA) 1 g capsule   rosuvastatin (CRESTOR) 10 MG tablet     Endocrine   Diabetes mellitus ([place]) - Primary    Lab Results  Component Value Date   HGBA1C 7.8 (H) 07/20/2017   This is up  Pt declines more medication  Will work harder on DM diet  Disc foot/eye care F/u 6 mo      Relevant Medications   losartan (COZAAR) 100 MG tablet   metFORMIN (GLUCOPHAGE) 1000 MG tablet   rosuvastatin (CRESTOR) 10 MG tablet     Musculoskeletal and Integument   Osteopenia    Disc need for calcium/ vitamin D/ wt bearing exercise and bone density test every 2 y to monitor Disc safety/ fracture risk in detail  She declines further dexa  Has fallen-no fx  Good D level         Other   Hyperlipidemia    Disc goals for lipids and reasons to control them Rev last labs with pt Rev low sat fat diet in detail LDL up a bit  On low dose crestor-tolerates this      Relevant Medications   furosemide (LASIX) 20 MG tablet   losartan (COZAAR) 100 MG tablet   omega-3 acid ethyl esters (LOVAZA) 1 g capsule   rosuvastatin (CRESTOR) 10 MG tablet   Vitamin D deficiency    Vitamin D level is therapeutic with current supplementation Disc importance of this to bone and overall health Level in 70s

## 2017-07-29 NOTE — Patient Instructions (Addendum)
If you are interested in the new shingles vaccine (Shingrix) - call your local pharmacy to check on coverage and availability  If affordable - get on a wait list at your pharmacy   A1C is up  Check blood sugar at different times - am or 2 hours after a meal  Watch diet the best you can   Follow up in 6 months

## 2017-07-31 NOTE — Assessment & Plan Note (Signed)
bp in fair control at this time  BP Readings from Last 1 Encounters:  07/29/17 124/70   No changes needed Most recent labs reviewed  Disc lifstyle change with low sodium diet and exercise

## 2017-07-31 NOTE — Assessment & Plan Note (Signed)
Disc goals for lipids and reasons to control them Rev last labs with pt Rev low sat fat diet in detail LDL up a bit  On low dose crestor-tolerates this

## 2017-07-31 NOTE — Assessment & Plan Note (Signed)
Vitamin D level is therapeutic with current supplementation Disc importance of this to bone and overall health Level in 70s 

## 2017-07-31 NOTE — Assessment & Plan Note (Signed)
Disc need for calcium/ vitamin D/ wt bearing exercise and bone density test every 2 y to monitor Disc safety/ fracture risk in detail  She declines further dexa  Has fallen-no fx  Good D level

## 2017-07-31 NOTE — Assessment & Plan Note (Signed)
Lab Results  Component Value Date   HGBA1C 7.8 (H) 07/20/2017   This is up  Pt declines more medication  Will work harder on DM diet  Disc foot/eye care F/u 6 mo

## 2017-08-26 ENCOUNTER — Other Ambulatory Visit: Payer: Self-pay

## 2017-08-26 ENCOUNTER — Emergency Department
Admission: EM | Admit: 2017-08-26 | Discharge: 2017-08-26 | Disposition: A | Discharge status: Home / Self Care | Payer: Medicare Other | Attending: Emergency Medicine | Admitting: Emergency Medicine

## 2017-08-26 DIAGNOSIS — Z794 Long term (current) use of insulin: Secondary | ICD-10-CM | POA: Insufficient documentation

## 2017-08-26 DIAGNOSIS — J301 Allergic rhinitis due to pollen: Secondary | ICD-10-CM | POA: Diagnosis not present

## 2017-08-26 DIAGNOSIS — Z96653 Presence of artificial knee joint, bilateral: Secondary | ICD-10-CM | POA: Diagnosis not present

## 2017-08-26 DIAGNOSIS — I10 Essential (primary) hypertension: Secondary | ICD-10-CM | POA: Diagnosis not present

## 2017-08-26 DIAGNOSIS — J302 Other seasonal allergic rhinitis: Secondary | ICD-10-CM | POA: Diagnosis not present

## 2017-08-26 DIAGNOSIS — H1132 Conjunctival hemorrhage, left eye: Secondary | ICD-10-CM | POA: Diagnosis not present

## 2017-08-26 DIAGNOSIS — E119 Type 2 diabetes mellitus without complications: Secondary | ICD-10-CM | POA: Diagnosis not present

## 2017-08-26 DIAGNOSIS — H5712 Ocular pain, left eye: Secondary | ICD-10-CM | POA: Diagnosis present

## 2017-08-26 DIAGNOSIS — Z7901 Long term (current) use of anticoagulants: Secondary | ICD-10-CM | POA: Insufficient documentation

## 2017-08-26 DIAGNOSIS — J45909 Unspecified asthma, uncomplicated: Secondary | ICD-10-CM | POA: Insufficient documentation

## 2017-08-26 DIAGNOSIS — Z79899 Other long term (current) drug therapy: Secondary | ICD-10-CM | POA: Diagnosis not present

## 2017-08-26 DIAGNOSIS — Z8673 Personal history of transient ischemic attack (TIA), and cerebral infarction without residual deficits: Secondary | ICD-10-CM | POA: Diagnosis not present

## 2017-08-26 MED ORDER — AZELASTINE HCL 0.1 % NA SOLN: 1.0000 | Freq: Two times a day (BID) | NASAL | 0 refills | Status: DC

## 2017-08-26 MED ORDER — CETIRIZINE HCL 10 MG PO TABS: 10.0000 mg | ORAL_TABLET | Freq: Every day | ORAL | 0 refills | Status: DC

## 2017-08-26 NOTE — ED Triage Notes (Signed)
Pt arrives to ED via POV from home with c/o left eye pain and swelling x3 days. No reported injury or trauma; pt denies any visual changes. Pt reports 3/10 pain, but only when blinking.

## 2017-08-26 NOTE — ED Notes (Signed)
Pt stated that she woke upon Monday and noticed some redness in her left eye and some swelling. Pt denies any injury to site.

## 2017-08-26 NOTE — ED Provider Notes (Signed)
[facility] Emergency Department Provider Note  ____________________________________________  Time seen: Approximately 9:06 PM  I have reviewed the triage vital signs and the nursing notes.   HISTORY  Chief Complaint Facial Swelling    HPI Lydia King is a 84 y.o. female who presents the emergency department complaining of left eye redness.  Patient presents the emergency department complaining of redness and possible swelling to the left eye x3 days.  Patient denies any pain, visual changes, drainage from the eye.  Patient states that she has a history of stroke, with vision loss in the past.  Patient reports that after 10 years, she has almost fully regained all vision to both of her eyes.  Patient wears glasses but does not wear contacts.  She denies any trauma to the area.  Patient reports that she has noticed redness to the inferior aspect of her eye.  She reports that she has no pain and was rubbing her eye.  Patient denies any headache, numbness or tingling in the extremity, difficulty formulating words, difficulty swallowing.  Patient states that symptoms are not consistent with previous strokes.  Patient's main concern is that she may have a condition affecting her regained eyesight.    Past Medical History:  Diagnosis Date  . Anemia    in past (from blood loss at hiatal hernia)  . Asthma   . Charcot-Marie-Tooth disease    walks with cane (fairly controlled)  . Diverticulosis   . DVT (deep venous thrombosis) ([place]) 2003   after sx  . GERD (gastroesophageal reflux disease)   . Grief reaction    on fluoxetine  . Headache(784.0)    migraine  . Hiatal hernia   . Hyperlipidemia   . Hypertension   . OA (ocular albinism) ([place])   . Osteopenia   . PUD (peptic ulcer disease)    gastric, past  . Shingles   . Stroke ([place]) 8/10   posterior reversible encephalopathy syndrome  . Urine incontinence     Patient Active Problem List   Diagnosis Date  Noted  . Vertigo 05/06/2017  . OSA (obstructive sleep apnea) 01/02/2016  . Pedal edema 01/02/2016  . Hyponatremia 01/02/2016  . Hyperkalemia 01/02/2016  . Fall in home 12/11/2015  . Poor balance 12/11/2015  . Low back pain with sciatica 10/02/2015  . Right hip pain 10/02/2015  . Hearing loss 04/18/2015  . Encounter for Medicare annual wellness exam 10/04/2013  . Diabetes mellitus ([place]) 08/31/2012  . CVA 09/12/2008  . OSTEOARTHRITIS, MULTIPLE JOINTS 05/30/2008  . Osteopenia 05/30/2008  . Vitamin D deficiency 07/14/2007  . SLEEP APNEA 07/14/2007  . Hyperlipidemia 05/26/2007  . Essential hypertension 11/30/2006  . Asthma 11/30/2006  . H/O gastroesophageal reflux (GERD) 11/30/2006  . G I BLEED 11/30/2006  . DEEP VENOUS THROMBOPHLEBITIS, HX OF 11/30/2006    Past Surgical History:  Procedure Laterality Date  . carotid dopplers  8/10  . COLONOSCOPY  2002  . HERNIA REPAIR  2002   bleeding  . HIATAL HERNIA REPAIR  2000   needed blood transfusion  . Stroke  8/10   hosp stroke/ posterior (vs posterior reversible encephalopathy syndrome) - d/c on Plavix  . TEE normal  3/35   no emoblic source  . TOTAL KNEE ARTHROPLASTY  03,06   Bilateral  . VESICOVAGINAL FISTULA CLOSURE W/ TAH  1964   with bleeding and cervical dysplasia    Prior to Admission medications   Medication Sig Start Date End Date Taking? Authorizing Provider  acetaminophen (TYLENOL) 650 MG CR tablet Take 650 mg by mouth every 8 (eight) hours as needed for pain.    [provider]  azelastine (ASTELIN) 0.1 % nasal spray Place 1 spray into both nostrils 2 (two) times daily. Use in each nostril as directed 08/26/17   Cuthriell, Charline Bills, PA-C  cetirizine (ZYRTEC) 10 MG tablet Take 1 tablet (10 mg total) by mouth daily. 08/26/17   Cuthriell, Charline Bills, PA-C  Chlorpheniramine Maleate ([facility] ALLERGY RELIEF PO) Take 1 tablet by mouth at bedtime as needed.    [provider]  Cholecalciferol (VITAMIN D) 2000  UNITS CAPS Take 2 capsules by mouth daily.      [provider]  clopidogrel (PLAVIX) 75 MG tablet Take 1 tablet (75 mg total) by mouth daily. 07/29/17   Tower, Wynelle Fanny, MD  esomeprazole (NEXIUM) 40 MG capsule Take 1 capsule (40 mg total) by mouth daily before breakfast. 07/29/17   Tower, Wynelle Fanny, MD  Flaxseed, Linseed, (FLAX SEED OIL) 1000 MG CAPS Take 1 capsule by mouth daily.      [provider]  FLUoxetine (PROZAC) 20 MG capsule Take 1 capsule (20 mg total) by mouth daily. 07/29/17   Tower, Wynelle Fanny, MD  furosemide (LASIX) 20 MG tablet Take 2 tablets (40 mg total) by mouth daily. 07/29/17   Tower, Marne A, MD  glucose blood (FREESTYLE LITE) test strip Use to check blood sugar once daily for DM (dx. X32.440) 07/14/17   Tower, Wynelle Fanny, MD  loratadine (CLARITIN) 10 MG tablet Take 1 tablet (10 mg total) by mouth daily. 07/29/17   Tower, Wynelle Fanny, MD  losartan (COZAAR) 100 MG tablet TAKE 1/2 TAB BY MOUTH ONCE A DAY 07/29/17   Tower, Wynelle Fanny, MD  metFORMIN (GLUCOPHAGE) 1000 MG tablet Take 1 tablet (1,000 mg total) by mouth 2 (two) times daily with a meal. 07/29/17   Tower, Wynelle Fanny, MD  Misc. Devices (WALKER) MISC Wheeled walker with seat- Pt must use for ambulation-- for use as directed: ambulation dx: 729.5, 780.79, 786.05, 784.0, 715.89.     [provider]  omega-3 acid ethyl esters (LOVAZA) 1 g capsule Take 1 capsule (1 g total) by mouth daily. 07/29/17   Tower, Wynelle Fanny, MD  PROAIR HFA 108 [phone] Base) MCG/ACT inhaler INHALE 2 PUFFS INTO THE LUNGS EVERY 4 (FOUR) HOURS AS NEEDED FOR WHEEZING OR SHORTNESS OF BREATH. 03/05/16   Tower, Wynelle Fanny, MD  rosuvastatin (CRESTOR) 10 MG tablet Take 1 tablet (10 mg total) by mouth at bedtime. 07/29/17   Tower, Wynelle Fanny, MD    Allergies Shrimp [shellfish allergy]; Amlodipine besylate; Atorvastatin; Ciprofloxacin; Codeine; Influenza vaccines; Nitrofurantoin; Omeprazole; Pantoprazole sodium; Penicillins; Prednisone; Simvastatin; and Sulfonamide  derivatives  Family History  Problem Relation Age of Onset  . Cerebral aneurysm Son   . Cerebral aneurysm Father   . Hyperlipidemia Father   . Alcohol abuse Father   . Stroke Mother   . Cervical cancer Sister   . Hyperlipidemia Brother   . Alcohol abuse Brother   . Stroke Brother   . Diabetes Brother   . Diabetes Sister   . Sarcoidosis Sister   . Other Sister        OP/ Broken hip  . Other Unknown        whole family has charcot marie tooth  . Ovarian cancer Sister     Social History Social History   Tobacco Use  . Smoking status: Never Smoker  . Smokeless tobacco:  Never Used  Substance Use Topics  . Alcohol use: No  . Drug use: No     Review of Systems  Constitutional: No fever/chills Eyes: No visual changes. No discharge.  Left eye redness.  Tenderness with palpation over the eye. ENT: No upper respiratory complaints. Cardiovascular: no chest pain. Respiratory: no cough. No SOB. Gastrointestinal: No abdominal pain.  No nausea, no vomiting.   Musculoskeletal: Negative for musculoskeletal pain. Skin: Negative for rash, abrasions, lacerations, ecchymosis. Neurological: Negative for headaches, focal weakness or numbness. 10-point ROS otherwise negative.  ____________________________________________   PHYSICAL EXAM:  VITAL SIGNS: ED Triage Vitals  Enc Vitals Group     BP 08/26/17 1947 (!) 169/67     Pulse Rate 08/26/17 1947 71     Resp 08/26/17 1947 18     Temp 08/26/17 1947 98.3 F (36.8 C)     Temp Source 08/26/17 1947 Oral     SpO2 08/26/17 1947 95 %     Weight 08/26/17 1949 168 lb (76.2 kg)     Height 08/26/17 1949 4\' 10"  (1.473 m)     Head Circumference --      Peak Flow --      Pain Score 08/26/17 1948 3     Pain Loc --      Pain Edu? --      Excl. in [place]? --      Constitutional: Alert and oriented. Well appearing and in no acute distress. Eyes: Conjunctiva on right is unremarkable.  Conjunctive on the left has subconjunctival hemorrhaging  in the lower half of the eye.Marland Kitchen PERRL. EOMI. funduscopic exam reveals red reflex bilaterally, vasculature and optic disc is unremarkable bilaterally.  No hyphema.  No visible foreign body to the external eye.  No drainage noted to the upper lower eyelids.  No tenderness to palpation along the orbital rim.  No palpable abnormality to the orbital rim. Head: Atraumatic. ENT:      Ears:       Nose: Mild congestion/rhinnorhea.  No evidence of body.  No tenderness to percussion over the frontal, ethmoid, maxillary sinuses.      Mouth/Throat: Mucous membranes are moist.  Neck: No stridor.    Cardiovascular: Normal rate, regular rhythm. Normal S1 and S2.  Good peripheral circulation. Respiratory: Normal respiratory effort without tachypnea or retractions. Lungs CTAB. Good air entry to the bases with no decreased or absent breath sounds. Musculoskeletal: Full range of motion to all extremities. No gross deformities appreciated. Neurologic:  Normal speech and language. No gross focal neurologic deficits are appreciated.  Cranial nerves II through XII grossly intact. Skin:  Skin is warm, dry and intact. No rash noted. Psychiatric: Mood and affect are normal. Speech and behavior are normal. Patient exhibits appropriate insight and judgement.   ____________________________________________   LABS (all labs ordered are listed, but only abnormal results are displayed)  Labs Reviewed - No data to display ____________________________________________  EKG   ____________________________________________  RADIOLOGY   No results found.  ____________________________________________    PROCEDURES  Procedure(s) performed:    Procedures    Medications - No data to display   ____________________________________________   INITIAL IMPRESSION / ASSESSMENT AND PLAN / ED COURSE  Pertinent labs & imaging results that were available during my care of the patient were reviewed by me and considered  in my medical decision making (see chart for details).  Review of the [place] CSRS was performed in accordance of the [place] prior to dispensing any controlled drugs.  Patient's diagnosis is consistent with subconjunctival hemorrhage, seasonal allergic rhinitis.  Patient's daughter reports that patient has had increased sneezing, coughing due to allergy symptoms prior to onset of left eye redness.  Patient's eye exam is reassuring.  Findings consistent with subconjunctival hemorrhaging.  Patient is likely cough/nasal hard enough to rupture small capillary.  At this time, no indication for further work-up.  Patient will follow-up with ophthalmology if any visual changes occur..  Otherwise, patient will follow primary care.  Patient is given ED precautions to return to the ED for any worsening or new symptoms.     ____________________________________________  FINAL CLINICAL IMPRESSION(S) / ED DIAGNOSES  Final diagnoses:  Subconjunctival hemorrhage, left  Seasonal allergic rhinitis due to pollen      NEW MEDICATIONS STARTED DURING THIS VISIT:  ED Discharge Orders        Ordered    cetirizine (ZYRTEC) 10 MG tablet  Daily     08/26/17 2106    azelastine (ASTELIN) 0.1 % nasal spray  2 times daily     08/26/17 2106          This chart was dictated using voice recognition software/Dragon. Despite best efforts to proofread, errors can occur which can change the meaning. Any change was purely unintentional.    Darletta Moll, PA-C 08/26/17 2250    Schuyler Amor, MD 08/26/17 2251

## 2017-09-21 ENCOUNTER — Encounter: Payer: Self-pay | Admitting: Family Medicine

## 2017-09-21 NOTE — Telephone Encounter (Signed)
Pt daughter called and requesting order be sent to [id number] - [place], [place] - [address] [phone] (Phone) [phone] (Fax)

## 2017-10-08 NOTE — Telephone Encounter (Signed)
See prv note and mychart message. please write new Rx for meter and supplies so I can send to pt's pharmacy

## 2017-10-11 MED ORDER — GLUCOSE BLOOD VI STRP: ORAL_STRIP | 3 refills | Status: DC

## 2017-10-11 MED ORDER — FREESTYLE LITE DEVI: 0 refills | Status: DC

## 2017-10-14 MED ORDER — FREESTYLE LANCETS MISC: 3 refills | Status: DC

## 2017-10-14 NOTE — Addendum Note (Signed)
Addended by: Loura Pardon A on: 10/14/2017 08:45 PM   Modules accepted: Orders

## 2017-10-27 DIAGNOSIS — R42 Dizziness and giddiness: Secondary | ICD-10-CM | POA: Diagnosis not present

## 2017-10-29 DIAGNOSIS — R42 Dizziness and giddiness: Secondary | ICD-10-CM | POA: Diagnosis not present

## 2017-11-01 DIAGNOSIS — J019 Acute sinusitis, unspecified: Secondary | ICD-10-CM | POA: Diagnosis not present

## 2017-11-26 ENCOUNTER — Encounter: Payer: Self-pay | Admitting: Family Medicine

## 2017-11-26 ENCOUNTER — Ambulatory Visit (INDEPENDENT_AMBULATORY_CARE_PROVIDER_SITE_OTHER): Payer: Medicare Other | Admitting: Family Medicine

## 2017-11-26 VITALS — BP 128/72 | HR 71 | Temp 97.9°F | Ht <= 58 in | Wt 166.8 lb

## 2017-11-26 DIAGNOSIS — J0101 Acute recurrent maxillary sinusitis: Secondary | ICD-10-CM | POA: Diagnosis not present

## 2017-11-26 DIAGNOSIS — S90219A Contusion of unspecified great toe with damage to nail, initial encounter: Secondary | ICD-10-CM | POA: Insufficient documentation

## 2017-11-26 DIAGNOSIS — S90212A Contusion of left great toe with damage to nail, initial encounter: Secondary | ICD-10-CM | POA: Diagnosis not present

## 2017-11-26 DIAGNOSIS — J019 Acute sinusitis, unspecified: Secondary | ICD-10-CM | POA: Insufficient documentation

## 2017-11-26 MED ORDER — DOXYCYCLINE HYCLATE 100 MG PO TABS: 100.0000 mg | ORAL_TABLET | Freq: Two times a day (BID) | ORAL | 0 refills | Status: DC

## 2017-11-26 NOTE — Progress Notes (Signed)
Subjective:    Patient ID: Lydia King, female    DOB: 03-07-1930, 84 y.o.   MRN: [id number]  HPI Here for right big toe redness/swelling   Facial pain- ? Sinusitis  Eye problem   Wt Readings from Last 3 Encounters:  11/26/17 166 lb 12 oz (75.6 kg)  08/26/17 168 lb (76.2 kg)  07/29/17 169 lb 12 oz (77 kg)   34.85 kg/m   R great toe is hurting  Red and swollen  3 weeks ago- got caught in the blanket - pain started before that  Was sore all summer   Face hurts on R side  R forehead  Congestion - using nasal saline  Most on the R side   Had recent dizziness- went to ENT  Did ENG- thinks she was improved  Then had a sinus infection - went to UC on a Sunday - 2 weeks ago Given zpak (no help) and then doxycycline (some better but not all the way)   Unsure what color her nasal d/c is  Some cough -dry cough /throat clearing  No fever  Thinks her R eye is red   Patient Active Problem List   Diagnosis Date Noted  . Acute sinusitis 11/26/2017  . Contusion of great toe with damage to nail 11/26/2017  . Vertigo 05/06/2017  . OSA (obstructive sleep apnea) 01/02/2016  . Pedal edema 01/02/2016  . Hyponatremia 01/02/2016  . Hyperkalemia 01/02/2016  . Fall in home 12/11/2015  . Poor balance 12/11/2015  . Low back pain with sciatica 10/02/2015  . Right hip pain 10/02/2015  . Hearing loss 04/18/2015  . Encounter for Medicare annual wellness exam 10/04/2013  . Diabetes mellitus ([place]) 08/31/2012  . CVA 09/12/2008  . OSTEOARTHRITIS, MULTIPLE JOINTS 05/30/2008  . Osteopenia 05/30/2008  . Vitamin D deficiency 07/14/2007  . SLEEP APNEA 07/14/2007  . Hyperlipidemia 05/26/2007  . Essential hypertension 11/30/2006  . Asthma 11/30/2006  . H/O gastroesophageal reflux (GERD) 11/30/2006  . G I BLEED 11/30/2006  . DEEP VENOUS THROMBOPHLEBITIS, HX OF 11/30/2006   Past Medical History:  Diagnosis Date  . Anemia    in past (from blood loss at hiatal hernia)  . Asthma   .  Charcot-Marie-Tooth disease    walks with cane (fairly controlled)  . Diverticulosis   . DVT (deep venous thrombosis) ([place]) 2003   after sx  . GERD (gastroesophageal reflux disease)   . Grief reaction    on fluoxetine  . Headache(784.0)    migraine  . Hiatal hernia   . Hyperlipidemia   . Hypertension   . OA (ocular albinism) ([place])   . Osteopenia   . PUD (peptic ulcer disease)    gastric, past  . Shingles   . Stroke ([place]) 8/10   posterior reversible encephalopathy syndrome  . Urine incontinence    Past Surgical History:  Procedure Laterality Date  . carotid dopplers  8/10  . COLONOSCOPY  2002  . HERNIA REPAIR  2002   bleeding  . HIATAL HERNIA REPAIR  2000   needed blood transfusion  . Stroke  8/10   hosp stroke/ posterior (vs posterior reversible encephalopathy syndrome) - d/c on Plavix  . TEE normal  2/97   no emoblic source  . TOTAL KNEE ARTHROPLASTY  03,06   Bilateral  . VESICOVAGINAL FISTULA CLOSURE W/ TAH  1964   with bleeding and cervical dysplasia   Social History   Tobacco Use  . Smoking status: Never Smoker  . Smokeless tobacco:  Never Used  Substance Use Topics  . Alcohol use: No  . Drug use: No   Family History  Problem Relation Age of Onset  . Cerebral aneurysm Son   . Cerebral aneurysm Father   . Hyperlipidemia Father   . Alcohol abuse Father   . Stroke Mother   . Cervical cancer Sister   . Hyperlipidemia Brother   . Alcohol abuse Brother   . Stroke Brother   . Diabetes Brother   . Diabetes Sister   . Sarcoidosis Sister   . Other Sister        OP/ Broken hip  . Other Unknown        whole family has charcot marie tooth  . Ovarian cancer Sister    Allergies  Allergen Reactions  . Shrimp [Shellfish Allergy] Anaphylaxis    Anaphylaxis-swelling of throat/sob    . Amlodipine Besylate     REACTION: edema  . Atorvastatin     REACTION: pain- could not walk  . Ciprofloxacin     REACTION: unkown  . Codeine     REACTION: itchy rash  .  Influenza Vaccines Other (See Comments)    Arm swelling, redness, chills, fever  . Nitrofurantoin   . Omeprazole     REACTION: not effective  . Pantoprazole Sodium     REACTION: does not work well  . Penicillins     REACTION: itchy rash  . Prednisone     REACTION: RASH.// SHORTNESS OF BREATH  . Simvastatin     REACTION: leg pain  . Sulfonamide Derivatives     REACTION: itchy rash   Current Outpatient Medications on File Prior to Visit  Medication Sig Dispense Refill  . acetaminophen (TYLENOL) 650 MG CR tablet Take 650 mg by mouth every 8 (eight) hours as needed for pain.    Marland Kitchen azelastine (ASTELIN) 0.1 % nasal spray Place 1 spray into both nostrils 2 (two) times daily. Use in each nostril as directed 30 mL 0  . Blood Glucose Monitoring Suppl (FREESTYLE LITE) DEVI Use to check blood glucose daily and as needed for diabetes 1 each 0  . cetirizine (ZYRTEC) 10 MG tablet Take 1 tablet (10 mg total) by mouth daily. 30 tablet 0  . Chlorpheniramine Maleate ([facility]S ALLERGY RELIEF PO) Take 1 tablet by mouth at bedtime as needed.    . Cholecalciferol (VITAMIN D) 2000 UNITS CAPS Take 2 capsules by mouth daily.      . clopidogrel (PLAVIX) 75 MG tablet Take 1 tablet (75 mg total) by mouth daily. 90 tablet 3  . esomeprazole (NEXIUM) 40 MG capsule Take 1 capsule (40 mg total) by mouth daily before breakfast. 90 capsule 3  . Flaxseed, Linseed, (FLAX SEED OIL) 1000 MG CAPS Take 1 capsule by mouth daily.      Marland Kitchen FLUoxetine (PROZAC) 20 MG capsule Take 1 capsule (20 mg total) by mouth daily. 90 capsule 3  . furosemide (LASIX) 20 MG tablet Take 2 tablets (40 mg total) by mouth daily. 180 tablet 3  . glucose blood (FREESTYLE LITE) test strip Use to check blood sugar once daily for DM (dx. E11.618) 100 each 3  . Lancets (FREESTYLE) lancets Use as instructed 100 each 3  . loratadine (CLARITIN) 10 MG tablet Take 1 tablet (10 mg total) by mouth daily. 90 tablet 3  . losartan (COZAAR) 100 MG tablet TAKE 1/2 TAB BY  MOUTH ONCE A DAY 45 tablet 3  . metFORMIN (GLUCOPHAGE) 1000 MG tablet Take 1 tablet (1,000 mg  total) by mouth 2 (two) times daily with a meal. 180 tablet 3  . Misc. Devices (WALKER) MISC Wheeled walker with seat- Pt must use for ambulation-- for use as directed: ambulation dx: 729.5, 780.79, 786.05, 784.0, 715.89.     Marland Kitchen omega-3 acid ethyl esters (LOVAZA) 1 g capsule Take 1 capsule (1 g total) by mouth daily. 90 capsule 3  . PROAIR HFA 108 (90 Base) MCG/ACT inhaler INHALE 2 PUFFS INTO THE LUNGS EVERY 4 (FOUR) HOURS AS NEEDED FOR WHEEZING OR SHORTNESS OF BREATH. 8.5 g 3  . rosuvastatin (CRESTOR) 10 MG tablet Take 1 tablet (10 mg total) by mouth at bedtime. 90 tablet 3   No current facility-administered medications on file prior to visit.     Review of Systems  Constitutional: Positive for appetite change. Negative for fatigue and fever.  HENT: Positive for congestion, ear pain, postnasal drip, rhinorrhea, sinus pressure and sore throat. Negative for nosebleeds.   Eyes: Negative for pain, redness and itching.  Respiratory: Positive for cough. Negative for shortness of breath and wheezing.   Cardiovascular: Negative for chest pain.  Gastrointestinal: Negative for abdominal pain, diarrhea, nausea and vomiting.  Endocrine: Negative for polyuria.  Genitourinary: Negative for dysuria, frequency and urgency.  Musculoskeletal: Negative for arthralgias and myalgias.       R great toe pain and redness  Skin:       Toe redness/pain   Allergic/Immunologic: Negative for immunocompromised state.  Neurological: Positive for headaches. Negative for dizziness, tremors, syncope, weakness and numbness.  Hematological: Negative for adenopathy. Does not bruise/bleed easily.  Psychiatric/Behavioral: Negative for dysphoric mood. The patient is not nervous/anxious.        Objective:   Physical Exam  Constitutional: She appears well-developed and well-nourished. No distress.  Frail but well appearing elderly  female    HENT:  Head: Normocephalic and atraumatic.  Right Ear: External ear normal.  Left Ear: External ear normal.  Mouth/Throat: Oropharynx is clear and moist. No oropharyngeal exudate.  Nares are injected and congested  Bilateral maxillary sinus tenderness (much worse on R)  R frontal maxillary tenderness Post nasal drip   Eyes: Pupils are equal, round, and reactive to light. Conjunctivae and EOM are normal. Right eye exhibits no discharge. Left eye exhibits no discharge.  Neck: Normal range of motion. Neck supple.  Cardiovascular: Normal rate and regular rhythm.  Pulmonary/Chest: Effort normal and breath sounds normal. No respiratory distress. She has no wheezes. She has no rales.  Lymphadenopathy:    She has no cervical adenopathy.  Neurological: She is alert. No cranial nerve deficit.  Skin: Skin is warm and dry. No rash noted.  R great toe- erythema laterally and at tip of toe  Nail is ecchymotic proximally and slightly loose (partially avulsed)  Mildly tender over nail  No drainage Not swollen  Nl rom of toe and foot    Psychiatric: She has a normal mood and affect.  Pleasant           Assessment & Plan:   Problem List Items Addressed This Visit      Respiratory   Acute sinusitis    Partially tx with doxycycline from UC - some residual pain and congestion after 2 more weeks  Continue doxy course for 14 more days Saline prn  Warm compresses on sinuses  Steroid nasal spray if needed  Update if not starting to improve in a week or if worsening        Relevant Medications   doxycycline (  VIBRA-TABS) 100 MG tablet     Musculoskeletal and Integument   Contusion of great toe with damage to nail - Primary    R great toe has some erythema/swelling and nail change that appears traumatic  Nail is loose/partially avulsed (may loose it)  Recommend warm compress Doxycycline 100 mg bid  Epsom salt soak is ok  Keep clean with soap and water/elevate Update if not  starting to improve in a week or if worsening  (esp if inc redness or streaking)

## 2017-11-26 NOTE — Patient Instructions (Signed)
Warm compresses or epsom salt for toe  You may loose nail  Elevate foot when you can  Take doxycycline  Drink fluids Use nasal saline as needed   Update if not starting to improve in a week or if worsening

## 2017-11-28 NOTE — Assessment & Plan Note (Signed)
R great toe has some erythema/swelling and nail change that appears traumatic  Nail is loose/partially avulsed (may loose it)  Recommend warm compress Doxycycline 100 mg bid  Epsom salt soak is ok  Keep clean with soap and water/elevate Update if not starting to improve in a week or if worsening  (esp if inc redness or streaking)

## 2017-11-28 NOTE — Assessment & Plan Note (Signed)
Partially tx with doxycycline from UC - some residual pain and congestion after 2 more weeks  Continue doxy course for 14 more days Saline prn  Warm compresses on sinuses  Steroid nasal spray if needed  Update if not starting to improve in a week or if worsening

## 2017-12-12 ENCOUNTER — Other Ambulatory Visit: Payer: Self-pay

## 2017-12-12 ENCOUNTER — Emergency Department: Payer: Medicare Other

## 2017-12-12 ENCOUNTER — Emergency Department
Admission: EM | Admit: 2017-12-12 | Discharge: 2017-12-12 | Disposition: A | Discharge status: Home / Self Care | Payer: Medicare Other | Attending: Emergency Medicine | Admitting: Emergency Medicine

## 2017-12-12 DIAGNOSIS — S3992XA Unspecified injury of lower back, initial encounter: Secondary | ICD-10-CM | POA: Diagnosis not present

## 2017-12-12 DIAGNOSIS — Z79899 Other long term (current) drug therapy: Secondary | ICD-10-CM | POA: Diagnosis not present

## 2017-12-12 DIAGNOSIS — S0990XA Unspecified injury of head, initial encounter: Secondary | ICD-10-CM | POA: Diagnosis not present

## 2017-12-12 DIAGNOSIS — Z96651 Presence of right artificial knee joint: Secondary | ICD-10-CM | POA: Diagnosis not present

## 2017-12-12 DIAGNOSIS — W010XXA Fall on same level from slipping, tripping and stumbling without subsequent striking against object, initial encounter: Secondary | ICD-10-CM | POA: Diagnosis not present

## 2017-12-12 DIAGNOSIS — Y9301 Activity, walking, marching and hiking: Secondary | ICD-10-CM | POA: Diagnosis not present

## 2017-12-12 DIAGNOSIS — Z8673 Personal history of transient ischemic attack (TIA), and cerebral infarction without residual deficits: Secondary | ICD-10-CM | POA: Diagnosis not present

## 2017-12-12 DIAGNOSIS — M25521 Pain in right elbow: Secondary | ICD-10-CM | POA: Insufficient documentation

## 2017-12-12 DIAGNOSIS — Z7902 Long term (current) use of antithrombotics/antiplatelets: Secondary | ICD-10-CM | POA: Insufficient documentation

## 2017-12-12 DIAGNOSIS — S199XXA Unspecified injury of neck, initial encounter: Secondary | ICD-10-CM | POA: Diagnosis not present

## 2017-12-12 DIAGNOSIS — M545 Low back pain: Secondary | ICD-10-CM | POA: Diagnosis not present

## 2017-12-12 DIAGNOSIS — Y998 Other external cause status: Secondary | ICD-10-CM | POA: Insufficient documentation

## 2017-12-12 DIAGNOSIS — Y92096 Garden or yard of other non-institutional residence as the place of occurrence of the external cause: Secondary | ICD-10-CM | POA: Diagnosis not present

## 2017-12-12 DIAGNOSIS — W19XXXA Unspecified fall, initial encounter: Secondary | ICD-10-CM

## 2017-12-12 DIAGNOSIS — M25511 Pain in right shoulder: Secondary | ICD-10-CM | POA: Diagnosis not present

## 2017-12-12 DIAGNOSIS — Z7984 Long term (current) use of oral hypoglycemic drugs: Secondary | ICD-10-CM | POA: Diagnosis not present

## 2017-12-12 DIAGNOSIS — S4991XA Unspecified injury of right shoulder and upper arm, initial encounter: Secondary | ICD-10-CM | POA: Diagnosis not present

## 2017-12-12 DIAGNOSIS — Z96652 Presence of left artificial knee joint: Secondary | ICD-10-CM | POA: Insufficient documentation

## 2017-12-12 DIAGNOSIS — E119 Type 2 diabetes mellitus without complications: Secondary | ICD-10-CM | POA: Diagnosis not present

## 2017-12-12 DIAGNOSIS — J45909 Unspecified asthma, uncomplicated: Secondary | ICD-10-CM | POA: Insufficient documentation

## 2017-12-12 DIAGNOSIS — I10 Essential (primary) hypertension: Secondary | ICD-10-CM | POA: Diagnosis not present

## 2017-12-12 DIAGNOSIS — M25522 Pain in left elbow: Secondary | ICD-10-CM | POA: Diagnosis not present

## 2017-12-12 DIAGNOSIS — S59901A Unspecified injury of right elbow, initial encounter: Secondary | ICD-10-CM | POA: Diagnosis not present

## 2017-12-12 MED ORDER — ACETAMINOPHEN 500 MG PO TABS
1000.0000 mg | ORAL_TABLET | Freq: Once | ORAL | Status: AC
  Administered 2017-12-12: 1000 mg via ORAL
  Filled 2017-12-12: qty 2

## 2017-12-12 NOTE — ED Triage Notes (Addendum)
Pt states fell from standing position while ambulating tonight on concrete floor. Pt injured right shoulder, right elbow, right knee, left wrist and left ankle. Pt assisted out of car with cane without difficulty, no obvious deformity noted. Pt also complains of coccyx pain. Pt states she did not strike head and denies neck pain or loc.
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
## 2017-12-12 NOTE — ED Notes (Signed)
Pt fell tonight  Got  Foot   Tangled  Up  And she fell  And she  inj her r  Elbow  And  Right shoulder   And  Lower  Back   Pt  Did not hit her head   She  Is  Awake and alert   She did not pass out   She  Was  Able to ambulate with her cane

## 2018-01-05 DIAGNOSIS — H4010X0 Unspecified open-angle glaucoma, stage unspecified: Secondary | ICD-10-CM | POA: Diagnosis not present

## 2018-01-05 DIAGNOSIS — J309 Allergic rhinitis, unspecified: Secondary | ICD-10-CM | POA: Diagnosis not present

## 2018-01-05 DIAGNOSIS — H6123 Impacted cerumen, bilateral: Secondary | ICD-10-CM | POA: Diagnosis not present

## 2018-01-05 DIAGNOSIS — J019 Acute sinusitis, unspecified: Secondary | ICD-10-CM | POA: Diagnosis not present

## 2018-01-05 DIAGNOSIS — R51 Headache: Secondary | ICD-10-CM | POA: Diagnosis not present

## 2018-01-05 DIAGNOSIS — K219 Gastro-esophageal reflux disease without esophagitis: Secondary | ICD-10-CM | POA: Diagnosis not present

## 2018-02-03 ENCOUNTER — Ambulatory Visit: Payer: Medicare Other | Admitting: Family Medicine

## 2018-02-05 ENCOUNTER — Encounter: Payer: Self-pay | Admitting: Family Medicine

## 2018-02-05 ENCOUNTER — Ambulatory Visit (INDEPENDENT_AMBULATORY_CARE_PROVIDER_SITE_OTHER): Payer: Medicare Other | Admitting: Family Medicine

## 2018-02-05 VITALS — BP 130/78 | HR 69 | Temp 97.9°F | Ht <= 58 in | Wt 166.2 lb

## 2018-02-05 DIAGNOSIS — E11618 Type 2 diabetes mellitus with other diabetic arthropathy: Secondary | ICD-10-CM | POA: Diagnosis not present

## 2018-02-05 DIAGNOSIS — E7800 Pure hypercholesterolemia, unspecified: Secondary | ICD-10-CM | POA: Diagnosis not present

## 2018-02-05 DIAGNOSIS — I10 Essential (primary) hypertension: Secondary | ICD-10-CM | POA: Diagnosis not present

## 2018-02-05 DIAGNOSIS — R42 Dizziness and giddiness: Secondary | ICD-10-CM | POA: Diagnosis not present

## 2018-02-05 DIAGNOSIS — R0981 Nasal congestion: Secondary | ICD-10-CM

## 2018-02-05 LAB — POCT GLYCOSYLATED HEMOGLOBIN (HGB A1C): HEMOGLOBIN A1C: 6.7 % — AB (ref 4.0–5.6)

## 2018-02-05 NOTE — Patient Instructions (Addendum)
Replace the current [facility] sinus pill with over the counter guiafenesin and phenylephrine   Guaifenesin is an expectorant- it loosen mucous in head and chest  Brand name is mucinex and there are store brands also   Phenylephrine is a decongestant that does not raise blood pressure  Brand name is chlorcedin [employer]pb and there are store brands also   A1C is improved Lab Results  Component Value Date   HGBA1C 6.7 (A) 02/05/2018   Keep watching your diet  Walk in the house for exercise with your cane in safe areas   We will see you in June

## 2018-02-05 NOTE — Progress Notes (Signed)
Subjective:    Patient ID: Lydia King, female    DOB: 05-06-1930, 85 y.o.   MRN: [id number]  HPI Here for f/u of chronic health problems  Doing ok overall   Still having dizziness  Went to ENT a few times  Taught her the exercises and they help Last episode - she woke up with vertigo and got better after 3 sets of exercises   Better with ear and sinus issues  She has chronic pnd  Combo pill with guaifen and acetaminophen and phenylephrine    Wt Readings from Last 3 Encounters:  02/05/18 166 lb 4 oz (75.4 kg)  12/12/17 163 lb (73.9 kg)  11/26/17 166 lb 12 oz (75.6 kg)  wt is up 3 lb  34.75 kg/m    bp is stable today  No cp or palpitations or headaches or edema  No side effects to medicines  BP Readings from Last 3 Encounters:  02/05/18 130/78  12/12/17 (!) 152/60  11/26/17 128/72      DM2 Lab Results  Component Value Date   HGBA1C 7.8 (H) 07/20/2017   Declined more medication at that time  Lab Results  Component Value Date   HGBA1C 6.7 (A) 02/05/2018  much improved  Cutting back on bread and crackers and starches   Very little exercise due to her chronic low back pain  Tries to move legs and arms in a chair  Eye exam is due in march    Cholesterol  Lab Results  Component Value Date   CHOL 194 07/20/2017   HDL 59.60 07/20/2017   LDLCALC 107 (H) 07/20/2017   LDLDIRECT 78.0 07/14/2016   TRIG 137.0 07/20/2017   CHOLHDL 3 07/20/2017   crestor and diet   Patient Active Problem List   Diagnosis Date Noted  . Sinus congestion 02/05/2018  . Contusion of great toe with damage to nail 11/26/2017  . Vertigo 05/06/2017  . OSA (obstructive sleep apnea) 01/02/2016  . Pedal edema 01/02/2016  . Hyponatremia 01/02/2016  . Hyperkalemia 01/02/2016  . Fall in home 12/11/2015  . Poor balance 12/11/2015  . Low back pain with sciatica 10/02/2015  . Right hip pain 10/02/2015  . Hearing loss 04/18/2015  . Encounter for Medicare annual wellness exam  10/04/2013  . Diabetes mellitus ([place]) 08/31/2012  . CVA 09/12/2008  . OSTEOARTHRITIS, MULTIPLE JOINTS 05/30/2008  . Osteopenia 05/30/2008  . Vitamin D deficiency 07/14/2007  . SLEEP APNEA 07/14/2007  . Hyperlipidemia 05/26/2007  . Essential hypertension 11/30/2006  . Asthma 11/30/2006  . H/O gastroesophageal reflux (GERD) 11/30/2006  . G I BLEED 11/30/2006  . DEEP VENOUS THROMBOPHLEBITIS, HX OF 11/30/2006   Past Medical History:  Diagnosis Date  . Anemia    in past (from blood loss at hiatal hernia)  . Asthma   . Charcot-Marie-Tooth disease    walks with cane (fairly controlled)  . Diverticulosis   . DVT (deep venous thrombosis) ([place]) 2003   after sx  . GERD (gastroesophageal reflux disease)   . Grief reaction    on fluoxetine  . Headache(784.0)    migraine  . Hiatal hernia   . Hyperlipidemia   . Hypertension   . OA (ocular albinism) ([place])   . Osteopenia   . PUD (peptic ulcer disease)    gastric, past  . Shingles   . Stroke ([place]) 8/10   posterior reversible encephalopathy syndrome  . Urine incontinence    Past Surgical History:  Procedure Laterality Date  . carotid dopplers  8/10  . COLONOSCOPY  2002  . HERNIA REPAIR  2002   bleeding  . HIATAL HERNIA REPAIR  2000   needed blood transfusion  . Stroke  8/10   hosp stroke/ posterior (vs posterior reversible encephalopathy syndrome) - d/c on Plavix  . TEE normal  3/66   no emoblic source  . TOTAL KNEE ARTHROPLASTY  03,06   Bilateral  . VESICOVAGINAL FISTULA CLOSURE W/ TAH  1964   with bleeding and cervical dysplasia   Social History   Tobacco Use  . Smoking status: Never Smoker  . Smokeless tobacco: Never Used  Substance Use Topics  . Alcohol use: No  . Drug use: No   Family History  Problem Relation Age of Onset  . Cerebral aneurysm Son   . Cerebral aneurysm Father   . Hyperlipidemia Father   . Alcohol abuse Father   . Stroke Mother   . Cervical cancer Sister   . Hyperlipidemia Brother   .  Alcohol abuse Brother   . Stroke Brother   . Diabetes Brother   . Diabetes Sister   . Sarcoidosis Sister   . Other Sister        OP/ Broken hip  . Other Unknown        whole family has charcot marie tooth  . Ovarian cancer Sister    Allergies  Allergen Reactions  . Shrimp [Shellfish Allergy] Anaphylaxis    Anaphylaxis-swelling of throat/sob    . Amlodipine Besylate     REACTION: edema  . Atorvastatin     REACTION: pain- could not walk  . Ciprofloxacin     REACTION: unkown  . Codeine     REACTION: itchy rash  . Influenza Vaccines Other (See Comments)    Arm swelling, redness, chills, fever  . Nitrofurantoin   . Omeprazole     REACTION: not effective  . Pantoprazole Sodium     REACTION: does not work well  . Penicillins     REACTION: itchy rash  . Prednisone     REACTION: RASH.// SHORTNESS OF BREATH  . Simvastatin     REACTION: leg pain  . Sulfonamide Derivatives     REACTION: itchy rash   Current Outpatient Medications on File Prior to Visit  Medication Sig Dispense Refill  . acetaminophen (TYLENOL) 650 MG CR tablet Take 650 mg by mouth every 8 (eight) hours as needed for pain.    Marland Kitchen azelastine (ASTELIN) 0.1 % nasal spray Place 1 spray into both nostrils 2 (two) times daily. Use in each nostril as directed 30 mL 0  . Blood Glucose Monitoring Suppl (FREESTYLE LITE) DEVI Use to check blood glucose daily and as needed for diabetes 1 each 0  . cetirizine (ZYRTEC) 10 MG tablet Take 1 tablet (10 mg total) by mouth daily. 30 tablet 0  . Cholecalciferol (VITAMIN D) 2000 UNITS CAPS Take 2 capsules by mouth daily.      . clopidogrel (PLAVIX) 75 MG tablet Take 1 tablet (75 mg total) by mouth daily. 90 tablet 3  . esomeprazole (NEXIUM) 40 MG capsule Take 1 capsule (40 mg total) by mouth daily before breakfast. 90 capsule 3  . Flaxseed, Linseed, (FLAX SEED OIL) 1000 MG CAPS Take 1 capsule by mouth daily.      Marland Kitchen FLUoxetine (PROZAC) 20 MG capsule Take 1 capsule (20 mg total) by mouth  daily. 90 capsule 3  . furosemide (LASIX) 20 MG tablet Take 2 tablets (40 mg total) by mouth daily. 180 tablet 3  .  glucose blood (FREESTYLE LITE) test strip Use to check blood sugar once daily for DM (dx. E11.618) 100 each 3  . Lancets (FREESTYLE) lancets Use as instructed 100 each 3  . losartan (COZAAR) 100 MG tablet TAKE 1/2 TAB BY MOUTH ONCE A DAY 45 tablet 3  . metFORMIN (GLUCOPHAGE) 1000 MG tablet Take 1 tablet (1,000 mg total) by mouth 2 (two) times daily with a meal. 180 tablet 3  . Misc. Devices (WALKER) MISC Wheeled walker with seat- Pt must use for ambulation-- for use as directed: ambulation dx: 729.5, 780.79, 786.05, 784.0, 715.89.     Marland Kitchen omega-3 acid ethyl esters (LOVAZA) 1 g capsule Take 1 capsule (1 g total) by mouth daily. 90 capsule 3  . PROAIR HFA 108 (90 Base) MCG/ACT inhaler INHALE 2 PUFFS INTO THE LUNGS EVERY 4 (FOUR) HOURS AS NEEDED FOR WHEEZING OR SHORTNESS OF BREATH. 8.5 g 3  . rosuvastatin (CRESTOR) 10 MG tablet Take 1 tablet (10 mg total) by mouth at bedtime. 90 tablet 3  . loratadine (CLARITIN) 10 MG tablet Take 1 tablet (10 mg total) by mouth daily. (Patient not taking: Reported on 02/05/2018) 90 tablet 3   No current facility-administered medications on file prior to visit.      Review of Systems  Constitutional: Positive for fatigue. Negative for activity change, appetite change, fever and unexpected weight change.       Fatigues easily   HENT: Positive for congestion and postnasal drip. Negative for ear pain, rhinorrhea, sinus pressure and sore throat.   Eyes: Negative for pain, redness and visual disturbance.  Respiratory: Negative for cough, shortness of breath and wheezing.   Cardiovascular: Negative for chest pain and palpitations.  Gastrointestinal: Negative for abdominal pain, blood in stool, constipation and diarrhea.  Endocrine: Negative for polydipsia and polyuria.  Genitourinary: Negative for dysuria, frequency and urgency.  Musculoskeletal: Positive  for arthralgias and back pain. Negative for myalgias.  Skin: Negative for pallor and rash.  Allergic/Immunologic: Negative for environmental allergies.  Neurological: Negative for dizziness, tremors, seizures, syncope, facial asymmetry, speech difficulty, weakness, light-headedness, numbness and headaches.  Hematological: Negative for adenopathy. Does not bruise/bleed easily.  Psychiatric/Behavioral: Negative for decreased concentration and dysphoric mood. The patient is not nervous/anxious.        Objective:   Physical Exam Constitutional:      General: She is not in acute distress.    Appearance: Normal appearance. She is well-developed. She is obese. She is not ill-appearing.  HENT:     Head: Normocephalic and atraumatic.     Nose: Congestion present. No rhinorrhea.     Mouth/Throat:     Mouth: Mucous membranes are moist.     Pharynx: Oropharynx is clear. No posterior oropharyngeal erythema.     Comments: Occasionally clears throat  Eyes:     General: No scleral icterus.    Conjunctiva/sclera: Conjunctivae normal.     Pupils: Pupils are equal, round, and reactive to light.  Neck:     Musculoskeletal: Normal range of motion and neck supple. No neck rigidity.     Thyroid: No thyromegaly.     Vascular: No carotid bruit or JVD.  Cardiovascular:     Rate and Rhythm: Normal rate and regular rhythm.     Heart sounds: Normal heart sounds. No gallop.   Pulmonary:     Effort: Pulmonary effort is normal. No respiratory distress.     Breath sounds: Normal breath sounds. No wheezing, rhonchi or rales.  Abdominal:  General: Bowel sounds are normal. There is no distension or abdominal bruit.     Palpations: Abdomen is soft. There is no mass.     Tenderness: There is no abdominal tenderness.  Musculoskeletal:     Right lower leg: No edema.     Left lower leg: No edema.  Lymphadenopathy:     Cervical: No cervical adenopathy.  Skin:    General: Skin is warm and dry.     Capillary  Refill: Capillary refill takes less than 2 seconds.     Findings: No rash.  Neurological:     Mental Status: She is alert. Mental status is at baseline.     Deep Tendon Reflexes: Reflexes are normal and symmetric. Reflexes normal.  Psychiatric:        Mood and Affect: Mood normal.           Assessment & Plan:   Problem List Items Addressed This Visit      Cardiovascular and Mediastinum   Essential hypertension - Primary    bp in fair control at this time  BP Readings from Last 1 Encounters:  02/05/18 130/78   No changes needed Most recent labs reviewed  Disc lifstyle change with low sodium diet and exercise          Respiratory   Sinus congestion    Chronic with sinusitis in the past The combo med otc pt used has acetaminophen in it (discoraged this due to the fact she already takes that) Recommend she get guaifenesin and phenylephrine otc separately         Endocrine   Diabetes mellitus ([place])    Improved with better diet  Lab Results  Component Value Date   HGBA1C 6.7 (A) 02/05/2018   Enc any exercise she tolerates Disc eye and foot care Eye exam due in march       Relevant Orders   POCT glycosylated hemoglobin (Hb A1C) (Completed)     Other   Vertigo    Improved after visit to ENT She now does exercises when dizzy and has been able to stop it        Hyperlipidemia    Disc goals for lipids and reasons to control them Rev last labs with pt Rev low sat fat diet in detail Continues crestor and diet

## 2018-02-06 NOTE — Assessment & Plan Note (Signed)
Chronic with sinusitis in the past The combo med otc pt used has acetaminophen in it (discoraged this due to the fact she already takes that) Recommend she get guaifenesin and phenylephrine otc separately

## 2018-02-06 NOTE — Assessment & Plan Note (Signed)
Improved after visit to ENT She now does exercises when dizzy and has been able to stop it

## 2018-02-06 NOTE — Assessment & Plan Note (Signed)
Improved with better diet  Lab Results  Component Value Date   HGBA1C 6.7 (A) 02/05/2018   Enc any exercise she tolerates Disc eye and foot care Eye exam due in march

## 2018-02-06 NOTE — Assessment & Plan Note (Signed)
bp in fair control at this time  BP Readings from Last 1 Encounters:  02/05/18 130/78   No changes needed Most recent labs reviewed  Disc lifstyle change with low sodium diet and exercise

## 2018-02-06 NOTE — Assessment & Plan Note (Signed)
Disc goals for lipids and reasons to control them Rev last labs with pt Rev low sat fat diet in detail  Continues crestor and diet  

## 2018-02-25 DIAGNOSIS — R42 Dizziness and giddiness: Secondary | ICD-10-CM | POA: Diagnosis not present

## 2018-03-11 DIAGNOSIS — R42 Dizziness and giddiness: Secondary | ICD-10-CM | POA: Diagnosis not present

## 2018-03-25 DIAGNOSIS — R42 Dizziness and giddiness: Secondary | ICD-10-CM | POA: Diagnosis not present

## 2018-04-01 DIAGNOSIS — R42 Dizziness and giddiness: Secondary | ICD-10-CM | POA: Diagnosis not present

## 2018-04-08 DIAGNOSIS — R42 Dizziness and giddiness: Secondary | ICD-10-CM | POA: Diagnosis not present

## 2018-04-15 DIAGNOSIS — H9312 Tinnitus, left ear: Secondary | ICD-10-CM | POA: Diagnosis not present

## 2018-04-15 DIAGNOSIS — H903 Sensorineural hearing loss, bilateral: Secondary | ICD-10-CM | POA: Diagnosis not present

## 2018-04-15 DIAGNOSIS — R42 Dizziness and giddiness: Secondary | ICD-10-CM | POA: Diagnosis not present

## 2018-04-15 DIAGNOSIS — H60332 Swimmer's ear, left ear: Secondary | ICD-10-CM | POA: Diagnosis not present

## 2018-04-16 DIAGNOSIS — R42 Dizziness and giddiness: Secondary | ICD-10-CM | POA: Diagnosis not present

## 2018-04-21 ENCOUNTER — Telehealth: Payer: Self-pay | Admitting: *Deleted

## 2018-04-21 NOTE — Telephone Encounter (Signed)
Received fax saying that Omega-3 isn't on formulary list for pt. I completed a PA at [email or web address] and I will await a response

## 2018-04-22 NOTE — Telephone Encounter (Signed)
Lovaza was denied, letter placed in Dr. Marliss Coots inbox for review

## 2018-04-22 NOTE — Telephone Encounter (Signed)
Let her know that it is not covered.  Triglycerides have to be much higher for her ins to cover this.  She can take otc omega 3 fish oil if she wants to   Thanks

## 2018-04-22 NOTE — Telephone Encounter (Signed)
[name] (daughter) notified of Dr. Marliss Coots instructions and verbalized understanding she will get OTC omega 3 for pt

## 2018-05-03 ENCOUNTER — Encounter: Payer: Self-pay | Admitting: Family Medicine

## 2018-07-26 ENCOUNTER — Ambulatory Visit (INDEPENDENT_AMBULATORY_CARE_PROVIDER_SITE_OTHER): Payer: Medicare Other

## 2018-07-26 ENCOUNTER — Other Ambulatory Visit: Payer: Self-pay

## 2018-07-26 DIAGNOSIS — E559 Vitamin D deficiency, unspecified: Secondary | ICD-10-CM

## 2018-07-26 DIAGNOSIS — Z0000 Encounter for general adult medical examination without abnormal findings: Secondary | ICD-10-CM

## 2018-07-26 DIAGNOSIS — I10 Essential (primary) hypertension: Secondary | ICD-10-CM | POA: Diagnosis not present

## 2018-07-26 DIAGNOSIS — E785 Hyperlipidemia, unspecified: Secondary | ICD-10-CM

## 2018-07-26 DIAGNOSIS — E119 Type 2 diabetes mellitus without complications: Secondary | ICD-10-CM | POA: Diagnosis not present

## 2018-07-26 NOTE — Progress Notes (Signed)
PCP notes:   Health maintenance:  Eye exam - future appt scheduled Tetanus vaccine - postponed/insurance A1C - order generated  Abnormal screenings:   Fall risk - hx of single fall Fall Risk  07/26/2018 07/20/2017 08/18/2016 07/14/2016 04/16/2015  Falls in the past year? 1 Yes Yes No Yes  Comment fell after sliding off edge of bed multiple falls (6) due to losing balance and vertigo; consulted with ENT; no falls since treatment Emmi Telephone Survey: data to providers prior to load - lost balance while trying to pick up cane  Number falls in past yr: 0 2 or more 2 or more - 1  Comment - - Emmi Telephone Survey Actual Response = 2 - -  Injury with Fall? 0 Yes No - No  Risk Factor Category  - High Fall Risk - - -  Risk for fall due to : - Impaired balance/gait - - Impaired balance/gait;Impaired mobility;Impaired vision;History of fall(s)  Risk for fall due to: Comment - - - - -  Follow up - - - - Falls evaluation completed    Patient concerns:   None  Nurse concerns:  None  Next PCP appt:   07/27/18 @ 1545  I reviewed health advisor's note, was available for consultation, and agree with documentation and plan. Loura Pardon MD

## 2018-07-26 NOTE — Progress Notes (Signed)
Subjective:   Lydia King is a 85 y.o. female who presents for Medicare Annual (Subsequent) preventive examination.  Review of Systems:  N/A Cardiac Risk Factors include: advanced age (>4men, >66 women);obesity (BMI >30kg/m2);diabetes mellitus;dyslipidemia;hypertension     Objective:     Vitals: There were no vitals taken for this visit.  There is no height or weight on file to calculate BMI.  Advanced Directives 07/26/2018 12/12/2017 08/26/2017 07/20/2017 02/22/2017 12/28/2016 10/20/2016  Does Patient Have a Medical Advance Directive? Yes Yes No Yes Yes Yes No  Type of Paramedic of [place];Living will Healthcare Power of [place];Living will [place];Living will Out of facility DNR (pink MOST or yellow form);Living will;Healthcare Power of Attorney -  Does patient want to make changes to medical advance directive? No - Patient declined - - - - - -  Copy of [place] in Chart? No - copy requested - - Yes No - copy requested No - copy requested -  Would patient like information on creating a medical advance directive? - - - - - No - Patient declined -    Tobacco Social History   Tobacco Use  Smoking Status Never Smoker  Smokeless Tobacco Never Used     Counseling given: No   Clinical Intake:  Pre-visit preparation completed: Yes  Pain Score: 4 (back)     Nutritional Status: BMI 25 -29 Overweight Nutritional Risks: None  How often do you need to have someone help you when you read instructions, pamphlets, or other written materials from your doctor or pharmacy?: 1 - Never What is the last grade level you completed in school?: 12th grade  Interpreter Needed?: No  Comments: pt is a widow and lives alone Information entered by :: Risk analyst, Therapist, sports  Past Medical History:  Diagnosis Date   Anemia    in past (from blood loss at hiatal hernia)   Asthma    Charcot-Marie-Tooth  disease    walks with cane (fairly controlled)   Diverticulosis    DVT (deep venous thrombosis) ([place]) 2003   after sx   GERD (gastroesophageal reflux disease)    Grief reaction    on fluoxetine   Headache(784.0)    migraine   Hiatal hernia    Hyperlipidemia    Hypertension    OA (ocular albinism) ([place])    Osteopenia    PUD (peptic ulcer disease)    gastric, past   Shingles    Stroke ([place]) 8/10   posterior reversible encephalopathy syndrome   Urine incontinence    Past Surgical History:  Procedure Laterality Date   carotid dopplers  8/10   COLONOSCOPY  2002   HERNIA REPAIR  2002   bleeding   HIATAL HERNIA REPAIR  2000   needed blood transfusion   Stroke  8/10   hosp stroke/ posterior (vs posterior reversible encephalopathy syndrome) - d/c on Plavix   TEE normal  6/96   no emoblic source   TOTAL KNEE ARTHROPLASTY  03,06   Bilateral   VESICOVAGINAL FISTULA CLOSURE W/ TAH  1964   with bleeding and cervical dysplasia   Family History  Problem Relation Age of Onset   Cerebral aneurysm Son    Cerebral aneurysm Father    Hyperlipidemia Father    Alcohol abuse Father    Stroke Mother    Cervical cancer Sister    Hyperlipidemia Brother    Alcohol abuse Brother    Stroke Brother  Diabetes Brother    Diabetes Sister    Sarcoidosis Sister    Other Sister        OP/ Broken hip   Other Other        whole family has charcot marie tooth   Ovarian cancer Sister    Social History   Socioeconomic History   Marital status: Widowed    Spouse name: Not on file   Number of children: Not on file   Years of education: Not on file   Highest education level: Not on file  Occupational History   Not on file  Social Needs   Financial resource strain: Not on file   Food insecurity    Worry: Not on file    Inability: Not on file   Transportation needs    Medical: Not on file    Non-medical: Not on file  Tobacco Use   Smoking  status: Never Smoker   Smokeless tobacco: Never Used  Substance and Sexual Activity   Alcohol use: No   Drug use: No   Sexual activity: Not Currently  Lifestyle   Physical activity    Days per week: Not on file    Minutes per session: Not on file   Stress: Not on file  Relationships   Social connections    Talks on phone: Not on file    Gets together: Not on file    Attends religious service: Not on file    Active member of club or organization: Not on file    Attends meetings of clubs or organizations: Not on file    Relationship status: Not on file  Other Topics Concern   Not on file  Social History Narrative   G6P4   Son died 2 years ago cerebral anerysm    Outpatient Encounter Medications as of 07/26/2018  Medication Sig   acetaminophen (TYLENOL) 650 MG CR tablet Take 650 mg by mouth every 8 (eight) hours as needed for pain.   azelastine (ASTELIN) 0.1 % nasal spray Place 1 spray into both nostrils 2 (two) times daily. Use in each nostril as directed   Blood Glucose Monitoring Suppl (FREESTYLE LITE) DEVI Use to check blood glucose daily and as needed for diabetes   cetirizine (ZYRTEC) 10 MG tablet Take 1 tablet (10 mg total) by mouth daily.   Cholecalciferol (VITAMIN D) 2000 UNITS CAPS Take 2 capsules by mouth daily.     clopidogrel (PLAVIX) 75 MG tablet Take 1 tablet (75 mg total) by mouth daily.   esomeprazole (NEXIUM) 40 MG capsule Take 1 capsule (40 mg total) by mouth daily before breakfast.   Flaxseed, Linseed, (FLAX SEED OIL) 1000 MG CAPS Take 1 capsule by mouth daily.     FLUoxetine (PROZAC) 20 MG capsule Take 1 capsule (20 mg total) by mouth daily.   furosemide (LASIX) 20 MG tablet Take 2 tablets (40 mg total) by mouth daily.   glucose blood (FREESTYLE LITE) test strip Use to check blood sugar once daily for DM (dx. E11.618)   Lancets (FREESTYLE) lancets Use as instructed   loratadine (CLARITIN) 10 MG tablet Take 1 tablet (10 mg total) by mouth  daily.   losartan (COZAAR) 100 MG tablet TAKE 1/2 TAB BY MOUTH ONCE A DAY   metFORMIN (GLUCOPHAGE) 1000 MG tablet Take 1 tablet (1,000 mg total) by mouth 2 (two) times daily with a meal.   Misc. Devices (WALKER) MISC Wheeled walker with seat- Pt must use for ambulation-- for use as directed: ambulation  dx: 729.5, 780.79, 786.05, 784.0, 715.89.    omega-3 acid ethyl esters (LOVAZA) 1 g capsule Take 1 capsule (1 g total) by mouth daily.   PROAIR HFA 108 (90 Base) MCG/ACT inhaler INHALE 2 PUFFS INTO THE LUNGS EVERY 4 (FOUR) HOURS AS NEEDED FOR WHEEZING OR SHORTNESS OF BREATH.   rosuvastatin (CRESTOR) 10 MG tablet Take 1 tablet (10 mg total) by mouth at bedtime.   No facility-administered encounter medications on file as of 07/26/2018.     Activities of Daily Living In your present state of health, do you have any difficulty performing the following activities: 07/26/2018  Hearing? N  Vision? N  Difficulty concentrating or making decisions? N  Walking or climbing stairs? Y  Dressing or bathing? N  Doing errands, shopping? Y  Preparing Food and eating ? N  Using the Toilet? N  In the past six months, have you accidently leaked urine? Y  Do you have problems with loss of bowel control? N  Managing your Medications? N  Managing your Finances? N  Housekeeping or managing your Housekeeping? N  Some recent data might be hidden    Patient Care Team: Tower, Wynelle Fanny, MD as PCP - General    Assessment:   This is a routine wellness examination for Rodolfo.   Hearing Screening   125Hz  250Hz  500Hz  1000Hz  2000Hz  3000Hz  4000Hz  6000Hz  8000Hz   Right ear:           Left ear:           Vision Screening Comments: Vision exam in 2019 with Dr. Jone Baseman   Exercise Activities and Dietary recommendations Current Exercise Habits: Home exercise routine, Type of exercise: walking;stretching, Time (Minutes): 30, Frequency (Times/Week): 7, Weekly Exercise (Minutes/Week): 210, Intensity: Mild  Goals      Increase physical activity     Starting 07/26/18, I will continue to exercise for 30 minutes daily.       Fall Risk Fall Risk  07/26/2018 07/20/2017 08/18/2016 07/14/2016 04/16/2015  Falls in the past year? 1 Yes Yes No Yes  Comment fell after sliding off edge of bed multiple falls (6) due to losing balance and vertigo; consulted with ENT; no falls since treatment Emmi Telephone Survey: data to providers prior to load - lost balance while trying to pick up cane  Number falls in past yr: 0 2 or more 2 or more - 1  Comment - - Emmi Telephone Survey Actual Response = 2 - -  Injury with Fall? 0 Yes No - No  Risk Factor Category  - High Fall Risk - - -  Risk for fall due to : - Impaired balance/gait - - Impaired balance/gait;Impaired mobility;Impaired vision;History of fall(s)  Risk for fall due to: Comment - - - - -  Follow up - - - - Falls evaluation completed   Depression Screen PHQ 2/9 Scores 07/26/2018 07/20/2017 07/14/2016 04/16/2015  PHQ - 2 Score 0 0 0 0  PHQ- 9 Score 0 0 - -     Cognitive Function MMSE - Mini Mental State Exam 07/26/2018 07/20/2017 07/14/2016 04/16/2015  Orientation to time 5 5 5 5   Orientation to Place 5 5 5 5   Registration 3 3 3 3   Attention/ Calculation 0 0 0 3  Recall 3 3 3 3   Language- name 2 objects 0 0 0 0  Language- repeat 1 1 1 1   Language- follow 3 step command 0 3 3 3   Language- read & follow direction 0 0 0 0  Write a sentence 0 0 0 0  Copy design 0 0 0 0  Total score 17 20 20 23      PLEASE NOTE: A Mini-Cog screen was completed. Maximum score is 17. A value of 0 denotes this part of [place] MMSE was not completed or the patient failed this part of the Mini-Cog screening.   Mini-Cog Screening Orientation to Time - Max 5 pts Orientation to Place - Max 5 pts Registration - Max 3 pts Recall - Max 3 pts Language Repeat - Max 1 pts    Immunization History  Administered Date(s) Administered   Influenza Split 11/22/2010, 11/18/2011    Influenza-Unspecified 10/27/2012, 10/27/2013   Pneumococcal Conjugate-13 10/04/2013   Pneumococcal Polysaccharide-23 01/27/2005   Td 05/26/2007    Screening Tests Health Maintenance  Topic Date Due   OPHTHALMOLOGY EXAM  01/27/2019 (Originally 04/01/2018)   TETANUS/TDAP  01/27/2020 (Originally 05/25/2017)   DEXA SCAN  07/14/2048 (Originally 08/19/1997)   HEMOGLOBIN A1C  08/06/2018   FOOT EXAM  02/06/2019   PNA vac Low Risk Adult  Completed       Plan:     I have personally reviewed, addressed, and noted the following in the patients chart:  A. Medical and social history B. Use of alcohol, tobacco or illicit drugs  C. Current medications and supplements D. Functional ability and status E.  Nutritional status F.  Physical activity G. Advance directives H. List of other physicians I.  Hospitalizations, surgeries, and ER visits in previous 12 months J.  Vitals (unless it is a telemedicine encounter) K. Screenings to include cognitive, depression, hearing, vision (NOTE: hearing and vision screenings not completed in telemedicine encounter) L. Referrals and appointments   In addition, I have reviewed and discussed with patient certain preventive protocols, quality metrics, and best practice recommendations. A written personalized care plan for preventive services and recommendations were provided to patient.  With patient's permission, we connected on 07/26/18 at  4:00 PM EDT. Interactive audio and video telecommunications were attempted with patient. This attempt was unsuccessful due to patient having technical difficulties OR patient did not have access to video capability.  Encounter was completed with audio only.  Two patient identifiers were used to ensure the encounter occurred with the correct person. Patient was in home and writer was in office.     Signed,   Lindell Noe, MHA, BS, RN Health Coach

## 2018-07-26 NOTE — Patient Instructions (Signed)
Lydia King , Thank you for taking time to come for your Medicare Wellness Visit. I appreciate your ongoing commitment to your health goals. Please review the following plan we discussed and let me know if I can assist you in the future.   These are the goals we discussed: Goals    . Increase physical activity     Starting 07/26/18, I will continue to exercise for 30 minutes daily.       This is a list of the screening recommended for you and due dates:  Health Maintenance  Topic Date Due  . Eye exam for diabetics  01/27/2019*  . Tetanus Vaccine  01/27/2020*  . DEXA scan (bone density measurement)  07/14/2048*  . Hemoglobin A1C  08/06/2018  . Complete foot exam   02/06/2019  . Pneumonia vaccines  Completed  *Topic was postponed. The date shown is not the original due date.   Preventive Care for Adults  A healthy lifestyle and preventive care can promote health and wellness. Preventive health guidelines for adults include the following key practices.  . A routine yearly physical is a good way to check with your health care provider about your health and preventive screening. It is a chance to share any concerns and updates on your health and to receive a thorough exam.  . Visit your dentist for a routine exam and preventive care every 6 months. Brush your teeth twice a day and floss once a day. Good oral hygiene prevents tooth decay and gum disease.  . The frequency of eye exams is based on your age, health, family medical history, use  of contact lenses, and other factors. Follow your health care provider's recommendations for frequency of eye exams.  . Eat a healthy diet. Foods like vegetables, fruits, whole grains, low-fat dairy products, and lean protein foods contain the nutrients you need without too many calories. Decrease your intake of foods high in solid fats, added sugars, and salt. Eat the right amount of calories for you. Get information about a proper diet from your  health care provider, if necessary.  . Regular physical exercise is one of the most important things you can do for your health. Most adults should get at least 150 minutes of moderate-intensity exercise (any activity that increases your heart rate and causes you to sweat) each week. In addition, most adults need muscle-strengthening exercises on 2 or more days a week.  Silver Sneakers may be a benefit available to you. To determine eligibility, you may visit the website: www.silversneakers.com or contact program at (431) 357-6873 Mon-Fri between 8AM-8PM.   . Maintain a healthy weight. The body mass index (BMI) is a screening tool to identify possible weight problems. It provides an estimate of body fat based on height and weight. Your health care provider can find your BMI and can help you achieve or maintain a healthy weight.   For adults 20 years and older: ? A BMI below 18.5 is considered underweight. ? A BMI of 18.5 to 24.9 is normal. ? A BMI of 25 to 29.9 is considered overweight. ? A BMI of 30 and above is considered obese.   . Maintain normal blood lipids and cholesterol levels by exercising and minimizing your intake of saturated fat. Eat a balanced diet with plenty of fruit and vegetables. Blood tests for lipids and cholesterol should begin at age 31 and be repeated every 5 years. If your lipid or cholesterol levels are high, you are over 50, or you are  at high risk for heart disease, you may need your cholesterol levels checked more frequently. Ongoing high lipid and cholesterol levels should be treated with medicines if diet and exercise are not working.  . If you smoke, find out from your health care provider how to quit. If you do not use tobacco, please do not start.  . If you choose to drink alcohol, please do not consume more than 2 drinks per day. One drink is considered to be 12 ounces (355 mL) of beer, 5 ounces (148 mL) of wine, or 1.5 ounces (44 mL) of liquor.  . If you are  71-63 years old, ask your health care provider if you should take aspirin to prevent strokes.  . Use sunscreen. Apply sunscreen liberally and repeatedly throughout the day. You should seek shade when your shadow is shorter than you. Protect yourself by wearing long sleeves, pants, a wide-brimmed hat, and sunglasses year round, whenever you are outdoors.  . Once a month, do a whole body skin exam, using a mirror to look at the skin on your back. Tell your health care provider of new moles, moles that have irregular borders, moles that are larger than a pencil eraser, or moles that have changed in shape or color.

## 2018-07-27 ENCOUNTER — Ambulatory Visit (INDEPENDENT_AMBULATORY_CARE_PROVIDER_SITE_OTHER): Payer: Medicare Other | Admitting: Family Medicine

## 2018-07-27 ENCOUNTER — Other Ambulatory Visit: Payer: Self-pay

## 2018-07-27 ENCOUNTER — Encounter: Payer: Self-pay | Admitting: Family Medicine

## 2018-07-27 ENCOUNTER — Other Ambulatory Visit (INDEPENDENT_AMBULATORY_CARE_PROVIDER_SITE_OTHER): Payer: Medicare Other

## 2018-07-27 VITALS — BP 132/72 | Wt 162.0 lb

## 2018-07-27 DIAGNOSIS — E559 Vitamin D deficiency, unspecified: Secondary | ICD-10-CM

## 2018-07-27 DIAGNOSIS — E1169 Type 2 diabetes mellitus with other specified complication: Secondary | ICD-10-CM | POA: Diagnosis not present

## 2018-07-27 DIAGNOSIS — E119 Type 2 diabetes mellitus without complications: Secondary | ICD-10-CM

## 2018-07-27 DIAGNOSIS — I10 Essential (primary) hypertension: Secondary | ICD-10-CM | POA: Diagnosis not present

## 2018-07-27 DIAGNOSIS — E785 Hyperlipidemia, unspecified: Secondary | ICD-10-CM | POA: Diagnosis not present

## 2018-07-27 DIAGNOSIS — R42 Dizziness and giddiness: Secondary | ICD-10-CM | POA: Diagnosis not present

## 2018-07-27 DIAGNOSIS — E11618 Type 2 diabetes mellitus with other diabetic arthropathy: Secondary | ICD-10-CM | POA: Diagnosis not present

## 2018-07-27 DIAGNOSIS — M8580 Other specified disorders of bone density and structure, unspecified site: Secondary | ICD-10-CM

## 2018-07-27 LAB — COMPREHENSIVE METABOLIC PANEL
ALT: 8 U/L (ref 0–35)
AST: 8 U/L (ref 0–37)
Albumin: 4 g/dL (ref 3.5–5.2)
Alkaline Phosphatase: 62 U/L (ref 39–117)
BUN: 23 mg/dL (ref 6–23)
CO2: 31 mEq/L (ref 19–32)
Calcium: 9.5 mg/dL (ref 8.4–10.5)
Chloride: 102 mEq/L (ref 96–112)
Creatinine, Ser: 0.74 mg/dL (ref 0.40–1.20)
GFR: 74.42 mL/min (ref >60.00)
Glucose, Bld: 113 mg/dL — ABNORMAL HIGH (ref 70–99)
Potassium: 4.6 mEq/L (ref 3.5–5.1)
Sodium: 141 mEq/L (ref 135–145)
Total Bilirubin: 0.4 mg/dL (ref 0.2–1.2)
Total Protein: 6.1 g/dL (ref 6.0–8.3)

## 2018-07-27 LAB — CBC WITH DIFFERENTIAL/PLATELET
Basophils Absolute: 0 10*3/uL (ref 0.0–0.1)
Basophils Relative: 0.4 % (ref 0.0–3.0)
Eosinophils Absolute: 0.1 10*3/uL (ref 0.0–0.7)
Eosinophils Relative: 2.5 % (ref 0.0–5.0)
HCT: 37.7 % (ref 36.0–46.0)
Hemoglobin: 12.5 g/dL (ref 12.0–15.0)
Lymphocytes Relative: 37.3 % (ref 12.0–46.0)
Lymphs Abs: 1.8 10*3/uL (ref 0.7–4.0)
MCHC: 33.1 g/dL (ref 30.0–36.0)
MCV: 92.9 fl (ref 78.0–100.0)
Monocytes Absolute: 0.3 10*3/uL (ref 0.1–1.0)
Monocytes Relative: 6.2 % (ref 3.0–12.0)
Neutro Abs: 2.6 10*3/uL (ref 1.4–7.7)
Neutrophils Relative %: 53.6 % (ref 43.0–77.0)
Platelets: 145 10*3/uL — ABNORMAL LOW (ref 150.0–400.0)
RBC: 4.06 Mil/uL (ref 3.87–5.11)
RDW: 13.3 % (ref 11.5–15.5)
WBC: 4.8 10*3/uL (ref 4.0–10.5)

## 2018-07-27 LAB — HEMOGLOBIN A1C: Hgb A1c MFr Bld: 7.1 % — ABNORMAL HIGH (ref 4.6–6.5)

## 2018-07-27 LAB — LIPID PANEL
Cholesterol: 156 mg/dL (ref 0–200)
HDL: 60.5 mg/dL (ref >39.00)
LDL Cholesterol: 75 mg/dL (ref 0–99)
NonHDL: 95.22
Total CHOL/HDL Ratio: 3
Triglycerides: 103 mg/dL (ref 0.0–149.0)
VLDL: 20.6 mg/dL (ref 0.0–40.0)

## 2018-07-27 LAB — TSH: TSH: 2.64 u[IU]/mL (ref 0.35–4.50)

## 2018-07-27 LAB — VITAMIN D 25 HYDROXY (VIT D DEFICIENCY, FRACTURES): VITD: 81.01 ng/mL (ref 30.00–100.00)

## 2018-07-27 MED ORDER — FUROSEMIDE 20 MG PO TABS: 40.0000 mg | ORAL_TABLET | Freq: Every day | ORAL | 3 refills | Status: DC

## 2018-07-27 MED ORDER — ESOMEPRAZOLE MAGNESIUM 40 MG PO CPDR: 40.0000 mg | DELAYED_RELEASE_CAPSULE | Freq: Every day | ORAL | 3 refills | Status: DC

## 2018-07-27 MED ORDER — METFORMIN HCL 1000 MG PO TABS: 1000.0000 mg | ORAL_TABLET | Freq: Two times a day (BID) | ORAL | 3 refills | Status: DC

## 2018-07-27 MED ORDER — LOSARTAN POTASSIUM 100 MG PO TABS: ORAL_TABLET | ORAL | 3 refills | Status: DC

## 2018-07-27 MED ORDER — CLOPIDOGREL BISULFATE 75 MG PO TABS: 75.0000 mg | ORAL_TABLET | Freq: Every day | ORAL | 3 refills | Status: DC

## 2018-07-27 MED ORDER — ROSUVASTATIN CALCIUM 10 MG PO TABS: 10.0000 mg | ORAL_TABLET | Freq: Every day | ORAL | 3 refills | Status: DC

## 2018-07-27 MED ORDER — OMEGA-3-ACID ETHYL ESTERS 1 G PO CAPS: 1.0000 g | ORAL_CAPSULE | Freq: Every day | ORAL | 3 refills | Status: DC

## 2018-07-27 MED ORDER — FLUOXETINE HCL 20 MG PO CAPS: 20.0000 mg | ORAL_CAPSULE | Freq: Every day | ORAL | 3 refills | Status: DC

## 2018-07-27 NOTE — Patient Instructions (Addendum)
Try to keep track of what you eat and check more blood glucose level 2 hours after a meal in afternoon or evening  Try to get most of your carbohydrates from produce (with the exception of white potatoes)  Eat less bread/pasta/rice/snack foods/cereals/sweets and other items from the middle of the grocery store (processed carbs)  Stay as active as you can be The office will call you to set up a follow up in 6 months We will also mail a lab report to you

## 2018-07-27 NOTE — Progress Notes (Signed)
Virtual Visit via Telephone Note  I connected with Lydia King on 07/27/18 at  3:45 PM EDT by telephone and verified that I am speaking with the correct person using two identifiers.  Location: Patient: home Provider: office    I discussed the limitations, risks, security and privacy concerns of performing an evaluation and management service by telephone and the availability of in person appointments. I also discussed with the patient that there may be a patient responsible charge related to this service. The patient expressed understanding and agreed to proceed.   History of Present Illness: Pt presents for annual f/u of chronic health problems   She is staying home and safe   Weight  Wt Readings from Last 3 Encounters:  02/05/18 166 lb 4 oz (75.4 kg)  12/12/17 163 lb (73.9 kg)  11/26/17 166 lb 12 oz (75.6 kg)  162 lb today  Not eating out a lot    amw was yesterday Noted one fall from sliding off edge of bed this year   Eye exam- has an appt for November  Does not notice a change in her vision   dexa -declines H/o osteopenia  Last D level in 70s This time 81.0  No fractures    She does not get mammograms at her age No lumps on self exam    HTN No cp or palpitations or headaches or edema  No side effects to medicines  No episodes of high or low  BP Readings from Last 3 Encounters:  02/05/18 130/78  12/12/17 (!) 152/60  11/26/17 128/72     132/72 today   Lab Results  Component Value Date   CREATININE 0.74 07/27/2018   BUN 23 07/27/2018   NA 141 07/27/2018   K 4.6 07/27/2018   CL 102 07/27/2018   CO2 31 07/27/2018   Lab Results  Component Value Date   ALT 8 07/27/2018   AST 8 07/27/2018   ALKPHOS 62 07/27/2018   BILITOT 0.4 07/27/2018   Lab Results  Component Value Date   TSH 2.64 07/27/2018      DM2 Lab Results  Component Value Date   HGBA1C 7.1 (H) 07/27/2018    due for labs  Glucose runs in 120s first thing in the am/ she does not  check in the afternoon  She is avoiding sweets / pref fruits  ARB for renal protection  Rosuvastatin for cholesterol   Is limited in terms of exercise  She can sit in a chair and move legs  Has chronic pain  Dizziness is much better after PT in march   Lab Results  Component Value Date   WBC 4.8 07/27/2018   HGB 12.5 07/27/2018   HCT 37.7 07/27/2018   MCV 92.9 07/27/2018   PLT 145.0 (L) 07/27/2018      Hyperlipidemia Lab Results  Component Value Date   CHOL 156 07/27/2018   CHOL 194 07/20/2017   CHOL 169 07/14/2016   Lab Results  Component Value Date   HDL 60.50 07/27/2018   HDL 59.60 07/20/2017   HDL 53.20 07/14/2016   Lab Results  Component Value Date   LDLCALC 75 07/27/2018   LDLCALC 107 (H) 07/20/2017   LDLCALC 80 04/16/2015   Lab Results  Component Value Date   TRIG 103.0 07/27/2018   TRIG 137.0 07/20/2017   TRIG 222.0 (H) 07/14/2016   Lab Results  Component Value Date   CHOLHDL 3 07/27/2018   CHOLHDL 3 07/20/2017   CHOLHDL 3 07/14/2016  Lab Results  Component Value Date   LDLDIRECT 78.0 07/14/2016   LDLDIRECT 237.4 07/14/2008   LDLDIRECT 237.6 11/01/2007    Due for labs On crestor - ran out of recently - 2nd week  No side effects from it at all  She avoids fried foods and bacon and sausage   Takes fluoxetine for her mood Very stable /mood is good  She has not noticed anxiety or depression during the pandemic -is comfortable staying at home   Patient Active Problem List   Diagnosis Date Noted  . Sinus congestion 02/05/2018  . Vertigo 05/06/2017  . OSA (obstructive sleep apnea) 01/02/2016  . Pedal edema 01/02/2016  . Fall in home 12/11/2015  . Poor balance 12/11/2015  . Low back pain with sciatica 10/02/2015  . Right hip pain 10/02/2015  . Hearing loss 04/18/2015  . Encounter for Medicare annual wellness exam 10/04/2013  . Diabetes mellitus ([place]) 08/31/2012  . CVA 09/12/2008  . OSTEOARTHRITIS, MULTIPLE JOINTS 05/30/2008  .  Osteopenia 05/30/2008  . Vitamin D deficiency 07/14/2007  . SLEEP APNEA 07/14/2007  . Essential hypertension 11/30/2006  . Asthma 11/30/2006  . H/O gastroesophageal reflux (GERD) 11/30/2006  . G I BLEED 11/30/2006  . DEEP VENOUS THROMBOPHLEBITIS, HX OF 11/30/2006   Past Medical History:  Diagnosis Date  . Anemia    in past (from blood loss at hiatal hernia)  . Asthma   . Charcot-Marie-Tooth disease    walks with cane (fairly controlled)  . Diverticulosis   . DVT (deep venous thrombosis) ([place]) 2003   after sx  . GERD (gastroesophageal reflux disease)   . Grief reaction    on fluoxetine  . Headache(784.0)    migraine  . Hiatal hernia   . Hyperlipidemia   . Hypertension   . OA (ocular albinism) ([place])   . Osteopenia   . PUD (peptic ulcer disease)    gastric, past  . Shingles   . Stroke ([place]) 8/10   posterior reversible encephalopathy syndrome  . Urine incontinence    Past Surgical History:  Procedure Laterality Date  . carotid dopplers  8/10  . COLONOSCOPY  2002  . HERNIA REPAIR  2002   bleeding  . HIATAL HERNIA REPAIR  2000   needed blood transfusion  . Stroke  8/10   hosp stroke/ posterior (vs posterior reversible encephalopathy syndrome) - d/c on Plavix  . TEE normal  2/95   no emoblic source  . TOTAL KNEE ARTHROPLASTY  03,06   Bilateral  . VESICOVAGINAL FISTULA CLOSURE W/ TAH  1964   with bleeding and cervical dysplasia   Social History   Tobacco Use  . Smoking status: Never Smoker  . Smokeless tobacco: Never Used  Substance Use Topics  . Alcohol use: No  . Drug use: No   Family History  Problem Relation Age of Onset  . Cerebral aneurysm Son   . Cerebral aneurysm Father   . Hyperlipidemia Father   . Alcohol abuse Father   . Stroke Mother   . Cervical cancer Sister   . Hyperlipidemia Brother   . Alcohol abuse Brother   . Stroke Brother   . Diabetes Brother   . Diabetes Sister   . Sarcoidosis Sister   . Other Sister        OP/ Broken hip  .  Other Other        whole family has charcot marie tooth  . Ovarian cancer Sister    Allergies  Allergen Reactions  . Shrimp [  Shellfish Allergy] Anaphylaxis    Anaphylaxis-swelling of throat/sob    . Amlodipine Besylate     REACTION: edema  . Atorvastatin     REACTION: pain- could not walk  . Ciprofloxacin     REACTION: unkown  . Codeine     REACTION: itchy rash  . Influenza Vaccines Other (See Comments)    Arm swelling, redness, chills, fever  . Nitrofurantoin   . Omeprazole     REACTION: not effective  . Pantoprazole Sodium     REACTION: does not work well  . Penicillins     REACTION: itchy rash  . Prednisone     REACTION: RASH.// SHORTNESS OF BREATH  . Simvastatin     REACTION: leg pain  . Sulfonamide Derivatives     REACTION: itchy rash   Current Outpatient Medications on File Prior to Visit  Medication Sig Dispense Refill  . acetaminophen (TYLENOL) 650 MG CR tablet Take 650 mg by mouth every 8 (eight) hours as needed for pain.    Marland Kitchen azelastine (ASTELIN) 0.1 % nasal spray Place 1 spray into both nostrils 2 (two) times daily. Use in each nostril as directed 30 mL 0  . Blood Glucose Monitoring Suppl (FREESTYLE LITE) DEVI Use to check blood glucose daily and as needed for diabetes 1 each 0  . cetirizine (ZYRTEC) 10 MG tablet Take 1 tablet (10 mg total) by mouth daily. 30 tablet 0  . Cholecalciferol (VITAMIN D) 2000 UNITS CAPS Take 2 capsules by mouth daily.      . Flaxseed, Linseed, (FLAX SEED OIL) 1000 MG CAPS Take 1 capsule by mouth daily.      Marland Kitchen glucose blood (FREESTYLE LITE) test strip Use to check blood sugar once daily for DM (dx. E11.618) 100 each 3  . Lancets (FREESTYLE) lancets Use as instructed 100 each 3  . loratadine (CLARITIN) 10 MG tablet Take 1 tablet (10 mg total) by mouth daily. 90 tablet 3  . Misc. Devices (WALKER) MISC Wheeled walker with seat- Pt must use for ambulation-- for use as directed: ambulation dx: 729.5, 780.79, 786.05, 784.0, 715.89.     Marland Kitchen  PROAIR HFA 108 (90 Base) MCG/ACT inhaler INHALE 2 PUFFS INTO THE LUNGS EVERY 4 (FOUR) HOURS AS NEEDED FOR WHEEZING OR SHORTNESS OF BREATH. 8.5 g 3   No current facility-administered medications on file prior to visit.    Review of Systems  Constitutional: Negative for chills, fever, malaise/fatigue and weight loss.  HENT: Positive for hearing loss. Negative for sore throat.   Eyes: Negative for blurred vision.  Respiratory: Negative for cough and shortness of breath.   Cardiovascular: Negative for chest pain, palpitations and leg swelling.       Less exercise tolerance since she is sedentary  Gastrointestinal: Negative for abdominal pain and heartburn.  Genitourinary: Negative for dysuria.  Musculoskeletal: Positive for back pain and joint pain. Negative for myalgias.  Skin: Negative for rash.  Neurological: Negative for dizziness and headaches.  Endo/Heme/Allergies: Negative for polydipsia.  Psychiatric/Behavioral: Negative for depression and memory loss.      Observations/Objective: Pt sounds like her usual self Not distressed Nl cognition and good mood  Able to hear call well  Not hoarse No sob or cough during interview  Cheerful and talkative   Assessment and Plan: Problem List Items Addressed This Visit      Cardiovascular and Mediastinum   Essential hypertension    bp in fair control at this time  BP Readings from Last 1 Encounters:  07/27/18 132/72   No changes needed Most recent labs reviewed  Disc lifstyle change with low sodium diet and exercise        Relevant Medications   rosuvastatin (CRESTOR) 10 MG tablet   omega-3 acid ethyl esters (LOVAZA) 1 g capsule   losartan (COZAAR) 100 MG tablet   furosemide (LASIX) 20 MG tablet   Other Relevant Orders   Comprehensive metabolic panel     Endocrine   Diabetes mellitus ([place]) - Primary    Lab Results  Component Value Date   HGBA1C 7.1 (H) 07/27/2018   This is up  Inst pt to start checking some glucose  levels in afternoon or evening Watch carb intake Stay as active as possible F/u 6 mo      Relevant Medications   rosuvastatin (CRESTOR) 10 MG tablet   metFORMIN (GLUCOPHAGE) 1000 MG tablet   losartan (COZAAR) 100 MG tablet   Other Relevant Orders   Hemoglobin A1c   Lipid panel   Hyperlipidemia associated with type 2 diabetes mellitus ([place])    Fairly controlled but ran out of crestor Disc goals for lipids and reasons to control them Rev last labs with pt Rev low sat fat diet in detail This was refilled        Relevant Medications   rosuvastatin (CRESTOR) 10 MG tablet   omega-3 acid ethyl esters (LOVAZA) 1 g capsule   metFORMIN (GLUCOPHAGE) 1000 MG tablet   losartan (COZAAR) 100 MG tablet     Musculoskeletal and Integument   Osteopenia    Declines further eval with dexa One fall No fx  D level is great in the 80s Continue supplements  Activity as tolerated Fall prevention         Other   Vitamin D deficiency    Level in 50s Vitamin D level is therapeutic with current supplementation Disc importance of this to bone and overall health       Vertigo    Much improved after PT in march            Follow Up Instructions: Try to keep track of what you eat and check more blood glucose level 2 hours after a meal in afternoon or evening  Try to get most of your carbohydrates from produce (with the exception of white potatoes)  Eat less bread/pasta/rice/snack foods/cereals/sweets and other items from the middle of the grocery store (processed carbs)  Stay as active as you can be The office will call you to set up a follow up in 6 months We will also mail a lab report to you    I discussed the assessment and treatment plan with the patient. The patient was provided an opportunity to ask questions and all were answered. The patient agreed with the plan and demonstrated an understanding of the instructions.   The patient was advised to call back or seek an in-person  evaluation if the symptoms worsen or if the condition fails to improve as anticipated.  I provided 25 minutes of non-face-to-face time during this encounter.   Loura Pardon, MD

## 2018-07-27 NOTE — Assessment & Plan Note (Signed)
Fairly controlled but ran out of crestor Disc goals for lipids and reasons to control them Rev last labs with pt Rev low sat fat diet in detail This was refilled

## 2018-07-27 NOTE — Assessment & Plan Note (Signed)
bp in fair control at this time  BP Readings from Last 1 Encounters:  07/27/18 132/72   No changes needed Most recent labs reviewed  Disc lifstyle change with low sodium diet and exercise

## 2018-07-27 NOTE — Assessment & Plan Note (Signed)
Level in 65s Vitamin D level is therapeutic with current supplementation Disc importance of this to bone and overall health

## 2018-07-27 NOTE — Assessment & Plan Note (Signed)
Lab Results  Component Value Date   HGBA1C 7.1 (H) 07/27/2018   This is up  Inst pt to start checking some glucose levels in afternoon or evening Watch carb intake Stay as active as possible F/u 6 mo

## 2018-07-27 NOTE — Assessment & Plan Note (Signed)
Much improved after PT in march

## 2018-07-27 NOTE — Assessment & Plan Note (Signed)
Declines further eval with dexa One fall No fx  D level is great in the 80s Continue supplements  Activity as tolerated Fall prevention

## 2018-07-28 ENCOUNTER — Encounter: Payer: Medicare Other | Admitting: Family Medicine

## 2018-07-29 ENCOUNTER — Telehealth: Payer: Self-pay | Admitting: *Deleted

## 2018-07-29 NOTE — Telephone Encounter (Signed)
PA for pt's Lovaza done at [email or web address], I will await a response

## 2018-08-02 NOTE — Telephone Encounter (Signed)
PA was approved. Pharmacy notified and letter placed in Dr. Tower's inbox to sign and send for scanning  

## 2018-11-08 ENCOUNTER — Other Ambulatory Visit: Payer: Self-pay | Admitting: *Deleted

## 2018-11-08 MED ORDER — FREESTYLE LANCETS MISC: 0 refills | Status: DC

## 2018-11-08 MED ORDER — FREESTYLE LITE TEST VI STRP: ORAL_STRIP | 0 refills | Status: DC

## 2018-11-09 MED ORDER — LORATADINE 10 MG PO TABS: 10.0000 mg | ORAL_TABLET | Freq: Every day | ORAL | 1 refills | Status: DC

## 2018-11-09 NOTE — Addendum Note (Signed)
Addended by: Tammi Sou on: 11/09/2018 12:31 PM   Modules accepted: Orders

## 2018-12-14 DIAGNOSIS — H25013 Cortical age-related cataract, bilateral: Secondary | ICD-10-CM | POA: Diagnosis not present

## 2018-12-14 DIAGNOSIS — E119 Type 2 diabetes mellitus without complications: Secondary | ICD-10-CM | POA: Diagnosis not present

## 2018-12-14 LAB — HM DIABETES EYE EXAM

## 2018-12-21 ENCOUNTER — Encounter: Payer: Self-pay | Admitting: Family Medicine

## 2019-01-06 ENCOUNTER — Telehealth: Payer: Self-pay

## 2019-01-06 DIAGNOSIS — Z20828 Contact with and (suspected) exposure to other viral communicable diseases: Secondary | ICD-10-CM | POA: Diagnosis not present

## 2019-01-06 DIAGNOSIS — J0100 Acute maxillary sinusitis, unspecified: Secondary | ICD-10-CM | POA: Diagnosis not present

## 2019-01-06 NOTE — Telephone Encounter (Signed)
Aware- agree with advisement

## 2019-01-06 NOTE — Telephone Encounter (Signed)
Pt said she has sinus drainage, her face is sore, no fever; pt has dry cough and SOB upon exertion. Pt said she is in no distress now. Pt does not want to go to ED but will go to [facility] when someone can get there to take pt. Pt said if condition changes or worsens will go to ED. FYI to Dr Glori Bickers. Pt does not have cell phone to do virtual visit.

## 2019-01-13 MED ORDER — DOXYCYCLINE HYCLATE 100 MG PO TABS: 100.0000 mg | ORAL_TABLET | Freq: Two times a day (BID) | ORAL | 0 refills | Status: DC

## 2019-01-13 NOTE — Telephone Encounter (Signed)
I sent in another course and please alert me if no further improvement  This usually helps and she has a lot of other drug intolerances so we are limited  If she has any concerns re: covid and wants to be tested please give her the into to schedule appt through [facility]   Watch for fever/ cough/sob

## 2019-01-13 NOTE — Telephone Encounter (Signed)
No new symptoms with not much improvement of headaches and sinus drainage with this round of antibiotics. Ears still "feels squishy".

## 2019-01-13 NOTE — Addendum Note (Signed)
Addended by: Loura Pardon A on: 01/13/2019 12:50 PM   Modules accepted: Orders

## 2019-01-13 NOTE — Telephone Encounter (Signed)
Pt said she went to [facility] in [place] per 01/06/19 phone note and was dx with sinus infection; pt was given doxycycline not sure of mg but qty was for 7 days; also gave pt tessalon perle.pt continues with face soreness, h/a,runny nose and a horrible sinus drainage and pt has no fever and pt request an abx refill of doxycycline because it usually works well for pt.[facility].

## 2019-01-13 NOTE — Telephone Encounter (Signed)
Is she starting to improve on the doxycycline but not totally improved?  Any fever or new symptoms?  Thanks

## 2019-01-14 NOTE — Telephone Encounter (Signed)
Sandi Mariscal, patient's daughter advised.

## 2019-01-17 ENCOUNTER — Emergency Department: Payer: Medicare Other

## 2019-01-17 ENCOUNTER — Emergency Department
Admission: EM | Admit: 2019-01-17 | Discharge: 2019-01-17 | Disposition: A | Discharge status: Home / Self Care | Payer: Medicare Other | Attending: Emergency Medicine | Admitting: Emergency Medicine

## 2019-01-17 ENCOUNTER — Other Ambulatory Visit: Payer: Self-pay

## 2019-01-17 ENCOUNTER — Telehealth: Payer: Self-pay

## 2019-01-17 ENCOUNTER — Encounter: Payer: Self-pay | Admitting: Emergency Medicine

## 2019-01-17 DIAGNOSIS — E119 Type 2 diabetes mellitus without complications: Secondary | ICD-10-CM | POA: Insufficient documentation

## 2019-01-17 DIAGNOSIS — W19XXXA Unspecified fall, initial encounter: Secondary | ICD-10-CM

## 2019-01-17 DIAGNOSIS — I10 Essential (primary) hypertension: Secondary | ICD-10-CM | POA: Diagnosis not present

## 2019-01-17 DIAGNOSIS — J45909 Unspecified asthma, uncomplicated: Secondary | ICD-10-CM | POA: Diagnosis not present

## 2019-01-17 DIAGNOSIS — Y939 Activity, unspecified: Secondary | ICD-10-CM | POA: Insufficient documentation

## 2019-01-17 DIAGNOSIS — Z7984 Long term (current) use of oral hypoglycemic drugs: Secondary | ICD-10-CM | POA: Insufficient documentation

## 2019-01-17 DIAGNOSIS — W010XXA Fall on same level from slipping, tripping and stumbling without subsequent striking against object, initial encounter: Secondary | ICD-10-CM | POA: Diagnosis not present

## 2019-01-17 DIAGNOSIS — Z79899 Other long term (current) drug therapy: Secondary | ICD-10-CM | POA: Diagnosis not present

## 2019-01-17 DIAGNOSIS — R519 Headache, unspecified: Secondary | ICD-10-CM | POA: Insufficient documentation

## 2019-01-17 DIAGNOSIS — Y929 Unspecified place or not applicable: Secondary | ICD-10-CM | POA: Diagnosis not present

## 2019-01-17 DIAGNOSIS — Y999 Unspecified external cause status: Secondary | ICD-10-CM | POA: Diagnosis not present

## 2019-01-17 DIAGNOSIS — R0602 Shortness of breath: Secondary | ICD-10-CM | POA: Diagnosis not present

## 2019-01-17 MED ORDER — ACETAMINOPHEN 325 MG PO TABS
650.0000 mg | ORAL_TABLET | Freq: Once | ORAL | Status: AC
  Administered 2019-01-17: 650 mg via ORAL
  Filled 2019-01-17: qty 2

## 2019-01-17 NOTE — ED Triage Notes (Signed)
Patient presents to the ED from home after a fall this am.  Patient reports history of vertigo and states she was about to make her bed this am, and then became dizzy and fell backwards.  Patient denies any dizziness at this time.  Patient is complaining of neck pain.  Patient reports hitting her head.  Denies passing out.  Patient takes blood thinners.

## 2019-01-17 NOTE — Telephone Encounter (Signed)
What made her fall?  What did she hit her head on ? (did she hit it hard) ? Any headache or dizziness right now  She is on plavix so we do have to be careful re: head injuries and monitor closely

## 2019-01-17 NOTE — Telephone Encounter (Signed)
Pt fell about 9:30 am. EMS and Fire came to evaluate. Bumped her head but no tenderness, no lethargy, no headache. Landed on her bottom, first. Said Dr Glori Bickers advised her to go the ER any time she hits her head. EMS told her that she would not get seen sooner if she came EMS. Asking if Dr. Glori Bickers thinks she needs to go to the ER.

## 2019-01-17 NOTE — ED Provider Notes (Signed)
Emergency Department Provider Note  ____________________________________________  Time seen: Approximately 5:35 PM  I have reviewed the triage vital signs and the nursing notes.   HISTORY  Chief Complaint Fall   Historian Patient     HPI Lydia King is a 85 y.o. female with a history of Charcot-Marie-Tooth and frequent falls, presents to the emergency department after she had a mechanical, nonsyncopal fall.  She reports that she hit the back of her head.  Patient denies losing consciousness but states that she had a sensation of "having the wind knocked out of me".  She has had some very mild shortness of breath.  She denies chest tightness or chest pain.  No abdominal pain.  She states that she has been able to ambulate since fall occurred.  No abrasions or lacerations.   Past Medical History:  Diagnosis Date  . Anemia    in past (from blood loss at hiatal hernia)  . Asthma   . Charcot-Marie-Tooth disease    walks with cane (fairly controlled)  . Diverticulosis   . DVT (deep venous thrombosis) ([place]) 2003   after sx  . GERD (gastroesophageal reflux disease)   . Grief reaction    on fluoxetine  . Headache(784.0)    migraine  . Hiatal hernia   . Hyperlipidemia   . Hypertension   . OA (ocular albinism) ([place])   . Osteopenia   . PUD (peptic ulcer disease)    gastric, past  . Shingles   . Stroke ([place]) 8/10   posterior reversible encephalopathy syndrome  . Urine incontinence      Immunizations up to date:  Yes.     Past Medical History:  Diagnosis Date  . Anemia    in past (from blood loss at hiatal hernia)  . Asthma   . Charcot-Marie-Tooth disease    walks with cane (fairly controlled)  . Diverticulosis   . DVT (deep venous thrombosis) ([place]) 2003   after sx  . GERD (gastroesophageal reflux disease)   . Grief reaction    on fluoxetine  . Headache(784.0)    migraine  . Hiatal hernia   . Hyperlipidemia   . Hypertension   . OA (ocular albinism)  ([place])   . Osteopenia   . PUD (peptic ulcer disease)    gastric, past  . Shingles   . Stroke ([place]) 8/10   posterior reversible encephalopathy syndrome  . Urine incontinence     Patient Active Problem List   Diagnosis Date Noted  . Hyperlipidemia associated with type 2 diabetes mellitus ([place]) 07/27/2018  . Sinus congestion 02/05/2018  . Vertigo 05/06/2017  . OSA (obstructive sleep apnea) 01/02/2016  . Pedal edema 01/02/2016  . Fall in home 12/11/2015  . Poor balance 12/11/2015  . Low back pain with sciatica 10/02/2015  . Right hip pain 10/02/2015  . Hearing loss 04/18/2015  . Encounter for Medicare annual wellness exam 10/04/2013  . Diabetes mellitus ([place]) 08/31/2012  . CVA 09/12/2008  . OSTEOARTHRITIS, MULTIPLE JOINTS 05/30/2008  . Osteopenia 05/30/2008  . Vitamin D deficiency 07/14/2007  . SLEEP APNEA 07/14/2007  . Essential hypertension 11/30/2006  . Asthma 11/30/2006  . H/O gastroesophageal reflux (GERD) 11/30/2006  . G I BLEED 11/30/2006  . DEEP VENOUS THROMBOPHLEBITIS, HX OF 11/30/2006    Past Surgical History:  Procedure Laterality Date  . carotid dopplers  8/10  . COLONOSCOPY  2002  . HERNIA REPAIR  2002   bleeding  . HIATAL HERNIA REPAIR  2000   needed  blood transfusion  . Stroke  8/10   hosp stroke/ posterior (vs posterior reversible encephalopathy syndrome) - d/c on Plavix  . TEE normal  [id number]   no emoblic source  . TOTAL KNEE ARTHROPLASTY  03,06   Bilateral  . VESICOVAGINAL FISTULA CLOSURE W/ TAH  1964   with bleeding and cervical dysplasia    Prior to Admission medications   Medication Sig Start Date End Date Taking? Authorizing Provider  acetaminophen (TYLENOL) 650 MG CR tablet Take 650 mg by mouth every 8 (eight) hours as needed for pain.    [provider]  azelastine (ASTELIN) 0.1 % nasal spray Place 1 spray into both nostrils 2 (two) times daily. Use in each nostril as directed 08/26/17   Cuthriell, Charline Bills, PA-C  Blood Glucose  Monitoring Suppl (FREESTYLE LITE) DEVI Use to check blood glucose daily and as needed for diabetes 10/11/17   Tower, Wynelle Fanny, MD  cetirizine (ZYRTEC) 10 MG tablet Take 1 tablet (10 mg total) by mouth daily. 08/26/17   Cuthriell, Charline Bills, PA-C  Cholecalciferol (VITAMIN D) 2000 UNITS CAPS Take 2 capsules by mouth daily.      [provider]  clopidogrel (PLAVIX) 75 MG tablet Take 1 tablet (75 mg total) by mouth daily. 07/27/18   Tower, Wynelle Fanny, MD  doxycycline (VIBRA-TABS) 100 MG tablet Take 1 tablet (100 mg total) by mouth 2 (two) times daily. 01/13/19   Tower, Wynelle Fanny, MD  esomeprazole (NEXIUM) 40 MG capsule Take 1 capsule (40 mg total) by mouth daily before breakfast. 07/27/18   Tower, Wynelle Fanny, MD  Flaxseed, Linseed, (FLAX SEED OIL) 1000 MG CAPS Take 1 capsule by mouth daily.      [provider]  FLUoxetine (PROZAC) 20 MG capsule Take 1 capsule (20 mg total) by mouth daily. 07/27/18   Tower, Wynelle Fanny, MD  furosemide (LASIX) 20 MG tablet Take 2 tablets (40 mg total) by mouth daily. 07/27/18   Tower, Marne A, MD  glucose blood (FREESTYLE LITE) test strip Use to check blood sugar once daily for DM (dx. [id number]) 11/08/18   Tower, Wynelle Fanny, MD  Lancets (FREESTYLE) lancets Use as instructedUse to check blood sugar once daily for DM (dx. [id number]) 11/08/18   Tower, Wynelle Fanny, MD  loratadine (CLARITIN) 10 MG tablet Take 1 tablet (10 mg total) by mouth daily. 11/09/18   Tower, Wynelle Fanny, MD  losartan (COZAAR) 100 MG tablet TAKE 1/2 TAB BY MOUTH ONCE A DAY 07/27/18   Tower, Wynelle Fanny, MD  metFORMIN (GLUCOPHAGE) 1000 MG tablet Take 1 tablet (1,000 mg total) by mouth 2 (two) times daily with a meal. 07/27/18   Tower, Wynelle Fanny, MD  Misc. Devices (WALKER) MISC Wheeled walker with seat- Pt must use for ambulation-- for use as directed: ambulation dx: 729.5, 780.79, 786.05, 784.0, 715.89.     [provider]  omega-3 acid ethyl esters (LOVAZA) 1 g capsule Take 1 capsule (1 g total) by mouth daily.  07/27/18   Tower, Wynelle Fanny, MD  PROAIR HFA [phone] Base) MCG/ACT inhaler INHALE 2 PUFFS INTO THE LUNGS EVERY 4 (FOUR) HOURS AS NEEDED FOR WHEEZING OR SHORTNESS OF BREATH. 03/05/16   Tower, Wynelle Fanny, MD  rosuvastatin (CRESTOR) 10 MG tablet Take 1 tablet (10 mg total) by mouth at bedtime. 07/27/18   Tower, Wynelle Fanny, MD    Allergies Shrimp [shellfish allergy], Amlodipine besylate, Atorvastatin, Ciprofloxacin, Codeine, Influenza vaccines, Nitrofurantoin, Omeprazole, Pantoprazole sodium, Penicillins, Prednisone, Simvastatin, and Sulfonamide derivatives  Family  History  Problem Relation Age of Onset  . Cerebral aneurysm Son   . Cerebral aneurysm Father   . Hyperlipidemia Father   . Alcohol abuse Father   . Stroke Mother   . Cervical cancer Sister   . Hyperlipidemia Brother   . Alcohol abuse Brother   . Stroke Brother   . Diabetes Brother   . Diabetes Sister   . Sarcoidosis Sister   . Other Sister        OP/ Broken hip  . Other Other        whole family has charcot marie tooth  . Ovarian cancer Sister     Social History Social History   Tobacco Use  . Smoking status: Never Smoker  . Smokeless tobacco: Never Used  Substance Use Topics  . Alcohol use: No  . Drug use: No     Review of Systems  Constitutional: No fever/chills Eyes:  No discharge ENT: No upper respiratory complaints. Respiratory: no cough. Patient endorses mild SOB.  Gastrointestinal:   No nausea, no vomiting.  No diarrhea.  No constipation. Musculoskeletal: Negative for musculoskeletal pain. Neuro: Patient has headache.  Skin: Negative for rash, abrasions, lacerations, ecchymosis.    ____________________________________________   PHYSICAL EXAM:  VITAL SIGNS: ED Triage Vitals  Enc Vitals Group     BP 01/17/19 1611 127/66     Pulse Rate 01/17/19 1611 78     Resp 01/17/19 1611 18     Temp 01/17/19 1611 98.5 F (36.9 C)     Temp Source 01/17/19 1611 Oral     SpO2 01/17/19 1611 96 %     Weight 01/17/19  1612 160 lb (72.6 kg)     Height 01/17/19 1612 5' (1.524 m)     Head Circumference --      Peak Flow --      Pain Score 01/17/19 1620 3     Pain Loc --      Pain Edu? --      Excl. in [place]? --      Constitutional: Alert and oriented. Well appearing and in no acute distress. Eyes: Conjunctivae are normal. PERRL. EOMI. Head: Atraumatic.  No palpable hematomas. ENT:      Nose: No congestion/rhinnorhea.      Mouth/Throat: Mucous membranes are moist.  Neck: No stridor.  No cervical spine tenderness to palpation. Cardiovascular: Normal rate, regular rhythm. Normal S1 and S2.  Good peripheral circulation.  Respiratory: Normal respiratory effort without tachypnea or retractions. Lungs CTAB. Good air entry to the bases with no decreased or absent breath sounds Gastrointestinal: Bowel sounds x 4 quadrants. Soft and nontender to palpation. No guarding or rigidity. No distention. Musculoskeletal: Full range of motion to all extremities. No obvious deformities noted Neurologic:  Normal for age. No gross focal neurologic deficits are appreciated.  No hypo or hyperreflexia.  Patient is able to perform rapid alternating movements. Skin:  Skin is warm, dry and intact. No rash noted. Psychiatric: Mood and affect are normal for age. Speech and behavior are normal.   ____________________________________________   LABS (all labs ordered are listed, but only abnormal results are displayed)  Labs Reviewed - No data to display ____________________________________________  EKG   ____________________________________________  RADIOLOGY Unk Pinto, personally viewed and evaluated these images (plain radiographs) as part of my medical decision making, as well as reviewing the written report by the radiologist.  CT Head Wo Contrast  Result Date: 01/17/2019 CLINICAL DATA:  [place] this morning.  Hit  head. EXAM: CT HEAD WITHOUT CONTRAST CT CERVICAL SPINE WITHOUT CONTRAST TECHNIQUE: Multidetector CT  imaging of the head and cervical spine was performed following the standard protocol without intravenous contrast. Multiplanar CT image reconstructions of the cervical spine were also generated. COMPARISON:  12/12/2017 FINDINGS: CT HEAD FINDINGS Brain: Stable age related cerebral atrophy, ventriculomegaly and periventricular white matter disease. Stable remote posterior circulation infarcts with occipital and cerebellar influx bilaterally and encephalomalacia. No extra-axial fluid collections are identified. No CT findings for acute hemispheric infarction or intracranial hemorrhage. No mass lesions. The brainstem and cerebellum are normal. Vascular: Stable advanced vascular calcifications. No aneurysm or hyperdense vessels. Skull: No skull fracture or bone lesion. Moderate hyperostosis frontalis interna. Sinuses/Orbits: The paranasal sinuses and mastoid air cells are clear. The globes are intact. Other: No scalp lesions or hematoma. CT CERVICAL SPINE FINDINGS Alignment: Stable degenerative cervical spondylosis with multilevel disc disease and facet disease but the overall alignment is maintained. Skull base and vertebrae: No acute fracture. No primary bone lesion or focal pathologic process. Soft tissues and spinal canal: No prevertebral fluid or swelling. No visible canal hematoma. Disc levels: The spinal canal is quite generous. No significant spinal stenosis. Mild multilevel bony foraminal narrowing due to uncinate spurring and facet disease. Upper chest: The lung apices are grossly clear. No worrisome pulmonary lesions. Other: No significant findings. IMPRESSION: 1. Chronic changes in the brain as detailed above. No acute findings or skull fracture. 2. Stable degenerative cervical spondylosis with multilevel disc disease and facet disease but no acute cervical spine fracture. Electronically Signed   By: Marijo Sanes M.D.   On: 01/17/2019 16:50   CT Cervical Spine Wo Contrast  Result Date:  01/17/2019 CLINICAL DATA:  [place] this morning.  Hit head. EXAM: CT HEAD WITHOUT CONTRAST CT CERVICAL SPINE WITHOUT CONTRAST TECHNIQUE: Multidetector CT imaging of the head and cervical spine was performed following the standard protocol without intravenous contrast. Multiplanar CT image reconstructions of the cervical spine were also generated. COMPARISON:  12/12/2017 FINDINGS: CT HEAD FINDINGS Brain: Stable age related cerebral atrophy, ventriculomegaly and periventricular white matter disease. Stable remote posterior circulation infarcts with occipital and cerebellar influx bilaterally and encephalomalacia. No extra-axial fluid collections are identified. No CT findings for acute hemispheric infarction or intracranial hemorrhage. No mass lesions. The brainstem and cerebellum are normal. Vascular: Stable advanced vascular calcifications. No aneurysm or hyperdense vessels. Skull: No skull fracture or bone lesion. Moderate hyperostosis frontalis interna. Sinuses/Orbits: The paranasal sinuses and mastoid air cells are clear. The globes are intact. Other: No scalp lesions or hematoma. CT CERVICAL SPINE FINDINGS Alignment: Stable degenerative cervical spondylosis with multilevel disc disease and facet disease but the overall alignment is maintained. Skull base and vertebrae: No acute fracture. No primary bone lesion or focal pathologic process. Soft tissues and spinal canal: No prevertebral fluid or swelling. No visible canal hematoma. Disc levels: The spinal canal is quite generous. No significant spinal stenosis. Mild multilevel bony foraminal narrowing due to uncinate spurring and facet disease. Upper chest: The lung apices are grossly clear. No worrisome pulmonary lesions. Other: No significant findings. IMPRESSION: 1. Chronic changes in the brain as detailed above. No acute findings or skull fracture. 2. Stable degenerative cervical spondylosis with multilevel disc disease and facet disease but no acute cervical  spine fracture. Electronically Signed   By: Marijo Sanes M.D.   On: 01/17/2019 16:50    ____________________________________________    PROCEDURES  Procedure(s) performed:     Procedures     Medications -  No data to display   ____________________________________________   INITIAL IMPRESSION / ASSESSMENT AND PLAN / ED COURSE  Pertinent labs & imaging results that were available during my care of the patient were reviewed by me and considered in my medical decision making (see chart for details).    Assessment and Plan:  Fall:  85 year old female presents to the emergency department after a mechanical fall which she states is common due to Charcot-Marie-Tooth.  Patient reported headache and neck pain.  Differential diagnosis includes intracranial bleed, skull fracture, concussion...  CT head and CT cervical spine revealed no evidence of skull fracture, intracranial bleed or C-spine fracture.  Chest x-ray reveals no evidence of pneumothorax.  Patient reported that her headache improved after Tylenol was administered.  Strict return precautions were given to return to the emergency department with new or worsening symptoms.  All patient questions were answered. ____________________________________________  FINAL CLINICAL IMPRESSION(S) / ED DIAGNOSES  Final diagnoses:  None      NEW MEDICATIONS STARTED DURING THIS VISIT:  ED Discharge Orders    None          This chart was dictated using voice recognition software/Dragon. Despite best efforts to proofread, errors can occur which can change the meaning. Any change was purely unintentional.     Lannie Fields, PA-C 01/17/19 1832    Harvest Dark, MD 01/17/19 1946

## 2019-01-17 NOTE — ED Notes (Signed)
See triage note  States she became dizzy and fell back   Hitting head  States she has a hx of vertigo

## 2019-01-19 ENCOUNTER — Telehealth: Payer: Self-pay

## 2019-01-19 NOTE — Telephone Encounter (Signed)
LVM w COVID screen, front door and back lab in fo 12.23.2020 TLJ

## 2019-01-22 ENCOUNTER — Encounter: Payer: Self-pay | Admitting: Family Medicine

## 2019-01-24 ENCOUNTER — Other Ambulatory Visit: Payer: Self-pay

## 2019-01-24 ENCOUNTER — Other Ambulatory Visit: Payer: Medicare Other

## 2019-01-26 ENCOUNTER — Ambulatory Visit: Payer: Medicare Other | Admitting: Family Medicine

## 2019-01-26 ENCOUNTER — Other Ambulatory Visit: Payer: Self-pay

## 2019-01-26 ENCOUNTER — Ambulatory Visit (INDEPENDENT_AMBULATORY_CARE_PROVIDER_SITE_OTHER): Payer: Medicare Other | Admitting: Family Medicine

## 2019-01-26 ENCOUNTER — Encounter: Payer: Self-pay | Admitting: Family Medicine

## 2019-01-26 VITALS — BP 111/61

## 2019-01-26 DIAGNOSIS — E1169 Type 2 diabetes mellitus with other specified complication: Secondary | ICD-10-CM

## 2019-01-26 DIAGNOSIS — J0111 Acute recurrent frontal sinusitis: Secondary | ICD-10-CM | POA: Diagnosis not present

## 2019-01-26 DIAGNOSIS — G8929 Other chronic pain: Secondary | ICD-10-CM

## 2019-01-26 DIAGNOSIS — J019 Acute sinusitis, unspecified: Secondary | ICD-10-CM | POA: Insufficient documentation

## 2019-01-26 DIAGNOSIS — E785 Hyperlipidemia, unspecified: Secondary | ICD-10-CM

## 2019-01-26 DIAGNOSIS — M5441 Lumbago with sciatica, right side: Secondary | ICD-10-CM

## 2019-01-26 DIAGNOSIS — E11618 Type 2 diabetes mellitus with other diabetic arthropathy: Secondary | ICD-10-CM | POA: Diagnosis not present

## 2019-01-26 DIAGNOSIS — I10 Essential (primary) hypertension: Secondary | ICD-10-CM

## 2019-01-26 MED ORDER — PREDNISONE 10 MG PO TABS: ORAL_TABLET | ORAL | 0 refills | Status: DC

## 2019-01-26 NOTE — Progress Notes (Signed)
Virtual Visit via Video Note  I connected with Lydia King on 01/26/19 at 12:00 PM EST by a video enabled telemedicine application and verified that I am speaking with the correct person using two identifiers.  Location: Patient: home Provider: office    I discussed the limitations of evaluation and management by telemedicine and the availability of in person appointments. The patient expressed understanding and agreed to proceed.  Parties involved in encounter  Patient: Lydia King   Provider:  Loura Pardon MD   History of Present Illness: Pt presents for f/u of chronic health problems   Taking abx for sinus infection times 2  Cannot tell that it is much better  Still hurts in cheeks R side burns  Clear nasal d/c with bad drainage  Dry cough No fever at all Feels fair   Ears pop -but a little better  Throat is not sore  No trouble with smell or taste  She stays in  covid test neg in clinic    Taking mucinex (suggested at [facility])  astelin ns  Not taking zyrtec  Using saline   Had one fall 2 weeks ago  [place] on tail bone- back still hurts  She had vertigo when she was making her bed -no warning  Head imaging was ok in ER  No longer having vertigo-did maneuver last night    Thinks she needs prednisone course  Tolerated low dose before   Weight - at home yesterday 160lb  Wt Readings from Last 3 Encounters:  01/17/19 160 lb (72.6 kg)  07/27/18 162 lb (73.5 kg)  02/05/18 166 lb 4 oz (75.4 kg)  appetite is good -no problems   Getting over a sinus infection    bp is stable today  No cp or palpitations or headaches or edema  No side effects to medicines  BP Readings from Last 3 Encounters:  01/26/19 111/61  01/17/19 127/66  07/27/18 132/72   lasix Losartan Good blood pressure    H/o CVA No symptoms lately  Taking plavix   DM2 Lab Results  Component Value Date   HGBA1C 7.1 (H) 07/27/2018  glucose 110-140 average in the ams  occ checks later in  the day- higher 140s-150s  Due for A1C when she can come in  Taking statin and ARB  Diet is fair  About the same  Does not think A1C will have changed  Has not splurged much for the holiday     Eye exam 11/20 Foot exam 1/20  Metformin 1000 mg bid Lab Results  Component Value Date   CREATININE 0.74 07/27/2018   BUN 23 07/27/2018   NA 141 07/27/2018   K 4.6 07/27/2018   CL 102 07/27/2018   CO2 31 07/27/2018    Hyperlipidemia Lab Results  Component Value Date   CHOL 156 07/27/2018   HDL 60.50 07/27/2018   LDLCALC 75 07/27/2018   LDLDIRECT 78.0 07/14/2016   TRIG 103.0 07/27/2018   CHOLHDL 3 07/27/2018   Taking crestor - good control   Patient Active Problem List   Diagnosis Date Noted  . Acute sinusitis 01/26/2019  . Hyperlipidemia associated with type 2 diabetes mellitus ([place]) 07/27/2018  . Sinus congestion 02/05/2018  . Vertigo 05/06/2017  . OSA (obstructive sleep apnea) 01/02/2016  . Pedal edema 01/02/2016  . Fall in home 12/11/2015  . Poor balance 12/11/2015  . Low back pain with sciatica 10/02/2015  . Right hip pain 10/02/2015  . Hearing loss 04/18/2015  . Encounter for  Medicare annual wellness exam 10/04/2013  . Diabetes mellitus ([place]) 08/31/2012  . CVA 09/12/2008  . OSTEOARTHRITIS, MULTIPLE JOINTS 05/30/2008  . Osteopenia 05/30/2008  . Vitamin D deficiency 07/14/2007  . SLEEP APNEA 07/14/2007  . Essential hypertension 11/30/2006  . Asthma 11/30/2006  . H/O gastroesophageal reflux (GERD) 11/30/2006  . G I BLEED 11/30/2006  . DEEP VENOUS THROMBOPHLEBITIS, HX OF 11/30/2006   Past Medical History:  Diagnosis Date  . Anemia    in past (from blood loss at hiatal hernia)  . Asthma   . Charcot-Marie-Tooth disease    walks with cane (fairly controlled)  . Diverticulosis   . DVT (deep venous thrombosis) ([place]) 2003   after sx  . GERD (gastroesophageal reflux disease)   . Grief reaction    on fluoxetine  . Headache(784.0)    migraine  . Hiatal hernia    . Hyperlipidemia   . Hypertension   . OA (ocular albinism) ([place])   . Osteopenia   . PUD (peptic ulcer disease)    gastric, past  . Shingles   . Stroke ([place]) 8/10   posterior reversible encephalopathy syndrome  . Urine incontinence    Past Surgical History:  Procedure Laterality Date  . carotid dopplers  8/10  . COLONOSCOPY  2002  . HERNIA REPAIR  2002   bleeding  . HIATAL HERNIA REPAIR  2000   needed blood transfusion  . Stroke  8/10   hosp stroke/ posterior (vs posterior reversible encephalopathy syndrome) - d/c on Plavix  . TEE normal  [id number]   no emoblic source  . TOTAL KNEE ARTHROPLASTY  03,06   Bilateral  . VESICOVAGINAL FISTULA CLOSURE W/ TAH  1964   with bleeding and cervical dysplasia   Social History   Tobacco Use  . Smoking status: Never Smoker  . Smokeless tobacco: Never Used  Substance Use Topics  . Alcohol use: No  . Drug use: No   Family History  Problem Relation Age of Onset  . Cerebral aneurysm Son   . Cerebral aneurysm Father   . Hyperlipidemia Father   . Alcohol abuse Father   . Stroke Mother   . Cervical cancer Sister   . Hyperlipidemia Brother   . Alcohol abuse Brother   . Stroke Brother   . Diabetes Brother   . Diabetes Sister   . Sarcoidosis Sister   . Other Sister        OP/ Broken hip  . Other Other        whole family has charcot marie tooth  . Ovarian cancer Sister    Allergies  Allergen Reactions  . Shrimp [Shellfish Allergy] Anaphylaxis    Anaphylaxis-swelling of throat/sob    . Amlodipine Besylate     REACTION: edema  . Atorvastatin     REACTION: pain- could not walk  . Ciprofloxacin     REACTION: unkown  . Codeine     REACTION: itchy rash  . Influenza Vaccines Other (See Comments)    Arm swelling, redness, chills, fever  . Nitrofurantoin   . Omeprazole     REACTION: not effective  . Pantoprazole Sodium     REACTION: does not work well  . Penicillins     REACTION: itchy rash  . Simvastatin     REACTION: leg  pain  . Sulfonamide Derivatives     REACTION: itchy rash   Current Outpatient Medications on File Prior to Visit  Medication Sig Dispense Refill  . acetaminophen (TYLENOL) 650 MG CR tablet  Take 650 mg by mouth every 8 (eight) hours as needed for pain.    Marland Kitchen azelastine (ASTELIN) 0.1 % nasal spray Place 1 spray into both nostrils 2 (two) times daily. Use in each nostril as directed 30 mL 0  . Blood Glucose Monitoring Suppl (FREESTYLE LITE) DEVI Use to check blood glucose daily and as needed for diabetes 1 each 0  . cetirizine (ZYRTEC) 10 MG tablet Take 1 tablet (10 mg total) by mouth daily. 30 tablet 0  . Cholecalciferol (VITAMIN D) 2000 UNITS CAPS Take 2 capsules by mouth daily.      . clopidogrel (PLAVIX) 75 MG tablet Take 1 tablet (75 mg total) by mouth daily. 90 tablet 3  . esomeprazole (NEXIUM) 40 MG capsule Take 1 capsule (40 mg total) by mouth daily before breakfast. 90 capsule 3  . Flaxseed, Linseed, (FLAX SEED OIL) 1000 MG CAPS Take 1 capsule by mouth daily.      Marland Kitchen FLUoxetine (PROZAC) 20 MG capsule Take 1 capsule (20 mg total) by mouth daily. 90 capsule 3  . furosemide (LASIX) 20 MG tablet Take 2 tablets (40 mg total) by mouth daily. 180 tablet 3  . glucose blood (FREESTYLE LITE) test strip Use to check blood sugar once daily for DM (dx. E11.618) 100 each 0  . Lancets (FREESTYLE) lancets Use as instructedUse to check blood sugar once daily for DM (dx. E11.618) 100 each 0  . loratadine (CLARITIN) 10 MG tablet Take 1 tablet (10 mg total) by mouth daily. 90 tablet 1  . losartan (COZAAR) 100 MG tablet TAKE 1/2 TAB BY MOUTH ONCE A DAY 45 tablet 3  . metFORMIN (GLUCOPHAGE) 1000 MG tablet Take 1 tablet (1,000 mg total) by mouth 2 (two) times daily with a meal. 180 tablet 3  . Misc. Devices (WALKER) MISC Wheeled walker with seat- Pt must use for ambulation-- for use as directed: ambulation dx: 729.5, 780.79, 786.05, 784.0, 715.89.     Marland Kitchen omega-3 acid ethyl esters (LOVAZA) 1 g capsule Take 1  capsule (1 g total) by mouth daily. 90 capsule 3  . PROAIR HFA 108 (90 Base) MCG/ACT inhaler INHALE 2 PUFFS INTO THE LUNGS EVERY 4 (FOUR) HOURS AS NEEDED FOR WHEEZING OR SHORTNESS OF BREATH. 8.5 g 3  . rosuvastatin (CRESTOR) 10 MG tablet Take 1 tablet (10 mg total) by mouth at bedtime. 90 tablet 3   No current facility-administered medications on file prior to visit.   Review of Systems  Constitutional: Positive for malaise/fatigue. Negative for chills and fever.  HENT: Positive for congestion and sinus pain. Negative for ear pain, nosebleeds and sore throat.   Eyes: Negative for blurred vision, discharge and redness.  Respiratory: Positive for cough. Negative for sputum production, shortness of breath, wheezing and stridor.   Cardiovascular: Negative for chest pain, palpitations and leg swelling.  Gastrointestinal: Negative for abdominal pain, diarrhea, nausea and vomiting.  Musculoskeletal: Negative for myalgias.  Skin: Negative for rash.  Neurological: Positive for dizziness. Negative for headaches.    Observations/Objective: Patient appears well, in no distress Weight is baseline  No facial swelling or asymmetry Voice is very slightly hoarse, no slurred speech  No obvious tremor or mobility impairment Moving neck and UEs normally Able to hear the call well  No cough or shortness of breath during interview  Talkative and mentally sharp with no cognitive changes No skin changes on face or neck , no rash or pallor Affect is normal    Assessment and Plan: Problem List Items  Addressed This Visit      Cardiovascular and Mediastinum   Essential hypertension    bp in fair control at this time  BP Readings from Last 1 Encounters:  01/26/19 111/61   No changes needed Most recent labs reviewed  Disc lifstyle change with low sodium diet and exercise          Respiratory   Acute sinusitis    Minimal improvement with abx- will add very low dose prednisone (she cannot tolerate  high dose) to see if this helps congestion Prednisone 10 mg daily for 5 d then 5 mg daily for 5 d Update if not starting to improve in a week or if worsening        Relevant Medications   predniSONE (DELTASONE) 10 MG tablet     Endocrine   Diabetes mellitus ([place]) - Primary    Due for A1C Expect stable but since going on low dose prednisone now for 10 d may be up  On arb and statin  utd eye exam  Disc low glycemic diet       Hyperlipidemia associated with type 2 diabetes mellitus ([place])    Disc goals for lipids and reasons to control them Rev last labs with pt Rev low sat fat diet in detail Due for labs-will schedule when she feels better  Continues crestor        Nervous and Auditory   Low back pain with sciatica    Worse after recent fall but gradually improving  Disc fall prev in detail  Noted ER visit earlier this mo      Relevant Medications   predniSONE (DELTASONE) 10 MG tablet       Follow Up Instructions: Take the prednisone as directed 10 mg daily for 5 days and then 5 mg daily for 5 days Alert [place] if not improving   Call and make an appointment for fasting labs when you feel better   Try to get most of your carbohydrates from produce (with the exception of white potatoes)  Eat less bread/pasta/rice/snack foods/cereals/sweets and other items from the middle of the grocery store (processed carbs)    I discussed the assessment and treatment plan with the patient. The patient was provided an opportunity to ask questions and all were answered. The patient agreed with the plan and demonstrated an understanding of the instructions.   The patient was advised to call back or seek an in-person evaluation if the symptoms worsen or if the condition fails to improve as anticipated.     Loura Pardon, MD

## 2019-01-26 NOTE — Patient Instructions (Signed)
Take the prednisone as directed 10 mg daily for 5 days and then 5 mg daily for 5 days Alert [place] if not improving   Call and make an appointment for fasting labs when you feel better   Try to get most of your carbohydrates from produce (with the exception of white potatoes)  Eat less bread/pasta/rice/snack foods/cereals/sweets and other items from the middle of the grocery store (processed carbs)

## 2019-01-26 NOTE — Assessment & Plan Note (Signed)
Disc goals for lipids and reasons to control them Rev last labs with pt Rev low sat fat diet in detail Due for labs-will schedule when she feels better  Continues crestor

## 2019-01-26 NOTE — Assessment & Plan Note (Signed)
Due for A1C Expect stable but since going on low dose prednisone now for 10 d may be up  On arb and statin  utd eye exam  Disc low glycemic diet

## 2019-01-26 NOTE — Assessment & Plan Note (Signed)
Worse after recent fall but gradually improving  Disc fall prev in detail  Noted ER visit earlier this mo

## 2019-01-26 NOTE — Assessment & Plan Note (Signed)
Minimal improvement with abx- will add very low dose prednisone (she cannot tolerate high dose) to see if this helps congestion Prednisone 10 mg daily for 5 d then 5 mg daily for 5 d Update if not starting to improve in a week or if worsening

## 2019-01-26 NOTE — Assessment & Plan Note (Signed)
bp in fair control at this time  BP Readings from Last 1 Encounters:  01/26/19 111/61   No changes needed Most recent labs reviewed  Disc lifstyle change with low sodium diet and exercise

## 2019-02-08 ENCOUNTER — Ambulatory Visit: Payer: Medicare Other | Admitting: Family Medicine

## 2019-02-14 DIAGNOSIS — Z20822 Contact with and (suspected) exposure to covid-19: Secondary | ICD-10-CM | POA: Diagnosis not present

## 2019-02-14 DIAGNOSIS — R0602 Shortness of breath: Secondary | ICD-10-CM | POA: Diagnosis not present

## 2019-02-14 DIAGNOSIS — K449 Diaphragmatic hernia without obstruction or gangrene: Secondary | ICD-10-CM | POA: Diagnosis not present

## 2019-02-27 ENCOUNTER — Other Ambulatory Visit: Payer: Self-pay | Admitting: Family Medicine

## 2019-03-14 ENCOUNTER — Emergency Department: Payer: Medicare Other

## 2019-03-14 ENCOUNTER — Encounter: Payer: Self-pay | Admitting: Emergency Medicine

## 2019-03-14 ENCOUNTER — Other Ambulatory Visit: Payer: Self-pay

## 2019-03-14 ENCOUNTER — Emergency Department
Admission: EM | Admit: 2019-03-14 | Discharge: 2019-03-14 | Disposition: A | Discharge status: Other Acute Inpatient Hospital | Payer: Medicare Other | Attending: Emergency Medicine | Admitting: Emergency Medicine

## 2019-03-14 DIAGNOSIS — Z8673 Personal history of transient ischemic attack (TIA), and cerebral infarction without residual deficits: Secondary | ICD-10-CM | POA: Insufficient documentation

## 2019-03-14 DIAGNOSIS — Z20822 Contact with and (suspected) exposure to covid-19: Secondary | ICD-10-CM | POA: Diagnosis not present

## 2019-03-14 DIAGNOSIS — S8991XA Unspecified injury of right lower leg, initial encounter: Secondary | ICD-10-CM | POA: Diagnosis present

## 2019-03-14 DIAGNOSIS — Z79899 Other long term (current) drug therapy: Secondary | ICD-10-CM | POA: Insufficient documentation

## 2019-03-14 DIAGNOSIS — R52 Pain, unspecified: Secondary | ICD-10-CM | POA: Diagnosis not present

## 2019-03-14 DIAGNOSIS — J45909 Unspecified asthma, uncomplicated: Secondary | ICD-10-CM | POA: Diagnosis not present

## 2019-03-14 DIAGNOSIS — Z7902 Long term (current) use of antithrombotics/antiplatelets: Secondary | ICD-10-CM | POA: Insufficient documentation

## 2019-03-14 DIAGNOSIS — R0902 Hypoxemia: Secondary | ICD-10-CM | POA: Diagnosis not present

## 2019-03-14 DIAGNOSIS — S098XXA Other specified injuries of head, initial encounter: Secondary | ICD-10-CM | POA: Diagnosis not present

## 2019-03-14 DIAGNOSIS — W19XXXA Unspecified fall, initial encounter: Secondary | ICD-10-CM | POA: Diagnosis not present

## 2019-03-14 DIAGNOSIS — S99911A Unspecified injury of right ankle, initial encounter: Secondary | ICD-10-CM | POA: Diagnosis not present

## 2019-03-14 DIAGNOSIS — M978XXA Periprosthetic fracture around other internal prosthetic joint, initial encounter: Secondary | ICD-10-CM

## 2019-03-14 DIAGNOSIS — Y92019 Unspecified place in single-family (private) house as the place of occurrence of the external cause: Secondary | ICD-10-CM | POA: Insufficient documentation

## 2019-03-14 DIAGNOSIS — D509 Iron deficiency anemia, unspecified: Secondary | ICD-10-CM | POA: Diagnosis not present

## 2019-03-14 DIAGNOSIS — M9701XA Periprosthetic fracture around internal prosthetic right hip joint, initial encounter: Secondary | ICD-10-CM | POA: Diagnosis not present

## 2019-03-14 DIAGNOSIS — S72401A Unspecified fracture of lower end of right femur, initial encounter for closed fracture: Secondary | ICD-10-CM | POA: Diagnosis not present

## 2019-03-14 DIAGNOSIS — M79604 Pain in right leg: Secondary | ICD-10-CM | POA: Diagnosis not present

## 2019-03-14 DIAGNOSIS — M2550 Pain in unspecified joint: Secondary | ICD-10-CM | POA: Diagnosis not present

## 2019-03-14 DIAGNOSIS — M9711XA Periprosthetic fracture around internal prosthetic right knee joint, initial encounter: Secondary | ICD-10-CM | POA: Insufficient documentation

## 2019-03-14 DIAGNOSIS — S59901A Unspecified injury of right elbow, initial encounter: Secondary | ICD-10-CM | POA: Diagnosis not present

## 2019-03-14 DIAGNOSIS — Z96659 Presence of unspecified artificial knee joint: Secondary | ICD-10-CM

## 2019-03-14 DIAGNOSIS — Y999 Unspecified external cause status: Secondary | ICD-10-CM | POA: Insufficient documentation

## 2019-03-14 DIAGNOSIS — E1165 Type 2 diabetes mellitus with hyperglycemia: Secondary | ICD-10-CM | POA: Diagnosis not present

## 2019-03-14 DIAGNOSIS — E785 Hyperlipidemia, unspecified: Secondary | ICD-10-CM | POA: Diagnosis not present

## 2019-03-14 DIAGNOSIS — Z7984 Long term (current) use of oral hypoglycemic drugs: Secondary | ICD-10-CM | POA: Insufficient documentation

## 2019-03-14 DIAGNOSIS — W01198A Fall on same level from slipping, tripping and stumbling with subsequent striking against other object, initial encounter: Secondary | ICD-10-CM | POA: Diagnosis not present

## 2019-03-14 DIAGNOSIS — S199XXA Unspecified injury of neck, initial encounter: Secondary | ICD-10-CM | POA: Diagnosis not present

## 2019-03-14 DIAGNOSIS — Z96653 Presence of artificial knee joint, bilateral: Secondary | ICD-10-CM | POA: Diagnosis not present

## 2019-03-14 DIAGNOSIS — Y33XXXA Other specified events, undetermined intent, initial encounter: Secondary | ICD-10-CM | POA: Diagnosis not present

## 2019-03-14 DIAGNOSIS — Y939 Activity, unspecified: Secondary | ICD-10-CM | POA: Diagnosis not present

## 2019-03-14 DIAGNOSIS — W1839XA Other fall on same level, initial encounter: Secondary | ICD-10-CM | POA: Diagnosis not present

## 2019-03-14 DIAGNOSIS — M545 Low back pain, unspecified: Secondary | ICD-10-CM

## 2019-03-14 DIAGNOSIS — Z7401 Bed confinement status: Secondary | ICD-10-CM | POA: Diagnosis not present

## 2019-03-14 DIAGNOSIS — S72491A Other fracture of lower end of right femur, initial encounter for closed fracture: Secondary | ICD-10-CM | POA: Diagnosis not present

## 2019-03-14 DIAGNOSIS — M25571 Pain in right ankle and joints of right foot: Secondary | ICD-10-CM | POA: Diagnosis not present

## 2019-03-14 DIAGNOSIS — Y92002 Bathroom of unspecified non-institutional (private) residence single-family (private) house as the place of occurrence of the external cause: Secondary | ICD-10-CM | POA: Diagnosis not present

## 2019-03-14 DIAGNOSIS — E119 Type 2 diabetes mellitus without complications: Secondary | ICD-10-CM | POA: Insufficient documentation

## 2019-03-14 DIAGNOSIS — S0990XA Unspecified injury of head, initial encounter: Secondary | ICD-10-CM | POA: Diagnosis not present

## 2019-03-14 DIAGNOSIS — G4733 Obstructive sleep apnea (adult) (pediatric): Secondary | ICD-10-CM | POA: Diagnosis not present

## 2019-03-14 DIAGNOSIS — I10 Essential (primary) hypertension: Secondary | ICD-10-CM | POA: Diagnosis not present

## 2019-03-14 DIAGNOSIS — R000 Tachycardia, unspecified: Secondary | ICD-10-CM | POA: Diagnosis not present

## 2019-03-14 DIAGNOSIS — M25561 Pain in right knee: Secondary | ICD-10-CM | POA: Diagnosis not present

## 2019-03-14 DIAGNOSIS — S299XXA Unspecified injury of thorax, initial encounter: Secondary | ICD-10-CM | POA: Diagnosis not present

## 2019-03-14 DIAGNOSIS — S3992XA Unspecified injury of lower back, initial encounter: Secondary | ICD-10-CM | POA: Diagnosis not present

## 2019-03-14 LAB — COMPREHENSIVE METABOLIC PANEL
ALT: 13 U/L (ref 0–44)
AST: 15 U/L (ref 15–41)
Albumin: 3.6 g/dL (ref 3.5–5.0)
Alkaline Phosphatase: 54 U/L (ref 38–126)
Anion gap: 6 (ref 5–15)
BUN: 20 mg/dL (ref 8–23)
CO2: 30 mmol/L (ref 22–32)
Calcium: 8.8 mg/dL — ABNORMAL LOW (ref 8.9–10.3)
Chloride: 103 mmol/L (ref 98–111)
Creatinine, Ser: 0.62 mg/dL (ref 0.44–1.00)
GFR calc Af Amer: >60 mL/min (ref >60)
GFR calc non Af Amer: >60 mL/min (ref >60)
Glucose, Bld: 203 mg/dL — ABNORMAL HIGH (ref 70–99)
Potassium: 3.9 mmol/L (ref 3.5–5.1)
Sodium: 139 mmol/L (ref 135–145)
Total Bilirubin: 0.7 mg/dL (ref 0.3–1.2)
Total Protein: 5.7 g/dL — ABNORMAL LOW (ref 6.5–8.1)

## 2019-03-14 LAB — CBC WITH DIFFERENTIAL/PLATELET
Abs Immature Granulocytes: 0.03 10*3/uL (ref 0.00–0.07)
Basophils Absolute: 0 10*3/uL (ref 0.0–0.1)
Basophils Relative: 0 %
Eosinophils Absolute: 0 10*3/uL (ref 0.0–0.5)
Eosinophils Relative: 0 %
HCT: 33.3 % — ABNORMAL LOW (ref 36.0–46.0)
Hemoglobin: 10.6 g/dL — ABNORMAL LOW (ref 12.0–15.0)
Immature Granulocytes: 0 %
Lymphocytes Relative: 11 %
Lymphs Abs: 1 10*3/uL (ref 0.7–4.0)
MCH: 30.3 pg (ref 26.0–34.0)
MCHC: 31.8 g/dL (ref 30.0–36.0)
MCV: 95.1 fL (ref 80.0–100.0)
Monocytes Absolute: 0.3 10*3/uL (ref 0.1–1.0)
Monocytes Relative: 4 %
Neutro Abs: 7.5 10*3/uL (ref 1.7–7.7)
Neutrophils Relative %: 85 %
Platelets: 139 10*3/uL — ABNORMAL LOW (ref 150–400)
RBC: 3.5 MIL/uL — ABNORMAL LOW (ref 3.87–5.11)
RDW: 13.1 % (ref 11.5–15.5)
WBC: 8.9 10*3/uL (ref 4.0–10.5)
nRBC: 0 % (ref 0.0–0.2)

## 2019-03-14 LAB — TYPE AND SCREEN
ABO/RH(D): A NEG
Antibody Screen: NEGATIVE

## 2019-03-14 LAB — PROTIME-INR
INR: 0.9 (ref 0.8–1.2)
Prothrombin Time: 12.1 seconds (ref 11.4–15.2)

## 2019-03-14 MED ORDER — ONDANSETRON HCL 4 MG/2ML IJ SOLN
4.0000 mg | Freq: Once | INTRAMUSCULAR | Status: AC
  Administered 2019-03-14: 22:00:00 4 mg via INTRAVENOUS
  Filled 2019-03-14: qty 2

## 2019-03-14 MED ORDER — SODIUM CHLORIDE 0.9 % IV SOLN: Freq: Once | INTRAVENOUS | Status: AC

## 2019-03-14 MED ORDER — MORPHINE SULFATE (PF) 4 MG/ML IV SOLN
4.0000 mg | Freq: Once | INTRAVENOUS | Status: DC
  Filled 2019-03-14: qty 1

## 2019-03-14 MED ORDER — FENTANYL CITRATE (PF) 100 MCG/2ML IJ SOLN
100.0000 ug | Freq: Once | INTRAMUSCULAR | Status: AC
  Administered 2019-03-14: 22:00:00 100 ug via INTRAVENOUS
  Filled 2019-03-14: qty 2

## 2019-03-14 NOTE — ED Notes (Signed)
[name] and POA updated regarding status of transfer.

## 2019-03-14 NOTE — ED Triage Notes (Signed)
Pt presents to ED via [facility] from home with c/o mechanical fall and R knee pain. Per EMS pt caught her toe and fell, pt with difficulty ambulating from previous strokes.  20G to R AC 187mcg Fentanyl given PTA    187/92 CBG 288

## 2019-03-14 NOTE — ED Notes (Signed)
This RN spoke with patient's daughter and POA Madaline Savage, due to fielding multiple phone calls from multiple different family members, this RN apologized and explained to POA that POA would be the only family member updated. Pt has portable cordless phone at bedside to make phone calls as she pleases. This RN explained due to limited time/nursing staff, POA states understanding and states she will explain to rest of family.

## 2019-03-14 NOTE — ED Provider Notes (Addendum)
[facility] Emergency Department Provider Note ____________________________________________  Time seen: Approximately 9:16 PM  I have reviewed the triage vital signs and the nursing notes.   HISTORY  Chief Complaint Fall    HPI Lydia King is a 86 y.o. female who presents to the emergency department for evaluation and treatment of right knee and low back pain after mechanical, nonsyncopal fall.  Patient was at her daughter's house and tripped on a rug.  She landed directly on her knee.  She did not strike her head or experience any loss of consciousness.  She denies pain in her hips.  She has a history of right knee replacement by a physician in [place].  She has been unable  to bear any weight on the right lower extremity since the fall.  In route, EMS gave her fentanyl.  Past Medical History:  Diagnosis Date  . Anemia    in past (from blood loss at hiatal hernia)  . Asthma   . Charcot-Marie-Tooth disease    walks with cane (fairly controlled)  . Diverticulosis   . DVT (deep venous thrombosis) ([place]) 2003   after sx  . GERD (gastroesophageal reflux disease)   . Grief reaction    on fluoxetine  . Headache(784.0)    migraine  . Hiatal hernia   . Hyperlipidemia   . Hypertension   . OA (ocular albinism) ([place])   . Osteopenia   . PUD (peptic ulcer disease)    gastric, past  . Shingles   . Stroke ([place]) 8/10   posterior reversible encephalopathy syndrome  . Urine incontinence     Patient Active Problem List   Diagnosis Date Noted  . Acute sinusitis 01/26/2019  . Hyperlipidemia associated with type 2 diabetes mellitus ([place]) 07/27/2018  . Sinus congestion 02/05/2018  . Vertigo 05/06/2017  . OSA (obstructive sleep apnea) 01/02/2016  . Pedal edema 01/02/2016  . Fall in home 12/11/2015  . Poor balance 12/11/2015  . Low back pain with sciatica 10/02/2015  . Right hip pain 10/02/2015  . Hearing loss 04/18/2015  . Encounter for Medicare annual  wellness exam 10/04/2013  . Diabetes mellitus ([place]) 08/31/2012  . CVA 09/12/2008  . OSTEOARTHRITIS, MULTIPLE JOINTS 05/30/2008  . Osteopenia 05/30/2008  . Vitamin D deficiency 07/14/2007  . SLEEP APNEA 07/14/2007  . Essential hypertension 11/30/2006  . Asthma 11/30/2006  . H/O gastroesophageal reflux (GERD) 11/30/2006  . G I BLEED 11/30/2006  . DEEP VENOUS THROMBOPHLEBITIS, HX OF 11/30/2006    Past Surgical History:  Procedure Laterality Date  . carotid dopplers  8/10  . COLONOSCOPY  2002  . HERNIA REPAIR  2002   bleeding  . HIATAL HERNIA REPAIR  2000   needed blood transfusion  . Stroke  8/10   hosp stroke/ posterior (vs posterior reversible encephalopathy syndrome) - d/c on Plavix  . TEE normal  [id number]   no emoblic source  . TOTAL KNEE ARTHROPLASTY  03,06   Bilateral  . VESICOVAGINAL FISTULA CLOSURE W/ TAH  1964   with bleeding and cervical dysplasia    Prior to Admission medications   Medication Sig Start Date End Date Taking? Authorizing Provider  acetaminophen (TYLENOL) 650 MG CR tablet Take 650 mg by mouth every 8 (eight) hours as needed for pain.    [provider]  azelastine (ASTELIN) 0.1 % nasal spray Place 1 spray into both nostrils 2 (two) times daily. Use in each nostril as directed 08/26/17   Cuthriell, Charline Bills, PA-C  Blood  Glucose Monitoring Suppl (FREESTYLE LITE) DEVI Use to check blood glucose daily and as needed for diabetes 10/11/17   Tower, Wynelle Fanny, MD  cetirizine (ZYRTEC) 10 MG tablet Take 1 tablet (10 mg total) by mouth daily. 08/26/17   Cuthriell, Charline Bills, PA-C  Cholecalciferol (VITAMIN D) 2000 UNITS CAPS Take 2 capsules by mouth daily.      [provider]  clopidogrel (PLAVIX) 75 MG tablet Take 1 tablet (75 mg total) by mouth daily. 07/27/18   Tower, Wynelle Fanny, MD  esomeprazole (NEXIUM) 40 MG capsule Take 1 capsule (40 mg total) by mouth daily before breakfast. 07/27/18   Tower, Wynelle Fanny, MD  Flaxseed, Linseed, (FLAX SEED OIL) 1000 MG  CAPS Take 1 capsule by mouth daily.      [provider]  FLUoxetine (PROZAC) 20 MG capsule Take 1 capsule (20 mg total) by mouth daily. 07/27/18   Tower, Wynelle Fanny, MD  furosemide (LASIX) 20 MG tablet Take 2 tablets (40 mg total) by mouth daily. 07/27/18   Tower, Marne A, MD  glucose blood (FREESTYLE LITE) test strip Use to check blood sugar once daily for DM (dx. [id number]) 11/08/18   Tower, Wynelle Fanny, MD  Lancets (FREESTYLE) lancets Use as instructedUse to check blood sugar once daily for DM (dx. E11.618) 11/08/18   Tower, Wynelle Fanny, MD  loratadine (CLARITIN) 10 MG tablet TAKE 1 TABLET BY MOUTH EVERY DAY 02/28/19   Tower, Wynelle Fanny, MD  losartan (COZAAR) 100 MG tablet TAKE 1/2 TAB BY MOUTH ONCE A DAY 07/27/18   Tower, Wynelle Fanny, MD  metFORMIN (GLUCOPHAGE) 1000 MG tablet Take 1 tablet (1,000 mg total) by mouth 2 (two) times daily with a meal. 07/27/18   Tower, Wynelle Fanny, MD  Misc. Devices (WALKER) MISC Wheeled walker with seat- Pt must use for ambulation-- for use as directed: ambulation dx: 729.5, 780.79, 786.05, 784.0, 715.89.     [provider]  omega-3 acid ethyl esters (LOVAZA) 1 g capsule Take 1 capsule (1 g total) by mouth daily. 07/27/18   Tower, Wynelle Fanny, MD  predniSONE (DELTASONE) 10 MG tablet Take 1 pill by mouth once daily for 5 days, then 1/2 pill once daily for 5 days 01/26/19   Tower, Wynelle Fanny, MD  PROAIR HFA 108 [phone] Base) MCG/ACT inhaler INHALE 2 PUFFS INTO THE LUNGS EVERY 4 (FOUR) HOURS AS NEEDED FOR WHEEZING OR SHORTNESS OF BREATH. 03/05/16   Tower, Wynelle Fanny, MD  rosuvastatin (CRESTOR) 10 MG tablet Take 1 tablet (10 mg total) by mouth at bedtime. 07/27/18   Tower, Wynelle Fanny, MD    Allergies Morphine and related, Shrimp [shellfish allergy], Amlodipine besylate, Atorvastatin, Ciprofloxacin, Codeine, Influenza vaccines, Nitrofurantoin, Omeprazole, Pantoprazole sodium, Penicillins, Simvastatin, and Sulfonamide derivatives  Family History  Problem Relation Age of Onset  . Cerebral aneurysm  Son   . Cerebral aneurysm Father   . Hyperlipidemia Father   . Alcohol abuse Father   . Stroke Mother   . Cervical cancer Sister   . Hyperlipidemia Brother   . Alcohol abuse Brother   . Stroke Brother   . Diabetes Brother   . Diabetes Sister   . Sarcoidosis Sister   . Other Sister        OP/ Broken hip  . Other Other        whole family has charcot marie tooth  . Ovarian cancer Sister     Social History Social History   Tobacco Use  . Smoking status: Never Smoker  .  Smokeless tobacco: Never Used  Substance Use Topics  . Alcohol use: No  . Drug use: No    Review of Systems Constitutional: Negative for fever. Cardiovascular: Negative for chest pain. Respiratory: Negative for shortness of breath. Musculoskeletal: Positive for low back pain and right knee pain. Skin: Negative for open wounds or lesions. Neurological: Negative for decrease in sensation  ____________________________________________   PHYSICAL EXAM:  VITAL SIGNS: ED Triage Vitals  Enc Vitals Group     BP 03/14/19 2009 (!) 160/67     Pulse Rate 03/14/19 2009 82     Resp 03/14/19 2009 20     Temp 03/14/19 2009 98.2 F (36.8 C)     Temp Source 03/14/19 2009 Oral     SpO2 03/14/19 2009 93 %     Weight 03/14/19 2004 160 lb (72.6 kg)     Height 03/14/19 2004 4\' 11"  (1.499 m)     Head Circumference --      Peak Flow --      Pain Score 03/14/19 2003 6     Pain Loc --      Pain Edu? --      Excl. in [place]? --     Constitutional: Alert and oriented. Well appearing and in no acute distress. Eyes: Conjunctivae are clear without discharge or drainage Head: Atraumatic Neck: Supple.  No focal midline tenderness. Respiratory: No cough. Respirations are even and unlabored. Musculoskeletal: Diffuse edema over the right knee with focal tenderness over the medial aspect.  Right ankle nontender with range of motion. Neurologic: Motor and sensory function of the right foot and toes intact. Skin: No open  wounds. Psychiatric: Affect and behavior are appropriate.  ____________________________________________   LABS (all labs ordered are listed, but only abnormal results are displayed)  Labs Reviewed  COMPREHENSIVE METABOLIC PANEL - Abnormal; Notable for the following components:      Result Value   Glucose, Bld 203 (*)    Calcium 8.8 (*)    Total Protein 5.7 (*)    All other components within normal limits  CBC WITH DIFFERENTIAL/PLATELET - Abnormal; Notable for the following components:   RBC 3.50 (*)    Hemoglobin 10.6 (*)    HCT 33.3 (*)    Platelets 139 (*)    All other components within normal limits  RESPIRATORY PANEL BY RT PCR (FLU A&B, COVID)  PROTIME-INR  TYPE AND SCREEN   ____________________________________________  RADIOLOGY  Distal femur periprosthetic fracture  I, Kierra Jezewski, personally viewed and evaluated these images (plain radiographs) as part of my medical decision making, as well as reviewing the written report by the radiologist.  DG Lumbar Spine 2-3 Views  Result Date: 03/14/2019 CLINICAL DATA:  Mechanical fall EXAM: LUMBAR SPINE - 2-3 VIEW COMPARISON:  12/12/2017 FINDINGS: Bones appear osteopenic. Minimal dextroscoliosis of the lumbar spine. Exaggerated lumbar lordosis. Chronic mild wedging deformities at the lower thoracic spine. Lumbar vertebral body heights are grossly maintained. Diffuse degenerative changes throughout the lumbar spine, most advanced at L2-L3 and L5-S1. aortic atherosclerosis. IMPRESSION: Bones appear osteopenic. No definitive acute osseous abnormality is seen. Electronically Signed   By: Donavan Foil M.D.   On: 03/14/2019 21:20   DG Knee 1-2 Views Right  Result Date: 03/14/2019 CLINICAL DATA:  Fall with right knee pain EXAM: RIGHT KNEE - 1-2 VIEW COMPARISON:  None. FINDINGS: Prior right knee replacement. Acute comminuted distal femoral periprosthetic fracture with moderate to marked apex anterior angulation at the fracture site and  about 1/2 bone with  posterior displacement of distal fracture fragment. Diffuse soft tissue swelling. IMPRESSION: Prior knee replacement. Acute comminuted, displaced and angulated distal femoral periprosthetic fracture Electronically Signed   By: Donavan Foil M.D.   On: 03/14/2019 21:22   DG Ankle Complete Right  Result Date: 03/14/2019 CLINICAL DATA:  Fall EXAM: RIGHT ANKLE - COMPLETE 3+ VIEW COMPARISON:  None. FINDINGS: Diffuse soft tissue edema. Fixating screws within the talus and calcaneus with bony fusion present. Bulky calcification at the posterior plantar fascia. Moderate calcaneal heel spur. Bulky dorsal osteophyte at the midfoot. No acute displaced fracture is seen. IMPRESSION: 1. No acute osseous abnormality. 2. Postsurgical changes of the foot. Electronically Signed   By: Donavan Foil M.D.   On: 03/14/2019 22:08   DG Chest Portable 1 View  Result Date: 03/14/2019 CLINICAL DATA:  Preop fall with fracture EXAM: PORTABLE CHEST 1 VIEW COMPARISON:  01/17/2019 FINDINGS: Limited by patient rotation and lordosis. The heart is slightly enlarged. Allowing for positioning, no gross focal airspace disease, pleural effusion or pneumothorax. Moderate retrocardiac lucent structure, suspect for a hiatal hernia. IMPRESSION: 1. Limited by positioning. No definite acute airspace disease is seen 2. Suspected moderate to large hiatal hernia Electronically Signed   By: Donavan Foil M.D.   On: 03/14/2019 22:05   ____________________________________________   PROCEDURES  Procedures  EKG  ED ECG REPORT I, Trenice Mesa, FNP-BC personally viewed and interpreted this ECG.   Date: 03/14/2019  EKG Time: 2246  Rate: 82  Rhythm: normal EKG, normal sinus rhythm  Axis: normal  Intervals:none  ST&T Change: no ST elevation  ____________________________________________   INITIAL IMPRESSION / ASSESSMENT AND PLAN / ED COURSE  Lydia King is a 86 y.o. who presents to the emergency department for  treatment and evaluation after mechanical, nonsyncopal fall at her daughter's house.  See HPI for further details.  Plan will be to image the lower back and right knee.   Differential diagnosis includes but is not limited to: Femur fracture, knee sprain, prosthetic hardware damage to the knee, tibia or fibula fracture, vertebral injury of the lumbar spine.  ----------------------------------------- 9:27 PM on 03/14/2019 -----------------------------------------  After speaking with Dr.Poggi he advises the patient be transferred due to potential need for complete revision and distal femoral replacement.   Discussed results and need for transfer with the patient who would like to speak to her daughter before deciding between [facility]. ----------------------------------------- 10:16 PM on 03/14/2019 ----------------------------------------- Patient requesting transfer to [facility] and has been accepted for ED to ED transfer. Accepting physician is Dr. Doyne Keel.  DP and PT pulses present. Right foot remains warm. Pain fairly well controlled at this time after Fentanyl.   ----------------------------------------- 10:55 PM on 03/14/2019 -----------------------------------------  EMS here to transport patient.  Knee immobilizer in place.  No change in neurovascular assessment.  Patient stable for transport at this time.  Medications  ondansetron (ZOFRAN) injection 4 mg (4 mg Intravenous Given 03/14/19 2206)  0.9 %  sodium chloride infusion ( Intravenous Stopped 03/14/19 2253)  fentaNYL (SUBLIMAZE) injection 100 mcg (100 mcg Intravenous Given 03/14/19 2206)    Pertinent labs & imaging results that were available during my care of the patient were reviewed by me and considered in my medical decision making (see chart for details).   _________________________________________   FINAL CLINICAL IMPRESSION(S) / ED DIAGNOSES  Final diagnoses:  Acute lumbar back pain  Periprosthetic fracture  around prosthetic knee, initial encounter    ED Discharge Orders    None  If controlled substance prescribed during this visit, 12 month history viewed on the [place] prior to issuing an initial prescription for Schedule II or III opiod.   Victorino Dike, FNP 03/14/19 2326    Victorino Dike, FNP 03/14/19 2355    Vanessa Palmer, MD 03/15/19 1501

## 2019-03-15 DIAGNOSIS — E119 Type 2 diabetes mellitus without complications: Secondary | ICD-10-CM | POA: Diagnosis not present

## 2019-03-15 DIAGNOSIS — W19XXXA Unspecified fall, initial encounter: Secondary | ICD-10-CM | POA: Diagnosis not present

## 2019-03-15 DIAGNOSIS — Z20822 Contact with and (suspected) exposure to covid-19: Secondary | ICD-10-CM | POA: Diagnosis present

## 2019-03-15 DIAGNOSIS — W1839XA Other fall on same level, initial encounter: Secondary | ICD-10-CM | POA: Diagnosis not present

## 2019-03-15 DIAGNOSIS — G9389 Other specified disorders of brain: Secondary | ICD-10-CM | POA: Diagnosis present

## 2019-03-15 DIAGNOSIS — S72491D Other fracture of lower end of right femur, subsequent encounter for closed fracture with routine healing: Secondary | ICD-10-CM | POA: Diagnosis not present

## 2019-03-15 DIAGNOSIS — M9711XA Periprosthetic fracture around internal prosthetic right knee joint, initial encounter: Secondary | ICD-10-CM | POA: Insufficient documentation

## 2019-03-15 DIAGNOSIS — Y33XXXA Other specified events, undetermined intent, initial encounter: Secondary | ICD-10-CM | POA: Diagnosis not present

## 2019-03-15 DIAGNOSIS — Y9289 Other specified places as the place of occurrence of the external cause: Secondary | ICD-10-CM | POA: Diagnosis not present

## 2019-03-15 DIAGNOSIS — S0990XA Unspecified injury of head, initial encounter: Secondary | ICD-10-CM | POA: Diagnosis present

## 2019-03-15 DIAGNOSIS — G4733 Obstructive sleep apnea (adult) (pediatric): Secondary | ICD-10-CM | POA: Diagnosis present

## 2019-03-15 DIAGNOSIS — S72401A Unspecified fracture of lower end of right femur, initial encounter for closed fracture: Secondary | ICD-10-CM | POA: Diagnosis not present

## 2019-03-15 DIAGNOSIS — M25571 Pain in right ankle and joints of right foot: Secondary | ICD-10-CM | POA: Diagnosis not present

## 2019-03-15 DIAGNOSIS — M6250 Muscle wasting and atrophy, not elsewhere classified, unspecified site: Secondary | ICD-10-CM | POA: Diagnosis not present

## 2019-03-15 DIAGNOSIS — W1830XA Fall on same level, unspecified, initial encounter: Secondary | ICD-10-CM | POA: Diagnosis not present

## 2019-03-15 DIAGNOSIS — D509 Iron deficiency anemia, unspecified: Secondary | ICD-10-CM | POA: Diagnosis present

## 2019-03-15 DIAGNOSIS — W010XXA Fall on same level from slipping, tripping and stumbling without subsequent striking against object, initial encounter: Secondary | ICD-10-CM | POA: Diagnosis not present

## 2019-03-15 DIAGNOSIS — H47619 Cortical blindness, unspecified side of brain: Secondary | ICD-10-CM | POA: Diagnosis present

## 2019-03-15 DIAGNOSIS — R296 Repeated falls: Secondary | ICD-10-CM | POA: Diagnosis present

## 2019-03-15 DIAGNOSIS — Z7902 Long term (current) use of antithrombotics/antiplatelets: Secondary | ICD-10-CM | POA: Diagnosis not present

## 2019-03-15 DIAGNOSIS — M21371 Foot drop, right foot: Secondary | ICD-10-CM | POA: Diagnosis present

## 2019-03-15 DIAGNOSIS — M9711XD Periprosthetic fracture around internal prosthetic right knee joint, subsequent encounter: Secondary | ICD-10-CM | POA: Diagnosis not present

## 2019-03-15 DIAGNOSIS — M9701XA Periprosthetic fracture around internal prosthetic right hip joint, initial encounter: Secondary | ICD-10-CM | POA: Diagnosis not present

## 2019-03-15 DIAGNOSIS — M47812 Spondylosis without myelopathy or radiculopathy, cervical region: Secondary | ICD-10-CM | POA: Diagnosis present

## 2019-03-15 DIAGNOSIS — M6281 Muscle weakness (generalized): Secondary | ICD-10-CM | POA: Diagnosis not present

## 2019-03-15 DIAGNOSIS — Z882 Allergy status to sulfonamides status: Secondary | ICD-10-CM | POA: Diagnosis not present

## 2019-03-15 DIAGNOSIS — R262 Difficulty in walking, not elsewhere classified: Secondary | ICD-10-CM | POA: Diagnosis not present

## 2019-03-15 DIAGNOSIS — S098XXA Other specified injuries of head, initial encounter: Secondary | ICD-10-CM | POA: Diagnosis not present

## 2019-03-15 DIAGNOSIS — Z86718 Personal history of other venous thrombosis and embolism: Secondary | ICD-10-CM | POA: Diagnosis not present

## 2019-03-15 DIAGNOSIS — Z7984 Long term (current) use of oral hypoglycemic drugs: Secondary | ICD-10-CM | POA: Diagnosis not present

## 2019-03-15 DIAGNOSIS — Z8673 Personal history of transient ischemic attack (TIA), and cerebral infarction without residual deficits: Secondary | ICD-10-CM | POA: Diagnosis not present

## 2019-03-15 DIAGNOSIS — E785 Hyperlipidemia, unspecified: Secondary | ICD-10-CM | POA: Diagnosis present

## 2019-03-15 DIAGNOSIS — I10 Essential (primary) hypertension: Secondary | ICD-10-CM | POA: Diagnosis present

## 2019-03-15 DIAGNOSIS — Z96651 Presence of right artificial knee joint: Secondary | ICD-10-CM | POA: Diagnosis not present

## 2019-03-15 DIAGNOSIS — S72491A Other fracture of lower end of right femur, initial encounter for closed fracture: Secondary | ICD-10-CM | POA: Diagnosis present

## 2019-03-15 DIAGNOSIS — M25531 Pain in right wrist: Secondary | ICD-10-CM | POA: Diagnosis not present

## 2019-03-15 DIAGNOSIS — R531 Weakness: Secondary | ICD-10-CM | POA: Diagnosis not present

## 2019-03-15 DIAGNOSIS — Z888 Allergy status to other drugs, medicaments and biological substances status: Secondary | ICD-10-CM | POA: Diagnosis not present

## 2019-03-15 DIAGNOSIS — J349 Unspecified disorder of nose and nasal sinuses: Secondary | ICD-10-CM | POA: Diagnosis not present

## 2019-03-15 DIAGNOSIS — Z880 Allergy status to penicillin: Secondary | ICD-10-CM | POA: Diagnosis not present

## 2019-03-15 DIAGNOSIS — S199XXA Unspecified injury of neck, initial encounter: Secondary | ICD-10-CM | POA: Diagnosis not present

## 2019-03-15 DIAGNOSIS — Z4789 Encounter for other orthopedic aftercare: Secondary | ICD-10-CM | POA: Diagnosis not present

## 2019-03-15 DIAGNOSIS — S59901A Unspecified injury of right elbow, initial encounter: Secondary | ICD-10-CM | POA: Diagnosis not present

## 2019-03-15 DIAGNOSIS — J45909 Unspecified asthma, uncomplicated: Secondary | ICD-10-CM | POA: Diagnosis present

## 2019-03-15 DIAGNOSIS — Y92002 Bathroom of unspecified non-institutional (private) residence single-family (private) house as the place of occurrence of the external cause: Secondary | ICD-10-CM | POA: Diagnosis not present

## 2019-03-15 DIAGNOSIS — M8580 Other specified disorders of bone density and structure, unspecified site: Secondary | ICD-10-CM | POA: Diagnosis present

## 2019-03-15 DIAGNOSIS — Z66 Do not resuscitate: Secondary | ICD-10-CM | POA: Diagnosis present

## 2019-03-15 DIAGNOSIS — S99911A Unspecified injury of right ankle, initial encounter: Secondary | ICD-10-CM | POA: Diagnosis not present

## 2019-03-15 DIAGNOSIS — E11610 Type 2 diabetes mellitus with diabetic neuropathic arthropathy: Secondary | ICD-10-CM | POA: Diagnosis present

## 2019-03-15 DIAGNOSIS — M7989 Other specified soft tissue disorders: Secondary | ICD-10-CM | POA: Diagnosis not present

## 2019-03-15 DIAGNOSIS — Z885 Allergy status to narcotic agent status: Secondary | ICD-10-CM | POA: Diagnosis not present

## 2019-03-15 LAB — RESPIRATORY PANEL BY RT PCR (FLU A&B, COVID)
Influenza A by PCR: NEGATIVE
Influenza B by PCR: NEGATIVE
SARS Coronavirus 2 by RT PCR: NEGATIVE

## 2019-03-18 ENCOUNTER — Encounter: Payer: Self-pay | Admitting: Family Medicine

## 2019-03-18 DIAGNOSIS — Z6830 Body mass index (BMI) 30.0-30.9, adult: Secondary | ICD-10-CM | POA: Diagnosis not present

## 2019-03-18 DIAGNOSIS — I639 Cerebral infarction, unspecified: Secondary | ICD-10-CM | POA: Diagnosis not present

## 2019-03-18 DIAGNOSIS — T84418A Breakdown (mechanical) of other internal orthopedic devices, implants and grafts, initial encounter: Secondary | ICD-10-CM | POA: Diagnosis not present

## 2019-03-18 DIAGNOSIS — R5381 Other malaise: Secondary | ICD-10-CM | POA: Diagnosis present

## 2019-03-18 DIAGNOSIS — Z96653 Presence of artificial knee joint, bilateral: Secondary | ICD-10-CM | POA: Diagnosis present

## 2019-03-18 DIAGNOSIS — R0789 Other chest pain: Secondary | ICD-10-CM | POA: Diagnosis not present

## 2019-03-18 DIAGNOSIS — E8809 Other disorders of plasma-protein metabolism, not elsewhere classified: Secondary | ICD-10-CM | POA: Diagnosis present

## 2019-03-18 DIAGNOSIS — Z743 Need for continuous supervision: Secondary | ICD-10-CM | POA: Diagnosis not present

## 2019-03-18 DIAGNOSIS — R918 Other nonspecific abnormal finding of lung field: Secondary | ICD-10-CM | POA: Diagnosis not present

## 2019-03-18 DIAGNOSIS — S72491A Other fracture of lower end of right femur, initial encounter for closed fracture: Secondary | ICD-10-CM | POA: Diagnosis not present

## 2019-03-18 DIAGNOSIS — R0602 Shortness of breath: Secondary | ICD-10-CM | POA: Diagnosis not present

## 2019-03-18 DIAGNOSIS — F39 Unspecified mood [affective] disorder: Secondary | ICD-10-CM | POA: Diagnosis present

## 2019-03-18 DIAGNOSIS — W1839XA Other fall on same level, initial encounter: Secondary | ICD-10-CM | POA: Diagnosis not present

## 2019-03-18 DIAGNOSIS — S72409D Unspecified fracture of lower end of unspecified femur, subsequent encounter for closed fracture with routine healing: Secondary | ICD-10-CM | POA: Diagnosis not present

## 2019-03-18 DIAGNOSIS — Z8711 Personal history of peptic ulcer disease: Secondary | ICD-10-CM | POA: Diagnosis not present

## 2019-03-18 DIAGNOSIS — H47619 Cortical blindness, unspecified side of brain: Secondary | ICD-10-CM | POA: Diagnosis not present

## 2019-03-18 DIAGNOSIS — M25474 Effusion, right foot: Secondary | ICD-10-CM | POA: Diagnosis not present

## 2019-03-18 DIAGNOSIS — I082 Rheumatic disorders of both aortic and tricuspid valves: Secondary | ICD-10-CM | POA: Diagnosis present

## 2019-03-18 DIAGNOSIS — E119 Type 2 diabetes mellitus without complications: Secondary | ICD-10-CM | POA: Diagnosis present

## 2019-03-18 DIAGNOSIS — R531 Weakness: Secondary | ICD-10-CM | POA: Diagnosis not present

## 2019-03-18 DIAGNOSIS — R5383 Other fatigue: Secondary | ICD-10-CM | POA: Diagnosis present

## 2019-03-18 DIAGNOSIS — M6281 Muscle weakness (generalized): Secondary | ICD-10-CM | POA: Diagnosis not present

## 2019-03-18 DIAGNOSIS — I82409 Acute embolism and thrombosis of unspecified deep veins of unspecified lower extremity: Secondary | ICD-10-CM | POA: Diagnosis not present

## 2019-03-18 DIAGNOSIS — R609 Edema, unspecified: Secondary | ICD-10-CM | POA: Diagnosis not present

## 2019-03-18 DIAGNOSIS — E43 Unspecified severe protein-calorie malnutrition: Secondary | ICD-10-CM | POA: Diagnosis present

## 2019-03-18 DIAGNOSIS — M25571 Pain in right ankle and joints of right foot: Secondary | ICD-10-CM | POA: Diagnosis not present

## 2019-03-18 DIAGNOSIS — K2100 Gastro-esophageal reflux disease with esophagitis, without bleeding: Secondary | ICD-10-CM | POA: Diagnosis present

## 2019-03-18 DIAGNOSIS — I201 Angina pectoris with documented spasm: Secondary | ICD-10-CM | POA: Diagnosis not present

## 2019-03-18 DIAGNOSIS — Z96698 Presence of other orthopedic joint implants: Secondary | ICD-10-CM | POA: Diagnosis not present

## 2019-03-18 DIAGNOSIS — Z7722 Contact with and (suspected) exposure to environmental tobacco smoke (acute) (chronic): Secondary | ICD-10-CM | POA: Diagnosis not present

## 2019-03-18 DIAGNOSIS — S098XXA Other specified injuries of head, initial encounter: Secondary | ICD-10-CM | POA: Diagnosis not present

## 2019-03-18 DIAGNOSIS — Z79899 Other long term (current) drug therapy: Secondary | ICD-10-CM | POA: Diagnosis not present

## 2019-03-18 DIAGNOSIS — Z7902 Long term (current) use of antithrombotics/antiplatelets: Secondary | ICD-10-CM | POA: Diagnosis not present

## 2019-03-18 DIAGNOSIS — R61 Generalized hyperhidrosis: Secondary | ICD-10-CM | POA: Diagnosis not present

## 2019-03-18 DIAGNOSIS — R54 Age-related physical debility: Secondary | ICD-10-CM | POA: Diagnosis not present

## 2019-03-18 DIAGNOSIS — R911 Solitary pulmonary nodule: Secondary | ICD-10-CM | POA: Diagnosis not present

## 2019-03-18 DIAGNOSIS — Z96651 Presence of right artificial knee joint: Secondary | ICD-10-CM | POA: Diagnosis not present

## 2019-03-18 DIAGNOSIS — Z20822 Contact with and (suspected) exposure to covid-19: Secondary | ICD-10-CM | POA: Diagnosis present

## 2019-03-18 DIAGNOSIS — Z7401 Bed confinement status: Secondary | ICD-10-CM | POA: Diagnosis not present

## 2019-03-18 DIAGNOSIS — E669 Obesity, unspecified: Secondary | ICD-10-CM | POA: Diagnosis present

## 2019-03-18 DIAGNOSIS — I10 Essential (primary) hypertension: Secondary | ICD-10-CM | POA: Diagnosis present

## 2019-03-18 DIAGNOSIS — J811 Chronic pulmonary edema: Secondary | ICD-10-CM | POA: Diagnosis not present

## 2019-03-18 DIAGNOSIS — M79671 Pain in right foot: Secondary | ICD-10-CM | POA: Diagnosis not present

## 2019-03-18 DIAGNOSIS — S72491D Other fracture of lower end of right femur, subsequent encounter for closed fracture with routine healing: Secondary | ICD-10-CM | POA: Diagnosis not present

## 2019-03-18 DIAGNOSIS — R296 Repeated falls: Secondary | ICD-10-CM | POA: Diagnosis not present

## 2019-03-18 DIAGNOSIS — Z8673 Personal history of transient ischemic attack (TIA), and cerebral infarction without residual deficits: Secondary | ICD-10-CM | POA: Diagnosis not present

## 2019-03-18 DIAGNOSIS — I214 Non-ST elevation (NSTEMI) myocardial infarction: Secondary | ICD-10-CM | POA: Diagnosis present

## 2019-03-18 DIAGNOSIS — R7989 Other specified abnormal findings of blood chemistry: Secondary | ICD-10-CM | POA: Diagnosis not present

## 2019-03-18 DIAGNOSIS — R262 Difficulty in walking, not elsewhere classified: Secondary | ICD-10-CM | POA: Diagnosis not present

## 2019-03-18 DIAGNOSIS — R279 Unspecified lack of coordination: Secondary | ICD-10-CM | POA: Diagnosis not present

## 2019-03-18 DIAGNOSIS — M25471 Effusion, right ankle: Secondary | ICD-10-CM | POA: Diagnosis not present

## 2019-03-18 DIAGNOSIS — K3189 Other diseases of stomach and duodenum: Secondary | ICD-10-CM | POA: Diagnosis not present

## 2019-03-18 DIAGNOSIS — J90 Pleural effusion, not elsewhere classified: Secondary | ICD-10-CM | POA: Diagnosis not present

## 2019-03-18 DIAGNOSIS — I2510 Atherosclerotic heart disease of native coronary artery without angina pectoris: Secondary | ICD-10-CM | POA: Diagnosis present

## 2019-03-18 DIAGNOSIS — I313 Pericardial effusion (noninflammatory): Secondary | ICD-10-CM | POA: Diagnosis not present

## 2019-03-18 DIAGNOSIS — Z981 Arthrodesis status: Secondary | ICD-10-CM | POA: Diagnosis not present

## 2019-03-18 DIAGNOSIS — Z4789 Encounter for other orthopedic aftercare: Secondary | ICD-10-CM | POA: Diagnosis not present

## 2019-03-18 DIAGNOSIS — R0902 Hypoxemia: Secondary | ICD-10-CM | POA: Diagnosis not present

## 2019-03-18 DIAGNOSIS — M6250 Muscle wasting and atrophy, not elsewhere classified, unspecified site: Secondary | ICD-10-CM | POA: Diagnosis not present

## 2019-03-18 DIAGNOSIS — I469 Cardiac arrest, cause unspecified: Secondary | ICD-10-CM | POA: Diagnosis not present

## 2019-03-18 DIAGNOSIS — Z86718 Personal history of other venous thrombosis and embolism: Secondary | ICD-10-CM | POA: Diagnosis not present

## 2019-03-18 DIAGNOSIS — J45909 Unspecified asthma, uncomplicated: Secondary | ICD-10-CM | POA: Diagnosis present

## 2019-03-18 DIAGNOSIS — G43909 Migraine, unspecified, not intractable, without status migrainosus: Secondary | ICD-10-CM | POA: Diagnosis present

## 2019-03-18 DIAGNOSIS — M9711XD Periprosthetic fracture around internal prosthetic right knee joint, subsequent encounter: Secondary | ICD-10-CM | POA: Diagnosis not present

## 2019-03-18 DIAGNOSIS — K449 Diaphragmatic hernia without obstruction or gangrene: Secondary | ICD-10-CM | POA: Diagnosis present

## 2019-03-18 DIAGNOSIS — I200 Unstable angina: Secondary | ICD-10-CM | POA: Diagnosis not present

## 2019-03-18 DIAGNOSIS — G4733 Obstructive sleep apnea (adult) (pediatric): Secondary | ICD-10-CM | POA: Diagnosis present

## 2019-03-18 DIAGNOSIS — J9811 Atelectasis: Secondary | ICD-10-CM | POA: Diagnosis not present

## 2019-03-18 DIAGNOSIS — G4489 Other headache syndrome: Secondary | ICD-10-CM | POA: Diagnosis not present

## 2019-03-18 DIAGNOSIS — E785 Hyperlipidemia, unspecified: Secondary | ICD-10-CM | POA: Diagnosis present

## 2019-03-18 DIAGNOSIS — R079 Chest pain, unspecified: Secondary | ICD-10-CM | POA: Diagnosis not present

## 2019-03-18 MED ORDER — DEXTROSE 50 % IV SOLN
12.50 | INTRAVENOUS | Status: DC
Start: ? — End: 2019-03-18

## 2019-03-18 MED ORDER — FLUOXETINE HCL 20 MG PO CAPS
20.00 | ORAL_CAPSULE | ORAL | Status: DC
Start: 2019-03-19 — End: 2019-03-18

## 2019-03-18 MED ORDER — LORATADINE 10 MG PO TABS
10.00 | ORAL_TABLET | ORAL | Status: DC
Start: 2019-03-19 — End: 2019-03-18

## 2019-03-18 MED ORDER — HYDROMORPHONE HCL 1 MG/ML IJ SOLN
0.25 | INTRAMUSCULAR | Status: DC
Start: ? — End: 2019-03-18

## 2019-03-18 MED ORDER — GLUCAGON (RDNA) 1 MG IJ KIT
1.00 | PACK | INTRAMUSCULAR | Status: DC
Start: ? — End: 2019-03-18

## 2019-03-18 MED ORDER — CHOLECALCIFEROL 50 MCG (2000 UT) PO TABS
2000.00 | ORAL_TABLET | ORAL | Status: DC
Start: 2019-03-19 — End: 2019-03-18

## 2019-03-18 MED ORDER — POLYETHYLENE GLYCOL 3350 17 GM/SCOOP PO POWD
17.00 | ORAL | Status: DC
Start: ? — End: 2019-03-18

## 2019-03-18 MED ORDER — FERROUS FUMARATE 324 (106 FE) MG PO TABS
324.00 | ORAL_TABLET | ORAL | Status: DC
Start: 2019-03-19 — End: 2019-03-18

## 2019-03-18 MED ORDER — CLOPIDOGREL BISULFATE 75 MG PO TABS
75.00 | ORAL_TABLET | ORAL | Status: DC
Start: 2019-03-19 — End: 2019-03-18

## 2019-03-18 MED ORDER — ONDANSETRON HCL 4 MG/2ML IJ SOLN
4.00 | INTRAMUSCULAR | Status: DC
Start: ? — End: 2019-03-18

## 2019-03-18 MED ORDER — ROSUVASTATIN CALCIUM 10 MG PO TABS
10.00 | ORAL_TABLET | ORAL | Status: DC
Start: 2019-03-19 — End: 2019-03-18

## 2019-03-18 MED ORDER — ACETAMINOPHEN 325 MG PO TABS
975.00 | ORAL_TABLET | ORAL | Status: DC
Start: 2019-03-18 — End: 2019-03-18

## 2019-03-18 MED ORDER — MELATONIN 3 MG PO TABS
3.00 | ORAL_TABLET | ORAL | Status: DC
Start: ? — End: 2019-03-18

## 2019-03-18 MED ORDER — SODIUM CHLORIDE FLUSH 0.9 % IV SOLN
5.00 | INTRAVENOUS | Status: DC
Start: 2019-03-18 — End: 2019-03-18

## 2019-03-18 MED ORDER — CALCIUM CARBONATE-VITAMIN D 600-400 MG-UNIT PO TABS
1.00 | ORAL_TABLET | ORAL | Status: DC
Start: 2019-03-19 — End: 2019-03-18

## 2019-03-18 MED ORDER — INSULIN LISPRO 100 UNIT/ML ~~LOC~~ SOLN
0.00 | SUBCUTANEOUS | Status: DC
Start: 2019-03-18 — End: 2019-03-18

## 2019-03-18 MED ORDER — ENOXAPARIN SODIUM 40 MG/0.4ML ~~LOC~~ SOLN
40.00 | SUBCUTANEOUS | Status: DC
Start: 2019-03-19 — End: 2019-03-18

## 2019-03-18 MED ORDER — PANTOPRAZOLE SODIUM 40 MG PO TBEC
40.00 | DELAYED_RELEASE_TABLET | ORAL | Status: DC
Start: 2019-03-19 — End: 2019-03-18

## 2019-03-18 MED ORDER — SENNOSIDES-DOCUSATE SODIUM 8.6-50 MG PO TABS
2.00 | ORAL_TABLET | ORAL | Status: DC
Start: 2019-03-18 — End: 2019-03-18

## 2019-03-18 MED ORDER — OMEGA-3 1000 MG PO CAPS
1.00 | ORAL_CAPSULE | ORAL | Status: DC
Start: 2019-03-18 — End: 2019-03-18

## 2019-03-21 DIAGNOSIS — H47619 Cortical blindness, unspecified side of brain: Secondary | ICD-10-CM | POA: Diagnosis not present

## 2019-03-21 DIAGNOSIS — I10 Essential (primary) hypertension: Secondary | ICD-10-CM | POA: Diagnosis not present

## 2019-03-21 DIAGNOSIS — E119 Type 2 diabetes mellitus without complications: Secondary | ICD-10-CM | POA: Diagnosis not present

## 2019-03-21 DIAGNOSIS — I639 Cerebral infarction, unspecified: Secondary | ICD-10-CM | POA: Diagnosis not present

## 2019-03-21 DIAGNOSIS — S72409D Unspecified fracture of lower end of unspecified femur, subsequent encounter for closed fracture with routine healing: Secondary | ICD-10-CM | POA: Diagnosis not present

## 2019-03-23 ENCOUNTER — Inpatient Hospital Stay: Payer: Medicare Other | Admitting: Family Medicine

## 2019-03-23 DIAGNOSIS — R0789 Other chest pain: Secondary | ICD-10-CM | POA: Diagnosis not present

## 2019-04-12 DIAGNOSIS — K449 Diaphragmatic hernia without obstruction or gangrene: Secondary | ICD-10-CM | POA: Diagnosis not present

## 2019-04-12 DIAGNOSIS — R911 Solitary pulmonary nodule: Secondary | ICD-10-CM | POA: Diagnosis not present

## 2019-04-12 DIAGNOSIS — J811 Chronic pulmonary edema: Secondary | ICD-10-CM | POA: Diagnosis not present

## 2019-04-12 DIAGNOSIS — R0602 Shortness of breath: Secondary | ICD-10-CM | POA: Diagnosis not present

## 2019-04-12 DIAGNOSIS — Z20822 Contact with and (suspected) exposure to covid-19: Secondary | ICD-10-CM | POA: Diagnosis not present

## 2019-04-12 DIAGNOSIS — R079 Chest pain, unspecified: Secondary | ICD-10-CM | POA: Diagnosis not present

## 2019-04-12 DIAGNOSIS — J90 Pleural effusion, not elsewhere classified: Secondary | ICD-10-CM | POA: Diagnosis not present

## 2019-04-12 DIAGNOSIS — R918 Other nonspecific abnormal finding of lung field: Secondary | ICD-10-CM | POA: Diagnosis not present

## 2019-04-15 DIAGNOSIS — K449 Diaphragmatic hernia without obstruction or gangrene: Secondary | ICD-10-CM | POA: Diagnosis not present

## 2019-04-21 DIAGNOSIS — I10 Essential (primary) hypertension: Secondary | ICD-10-CM | POA: Diagnosis not present

## 2019-04-21 DIAGNOSIS — I82409 Acute embolism and thrombosis of unspecified deep veins of unspecified lower extremity: Secondary | ICD-10-CM | POA: Diagnosis not present

## 2019-04-21 DIAGNOSIS — R079 Chest pain, unspecified: Secondary | ICD-10-CM | POA: Diagnosis not present

## 2019-04-21 DIAGNOSIS — R7989 Other specified abnormal findings of blood chemistry: Secondary | ICD-10-CM | POA: Diagnosis not present

## 2019-04-21 DIAGNOSIS — E785 Hyperlipidemia, unspecified: Secondary | ICD-10-CM | POA: Diagnosis not present

## 2019-04-21 DIAGNOSIS — K449 Diaphragmatic hernia without obstruction or gangrene: Secondary | ICD-10-CM | POA: Diagnosis not present

## 2019-04-21 DIAGNOSIS — J9811 Atelectasis: Secondary | ICD-10-CM | POA: Diagnosis not present

## 2019-05-05 DIAGNOSIS — R54 Age-related physical debility: Secondary | ICD-10-CM | POA: Diagnosis not present

## 2019-05-05 DIAGNOSIS — K449 Diaphragmatic hernia without obstruction or gangrene: Secondary | ICD-10-CM | POA: Diagnosis not present

## 2019-05-05 DIAGNOSIS — Z7722 Contact with and (suspected) exposure to environmental tobacco smoke (acute) (chronic): Secondary | ICD-10-CM | POA: Diagnosis not present

## 2019-05-05 DIAGNOSIS — Z86718 Personal history of other venous thrombosis and embolism: Secondary | ICD-10-CM | POA: Diagnosis not present

## 2019-05-06 ENCOUNTER — Encounter: Payer: Self-pay | Admitting: Family Medicine

## 2019-05-07 ENCOUNTER — Encounter: Payer: Self-pay | Admitting: Family Medicine

## 2019-05-10 DIAGNOSIS — R609 Edema, unspecified: Secondary | ICD-10-CM | POA: Diagnosis not present

## 2019-05-13 DIAGNOSIS — S72491A Other fracture of lower end of right femur, initial encounter for closed fracture: Secondary | ICD-10-CM | POA: Diagnosis not present

## 2019-05-13 DIAGNOSIS — W1839XA Other fall on same level, initial encounter: Secondary | ICD-10-CM | POA: Diagnosis not present

## 2019-05-13 DIAGNOSIS — M25571 Pain in right ankle and joints of right foot: Secondary | ICD-10-CM | POA: Diagnosis not present

## 2019-05-13 DIAGNOSIS — T84418A Breakdown (mechanical) of other internal orthopedic devices, implants and grafts, initial encounter: Secondary | ICD-10-CM | POA: Diagnosis not present

## 2019-05-13 DIAGNOSIS — Z96651 Presence of right artificial knee joint: Secondary | ICD-10-CM | POA: Diagnosis not present

## 2019-05-13 DIAGNOSIS — Z981 Arthrodesis status: Secondary | ICD-10-CM | POA: Diagnosis not present

## 2019-05-13 DIAGNOSIS — M9711XD Periprosthetic fracture around internal prosthetic right knee joint, subsequent encounter: Secondary | ICD-10-CM | POA: Diagnosis not present

## 2019-06-12 DIAGNOSIS — J811 Chronic pulmonary edema: Secondary | ICD-10-CM | POA: Diagnosis not present

## 2019-06-12 DIAGNOSIS — R079 Chest pain, unspecified: Secondary | ICD-10-CM | POA: Diagnosis not present

## 2019-06-12 DIAGNOSIS — I082 Rheumatic disorders of both aortic and tricuspid valves: Secondary | ICD-10-CM | POA: Diagnosis not present

## 2019-06-12 DIAGNOSIS — E119 Type 2 diabetes mellitus without complications: Secondary | ICD-10-CM | POA: Diagnosis not present

## 2019-06-12 DIAGNOSIS — J90 Pleural effusion, not elsewhere classified: Secondary | ICD-10-CM | POA: Diagnosis not present

## 2019-06-12 DIAGNOSIS — K3189 Other diseases of stomach and duodenum: Secondary | ICD-10-CM | POA: Diagnosis not present

## 2019-06-12 DIAGNOSIS — R918 Other nonspecific abnormal finding of lung field: Secondary | ICD-10-CM | POA: Diagnosis not present

## 2019-06-12 DIAGNOSIS — I313 Pericardial effusion (noninflammatory): Secondary | ICD-10-CM | POA: Diagnosis not present

## 2019-06-12 DIAGNOSIS — I2510 Atherosclerotic heart disease of native coronary artery without angina pectoris: Secondary | ICD-10-CM | POA: Diagnosis not present

## 2019-06-12 DIAGNOSIS — R61 Generalized hyperhidrosis: Secondary | ICD-10-CM | POA: Diagnosis not present

## 2019-06-12 DIAGNOSIS — R0602 Shortness of breath: Secondary | ICD-10-CM | POA: Diagnosis not present

## 2019-06-12 DIAGNOSIS — K449 Diaphragmatic hernia without obstruction or gangrene: Secondary | ICD-10-CM | POA: Diagnosis not present

## 2019-06-12 DIAGNOSIS — R0789 Other chest pain: Secondary | ICD-10-CM | POA: Diagnosis not present

## 2019-06-12 DIAGNOSIS — E43 Unspecified severe protein-calorie malnutrition: Secondary | ICD-10-CM | POA: Diagnosis not present

## 2019-06-12 DIAGNOSIS — I214 Non-ST elevation (NSTEMI) myocardial infarction: Secondary | ICD-10-CM | POA: Diagnosis not present

## 2019-06-12 DIAGNOSIS — I10 Essential (primary) hypertension: Secondary | ICD-10-CM | POA: Diagnosis not present

## 2019-06-13 DIAGNOSIS — M6281 Muscle weakness (generalized): Secondary | ICD-10-CM | POA: Diagnosis not present

## 2019-06-13 DIAGNOSIS — R311 Benign essential microscopic hematuria: Secondary | ICD-10-CM | POA: Diagnosis not present

## 2019-06-13 DIAGNOSIS — Z8711 Personal history of peptic ulcer disease: Secondary | ICD-10-CM | POA: Diagnosis not present

## 2019-06-13 DIAGNOSIS — I25118 Atherosclerotic heart disease of native coronary artery with other forms of angina pectoris: Secondary | ICD-10-CM | POA: Diagnosis not present

## 2019-06-13 DIAGNOSIS — R079 Chest pain, unspecified: Secondary | ICD-10-CM | POA: Diagnosis not present

## 2019-06-13 DIAGNOSIS — R5383 Other fatigue: Secondary | ICD-10-CM | POA: Diagnosis present

## 2019-06-13 DIAGNOSIS — Z96653 Presence of artificial knee joint, bilateral: Secondary | ICD-10-CM | POA: Diagnosis present

## 2019-06-13 DIAGNOSIS — E119 Type 2 diabetes mellitus without complications: Secondary | ICD-10-CM | POA: Diagnosis present

## 2019-06-13 DIAGNOSIS — E8809 Other disorders of plasma-protein metabolism, not elsewhere classified: Secondary | ICD-10-CM | POA: Diagnosis present

## 2019-06-13 DIAGNOSIS — R262 Difficulty in walking, not elsewhere classified: Secondary | ICD-10-CM | POA: Diagnosis not present

## 2019-06-13 DIAGNOSIS — K2100 Gastro-esophageal reflux disease with esophagitis, without bleeding: Secondary | ICD-10-CM | POA: Diagnosis present

## 2019-06-13 DIAGNOSIS — I10 Essential (primary) hypertension: Secondary | ICD-10-CM | POA: Diagnosis present

## 2019-06-13 DIAGNOSIS — G43909 Migraine, unspecified, not intractable, without status migrainosus: Secondary | ICD-10-CM | POA: Diagnosis present

## 2019-06-13 DIAGNOSIS — R531 Weakness: Secondary | ICD-10-CM | POA: Diagnosis not present

## 2019-06-13 DIAGNOSIS — Z7401 Bed confinement status: Secondary | ICD-10-CM | POA: Diagnosis not present

## 2019-06-13 DIAGNOSIS — I082 Rheumatic disorders of both aortic and tricuspid valves: Secondary | ICD-10-CM | POA: Diagnosis present

## 2019-06-13 DIAGNOSIS — S72491D Other fracture of lower end of right femur, subsequent encounter for closed fracture with routine healing: Secondary | ICD-10-CM | POA: Diagnosis not present

## 2019-06-13 DIAGNOSIS — R0789 Other chest pain: Secondary | ICD-10-CM | POA: Diagnosis not present

## 2019-06-13 DIAGNOSIS — Z8673 Personal history of transient ischemic attack (TIA), and cerebral infarction without residual deficits: Secondary | ICD-10-CM | POA: Diagnosis not present

## 2019-06-13 DIAGNOSIS — J45909 Unspecified asthma, uncomplicated: Secondary | ICD-10-CM | POA: Diagnosis present

## 2019-06-13 DIAGNOSIS — E785 Hyperlipidemia, unspecified: Secondary | ICD-10-CM | POA: Diagnosis present

## 2019-06-13 DIAGNOSIS — I358 Other nonrheumatic aortic valve disorders: Secondary | ICD-10-CM | POA: Diagnosis not present

## 2019-06-13 DIAGNOSIS — K449 Diaphragmatic hernia without obstruction or gangrene: Secondary | ICD-10-CM | POA: Diagnosis present

## 2019-06-13 DIAGNOSIS — M6250 Muscle wasting and atrophy, not elsewhere classified, unspecified site: Secondary | ICD-10-CM | POA: Diagnosis not present

## 2019-06-13 DIAGNOSIS — I214 Non-ST elevation (NSTEMI) myocardial infarction: Secondary | ICD-10-CM | POA: Diagnosis present

## 2019-06-13 DIAGNOSIS — Z7902 Long term (current) use of antithrombotics/antiplatelets: Secondary | ICD-10-CM | POA: Diagnosis not present

## 2019-06-13 DIAGNOSIS — E43 Unspecified severe protein-calorie malnutrition: Secondary | ICD-10-CM | POA: Diagnosis not present

## 2019-06-13 DIAGNOSIS — I252 Old myocardial infarction: Secondary | ICD-10-CM | POA: Diagnosis not present

## 2019-06-13 DIAGNOSIS — I6930 Unspecified sequelae of cerebral infarction: Secondary | ICD-10-CM | POA: Diagnosis not present

## 2019-06-13 DIAGNOSIS — F39 Unspecified mood [affective] disorder: Secondary | ICD-10-CM | POA: Diagnosis present

## 2019-06-13 DIAGNOSIS — R9439 Abnormal result of other cardiovascular function study: Secondary | ICD-10-CM | POA: Diagnosis not present

## 2019-06-13 DIAGNOSIS — I2510 Atherosclerotic heart disease of native coronary artery without angina pectoris: Secondary | ICD-10-CM | POA: Diagnosis present

## 2019-06-13 DIAGNOSIS — Z20822 Contact with and (suspected) exposure to covid-19: Secondary | ICD-10-CM | POA: Diagnosis present

## 2019-06-13 DIAGNOSIS — R0602 Shortness of breath: Secondary | ICD-10-CM | POA: Diagnosis not present

## 2019-06-13 DIAGNOSIS — D649 Anemia, unspecified: Secondary | ICD-10-CM | POA: Diagnosis not present

## 2019-06-13 DIAGNOSIS — R5381 Other malaise: Secondary | ICD-10-CM | POA: Diagnosis present

## 2019-06-13 DIAGNOSIS — E669 Obesity, unspecified: Secondary | ICD-10-CM | POA: Diagnosis present

## 2019-06-13 DIAGNOSIS — Z6830 Body mass index (BMI) 30.0-30.9, adult: Secondary | ICD-10-CM | POA: Diagnosis not present

## 2019-06-13 DIAGNOSIS — R296 Repeated falls: Secondary | ICD-10-CM | POA: Diagnosis not present

## 2019-06-13 DIAGNOSIS — G4733 Obstructive sleep apnea (adult) (pediatric): Secondary | ICD-10-CM | POA: Diagnosis present

## 2019-06-13 DIAGNOSIS — Z86718 Personal history of other venous thrombosis and embolism: Secondary | ICD-10-CM | POA: Diagnosis not present

## 2019-06-15 DIAGNOSIS — I340 Nonrheumatic mitral (valve) insufficiency: Secondary | ICD-10-CM | POA: Insufficient documentation

## 2019-06-15 DIAGNOSIS — I350 Nonrheumatic aortic (valve) stenosis: Secondary | ICD-10-CM | POA: Insufficient documentation

## 2019-06-17 DIAGNOSIS — R001 Bradycardia, unspecified: Secondary | ICD-10-CM | POA: Insufficient documentation

## 2019-06-17 DIAGNOSIS — E8809 Other disorders of plasma-protein metabolism, not elsewhere classified: Secondary | ICD-10-CM | POA: Insufficient documentation

## 2019-06-17 DIAGNOSIS — E43 Unspecified severe protein-calorie malnutrition: Secondary | ICD-10-CM | POA: Insufficient documentation

## 2019-06-21 DIAGNOSIS — I2510 Atherosclerotic heart disease of native coronary artery without angina pectoris: Secondary | ICD-10-CM | POA: Diagnosis not present

## 2019-06-21 DIAGNOSIS — Z4789 Encounter for other orthopedic aftercare: Secondary | ICD-10-CM | POA: Diagnosis not present

## 2019-06-21 DIAGNOSIS — R531 Weakness: Secondary | ICD-10-CM | POA: Diagnosis not present

## 2019-06-21 DIAGNOSIS — Z8679 Personal history of other diseases of the circulatory system: Secondary | ICD-10-CM | POA: Diagnosis not present

## 2019-06-22 DIAGNOSIS — R5381 Other malaise: Secondary | ICD-10-CM | POA: Diagnosis not present

## 2019-06-23 ENCOUNTER — Telehealth: Payer: Self-pay | Admitting: Family Medicine

## 2019-06-23 NOTE — Progress Notes (Signed)
  Chronic Care Management   Note  06/23/2019 Name: Lydia King MRN: [id number] DOB: [birth year]  Lydia King is a 86 y.o. year old female who is a primary care patient of Tower, Wynelle Fanny, MD. I reached out to Stephan Minister by phone today in response to a referral sent by [name]'s PCP, Tower, Wynelle Fanny, MD.   [name] was given information about Chronic Care Management services today including:  1. CCM service includes personalized support from designated clinical staff supervised by her physician, including individualized plan of care and coordination with other care providers 2. 24/7 contact phone numbers for assistance for urgent and routine care needs. 3. Service will only be billed when office clinical staff spend 20 minutes or more in a month to coordinate care. 4. Only one practitioner may furnish and bill the service in a calendar month. 5. The patient may stop CCM services at any time (effective at the end of the month) by phone call to the office staff.   Patient wishes to consider information provided and/or speak with a member of the care team before deciding about enrollment in care management services.   This note is not being shared with the patient for the following reason: To respect privacy (The patient or proxy has requested that the information not be shared).  Follow up plan:   Earney [place] Upstream Scheduler

## 2019-06-24 DIAGNOSIS — K219 Gastro-esophageal reflux disease without esophagitis: Secondary | ICD-10-CM | POA: Diagnosis not present

## 2019-06-24 DIAGNOSIS — R109 Unspecified abdominal pain: Secondary | ICD-10-CM | POA: Diagnosis not present

## 2019-06-28 DIAGNOSIS — I214 Non-ST elevation (NSTEMI) myocardial infarction: Secondary | ICD-10-CM | POA: Diagnosis not present

## 2019-06-28 DIAGNOSIS — R311 Benign essential microscopic hematuria: Secondary | ICD-10-CM | POA: Diagnosis not present

## 2019-06-28 DIAGNOSIS — M6281 Muscle weakness (generalized): Secondary | ICD-10-CM | POA: Diagnosis not present

## 2019-06-28 DIAGNOSIS — U071 COVID-19: Secondary | ICD-10-CM | POA: Diagnosis not present

## 2019-06-28 DIAGNOSIS — I10 Essential (primary) hypertension: Secondary | ICD-10-CM | POA: Diagnosis not present

## 2019-06-28 DIAGNOSIS — I252 Old myocardial infarction: Secondary | ICD-10-CM | POA: Diagnosis not present

## 2019-06-28 DIAGNOSIS — R918 Other nonspecific abnormal finding of lung field: Secondary | ICD-10-CM | POA: Diagnosis not present

## 2019-06-28 DIAGNOSIS — R0902 Hypoxemia: Secondary | ICD-10-CM | POA: Diagnosis not present

## 2019-06-28 DIAGNOSIS — R072 Precordial pain: Secondary | ICD-10-CM | POA: Diagnosis not present

## 2019-06-28 DIAGNOSIS — S72491D Other fracture of lower end of right femur, subsequent encounter for closed fracture with routine healing: Secondary | ICD-10-CM | POA: Diagnosis not present

## 2019-06-28 DIAGNOSIS — D649 Anemia, unspecified: Secondary | ICD-10-CM | POA: Diagnosis not present

## 2019-06-28 DIAGNOSIS — R296 Repeated falls: Secondary | ICD-10-CM | POA: Diagnosis not present

## 2019-06-28 DIAGNOSIS — J90 Pleural effusion, not elsewhere classified: Secondary | ICD-10-CM | POA: Diagnosis not present

## 2019-06-28 DIAGNOSIS — R0689 Other abnormalities of breathing: Secondary | ICD-10-CM | POA: Diagnosis not present

## 2019-06-28 DIAGNOSIS — R262 Difficulty in walking, not elsewhere classified: Secondary | ICD-10-CM | POA: Diagnosis not present

## 2019-06-28 DIAGNOSIS — I6930 Unspecified sequelae of cerebral infarction: Secondary | ICD-10-CM | POA: Diagnosis not present

## 2019-06-28 DIAGNOSIS — R079 Chest pain, unspecified: Secondary | ICD-10-CM | POA: Diagnosis not present

## 2019-06-28 DIAGNOSIS — E43 Unspecified severe protein-calorie malnutrition: Secondary | ICD-10-CM | POA: Diagnosis not present

## 2019-06-28 DIAGNOSIS — E119 Type 2 diabetes mellitus without complications: Secondary | ICD-10-CM | POA: Diagnosis not present

## 2019-06-28 DIAGNOSIS — J810 Acute pulmonary edema: Secondary | ICD-10-CM | POA: Diagnosis not present

## 2019-06-28 DIAGNOSIS — R0789 Other chest pain: Secondary | ICD-10-CM | POA: Diagnosis not present

## 2019-07-03 DIAGNOSIS — R0602 Shortness of breath: Secondary | ICD-10-CM | POA: Diagnosis not present

## 2019-07-03 DIAGNOSIS — J90 Pleural effusion, not elsewhere classified: Secondary | ICD-10-CM | POA: Diagnosis not present

## 2019-07-03 DIAGNOSIS — J810 Acute pulmonary edema: Secondary | ICD-10-CM | POA: Diagnosis not present

## 2019-07-03 DIAGNOSIS — R918 Other nonspecific abnormal finding of lung field: Secondary | ICD-10-CM | POA: Diagnosis not present

## 2019-07-03 DIAGNOSIS — I509 Heart failure, unspecified: Secondary | ICD-10-CM | POA: Insufficient documentation

## 2019-07-03 DIAGNOSIS — R079 Chest pain, unspecified: Secondary | ICD-10-CM | POA: Diagnosis not present

## 2019-07-03 DIAGNOSIS — R072 Precordial pain: Secondary | ICD-10-CM | POA: Diagnosis not present

## 2019-07-04 DIAGNOSIS — R0602 Shortness of breath: Secondary | ICD-10-CM | POA: Diagnosis not present

## 2019-07-04 DIAGNOSIS — E43 Unspecified severe protein-calorie malnutrition: Secondary | ICD-10-CM | POA: Diagnosis not present

## 2019-07-04 DIAGNOSIS — I509 Heart failure, unspecified: Secondary | ICD-10-CM | POA: Diagnosis not present

## 2019-07-04 DIAGNOSIS — I5021 Acute systolic (congestive) heart failure: Secondary | ICD-10-CM | POA: Diagnosis not present

## 2019-07-04 DIAGNOSIS — I10 Essential (primary) hypertension: Secondary | ICD-10-CM | POA: Diagnosis not present

## 2019-07-04 DIAGNOSIS — E119 Type 2 diabetes mellitus without complications: Secondary | ICD-10-CM | POA: Diagnosis not present

## 2019-07-04 DIAGNOSIS — R079 Chest pain, unspecified: Secondary | ICD-10-CM | POA: Diagnosis not present

## 2019-07-04 DIAGNOSIS — R0789 Other chest pain: Secondary | ICD-10-CM | POA: Diagnosis not present

## 2019-07-05 DIAGNOSIS — R079 Chest pain, unspecified: Secondary | ICD-10-CM | POA: Diagnosis not present

## 2019-07-05 DIAGNOSIS — I509 Heart failure, unspecified: Secondary | ICD-10-CM | POA: Diagnosis not present

## 2019-07-05 DIAGNOSIS — I5021 Acute systolic (congestive) heart failure: Secondary | ICD-10-CM | POA: Diagnosis not present

## 2019-07-05 DIAGNOSIS — I10 Essential (primary) hypertension: Secondary | ICD-10-CM | POA: Diagnosis not present

## 2019-07-05 DIAGNOSIS — E43 Unspecified severe protein-calorie malnutrition: Secondary | ICD-10-CM | POA: Diagnosis not present

## 2019-07-05 DIAGNOSIS — E119 Type 2 diabetes mellitus without complications: Secondary | ICD-10-CM | POA: Diagnosis not present

## 2019-07-05 DIAGNOSIS — R0789 Other chest pain: Secondary | ICD-10-CM | POA: Diagnosis not present

## 2019-07-05 DIAGNOSIS — R0602 Shortness of breath: Secondary | ICD-10-CM | POA: Diagnosis not present

## 2019-07-06 DIAGNOSIS — I10 Essential (primary) hypertension: Secondary | ICD-10-CM | POA: Diagnosis not present

## 2019-07-06 DIAGNOSIS — I509 Heart failure, unspecified: Secondary | ICD-10-CM | POA: Diagnosis not present

## 2019-07-06 DIAGNOSIS — R079 Chest pain, unspecified: Secondary | ICD-10-CM | POA: Diagnosis not present

## 2019-07-06 DIAGNOSIS — E43 Unspecified severe protein-calorie malnutrition: Secondary | ICD-10-CM | POA: Diagnosis not present

## 2019-07-06 DIAGNOSIS — I5021 Acute systolic (congestive) heart failure: Secondary | ICD-10-CM | POA: Diagnosis not present

## 2019-07-06 DIAGNOSIS — Z96651 Presence of right artificial knee joint: Secondary | ICD-10-CM | POA: Diagnosis not present

## 2019-07-06 DIAGNOSIS — E119 Type 2 diabetes mellitus without complications: Secondary | ICD-10-CM | POA: Diagnosis not present

## 2019-07-06 DIAGNOSIS — M9711XA Periprosthetic fracture around internal prosthetic right knee joint, initial encounter: Secondary | ICD-10-CM | POA: Diagnosis not present

## 2019-07-06 DIAGNOSIS — R0789 Other chest pain: Secondary | ICD-10-CM | POA: Diagnosis not present

## 2019-07-06 DIAGNOSIS — R0602 Shortness of breath: Secondary | ICD-10-CM | POA: Diagnosis not present

## 2019-07-07 DIAGNOSIS — I10 Essential (primary) hypertension: Secondary | ICD-10-CM | POA: Diagnosis not present

## 2019-07-07 DIAGNOSIS — R0789 Other chest pain: Secondary | ICD-10-CM | POA: Diagnosis not present

## 2019-07-07 DIAGNOSIS — I509 Heart failure, unspecified: Secondary | ICD-10-CM | POA: Diagnosis not present

## 2019-07-07 DIAGNOSIS — R0602 Shortness of breath: Secondary | ICD-10-CM | POA: Diagnosis not present

## 2019-07-07 DIAGNOSIS — E119 Type 2 diabetes mellitus without complications: Secondary | ICD-10-CM | POA: Diagnosis not present

## 2019-07-07 DIAGNOSIS — I5021 Acute systolic (congestive) heart failure: Secondary | ICD-10-CM | POA: Diagnosis not present

## 2019-07-07 DIAGNOSIS — E43 Unspecified severe protein-calorie malnutrition: Secondary | ICD-10-CM | POA: Diagnosis not present

## 2019-07-07 DIAGNOSIS — R079 Chest pain, unspecified: Secondary | ICD-10-CM | POA: Diagnosis not present

## 2019-07-08 DIAGNOSIS — R0602 Shortness of breath: Secondary | ICD-10-CM | POA: Diagnosis not present

## 2019-07-08 DIAGNOSIS — E119 Type 2 diabetes mellitus without complications: Secondary | ICD-10-CM | POA: Diagnosis not present

## 2019-07-08 DIAGNOSIS — I509 Heart failure, unspecified: Secondary | ICD-10-CM | POA: Diagnosis not present

## 2019-07-08 DIAGNOSIS — I10 Essential (primary) hypertension: Secondary | ICD-10-CM | POA: Diagnosis not present

## 2019-07-08 DIAGNOSIS — R0789 Other chest pain: Secondary | ICD-10-CM | POA: Diagnosis not present

## 2019-07-08 DIAGNOSIS — E43 Unspecified severe protein-calorie malnutrition: Secondary | ICD-10-CM | POA: Diagnosis not present

## 2019-07-08 DIAGNOSIS — I5021 Acute systolic (congestive) heart failure: Secondary | ICD-10-CM | POA: Diagnosis not present

## 2019-07-08 DIAGNOSIS — R079 Chest pain, unspecified: Secondary | ICD-10-CM | POA: Diagnosis not present

## 2019-07-09 DIAGNOSIS — R0602 Shortness of breath: Secondary | ICD-10-CM | POA: Diagnosis not present

## 2019-07-09 DIAGNOSIS — E43 Unspecified severe protein-calorie malnutrition: Secondary | ICD-10-CM | POA: Diagnosis not present

## 2019-07-09 DIAGNOSIS — R0789 Other chest pain: Secondary | ICD-10-CM | POA: Diagnosis not present

## 2019-07-09 DIAGNOSIS — I5021 Acute systolic (congestive) heart failure: Secondary | ICD-10-CM | POA: Diagnosis not present

## 2019-07-09 DIAGNOSIS — R079 Chest pain, unspecified: Secondary | ICD-10-CM | POA: Diagnosis not present

## 2019-07-09 DIAGNOSIS — I10 Essential (primary) hypertension: Secondary | ICD-10-CM | POA: Diagnosis not present

## 2019-07-09 DIAGNOSIS — E119 Type 2 diabetes mellitus without complications: Secondary | ICD-10-CM | POA: Diagnosis not present

## 2019-07-09 DIAGNOSIS — I509 Heart failure, unspecified: Secondary | ICD-10-CM | POA: Diagnosis not present

## 2019-07-10 DIAGNOSIS — I10 Essential (primary) hypertension: Secondary | ICD-10-CM | POA: Diagnosis not present

## 2019-07-10 DIAGNOSIS — R0602 Shortness of breath: Secondary | ICD-10-CM | POA: Diagnosis not present

## 2019-07-10 DIAGNOSIS — I509 Heart failure, unspecified: Secondary | ICD-10-CM | POA: Diagnosis not present

## 2019-07-10 DIAGNOSIS — R079 Chest pain, unspecified: Secondary | ICD-10-CM | POA: Diagnosis not present

## 2019-07-10 DIAGNOSIS — E43 Unspecified severe protein-calorie malnutrition: Secondary | ICD-10-CM | POA: Diagnosis not present

## 2019-07-10 DIAGNOSIS — R0789 Other chest pain: Secondary | ICD-10-CM | POA: Diagnosis not present

## 2019-07-10 DIAGNOSIS — E119 Type 2 diabetes mellitus without complications: Secondary | ICD-10-CM | POA: Diagnosis not present

## 2019-07-10 DIAGNOSIS — I5021 Acute systolic (congestive) heart failure: Secondary | ICD-10-CM | POA: Diagnosis not present

## 2019-07-11 DIAGNOSIS — I5021 Acute systolic (congestive) heart failure: Secondary | ICD-10-CM | POA: Diagnosis not present

## 2019-07-12 DIAGNOSIS — I5021 Acute systolic (congestive) heart failure: Secondary | ICD-10-CM | POA: Diagnosis not present

## 2019-07-13 DIAGNOSIS — I5021 Acute systolic (congestive) heart failure: Secondary | ICD-10-CM | POA: Diagnosis not present

## 2019-07-14 DIAGNOSIS — I10 Essential (primary) hypertension: Secondary | ICD-10-CM | POA: Diagnosis not present

## 2019-07-14 DIAGNOSIS — E43 Unspecified severe protein-calorie malnutrition: Secondary | ICD-10-CM | POA: Diagnosis not present

## 2019-07-14 DIAGNOSIS — E119 Type 2 diabetes mellitus without complications: Secondary | ICD-10-CM | POA: Diagnosis not present

## 2019-07-14 DIAGNOSIS — M9711XA Periprosthetic fracture around internal prosthetic right knee joint, initial encounter: Secondary | ICD-10-CM | POA: Diagnosis not present

## 2019-07-14 DIAGNOSIS — I509 Heart failure, unspecified: Secondary | ICD-10-CM | POA: Diagnosis not present

## 2019-07-23 ENCOUNTER — Other Ambulatory Visit: Payer: Self-pay | Admitting: Family Medicine

## 2019-07-31 ENCOUNTER — Emergency Department: Payer: Medicare Other

## 2019-07-31 ENCOUNTER — Other Ambulatory Visit: Payer: Self-pay

## 2019-07-31 ENCOUNTER — Inpatient Hospital Stay
Admission: EM | Admit: 2019-07-31 | Discharge: 2019-08-04 | DRG: 291 | Disposition: A | Discharge status: Skilled Nursing Facility | Payer: Medicare Other | Source: Skilled Nursing Facility | Attending: Internal Medicine | Admitting: Internal Medicine

## 2019-07-31 DIAGNOSIS — W19XXXD Unspecified fall, subsequent encounter: Secondary | ICD-10-CM | POA: Diagnosis present

## 2019-07-31 DIAGNOSIS — Z79899 Other long term (current) drug therapy: Secondary | ICD-10-CM

## 2019-07-31 DIAGNOSIS — E1159 Type 2 diabetes mellitus with other circulatory complications: Secondary | ICD-10-CM | POA: Diagnosis not present

## 2019-07-31 DIAGNOSIS — K449 Diaphragmatic hernia without obstruction or gangrene: Secondary | ICD-10-CM | POA: Diagnosis present

## 2019-07-31 DIAGNOSIS — E669 Obesity, unspecified: Secondary | ICD-10-CM | POA: Diagnosis present

## 2019-07-31 DIAGNOSIS — I509 Heart failure, unspecified: Secondary | ICD-10-CM | POA: Diagnosis not present

## 2019-07-31 DIAGNOSIS — Z880 Allergy status to penicillin: Secondary | ICD-10-CM

## 2019-07-31 DIAGNOSIS — S7291XD Unspecified fracture of right femur, subsequent encounter for closed fracture with routine healing: Secondary | ICD-10-CM | POA: Diagnosis not present

## 2019-07-31 DIAGNOSIS — I252 Old myocardial infarction: Secondary | ICD-10-CM | POA: Diagnosis not present

## 2019-07-31 DIAGNOSIS — R918 Other nonspecific abnormal finding of lung field: Secondary | ICD-10-CM | POA: Diagnosis not present

## 2019-07-31 DIAGNOSIS — E119 Type 2 diabetes mellitus without complications: Secondary | ICD-10-CM

## 2019-07-31 DIAGNOSIS — E1169 Type 2 diabetes mellitus with other specified complication: Secondary | ICD-10-CM | POA: Diagnosis present

## 2019-07-31 DIAGNOSIS — J9601 Acute respiratory failure with hypoxia: Secondary | ICD-10-CM | POA: Diagnosis present

## 2019-07-31 DIAGNOSIS — I083 Combined rheumatic disorders of mitral, aortic and tricuspid valves: Secondary | ICD-10-CM | POA: Diagnosis present

## 2019-07-31 DIAGNOSIS — H9190 Unspecified hearing loss, unspecified ear: Secondary | ICD-10-CM | POA: Diagnosis present

## 2019-07-31 DIAGNOSIS — Z882 Allergy status to sulfonamides status: Secondary | ICD-10-CM

## 2019-07-31 DIAGNOSIS — R0602 Shortness of breath: Secondary | ICD-10-CM | POA: Diagnosis not present

## 2019-07-31 DIAGNOSIS — I5021 Acute systolic (congestive) heart failure: Secondary | ICD-10-CM | POA: Diagnosis not present

## 2019-07-31 DIAGNOSIS — I2510 Atherosclerotic heart disease of native coronary artery without angina pectoris: Secondary | ICD-10-CM

## 2019-07-31 DIAGNOSIS — E785 Hyperlipidemia, unspecified: Secondary | ICD-10-CM | POA: Diagnosis present

## 2019-07-31 DIAGNOSIS — Z7902 Long term (current) use of antithrombotics/antiplatelets: Secondary | ICD-10-CM

## 2019-07-31 DIAGNOSIS — Z887 Allergy status to serum and vaccine status: Secondary | ICD-10-CM

## 2019-07-31 DIAGNOSIS — M25569 Pain in unspecified knee: Secondary | ICD-10-CM

## 2019-07-31 DIAGNOSIS — E70319 Ocular albinism, unspecified: Secondary | ICD-10-CM | POA: Diagnosis present

## 2019-07-31 DIAGNOSIS — R0789 Other chest pain: Secondary | ICD-10-CM | POA: Diagnosis not present

## 2019-07-31 DIAGNOSIS — R001 Bradycardia, unspecified: Secondary | ICD-10-CM | POA: Diagnosis not present

## 2019-07-31 DIAGNOSIS — Z7401 Bed confinement status: Secondary | ICD-10-CM | POA: Diagnosis not present

## 2019-07-31 DIAGNOSIS — G600 Hereditary motor and sensory neuropathy: Secondary | ICD-10-CM | POA: Diagnosis present

## 2019-07-31 DIAGNOSIS — Z8673 Personal history of transient ischemic attack (TIA), and cerebral infarction without residual deficits: Secondary | ICD-10-CM

## 2019-07-31 DIAGNOSIS — Z96653 Presence of artificial knee joint, bilateral: Secondary | ICD-10-CM | POA: Diagnosis present

## 2019-07-31 DIAGNOSIS — Z20822 Contact with and (suspected) exposure to covid-19: Secondary | ICD-10-CM | POA: Diagnosis present

## 2019-07-31 DIAGNOSIS — M25561 Pain in right knee: Secondary | ICD-10-CM | POA: Diagnosis present

## 2019-07-31 DIAGNOSIS — I25118 Atherosclerotic heart disease of native coronary artery with other forms of angina pectoris: Secondary | ICD-10-CM | POA: Diagnosis present

## 2019-07-31 DIAGNOSIS — G4733 Obstructive sleep apnea (adult) (pediatric): Secondary | ICD-10-CM | POA: Diagnosis present

## 2019-07-31 DIAGNOSIS — Z881 Allergy status to other antibiotic agents status: Secondary | ICD-10-CM

## 2019-07-31 DIAGNOSIS — E875 Hyperkalemia: Secondary | ICD-10-CM | POA: Diagnosis present

## 2019-07-31 DIAGNOSIS — F4320 Adjustment disorder, unspecified: Secondary | ICD-10-CM | POA: Diagnosis present

## 2019-07-31 DIAGNOSIS — I5043 Acute on chronic combined systolic (congestive) and diastolic (congestive) heart failure: Secondary | ICD-10-CM | POA: Diagnosis present

## 2019-07-31 DIAGNOSIS — E876 Hypokalemia: Secondary | ICD-10-CM | POA: Diagnosis present

## 2019-07-31 DIAGNOSIS — G4730 Sleep apnea, unspecified: Secondary | ICD-10-CM | POA: Diagnosis present

## 2019-07-31 DIAGNOSIS — R0902 Hypoxemia: Secondary | ICD-10-CM | POA: Diagnosis not present

## 2019-07-31 DIAGNOSIS — D649 Anemia, unspecified: Secondary | ICD-10-CM

## 2019-07-31 DIAGNOSIS — Z66 Do not resuscitate: Secondary | ICD-10-CM | POA: Diagnosis present

## 2019-07-31 DIAGNOSIS — Z888 Allergy status to other drugs, medicaments and biological substances status: Secondary | ICD-10-CM

## 2019-07-31 DIAGNOSIS — Z6831 Body mass index (BMI) 31.0-31.9, adult: Secondary | ICD-10-CM

## 2019-07-31 DIAGNOSIS — K219 Gastro-esophageal reflux disease without esophagitis: Secondary | ICD-10-CM | POA: Diagnosis present

## 2019-07-31 DIAGNOSIS — R5381 Other malaise: Secondary | ICD-10-CM | POA: Diagnosis not present

## 2019-07-31 DIAGNOSIS — G459 Transient cerebral ischemic attack, unspecified: Secondary | ICD-10-CM | POA: Diagnosis not present

## 2019-07-31 DIAGNOSIS — I10 Essential (primary) hypertension: Secondary | ICD-10-CM | POA: Diagnosis present

## 2019-07-31 DIAGNOSIS — Z86718 Personal history of other venous thrombosis and embolism: Secondary | ICD-10-CM

## 2019-07-31 DIAGNOSIS — Z955 Presence of coronary angioplasty implant and graft: Secondary | ICD-10-CM

## 2019-07-31 DIAGNOSIS — I110 Hypertensive heart disease with heart failure: Secondary | ICD-10-CM | POA: Diagnosis not present

## 2019-07-31 DIAGNOSIS — M2550 Pain in unspecified joint: Secondary | ICD-10-CM | POA: Diagnosis not present

## 2019-07-31 DIAGNOSIS — Z91013 Allergy to seafood: Secondary | ICD-10-CM

## 2019-07-31 DIAGNOSIS — Z7984 Long term (current) use of oral hypoglycemic drugs: Secondary | ICD-10-CM

## 2019-07-31 DIAGNOSIS — I5033 Acute on chronic diastolic (congestive) heart failure: Secondary | ICD-10-CM | POA: Diagnosis not present

## 2019-07-31 DIAGNOSIS — E11618 Type 2 diabetes mellitus with other diabetic arthropathy: Secondary | ICD-10-CM | POA: Diagnosis not present

## 2019-07-31 DIAGNOSIS — Z7952 Long term (current) use of systemic steroids: Secondary | ICD-10-CM

## 2019-07-31 DIAGNOSIS — Z885 Allergy status to narcotic agent status: Secondary | ICD-10-CM

## 2019-07-31 DIAGNOSIS — R079 Chest pain, unspecified: Secondary | ICD-10-CM | POA: Diagnosis not present

## 2019-07-31 LAB — PROTIME-INR
INR: 0.9 (ref 0.8–1.2)
Prothrombin Time: 11.4 seconds (ref 11.4–15.2)

## 2019-07-31 LAB — COMPREHENSIVE METABOLIC PANEL
ALT: 15 U/L (ref 0–44)
AST: 19 U/L (ref 15–41)
Albumin: 3.5 g/dL (ref 3.5–5.0)
Alkaline Phosphatase: 83 U/L (ref 38–126)
Anion gap: 12 (ref 5–15)
BUN: 15 mg/dL (ref 8–23)
CO2: 25 mmol/L (ref 22–32)
Calcium: 9 mg/dL (ref 8.9–10.3)
Chloride: 99 mmol/L (ref 98–111)
Creatinine, Ser: 0.93 mg/dL (ref 0.44–1.00)
GFR calc Af Amer: >60 mL/min (ref >60)
GFR calc non Af Amer: 56 mL/min — ABNORMAL LOW (ref >60)
Glucose, Bld: 180 mg/dL — ABNORMAL HIGH (ref 70–99)
Potassium: 4.1 mmol/L (ref 3.5–5.1)
Sodium: 136 mmol/L (ref 135–145)
Total Bilirubin: 0.6 mg/dL (ref 0.3–1.2)
Total Protein: 6.2 g/dL — ABNORMAL LOW (ref 6.5–8.1)

## 2019-07-31 LAB — CBC WITH DIFFERENTIAL/PLATELET
Abs Immature Granulocytes: 0.01 10*3/uL (ref 0.00–0.07)
Basophils Absolute: 0 10*3/uL (ref 0.0–0.1)
Basophils Relative: 0 %
Eosinophils Absolute: 0.1 10*3/uL (ref 0.0–0.5)
Eosinophils Relative: 2 %
HCT: 34.2 % — ABNORMAL LOW (ref 36.0–46.0)
Hemoglobin: 11.2 g/dL — ABNORMAL LOW (ref 12.0–15.0)
Immature Granulocytes: 0 %
Lymphocytes Relative: 25 %
Lymphs Abs: 1.3 10*3/uL (ref 0.7–4.0)
MCH: 28.7 pg (ref 26.0–34.0)
MCHC: 32.7 g/dL (ref 30.0–36.0)
MCV: 87.7 fL (ref 80.0–100.0)
Monocytes Absolute: 0.3 10*3/uL (ref 0.1–1.0)
Monocytes Relative: 6 %
Neutro Abs: 3.5 10*3/uL (ref 1.7–7.7)
Neutrophils Relative %: 67 %
Platelets: 163 10*3/uL (ref 150–400)
RBC: 3.9 MIL/uL (ref 3.87–5.11)
RDW: 15.6 % — ABNORMAL HIGH (ref 11.5–15.5)
WBC: 5.3 10*3/uL (ref 4.0–10.5)
nRBC: 0 % (ref 0.0–0.2)

## 2019-07-31 LAB — BRAIN NATRIURETIC PEPTIDE: B Natriuretic Peptide: 502 pg/mL — ABNORMAL HIGH (ref 0.0–100.0)

## 2019-07-31 LAB — TROPONIN I (HIGH SENSITIVITY): Troponin I (High Sensitivity): 13 ng/L (ref <18)

## 2019-07-31 MED ORDER — FUROSEMIDE 10 MG/ML IJ SOLN
40.0000 mg | Freq: Every day | INTRAMUSCULAR | Status: DC
  Administered 2019-08-01 – 2019-08-03 (×3): 40 mg via INTRAVENOUS
  Filled 2019-07-31 (×3): qty 4

## 2019-07-31 MED ORDER — SODIUM CHLORIDE 0.9% FLUSH
3.0000 mL | Freq: Two times a day (BID) | INTRAVENOUS | Status: DC
  Administered 2019-08-01 – 2019-08-04 (×8): 3 mL via INTRAVENOUS

## 2019-07-31 MED ORDER — ACETAMINOPHEN 325 MG PO TABS
650.0000 mg | ORAL_TABLET | ORAL | Status: DC | PRN
  Administered 2019-08-01 – 2019-08-04 (×6): 650 mg via ORAL
  Filled 2019-07-31 (×6): qty 2

## 2019-07-31 MED ORDER — ONDANSETRON HCL 4 MG/2ML IJ SOLN
4.0000 mg | Freq: Four times a day (QID) | INTRAMUSCULAR | Status: DC | PRN
  Administered 2019-08-01 – 2019-08-04 (×2): 4 mg via INTRAVENOUS
  Filled 2019-07-31 (×2): qty 2

## 2019-07-31 MED ORDER — SODIUM CHLORIDE 0.9% FLUSH: 3.0000 mL | INTRAVENOUS | Status: DC | PRN

## 2019-07-31 MED ORDER — SODIUM CHLORIDE 0.9 % IV SOLN: 250.0000 mL | INTRAVENOUS | Status: DC | PRN

## 2019-07-31 MED ORDER — ACETAMINOPHEN 325 MG PO TABS
650.0000 mg | ORAL_TABLET | Freq: Once | ORAL | Status: AC
  Administered 2019-07-31: 650 mg via ORAL
  Filled 2019-07-31: qty 2

## 2019-07-31 MED ORDER — FUROSEMIDE 10 MG/ML IJ SOLN
40.0000 mg | Freq: Once | INTRAMUSCULAR | Status: AC
  Administered 2019-08-01: 40 mg via INTRAVENOUS
  Filled 2019-07-31: qty 4

## 2019-07-31 MED ORDER — ENOXAPARIN SODIUM 40 MG/0.4ML ~~LOC~~ SOLN
40.0000 mg | SUBCUTANEOUS | Status: DC
  Administered 2019-08-01 – 2019-08-03 (×4): 40 mg via SUBCUTANEOUS
  Filled 2019-07-31 (×4): qty 0.4

## 2019-07-31 NOTE — H&P (Signed)
History and Physical    [name] YHC:[id number] DOB: 03/17/1930 DOA: 07/31/2019  PCP: Abner Greenspan, MD  Patient coming from: [place] have personally briefly reviewed patient's old medical records in [place]  Chief Complaint: Chest pain  HPI: Lydia King is a 86 y.o. female with medical history significant for CAD s/p stent, hypertension, aortic valve stenosis, CVA, OSA on CPAP, GI bleed, type 2 diabetes, hyperlipidemia who presents with chest pain.   Patient was awoken out of her sleep this morning with acute midsternal squeezing chest pain and occasional left-sided stabbing pain. This was also associated with shortness of breath, headache and nausea. Episode lasted for about 6 hours until she presented to the ED. States pain felt similar to her recent MI in May but was not as severe. Daughter at bedside also noticed increasing lower extremity edema for the past few days. She takes 20 mg of Lasix daily with normal urine output but has not been getting a sodium free diet in the nursing facility.  Patient recently hospitalized at [facility] from 5/16 to 06/19/2019 for chest pain and underwent PCI with stenting of the LAD and ostial circumflex and had notable three-severe vessel CAD. She then was admitted again on 6/6 at [facility] for acute decompensated systolic heart failure treated with diuretics and discharged home with Lasix. Echocardiogram at that time showed EF of 55%, mild/moderate aortic stenosis mild MR and TR, trivial effusion.  In the ED, she was afebrile, hypertensive with systolic up to 517O on room air. BNP of 502. Troponin of 13. EKG with normal sinus rhythm. No leukocytosis and had mild anemia of 11.2. CMP largely unremarkable.  Chest x-ray shows cardiomegaly with findings suggestive of interstitial edema.  She was given IV 40mg  Lasix in ED.    Review of Systems:  Constitutional: No Weight Change, No Fever ENT/Mouth: No sore throat, No  Rhinorrhea Eyes: No Eye Pain, No Vision Changes Cardiovascular: + Chest Pain,+ SOB, No PND, No Orthopnea, No Claudication, + Edema, No Palpitations Respiratory: No Cough, No Sputum Gastrointestinal: + Nausea, No Vomiting, No Diarrhea, No Constipation, No Pain Genitourinary: + Urinary Incontinence Musculoskeletal: No Arthralgias, No Myalgias Skin: No Skin Lesions, No Pruritus, Neuro: no Weakness, No Numbness,  No Loss of Consciousness, No Syncope Psych: No Anxiety/Panic, No Depression, no decrease appetite Heme/Lymph: No Bruising, No Bleeding  Past Medical History:  Diagnosis Date  . Anemia    in past (from blood loss at hiatal hernia)  . Asthma   . Charcot-Marie-Tooth disease    walks with cane (fairly controlled)  . Diverticulosis   . DVT (deep venous thrombosis) ([place]) 2003   after sx  . GERD (gastroesophageal reflux disease)   . Grief reaction    on fluoxetine  . Headache(784.0)    migraine  . Hiatal hernia   . Hyperlipidemia   . Hypertension   . MI (myocardial infarction) ([facility] [facility])   . OA (ocular albinism) ([place])   . Osteopenia   . PUD (peptic ulcer disease)    gastric, past  . Shingles   . Stroke ([place]) 8/10   posterior reversible encephalopathy syndrome  . Urine incontinence     Past Surgical History:  Procedure Laterality Date  . carotid dopplers  8/10  . COLONOSCOPY  2002  . HERNIA REPAIR  2002   bleeding  . HIATAL HERNIA REPAIR  2000   needed blood transfusion  . Stroke  8/10   hosp stroke/ posterior (vs posterior reversible encephalopathy  syndrome) - d/c on Plavix  . TEE normal  7/65   no emoblic source  . TOTAL KNEE ARTHROPLASTY  03,06   Bilateral  . VESICOVAGINAL FISTULA CLOSURE W/ TAH  1964   with bleeding and cervical dysplasia     reports that she has never smoked. She has never used smokeless tobacco. She reports that she does not drink alcohol and does not use drugs.  Allergies  Allergen Reactions  . Morphine And Related Anaphylaxis  . Shrimp  [Shellfish Allergy] Anaphylaxis    Anaphylaxis-swelling of throat/sob    . Amlodipine Besylate     REACTION: edema  . Atorvastatin     REACTION: pain- could not walk  . Ciprofloxacin     REACTION: unkown  . Codeine     REACTION: itchy rash  . Influenza Vaccines Other (See Comments)    Arm swelling, redness, chills, fever  . Nitrofurantoin   . Omeprazole     REACTION: not effective  . Pantoprazole Sodium     REACTION: does not work well  . Penicillins     REACTION: itchy rash  . Simvastatin     REACTION: leg pain  . Sulfonamide Derivatives     REACTION: itchy rash    Family History  Problem Relation Age of Onset  . Cerebral aneurysm Son   . Cerebral aneurysm Father   . Hyperlipidemia Father   . Alcohol abuse Father   . Stroke Mother   . Cervical cancer Sister   . Hyperlipidemia Brother   . Alcohol abuse Brother   . Stroke Brother   . Diabetes Brother   . Diabetes Sister   . Sarcoidosis Sister   . Other Sister        OP/ Broken hip  . Other Other        whole family has charcot marie tooth  . Ovarian cancer Sister      Prior to Admission medications   Medication Sig Start Date End Date Taking? Authorizing Provider  acetaminophen (TYLENOL) 500 MG tablet Take 1,000 mg by mouth every 8 (eight) hours as needed.   Yes [provider]  acetaminophen (TYLENOL) 650 MG CR tablet Take 650 mg by mouth every 8 (eight) hours as needed for pain.   Yes [provider]  alum & mag hydroxide-simeth (MAALOX/MYLANTA) 200-200-20 MG/5ML suspension Take 30 mLs by mouth in the morning, at noon, and at bedtime.   Yes [provider]  azelastine (ASTELIN) 0.1 % nasal spray Place 1 spray into both nostrils 2 (two) times daily. Use in each nostril as directed 08/26/17  Yes Cuthriell, Charline Bills, PA-C  Blood Glucose Monitoring Suppl (FREESTYLE LITE) DEVI Use to check blood glucose daily and as needed for diabetes 10/11/17  Yes Tower, Wynelle Fanny, MD  calcium-vitamin D  (OSCAL WITH D) 500-200 MG-UNIT tablet Take 1 tablet by mouth daily with breakfast.   Yes [provider]  Cholecalciferol (VITAMIN D) 2000 UNITS CAPS Take 2 capsules by mouth daily.     Yes [provider]  clopidogrel (PLAVIX) 75 MG tablet TAKE 1 TABLET BY MOUTH EVERY DAY 07/26/19  Yes Tower, Wynelle Fanny, MD  cyanocobalamin 1000 MCG tablet Take 1,000 mcg by mouth daily.   Yes [provider]  esomeprazole (NEXIUM) 40 MG capsule Take 1 capsule (40 mg total) by mouth daily before breakfast. 07/27/18  Yes Tower, Wynelle Fanny, MD  famotidine (PEPCID) 20 MG tablet Take 20 mg by mouth at bedtime.   Yes [provider]  ferrous sulfate 325 (65 FE) MG tablet Take 325 mg by mouth daily with breakfast.   Yes [provider]  Flaxseed, Linseed, (FLAX SEED OIL) 1000 MG CAPS Take 1 capsule by mouth daily.     Yes [provider]  FLUoxetine (PROZAC) 20 MG capsule Take 1 capsule (20 mg total) by mouth daily. 07/27/18  Yes Tower, Marne A, MD  glucose blood (FREESTYLE LITE) test strip Use to check blood sugar once daily for DM (dx. Q22.297) 11/08/18  Yes Tower, Wynelle Fanny, MD  isosorbide mononitrate (IMDUR) 30 MG 24 hr tablet Take 30 mg by mouth daily.   Yes [provider]  Lancets (FREESTYLE) lancets Use as instructedUse to check blood sugar once daily for DM (dx. L89.211) 11/08/18  Yes Tower, Wynelle Fanny, MD  lidocaine (LMX) 4 % cream Apply 1 application topically as needed.   Yes [provider]  loperamide (IMODIUM) 2 MG capsule Take 2 mg by mouth as needed for diarrhea or loose stools.   Yes [provider]  loratadine (CLARITIN) 10 MG tablet TAKE 1 TABLET BY MOUTH EVERY DAY 02/28/19  Yes Tower, Marne A, MD  melatonin 3 MG TABS tablet Take 3 mg by mouth at bedtime.   Yes [provider]  metFORMIN (GLUCOPHAGE) 1000 MG tablet Take 1 tablet (1,000 mg total) by mouth 2 (two) times daily with a meal. 07/27/18  Yes Tower, Wynelle Fanny, MD  metoprolol  succinate (TOPROL-XL) 100 MG 24 hr tablet Take 100 mg by mouth daily. Take with or immediately following a meal.   Yes [provider]  metoprolol succinate (TOPROL-XL) 25 MG 24 hr tablet Take 25 mg by mouth daily.   Yes [provider]  Multiple Vitamin (MULTIVITAMIN) capsule Take 1 capsule by mouth daily.   Yes [provider]  Multiple Vitamins-Minerals (PRESERVISION AREDS 2+MULTI VIT PO) Take 1 tablet by mouth at bedtime.   Yes [provider]  omega-3 acid ethyl esters (LOVAZA) 1 g capsule TAKE 1 CAPSULE (1 G TOTAL) BY MOUTH DAILY. 07/26/19  Yes Tower, Wynelle Fanny, MD  ondansetron (ZOFRAN) 4 MG tablet Take 4 mg by mouth every 4 (four) hours as needed for nausea or vomiting.   Yes [provider]  polyethylene glycol (MIRALAX / GLYCOLAX) 17 g packet Take 17 g by mouth daily as needed.   Yes [provider]  PROAIR HFA 108 (90 Base) MCG/ACT inhaler INHALE 2 PUFFS INTO THE LUNGS EVERY 4 (FOUR) HOURS AS NEEDED FOR WHEEZING OR SHORTNESS OF BREATH. 03/05/16  Yes Tower, Wynelle Fanny, MD  rosuvastatin (CRESTOR) 10 MG tablet Take 1 tablet (10 mg total) by mouth at bedtime. 07/27/18  Yes Tower, Wynelle Fanny, MD  sennosides-docusate sodium (SENOKOT-S) 8.6-50 MG tablet Take 2 tablets by mouth daily.   Yes [provider]  cetirizine (ZYRTEC) 10 MG tablet Take 1 tablet (10 mg total) by mouth daily. Patient not taking: Reported on 07/31/2019 08/26/17   Cuthriell, Charline Bills, PA-C  furosemide (LASIX) 20 MG tablet TAKE 2 TABLETS BY MOUTH EVERY DAY Patient not taking: Reported on 07/31/2019 07/26/19   Tower, Wynelle Fanny, MD  losartan (COZAAR) 100 MG tablet TAKE 1/2 TAB BY MOUTH ONCE A DAY Patient not taking: Reported on 07/31/2019 07/27/18   Tower, Wynelle Fanny, MD  Misc. Devices (WALKER) MISC Wheeled walker with seat- Pt must use for ambulation-- for use as directed: ambulation dx: 729.5, 780.79, 786.05, 784.0, 715.89.  Patient not taking: Reported on 07/31/2019  [provider]  predniSONE (DELTASONE) 10 MG tablet Take 1 pill by mouth once daily for 5 days, then 1/2 pill once daily for 5 days Patient not taking: Reported on 07/31/2019 01/26/19   Abner Greenspan, MD    Physical Exam: Vitals:   07/31/19 1910 07/31/19 2100 07/31/19 2130 07/31/19 2200  BP:  (!) 168/64 (!) 162/97 (!) 159/56  Pulse:  61 63 62  Resp:  17 18 (!) 21  Temp: 98.3 F (36.8 C)     TempSrc: Oral     SpO2:  92% 90% 90%  Weight:      Height:        Constitutional: NAD, calm, comfortable, elderly female laying flat in bed Vitals:   07/31/19 1910 07/31/19 2100 07/31/19 2130 07/31/19 2200  BP:  (!) 168/64 (!) 162/97 (!) 159/56  Pulse:  61 63 62  Resp:  17 18 (!) 21  Temp: 98.3 F (36.8 C)     TempSrc: Oral     SpO2:  92% 90% 90%  Weight:      Height:       Eyes: PERRL, lids and conjunctivae normal ENMT: Mucous membranes are moist.  Neck: normal, supple, no masses, no thyromegaly Respiratory: Right-sided basilar crackles with no wheezing. Normal respiratory effort with oxygen saturation of 92% on room air and improved up to 96% on 2 L. No accessory muscle use.  Cardiovascular: Regular rate and rhythm, 2 out of 3 systolic murmur heard best at the right sternal border. Trace bilateral distal ankle lower extremity edema. No chest wall pain with palpation. Abdomen: no tenderness, no masses palpated.  Bowel sounds positive.  Musculoskeletal: no clubbing / cyanosis. No joint deformity upper and lower extremities. Good ROM, no contractures. Normal muscle tone.  Skin: no rashes, lesions, ulcers. No induration. Healed bilateral knee surgical scar. Neurologic: CN 2-12 grossly intact. Sensation intact. Strength 5/5 in all 4.  Psychiatric: Normal judgment and insight. Alert and oriented x 3. Normal mood.     Labs on Admission: I have personally reviewed following labs and imaging studies  CBC: Recent Labs  Lab 07/31/19 1910  WBC 5.3  NEUTROABS 3.5  HGB 11.2*  HCT 34.2*   MCV 87.7  PLT 161   Basic Metabolic Panel: Recent Labs  Lab 07/31/19 1910  NA 136  K 4.1  CL 99  CO2 25  GLUCOSE 180*  BUN 15  CREATININE 0.93  CALCIUM 9.0   GFR: Estimated Creatinine Clearance: 38.3 mL/min (by C-G formula based on SCr of 0.93 mg/dL). Liver Function Tests: Recent Labs  Lab 07/31/19 1910  AST 19  ALT 15  ALKPHOS 83  BILITOT 0.6  PROT 6.2*  ALBUMIN 3.5   No results for input(s): LIPASE, AMYLASE in the last 168 hours. No results for input(s): AMMONIA in the last 168 hours. Coagulation Profile: Recent Labs  Lab 07/31/19 1910  INR 0.9   Cardiac Enzymes: No results for input(s): CKTOTAL, CKMB, CKMBINDEX, TROPONINI in the last 168 hours. BNP (last 3 results) No results for input(s): PROBNP in the last 8760 hours. HbA1C: No results for input(s): HGBA1C in the last 72 hours. CBG: No results for input(s): GLUCAP in the last 168 hours. Lipid Profile: No results for input(s): CHOL, HDL, LDLCALC, TRIG, CHOLHDL, LDLDIRECT in the last 72 hours. Thyroid Function Tests: No results for input(s): TSH, T4TOTAL, FREET4, T3FREE, THYROIDAB in the last 72 hours. Anemia Panel: No results for input(s): VITAMINB12, FOLATE, FERRITIN, TIBC, IRON, RETICCTPCT in the last  72 hours. Urine analysis:    Component Value Date/Time   COLORURINE YELLOW 09/30/2008 0236   APPEARANCEUR CLOUDY (A) 09/30/2008 0236   LABSPEC 1.011 09/30/2008 0236   PHURINE 6.5 09/30/2008 0236   GLUCOSEU NEGATIVE 09/30/2008 0236   HGBUR NEGATIVE 09/30/2008 0236   BILIRUBINUR Negative 10/02/2015 1032   KETONESUR NEGATIVE 09/30/2008 0236   PROTEINUR Negative 10/02/2015 1032   PROTEINUR NEGATIVE 09/30/2008 0236   UROBILINOGEN 0.2 10/02/2015 1032   UROBILINOGEN 0.2 09/30/2008 0236   NITRITE Negative 10/02/2015 1032   NITRITE NEGATIVE 09/30/2008 0236   LEUKOCYTESUR small (1+) (A) 10/02/2015 1032    Radiological Exams on Admission: DG Chest Portable 1 View  Result Date: 07/31/2019 CLINICAL  DATA:  Chest pain, history of MI with stent placement in February EXAM: PORTABLE CHEST 1 VIEW COMPARISON:  Radiograph 03/14/2019 FINDINGS: There is cardiomegaly as well as a large hiatal hernia similar to comparison. Calcified aorta noted as well. Remaining cardiomediastinal contours are unremarkable. Diffuse hazy opacities present throughout the lungs more pronounced in the perihilar and basilar distribution. Central vascular cuffing and peripheral septal lines are noted. Low lung volumes with atelectatic changes as well. No pneumothorax or visible effusion. No focal consolidative opacity. Degenerative changes are present in the imaged spine and shoulders. Features of prior avascular necrosis of the left humeral head are similar to comparison. IMPRESSION: 1. Cardiomegaly with findings most suggestive of interstitial edema. Constellation of findings are suggestive of CHF. 2. Large hiatal hernia. 3. Low lung volumes with atelectatic changes. 4. Features of prior avascular necrosis of the left humeral head. 5. Aortic Atherosclerosis (ICD10-I70.0). Electronically Signed   By: Lovena Le M.D.   On: 07/31/2019 19:40      Assessment/Plan  Acute on chronic systolic heart failure exacerbation Normally on 20 mg p.o. Lasix. We'll continue IV 40 mg Lasix daily. Will hold off on echocardiogram as she recently had one on 6/18. Suspect she might just need an up titration of her Lasix especially due to sodium in her diet. Strict I's and O's Daily weight  Acute hypoxic respiratory failure secondary to CHF exacerbation Monitor with continuous pulse ox with diuresis  Hypertension Continue metoprolol, Imdur Unclear why the patient appears to be taken off of her ARB at the nursing facility  History of CAD s/p stent Continue Plavix  History of CVA Continue Plavix  Type 2 diabetes Sensitive SSI  Anemia Continue folate and vitamin B12 supplementation  OSA Continue CPAP  Hyperlipidemia Continue  statin  DVT prophylaxis:.Lovenox Code Status: DNR Family Communication: Plan discussed with patient and daughter at bedside.  All questions and concerns were addressed disposition Plan: Home with at least 2 midnight stays  Consults called:  Admission status: inpatient  Status is: Inpatient  Remains inpatient appropriate because:Inpatient level of care appropriate due to severity of illness   Dispo: The patient is from: SNF              Anticipated d/c is to: SNF              Anticipated d/c date is: 3 days              Patient currently is not medically stable to d/c.         Orene Desanctis DO [facility]   If 7PM-7AM, please contact night-coverage [email or web address]   07/31/2019, 11:39 PM

## 2019-07-31 NOTE — ED Triage Notes (Signed)
Pt arrives via EMS from [facility] after having chest pain since 8 AM- pt was given nitro and ASA- pt described it as a squeezing feeling- pt was 91% on RA per EMS and was 99% on 4L [place]- pt was also having SHOB but described it as exertional

## 2019-07-31 NOTE — ED Provider Notes (Signed)
[facility] Emergency Department Provider Note ____________________________________________   First MD Initiated Contact with Patient 07/31/19 1914     (approximate)  I have reviewed the triage vital signs and the nursing notes.   HISTORY  Chief Complaint Chest Pain    HPI Lydia King is a 86 y.o. female with PMH as noted below including CAD with a recent history of MI and catheterization at an outside hospital who presents with chest discomfort and shortness of breath, acute onset this morning, and described as a tightness.  O2 saturation was 91% on room air per EMS.  The patient is not on oxygen normally although does use CPAP at night.  She states that the pain has been intermittent over the last several hours and is worse if she moves around.  Past Medical History:  Diagnosis Date  . Anemia    in past (from blood loss at hiatal hernia)  . Asthma   . Charcot-Marie-Tooth disease    walks with cane (fairly controlled)  . Diverticulosis   . DVT (deep venous thrombosis) ([place]) 2003   after sx  . GERD (gastroesophageal reflux disease)   . Grief reaction    on fluoxetine  . Headache(784.0)    migraine  . Hiatal hernia   . Hyperlipidemia   . Hypertension   . MI (myocardial infarction) ([place])   . OA (ocular albinism) ([place])   . Osteopenia   . PUD (peptic ulcer disease)    gastric, past  . Shingles   . Stroke ([place]) 8/10   posterior reversible encephalopathy syndrome  . Urine incontinence     Patient Active Problem List   Diagnosis Date Noted  . Acute sinusitis 01/26/2019  . Hyperlipidemia associated with type 2 diabetes mellitus ([place]) 07/27/2018  . Sinus congestion 02/05/2018  . Vertigo 05/06/2017  . OSA (obstructive sleep apnea) 01/02/2016  . Pedal edema 01/02/2016  . Fall in home 12/11/2015  . Poor balance 12/11/2015  . Low back pain with sciatica 10/02/2015  . Right hip pain 10/02/2015  . Hearing loss 04/18/2015  . Encounter for  Medicare annual wellness exam 10/04/2013  . Diabetes mellitus ([place]) 08/31/2012  . CVA 09/12/2008  . OSTEOARTHRITIS, MULTIPLE JOINTS 05/30/2008  . Osteopenia 05/30/2008  . Vitamin D deficiency 07/14/2007  . SLEEP APNEA 07/14/2007  . Essential hypertension 11/30/2006  . Asthma 11/30/2006  . H/O gastroesophageal reflux (GERD) 11/30/2006  . G I BLEED 11/30/2006  . DEEP VENOUS THROMBOPHLEBITIS, HX OF 11/30/2006    Past Surgical History:  Procedure Laterality Date  . carotid dopplers  8/10  . COLONOSCOPY  2002  . HERNIA REPAIR  2002   bleeding  . HIATAL HERNIA REPAIR  2000   needed blood transfusion  . Stroke  8/10   hosp stroke/ posterior (vs posterior reversible encephalopathy syndrome) - d/c on Plavix  . TEE normal  4/66   no emoblic source  . TOTAL KNEE ARTHROPLASTY  03,06   Bilateral  . VESICOVAGINAL FISTULA CLOSURE W/ TAH  1964   with bleeding and cervical dysplasia    Prior to Admission medications   Medication Sig Start Date End Date Taking? Authorizing Provider  acetaminophen (TYLENOL) 650 MG CR tablet Take 650 mg by mouth every 8 (eight) hours as needed for pain.    [provider]  azelastine (ASTELIN) 0.1 % nasal spray Place 1 spray into both nostrils 2 (two) times daily. Use in each nostril as directed 08/26/17   Cuthriell, Charline Bills, PA-C  Blood Glucose Monitoring Suppl (FREESTYLE LITE) DEVI Use to check blood glucose daily and as needed for diabetes 10/11/17   Tower, Wynelle Fanny, MD  cetirizine (ZYRTEC) 10 MG tablet Take 1 tablet (10 mg total) by mouth daily. 08/26/17   Cuthriell, Charline Bills, PA-C  Cholecalciferol (VITAMIN D) 2000 UNITS CAPS Take 2 capsules by mouth daily.      [provider]  clopidogrel (PLAVIX) 75 MG tablet TAKE 1 TABLET BY MOUTH EVERY DAY 07/26/19   Tower, Wynelle Fanny, MD  esomeprazole (NEXIUM) 40 MG capsule Take 1 capsule (40 mg total) by mouth daily before breakfast. 07/27/18   Tower, Wynelle Fanny, MD  Flaxseed, Linseed, (FLAX SEED OIL) 1000  MG CAPS Take 1 capsule by mouth daily.      [provider]  FLUoxetine (PROZAC) 20 MG capsule Take 1 capsule (20 mg total) by mouth daily. 07/27/18   Tower, Wynelle Fanny, MD  furosemide (LASIX) 20 MG tablet TAKE 2 TABLETS BY MOUTH EVERY DAY 07/26/19   Tower, Roque Lias A, MD  glucose blood (FREESTYLE LITE) test strip Use to check blood sugar once daily for DM (dx. J47.829) 11/08/18   Tower, Wynelle Fanny, MD  Lancets (FREESTYLE) lancets Use as instructedUse to check blood sugar once daily for DM (dx. E11.618) 11/08/18   Tower, Wynelle Fanny, MD  loratadine (CLARITIN) 10 MG tablet TAKE 1 TABLET BY MOUTH EVERY DAY 02/28/19   Tower, Wynelle Fanny, MD  losartan (COZAAR) 100 MG tablet TAKE 1/2 TAB BY MOUTH ONCE A DAY 07/27/18   Tower, Wynelle Fanny, MD  metFORMIN (GLUCOPHAGE) 1000 MG tablet Take 1 tablet (1,000 mg total) by mouth 2 (two) times daily with a meal. 07/27/18   Tower, Wynelle Fanny, MD  Misc. Devices (WALKER) MISC Wheeled walker with seat- Pt must use for ambulation-- for use as directed: ambulation dx: 729.5, 780.79, 786.05, 784.0, 715.89.     [provider]  omega-3 acid ethyl esters (LOVAZA) 1 g capsule TAKE 1 CAPSULE (1 G TOTAL) BY MOUTH DAILY. 07/26/19   Tower, Wynelle Fanny, MD  predniSONE (DELTASONE) 10 MG tablet Take 1 pill by mouth once daily for 5 days, then 1/2 pill once daily for 5 days 01/26/19   Tower, Wynelle Fanny, MD  PROAIR HFA 108 [phone] Base) MCG/ACT inhaler INHALE 2 PUFFS INTO THE LUNGS EVERY 4 (FOUR) HOURS AS NEEDED FOR WHEEZING OR SHORTNESS OF BREATH. 03/05/16   Tower, Wynelle Fanny, MD  rosuvastatin (CRESTOR) 10 MG tablet Take 1 tablet (10 mg total) by mouth at bedtime. 07/27/18   Tower, Wynelle Fanny, MD    Allergies Morphine and related, Shrimp [shellfish allergy], Amlodipine besylate, Atorvastatin, Ciprofloxacin, Codeine, Influenza vaccines, Nitrofurantoin, Omeprazole, Pantoprazole sodium, Penicillins, Simvastatin, and Sulfonamide derivatives  Family History  Problem Relation Age of Onset  . Cerebral aneurysm Son   .  Cerebral aneurysm Father   . Hyperlipidemia Father   . Alcohol abuse Father   . Stroke Mother   . Cervical cancer Sister   . Hyperlipidemia Brother   . Alcohol abuse Brother   . Stroke Brother   . Diabetes Brother   . Diabetes Sister   . Sarcoidosis Sister   . Other Sister        OP/ Broken hip  . Other Other        whole family has charcot marie tooth  . Ovarian cancer Sister     Social History Social History   Tobacco Use  . Smoking status: Never Smoker  . Smokeless tobacco: Never  Used  Vaping Use  . Vaping Use: Never used  Substance Use Topics  . Alcohol use: No  . Drug use: No    Review of Systems  Constitutional: No fever/chills. Eyes: No redness. ENT: No sore throat. Cardiovascular: Positive for chest pain. Respiratory: Positive for shortness of breath. Gastrointestinal: No vomiting or diarrhea.  Genitourinary: Negative for flank pain.  Musculoskeletal: Negative for back pain. Skin: Negative for rash. Neurological: Negative for headache.   ____________________________________________   PHYSICAL EXAM:  VITAL SIGNS: ED Triage Vitals  Enc Vitals Group     BP --      Pulse --      Resp --      Temp --      Temp src --      SpO2 --      Weight 07/31/19 1858 165 lb (74.8 kg)     Height 07/31/19 1858 4\' 11"  (1.499 m)     Head Circumference --      Peak Flow --      Pain Score 07/31/19 1857 5     Pain Loc --      Pain Edu? --      Excl. in [place]? --     Constitutional: Alert and oriented. Well appearing for age and in no acute distress. Eyes: Conjunctivae are normal.  Head: Atraumatic. Nose: No congestion/rhinnorhea. Mouth/Throat: Mucous membranes are moist.   Neck: Normal range of motion.  Cardiovascular: Normal rate, regular rhythm. Grossly normal heart sounds.  Good peripheral circulation. Respiratory: Normal respiratory effort.  No retractions. Lungs CTAB. Gastrointestinal: Soft and nontender. No distention.  Genitourinary: No flank  tenderness. Musculoskeletal: No lower extremity edema.  Extremities warm and well perfused.  Neurologic:  Normal speech and language. No gross focal neurologic deficits are appreciated.  Skin:  Skin is warm and dry. No rash noted. Psychiatric: Mood and affect are normal. Speech and behavior are normal.  ____________________________________________   LABS (all labs ordered are listed, but only abnormal results are displayed)  Labs Reviewed  CBC WITH DIFFERENTIAL/PLATELET - Abnormal; Notable for the following components:      Result Value   Hemoglobin 11.2 (*)    HCT 34.2 (*)    RDW 15.6 (*)    All other components within normal limits  COMPREHENSIVE METABOLIC PANEL - Abnormal; Notable for the following components:   Glucose, Bld 180 (*)    Total Protein 6.2 (*)    GFR calc non Af Amer 56 (*)    All other components within normal limits  BRAIN NATRIURETIC PEPTIDE - Abnormal; Notable for the following components:   B Natriuretic Peptide 502.0 (*)    All other components within normal limits  SARS CORONAVIRUS 2 BY RT PCR (HOSPITAL ORDER, [place] [facility])  PROTIME-INR  TROPONIN I (HIGH SENSITIVITY)  TROPONIN I (HIGH SENSITIVITY)   ____________________________________________  EKG  ED ECG REPORT I, Arta Silence, the attending physician, personally viewed and interpreted this ECG.  Date: 07/31/2019 EKG Time: 1907 Rate: 64 Rhythm: normal sinus rhythm QRS Axis: Borderline right axis Intervals: normal ST/T Wave abnormalities: normal Narrative Interpretation: no evidence of acute ischemia  ____________________________________________  RADIOLOGY  CXR: No focal infiltrate.  Cardiomegaly with interstitial edema.  ____________________________________________   PROCEDURES  Procedure(s) performed: No  Procedures  Critical Care performed: No ____________________________________________   INITIAL IMPRESSION / ASSESSMENT AND PLAN / ED  COURSE  Pertinent labs & imaging results that were available during my care of the patient were reviewed  by me and considered in my medical decision making (see chart for details).  86 year old female with PMH as noted above and status post relatively recent MI and catheterization presents with chest tightness and shortness of breath since this morning.  I reviewed the past medical records in [place].  She was admitted at [facility] in May for NSTEMI, with cardiac catheterization showing multivessel disease.  She had PCI to her LAD.  She was subsequently readmitted last month with acute CHF and chest pain.  She was ruled out for ACS and was diuresed.  On exam today, the patient is overall well-appearing.  Her vital signs are normal except for hypertension.  The physical exam is unremarkable.  Her lungs are clear and there is no significant peripheral edema.  EKG shows no acute ischemic changes.  Differential includes ACS, CHF exacerbation, asthma/bronchospasm or other possible noncardiac etiology such as GERD or musculoskeletal pain.  We will obtain chest x-ray, lab work-up, cardiac enzymes, and reassess.  ----------------------------------------- 10:13 PM on 07/31/2019 -----------------------------------------  Initial troponin is negative.  BNP is slightly elevated.  Patient is overall comfortable appearing although O2 saturations still in the low 90s even on oxygen.  Based on further discussion with the patient and her daughter, we will admit for diuresis and further monitoring given the high risk of ACS and the patient's recent NSTEMI and CHF exacerbation.  I discussed the case with Dr. Flossie Buffy from the hospitalist service for admission. ____________________________________________   FINAL CLINICAL IMPRESSION(S) / ED DIAGNOSES  Final diagnoses:  Acute on chronic congestive heart failure, unspecified heart failure type ([place])      NEW MEDICATIONS STARTED DURING THIS VISIT:  New  Prescriptions   No medications on file     Note:  This document was prepared using Dragon voice recognition software and may include unintentional dictation errors.    Arta Silence, MD 07/31/19 2214

## 2019-08-01 ENCOUNTER — Encounter: Payer: Self-pay | Admitting: Family Medicine

## 2019-08-01 DIAGNOSIS — I10 Essential (primary) hypertension: Secondary | ICD-10-CM

## 2019-08-01 DIAGNOSIS — R0602 Shortness of breath: Secondary | ICD-10-CM

## 2019-08-01 DIAGNOSIS — I2510 Atherosclerotic heart disease of native coronary artery without angina pectoris: Secondary | ICD-10-CM

## 2019-08-01 DIAGNOSIS — I25118 Atherosclerotic heart disease of native coronary artery with other forms of angina pectoris: Secondary | ICD-10-CM

## 2019-08-01 DIAGNOSIS — D649 Anemia, unspecified: Secondary | ICD-10-CM

## 2019-08-01 LAB — BASIC METABOLIC PANEL
Anion gap: 12 (ref 5–15)
BUN: 15 mg/dL (ref 8–23)
CO2: 28 mmol/L (ref 22–32)
Calcium: 8.9 mg/dL (ref 8.9–10.3)
Chloride: 98 mmol/L (ref 98–111)
Creatinine, Ser: 0.79 mg/dL (ref 0.44–1.00)
GFR calc Af Amer: >60 mL/min (ref >60)
GFR calc non Af Amer: >60 mL/min (ref >60)
Glucose, Bld: 96 mg/dL (ref 70–99)
Potassium: 3.1 mmol/L — ABNORMAL LOW (ref 3.5–5.1)
Sodium: 138 mmol/L (ref 135–145)

## 2019-08-01 LAB — TROPONIN I (HIGH SENSITIVITY): Troponin I (High Sensitivity): 13 ng/L (ref <18)

## 2019-08-01 LAB — HEMOGLOBIN A1C
Hgb A1c MFr Bld: 6.6 % — ABNORMAL HIGH (ref 4.8–5.6)
Mean Plasma Glucose: 142.72 mg/dL

## 2019-08-01 LAB — GLUCOSE, CAPILLARY
Glucose-Capillary: 99 mg/dL (ref 70–99)
Glucose-Capillary: 128 mg/dL — ABNORMAL HIGH (ref 70–99)
Glucose-Capillary: 97 mg/dL (ref 70–99)
Glucose-Capillary: 209 mg/dL — ABNORMAL HIGH (ref 70–99)

## 2019-08-01 LAB — SARS CORONAVIRUS 2 BY RT PCR (HOSPITAL ORDER, PERFORMED IN ~~LOC~~ HOSPITAL LAB): SARS Coronavirus 2: NEGATIVE

## 2019-08-01 LAB — MRSA PCR SCREENING: MRSA by PCR: NEGATIVE

## 2019-08-01 MED ORDER — ROSUVASTATIN CALCIUM 10 MG PO TABS
10.0000 mg | ORAL_TABLET | Freq: Every day | ORAL | Status: DC
  Administered 2019-08-01 – 2019-08-03 (×4): 10 mg via ORAL
  Filled 2019-08-01 (×4): qty 1

## 2019-08-01 MED ORDER — SENNOSIDES-DOCUSATE SODIUM 8.6-50 MG PO TABS
2.0000 | ORAL_TABLET | Freq: Every day | ORAL | Status: DC
  Administered 2019-08-02 – 2019-08-04 (×3): 2 via ORAL
  Filled 2019-08-01 (×4): qty 2

## 2019-08-01 MED ORDER — FAMOTIDINE 20 MG PO TABS
20.0000 mg | ORAL_TABLET | Freq: Every day | ORAL | Status: DC
  Administered 2019-08-01 – 2019-08-03 (×4): 20 mg via ORAL
  Filled 2019-08-01 (×4): qty 1

## 2019-08-01 MED ORDER — INSULIN ASPART 100 UNIT/ML ~~LOC~~ SOLN
0.0000 [IU] | Freq: Three times a day (TID) | SUBCUTANEOUS | Status: DC
  Administered 2019-08-01: 1 [IU] via SUBCUTANEOUS
  Administered 2019-08-02: 2 [IU] via SUBCUTANEOUS
  Administered 2019-08-02 – 2019-08-03 (×2): 1 [IU] via SUBCUTANEOUS
  Administered 2019-08-03 – 2019-08-04 (×2): 2 [IU] via SUBCUTANEOUS
  Filled 2019-08-01 (×6): qty 1

## 2019-08-01 MED ORDER — OMEGA-3-ACID ETHYL ESTERS 1 G PO CAPS
1.0000 g | ORAL_CAPSULE | Freq: Every day | ORAL | Status: DC
  Administered 2019-08-01 – 2019-08-04 (×4): 1 g via ORAL
  Filled 2019-08-01 (×4): qty 1

## 2019-08-01 MED ORDER — CLOPIDOGREL BISULFATE 75 MG PO TABS
75.0000 mg | ORAL_TABLET | Freq: Every day | ORAL | Status: DC
  Administered 2019-08-01 – 2019-08-04 (×4): 75 mg via ORAL
  Filled 2019-08-01 (×4): qty 1

## 2019-08-01 MED ORDER — ASPIRIN EC 81 MG PO TBEC
81.0000 mg | DELAYED_RELEASE_TABLET | Freq: Every day | ORAL | Status: DC
  Administered 2019-08-01 – 2019-08-04 (×4): 81 mg via ORAL
  Filled 2019-08-01 (×4): qty 1

## 2019-08-01 MED ORDER — ISOSORBIDE MONONITRATE ER 60 MG PO TB24
30.0000 mg | ORAL_TABLET | Freq: Every day | ORAL | Status: DC
  Administered 2019-08-01: 30 mg via ORAL
  Filled 2019-08-01: qty 1

## 2019-08-01 MED ORDER — METOPROLOL SUCCINATE ER 50 MG PO TB24: 50.0000 mg | ORAL_TABLET | Freq: Every day | ORAL | Status: DC

## 2019-08-01 MED ORDER — LISINOPRIL 20 MG PO TABS
20.0000 mg | ORAL_TABLET | Freq: Every day | ORAL | Status: DC
  Administered 2019-08-01: 20 mg via ORAL
  Filled 2019-08-01 (×2): qty 1

## 2019-08-01 MED ORDER — POTASSIUM CHLORIDE CRYS ER 20 MEQ PO TBCR
40.0000 meq | EXTENDED_RELEASE_TABLET | ORAL | Status: AC
  Administered 2019-08-01 (×3): 40 meq via ORAL
  Filled 2019-08-01 (×3): qty 2

## 2019-08-01 MED ORDER — FERROUS SULFATE 325 (65 FE) MG PO TABS
325.0000 mg | ORAL_TABLET | Freq: Every day | ORAL | Status: DC
  Administered 2019-08-01 – 2019-08-04 (×4): 325 mg via ORAL
  Filled 2019-08-01 (×4): qty 1

## 2019-08-01 MED ORDER — ALBUTEROL SULFATE (2.5 MG/3ML) 0.083% IN NEBU: 2.5000 mg | INHALATION_SOLUTION | RESPIRATORY_TRACT | Status: DC | PRN

## 2019-08-01 MED ORDER — METOPROLOL SUCCINATE ER 50 MG PO TB24
100.0000 mg | ORAL_TABLET | Freq: Every day | ORAL | Status: DC
  Administered 2019-08-01: 100 mg via ORAL
  Filled 2019-08-01: qty 2

## 2019-08-01 MED ORDER — METOPROLOL SUCCINATE ER 50 MG PO TB24: 25.0000 mg | ORAL_TABLET | Freq: Every day | ORAL | Status: DC

## 2019-08-01 MED ORDER — PANTOPRAZOLE SODIUM 40 MG PO TBEC: 40.0000 mg | DELAYED_RELEASE_TABLET | Freq: Every day | ORAL | Status: DC

## 2019-08-01 MED ORDER — POLYETHYLENE GLYCOL 3350 17 G PO PACK
17.0000 g | PACK | Freq: Every day | ORAL | Status: DC | PRN
  Administered 2019-08-01: 17 g via ORAL
  Filled 2019-08-01: qty 1

## 2019-08-01 MED ORDER — MELATONIN 5 MG PO TABS
5.0000 mg | ORAL_TABLET | Freq: Every day | ORAL | Status: DC
  Administered 2019-08-01 – 2019-08-03 (×4): 5 mg via ORAL
  Filled 2019-08-01 (×4): qty 1

## 2019-08-01 MED ORDER — VITAMIN B-12 1000 MCG PO TABS
1000.0000 ug | ORAL_TABLET | Freq: Every day | ORAL | Status: DC
  Administered 2019-08-01 – 2019-08-04 (×4): 1000 ug via ORAL
  Filled 2019-08-01 (×4): qty 1

## 2019-08-01 MED ORDER — ISOSORBIDE MONONITRATE ER 60 MG PO TB24
60.0000 mg | ORAL_TABLET | Freq: Every day | ORAL | Status: DC
  Administered 2019-08-02 – 2019-08-04 (×3): 60 mg via ORAL
  Filled 2019-08-01 (×3): qty 1

## 2019-08-01 MED ORDER — FLUOXETINE HCL 20 MG PO CAPS
20.0000 mg | ORAL_CAPSULE | Freq: Every day | ORAL | Status: DC
  Administered 2019-08-01 – 2019-08-04 (×4): 20 mg via ORAL
  Filled 2019-08-01 (×4): qty 1

## 2019-08-01 NOTE — Consult Note (Addendum)
Cardiology Consultation:   Patient ID: Lydia King MRN: [id number]; DOB: 07/13/1930  Admit date: 07/31/2019 Date of Consult: 08/01/2019  Primary Care Provider: Abner Greenspan, MD [facility] Cardiologist: No primary care provider on file. [facility]    Patient Profile:   Lydia King is a 86 y.o. female with a hx of CAD,AS followed at [facility] who is being seen today for the evaluation of chest pain  at the request of Dr Carlos Levering  History of Present Illness:   Ms. [name] admitted 7/4 with chest pain, and found to have BP mod elevated 170, hsTn neg and w/o delta and BNP 502;    Has had recurrent problems with chest pain over the last 4-5 months following her problems with her femur fracture 2/21.  Ultimately, 5/21 hospitalized [facility] for CP>> abnormal MRI stress >>PCI/stenting of LAD, CXostial w/ underlying 3VCAD Rx with plavix & ASA ;   readmitted 6/21 w Cx of CP similar to but worse than presenting at this time.  Also found to be hypertensive with A/C HFpEF high-sensitivity troponins were negative.  A change in her diuretic regime to as needed had been made.  Presents again with chest pain awakening her at night associate with severe shortness of breath.  ASA having been stopped; also previously on lisinopril,but not currently.  On arrival to the ER she was hypertensive.  Troponins have been negative.  She has been in a rehab facility since 2/21 following femur fracture w slow healing after recent hospitalization at [facility] she was transferred to a facility here in [place] in closer proximity to her daughter  Feeling quite good today.  No chest pain or shortness of breath  At her facility she is extremely limited walking only about 10 feet to the bathroom.  Denies associated chest pain  DATE TEST EF   5/21 cMRI-stress  71 % LAE mod-severe Inducible ischemia  5/21 LHC  3V CAD not candidate for CABG High risk (IABP) PCI atherectomy/DES CXo and DES LAD  6/21 Echo  55% AS mild-mod     Date Cr K Hgb  5/21 0.6 4.5 10.5  7/21 0.79 3.1<<4.1 11.2     Past hx notable for stroke, DM, OSA on CPAP,     Past Medical History:  Diagnosis Date   Anemia    in past (from blood loss at hiatal hernia)   Asthma    Charcot-Marie-Tooth disease    walks with cane (fairly controlled)   Diverticulosis    DVT (deep venous thrombosis) ([name]) 2003   after sx   GERD (gastroesophageal reflux disease)    Grief reaction    on fluoxetine   Headache(784.0)    migraine   Hiatal hernia    Hyperlipidemia    Hypertension    MI (myocardial infarction) ([name])    OA (ocular albinism) ([name])    Osteopenia    PUD (peptic ulcer disease)    gastric, past   Shingles    Stroke ([name]) 8/10   posterior reversible encephalopathy syndrome   Urine incontinence     Past Surgical History:  Procedure Laterality Date   carotid dopplers  8/10   COLONOSCOPY  2002   HERNIA REPAIR  2002   bleeding   HIATAL HERNIA REPAIR  2000   needed blood transfusion   Stroke  8/10   hosp stroke/ posterior (vs posterior reversible encephalopathy syndrome) - d/c on Plavix   TEE normal  8/34   no emoblic source   TOTAL KNEE  ARTHROPLASTY  03,06   Bilateral   VESICOVAGINAL FISTULA CLOSURE W/ TAH  1964   with bleeding and cervical dysplasia       Inpatient Medications: Scheduled Meds:  clopidogrel  75 mg Oral Daily   enoxaparin (LOVENOX) injection  40 mg Subcutaneous Q24H   famotidine  20 mg Oral QHS   ferrous sulfate  325 mg Oral Q breakfast   FLUoxetine  20 mg Oral Daily   furosemide  40 mg Intravenous Daily   insulin aspart  0-9 Units Subcutaneous TID WC   isosorbide mononitrate  30 mg Oral Daily   melatonin  5 mg Oral QHS   metoprolol succinate  100 mg Oral Daily   omega-3 acid ethyl esters  1 g Oral Daily   rosuvastatin  10 mg Oral QHS   senna-docusate  2 tablet Oral Daily   sodium chloride flush  3 mL Intravenous Q12H   cyanocobalamin  1,000 mcg Oral  Daily   Continuous Infusions:  sodium chloride     PRN Meds: sodium chloride, acetaminophen, ondansetron (ZOFRAN) IV, polyethylene glycol, sodium chloride flush  Allergies:    Allergies  Allergen Reactions   Morphine And Related Anaphylaxis   Shrimp [Shellfish Allergy] Anaphylaxis    Anaphylaxis-swelling of throat/sob     Amlodipine Besylate     REACTION: edema   Atorvastatin     REACTION: pain- could not walk   Ciprofloxacin     REACTION: unkown   Codeine     REACTION: itchy rash   Influenza Vaccines Other (See Comments)    Arm swelling, redness, chills, fever   Nitrofurantoin    Omeprazole     REACTION: not effective   Pantoprazole Sodium     REACTION: does not work well   Penicillins     REACTION: itchy rash   Simvastatin     REACTION: leg pain   Sulfonamide Derivatives     REACTION: itchy rash    Social History:   Social History   Socioeconomic History   Marital status: Widowed    Spouse name: Not on file   Number of children: Not on file   Years of education: Not on file   Highest education level: Not on file  Occupational History   Not on file  Tobacco Use   Smoking status: Never Smoker   Smokeless tobacco: Never Used  Vaping Use   Vaping Use: Never used  Substance and Sexual Activity   Alcohol use: No   Drug use: No   Sexual activity: Not Currently  Other Topics Concern   Not on file  Social History Narrative   G87P4   Son died 2 years ago cerebral anerysm   Social Determinants of Health   Financial Resource Strain:    Difficulty of Paying Living Expenses:   Food Insecurity:    Worried About Charity fundraiser in the Last Year:    Arboriculturist in the Last Year:   Transportation Needs:    Film/video editor (Medical):    Lack of Transportation (Non-Medical):   Physical Activity:    Days of Exercise per Week:    Minutes of Exercise per Session:   Stress:    Feeling of Stress :   Social  Connections:    Frequency of Communication with Friends and Family:    Frequency of Social Gatherings with Friends and Family:    Attends Religious Services:    Active Member of Clubs or Organizations:    Attends  Music therapist:    Marital Status:   Intimate Partner Violence:    Fear of Current or Ex-Partner:    Emotionally Abused:    Physically Abused:    Sexually Abused:     Family History:     Family History  Problem Relation Age of Onset   Cerebral aneurysm Son    Cerebral aneurysm Father    Hyperlipidemia Father    Alcohol abuse Father    Stroke Mother    Cervical cancer Sister    Hyperlipidemia Brother    Alcohol abuse Brother    Stroke Brother    Diabetes Brother    Diabetes Sister    Sarcoidosis Sister    Other Sister        OP/ Broken hip   Other Other        whole family has charcot marie tooth   Ovarian cancer Sister      ROS:  Please see the history of present illness.    All other ROS reviewed and negative.     Physical Exam/Data:   Vitals:   08/01/19 0152 08/01/19 0734 08/01/19 1104 08/01/19 1155  BP:  (!) 180/62 (!) 157/60 (!) 163/56  Pulse:  61 61 (!) 57  Resp:  16 18 17   Temp: 98.2 F (36.8 C) 98 F (36.7 C)  98.2 F (36.8 C)  TempSrc: Oral Oral  Oral  SpO2:  92%  93%  Weight: 71.7 kg     Height: 4' 11.5" (1.511 m)       Intake/Output Summary (Last 24 hours) at 08/01/2019 1344 Last data filed at 08/01/2019 1155 Gross per 24 hour  Intake 363 ml  Output 2450 ml  Net -2087 ml   Last 3 Weights 08/01/2019 07/31/2019 03/14/2019  Weight (lbs) 158 lb 1.1 oz 165 lb 160 lb  Weight (kg) 71.7 kg 74.844 kg 72.576 kg     Body mass index is 31.39 kg/m.  General:  Well nourished, well developed, in no acute distress  HEENT: normal Lymph: no adenopathy Neck:  JVD 6-8 Vascular: No carotid bruits; FA pulses 2+ bilaterally without bruits  Cardiac:  normal S1, S2; RRR; 2/6 murmur  Lungs:  clear to auscultation  bilaterally, no wheezing, rhonchi or rales  Abd: soft, nontender, no hepatomegaly  Ext: no edema Musculoskeletal:  No deformities, BUE and BLE strength normal and equal Skin: warm and dry  Neuro:  CNs 2-12 intact, no focal abnormalities noted Psych:  Normal affect   EKG:  The EKG was personally reviewed and demonstrates:  Sinus WNL Telemetry:  Telemetry was personally reviewed and demonstrates:  Sinus but HR in 50s   Relevant CV Studies:    Laboratory Data:  High Sensitivity Troponin:   Recent Labs  Lab 07/31/19 1910 08/01/19 0034  TROPONINIHS 13 13     Chemistry Recent Labs  Lab 07/31/19 1910 08/01/19 0546  NA 136 138  K 4.1 3.1*  CL 99 98  CO2 25 28  GLUCOSE 180* 96  BUN 15 15  CREATININE 0.93 0.79  CALCIUM 9.0 8.9  GFRNONAA 56* >60  GFRAA >60 >60  ANIONGAP 12 12    Recent Labs  Lab 07/31/19 1910  PROT 6.2*  ALBUMIN 3.5  AST 19  ALT 15  ALKPHOS 83  BILITOT 0.6   Hematology Recent Labs  Lab 07/31/19 1910  WBC 5.3  RBC 3.90  HGB 11.2*  HCT 34.2*  MCV 87.7  MCH 28.7  MCHC 32.7  RDW 15.6*  PLT 163   BNP Recent Labs  Lab 07/31/19 1910  BNP 502.0*    DDimer No results for input(s): DDIMER in the last 168 hours.   Radiology/Studies:  DG Chest Portable 1 View  Result Date: 07/31/2019 CLINICAL DATA:  Chest pain, history of MI with stent placement in February EXAM: PORTABLE CHEST 1 VIEW COMPARISON:  Radiograph 03/14/2019 FINDINGS: There is cardiomegaly as well as a large hiatal hernia similar to comparison. Calcified aorta noted as well. Remaining cardiomediastinal contours are unremarkable. Diffuse hazy opacities present throughout the lungs more pronounced in the perihilar and basilar distribution. Central vascular cuffing and peripheral septal lines are noted. Low lung volumes with atelectatic changes as well. No pneumothorax or visible effusion. No focal consolidative opacity. Degenerative changes are present in the imaged spine and shoulders.  Features of prior avascular necrosis of the left humeral head are similar to comparison. IMPRESSION: 1. Cardiomegaly with findings most suggestive of interstitial edema. Constellation of findings are suggestive of CHF. 2. Large hiatal hernia. 3. Low lung volumes with atelectatic changes. 4. Features of prior avascular necrosis of the left humeral head. 5. Aortic Atherosclerosis (ICD10-I70.0). Electronically Signed   By: Lovena Le M.D.   On: 07/31/2019 19:40   {    Assessment and Plan:   1. CAD-3V with high risk PCI Cx/LAD 5/21 2. ASA off ?? 3. Bradycardia iatrogenic 4. Chest pain recurrent 5. Hypertension poorly controlled  6. CHF chronic diastolic 7. Femur fracture 8. Hypokalemia  Patient has recurrent chest pain in the setting of known complex coronary disease which is accompanied by significant shortness of breath.  Occurring concomitantly raises my concern that this is ischemic especially knowing that there is residual coronary disease.  She has poorly controlled hypertension which affords [place] the opportunity to be more aggressive with medical therapy.  Her bradycardia is not likely contributing but may as she gets stronger hence, would decrease her beta-blocker.  Increase imdur  We will also resume lisinopril which has been stopped somewhere along the line for reasons that she does not know; it was part of her discharge summary when she left [facility].  In addition, aspirin was left off of her MAR; we will resume it.  A big question for me is whether she should undergo repeat catheterization.  Her early presentations at [facility] were presumably benign subsequently she was found to have significant three-vessel disease for which he underwent high risk PCI; hence, the flat troponin curve is not very reassuring.  Following diuresis she appears euvolemic and is without shortness of breath.  We will consider that more in the morning.        For questions or updates, please contact [place] Please consult [email or web address] for contact info under    Signed, Virl Axe, MD  08/01/2019 1:44 PM

## 2019-08-01 NOTE — Plan of Care (Signed)
  Problem: Education: Goal: Knowledge of General Education information will improve Description Including pain rating scale, medication(s)/side effects and non-pharmacologic comfort measures Outcome: Progressing   

## 2019-08-01 NOTE — Progress Notes (Signed)
PROGRESS NOTE   Lydia King  JQZ:[id number]    DOB: 07-20-1930    DOA: 07/31/2019  PCP: Abner Greenspan, MD   I have briefly reviewed patients previous medical records in [facility].  Chief Complaint  Patient presents with  . Chest Pain    Brief Narrative:   86 y.o. female, currently SNF resident, ambulates with the help of a walker or cane, with medical history significant for CAD s/p stent, hypertension, aortic valve stenosis, CVA, OSA on CPAP, GI bleed, type 2 diabetes, hyperlipidemia who presents with chest pain similar to recent MI in May, associated with lower extremity edema.  Patient recently hospitalized at [facility] from 5/16 to 06/19/2019 for chest pain and underwent PCI with stenting of the LAD and ostial circumflex and had notable three-severe vessel CAD. She then was admitted again on 6/6 at [facility] for acute decompensated systolic heart failure treated with diuretics and discharged home with Lasix. Echocardiogram at that time showed EF of 55%, mild/moderate aortic stenosis mild MR and TR, trivial effusion.  Admitted for chest pain, acute on chronic diastolic CHF.  Cardiology consulted.   Assessment & Plan:  Principal Problem:   Acute CHF (congestive heart failure) (HCC) Active Problems:   Essential hypertension   History of CVA (cerebrovascular accident)   Sleep apnea   Diabetes mellitus ([place])   Hyperlipidemia associated with type 2 diabetes mellitus (HCC)   CAD (coronary artery disease)   Anemia   Chest pain: Concerning for unstable angina.  Given complex cardiac history, recent intervention, ongoing ischemic type of chest pain, I consulted cardiology.  I discussed with Dr. Caryl Comes.  Cardiology concerned about ischemic symptoms as well.  Currently adjusting medications-increasing Imdur, decreasing beta-blockers due to bradycardia, resuming lisinopril and aspirin.  They will reassess in a.m. regarding decision to repeat cardiac cath.  Acute on chronic diastolic CHF:  Improving with IV Lasix 40 mg daily.  -2.9 L thus far.  Continue.  Acute respiratory failure with hypoxia: Secondary to CHF.  Oxygen support and wean off as tolerated.  Not on oxygen PTA.  Essential hypertension: Uncontrolled.  Likely contributing to decompensated CHF.  Medication adjusted per cardiology.  Imdur 60 mg daily, lisinopril 20 mg daily, Toprol-XL 50 mg daily.  Monitor closely and adjust as needed.  CAD s/p recent PCI: Management as above.  Continue aspirin, Plavix, statins, Imdur and beta-blockers.  History of CVA: Now on aspirin and Plavix.  No residual deficits.  Type II DM: Reasonably controlled inpatient on SSI  Anemia: Continue B12 and folate supplements.  Follow CBC.  Hypokalemia: Replace and follow.  OSA on CPAP  Hyperlipidemia: Statins.  Body mass index is 31.39 kg/m./Obesity  Large hiatal hernia/GERD: Continue Pepcid.   DVT prophylaxis: Lovenox Code Status: DNR Family Communication: None at bedside Disposition:  Status is: Inpatient  Remains inpatient appropriate because:Inpatient level of care appropriate due to severity of illness   Dispo: The patient is from: Home              Anticipated d/c is to: SNF              Anticipated d/c date is: 3 days              Patient currently is not medically stable to d/c.        Consultants:   Cardiology  Procedures:   None  Antimicrobials:   None   Subjective:  Overall patient feels much better.  No chest pain since last night.  Had constant chest pain since night before last into last night.  Dyspnea significantly improved but breathing not back to baseline yet.  Objective:   Vitals:   08/01/19 0734 08/01/19 1104 08/01/19 1155 08/01/19 1610  BP: (!) 180/62 (!) 157/60 (!) 163/56 (!) 116/56  Pulse: 61 61 (!) 57 (!) 51  Resp: 16 18 17 18   Temp: 98 F (36.7 C)  98.2 F (36.8 C)   TempSrc: Oral  Oral Oral  SpO2: 92%  93% 92%  Weight:      Height:        General exam: Pleasant elderly  female, moderately built and obese lying comfortably propped up in bed without distress. Respiratory system: Occasional basal crackles but otherwise clear to auscultation Cardiovascular system: S1 & S2 heard, RRR. No JVD, murmurs, rubs, gallops or clicks.  Trace bilateral ankle edema.  Telemetry personally reviewed: Sinus bradycardia in the 50s-sinus rhythm in the 60s Gastrointestinal system: Abdomen is nondistended, soft and nontender. No organomegaly or masses felt. Normal bowel sounds heard. Central nervous system: Alert and oriented. No focal neurological deficits. Extremities: Symmetric 5 x 5 power. Skin: No rashes, lesions or ulcers Psychiatry: Judgement and insight appear normal. Mood & affect appropriate.     Data Reviewed:   I have personally reviewed following labs and imaging studies   CBC: Recent Labs  Lab 07/31/19 1910  WBC 5.3  NEUTROABS 3.5  HGB 11.2*  HCT 34.2*  MCV 87.7  PLT 836    Basic Metabolic Panel: Recent Labs  Lab 07/31/19 1910 08/01/19 0546  NA 136 138  K 4.1 3.1*  CL 99 98  CO2 25 28  GLUCOSE 180* 96  BUN 15 15  CREATININE 0.93 0.79  CALCIUM 9.0 8.9    Liver Function Tests: Recent Labs  Lab 07/31/19 1910  AST 19  ALT 15  ALKPHOS 83  BILITOT 0.6  PROT 6.2*  ALBUMIN 3.5    CBG: Recent Labs  Lab 08/01/19 0735 08/01/19 1156 08/01/19 1700  GLUCAP 99 128* 97    Microbiology Studies:   Recent Results (from the past 240 hour(s))  SARS Coronavirus 2 by RT PCR (hospital order, performed in [facility] hospital lab) Nasopharyngeal Nasopharyngeal Swab     Status: None   Collection Time: 08/01/19 12:06 AM   Specimen: Nasopharyngeal Swab  Result Value Ref Range Status   SARS Coronavirus 2 NEGATIVE NEGATIVE Final    Comment: (NOTE) SARS-CoV-2 target nucleic acids are NOT DETECTED.  The SARS-CoV-2 RNA is generally detectable in upper and lower respiratory specimens during the acute phase of infection. The lowest concentration of  SARS-CoV-2 viral copies this assay can detect is 250 copies / mL. A negative result does not preclude SARS-CoV-2 infection and should not be used as the sole basis for treatment or other patient management decisions.  A negative result may occur with improper specimen collection / handling, submission of specimen other than nasopharyngeal swab, presence of viral mutation(s) within the areas targeted by this assay, and inadequate number of viral copies (<250 copies / mL). A negative result must be combined with clinical observations, patient history, and epidemiological information.  Fact Sheet for Patients:   StrictlyIdeas.no  Fact Sheet for Healthcare Providers: BankingDealers.co.za  This test is not yet approved or  cleared by the [place] FDA and has been authorized for detection and/or diagnosis of SARS-CoV-2 by FDA under an Emergency Use Authorization (EUA).  This EUA will remain in effect (meaning this test can be used) for  the duration of the COVID-19 declaration under Section 564(b)(1) of the Act, 21 U.S.C. section 360bbb-3(b)(1), unless the authorization is terminated or revoked sooner.  Performed at [facility], [address] [place], [place] [address]   MRSA PCR Screening     Status: None   Collection Time: 08/01/19  2:08 AM   Specimen: Nasal Mucosa; Nasopharyngeal  Result Value Ref Range Status   MRSA by PCR NEGATIVE NEGATIVE Final    Comment:        The GeneXpert MRSA Assay (FDA approved for NASAL specimens only), is one component of a comprehensive MRSA colonization surveillance program. It is not intended to diagnose MRSA infection nor to guide or monitor treatment for MRSA infections. Performed at [facility], [address] [place], [place] [address]      Radiology Studies:  DG Chest Portable 1 View  Result Date: 07/31/2019 CLINICAL DATA:  Chest pain, history of MI with stent  placement in February EXAM: PORTABLE CHEST 1 VIEW COMPARISON:  Radiograph 03/14/2019 FINDINGS: There is cardiomegaly as well as a large hiatal hernia similar to comparison. Calcified aorta noted as well. Remaining cardiomediastinal contours are unremarkable. Diffuse hazy opacities present throughout the lungs more pronounced in the perihilar and basilar distribution. Central vascular cuffing and peripheral septal lines are noted. Low lung volumes with atelectatic changes as well. No pneumothorax or visible effusion. No focal consolidative opacity. Degenerative changes are present in the imaged spine and shoulders. Features of prior avascular necrosis of the left humeral head are similar to comparison. IMPRESSION: 1. Cardiomegaly with findings most suggestive of interstitial edema. Constellation of findings are suggestive of CHF. 2. Large hiatal hernia. 3. Low lung volumes with atelectatic changes. 4. Features of prior avascular necrosis of the left humeral head. 5. Aortic Atherosclerosis (ICD10-I70.0). Electronically Signed   By: Lovena Le M.D.   On: 07/31/2019 19:40     Scheduled Meds:   . aspirin EC  81 mg Oral Daily  . clopidogrel  75 mg Oral Daily  . enoxaparin (LOVENOX) injection  40 mg Subcutaneous Q24H  . famotidine  20 mg Oral QHS  . ferrous sulfate  325 mg Oral Q breakfast  . FLUoxetine  20 mg Oral Daily  . furosemide  40 mg Intravenous Daily  . insulin aspart  0-9 Units Subcutaneous TID WC  . [START ON 08/02/2019] isosorbide mononitrate  60 mg Oral Daily  . lisinopril  20 mg Oral Daily  . melatonin  5 mg Oral QHS  . [START ON 08/02/2019] metoprolol succinate  50 mg Oral Daily  . omega-3 acid ethyl esters  1 g Oral Daily  . potassium chloride  40 mEq Oral Q3H  . rosuvastatin  10 mg Oral QHS  . senna-docusate  2 tablet Oral Daily  . sodium chloride flush  3 mL Intravenous Q12H  . cyanocobalamin  1,000 mcg Oral Daily    Continuous Infusions:   . sodium chloride       LOS: 1 day       Vernell Leep, MD, [place], [facility]. [facility]    To contact the attending provider between 7A-7P or the covering provider during after hours 7P-7A, please log into the web site [email or web address] and access using universal [place] password for that web site. If you do not have the password, please call the hospital operator.  08/01/2019, 5:56 PM

## 2019-08-01 NOTE — ED Notes (Signed)
Apologized to pt and family about delay, pt given IV lasix and discussed bed assignment.  COVID swab completed.

## 2019-08-02 DIAGNOSIS — I5033 Acute on chronic diastolic (congestive) heart failure: Secondary | ICD-10-CM

## 2019-08-02 DIAGNOSIS — R079 Chest pain, unspecified: Secondary | ICD-10-CM

## 2019-08-02 DIAGNOSIS — E11618 Type 2 diabetes mellitus with other diabetic arthropathy: Secondary | ICD-10-CM

## 2019-08-02 DIAGNOSIS — E875 Hyperkalemia: Secondary | ICD-10-CM

## 2019-08-02 LAB — BASIC METABOLIC PANEL
Anion gap: 11 (ref 5–15)
BUN: 16 mg/dL (ref 8–23)
CO2: 28 mmol/L (ref 22–32)
Calcium: 9.1 mg/dL (ref 8.9–10.3)
Chloride: 97 mmol/L — ABNORMAL LOW (ref 98–111)
Creatinine, Ser: 0.78 mg/dL (ref 0.44–1.00)
GFR calc Af Amer: >60 mL/min (ref >60)
GFR calc non Af Amer: >60 mL/min (ref >60)
Glucose, Bld: 110 mg/dL — ABNORMAL HIGH (ref 70–99)
Potassium: 5.4 mmol/L — ABNORMAL HIGH (ref 3.5–5.1)
Sodium: 136 mmol/L (ref 135–145)

## 2019-08-02 LAB — GLUCOSE, CAPILLARY
Glucose-Capillary: 111 mg/dL — ABNORMAL HIGH (ref 70–99)
Glucose-Capillary: 149 mg/dL — ABNORMAL HIGH (ref 70–99)
Glucose-Capillary: 161 mg/dL — ABNORMAL HIGH (ref 70–99)
Glucose-Capillary: 123 mg/dL — ABNORMAL HIGH (ref 70–99)

## 2019-08-02 LAB — CBC
HCT: 32.9 % — ABNORMAL LOW (ref 36.0–46.0)
Hemoglobin: 11.3 g/dL — ABNORMAL LOW (ref 12.0–15.0)
MCH: 29.6 pg (ref 26.0–34.0)
MCHC: 34.3 g/dL (ref 30.0–36.0)
MCV: 86.1 fL (ref 80.0–100.0)
Platelets: 186 10*3/uL (ref 150–400)
RBC: 3.82 MIL/uL — ABNORMAL LOW (ref 3.87–5.11)
RDW: 15.8 % — ABNORMAL HIGH (ref 11.5–15.5)
WBC: 4.6 10*3/uL (ref 4.0–10.5)
nRBC: 0 % (ref 0.0–0.2)

## 2019-08-02 MED ORDER — LISINOPRIL 10 MG PO TABS
10.0000 mg | ORAL_TABLET | Freq: Every day | ORAL | Status: DC
  Administered 2019-08-02: 10 mg via ORAL

## 2019-08-02 MED ORDER — LISINOPRIL 10 MG PO TABS: 10.0000 mg | ORAL_TABLET | Freq: Every day | ORAL | Status: DC

## 2019-08-02 MED ORDER — METOPROLOL SUCCINATE ER 25 MG PO TB24
25.0000 mg | ORAL_TABLET | Freq: Every day | ORAL | Status: DC
  Administered 2019-08-04: 25 mg via ORAL
  Filled 2019-08-02 (×2): qty 1

## 2019-08-02 NOTE — Progress Notes (Signed)
Pt with low HR ( 54)- MD made aware/ orders to hold A.M. dose of Metoprolol due to low HR

## 2019-08-02 NOTE — Progress Notes (Addendum)
Progress Note  Patient Name: Lydia King Date of Encounter: 08/02/2019  [facility] Cardiologist: [facility]  Subjective   Patient feels well this morning. She had one episode of transient chest pain lasting a minute overnight. No shortness of breath, palpitations, lightheadedness, or edema. In hindsight, she feels like her biggest concern leading up to yesterday's hospitalization was generalized fatigue, though she also admits to some chest pain yesterday.  Inpatient Medications    Scheduled Meds: . aspirin EC  81 mg Oral Daily  . clopidogrel  75 mg Oral Daily  . enoxaparin (LOVENOX) injection  40 mg Subcutaneous Q24H  . famotidine  20 mg Oral QHS  . ferrous sulfate  325 mg Oral Q breakfast  . FLUoxetine  20 mg Oral Daily  . furosemide  40 mg Intravenous Daily  . insulin aspart  0-9 Units Subcutaneous TID WC  . isosorbide mononitrate  60 mg Oral Daily  . lisinopril  20 mg Oral Daily  . melatonin  5 mg Oral QHS  . metoprolol succinate  25 mg Oral Daily  . omega-3 acid ethyl esters  1 g Oral Daily  . rosuvastatin  10 mg Oral QHS  . senna-docusate  2 tablet Oral Daily  . sodium chloride flush  3 mL Intravenous Q12H  . cyanocobalamin  1,000 mcg Oral Daily   Continuous Infusions: . sodium chloride     PRN Meds: sodium chloride, acetaminophen, albuterol, ondansetron (ZOFRAN) IV, polyethylene glycol, sodium chloride flush   Vital Signs    Vitals:   08/01/19 1610 08/01/19 1929 08/02/19 0606 08/02/19 0750  BP: (!) 116/56 (!) 103/48 (!) 116/45 (!) 130/42  Pulse: (!) 51 (!) 55 (!) 52 (!) 54  Resp: 18 20  17   Temp:  98.7 F (37.1 C)  98.6 F (37 C)  TempSrc: Oral Oral  Oral  SpO2: 92% 91% 93% 95%  Weight:      Height:        Intake/Output Summary (Last 24 hours) at 08/02/2019 0902 Last data filed at 08/02/2019 0803 Gross per 24 hour  Intake 363 ml  Output 2500 ml  Net -2137 ml   Last 3 Weights 08/01/2019 07/31/2019 03/14/2019  Weight (lbs) 158 lb 1.1 oz 165 lb 160 lb    Weight (kg) 71.7 kg 74.844 kg 72.576 kg      Telemetry    Sinus bradycardia with heart rates of 45 to 60 bpm - Personally Reviewed  ECG    07/31/2019 19:07 normal sinus rhythm without significant abnormality. - Personally Reviewed  Physical Exam   GEN: No acute distress.   Neck: No JVD Cardiac:  Bradycardic but regular with 2/6 systolic murmur. Respiratory:  Faint crackles at the lung bases. Good air movement. GI: Soft, nontender, non-distended  MS: No edema; No deformity. Neuro:  Nonfocal  Psych: Normal affect   Labs    High Sensitivity Troponin:   Recent Labs  Lab 07/31/19 1910 08/01/19 0034  TROPONINIHS 13 13      Chemistry Recent Labs  Lab 07/31/19 1910 08/01/19 0546 08/02/19 0711  NA 136 138 136  K 4.1 3.1* 5.4*  CL 99 98 97*  CO2 25 28 28   GLUCOSE 180* 96 110*  BUN 15 15 16   CREATININE 0.93 0.79 0.78  CALCIUM 9.0 8.9 9.1  PROT 6.2*  --   --   ALBUMIN 3.5  --   --   AST 19  --   --   ALT 15  --   --  ALKPHOS 83  --   --   BILITOT 0.6  --   --   GFRNONAA 56* >60 >60  GFRAA >60 >60 >60  ANIONGAP 12 12 11      Hematology Recent Labs  Lab 07/31/19 1910 08/02/19 0711  WBC 5.3 4.6  RBC 3.90 3.82*  HGB 11.2* 11.3*  HCT 34.2* 32.9*  MCV 87.7 86.1  MCH 28.7 29.6  MCHC 32.7 34.3  RDW 15.6* 15.8*  PLT 163 186    BNP Recent Labs  Lab 07/31/19 1910  BNP 502.0*     DDimer No results for input(s): DDIMER in the last 168 hours.   Radiology    DG Chest Portable 1 View  Result Date: 07/31/2019 CLINICAL DATA:  Chest pain, history of MI with stent placement in February EXAM: PORTABLE CHEST 1 VIEW COMPARISON:  Radiograph 03/14/2019 FINDINGS: There is cardiomegaly as well as a large hiatal hernia similar to comparison. Calcified aorta noted as well. Remaining cardiomediastinal contours are unremarkable. Diffuse hazy opacities present throughout the lungs more pronounced in the perihilar and basilar distribution. Central vascular cuffing and  peripheral septal lines are noted. Low lung volumes with atelectatic changes as well. No pneumothorax or visible effusion. No focal consolidative opacity. Degenerative changes are present in the imaged spine and shoulders. Features of prior avascular necrosis of the left humeral head are similar to comparison. IMPRESSION: 1. Cardiomegaly with findings most suggestive of interstitial edema. Constellation of findings are suggestive of CHF. 2. Large hiatal hernia. 3. Low lung volumes with atelectatic changes. 4. Features of prior avascular necrosis of the left humeral head. 5. Aortic Atherosclerosis (ICD10-I70.0). Electronically Signed   By: Lovena Le M.D.   On: 07/31/2019 19:40    Cardiac Studies   PCI (06/17/2019, [facility]): Successful PCI of the ostial LAD using 3.0 x 16 mm drug-eluting stent. Successful rotational atherectomy and PCI to ostial LCx using 3.0 x 16 mm drug-eluting stent.  LHC (06/15/2019, [facility]): Severe three-vessel coronary artery disease (further details not available).  Echocardiogram (06/15/2019, [facility]): LVEF greater than 55% with mild LVH. Normal RV contraction. Mild MR. Moderate aortic stenosis. Mild to moderate TR.  Patient Profile     86 y.o. female with history of coronary artery disease status post recent PCI's to the ostial LAD and LCx at [facility], chronic HFpEF, moderate aortic stenosis, and hypertension, admitted with dyspnea, fatigue, and chest pain.  Assessment & Plan    Shortness of breath and chest pain with history of coronary artery disease and HFpEF: Lydia King reports that fatigue with her primary concern, though she did have some chest pain leading up to her admission yesterday. She was noted to be markedly hypertensive on arrival with improvement in blood pressures. High-sensitivity troponin I was negligible x2. BNP was elevated. I suspect that her symptoms are more likely due to demand ischemia in the setting of acute on chronic HFpEF. Excessive beta-blockade may  also be contributing to some of her fatigue and shortness of breath, given resting heart rates in the 40s to 50s.  Decrease metoprolol tartrate to 25 mg twice daily.  Continue isosorbide mononitrate 60 mg daily, as increased yesterday.  The patient and I have agreed to defer catheterization, given the lack of continued chest pain. If she were to have recurrent angina, this will need to be readdressed.  Continue diuresis with furosemide 40 mg IV daily for at least 1 more day.  Continue aspirin, clopidogrel, and rosuvasatin.  Hypertension: Blood pressure significantly lower compared with admission.  Decrease metoprolol, as above.  Continue isosorbide mononitrate 60 mg daily.  Decrease lisinopril to 10 mg daily to facilitate escalation of isosorbide mononitrate, as needed.  Valvular heart disease: Recent echo at [facility] showed moderate aortic stenosis as well as mild to moderate mitral and tricuspid regurgitation.  Diuresis as above.  Outpatient follow-up through [facility] cardiology.  For questions or updates, please contact [place] Please consult [email or web address] for contact info under [facility] Cardiology.     Signed, Nelva Bush, MD  08/02/2019, 9:02 AM

## 2019-08-02 NOTE — Progress Notes (Signed)
PROGRESS NOTE   Lydia King  YJE:[id number]    DOB: May 09, 1930    DOA: 07/31/2019  PCP: Abner Greenspan, MD   I have briefly reviewed patients previous medical records in [facility].  Chief Complaint  Patient presents with   Chest Pain    Brief Narrative:   86 y.o. female, currently SNF resident, ambulates with the help of a walker or cane, with medical history significant for CAD s/p stent, hypertension, aortic valve stenosis, CVA, OSA on CPAP, GI bleed, type 2 diabetes, hyperlipidemia who presents with chest pain similar to recent MI in May, associated with lower extremity edema.  Patient recently hospitalized at [facility] from 5/16 to 06/19/2019 for chest pain and underwent PCI with stenting of the LAD and ostial circumflex and had notable three-severe vessel CAD. She then was admitted again on 6/6 at [facility] for acute decompensated systolic heart failure treated with diuretics and discharged home with Lasix. Echocardiogram at that time showed EF of 55%, mild/moderate aortic stenosis mild MR and TR, trivial effusion.  Admitted for chest pain, acute on chronic diastolic CHF.  Cardiology consulted.   Assessment & Plan:   Chest pain in the setting of known coronary artery disease Symptoms were concerning for angina.  Cardiology was consulted.  Patient denies any chest pain this morning.  By cardiology this morning.  Dose of metoprolol was reduced.  Nitrates were continued.  Holding off on cardiac catheterization.  Continue with IV diuretics for 1 more day.  Continue with antiplatelet agents and statin medications.   Acute on chronic diastolic CHF Patient seems to be improving with intravenous diuretics.  Continue to monitor ins and outs and daily weights.    Acute respiratory failure with hypoxia This was secondary to fluid overload from CHF.  Improved with her diuretics.  Currently saturating normal on room air.    Essential hypertension/hyperkalemia Blood pressure was poorly  controlled.  Seems to have improved.  Dose of metoprolol was decreased due to bradycardia.  Continue nitrates.  It looks like cardiology put her back on lisinopril yesterday.  Her potassium level noted to be 5.4 today.  Looks like potassium was supplemented yesterday.  Will hold her lisinopril for now.  Recheck her labs tomorrow.  CAD s/p recent PCI Underwent stent placement at [place] in May.  Management as above.  Continue aspirin, Plavix, statins, Imdur and beta-blockers.  History of CVA On aspirin and Plavix.  No residual deficits.  Type II DM CBG's are reasonably well controlled.  Continue to monitor.    Normocytic anemia Continue B12 and folate supplements.  Hemoglobin is stable.  OSA on CPAP  Hyperlipidemia: Statins.  Obesity Estimated body mass index is 31.39 kg/m as calculated from the following:   Height as of this encounter: 4' 11.5" (1.511 m).   Weight as of this encounter: 71.7 kg.  Large hiatal hernia/GERD Continue Pepcid.   DVT prophylaxis: Lovenox Code Status: DNR Family Communication: Discussed with the patient. Disposition:  Status is: Inpatient  Remains inpatient appropriate because:Inpatient level of care appropriate due to severity of illness   Dispo: The patient is from: Home              Anticipated d/c is to: SNF              Anticipated d/c date is: 3 days              Patient currently is not medically stable to d/c.  Consultants:   Cardiology  Procedures:   None  Antimicrobials:   None   Subjective:  Patient states that she feels better.  Denies any shortness of breath this morning.  No chest pain.  No nausea vomiting.  Objective:   Vitals:   08/01/19 1610 08/01/19 1929 08/02/19 0606 08/02/19 0750  BP: (!) 116/56 (!) 103/48 (!) 116/45 (!) 130/42  Pulse: (!) 51 (!) 55 (!) 52 (!) 54  Resp: 18 20  17   Temp:  98.7 F (37.1 C)  98.6 F (37 C)  TempSrc: Oral Oral  Oral  SpO2: 92% 91% 93% 95%  Weight:      Height:         General appearance: Awake alert.  In no distress Resp: Good air entry bilaterally with few crackles at the bases.  No wheezing or rhonchi.   Cardio: S1-S2 is normal regular.  No S3-S4.  No rubs murmurs or bruit GI: Abdomen is soft.  Nontender nondistended.  Bowel sounds are present normal.  No masses organomegaly Extremities: No edema.  Full range of motion of lower extremities. Neurologic: Alert and oriented x3.  No focal neurological deficits.     Data Reviewed:   I have personally reviewed following labs and imaging studies   CBC: Recent Labs  Lab 07/31/19 1910 08/02/19 0711  WBC 5.3 4.6  NEUTROABS 3.5  --   HGB 11.2* 11.3*  HCT 34.2* 32.9*  MCV 87.7 86.1  PLT 163 751    Basic Metabolic Panel: Recent Labs  Lab 07/31/19 1910 08/01/19 0546 08/02/19 0711  NA 136 138 136  K 4.1 3.1* 5.4*  CL 99 98 97*  CO2 25 28 28   GLUCOSE 180* 96 110*  BUN 15 15 16   CREATININE 0.93 0.79 0.78  CALCIUM 9.0 8.9 9.1    Liver Function Tests: Recent Labs  Lab 07/31/19 1910  AST 19  ALT 15  ALKPHOS 83  BILITOT 0.6  PROT 6.2*  ALBUMIN 3.5    CBG: Recent Labs  Lab 08/01/19 1700 08/01/19 2056 08/02/19 0752  GLUCAP 97 209* 111*    Microbiology Studies:   Recent Results (from the past 240 hour(s))  SARS Coronavirus 2 by RT PCR (hospital order, performed in [facility] hospital lab) Nasopharyngeal Nasopharyngeal Swab     Status: None   Collection Time: 08/01/19 12:06 AM   Specimen: Nasopharyngeal Swab  Result Value Ref Range Status   SARS Coronavirus 2 NEGATIVE NEGATIVE Final    Comment: (NOTE) SARS-CoV-2 target nucleic acids are NOT DETECTED.  The SARS-CoV-2 RNA is generally detectable in upper and lower respiratory specimens during the acute phase of infection. The lowest concentration of SARS-CoV-2 viral copies this assay can detect is 250 copies / mL. A negative result does not preclude SARS-CoV-2 infection and should not be used as the sole basis for  treatment or other patient management decisions.  A negative result may occur with improper specimen collection / handling, submission of specimen other than nasopharyngeal swab, presence of viral mutation(s) within the areas targeted by this assay, and inadequate number of viral copies (<250 copies / mL). A negative result must be combined with clinical observations, patient history, and epidemiological information.  Fact Sheet for Patients:   StrictlyIdeas.no  Fact Sheet for Healthcare Providers: BankingDealers.co.za  This test is not yet approved or  cleared by the [place] FDA and has been authorized for detection and/or diagnosis of SARS-CoV-2 by FDA under an Emergency Use Authorization (EUA).  This EUA will  remain in effect (meaning this test can be used) for the duration of the COVID-19 declaration under Section 564(b)(1) of the Act, 21 U.S.C. section 360bbb-3(b)(1), unless the authorization is terminated or revoked sooner.  Performed at [facility], [address] [place], [place] [address]   MRSA PCR Screening     Status: None   Collection Time: 08/01/19  2:08 AM   Specimen: Nasal Mucosa; Nasopharyngeal  Result Value Ref Range Status   MRSA by PCR NEGATIVE NEGATIVE Final    Comment:        The GeneXpert MRSA Assay (FDA approved for NASAL specimens only), is one component of a comprehensive MRSA colonization surveillance program. It is not intended to diagnose MRSA infection nor to guide or monitor treatment for MRSA infections. Performed at [facility], [address] [place], [place] [address]      Radiology Studies:  DG Chest Portable 1 View  Result Date: 07/31/2019 CLINICAL DATA:  Chest pain, history of MI with stent placement in February EXAM: PORTABLE CHEST 1 VIEW COMPARISON:  Radiograph 03/14/2019 FINDINGS: There is cardiomegaly as well as a large hiatal hernia similar to  comparison. Calcified aorta noted as well. Remaining cardiomediastinal contours are unremarkable. Diffuse hazy opacities present throughout the lungs more pronounced in the perihilar and basilar distribution. Central vascular cuffing and peripheral septal lines are noted. Low lung volumes with atelectatic changes as well. No pneumothorax or visible effusion. No focal consolidative opacity. Degenerative changes are present in the imaged spine and shoulders. Features of prior avascular necrosis of the left humeral head are similar to comparison. IMPRESSION: 1. Cardiomegaly with findings most suggestive of interstitial edema. Constellation of findings are suggestive of CHF. 2. Large hiatal hernia. 3. Low lung volumes with atelectatic changes. 4. Features of prior avascular necrosis of the left humeral head. 5. Aortic Atherosclerosis (ICD10-I70.0). Electronically Signed   By: Lovena Le M.D.   On: 07/31/2019 19:40     Scheduled Meds:    aspirin EC  81 mg Oral Daily   clopidogrel  75 mg Oral Daily   enoxaparin (LOVENOX) injection  40 mg Subcutaneous Q24H   famotidine  20 mg Oral QHS   ferrous sulfate  325 mg Oral Q breakfast   FLUoxetine  20 mg Oral Daily   furosemide  40 mg Intravenous Daily   insulin aspart  0-9 Units Subcutaneous TID WC   isosorbide mononitrate  60 mg Oral Daily   lisinopril  10 mg Oral Daily   melatonin  5 mg Oral QHS   metoprolol succinate  25 mg Oral Daily   omega-3 acid ethyl esters  1 g Oral Daily   rosuvastatin  10 mg Oral QHS   senna-docusate  2 tablet Oral Daily   sodium chloride flush  3 mL Intravenous Q12H   cyanocobalamin  1,000 mcg Oral Daily    Continuous Infusions:    sodium chloride       LOS: 2 days     Myrla Malanowski Maryland Pink [facility]    To contact the attending provider between 7A-7P or the covering provider during after hours 7P-7A, please log into the web site [email or web address] and access using universal [place] password  for that web site. If you do not have the password, please call the hospital operator.  08/02/2019, 11:05 AM

## 2019-08-02 NOTE — Plan of Care (Signed)
  Problem: Education: Goal: Knowledge of General Education information will improve Description Including pain rating scale, medication(s)/side effects and non-pharmacologic comfort measures Outcome: Progressing   

## 2019-08-02 NOTE — Plan of Care (Signed)
?  Problem: Education: ?Goal: Ability to demonstrate management of disease process will improve ?Outcome: Progressing ?  ?

## 2019-08-03 ENCOUNTER — Inpatient Hospital Stay: Payer: Medicare Other

## 2019-08-03 DIAGNOSIS — I2510 Atherosclerotic heart disease of native coronary artery without angina pectoris: Secondary | ICD-10-CM

## 2019-08-03 DIAGNOSIS — I509 Heart failure, unspecified: Secondary | ICD-10-CM

## 2019-08-03 LAB — BASIC METABOLIC PANEL
Anion gap: 9 (ref 5–15)
BUN: 22 mg/dL (ref 8–23)
CO2: 29 mmol/L (ref 22–32)
Calcium: 9.1 mg/dL (ref 8.9–10.3)
Chloride: 97 mmol/L — ABNORMAL LOW (ref 98–111)
Creatinine, Ser: 0.88 mg/dL (ref 0.44–1.00)
GFR calc Af Amer: >60 mL/min (ref >60)
GFR calc non Af Amer: 59 mL/min — ABNORMAL LOW (ref >60)
Glucose, Bld: 121 mg/dL — ABNORMAL HIGH (ref 70–99)
Potassium: 4.4 mmol/L (ref 3.5–5.1)
Sodium: 135 mmol/L (ref 135–145)

## 2019-08-03 LAB — GLUCOSE, CAPILLARY
Glucose-Capillary: 100 mg/dL — ABNORMAL HIGH (ref 70–99)
Glucose-Capillary: 147 mg/dL — ABNORMAL HIGH (ref 70–99)
Glucose-Capillary: 174 mg/dL — ABNORMAL HIGH (ref 70–99)
Glucose-Capillary: 210 mg/dL — ABNORMAL HIGH (ref 70–99)

## 2019-08-03 MED ORDER — TRAMADOL HCL 50 MG PO TABS
50.0000 mg | ORAL_TABLET | Freq: Four times a day (QID) | ORAL | Status: DC | PRN
  Administered 2019-08-03 (×2): 50 mg via ORAL
  Filled 2019-08-03 (×2): qty 1

## 2019-08-03 MED ORDER — FUROSEMIDE 40 MG PO TABS
40.0000 mg | ORAL_TABLET | Freq: Every day | ORAL | Status: DC
  Administered 2019-08-04: 40 mg via ORAL
  Filled 2019-08-03: qty 1

## 2019-08-03 MED ORDER — LISINOPRIL 10 MG PO TABS
10.0000 mg | ORAL_TABLET | Freq: Every day | ORAL | Status: DC
  Administered 2019-08-03 – 2019-08-04 (×2): 10 mg via ORAL
  Filled 2019-08-03 (×2): qty 1

## 2019-08-03 MED ORDER — BISACODYL 10 MG RE SUPP
10.0000 mg | Freq: Once | RECTAL | Status: AC
  Administered 2019-08-03: 10 mg via RECTAL
  Filled 2019-08-03: qty 1

## 2019-08-03 NOTE — Consult Note (Signed)
ORTHOPAEDIC CONSULTATION  REQUESTING PHYSICIAN: Bonnielee Haff, MD  Chief Complaint: Right knee pain  HPI: Lydia King is a 86 y.o. female is being evaluated by orthopedics for complaints of right knee pain.  She states the pain involves the medial and lateral aspect of her knee.  She sustained a distal femoral periprosthetic fracture in February which was transferred to [facility] where it was fixed with ORIF by Dr. Micheal Likens.  Patient states approximately 4 to 6 weeks ago she had increasing pain in the right knee.  She states that her pain is exacerbated with weightbearing on the right lower extremity.  She has difficulty placing significant weight on the right lower extremity due to this pain.  Patient states she has not seen her surgeon at [facility] recently.  She denies any numbness or tingling in the right lower extremity.  She denies any recent injury.  Past Medical History:  Diagnosis Date  . Anemia    in past (from blood loss at hiatal hernia)  . Asthma   . Charcot-Marie-Tooth disease    walks with cane (fairly controlled)  . Diverticulosis   . DVT (deep venous thrombosis) ([place]) 2003   after sx  . GERD (gastroesophageal reflux disease)   . Grief reaction    on fluoxetine  . Headache(784.0)    migraine  . Hiatal hernia   . Hyperlipidemia   . Hypertension   . MI (myocardial infarction) ([place])   . OA (ocular albinism) ([place])   . Osteopenia   . PUD (peptic ulcer disease)    gastric, past  . Shingles   . Stroke ([place]) 8/10   posterior reversible encephalopathy syndrome  . Urine incontinence    Past Surgical History:  Procedure Laterality Date  . carotid dopplers  8/10  . COLONOSCOPY  2002  . HERNIA REPAIR  2002   bleeding  . HIATAL HERNIA REPAIR  2000   needed blood transfusion  . Stroke  8/10   hosp stroke/ posterior (vs posterior reversible encephalopathy syndrome) - d/c on Plavix  . TEE normal  8/93   no emoblic source  . TOTAL KNEE ARTHROPLASTY  03,06   Bilateral  .  VESICOVAGINAL FISTULA CLOSURE W/ TAH  1964   with bleeding and cervical dysplasia   Social History   Socioeconomic History  . Marital status: Widowed    Spouse name: Not on file  . Number of children: Not on file  . Years of education: Not on file  . Highest education level: Not on file  Occupational History  . Not on file  Tobacco Use  . Smoking status: Never Smoker  . Smokeless tobacco: Never Used  Vaping Use  . Vaping Use: Never used  Substance and Sexual Activity  . Alcohol use: No  . Drug use: No  . Sexual activity: Not Currently  Other Topics Concern  . Not on file  Social History Narrative   G6P4   Son died 2 years ago cerebral anerysm   Social Determinants of Health   Financial Resource Strain:   . Difficulty of Paying Living Expenses:   Food Insecurity:   . Worried About Charity fundraiser in the Last Year:   . Arboriculturist in the Last Year:   Transportation Needs:   . Film/video editor (Medical):   Marland Kitchen Lack of Transportation (Non-Medical):   Physical Activity:   . Days of Exercise per Week:   . Minutes of Exercise per Session:   Stress:   .  Feeling of Stress :   Social Connections:   . Frequency of Communication with Friends and Family:   . Frequency of Social Gatherings with Friends and Family:   . Attends Religious Services:   . Active Member of Clubs or Organizations:   . Attends Archivist Meetings:   Marland Kitchen Marital Status:    Family History  Problem Relation Age of Onset  . Cerebral aneurysm Son   . Cerebral aneurysm Father   . Hyperlipidemia Father   . Alcohol abuse Father   . Stroke Mother   . Cervical cancer Sister   . Hyperlipidemia Brother   . Alcohol abuse Brother   . Stroke Brother   . Diabetes Brother   . Diabetes Sister   . Sarcoidosis Sister   . Other Sister        OP/ Broken hip  . Other Other        whole family has charcot marie tooth  . Ovarian cancer Sister    Allergies  Allergen Reactions  . Morphine  And Related Anaphylaxis  . Shrimp [Shellfish Allergy] Anaphylaxis    Anaphylaxis-swelling of throat/sob    . Amlodipine Besylate     REACTION: edema  . Atorvastatin     REACTION: pain- could not walk  . Ciprofloxacin     REACTION: unkown  . Codeine     REACTION: itchy rash  . Influenza Vaccines Other (See Comments)    Arm swelling, redness, chills, fever  . Nitrofurantoin   . Omeprazole     REACTION: not effective  . Pantoprazole Sodium     REACTION: does not work well  . Penicillins     REACTION: itchy rash  . Simvastatin     REACTION: leg pain  . Sulfonamide Derivatives     REACTION: itchy rash   Prior to Admission medications   Medication Sig Start Date End Date Taking? Authorizing Provider  acetaminophen (TYLENOL) 500 MG tablet Take 1,000 mg by mouth every 8 (eight) hours as needed.   Yes [provider]  acetaminophen (TYLENOL) 650 MG CR tablet Take 650 mg by mouth every 8 (eight) hours as needed for pain.   Yes [provider]  alum & mag hydroxide-simeth (MAALOX/MYLANTA) 200-200-20 MG/5ML suspension Take 30 mLs by mouth in the morning, at noon, and at bedtime.   Yes [provider]  azelastine (ASTELIN) 0.1 % nasal spray Place 1 spray into both nostrils 2 (two) times daily. Use in each nostril as directed 08/26/17  Yes Cuthriell, Charline Bills, PA-C  Blood Glucose Monitoring Suppl (FREESTYLE LITE) DEVI Use to check blood glucose daily and as needed for diabetes 10/11/17  Yes Tower, Wynelle Fanny, MD  calcium-vitamin D (OSCAL WITH D) 500-200 MG-UNIT tablet Take 1 tablet by mouth daily with breakfast.   Yes [provider]  Cholecalciferol (VITAMIN D) 2000 UNITS CAPS Take 2 capsules by mouth daily.     Yes [provider]  clopidogrel (PLAVIX) 75 MG tablet TAKE 1 TABLET BY MOUTH EVERY DAY 07/26/19  Yes Tower, Wynelle Fanny, MD  cyanocobalamin 1000 MCG tablet Take 1,000 mcg by mouth daily.   Yes [provider]  esomeprazole (NEXIUM) 40 MG  capsule Take 1 capsule (40 mg total) by mouth daily before breakfast. 07/27/18  Yes Tower, Wynelle Fanny, MD  famotidine (PEPCID) 20 MG tablet Take 20 mg by mouth at bedtime.   Yes [provider]  ferrous sulfate 325 (65 FE) MG tablet Take 325 mg by mouth daily  with breakfast.   Yes [provider]  Flaxseed, Linseed, (FLAX SEED OIL) 1000 MG CAPS Take 1 capsule by mouth daily.     Yes [provider]  FLUoxetine (PROZAC) 20 MG capsule Take 1 capsule (20 mg total) by mouth daily. 07/27/18  Yes Tower, Marne A, MD  glucose blood (FREESTYLE LITE) test strip Use to check blood sugar once daily for DM (dx. B15.176) 11/08/18  Yes Tower, Wynelle Fanny, MD  isosorbide mononitrate (IMDUR) 30 MG 24 hr tablet Take 30 mg by mouth daily.   Yes [provider]  Lancets (FREESTYLE) lancets Use as instructedUse to check blood sugar once daily for DM (dx. H60.737) 11/08/18  Yes Tower, Wynelle Fanny, MD  lidocaine (LMX) 4 % cream Apply 1 application topically as needed.   Yes [provider]  loperamide (IMODIUM) 2 MG capsule Take 2 mg by mouth as needed for diarrhea or loose stools.   Yes [provider]  loratadine (CLARITIN) 10 MG tablet TAKE 1 TABLET BY MOUTH EVERY DAY 02/28/19  Yes Tower, Marne A, MD  melatonin 3 MG TABS tablet Take 3 mg by mouth at bedtime.   Yes [provider]  metFORMIN (GLUCOPHAGE) 1000 MG tablet Take 1 tablet (1,000 mg total) by mouth 2 (two) times daily with a meal. 07/27/18  Yes Tower, Wynelle Fanny, MD  metoprolol succinate (TOPROL-XL) 100 MG 24 hr tablet Take 100 mg by mouth daily. Take with or immediately following a meal.   Yes [provider]  metoprolol succinate (TOPROL-XL) 25 MG 24 hr tablet Take 25 mg by mouth daily.   Yes [provider]  Multiple Vitamin (MULTIVITAMIN) capsule Take 1 capsule by mouth daily.   Yes [provider]  Multiple Vitamins-Minerals (PRESERVISION AREDS 2+MULTI VIT PO) Take 1 tablet by mouth at  bedtime.   Yes [provider]  omega-3 acid ethyl esters (LOVAZA) 1 g capsule TAKE 1 CAPSULE (1 G TOTAL) BY MOUTH DAILY. 07/26/19  Yes Tower, Wynelle Fanny, MD  ondansetron (ZOFRAN) 4 MG tablet Take 4 mg by mouth every 4 (four) hours as needed for nausea or vomiting.   Yes [provider]  polyethylene glycol (MIRALAX / GLYCOLAX) 17 g packet Take 17 g by mouth daily as needed.   Yes [provider]  PROAIR HFA 108 (90 Base) MCG/ACT inhaler INHALE 2 PUFFS INTO THE LUNGS EVERY 4 (FOUR) HOURS AS NEEDED FOR WHEEZING OR SHORTNESS OF BREATH. 03/05/16  Yes Tower, Wynelle Fanny, MD  rosuvastatin (CRESTOR) 10 MG tablet Take 1 tablet (10 mg total) by mouth at bedtime. 07/27/18  Yes Tower, Wynelle Fanny, MD  sennosides-docusate sodium (SENOKOT-S) 8.6-50 MG tablet Take 2 tablets by mouth daily.   Yes [provider]  cetirizine (ZYRTEC) 10 MG tablet Take 1 tablet (10 mg total) by mouth daily. Patient not taking: Reported on 07/31/2019 08/26/17   Cuthriell, Charline Bills, PA-C  furosemide (LASIX) 20 MG tablet TAKE 2 TABLETS BY MOUTH EVERY DAY Patient not taking: Reported on 07/31/2019 07/26/19   Tower, Wynelle Fanny, MD  losartan (COZAAR) 100 MG tablet TAKE 1/2 TAB BY MOUTH ONCE A DAY Patient not taking: Reported on 07/31/2019 07/27/18   Tower, Wynelle Fanny, MD  Misc. Devices (WALKER) MISC Wheeled walker with seat- Pt must use for ambulation-- for use as directed: ambulation dx: 729.5, 780.79, 786.05, 784.0, 715.89.  Patient not taking: Reported on 07/31/2019    [provider]  predniSONE (DELTASONE) 10 MG tablet Take 1 pill by mouth  once daily for 5 days, then 1/2 pill once daily for 5 days Patient not taking: Reported on 07/31/2019 01/26/19   Abner Greenspan, MD   DG Knee Complete 4 Views Right  Result Date: 08/03/2019 CLINICAL DATA:  Right knee pain EXAM: RIGHT KNEE - COMPLETE 4+ VIEW COMPARISON:  03/14/2019 FINDINGS: Evidence of prior right knee replacement. Interval surgical plate and multiple screw fixation  of the mid to distal right femur across previously noted comminuted periprosthetic distal femoral fracture. Incomplete fracture healing with fracture lucency still visible. Approximate 7 mm gap between the surgical plate and cortex of the femur from the mid shaft to the level of the distal femoral prosthetic. IMPRESSION: 1. Previous right knee replacement. Interval surgical plate and multiple screw fixation of the mid to distal right femur across incompletely united distal femoral periprosthetic fracture. Suspected hardware failure with the surgical plate separated from the cortical surface of the femur from its visible portion at the midshaft, to the level of the distal femoral knee prosthetic with widest gap between the plate and cortex of the femur measuring 7 mm. Electronically Signed   By: Donavan Foil M.D.   On: 08/03/2019 16:53    Positive ROS: All other systems have been reviewed and were otherwise negative with the exception of those mentioned in the HPI and as above.  Physical Exam: General: Alert, no acute distress.  Patient is lying in her hospital bed.  MUSCULOSKELETAL: Right lower extremity: Patient has well-healed anterior incision from her previous total knee arthroplasty as well as a lateral incision from her distal femoral ORIF.  There is no erythema ecchymosis or significant effusion to the right knee.  She can actively flex and extend her knee while lying in bed without significant pain.  She has mild to moderate tenderness over the medial and lateral knee.  There is no obvious ligamentous laxity or movement to the fracture with gentle varus and valgus stress testing.  Her leg and thigh compartments are soft and compressible.  Distally she is neurovascular intact.  Assessment: Right knee pain in the setting of recent ORIF of a distal periprosthetic femur fracture  Plan: I reviewed the x-ray films taken in the hospital today.  The fracture is well aligned as is her total knee  prosthesis.  The radiologist report indicates there is some separation between the lateral plate and the cortex of the distal femur.  The patient's x-ray films from [facility] are not available for comparison.  It is difficult to determine if this is a change in hardware position.  Plates used for fracture fixation sometimes do sit off the bone and still provide adequate fixation.  At this point I would recommend the patient remain nonweightbearing on the right lower extremity given her pain with weightbearing.  Patient should use a knee immobilizer when standing to provide support to the right knee.  Patient demonstrated no pain with range of motion to the right knee when laying in bed and therefore the knee immobilizer can be removed when lying down.  Patient must use a walker to get around while remaining nonweightbearing on the right lower extremity.  Patient will need to follow-up with her surgeon at [facility] for further evaluation and management after discharge from [facility].  Patient will need to call to set up this appointment.  She understood and agreed with this plan.  The patient would benefit from PT evaluation to help her with her nonweightbearing status.  No immediate surgical intervention is necessary during this  hospitalization.  I have discussed this case with Dr. Maryland Pink earlier today.   [place]ton Park, MD    08/03/2019 9:01 PM

## 2019-08-03 NOTE — Progress Notes (Signed)
PROGRESS NOTE   Lydia King  NGE:[id number]    DOB: [birth year]    DOA: 07/31/2019  PCP: Abner Greenspan, MD     Brief Narrative:   86 y.o. female, currently SNF resident, ambulates with the help of a walker or cane, with medical history significant for CAD s/p stent, hypertension, aortic valve stenosis, CVA, OSA on CPAP, GI bleed, type 2 diabetes, hyperlipidemia who presents with chest pain similar to recent MI in May, associated with lower extremity edema.  Patient recently hospitalized at [facility] from 5/16 to 06/19/2019 for chest pain and underwent PCI with stenting of the LAD and ostial circumflex and had notable three-severe vessel CAD. She then was admitted again on 6/6 at [facility] for acute decompensated systolic heart failure treated with diuretics and discharged home with Lasix. Echocardiogram at that time showed EF of 55%, mild/moderate aortic stenosis mild MR and TR, trivial effusion.  Admitted for chest pain, acute on chronic diastolic CHF.  Cardiology consulted.   Assessment & Plan:   Chest pain in the setting of known coronary artery disease Symptoms were concerning for angina.  Cardiology was consulted.   Symptoms thought to be primarily due to CHF.  Levels were normal.  Patient being seen by cardiology.  No plans for cardiac catheterization.  Dose of metoprolol was decreased.  Nitrates are being continued.  IV diuretics changed to oral today.  Continue with antiplatelet agents and statin medications.   Acute on chronic diastolic CHF Patient has improved with IV diuretics.  She is saturating normal on room air.  Changed to oral today.  Discussed with cardiology.  Plan to discharge tomorrow.    Acute respiratory failure with hypoxia This was secondary to fluid overload from CHF.  Improved with her diuretics.  Currently saturating normal on room air.    Essential hypertension/hyperkalemia Blood pressure was poorly controlled initially.  Antihypertensives have been adjusted.   Metoprolol dose was decreased due to bradycardia.  Nitrates are being continued.  Lisinopril has been added.  Potassium level is normal today.  Recheck labs tomorrow.    CAD s/p recent PCI Underwent stent placement at [facility] in May.  Management as above.  Continue aspirin, Plavix, statins, Imdur and beta-blockers.  History of CVA On aspirin and Plavix.  No residual deficits.  Type II DM CBG's are reasonably well controlled.  Continue to monitor.    Normocytic anemia Continue B12 and folate supplements.  Hemoglobin is stable.  OSA on CPAP  Hyperlipidemia: Statins.  Obesity Estimated body mass index is 29.97 kg/m as calculated from the following:   Height as of this encounter: 4' 11.5" (1.511 m).   Weight as of this encounter: 68.4 kg.  Large hiatal hernia/GERD Continue Pepcid.   DVT prophylaxis: Lovenox Code Status: DNR Family Communication: Discussed with the patient. Disposition: Status is: Inpatient  Remains inpatient appropriate because:Inpatient level of care appropriate due to severity of illness   Dispo:  Patient From: Home  Planned Disposition: ALF  Expected discharge date: 08/04/19  Medically stable for discharge: No       Consultants:   Cardiology  Procedures:   None  Antimicrobials:   None   Subjective:  Patient denies any chest pain this morning.  Shortness of breath is improved.  No nausea vomiting.  Objective:   Vitals:   08/03/19 0542 08/03/19 0542 08/03/19 0721 08/03/19 1155  BP:  (!) 148/40 (!) 157/61 (!) 137/55  Pulse:  (!) 54 (!) 53 63  Resp:   17  17  Temp: 97.8 F (36.6 C)  (!) 97.5 F (36.4 C) 97.6 F (36.4 C)  TempSrc: Oral  Oral   SpO2:  91% 95% 95%  Weight:  68.4 kg    Height:        General appearance: Awake alert.  In no distress Resp: Normal effort at rest.  Good air entry bilaterally with a few crackles at the bases.  No wheezing or rhonchi.   Cardio: S1-S2 is normal regular.  No S3-S4.   GI: Abdomen is  soft.  Nontender nondistended.  Bowel sounds are present normal.  No masses organomegaly Extremities: No edema.  Full range of motion of lower extremities. Neurologic: Alert and oriented x3.  No focal neurological deficits.     Data Reviewed:      CBC: Recent Labs  Lab 07/31/19 1910 08/02/19 0711  WBC 5.3 4.6  NEUTROABS 3.5  --   HGB 11.2* 11.3*  HCT 34.2* 32.9*  MCV 87.7 86.1  PLT 163 952    Basic Metabolic Panel: Recent Labs  Lab 08/01/19 0546 08/02/19 0711 08/03/19 0441  NA 138 136 135  K 3.1* 5.4* 4.4  CL 98 97* 97*  CO2 28 28 29   GLUCOSE 96 110* 121*  BUN 15 16 22   CREATININE 0.79 0.78 0.88  CALCIUM 8.9 9.1 9.1    Liver Function Tests: Recent Labs  Lab 07/31/19 1910  AST 19  ALT 15  ALKPHOS 83  BILITOT 0.6  PROT 6.2*  ALBUMIN 3.5    CBG: Recent Labs  Lab 08/02/19 2055 08/03/19 0722 08/03/19 1153  GLUCAP 123* 100* 147*    Microbiology Studies:   Recent Results (from the past 240 hour(s))  SARS Coronavirus 2 by RT PCR (hospital order, performed in [facility] hospital lab) Nasopharyngeal Nasopharyngeal Swab     Status: None   Collection Time: 08/01/19 12:06 AM   Specimen: Nasopharyngeal Swab  Result Value Ref Range Status   SARS Coronavirus 2 NEGATIVE NEGATIVE Final    Comment: (NOTE) SARS-CoV-2 target nucleic acids are NOT DETECTED.  The SARS-CoV-2 RNA is generally detectable in upper and lower respiratory specimens during the acute phase of infection. The lowest concentration of SARS-CoV-2 viral copies this assay can detect is 250 copies / mL. A negative result does not preclude SARS-CoV-2 infection and should not be used as the sole basis for treatment or other patient management decisions.  A negative result may occur with improper specimen collection / handling, submission of specimen other than nasopharyngeal swab, presence of viral mutation(s) within the areas targeted by this assay, and inadequate number of viral copies (<250  copies / mL). A negative result must be combined with clinical observations, patient history, and epidemiological information.  Fact Sheet for Patients:   StrictlyIdeas.no  Fact Sheet for Healthcare Providers: BankingDealers.co.za  This test is not yet approved or  cleared by the [place] FDA and has been authorized for detection and/or diagnosis of SARS-CoV-2 by FDA under an Emergency Use Authorization (EUA).  This EUA will remain in effect (meaning this test can be used) for the duration of the COVID-19 declaration under Section 564(b)(1) of the Act, 21 U.S.C. section 360bbb-3(b)(1), unless the authorization is terminated or revoked sooner.  Performed at [facility], [address] [place], [place] [address]   MRSA PCR Screening     Status: None   Collection Time: 08/01/19  2:08 AM   Specimen: Nasal Mucosa; Nasopharyngeal  Result Value Ref Range Status   MRSA  by PCR NEGATIVE NEGATIVE Final    Comment:        The GeneXpert MRSA Assay (FDA approved for NASAL specimens only), is one component of a comprehensive MRSA colonization surveillance program. It is not intended to diagnose MRSA infection nor to guide or monitor treatment for MRSA infections. Performed at [facility], [address] [place], [place] [address]      Radiology Studies:  No results found.   Scheduled Meds:   . aspirin EC  81 mg Oral Daily  . clopidogrel  75 mg Oral Daily  . enoxaparin (LOVENOX) injection  40 mg Subcutaneous Q24H  . famotidine  20 mg Oral QHS  . ferrous sulfate  325 mg Oral Q breakfast  . FLUoxetine  20 mg Oral Daily  . [START ON 08/04/2019] furosemide  40 mg Oral Daily  . insulin aspart  0-9 Units Subcutaneous TID WC  . isosorbide mononitrate  60 mg Oral Daily  . lisinopril  10 mg Oral Daily  . melatonin  5 mg Oral QHS  . metoprolol succinate  25 mg Oral Daily  . omega-3 acid ethyl esters  1 g Oral  Daily  . rosuvastatin  10 mg Oral QHS  . senna-docusate  2 tablet Oral Daily  . sodium chloride flush  3 mL Intravenous Q12H  . cyanocobalamin  1,000 mcg Oral Daily    Continuous Infusions:   . sodium chloride       LOS: 3 days     [place] [facility]    To contact the attending provider between 7A-7P or the covering provider during after hours 7P-7A, please log into the web site [email or web address] and access using universal [place] password for that web site. If you do not have the password, please call the hospital operator.  08/03/2019, 1:27 PM

## 2019-08-03 NOTE — Care Management Important Message (Signed)
Important Message  Patient Details  Name: Lydia King MRN: [id number] Date of Birth: [birth year]   Medicare Important Message Given:  Yes     Dannette Barbara 08/03/2019, 11:07 AM

## 2019-08-03 NOTE — Progress Notes (Signed)
Progress Note  Patient Name: Lydia King Date of Encounter: 08/03/2019  [facility] Cardiologist: [facility]  Subjective   Doing well, shortness of breath improved, swelling also improved.  Patient was recently admitted about a month ago at [facility] for similar symptoms of volume overload.  Was in rehab when symptoms returned.  Daughter not sure of actual dose of Lasix as outpatient.  Right femur fracture with poor healing preventing ambulation.  Inpatient Medications    Scheduled Meds: . aspirin EC  81 mg Oral Daily  . clopidogrel  75 mg Oral Daily  . enoxaparin (LOVENOX) injection  40 mg Subcutaneous Q24H  . famotidine  20 mg Oral QHS  . ferrous sulfate  325 mg Oral Q breakfast  . FLUoxetine  20 mg Oral Daily  . furosemide  40 mg Intravenous Daily  . insulin aspart  0-9 Units Subcutaneous TID WC  . isosorbide mononitrate  60 mg Oral Daily  . lisinopril  10 mg Oral Daily  . melatonin  5 mg Oral QHS  . metoprolol succinate  25 mg Oral Daily  . omega-3 acid ethyl esters  1 g Oral Daily  . rosuvastatin  10 mg Oral QHS  . senna-docusate  2 tablet Oral Daily  . sodium chloride flush  3 mL Intravenous Q12H  . cyanocobalamin  1,000 mcg Oral Daily   Continuous Infusions: . sodium chloride     PRN Meds: sodium chloride, acetaminophen, albuterol, ondansetron (ZOFRAN) IV, polyethylene glycol, sodium chloride flush   Vital Signs    Vitals:   08/03/19 0542 08/03/19 0542 08/03/19 0721 08/03/19 1155  BP:  (!) 148/40 (!) 157/61 (!) 137/55  Pulse:  (!) 54 (!) 53 63  Resp:   17 17  Temp: 97.8 F (36.6 C)  (!) 97.5 F (36.4 C) 97.6 F (36.4 C)  TempSrc: Oral  Oral   SpO2:  91% 95% 95%  Weight:  68.4 kg    Height:        Intake/Output Summary (Last 24 hours) at 08/03/2019 1211 Last data filed at 08/03/2019 1044 Gross per 24 hour  Intake --  Output 2100 ml  Net -2100 ml   Last 3 Weights 08/03/2019 08/01/2019 07/31/2019  Weight (lbs) 150 lb 14.4 oz 158 lb 1.1 oz 165 lb  Weight (kg)  68.448 kg 71.7 kg 74.844 kg      Telemetry    Sinus bradycardia heart rate 59- Personally Reviewed  ECG    No new tracing- Personally Reviewed  Physical Exam   GEN: No acute distress.   Neck: No JVD Cardiac: RRR, systolic murmur.  Respiratory: Clear to auscultation bilaterally. GI: Soft, nontender, non-distended  MS: No edema; No deformity. Neuro:  Nonfocal  Psych: Normal affect   Labs    High Sensitivity Troponin:   Recent Labs  Lab 07/31/19 1910 08/01/19 0034  TROPONINIHS 13 13      Chemistry Recent Labs  Lab 07/31/19 1910 07/31/19 1910 08/01/19 0546 08/02/19 0711 08/03/19 0441  NA 136   < > 138 136 135  K 4.1   < > 3.1* 5.4* 4.4  CL 99   < > 98 97* 97*  CO2 25   < > 28 28 29   GLUCOSE 180*   < > 96 110* 121*  BUN 15   < > 15 16 22   CREATININE 0.93   < > 0.79 0.78 0.88  CALCIUM 9.0   < > 8.9 9.1 9.1  PROT 6.2*  --   --   --   --  ALBUMIN 3.5  --   --   --   --   AST 19  --   --   --   --   ALT 15  --   --   --   --   ALKPHOS 83  --   --   --   --   BILITOT 0.6  --   --   --   --   GFRNONAA 56*   < > >60 >60 59*  GFRAA >60   < > >60 >60 >60  ANIONGAP 12   < > 12 11 9    < > = values in this interval not displayed.     Hematology Recent Labs  Lab 07/31/19 1910 08/02/19 0711  WBC 5.3 4.6  RBC 3.90 3.82*  HGB 11.2* 11.3*  HCT 34.2* 32.9*  MCV 87.7 86.1  MCH 28.7 29.6  MCHC 32.7 34.3  RDW 15.6* 15.8*  PLT 163 186    BNP Recent Labs  Lab 07/31/19 1910  BNP 502.0*     DDimer No results for input(s): DDIMER in the last 168 hours.   Radiology    No results found.  Cardiac Studies   PCI (06/17/2019, [facility]): Successful PCI of the ostial LAD using 3.0 x 16 mm drug-eluting stent. Successful rotational atherectomy and PCI to ostial LCx using 3.0 x 16 mm drug-eluting stent.  LHC (06/15/2019, [facility]): Severe three-vessel coronary artery disease (further details not available).  Echocardiogram (06/15/2019, [facility]): LVEF greater than 55% with  mild LVH. Normal RV contraction. Mild MR. Moderate aortic stenosis. Mild to moderate TR  Patient Profile     86 y.o. female with history of CAD status post PCI to LAD and left circumflex, moderate AS, hypertension, HFpEF being seen for dyspnea and volume overload.  Assessment & Plan    1. HFpEF -Dyspnea and volume overload/edema improved -Switch from IV to p.o. Lasix 40 mg daily. -cont BB -should be on atleast 40mg  po lasix on discharge to prevent subsequent readmissions.  2. Hx of cad -cont imdur, BB, asa, statin  3. Moderate AS -cont to monitor     Signed, Kate Sable, MD  08/03/2019, 12:11 PM

## 2019-08-03 NOTE — Progress Notes (Signed)
Pt c/o R knee/ leg pain / states she fractured her right leg in Feb- but for the last month she can hardly walk on right leg due to pain/ daughter expressed concerns / MD made aware/ pain med given / will monitor.

## 2019-08-04 DIAGNOSIS — E1159 Type 2 diabetes mellitus with other circulatory complications: Secondary | ICD-10-CM

## 2019-08-04 DIAGNOSIS — E785 Hyperlipidemia, unspecified: Secondary | ICD-10-CM

## 2019-08-04 DIAGNOSIS — E1169 Type 2 diabetes mellitus with other specified complication: Secondary | ICD-10-CM

## 2019-08-04 DIAGNOSIS — G4733 Obstructive sleep apnea (adult) (pediatric): Secondary | ICD-10-CM

## 2019-08-04 LAB — BASIC METABOLIC PANEL
Anion gap: 11 (ref 5–15)
BUN: 28 mg/dL — ABNORMAL HIGH (ref 8–23)
CO2: 27 mmol/L (ref 22–32)
Calcium: 9 mg/dL (ref 8.9–10.3)
Chloride: 93 mmol/L — ABNORMAL LOW (ref 98–111)
Creatinine, Ser: 0.85 mg/dL (ref 0.44–1.00)
GFR calc Af Amer: >60 mL/min (ref >60)
GFR calc non Af Amer: >60 mL/min (ref >60)
Glucose, Bld: 131 mg/dL — ABNORMAL HIGH (ref 70–99)
Potassium: 4.2 mmol/L (ref 3.5–5.1)
Sodium: 131 mmol/L — ABNORMAL LOW (ref 135–145)

## 2019-08-04 LAB — GLUCOSE, CAPILLARY
Glucose-Capillary: 116 mg/dL — ABNORMAL HIGH (ref 70–99)
Glucose-Capillary: 154 mg/dL — ABNORMAL HIGH (ref 70–99)

## 2019-08-04 MED ORDER — ASPIRIN 81 MG PO TBEC: 81.0000 mg | DELAYED_RELEASE_TABLET | Freq: Every day | ORAL | 11 refills | Status: DC

## 2019-08-04 MED ORDER — FUROSEMIDE 40 MG PO TABS: 40.0000 mg | ORAL_TABLET | Freq: Every day | ORAL | Status: DC

## 2019-08-04 MED ORDER — LIDOCAINE 5 % EX PTCH
1.0000 | MEDICATED_PATCH | CUTANEOUS | Status: DC
  Administered 2019-08-04: 1 via TRANSDERMAL
  Filled 2019-08-04: qty 1

## 2019-08-04 MED ORDER — METOPROLOL SUCCINATE ER 25 MG PO TB24: 25.0000 mg | ORAL_TABLET | Freq: Every day | ORAL | Status: DC

## 2019-08-04 MED ORDER — TRAMADOL HCL 50 MG PO TABS: 50.0000 mg | ORAL_TABLET | Freq: Four times a day (QID) | ORAL | 0 refills | Status: DC | PRN

## 2019-08-04 MED ORDER — FUROSEMIDE 40 MG PO TABS
ORAL_TABLET | ORAL | 11 refills | Status: DC
Start: 2019-08-04 — End: 2019-08-26

## 2019-08-04 MED ORDER — ISOSORBIDE MONONITRATE ER 60 MG PO TB24: 60.0000 mg | ORAL_TABLET | Freq: Every day | ORAL | Status: DC

## 2019-08-04 MED ORDER — LISINOPRIL 10 MG PO TABS: 10.0000 mg | ORAL_TABLET | Freq: Every day | ORAL | Status: DC

## 2019-08-04 NOTE — TOC Transition Note (Signed)
Transition of Care [facility]) - CM/SW Discharge Note   Patient Details  Name: Lydia King MRN: [id number] Date of Birth: 10-20-1930  [facility] CM/SW Contact:  Eileen Stanford, LCSW Phone Number: 08/04/2019, 1:06 PM   Clinical Narrative:   Clinical Social Worker facilitated patient discharge including contacting patient family and facility to confirm patient discharge plans.  Clinical information faxed to facility and family agreeable with plan.  CSW arranged ambulance transport via [facility] to [facility].  [place]     Final next level of care: Skilled Nursing Facility Barriers to Discharge: No Barriers Identified   Patient Goals and CMS Choice        Discharge Placement              Patient chooses bed at: [facility] Patient to be transferred to facility by: [facility]   Patient and family notified of of transfer: 08/04/19  Discharge Plan and Services                                     Social Determinants of Health (SDOH) Interventions     Readmission Risk Interventions No flowsheet data found.

## 2019-08-04 NOTE — NC FL2 (Signed)
[place] LEVEL OF CARE SCREENING TOOL     IDENTIFICATION  Patient Name: Lydia King Birthdate: [birth year] Sex: female Admission Date (Current Location): 07/31/2019  [facility] and [place] Number:  Engineering geologist and Address:  [facility], [address], [place], [place] [address]      Provider Number: [id number]  Attending Physician Name and Address:  Bonnielee Haff, MD  Relative Name and Phone Number:       Current Level of Care: Hospital Recommended Level of Care: [place] Prior Approval Number:    Date Approved/Denied:   PASRR Number:    Discharge Plan: SNF    Current Diagnoses: Patient Active Problem List   Diagnosis Date Noted  . CAD (coronary artery disease) 08/01/2019  . Anemia 08/01/2019  . Acute on chronic congestive heart failure ([place]) 07/31/2019  . Acute sinusitis 01/26/2019  . Hyperlipidemia associated with type 2 diabetes mellitus ([place]) 07/27/2018  . Sinus congestion 02/05/2018  . Vertigo 05/06/2017  . OSA (obstructive sleep apnea) 01/02/2016  . Pedal edema 01/02/2016  . Fall in home 12/11/2015  . Poor balance 12/11/2015  . Low back pain with sciatica 10/02/2015  . Right hip pain 10/02/2015  . Hearing loss 04/18/2015  . Encounter for Medicare annual wellness exam 10/04/2013  . Diabetes mellitus ([place]) 08/31/2012  . History of CVA (cerebrovascular accident) 09/12/2008  . OSTEOARTHRITIS, MULTIPLE JOINTS 05/30/2008  . Osteopenia 05/30/2008  . Vitamin D deficiency 07/14/2007  . Sleep apnea 07/14/2007  . Essential hypertension 11/30/2006  . Asthma 11/30/2006  . H/O gastroesophageal reflux (GERD) 11/30/2006  . G I BLEED 11/30/2006  . DEEP VENOUS THROMBOPHLEBITIS, HX OF 11/30/2006    Orientation RESPIRATION BLADDER Height & Weight     Self, Time, Situation, Place  Normal External catheter (place 7/4) Weight: 154 lb 8 oz (70.1 kg) Height:  4' 11.5" (151.1 cm)  BEHAVIORAL SYMPTOMS/MOOD  NEUROLOGICAL BOWEL NUTRITION STATUS      Continent Diet (heart healthy)  AMBULATORY STATUS COMMUNICATION OF NEEDS Skin   Limited Assist Verbally Normal                       Personal Care Assistance Level of Assistance  Dressing, Feeding, Bathing Bathing Assistance: Limited assistance Feeding assistance: Independent Dressing Assistance: Limited assistance     Functional Limitations Info  Sight, Hearing, Speech Sight Info: Adequate Hearing Info: Adequate Speech Info: Adequate    SPECIAL CARE FACTORS FREQUENCY  PT (By licensed PT), OT (By licensed OT)     PT Frequency: 5x OT Frequency: 5x            Contractures Contractures Info: Not present    Additional Factors Info  Code Status, Allergies Code Status Info: DNR Allergies Info: Morphine And Related, Shrimp (Shellfish Allergy), Amlodipine Besylate, Atorvastatin, Ciprofloxacin, Codeine, Influenza Vaccines, Nitrofurantoin, Omeprazole, Pantoprazole Sodium, Penicillins, Simvastatin, Sulfonamide Derivatives           Current Medications (08/04/2019):  This is the current hospital active medication list Current Facility-Administered Medications  Medication Dose Route Frequency Provider Last Rate Last Admin  . 0.9 %  sodium chloride infusion  250 mL Intravenous PRN Tu, Ching T, DO      . acetaminophen (TYLENOL) tablet 650 mg  650 mg Oral Q4H PRN Tu, Ching T, DO   650 mg at 08/04/19 1048  . albuterol (PROVENTIL) (2.5 MG/3ML) 0.083% nebulizer solution 2.5 mg  2.5 mg Nebulization Q4H PRN Modena Jansky, MD      .  aspirin EC tablet 81 mg  81 mg Oral Daily Deboraha Sprang, MD   81 mg at 08/04/19 0836  . clopidogrel (PLAVIX) tablet 75 mg  75 mg Oral Daily Tu, Ching T, DO   75 mg at 08/04/19 0837  . enoxaparin (LOVENOX) injection 40 mg  40 mg Subcutaneous Q24H Tu, Ching T, DO   40 mg at 08/03/19 2340  . famotidine (PEPCID) tablet 20 mg  20 mg Oral QHS Tu, Ching T, DO   20 mg at 08/03/19 2341  . ferrous sulfate tablet 325 mg   325 mg Oral Q breakfast Tu, Ching T, DO   325 mg at 08/04/19 0837  . FLUoxetine (PROZAC) capsule 20 mg  20 mg Oral Daily Tu, Ching T, DO   20 mg at 08/04/19 1231  . furosemide (LASIX) tablet 40 mg  40 mg Oral Daily Kate Sable, MD   40 mg at 08/04/19 0837  . insulin aspart (novoLOG) injection 0-9 Units  0-9 Units Subcutaneous TID WC Tu, Ching T, DO   2 Units at 08/04/19 1231  . isosorbide mononitrate (IMDUR) 24 hr tablet 60 mg  60 mg Oral Daily Deboraha Sprang, MD   60 mg at 08/04/19 0836  . lidocaine (LIDODERM) 5 % 1 patch  1 patch Transdermal Q24H Bonnielee Haff, MD   1 patch at 08/04/19 1230  . lisinopril (ZESTRIL) tablet 10 mg  10 mg Oral Daily Bonnielee Haff, MD   10 mg at 08/04/19 0836  . melatonin tablet 5 mg  5 mg Oral QHS Tu, Ching T, DO   5 mg at 08/03/19 2340  . metoprolol succinate (TOPROL-XL) 24 hr tablet 25 mg  25 mg Oral Daily End, Christopher, MD   25 mg at 08/04/19 0837  . omega-3 acid ethyl esters (LOVAZA) capsule 1 g  1 g Oral Daily Tu, Ching T, DO   1 g at 08/04/19 0837  . ondansetron (ZOFRAN) injection 4 mg  4 mg Intravenous Q6H PRN Tu, Ching T, DO   4 mg at 08/04/19 1048  . polyethylene glycol (MIRALAX / GLYCOLAX) packet 17 g  17 g Oral Daily PRN Tu, Ching T, DO   17 g at 08/01/19 2008  . rosuvastatin (CRESTOR) tablet 10 mg  10 mg Oral QHS Tu, Ching T, DO   10 mg at 08/03/19 2341  . senna-docusate (Senokot-S) tablet 2 tablet  2 tablet Oral Daily Tu, Ching T, DO   2 tablet at 08/04/19 0837  . sodium chloride flush (NS) 0.9 % injection 3 mL  3 mL Intravenous Q12H Tu, Ching T, DO   3 mL at 08/04/19 5462  . sodium chloride flush (NS) 0.9 % injection 3 mL  3 mL Intravenous PRN Tu, Ching T, DO      . traMADol (ULTRAM) tablet 50 mg  50 mg Oral Q6H PRN Bonnielee Haff, MD   50 mg at 08/03/19 2034  . vitamin B-12 (CYANOCOBALAMIN) tablet 1,000 mcg  1,000 mcg Oral Daily Tu, Ching T, DO   1,000 mcg at 08/04/19 7035     Discharge Medications: Please see discharge summary for a  list of discharge medications.  Relevant Imaging Results:  Relevant Lab Results:   Additional Information    Asalee Barrette A Mckynzie Liwanag, LCSW

## 2019-08-04 NOTE — Discharge Summary (Signed)
[facility]  Physician Discharge Summary   Patient ID: Lydia King MRN: [id number] DOB/AGE: 1930-12-11 86 y.o.  Admit date: 07/31/2019 Discharge date: 08/04/2019  PCP: Abner Greenspan, MD  DISCHARGE DIAGNOSES:  Acute on chronic diastolic CHF Coronary artery disease Acute respiratory failure with hypoxia, resolved Essential hypertension History of stroke Diabetes mellitus type 2 Normocytic anemia Hyperlipidemia Right knee pain requiring follow-up with orthopedics at [facility]: 1. Patient to follow-up with orthopedic surgeon at [facility].  They have made an appointment for this coming Monday. 2. Please follow the heart failure discharge instructions.  Check weights every day. 3. Cardiology is arranging outpatient follow-up. 4. Please note change in the dose of Lasix.  Extra dose to be given in the afternoon if there is a 3 pound weight gain. 5. Continue with CPAP at nighttime. 6. Nonweightbearing status on right leg   Home Health: None Equipment/Devices: None  CODE STATUS: DNR  DISCHARGE CONDITION: fair  Diet recommendation: Low-sodium diet  INITIAL HISTORY:  86 y.o.female, currently SNF resident, ambulates with the help of a walker or cane,with medical history significant forCAD s/p stent, hypertension, aortic valve stenosis, CVA, OSA on CPAP, GI bleed, type 2 diabetes, hyperlipidemia who presents with chest pain similar to recent MI in May, associated with lower extremity edema.  Patient recently hospitalized at [facility] from 5/16 to 06/19/2019 for chest pain and underwent PCI with stenting of the LAD and ostial circumflex and had notable three-severe vessel CAD. She then was admittedagainon 6/6 at [facility] for acute decompensated systolic heart failure treated with diuretics and discharged home with Lasix. Echocardiogram at that time showed EF of 55%, mild/moderate aortic stenosis mild MR and TR, trivial effusion.  Admitted for chest  pain, acute on chronic diastolic CHF.  Cardiology consulted.  Consultations:  [facility] Cards  Dr. Geanie Cooley with [facility]:   Chest pain in the setting of known coronary artery disease Symptoms were concerning for angina.  Cardiology was consulted.   Symptoms thought to be primarily due to CHF.    Troponins were normal.  No plans for cardiac catheterization.  Dose of metoprolol was decreased due to bradycardia dose of nitrates were increased.  Continue with the aspirin and statin.  Lisinopril was also added.  Acute on chronic diastolic CHF Patient has improved with IV diuretics.  She is saturating normal on room air.  Changed to oral Lasix.  Labs are stable.  Okay for discharge today.  Acute respiratory failure with hypoxia, resolved This was secondary to fluid overload from CHF.  Improved with her diuretics.  Currently saturating normal on room air.    Essential hypertension/hyperkalemia Blood pressure was poorly controlled initially.  Antihypertensives have been adjusted.  Metoprolol dose was decreased due to bradycardia.  Nitrates are being continued.  Lisinopril has been added.    Potassium levels are stable.  CAD s/p recent PCI Underwent stent placement at [facility] in May.  Management as above.  Continue aspirin, Plavix, statins, Imdur and beta-blockers.  History of CVA On aspirin and Plavix.  No residual deficits.  Type II DM CBG's are reasonably well controlled.    Resume Metformin.  Normocytic anemia Continue B12 and folate supplements.  Hemoglobin is stable.  OSA on CPAP  Hyperlipidemia: Statins.  Obesity Estimated body mass index is 29.97 kg/m as calculated from the following:   Height as of this encounter: 4' 11.5" (1.511 m).   Weight as of this encounter: 68.4 kg.  Large hiatal hernia/GERD Continue Pepcid.  Right knee pain Patient sustained an injury back in February.  She had to be transferred to [facility] for surgery.  She  complained of significant knee pain ongoing for the past several weeks.  X-ray shows possible hardware malfunction.  Patient was seen by Dr. Christia Reading here.  Nonweightbearing status.  Follow-up with the orthopedist surgeon as [facility].  This can be done in the outpatient setting.  Patient's family has already made an appointment for her for this Monday.  Overall stable.  Discussed with patient and her daughter in detail.  Okay for discharge back to her skilled nursing facility today.   PERTINENT LABS:  The results of significant diagnostics from this hospitalization (including imaging, microbiology, ancillary and laboratory) are listed below for reference.    Microbiology: Recent Results (from the past 240 hour(s))  SARS Coronavirus 2 by RT PCR (hospital order, performed in [facility] hospital lab) Nasopharyngeal Nasopharyngeal Swab     Status: None   Collection Time: 08/01/19 12:06 AM   Specimen: Nasopharyngeal Swab  Result Value Ref Range Status   SARS Coronavirus 2 NEGATIVE NEGATIVE Final    Comment: (NOTE) SARS-CoV-2 target nucleic acids are NOT DETECTED.  The SARS-CoV-2 RNA is generally detectable in upper and lower respiratory specimens during the acute phase of infection. The lowest concentration of SARS-CoV-2 viral copies this assay can detect is 250 copies / mL. A negative result does not preclude SARS-CoV-2 infection and should not be used as the sole basis for treatment or other patient management decisions.  A negative result may occur with improper specimen collection / handling, submission of specimen other than nasopharyngeal swab, presence of viral mutation(s) within the areas targeted by this assay, and inadequate number of viral copies (<250 copies / mL). A negative result must be combined with clinical observations, patient history, and epidemiological information.  Fact Sheet for Patients:   StrictlyIdeas.no  Fact Sheet for Healthcare  Providers: BankingDealers.co.za  This test is not yet approved or  cleared by the [place] FDA and has been authorized for detection and/or diagnosis of SARS-CoV-2 by FDA under an Emergency Use Authorization (EUA).  This EUA will remain in effect (meaning this test can be used) for the duration of the COVID-19 declaration under Section 564(b)(1) of the Act, 21 U.S.C. section 360bbb-3(b)(1), unless the authorization is terminated or revoked sooner.  Performed at [facility], [address] [place], [place] [address]   MRSA PCR Screening     Status: None   Collection Time: 08/01/19  2:08 AM   Specimen: Nasal Mucosa; Nasopharyngeal  Result Value Ref Range Status   MRSA by PCR NEGATIVE NEGATIVE Final    Comment:        The GeneXpert MRSA Assay (FDA approved for NASAL specimens only), is one component of a comprehensive MRSA colonization surveillance program. It is not intended to diagnose MRSA infection nor to guide or monitor treatment for MRSA infections. Performed at [facility], [address] [place], [place] [address]      Labs:     Basic Metabolic Panel: Recent Labs  Lab 07/31/19 1910 08/01/19 0546 08/02/19 0711 08/03/19 0441 08/04/19 0443  NA 136 138 136 135 131*  K 4.1 3.1* 5.4* 4.4 4.2  CL 99 98 97* 97* 93*  CO2 25 28 28 29 27   GLUCOSE 180* 96 110* 121* 131*  BUN 15 15 16 22  28*  CREATININE 0.93 0.79 0.78 0.88 0.85  CALCIUM 9.0 8.9 9.1  9.1 9.0   Liver Function Tests: Recent Labs  Lab 07/31/19 1910  AST 19  ALT 15  ALKPHOS 83  BILITOT 0.6  PROT 6.2*  ALBUMIN 3.5   CBC: Recent Labs  Lab 07/31/19 1910 08/02/19 0711  WBC 5.3 4.6  NEUTROABS 3.5  --   HGB 11.2* 11.3*  HCT 34.2* 32.9*  MCV 87.7 86.1  PLT 163 186   BNP: BNP (last 3 results) Recent Labs    07/31/19 1910  BNP 502.0*     CBG: Recent Labs  Lab 08/03/19 0722 08/03/19 1153 08/03/19 1653 08/03/19 2203 08/04/19 0817    GLUCAP 100* 147* 174* 210* 116*     IMAGING STUDIES DG Chest Portable 1 View  Result Date: 07/31/2019 CLINICAL DATA:  Chest pain, history of MI with stent placement in February EXAM: PORTABLE CHEST 1 VIEW COMPARISON:  Radiograph 03/14/2019 FINDINGS: There is cardiomegaly as well as a large hiatal hernia similar to comparison. Calcified aorta noted as well. Remaining cardiomediastinal contours are unremarkable. Diffuse hazy opacities present throughout the lungs more pronounced in the perihilar and basilar distribution. Central vascular cuffing and peripheral septal lines are noted. Low lung volumes with atelectatic changes as well. No pneumothorax or visible effusion. No focal consolidative opacity. Degenerative changes are present in the imaged spine and shoulders. Features of prior avascular necrosis of the left humeral head are similar to comparison. IMPRESSION: 1. Cardiomegaly with findings most suggestive of interstitial edema. Constellation of findings are suggestive of CHF. 2. Large hiatal hernia. 3. Low lung volumes with atelectatic changes. 4. Features of prior avascular necrosis of the left humeral head. 5. Aortic Atherosclerosis (ICD10-I70.0). Electronically Signed   By: Lovena Le M.D.   On: 07/31/2019 19:40   DG Knee Complete 4 Views Right  Result Date: 08/03/2019 CLINICAL DATA:  Right knee pain EXAM: RIGHT KNEE - COMPLETE 4+ VIEW COMPARISON:  03/14/2019 FINDINGS: Evidence of prior right knee replacement. Interval surgical plate and multiple screw fixation of the mid to distal right femur across previously noted comminuted periprosthetic distal femoral fracture. Incomplete fracture healing with fracture lucency still visible. Approximate 7 mm gap between the surgical plate and cortex of the femur from the mid shaft to the level of the distal femoral prosthetic. IMPRESSION: 1. Previous right knee replacement. Interval surgical plate and multiple screw fixation of the mid to distal right  femur across incompletely united distal femoral periprosthetic fracture. Suspected hardware failure with the surgical plate separated from the cortical surface of the femur from its visible portion at the midshaft, to the level of the distal femoral knee prosthetic with widest gap between the plate and cortex of the femur measuring 7 mm. Electronically Signed   By: Donavan Foil M.D.   On: 08/03/2019 16:53    DISCHARGE EXAMINATION: Vitals:   08/03/19 1555 08/03/19 2037 08/04/19 0351 08/04/19 0815  BP: 122/76 (!) 124/48 (!) 129/56 (!) 139/58  Pulse: 64 66 62 63  Resp: 17  20 19   Temp: 97.7 F (36.5 C)  97.6 F (36.4 C) 97.8 F (36.6 C)  TempSrc: Oral   Oral  SpO2: 93% 95% 94% 96%  Weight:   70.1 kg   Height:       General appearance: Awake alert.  In no distress Resp: Clear to auscultation bilaterally.  Normal effort Cardio: S1-S2 is normal regular.  No S3-S4.  No rubs murmurs or bruit GI: Abdomen is soft.  Nontender nondistended.  Bowel sounds are present normal.  No masses organomegaly  DISPOSITION: SNF  Discharge Instructions    (HEART FAILURE PATIENTS) Call MD:  Anytime you have any of the following symptoms: 1) 3 pound weight gain in 24 hours or 5 pounds in 1 week 2) shortness of breath, with or without a dry hacking cough 3) swelling in the hands, feet or stomach 4) if you have to sleep on extra pillows at night in order to breathe.   Complete by: As directed    Call MD for:  difficulty breathing, headache or visual disturbances   Complete by: As directed    Call MD for:  extreme fatigue   Complete by: As directed    Call MD for:  persistant dizziness or light-headedness   Complete by: As directed    Call MD for:  persistant nausea and vomiting   Complete by: As directed    Call MD for:  severe uncontrolled pain   Complete by: As directed    Call MD for:  temperature >100.4   Complete by: As directed    Diet - low sodium heart healthy   Complete by: As directed     Discharge instructions   Complete by: As directed    Please review instructions on the discharge summary.  You were cared for by a hospitalist during your hospital stay. If you have any questions about your discharge medications or the care you received while you were in the hospital after you are discharged, you can call the unit and asked to speak with the hospitalist on call if the hospitalist that took care of you is not available. Once you are discharged, your primary care physician will handle any further medical issues. Please note that NO REFILLS for any discharge medications will be authorized once you are discharged, as it is imperative that you return to your primary care physician (or establish a relationship with a primary care physician if you do not have one) for your aftercare needs so that they can reassess your need for medications and monitor your lab values. If you do not have a primary care physician, you can call [phone] for a physician referral.   Increase activity slowly   Complete by: As directed         Allergies as of 08/04/2019      Reactions   Morphine And Related Anaphylaxis   Shrimp [shellfish Allergy] Anaphylaxis   Anaphylaxis-swelling of throat/sob    Amlodipine Besylate    REACTION: edema   Atorvastatin    REACTION: pain- could not walk   Ciprofloxacin    REACTION: unkown   Codeine    REACTION: itchy rash   Influenza Vaccines Other (See Comments)   Arm swelling, redness, chills, fever   Nitrofurantoin    Omeprazole    REACTION: not effective   Pantoprazole Sodium    REACTION: does not work well   Penicillins    REACTION: itchy rash   Simvastatin    REACTION: leg pain   Sulfonamide Derivatives    REACTION: itchy rash      Medication List    STOP taking these medications   cetirizine 10 MG tablet Commonly known as: ZYRTEC   losartan 100 MG tablet Commonly known as: COZAAR   multivitamin capsule   predniSONE 10 MG tablet Commonly known  as: DELTASONE     TAKE these medications   acetaminophen 500 MG tablet Commonly known as: TYLENOL Take 1,000 mg by mouth every 8 (eight) hours as needed. What changed: Another medication with the same  name was removed. Continue taking this medication, and follow the directions you see here.   alum & mag hydroxide-simeth 200-200-20 MG/5ML suspension Commonly known as: MAALOX/MYLANTA Take 30 mLs by mouth in the morning, at noon, and at bedtime.   aspirin 81 MG EC tablet Take 1 tablet (81 mg total) by mouth daily. Swallow whole. Start taking on: August 05, 2019   azelastine 0.1 % nasal spray Commonly known as: ASTELIN Place 1 spray into both nostrils 2 (two) times daily. Use in each nostril as directed   calcium-vitamin D 500-200 MG-UNIT tablet Commonly known as: OSCAL WITH D Take 1 tablet by mouth daily with breakfast.   clopidogrel 75 MG tablet Commonly known as: PLAVIX TAKE 1 TABLET BY MOUTH EVERY DAY   cyanocobalamin 1000 MCG tablet Take 1,000 mcg by mouth daily.   esomeprazole 40 MG capsule Commonly known as: NEXIUM Take 1 capsule (40 mg total) by mouth daily before breakfast.   famotidine 20 MG tablet Commonly known as: PEPCID Take 20 mg by mouth at bedtime.   ferrous sulfate 325 (65 FE) MG tablet Take 325 mg by mouth daily with breakfast.   Flax Seed Oil 1000 MG Caps Take 1 capsule by mouth daily.   FLUoxetine 20 MG capsule Commonly known as: PROZAC Take 1 capsule (20 mg total) by mouth daily.   freestyle lancets Use as instructedUse to check blood sugar once daily for DM (dx. E11.618)   FreeStyle Lite Devi Use to check blood glucose daily and as needed for diabetes   FREESTYLE LITE test strip Generic drug: glucose blood Use to check blood sugar once daily for DM (dx. E11.618)   furosemide 40 MG tablet Commonly known as: LASIX Take 1 tablet (40 mg total) by mouth daily. What changed: medication strength   furosemide 40 MG tablet Commonly known as:  Lasix Take an extra 40mg  in the afternoon as needed if 3 pound weight gain is noted. What changed: You were already taking a medication with the same name, and this prescription was added. Make sure you understand how and when to take each.   isosorbide mononitrate 60 MG 24 hr tablet Commonly known as: IMDUR Take 1 tablet (60 mg total) by mouth daily. What changed:   medication strength  how much to take   lidocaine 4 % cream Commonly known as: LMX Apply 1 application topically as needed.   lisinopril 10 MG tablet Commonly known as: ZESTRIL Take 1 tablet (10 mg total) by mouth daily. Start taking on: August 05, 2019   loperamide 2 MG capsule Commonly known as: IMODIUM Take 2 mg by mouth as needed for diarrhea or loose stools.   loratadine 10 MG tablet Commonly known as: CLARITIN TAKE 1 TABLET BY MOUTH EVERY DAY   melatonin 3 MG Tabs tablet Take 3 mg by mouth at bedtime.   metFORMIN 1000 MG tablet Commonly known as: GLUCOPHAGE Take 1 tablet (1,000 mg total) by mouth 2 (two) times daily with a meal.   metoprolol succinate 25 MG 24 hr tablet Commonly known as: TOPROL-XL Take 1 tablet (25 mg total) by mouth daily. Start taking on: August 05, 2019 What changed:   medication strength  how much to take  additional instructions  Another medication with the same name was removed. Continue taking this medication, and follow the directions you see here.   omega-3 acid ethyl esters 1 g capsule Commonly known as: LOVAZA TAKE 1 CAPSULE (1 G TOTAL) BY MOUTH DAILY.   ondansetron 4  MG tablet Commonly known as: ZOFRAN Take 4 mg by mouth every 4 (four) hours as needed for nausea or vomiting.   polyethylene glycol 17 g packet Commonly known as: MIRALAX / GLYCOLAX Take 17 g by mouth daily as needed.   PRESERVISION AREDS 2+MULTI VIT PO Take 1 tablet by mouth at bedtime.   ProAir HFA 108 (90 Base) MCG/ACT inhaler Generic drug: albuterol INHALE 2 PUFFS INTO THE LUNGS EVERY 4  (FOUR) HOURS AS NEEDED FOR WHEEZING OR SHORTNESS OF BREATH.   rosuvastatin 10 MG tablet Commonly known as: CRESTOR Take 1 tablet (10 mg total) by mouth at bedtime.   sennosides-docusate sodium 8.6-50 MG tablet Commonly known as: SENOKOT-S Take 2 tablets by mouth daily.   traMADol 50 MG tablet Commonly known as: ULTRAM Take 1 tablet (50 mg total) by mouth every 6 (six) hours as needed for moderate pain.   Vitamin D 50 MCG (2000 UT) Caps Take 2 capsules by mouth daily.   Walker Misc Wheeled walker with seat- Pt must use for ambulation-- for use as directed: ambulation dx: 729.5, 780.79, 786.05, 784.0, 715.89.         Follow-up Information    [facility] Follow up on 08/29/2019.   Specialty: Cardiology Why: at 12:30pm. Enter through the [place] entrance Contact information: [place] [phone]       Abner Greenspan, MD. Schedule an appointment as soon as possible for a visit in 1 week(s).   Specialties: Family Medicine, Radiology Contact information: [address] [place] [address] [phone]               TOTAL DISCHARGE TIME: 35 minutes  [place]  [facility] Pager on [email or web address]  08/04/2019, 11:21 AM

## 2019-08-04 NOTE — Progress Notes (Signed)
Progress Note  Patient Name: Lydia King Date of Encounter: 08/04/2019  [facility] Cardiologist:  Rockey Situ  Subjective   Denies significant shortness of breath on todays visit Leg swelling has resolved, abdomen less distended Daughter at the bedside, long discussion concerning CHF management She denies excessive fluid intake but may not been taking Lasix on a regular basis at the nursing facility, [facility]   Inpatient Medications    Scheduled Meds: . aspirin EC  81 mg Oral Daily  . clopidogrel  75 mg Oral Daily  . enoxaparin (LOVENOX) injection  40 mg Subcutaneous Q24H  . famotidine  20 mg Oral QHS  . ferrous sulfate  325 mg Oral Q breakfast  . FLUoxetine  20 mg Oral Daily  . furosemide  40 mg Oral Daily  . insulin aspart  0-9 Units Subcutaneous TID WC  . isosorbide mononitrate  60 mg Oral Daily  . lisinopril  10 mg Oral Daily  . melatonin  5 mg Oral QHS  . metoprolol succinate  25 mg Oral Daily  . omega-3 acid ethyl esters  1 g Oral Daily  . rosuvastatin  10 mg Oral QHS  . senna-docusate  2 tablet Oral Daily  . sodium chloride flush  3 mL Intravenous Q12H  . cyanocobalamin  1,000 mcg Oral Daily   Continuous Infusions: . sodium chloride     PRN Meds: sodium chloride, acetaminophen, albuterol, ondansetron (ZOFRAN) IV, polyethylene glycol, sodium chloride flush, traMADol   Vital Signs    Vitals:   08/03/19 1555 08/03/19 2037 08/04/19 0351 08/04/19 0815  BP: 122/76 (!) 124/48 (!) 129/56 (!) 139/58  Pulse: 64 66 62 63  Resp: 17  20 19   Temp: 97.7 F (36.5 C)  97.6 F (36.4 C) 97.8 F (36.6 C)  TempSrc: Oral   Oral  SpO2: 93% 95% 94% 96%  Weight:   70.1 kg   Height:        Intake/Output Summary (Last 24 hours) at 08/04/2019 1051 Last data filed at 08/04/2019 0351 Gross per 24 hour  Intake 120 ml  Output 1250 ml  Net -1130 ml   Last 3 Weights 08/04/2019 08/03/2019 08/01/2019  Weight (lbs) 154 lb 8 oz 150 lb 14.4 oz 158 lb 1.1 oz  Weight (kg)  70.081 kg 68.448 kg 71.7 kg      Telemetry    Normal sinus rhythm- Personally Reviewed  ECG     - Personally Reviewed  Physical Exam   GEN: No acute distress.   Neck: No JVD Cardiac: RRR, 2/6 systolic ejection murmur right sternal border No rubs, or gallops.  Respiratory: Clear to auscultation bilaterally. GI: Soft, nontender, non-distended  MS: No edema; No deformity. Neuro:  Nonfocal  Psych: Normal affect   Labs    High Sensitivity Troponin:   Recent Labs  Lab 07/31/19 1910 08/01/19 0034  TROPONINIHS 13 13      Chemistry Recent Labs  Lab 07/31/19 1910 08/01/19 0546 08/02/19 0711 08/03/19 0441 08/04/19 0443  NA 136   < > 136 135 131*  K 4.1   < > 5.4* 4.4 4.2  CL 99   < > 97* 97* 93*  CO2 25   < > 28 29 27   GLUCOSE 180*   < > 110* 121* 131*  BUN 15   < > 16 22 28*  CREATININE 0.93   < > 0.78 0.88 0.85  CALCIUM 9.0   < > 9.1 9.1 9.0  PROT 6.2*  --   --   --   --  ALBUMIN 3.5  --   --   --   --   AST 19  --   --   --   --   ALT 15  --   --   --   --   ALKPHOS 83  --   --   --   --   BILITOT 0.6  --   --   --   --   GFRNONAA 56*   < > >60 59* >60  GFRAA >60   < > >60 >60 >60  ANIONGAP 12   < > 11 9 11    < > = values in this interval not displayed.     Hematology Recent Labs  Lab 07/31/19 1910 08/02/19 0711  WBC 5.3 4.6  RBC 3.90 3.82*  HGB 11.2* 11.3*  HCT 34.2* 32.9*  MCV 87.7 86.1  MCH 28.7 29.6  MCHC 32.7 34.3  RDW 15.6* 15.8*  PLT 163 186    BNP Recent Labs  Lab 07/31/19 1910  BNP 502.0*     DDimer No results for input(s): DDIMER in the last 168 hours.   Radiology    DG Knee Complete 4 Views Right  Result Date: 08/03/2019 CLINICAL DATA:  Right knee pain EXAM: RIGHT KNEE - COMPLETE 4+ VIEW COMPARISON:  03/14/2019 FINDINGS: Evidence of prior right knee replacement. Interval surgical plate and multiple screw fixation of the mid to distal right femur across previously noted comminuted periprosthetic distal femoral fracture.  Incomplete fracture healing with fracture lucency still visible. Approximate 7 mm gap between the surgical plate and cortex of the femur from the mid shaft to the level of the distal femoral prosthetic. IMPRESSION: 1. Previous right knee replacement. Interval surgical plate and multiple screw fixation of the mid to distal right femur across incompletely united distal femoral periprosthetic fracture. Suspected hardware failure with the surgical plate separated from the cortical surface of the femur from its visible portion at the midshaft, to the level of the distal femoral knee prosthetic with widest gap between the plate and cortex of the femur measuring 7 mm. Electronically Signed   By: Donavan Foil M.D.   On: 08/03/2019 16:53    Cardiac Studies   PCI (06/17/2019, [facility]): Successful PCI of the ostial LAD using 3.0 x 16 mm drug-eluting stent. Successful rotational atherectomy and PCI to ostial LCx using 3.0 x 82mm drug-eluting stent.  LHC (06/15/2019, [facility]): Severe three-vessel coronary artery disease (further details not available).  Echocardiogram (06/15/2019, [facility]): LVEF greater than 55% with mild LVH. Normal RV contraction. Mild MR. Moderate aortic stenosis. Mild to moderate TR  Patient Profile     86 y.o. female with history of CAD status post PCI to LAD and left circumflex, moderate AS, hypertension, HFpEF being seen for dyspnea and volume overload.  Assessment & Plan    Acute on chronic diastolic CHF Euvolemic state at this time, BUN actually starting to bump on IV Lasix -Continue Lasix 40 daily oral dosing -Recommend extra Lasix 40 after lunch for 3 pound weight gain Discussed with daughter, she will monitor weights daily at the nursing facility -Discussed other signs of fluid retention, abdominal bloating, leg swelling, coughing when supine/PND orthopnea -Symptoms possibly exacerbated by body habitus/obesity with aortic valve stenosis -Continue metoprolol succinate at current  dose --- Need order for low-salt diet and extra Lasix 40 mg in the afternoon as needed for 3 pound weight gain for [facility]  2.  Coronary disease with stable angina Three-vessel coronary disease on  recent catheterization May 2021, stent placed to LAD and left circumflex --Aspirin Plavix statin Denies anginal symptoms No further work-up at this time  3.  Aortic valve stenosis Continue to monitor as outpatient, yearly echo  4.  Right femur fracture Slow to recover, needing assistance with ambulation, going back to [facility] Needs physical therapy, daily weights   Started CHF education, Stressed need for daily weights, extra Lasix in the afternoon for 3 pound weight gain Close follow-up in clinic in 7 to 14 days  Total encounter time more than 35 minutes  Greater than 50% was spent in counseling and coordination of care with the patient   For questions or updates, please contact [place] Please consult [email or web address] for contact info under        Signed, Ida Rogue, MD  08/04/2019, 10:51 AM

## 2019-08-04 NOTE — Discharge Instructions (Signed)

## 2019-08-04 NOTE — Evaluation (Signed)
Physical Therapy Evaluation Patient Details Name: Lydia King MRN: [id number] DOB: 19-Apr-1930 Today's Date: 08/04/2019   History of Present Illness  Pt is an 86 y.o. female presenting to hospital 7/4 with chest discomfort and SOB.  Of note, pt with h/o distal femoral periprosthetic fx s/p ORIF February 2021 at [facility] (by Dr. Tiajuana Amass recommends NWB to R LE at this time and KI.  Pt admitted with acute on chronic systolic heart failure exacerbation, acute hypoxic respiratory failure, and htn.  PMH includes anemia, Charcot-Marie-Tooth disease, DVT, HA, large hiatal hernia, recent h/o MI, CPAP at night, htn, shingles, stroke, vertigo, B TKA.  Clinical Impression  Prior to hospital admission, pt was at [facility]; was able to perform stand pivot transfers on own and required assist to ambulate short distances with RW (pt was able to put weight through R LE prior to hospitalization but was having increased pain and now is NWB'ing R LE in knee immobilizer).  Pt reporting no pain in R LE at rest in bed (although did report R knee pain with activity with KI donned); pt also reported 6/10 L knee pain at rest (pt reports difficulty with transfer about 1 month ago and has had L knee pain since).  Currently pt is min to mod assist with stand pivot transfers and pt unable to advance L LE to ambulate with B platform RW (pt reporting having B hand/wrist and UE weakness and previously did better with use of B platform walker so trialed B platform walker during session).  Pt would benefit from skilled PT to address noted impairments and functional limitations (see below for any additional details).  Upon hospital discharge, pt would benefit from STR.    Follow Up Recommendations SNF    Equipment Recommendations  Other (comment) (TBD at next facility)    Recommendations for Other Services OT consult     Precautions / Restrictions Precautions Precautions: Fall Precaution Comments: Knee immobilizer can be removed when  lying down per Orthopedic note 08/03/19 Required Braces or Orthoses: Knee Immobilizer - Right Knee Immobilizer - Right: On when out of bed or walking Restrictions Weight Bearing Restrictions: Yes RLE Weight Bearing: Non weight bearing      Mobility  Bed Mobility Overal bed mobility: Modified Independent             General bed mobility comments: HOB elevated; sit to semi-supine in bed; mild increased time to perform on own  Transfers Overall transfer level: Needs assistance Equipment used: Rolling walker (2 wheeled) Transfers: Sit to/from [facility] Sit to Stand: Min assist;Mod assist Stand pivot transfers: Min assist;Mod assist       General transfer comment: stand pivot recliner to [facility] towards L with min assist; stand pivot BSC to bed towards L with mod assist; assist and cueing required to maintain [place] status; vc's required for positioning/technique  Ambulation/Gait Ambulation/Gait assistance: Mod assist;Max assist   Assistive device: Bilateral platform walker       General Gait Details: pt unable to clear L LE from floor to advance L LE (but able to maintain [place] R LE) and then pt's L knee started to flex and pt unable to return to upright with assist so pt assisted back to sitting onto [facility] and then pt performed stand pivot back to bed  Stairs            Wheelchair Mobility    Modified Rankin (Stroke Patients Only)       Balance Overall balance assessment: Needs assistance  Sitting-balance support: No upper extremity supported;Feet unsupported Sitting balance-Leahy Scale: Good Sitting balance - Comments: steady sitting reaching within BOS   Standing balance support: Bilateral upper extremity supported Standing balance-Leahy Scale: Poor Standing balance comment: heavy UE support required through B platform walker to maintain standing                             Pertinent Vitals/Pain Pain Assessment: Faces Pain  Score: 6  Pain Location: B knees Pain Descriptors / Indicators: Aching;Sore Pain Intervention(s): Limited activity within patient's tolerance;Monitored during session;Repositioned;RN gave pain meds during session    Carlos expects to be discharged to:: Skilled nursing facility                      Prior Function Level of Independence: Needs assistance   Gait / Transfers Assistance Needed: Pt recently able to pivot transfer to [facility] on her own but required assist for ambulating short distances (to bathroom) with RW at facility; also use of w/c and staff assist with w/c propulsion  ADL's / Homemaking Assistance Needed: Per OT eval "States she can do seated UB ADLs with setup/cues (low vision at baseline). States that PCAs take her to shower 2x/wk, but typically assist pt with bed bath at this time. If pt to shower, they use rolling shower chair. States she has AE for LB Dressing at home and is somewhat familiar with use, but so far, has not used at facility."        Hand Dominance        Extremity/Trunk Assessment   Upper Extremity Assessment Upper Extremity Assessment: Defer to OT evaluation    Lower Extremity Assessment Lower Extremity Assessment: Generalized weakness RLE Deficits / Details: R LE in knee immobilizer (not assessed)    Cervical / Trunk Assessment Cervical / Trunk Assessment: Normal  Communication   Communication: No difficulties  Cognition Arousal/Alertness: Awake/alert Behavior During Therapy: WFL for tasks assessed/performed Overall Cognitive Status: Within Functional Limits for tasks assessed                                 General Comments: Mild increased time to respond at times but overall appropriate and oriented      General Comments General comments (skin integrity, edema, etc.): R KI in place upon PT arrival.  Nursing cleared pt for participation in physical therapy.  Pt agreeable to PT session.  Pt's  family member present during session.    Exercises    Assessment/Plan    PT Assessment Patient needs continued PT services  PT Problem List Decreased strength;Decreased range of motion;Decreased activity tolerance;Decreased balance;Decreased mobility;Decreased knowledge of use of DME;Decreased knowledge of precautions;Pain       PT Treatment Interventions DME instruction;Gait training;Functional mobility training;Therapeutic activities;Therapeutic exercise;Balance training;Patient/family education    PT Goals (Current goals can be found in the Care Plan section)  Acute Rehab PT Goals Patient Stated Goal: to improve mobility and be able to walk with less pain PT Goal Formulation: With patient Time For Goal Achievement: 08/18/19 Potential to Achieve Goals: Fair    Frequency Min 2X/week   Barriers to discharge Decreased caregiver support      Co-evaluation               AM-PAC PT "6 Clicks" Mobility  Outcome Measure Help needed turning from your back to your side  while in a flat bed without using bedrails?: None Help needed moving from lying on your back to sitting on the side of a flat bed without using bedrails?: None Help needed moving to and from a bed to a chair (including a wheelchair)?: A Lot Help needed standing up from a chair using your arms (e.g., wheelchair or bedside chair)?: A Lot Help needed to walk in hospital room?: Total Help needed climbing 3-5 steps with a railing? : Total 6 Click Score: 14    End of Session Equipment Utilized During Treatment: Gait belt Activity Tolerance: Patient tolerated treatment well Patient left: in bed;with call bell/phone within reach;with bed alarm set;with family/visitor present Nurse Communication: Mobility status;Precautions;Weight bearing status PT Visit Diagnosis: Other abnormalities of gait and mobility (R26.89);Muscle weakness (generalized) (M62.81);History of falling (Z91.81);Difficulty in walking, not elsewhere  classified (R26.2);Pain Pain - Right/Left: Right Pain - part of body: Knee    Time: 1207-1245 PT Time Calculation (min) (ACUTE ONLY): 38 min   Charges:   PT Evaluation $PT Eval Low Complexity: 1 Low PT Treatments $Therapeutic Activity: 23-37 mins       Leitha Bleak, PT 08/04/19, 2:09 PM

## 2019-08-04 NOTE — Progress Notes (Signed)
Report given to nurse receiving patient at [facility]. Pt will be transported by EMS to SNF.

## 2019-08-04 NOTE — Evaluation (Signed)
Occupational Therapy Evaluation Patient Details Name: Lydia King MRN: [id number] DOB: 1930/03/26 Today's Date: 08/04/2019    History of Present Illness Per MD note: 86 y.o. female, currently SNF resident, ambulates with the help of a walker or cane, with medical history significant for CAD s/p stent, hypertension, aortic valve stenosis, CVA, OSA on CPAP, GI bleed, type 2 diabetes, hyperlipidemia who presents with chest pain similar to recent MI in May, associated with lower extremity edema.  Patient recently hospitalized at [facility] from 5/16 to 06/19/2019 for chest pain and underwent PCI with stenting of the LAD and ostial circumflex and had notable three-severe vessel CAD. She then was admitted again on 6/6 at [facility] for acute decompensated systolic heart failure treated with diuretics and discharged home with Lasix. Ortho also consulted d/t c/o R knee pain. Per Ortho note: She sustained a distal femoral periprosthetic fracture in February which was transferred to [facility] where it was fixed with ORIF by Dr. Micheal Likens.  Patient states approximately 4 to 6 weeks ago she had increasing pain in the right knee. MD recommends NWB to R LE at this time and KI.   Clinical Impression   Pt was seen for OT evaluation this date. Prior to hospital admission, pt was requiring some assist for bathing and dressing LB at rehab facility, was pivoting herself onto [facility], was able to somewhat use 2WW for fxl mobility with little to no assistance, but for longer distances-was being propelled in w/c by staff. Pt was staying at [facility] following last hospitalization and since initial sx in Feb, has been at 3 different facilities (pt's daughter reports total of 7 hospitalizations in 2021 so far). Currently pt demonstrates impairments as described below (See OT problem list) which functionally limit her ability to perform ADL/self-care tasks. Pt currently requires MAX A for LB ADLs, MIN to MIN/MOD A with RW for ADL transfers,  unable to perform fxl mobility on OT assessment d/t NWB to R LE And difficulty attempting "hop-step" with RW on L LE alone.  Pt would benefit from skilled OT to address noted impairments and functional limitations (see below for any additional details) in order to maximize safety and independence while minimizing falls risk and caregiver burden. Upon hospital discharge, recommend STR to maximize pt safety and return to PLOF.     Follow Up Recommendations  SNF    Equipment Recommendations  Other (comment) (defer to next venue of care)    Recommendations for Other Services       Precautions / Restrictions Precautions Precautions: Fall Required Braces or Orthoses: Knee Immobilizer - Right Knee Immobilizer - Right: On when out of bed or walking Restrictions Weight Bearing Restrictions: Yes RLE Weight Bearing: Non weight bearing      Mobility Bed Mobility Overal bed mobility: Modified Independent             General bed mobility comments: HOB elevated, extended time, use of bed rail  Transfers Overall transfer level: Needs assistance Equipment used: Rolling walker (2 wheeled) Transfers: Sit to/from [facility] Sit to Stand: Min assist Stand pivot transfers: Min assist;Mod assist       General transfer comment: Minimial difficulty maintaining NWB to R LE requiring cues. Difficulty pivoting on L LE d/t sock (reports using slip on shoes since Feb, has historically had troubles with non-skid socks)    Balance Overall balance assessment: Needs assistance Sitting-balance support: Feet supported Sitting balance-Leahy Scale: Good     Standing balance support: Bilateral upper extremity supported  Standing balance-Leahy Scale: Fair                             ADL either performed or assessed with clinical judgement   ADL Overall ADL's : Needs assistance/impaired                                       General ADL Comments: Setup and  MIN verbal cues for seated UB ADLs (pt's reported baseline d/t low vision). MAX A for LB ADLs including dressing at bed level using cross leg technique. Unable to don clothing to R LE d/t KI and requires MIN A on more functional L LE.     Vision Baseline Vision/History: Wears glasses Wears Glasses: At all times Patient Visual Report: No change from baseline Additional Comments: reports low vision at baseline requiring some assistance with things like tray setup for meals and uses visual scanning tehcniques or tactile scanning to locate some food items (appears "fuzzy" can see shadows/light and dark).     Perception     Praxis      Pertinent Vitals/Pain Pain Assessment: 0-10 Pain Score: 3  Pain Location: B knees (R knee with position change from sup to sit, L knee with WB), states 5/10 headache pain above R eye. Pain Descriptors / Indicators: Aching;Sore Pain Intervention(s): Limited activity within patient's tolerance;Monitored during session;Patient requesting pain meds-RN notified     Hand Dominance     Extremity/Trunk Assessment Upper Extremity Assessment Upper Extremity Assessment: Generalized weakness (H/o UE arthritis. Shld flexion ~3/4 arc of motion, elbow ROM WFL/MMT grossly 4-/5, wrist ROM ~3/4 arc/MMT grossly 3+/5, grip 4-/5)   Lower Extremity Assessment Lower Extremity Assessment: Defer to PT evaluation;RLE deficits/detail RLE: Unable to fully assess due to pain;Unable to fully assess due to immobilization       Communication Communication Communication: No difficulties   Cognition Arousal/Alertness: Awake/alert Behavior During Therapy: WFL for tasks assessed/performed Overall Cognitive Status: Within Functional Limits for tasks assessed                                 General Comments: Pt with some delayed responses in which daughter assists, but is overall apprpriate and oriented. Able to demo carryover.   General Comments       Exercises Other  Exercises Other Exercises: OT facilitates education re: use of AE for LB dressing. Pt is somewhat familiar and has some LB AE, but hasn't used for extended time and reports needing re-ed. Other Exercises: OT facilitates education re: NWB to R LE with pt and daughter with good reception but wondering why WB status downgraded. OT notifies ortho physician.   Shoulder Instructions      Home Living Family/patient expects to be discharged to:: Skilled nursing facility                                        Prior Functioning/Environment Level of Independence: Needs assistance  Gait / Transfers Assistance Needed: Pt reports using 2WW for fxl mobility occasionally (less recently d/t pain), reports she pivots herself on/off BSC For longer facility distances, reports use of w/c and staff assist with propulsion. ADL's / Homemaking Assistance Needed: States she can do seated  UB ADLs with setup/cues (low vision at baseline). States that PCAs take her to shower 2x/wk, but typically assist pt with bed bath at this time. If pt to shower, they use rolling shower chair. States she has AE for LB Dressing at home and is somewhat familiar with use, but so far, has not used at facility.            OT Problem List: Decreased strength;Decreased range of motion;Decreased activity tolerance;Impaired balance (sitting and/or standing);Decreased knowledge of use of DME or AE;Decreased knowledge of precautions;Impaired UE functional use;Pain      OT Treatment/Interventions: Self-care/ADL training;Therapeutic exercise;Energy conservation;DME and/or AE instruction;Therapeutic activities;Patient/family education;Balance training    OT Goals(Current goals can be found in the care plan section) Acute Rehab OT Goals Patient Stated Goal: to get stronger and eventually be able to put more weight on my right leg again OT Goal Formulation: With patient/family Time For Goal Achievement: 08/18/19 Potential to  Achieve Goals: Good  OT Frequency: Min 2X/week   Barriers to D/C:            Co-evaluation              AM-PAC OT "6 Clicks" Daily Activity     Outcome Measure Help from another person eating meals?: A Little Help from another person taking care of personal grooming?: A Little Help from another person toileting, which includes using toliet, bedpan, or urinal?: A Lot Help from another person bathing (including washing, rinsing, drying)?: A Lot Help from another person to put on and taking off regular upper body clothing?: A Little Help from another person to put on and taking off regular lower body clothing?: A Lot 6 Click Score: 15   End of Session Equipment Utilized During Treatment: Gait belt;Rolling walker Nurse Communication: Mobility status;Patient requests pain meds;Other (comment) (notified MD re: pt and family Q's r/t WB)  Activity Tolerance:   Patient left: in chair;with call bell/phone within reach;with chair alarm set;with family/visitor present  OT Visit Diagnosis: Unsteadiness on feet (R26.81);Muscle weakness (generalized) (M62.81);Pain Pain - Right/Left: Right Pain - part of body: Knee                Time: 1443-1540 OT Time Calculation (min): 55 min Charges:  OT General Charges $OT Visit: 1 Visit OT Evaluation $OT Eval Moderate Complexity: 1 Mod OT Treatments $Self Care/Home Management : 23-37 mins $Therapeutic Activity: 8-22 mins  Gerrianne Scale, MS, OTR/L ascom [phone] 08/04/19, 10:42 AM

## 2019-08-04 NOTE — Progress Notes (Signed)
   08/04/19 0351  ReDS Vest / Clip  BMI (Calculated) 30.7  Anatomical Comments NA due to height

## 2019-08-05 DIAGNOSIS — G600 Hereditary motor and sensory neuropathy: Secondary | ICD-10-CM | POA: Diagnosis not present

## 2019-08-05 DIAGNOSIS — M6281 Muscle weakness (generalized): Secondary | ICD-10-CM | POA: Diagnosis not present

## 2019-08-07 DIAGNOSIS — G600 Hereditary motor and sensory neuropathy: Secondary | ICD-10-CM | POA: Diagnosis not present

## 2019-08-07 DIAGNOSIS — M6281 Muscle weakness (generalized): Secondary | ICD-10-CM | POA: Diagnosis not present

## 2019-08-08 DIAGNOSIS — Z8673 Personal history of transient ischemic attack (TIA), and cerebral infarction without residual deficits: Secondary | ICD-10-CM | POA: Diagnosis not present

## 2019-08-08 DIAGNOSIS — Z7984 Long term (current) use of oral hypoglycemic drugs: Secondary | ICD-10-CM | POA: Diagnosis not present

## 2019-08-08 DIAGNOSIS — Z96652 Presence of left artificial knee joint: Secondary | ICD-10-CM | POA: Diagnosis not present

## 2019-08-08 DIAGNOSIS — M7051 Other bursitis of knee, right knee: Secondary | ICD-10-CM | POA: Diagnosis not present

## 2019-08-08 DIAGNOSIS — S72491D Other fracture of lower end of right femur, subsequent encounter for closed fracture with routine healing: Secondary | ICD-10-CM | POA: Diagnosis not present

## 2019-08-08 DIAGNOSIS — M9711XA Periprosthetic fracture around internal prosthetic right knee joint, initial encounter: Secondary | ICD-10-CM | POA: Diagnosis not present

## 2019-08-08 DIAGNOSIS — I110 Hypertensive heart disease with heart failure: Secondary | ICD-10-CM | POA: Diagnosis not present

## 2019-08-08 DIAGNOSIS — Z86718 Personal history of other venous thrombosis and embolism: Secondary | ICD-10-CM | POA: Diagnosis not present

## 2019-08-08 DIAGNOSIS — E785 Hyperlipidemia, unspecified: Secondary | ICD-10-CM | POA: Diagnosis not present

## 2019-08-08 DIAGNOSIS — I509 Heart failure, unspecified: Secondary | ICD-10-CM | POA: Diagnosis not present

## 2019-08-08 DIAGNOSIS — Z7722 Contact with and (suspected) exposure to environmental tobacco smoke (acute) (chronic): Secondary | ICD-10-CM | POA: Diagnosis not present

## 2019-08-08 DIAGNOSIS — E119 Type 2 diabetes mellitus without complications: Secondary | ICD-10-CM | POA: Diagnosis not present

## 2019-08-08 DIAGNOSIS — W1839XD Other fall on same level, subsequent encounter: Secondary | ICD-10-CM | POA: Diagnosis not present

## 2019-08-08 DIAGNOSIS — M25462 Effusion, left knee: Secondary | ICD-10-CM | POA: Diagnosis not present

## 2019-08-09 ENCOUNTER — Telehealth: Payer: Self-pay | Admitting: Family

## 2019-08-09 ENCOUNTER — Other Ambulatory Visit: Payer: Self-pay | Admitting: Family Medicine

## 2019-08-09 DIAGNOSIS — G4733 Obstructive sleep apnea (adult) (pediatric): Secondary | ICD-10-CM | POA: Diagnosis not present

## 2019-08-09 DIAGNOSIS — R5381 Other malaise: Secondary | ICD-10-CM | POA: Diagnosis not present

## 2019-08-09 DIAGNOSIS — S72401K Unspecified fracture of lower end of right femur, subsequent encounter for closed fracture with nonunion: Secondary | ICD-10-CM | POA: Diagnosis not present

## 2019-08-09 DIAGNOSIS — G600 Hereditary motor and sensory neuropathy: Secondary | ICD-10-CM | POA: Diagnosis not present

## 2019-08-09 DIAGNOSIS — M6281 Muscle weakness (generalized): Secondary | ICD-10-CM | POA: Diagnosis not present

## 2019-08-09 DIAGNOSIS — I2510 Atherosclerotic heart disease of native coronary artery without angina pectoris: Secondary | ICD-10-CM | POA: Diagnosis not present

## 2019-08-09 NOTE — Telephone Encounter (Signed)
Unable to reach patient regarding her new patient CHF Clinic appointment we had scheduled for 8/3.    Natayla Cadenhead, NT

## 2019-08-10 DIAGNOSIS — M6281 Muscle weakness (generalized): Secondary | ICD-10-CM | POA: Diagnosis not present

## 2019-08-10 DIAGNOSIS — G600 Hereditary motor and sensory neuropathy: Secondary | ICD-10-CM | POA: Diagnosis not present

## 2019-08-11 DIAGNOSIS — I639 Cerebral infarction, unspecified: Secondary | ICD-10-CM | POA: Diagnosis not present

## 2019-08-11 DIAGNOSIS — I5021 Acute systolic (congestive) heart failure: Secondary | ICD-10-CM | POA: Diagnosis not present

## 2019-08-11 DIAGNOSIS — D649 Anemia, unspecified: Secondary | ICD-10-CM | POA: Diagnosis not present

## 2019-08-11 DIAGNOSIS — E119 Type 2 diabetes mellitus without complications: Secondary | ICD-10-CM | POA: Diagnosis not present

## 2019-08-12 DIAGNOSIS — M6281 Muscle weakness (generalized): Secondary | ICD-10-CM | POA: Diagnosis not present

## 2019-08-12 DIAGNOSIS — G600 Hereditary motor and sensory neuropathy: Secondary | ICD-10-CM | POA: Diagnosis not present

## 2019-08-15 DIAGNOSIS — G600 Hereditary motor and sensory neuropathy: Secondary | ICD-10-CM | POA: Diagnosis not present

## 2019-08-15 DIAGNOSIS — M6281 Muscle weakness (generalized): Secondary | ICD-10-CM | POA: Diagnosis not present

## 2019-08-16 DIAGNOSIS — G600 Hereditary motor and sensory neuropathy: Secondary | ICD-10-CM | POA: Diagnosis not present

## 2019-08-16 DIAGNOSIS — M6281 Muscle weakness (generalized): Secondary | ICD-10-CM | POA: Diagnosis not present

## 2019-08-17 DIAGNOSIS — G600 Hereditary motor and sensory neuropathy: Secondary | ICD-10-CM | POA: Diagnosis not present

## 2019-08-17 DIAGNOSIS — M6281 Muscle weakness (generalized): Secondary | ICD-10-CM | POA: Diagnosis not present

## 2019-08-18 DIAGNOSIS — M6281 Muscle weakness (generalized): Secondary | ICD-10-CM | POA: Diagnosis not present

## 2019-08-18 DIAGNOSIS — G600 Hereditary motor and sensory neuropathy: Secondary | ICD-10-CM | POA: Diagnosis not present

## 2019-08-19 DIAGNOSIS — M6281 Muscle weakness (generalized): Secondary | ICD-10-CM | POA: Diagnosis not present

## 2019-08-19 DIAGNOSIS — G600 Hereditary motor and sensory neuropathy: Secondary | ICD-10-CM | POA: Diagnosis not present

## 2019-08-22 DIAGNOSIS — G600 Hereditary motor and sensory neuropathy: Secondary | ICD-10-CM | POA: Diagnosis not present

## 2019-08-22 DIAGNOSIS — M6281 Muscle weakness (generalized): Secondary | ICD-10-CM | POA: Diagnosis not present

## 2019-08-23 DIAGNOSIS — M6281 Muscle weakness (generalized): Secondary | ICD-10-CM | POA: Diagnosis not present

## 2019-08-23 DIAGNOSIS — G600 Hereditary motor and sensory neuropathy: Secondary | ICD-10-CM | POA: Diagnosis not present

## 2019-08-24 DIAGNOSIS — G600 Hereditary motor and sensory neuropathy: Secondary | ICD-10-CM | POA: Diagnosis not present

## 2019-08-24 DIAGNOSIS — M6281 Muscle weakness (generalized): Secondary | ICD-10-CM | POA: Diagnosis not present

## 2019-08-25 DIAGNOSIS — G600 Hereditary motor and sensory neuropathy: Secondary | ICD-10-CM | POA: Diagnosis not present

## 2019-08-25 DIAGNOSIS — M6281 Muscle weakness (generalized): Secondary | ICD-10-CM | POA: Diagnosis not present

## 2019-08-26 ENCOUNTER — Ambulatory Visit (INDEPENDENT_AMBULATORY_CARE_PROVIDER_SITE_OTHER): Payer: Medicare Other | Admitting: Cardiovascular Disease

## 2019-08-26 ENCOUNTER — Encounter: Payer: Self-pay | Admitting: Cardiovascular Disease

## 2019-08-26 ENCOUNTER — Other Ambulatory Visit: Payer: Self-pay

## 2019-08-26 VITALS — BP 100/60 | HR 70 | Ht <= 58 in | Wt 149.0 lb

## 2019-08-26 DIAGNOSIS — E785 Hyperlipidemia, unspecified: Secondary | ICD-10-CM | POA: Diagnosis not present

## 2019-08-26 DIAGNOSIS — I5023 Acute on chronic systolic (congestive) heart failure: Secondary | ICD-10-CM | POA: Diagnosis not present

## 2019-08-26 DIAGNOSIS — E1169 Type 2 diabetes mellitus with other specified complication: Secondary | ICD-10-CM | POA: Diagnosis not present

## 2019-08-26 DIAGNOSIS — M6281 Muscle weakness (generalized): Secondary | ICD-10-CM | POA: Diagnosis not present

## 2019-08-26 DIAGNOSIS — R197 Diarrhea, unspecified: Secondary | ICD-10-CM | POA: Diagnosis not present

## 2019-08-26 DIAGNOSIS — E1159 Type 2 diabetes mellitus with other circulatory complications: Secondary | ICD-10-CM

## 2019-08-26 DIAGNOSIS — G600 Hereditary motor and sensory neuropathy: Secondary | ICD-10-CM | POA: Diagnosis not present

## 2019-08-26 DIAGNOSIS — I25118 Atherosclerotic heart disease of native coronary artery with other forms of angina pectoris: Secondary | ICD-10-CM | POA: Diagnosis not present

## 2019-08-26 MED ORDER — ROSUVASTATIN CALCIUM 10 MG PO TABS: ORAL_TABLET | ORAL | 3 refills | Status: DC

## 2019-08-26 MED ORDER — ISOSORBIDE MONONITRATE ER 30 MG PO TB24
30.0000 mg | ORAL_TABLET | Freq: Every day | ORAL | 3 refills | Status: DC
Start: 2019-08-26 — End: 2019-11-04

## 2019-08-26 NOTE — Patient Instructions (Addendum)
Medication Instructions:  1. Please restart crestor 10 mg daily  2. Please decrease the imdur/isosorbide down to 30 mg daily  3.  Please change MiraLAX to daily as needed only for constipation (17 g packet)  4.  Continue Senokot as written, hold that day for diarrhea    If you need a refill on your cardiac medications before your next appointment, please call your pharmacy.    Lab work: No new labs needed   If you have labs (blood work) drawn today and your tests are completely normal, you will receive your results only by: Marland Kitchen MyChart Message (if you have MyChart) OR . A paper copy in the mail If you have any lab test that is abnormal or we need to change your treatment, we will call you to review the results.   Testing/Procedures: No new testing needed   Follow-Up: At [facility], you and your health needs are our priority.  As part of our continuing mission to provide you with exceptional heart care, we have created designated Provider Care Teams.  These Care Teams include your primary Cardiologist (physician) and Advanced Practice Providers (APPs -  Physician Assistants and Nurse Practitioners) who all work together to provide you with the care you need, when you need it.  . You will need a follow up appointment in 3 months   . Providers on your designated Care Team:   . Murray Hodgkins, NP . Christell Faith, PA-C . Marrianne Mood, PA-C  Any Other Special Instructions Will Be Listed Below (If Applicable).  COVID-19 Vaccine Information can be found at: [email or web address].uk For questions related to vaccine distribution or appointments, please email [email or web address] or call [phone].

## 2019-08-26 NOTE — Progress Notes (Signed)
Cardiology Office Note  Date:  08/26/2019   ID:  ROSIELEE CORPORAN, DOB 1931-01-27, MRN [id number]  PCP:  Abner Greenspan, MD   Chief Complaint  Patient presents with  . New Patient (Initial Visit)    Follow up [facility]; CHF. Meds reviewed by the pt. verbally. Pt. c/o chest pain last night and has noticed pain in chest from lying to sitting that radiates through to her back and frequent diarrhea.     HPI:  86 y.o. female with history of CAD  Three-vessel coronary disease on  catheterization May 2021,  stent placed to LAD and left circumflex moderate AS on echocardiogram Right femur fracture, February 2021 slow recovery hypertension,  HFpEF  Recent hospital mission for CHF July 2021  Type 2 diabetes Presenting to establish in the [facility] office, follow-up of her acute on chronic diastolic CHF  Prior studies reviewed PCI (06/17/2019, [facility]): Successful PCI of the ostial LAD using 3.0 x 16 mm drug-eluting stent. Successful rotational atherectomy and PCI to ostial LCx using 3.0 x 6mm drug-eluting stent.  LHC (06/15/2019, [facility]): Severe three-vessel coronary artery disease  Details as above  Echocardiogram (06/15/2019, [facility]):  LVEF greater than 55% with mild LVH. Normal RV contraction. Mild MR. Moderate aortic stenosis. Mild to moderate TR  Hospital records reviewed in detail with her In the hospital July 2021 for shortness of breath, CHF symptoms  was treated with IV Lasix Good diuresis, discharged on Lasix 40 milligrams daily with extra 40 milligrams for 3 pound weight gain  Discharged to [facility] for further physical therapy Still recovering from right femur fracture,  Participating in PT  No medication list provided from [place] on today's visit, several phone calls to verify her medications  Cardiac medications  include aspirin 81 daily Plavix 75 daily Lasix 20 daily (was discharged on 40 mg daily from the hospital) Extra Lasix for 3 pound weight  gain Isosorbide 60 daily Lisinopril 10 daily Metoprolol succinate 25 daily Crestor 10 daily (appears Crestor has been held)  Right knee pain, just started participating in physical therapy, standing, significant discomfort That mattress, having chronic back pain radiating through to the front Reports having worsening diarrhea, waxing waning constipation, (more diarrhea)  We verified her medications, she is receiving Senokot, MiraLAX daily  Denies any leg swelling, shortness of breath No PND orthopnea  EKG personally reviewed by myself on todays visit Shows normal sinus rhythm rate 70 bpm no significant ST-T wave changes   PMH:   has a past medical history of Anemia, Asthma, Charcot-Marie-Tooth disease, Diverticulosis, DVT (deep venous thrombosis) ([place]) (2003), GERD (gastroesophageal reflux disease), Grief reaction, Headache(784.0), Hiatal hernia, Hyperlipidemia, Hypertension, MI (myocardial infarction) ([place]), OA (ocular albinism) ([place]), Osteopenia, PUD (peptic ulcer disease), Shingles, Stroke ([place]) (8/10), and Urine incontinence.  PSH:    Past Surgical History:  Procedure Laterality Date  . carotid dopplers  8/10  . COLONOSCOPY  2002  . HERNIA REPAIR  2002   bleeding  . HIATAL HERNIA REPAIR  2000   needed blood transfusion  . Stroke  8/10   hosp stroke/ posterior (vs posterior reversible encephalopathy syndrome) - d/c on Plavix  . TEE normal  2/56   no emoblic source  . TOTAL KNEE ARTHROPLASTY  03,06   Bilateral  . VESICOVAGINAL FISTULA CLOSURE W/ TAH  1964   with bleeding and cervical dysplasia    Current Outpatient Medications  Medication Sig Dispense Refill  . acetaminophen (TYLENOL) 500 MG tablet Take 1,000 mg by mouth every  8 (eight) hours as needed.    Marland Kitchen alum & mag hydroxide-simeth (MAALOX/MYLANTA) 200-200-20 MG/5ML suspension Take 30 mLs by mouth in the morning, at noon, and at bedtime.    Marland Kitchen aspirin EC 81 MG EC tablet Take 1 tablet (81 mg total) by mouth daily.  Swallow whole. 30 tablet 11  . azelastine (ASTELIN) 0.1 % nasal spray Place 1 spray into both nostrils 2 (two) times daily. Use in each nostril as directed 30 mL 0  . Blood Glucose Monitoring Suppl (FREESTYLE LITE) DEVI Use to check blood glucose daily and as needed for diabetes 1 each 0  . calcium-vitamin D (OSCAL WITH D) 500-200 MG-UNIT tablet Take 1 tablet by mouth daily with breakfast.    . Cholecalciferol (VITAMIN D) 2000 UNITS CAPS Take 2 capsules by mouth daily.      . clopidogrel (PLAVIX) 75 MG tablet TAKE 1 TABLET BY MOUTH EVERY DAY 90 tablet 3  . cyanocobalamin 1000 MCG tablet Take 1,000 mcg by mouth daily.    Marland Kitchen esomeprazole (NEXIUM) 40 MG capsule Take 1 capsule (40 mg total) by mouth daily before breakfast. 90 capsule 3  . famotidine (PEPCID) 20 MG tablet Take 20 mg by mouth at bedtime.    . ferrous sulfate 325 (65 FE) MG tablet Take 325 mg by mouth daily with breakfast.    . Flaxseed, Linseed, (FLAX SEED OIL) 1000 MG CAPS Take 1 capsule by mouth daily.      Marland Kitchen FLUoxetine (PROZAC) 20 MG capsule Take 1 capsule (20 mg total) by mouth daily. 90 capsule 3  . furosemide (LASIX) 40 MG tablet Take 1 tablet (40 mg total) by mouth daily. (Patient taking differently: Take 20 mg by mouth daily. )    . glucose blood (FREESTYLE LITE) test strip Use to check blood sugar once daily for DM (dx. E11.618) 100 each 0  . Lancets (FREESTYLE) lancets Use as instructedUse to check blood sugar once daily for DM (dx. E11.618) 100 each 0  . lidocaine (LMX) 4 % cream Apply 1 application topically as needed.    Marland Kitchen lisinopril (ZESTRIL) 10 MG tablet Take 1 tablet (10 mg total) by mouth daily.    Marland Kitchen loperamide (IMODIUM) 2 MG capsule Take 2 mg by mouth as needed for diarrhea or loose stools.    Marland Kitchen loratadine (CLARITIN) 10 MG tablet TAKE 1 TABLET BY MOUTH EVERY DAY 90 tablet 1  . melatonin 3 MG TABS tablet Take 3 mg by mouth at bedtime.    . metFORMIN (GLUCOPHAGE) 1000 MG tablet Take 1 tablet (1,000 mg total) by mouth 2  (two) times daily with a meal. 180 tablet 3  . metoprolol succinate (TOPROL-XL) 25 MG 24 hr tablet Take 1 tablet (25 mg total) by mouth daily.    . Multiple Vitamins-Minerals (PRESERVISION AREDS 2+MULTI VIT PO) Take 1 tablet by mouth at bedtime.    . nitroGLYCERIN (NITROSTAT) 0.4 MG SL tablet Place under the tongue.    Marland Kitchen omega-3 acid ethyl esters (LOVAZA) 1 g capsule TAKE 1 CAPSULE (1 G TOTAL) BY MOUTH DAILY. 90 capsule 3  . ondansetron (ZOFRAN) 4 MG tablet Take 4 mg by mouth every 4 (four) hours as needed for nausea or vomiting.    . polyethylene glycol (MIRALAX / GLYCOLAX) 17 g packet Take 17 g by mouth daily as needed.    Marland Kitchen PROAIR HFA 108 (90 Base) MCG/ACT inhaler INHALE 2 PUFFS INTO THE LUNGS EVERY 4 (FOUR) HOURS AS NEEDED FOR WHEEZING OR SHORTNESS OF BREATH. 8.5  g 3  . sennosides-docusate sodium (SENOKOT-S) 8.6-50 MG tablet Take 2 tablets by mouth daily.    . traMADol (ULTRAM) 50 MG tablet Take 1 tablet (50 mg total) by mouth every 6 (six) hours as needed for moderate pain. 30 tablet 0  . isosorbide mononitrate (IMDUR) 30 MG 24 hr tablet Take 1 tablet (30 mg total) by mouth daily. 90 tablet 3  . Misc. Devices (WALKER) MISC Wheeled walker with seat- Pt must use for ambulation-- for use as directed: ambulation dx: 729.5, 780.79, 786.05, 784.0, 715.89.  (Patient not taking: Reported on 07/31/2019)    . rosuvastatin (CRESTOR) 10 MG tablet TAKE 1 TABLET BY MOUTH EVERYDAY AT BEDTIME 90 tablet 3   No current facility-administered medications for this visit.     Allergies:   Morphine and related, Other, Shrimp [shellfish allergy], Amlodipine besylate, Atorvastatin, Ciprofloxacin, Influenza vaccines, Nitrofurantoin, Omeprazole, Pantoprazole sodium, Penicillin g, Penicillins, Simvastatin, Sulfonamide derivatives, Tramadol, Codeine, and Prednisone   Social History:  The patient  reports that she has never smoked. She has never used smokeless tobacco. She reports that she does not drink alcohol and does  not use drugs.   Family History:   family history includes Alcohol abuse in her brother and father; Cerebral aneurysm in her father and son; Cervical cancer in her sister; Diabetes in her brother and sister; Hyperlipidemia in her brother and father; Other in her sister and another family member; Ovarian cancer in her sister; Sarcoidosis in her sister; Stroke in her brother and mother.    Review of Systems: Review of Systems  Constitutional: Negative.   HENT: Negative.   Respiratory: Negative.   Cardiovascular: Negative.   Gastrointestinal: Positive for diarrhea.  Musculoskeletal: Positive for back pain.  Neurological: Negative.   Psychiatric/Behavioral: Negative.   All other systems reviewed and are negative.   PHYSICAL EXAM: VS:  BP (!) 100/60 (BP Location: Right Arm, Patient Position: Sitting, Cuff Size: Normal)   Pulse 70   Ht 4\' 10"  (1.473 m)   Wt 149 lb (67.6 kg)   SpO2 94%   BMI 31.14 kg/m  , BMI Body mass index is 31.14 kg/m. GEN: Well nourished, well developed, in no acute distress, presents in a wheelchair HEENT: normal Neck: no JVD, carotid bruits, or masses Cardiac: RRR; no murmurs, rubs, or gallops,no edema  Respiratory:  clear to auscultation bilaterally, normal work of breathing GI: soft, nontender, nondistended, + BS MS: no deformity or atrophy Skin: warm and dry, no rash Neuro:  Strength and sensation are intact Psych: euthymic mood, full affect   Recent Labs: 07/31/2019: ALT 15; B Natriuretic Peptide 502.0 08/02/2019: Hemoglobin 11.3; Platelets 186 08/04/2019: BUN 28; Creatinine, Ser 0.85; Potassium 4.2; Sodium 131    Lipid Panel Lab Results  Component Value Date   CHOL 156 07/27/2018   HDL 60.50 07/27/2018   LDLCALC 75 07/27/2018   TRIG 103.0 07/27/2018      Wt Readings from Last 3 Encounters:  08/26/19 149 lb (67.6 kg)  08/04/19 154 lb 8 oz (70.1 kg)  03/14/19 160 lb (72.6 kg)       ASSESSMENT AND PLAN:  Problem List Items Addressed This  Visit      Cardiology Problems   Acute on chronic congestive heart failure (HCC) - Primary   Relevant Medications   nitroGLYCERIN (NITROSTAT) 0.4 MG SL tablet   rosuvastatin (CRESTOR) 10 MG tablet   isosorbide mononitrate (IMDUR) 30 MG 24 hr tablet   Other Relevant Orders   EKG 12-Lead  CAD (coronary artery disease)   Relevant Medications   nitroGLYCERIN (NITROSTAT) 0.4 MG SL tablet   rosuvastatin (CRESTOR) 10 MG tablet   isosorbide mononitrate (IMDUR) 30 MG 24 hr tablet     Other   Diabetes mellitus (HCC)   Relevant Medications   rosuvastatin (CRESTOR) 10 MG tablet   Hyperlipidemia associated with type 2 diabetes mellitus (HCC)   Relevant Medications   rosuvastatin (CRESTOR) 10 MG tablet    Other Visit Diagnoses    Diarrhea, unspecified type         Diastolic CHF Appears euvolemic on today's visit Significant diarrhea since she has been in the rehab Tolerating Lasix 20 daily (dose was decreased from 40 daily at the time of discharge presumably by physicians at the rehab) Weight is stable per family who presents with her today  Essential hypertension Blood pressure low on today's visit, systolic pressures in the office today 100 range up to 108 Denies orthostasis symptoms Blood pressure possibly from diarrhea Recommend we decrease Imdur down to 30 mg daily Daughter who presents with her today will help monitor blood pressure at rehab  Cad with stable angina Full list of medications was not obtained from rehab Continue aspirin, Plavix, statin, beta-blocker  Aortic valve stenosis Moderate stenosis on prior echocardiogram, annual echocardiogram suggested    Total encounter time more than 25 minutes  Greater than 50% was spent in counseling and coordination of care with the patient    Signed, Esmond Plants, M.D., Ph.D. [place], [place]

## 2019-08-29 ENCOUNTER — Ambulatory Visit: Payer: Medicare Other | Admitting: Family

## 2019-08-29 DIAGNOSIS — M6281 Muscle weakness (generalized): Secondary | ICD-10-CM | POA: Diagnosis not present

## 2019-08-29 DIAGNOSIS — G600 Hereditary motor and sensory neuropathy: Secondary | ICD-10-CM | POA: Diagnosis not present

## 2019-08-30 DIAGNOSIS — M6281 Muscle weakness (generalized): Secondary | ICD-10-CM | POA: Diagnosis not present

## 2019-08-30 DIAGNOSIS — G600 Hereditary motor and sensory neuropathy: Secondary | ICD-10-CM | POA: Diagnosis not present

## 2019-08-31 DIAGNOSIS — G600 Hereditary motor and sensory neuropathy: Secondary | ICD-10-CM | POA: Diagnosis not present

## 2019-08-31 DIAGNOSIS — M6281 Muscle weakness (generalized): Secondary | ICD-10-CM | POA: Diagnosis not present

## 2019-09-01 DIAGNOSIS — M6281 Muscle weakness (generalized): Secondary | ICD-10-CM | POA: Diagnosis not present

## 2019-09-01 DIAGNOSIS — G600 Hereditary motor and sensory neuropathy: Secondary | ICD-10-CM | POA: Diagnosis not present

## 2019-09-15 DIAGNOSIS — N390 Urinary tract infection, site not specified: Secondary | ICD-10-CM | POA: Diagnosis not present

## 2019-09-21 ENCOUNTER — Other Ambulatory Visit: Payer: Self-pay

## 2019-09-21 ENCOUNTER — Non-Acute Institutional Stay: Payer: Medicare Other | Admitting: Nurse Practitioner

## 2019-09-21 DIAGNOSIS — Z515 Encounter for palliative care: Secondary | ICD-10-CM | POA: Diagnosis not present

## 2019-09-21 DIAGNOSIS — I5033 Acute on chronic diastolic (congestive) heart failure: Secondary | ICD-10-CM

## 2019-09-21 NOTE — Progress Notes (Signed)
[facility] Consult Note Telephone: [phone]  Fax: [phone]  PATIENT NAME: Lydia King [address] [place] [address] [phone] (home)  DOB: Mar 01, 1930 MRN: [id number]  PRIMARY CARE PROVIDER:    Abner Greenspan, MD,  [place] [address] [phone]  REFERRING PROVIDER:  Dr [facility]  Primary Care provider: Dr. Nyra King  RESPONSIBLE PARTY:   Extended Emergency Contact Information Primary Emergency Contact: Lydia King Address: [address]          [place], [place] Phone: [phone] Mobile Phone: [phone] Relation: Daughter  I met face to face patient in [facility] facility.  ASSESSMENT AND RECOMMENDATIONS:   1. Advance Care Planning/Goals of Care: Goals include to maximize quality of life and symptom management. Our advance care planning conversation included a discussion about:     The value and importance of advance care planning   Exploration of personal, cultural or spiritual beliefs that might influence medical decisions   Exploration of goals of care in the event of a sudden injury or illness   Identification and preparation of a healthcare agent   Review and updating or creation of an  advance directive document. Discussed what palliative care services are and how Palliative care service can assist in the  management of her symptoms at any stage of her chronic medical conditions. Patient has a DNR form on file in the facility, copy made and uploaded to [facility].   2. Symptom Management:  CHF: Patient report having 7 ED visits with 2 hospital admissions in the last 7 months. Most of patient ED visit has been related to HF exacerbations. Last hospitalization for CHF is July 2021, EF 55% at the time. Patient voiced no concerns today, no evidence of acute distress observed. Chart review today revealed stable  weights. Patient will however benefit from daily weights. Patient educated on the s/s fluid overload which can lead to HF exacerbation; symptoms like increased edema, increased weight, or orthopnea. Patient verbalized understanding.   Right Femur fracture: Sustained right fracture from a fall in Feb 2021. Patient report pain in the right leg especially during ambulation or any activity. She is currently on Tramadol every 6hrs as needed for pain, denied pain at this time. Daughter expressed concern about patient function, daughter believed  patient function is not improving and would want patient to continue physical therapy. Will review need for PT with Lydia King.   3. Follow up Palliative Care Visit: Palliative care will continue to follow for goals of care clarification and symptom management. Return 4 weeks or prn.  4. Family /Caregiver/Community Supports: Daughter Lydia King who is patient's POA is very involved in her care. Patient has 2 other children involved in her care.  5. Cognitive / Functional decline: Patient is alert and oriented x 4. Continent of bowel and bladder, requires assistance with her ADls.  Patient requires stand assist with walker to ambulate to bathroom, and wheel chair to ambulate within the facility. 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  I spent 80 minutes providing this consultation, from 2:42pm to 5:02pm. More than 50% of the time in this consultation was spent coordinating communication.   CHIEF COMPLAINT: Initial visit for Palliative care consultation.  HISTORY OF PRESENT ILLNESS:  Lydia King is a 86 y.o. year old female with multiple medical problems including acute on chronic HF (EF 55%  in July 2021), CAD with 2 stents placed, OSA with CPAP use, Asthma, DM2, Osteopenia, Osteoarthritis multiple joints and history of CVA with cortical blindness. Palliative Care was asked to follow this patient by consultation  request of Lydia King to help address advance care planning and goals of care. This is an initial visit.  CODE STATUS: DNR  PPS: 50%  HOSPICE ELIGIBILITY/DIAGNOSIS: TBD  PAST MEDICAL HISTORY:  Past Medical History:  Diagnosis Date   Anemia    in past (from blood loss at hiatal hernia)   Asthma    Charcot-Marie-Tooth disease    walks with cane (fairly controlled)   Diverticulosis    DVT (deep venous thrombosis) ([facility]) 2003   after sx   GERD (gastroesophageal reflux disease)    Grief reaction    on fluoxetine   Headache(784.0)    migraine   Hiatal hernia    Hyperlipidemia    Hypertension    MI (myocardial infarction) ([facility])    OA (ocular albinism) ([facility])    Osteopenia    PUD (peptic ulcer disease)    gastric, past   Shingles    Stroke ([facility]) 8/10   posterior reversible encephalopathy syndrome   Urine incontinence     SOCIAL HX:  Social History   Tobacco Use   Smoking status: Never Smoker   Smokeless tobacco: Never Used  Substance Use Topics   Alcohol use: No   FAMILY HX:  Family History  Problem Relation Age of Onset   Cerebral aneurysm Son    Cerebral aneurysm Father    Hyperlipidemia Father    Alcohol abuse Father    Stroke Mother    Cervical cancer Sister    Hyperlipidemia Brother    Alcohol abuse Brother    Stroke Brother    Diabetes Brother    Diabetes Sister    Sarcoidosis Sister    Other Sister        OP/ Broken hip   Other Other        whole family has charcot marie tooth   Ovarian cancer Sister     ALLERGIES:  Allergies  Allergen Reactions   Morphine And Related Anaphylaxis   Other Shortness Of Breath and Other (See Comments)   Shrimp [Shellfish Allergy] Anaphylaxis    Anaphylaxis-swelling of throat/sob     Amlodipine Besylate     REACTION: edema   Atorvastatin     REACTION: pain- could not walk   Ciprofloxacin Other (See Comments)    REACTION: unkown Trouble breathing   Influenza Vaccines  Other (See Comments)    Arm swelling, redness, chills, fever   Nitrofurantoin    Omeprazole     REACTION: not effective   Pantoprazole Sodium     REACTION: does not work well   Penicillin G Hives    ITCHY RASH   Penicillins     REACTION: itchy rash   Simvastatin     REACTION: leg pain   Sulfonamide Derivatives     REACTION: itchy rash   Tramadol Other (See Comments)    Took for years, but then told this was potential cause of prior strokes and she should not ever take again   Codeine Rash    REACTION: itchy rash ITCHY RASH, also breathing trouble   Prednisone Other (See Comments)    REACTION: RASH.// SHORTNESS OF BREATH Prior possible flushing reaction in face, but tolerated it in 2020 and 2021 up to 72m     PERTINENT MEDICATIONS:  Outpatient Encounter Medications as of  09/21/2019  Medication Sig   acetaminophen (TYLENOL) 500 MG tablet Take 1,000 mg by mouth every 8 (eight) hours as needed.   alum & mag hydroxide-simeth (MAALOX/MYLANTA) 200-200-20 MG/5ML suspension Take 30 mLs by mouth in the morning, at noon, and at bedtime.   aspirin EC 81 MG EC tablet Take 1 tablet (81 mg total) by mouth daily. Swallow whole.   azelastine (ASTELIN) 0.1 % nasal spray Place 1 spray into both nostrils 2 (two) times daily. Use in each nostril as directed   Blood Glucose Monitoring Suppl (FREESTYLE LITE) DEVI Use to check blood glucose daily and as needed for diabetes   calcium-vitamin D (OSCAL WITH D) 500-200 MG-UNIT tablet Take 1 tablet by mouth daily with breakfast.   Cholecalciferol (VITAMIN D) 2000 UNITS CAPS Take 2 capsules by mouth daily.     clopidogrel (PLAVIX) 75 MG tablet TAKE 1 TABLET BY MOUTH EVERY DAY   cyanocobalamin 1000 MCG tablet Take 1,000 mcg by mouth daily.   esomeprazole (NEXIUM) 40 MG capsule Take 1 capsule (40 mg total) by mouth daily before breakfast.   famotidine (PEPCID) 20 MG tablet Take 20 mg by mouth at bedtime.   ferrous sulfate 325 (65 FE) MG  tablet Take 325 mg by mouth daily with breakfast.   Flaxseed, Linseed, (FLAX SEED OIL) 1000 MG CAPS Take 1 capsule by mouth daily.     FLUoxetine (PROZAC) 20 MG capsule Take 1 capsule (20 mg total) by mouth daily.   furosemide (LASIX) 40 MG tablet Take 1 tablet (40 mg total) by mouth daily. (Patient taking differently: Take 20 mg by mouth daily. )   glucose blood (FREESTYLE LITE) test strip Use to check blood sugar once daily for DM (dx. E11.618)   isosorbide mononitrate (IMDUR) 30 MG 24 hr tablet Take 1 tablet (30 mg total) by mouth daily.   Lancets (FREESTYLE) lancets Use as instructedUse to check blood sugar once daily for DM (dx. E11.618)   lidocaine (LMX) 4 % cream Apply 1 application topically as needed.   lisinopril (ZESTRIL) 10 MG tablet Take 1 tablet (10 mg total) by mouth daily.   loperamide (IMODIUM) 2 MG capsule Take 2 mg by mouth as needed for diarrhea or loose stools.   loratadine (CLARITIN) 10 MG tablet TAKE 1 TABLET BY MOUTH EVERY DAY   melatonin 3 MG TABS tablet Take 3 mg by mouth at bedtime.   metFORMIN (GLUCOPHAGE) 1000 MG tablet Take 1 tablet (1,000 mg total) by mouth 2 (two) times daily with a meal.   metoprolol succinate (TOPROL-XL) 25 MG 24 hr tablet Take 1 tablet (25 mg total) by mouth daily.   Misc. Devices (WALKER) MISC Wheeled walker with seat- Pt must use for ambulation-- for use as directed: ambulation dx: 729.5, 780.79, 786.05, 784.0, 715.89.  (Patient not taking: Reported on 07/31/2019)   Multiple Vitamins-Minerals (PRESERVISION AREDS 2+MULTI VIT PO) Take 1 tablet by mouth at bedtime.   nitroGLYCERIN (NITROSTAT) 0.4 MG SL tablet Place under the tongue.   omega-3 acid ethyl esters (LOVAZA) 1 g capsule TAKE 1 CAPSULE (1 G TOTAL) BY MOUTH DAILY.   ondansetron (ZOFRAN) 4 MG tablet Take 4 mg by mouth every 4 (four) hours as needed for nausea or vomiting.   polyethylene glycol (MIRALAX / GLYCOLAX) 17 g packet Take 17 g by mouth daily as needed.   PROAIR  HFA 108 (90 Base) MCG/ACT inhaler INHALE 2 PUFFS INTO THE LUNGS EVERY 4 (FOUR) HOURS AS NEEDED FOR WHEEZING OR SHORTNESS OF BREATH.  rosuvastatin (CRESTOR) 10 MG tablet TAKE 1 TABLET BY MOUTH EVERYDAY AT BEDTIME   sennosides-docusate sodium (SENOKOT-S) 8.6-50 MG tablet Take 2 tablets by mouth daily.   traMADol (ULTRAM) 50 MG tablet Take 1 tablet (50 mg total) by mouth every 6 (six) hours as needed for moderate pain.   No facility-administered encounter medications on file as of 09/21/2019.    PHYSICAL EXAM / ROS:   Current and past weights: 153lbs in June 2021,  Most recent 154.5lbs, BMI 31.2 General: NAD, awake and alert, cooperative Cardiovascular: denied chest pain, no edema Pulmonary: No PND orthopnea, no cough, no SOB, normal breathing observed, on room air, oxygen saturation 96% Abdomen: appetite good, denied constipation, continent of bowel GU: denies dysuria, continent of urine MSK:  No joint deformity, Skin: no rashes or wounds reported or noted in exposed skin Neurological: mild lower extremity weakness, strong hand grips    Jari Favre, DNP, AGPCNP-BC

## 2019-09-25 NOTE — Telephone Encounter (Signed)
Last seen in clinic at the end of July She was tolerating Lasix 20 daily (dose was decreased from 40 daily at the time of discharge presumably by physicians at the rehab) I would go back to 40 mg daily Discharge instructions from the hospitalization before rehab indicated she was to be on 40 daily with extra 40 in the afternoon for 3 pound weight gain We may need to go back to that regiment When she was on 20 daily she was having some diarrhea maybe that has improved and now she has fluid retention and leg swelling

## 2019-09-27 DIAGNOSIS — I509 Heart failure, unspecified: Secondary | ICD-10-CM | POA: Diagnosis not present

## 2019-09-27 DIAGNOSIS — R5381 Other malaise: Secondary | ICD-10-CM | POA: Diagnosis not present

## 2019-09-28 ENCOUNTER — Non-Acute Institutional Stay: Payer: Medicare Other | Admitting: Nurse Practitioner

## 2019-09-28 ENCOUNTER — Other Ambulatory Visit: Payer: Self-pay

## 2019-09-28 ENCOUNTER — Encounter: Payer: Self-pay | Admitting: Nurse Practitioner

## 2019-09-28 VITALS — BP 130/60 | HR 68 | Temp 97.8°F | Wt 154.5 lb

## 2019-09-28 DIAGNOSIS — Z515 Encounter for palliative care: Secondary | ICD-10-CM

## 2019-09-28 DIAGNOSIS — I5033 Acute on chronic diastolic (congestive) heart failure: Secondary | ICD-10-CM | POA: Diagnosis not present

## 2019-09-28 NOTE — Progress Notes (Addendum)
[facility] Consult Note Telephone: [phone]  Fax: [phone]  PATIENT NAME: Lydia King DOB: [birth year] MRN: [id number]  PRIMARY CARE PROVIDER:  Dr Hodges/[facility]\  Responsible party: Thompson Caul daughter [phone] or [phone]  RECOMMENDATIONS and PLAN:  1. Advance Care Planning; DNR in [place]  2. Goals of Care: Goals include to maximize quality of life and symptom management. Our advance care planning conversation included a discussion about:     The value and importance of advance care planning   Exploration of personal, cultural or spiritual beliefs that might influence medical decisions   Exploration of goals of care in the event of a sudden injury or illness   Identification and preparation of a healthcare agent   Review and updating or creation of an  advance directive document.  3. Palliative care encounter; Palliative care encounter; Palliative medicine team will continue to support patient, patient's family, and medical team. Visit consisted of counseling and education dealing with the complex and emotionally intense issues of symptom management and palliative care in the setting of serious and potentially life-threatening illness  4. f/u 2 weeks for ongoing monitoring str, progression, planning for d/c plans or transition to LTC with family, goc  I spent 90 minutes providing this consultation,  Start at 10:45am. More than 50% of the time in this consultation was spent coordinating communication.   HISTORY OF PRESENT ILLNESS:  Lydia King is a 86 y.o. year old female with multiple medical problems including  acute on chronic HF (EF 55% in July 2021), CAD with 2 stents placed, OSA with CPAP use, Asthma, DM2, Osteopenia, Osteoarthritis multiple joints and history of CVA with cortical blindness. Lydia King continues to reside at [facility]. Lydia King does walk with a walker and assistance. Lydia King does require assistance with bathing, dressing, transferring. Lydia King is able to sit up in a wheelchair. Lydia King does require assistance with tray setup but is able to feed herself. Appetite is slowly improving for staff. 8 / 31 / 2021 care plan meeting held with [facility] no answer and no way to leave a voicemail. Staff endorses no new changes our concerns. Lydia King continues to work with therapy. At present Lydia King is lying in bed. Lydia King does appear comfortable, reading her book. No visitors present. I visited and observed Lydia King. We talked about purpose of palliative care visit. Lydia King smiled and said thank you for visiting me. We talked about how she was feeling today. Lydia King endorses that she is doing okay. Lydia King endorses this past Saturday was very stressful and yesterday when her roommate was sent out to the hospital. Lydia King endorses she uses her book to try to [facility] when she becomes anxious. We talked about symptoms of anxiety and coping strategies. We talked about life review as she is widowed and had four children. Her son passed away of a cerebral bleeding but her three daughters are alive and are very supportive, helpful to her. Lydia King endorses that she lived in her own apartment prior to hospitalization. Lydia. King would stay at her apartment during the day and go to [place] home for nighttime. Lydia King endorses Lydia King did not want her to be by herself at night. 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 We talked about the books that she likes to read. We talked  about her work with physical therapy. Walking with her walker and slowly improving. We talked about symptoms of pain and shortness of we talked about symptoms of pain. Lydia. King endorses she had a headache but relieved with Tylenol. Lydia King endorses headaches are not new to  her as she is had them for many years on and off. We talked about symptoms of shortness of breath when she denies. We talked about her appetite. 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 Lydia King talked about the covid vaccine. Lydia. King endorses she has been thinking about covid vaccine for some time and is ready to go ahead and get the shot. Asked Lydia King she discussed with Lydia King and she verbalize that she did. Will revisit with Lydia King and staff for information. We talked about role of palliative care and plan of care. Discuss that will follow up in 2 weeks if needed or sooner should she declined. Lydia jalyric kaestner understanding. I updated nursing staff. I have attempted to contact Lydia King, message left for update on palliative care visit. No changes at present time to goals or plan of care.   Lydia King, Lydia King's daughter returned my call. We talked about purpose of palliative care. We talked about recent palliative care visit with Lydia King. We talked about symptoms, appetite. We talked about anxiety. We talked about prior to hospitalization Lydia King was living at home independently during the day and then would go to [place] in the evening night time. We talked about hospitalization. We talked about recent transition to Short-Term Rehab at [facility]. We talked about therapy. We talked about Lydia King's functional progress. Lydia King endorses she have been working with Social Work to try to obtain Medicaid. We talked about once Short-Term Rehab is completed what is the next step. Lydia King endorses she does not think she would be able to live independently although Lydia King is open for her moving in with her. We talked about functionally what Lydia King would have to be in order for that to happen. Lydia King endorses Lydia King would need to be more independent ambulating, adl's. We talked about option of transition to a long-term care. Lydia King  talked at length about her concerns at the facility including staffing. Recommended to Lydia King to further discuss with facility Administrator, Director of Nursing. Lydia King endorses she has an appointment to do that. We talked about chronic disease progression. We reviewed medical goals of care. We talked about role of palliative care and plan of care. We talked about follow up palliative care visit, continue to monitor and follow while at [facility]. Therapeutic, emotional support provided. Contact information.   Palliative Care was asked to help to continue to address goals of care.   CODE STATUS: DNR  PPS: 40% HOSPICE ELIGIBILITY/DIAGNOSIS: TBD  PAST MEDICAL HISTORY:  Past Medical History:  Diagnosis Date  . Anemia    in past (from blood loss at hiatal hernia)  . Asthma   . Charcot-Marie-Tooth disease    walks with cane (fairly controlled)  . Diverticulosis   . DVT (deep venous thrombosis) ([facility]) 2003   after sx  . GERD (gastroesophageal reflux disease)   . Grief reaction    on fluoxetine  . Headache(784.0)    migraine  . Hiatal hernia   . Hyperlipidemia   . Hypertension   . MI (myocardial infarction) ([facility])   . OA (ocular albinism) ([facility])   . Osteopenia   . PUD (  peptic ulcer disease)    gastric, past  . Shingles   . Stroke ([facility]) 8/10   posterior reversible encephalopathy syndrome  . Urine incontinence     SOCIAL HX:  Social History   Tobacco Use  . Smoking status: Never Smoker  . Smokeless tobacco: Never Used  Substance Use Topics  . Alcohol use: No    ALLERGIES:  Allergies  Allergen Reactions  . Morphine And Related Anaphylaxis  . Other Shortness Of Breath and Other (See Comments)  . Shrimp [Shellfish Allergy] Anaphylaxis    Anaphylaxis-swelling of throat/sob    . Amlodipine Besylate     REACTION: edema  . Atorvastatin     REACTION: pain- could not walk  . Ciprofloxacin Other (See Comments)    REACTION: unkown Trouble breathing  . Influenza Vaccines  Other (See Comments)    Arm swelling, redness, chills, fever  . Nitrofurantoin   . Omeprazole     REACTION: not effective  . Pantoprazole Sodium     REACTION: does not work well  . Penicillin G Hives    ITCHY RASH  . Penicillins     REACTION: itchy rash  . Simvastatin     REACTION: leg pain  . Sulfonamide Derivatives     REACTION: itchy rash  . Tramadol Other (See Comments)    Took for years, but then told this was potential cause of prior strokes and she should not ever take again  . Codeine Rash    REACTION: itchy rash ITCHY RASH, also breathing trouble  . Prednisone Other (See Comments)    REACTION: RASH.// SHORTNESS OF BREATH Prior possible flushing reaction in face, but tolerated it in 2020 and 2021 up to 20mg      PERTINENT MEDICATIONS:  Outpatient Encounter Medications as of 09/28/2019  Medication Sig  . acetaminophen (TYLENOL) 500 MG tablet Take 1,000 mg by mouth every 8 (eight) hours as needed.  Marland Kitchen alum & mag hydroxide-simeth (MAALOX/MYLANTA) 200-200-20 MG/5ML suspension Take 30 mLs by mouth in the morning, at noon, and at bedtime.  Marland Kitchen aspirin EC 81 MG EC tablet Take 1 tablet (81 mg total) by mouth daily. Swallow whole.  Marland Kitchen azelastine (ASTELIN) 0.1 % nasal spray Place 1 spray into both nostrils 2 (two) times daily. Use in each nostril as directed  . Blood Glucose Monitoring Suppl (FREESTYLE LITE) DEVI Use to check blood glucose daily and as needed for diabetes  . calcium-vitamin D (OSCAL WITH D) 500-200 MG-UNIT tablet Take 1 tablet by mouth daily with breakfast.  . Cholecalciferol (VITAMIN D) 2000 UNITS CAPS Take 2 capsules by mouth daily.    . clopidogrel (PLAVIX) 75 MG tablet TAKE 1 TABLET BY MOUTH EVERY DAY  . cyanocobalamin 1000 MCG tablet Take 1,000 mcg by mouth daily.  Marland Kitchen esomeprazole (NEXIUM) 40 MG capsule Take 1 capsule (40 mg total) by mouth daily before breakfast.  . famotidine (PEPCID) 20 MG tablet Take 20 mg by mouth at bedtime.  . ferrous sulfate 325 (65 FE) MG  tablet Take 325 mg by mouth daily with breakfast.  . Flaxseed, Linseed, (FLAX SEED OIL) 1000 MG CAPS Take 1 capsule by mouth daily.    Marland Kitchen FLUoxetine (PROZAC) 20 MG capsule Take 1 capsule (20 mg total) by mouth daily.  . furosemide (LASIX) 40 MG tablet Take 1 tablet (40 mg total) by mouth daily. (Patient taking differently: Take 20 mg by mouth daily. )  . glucose blood (FREESTYLE LITE) test strip Use to check blood sugar once daily for DM (  dx. E11.618)  . isosorbide mononitrate (IMDUR) 30 MG 24 hr tablet Take 1 tablet (30 mg total) by mouth daily.  . Lancets (FREESTYLE) lancets Use as instructedUse to check blood sugar once daily for DM (dx. E11.618)  . lidocaine (LMX) 4 % cream Apply 1 application topically as needed.  Marland Kitchen lisinopril (ZESTRIL) 10 MG tablet Take 1 tablet (10 mg total) by mouth daily.  Marland Kitchen loperamide (IMODIUM) 2 MG capsule Take 2 mg by mouth as needed for diarrhea or loose stools.  Marland Kitchen loratadine (CLARITIN) 10 MG tablet TAKE 1 TABLET BY MOUTH EVERY DAY  . melatonin 3 MG TABS tablet Take 3 mg by mouth at bedtime.  . metFORMIN (GLUCOPHAGE) 1000 MG tablet Take 1 tablet (1,000 mg total) by mouth 2 (two) times daily with a meal.  . metoprolol succinate (TOPROL-XL) 25 MG 24 hr tablet Take 1 tablet (25 mg total) by mouth daily.  . Misc. Devices (WALKER) MISC Wheeled walker with seat- Pt must use for ambulation-- for use as directed: ambulation dx: 729.5, 780.79, 786.05, 784.0, 715.89.  (Patient not taking: Reported on 07/31/2019)  . Multiple Vitamins-Minerals (PRESERVISION AREDS 2+MULTI VIT PO) Take 1 tablet by mouth at bedtime.  . nitroGLYCERIN (NITROSTAT) 0.4 MG SL tablet Place under the tongue.  Marland Kitchen omega-3 acid ethyl esters (LOVAZA) 1 g capsule TAKE 1 CAPSULE (1 G TOTAL) BY MOUTH DAILY.  Marland Kitchen ondansetron (ZOFRAN) 4 MG tablet Take 4 mg by mouth every 4 (four) hours as needed for nausea or vomiting.  . polyethylene glycol (MIRALAX / GLYCOLAX) 17 g packet Take 17 g by mouth daily as needed.  Marland Kitchen PROAIR  HFA 108 (90 Base) MCG/ACT inhaler INHALE 2 PUFFS INTO THE LUNGS EVERY 4 (FOUR) HOURS AS NEEDED FOR WHEEZING OR SHORTNESS OF BREATH.  . rosuvastatin (CRESTOR) 10 MG tablet TAKE 1 TABLET BY MOUTH EVERYDAY AT BEDTIME  . sennosides-docusate sodium (SENOKOT-S) 8.6-50 MG tablet Take 2 tablets by mouth daily.  . traMADol (ULTRAM) 50 MG tablet Take 1 tablet (50 mg total) by mouth every 6 (six) hours as needed for moderate pain.   No facility-administered encounter medications on file as of 09/28/2019.    PHYSICAL EXAM:   General: NAD, pale, pleasant female Cardiovascular: regular rate and rhythm Pulmonary: clear ant fields Neurological: generalized weakness, walks with walker Morenike Cuff Ihor Gully, NP

## 2019-10-10 ENCOUNTER — Telehealth: Payer: Self-pay | Admitting: Family Medicine

## 2019-10-10 NOTE — Telephone Encounter (Signed)
Lydia King (daughter) called stating pt is in skilled nursing [facility] in [place].  She wanted to know if dr tower has any input with pt care.  She stated they have to orders for everything.  She stated pt has history retaining fluid and they would like her to be weigh 2 x week.  She stated pt had heart attack in May.  And they need to watch salt intake. She stated they would like to have her bp checked more often than they do in faculty.  She stated that when pt buzzes for pain meds it make take a long time before they get to her and her bp goes up.  She stated pt has cough now and they are giving her something but are they checking bp ?? Along with giving her meds  Please advise

## 2019-10-10 NOTE — Telephone Encounter (Signed)
Lydia King wanted to know if she could schedule pt cpx.  She stated pt is in wheelchair and nursing home will have to bring her Ok to schedule?  They need PM appointment

## 2019-10-10 NOTE — Telephone Encounter (Signed)
I spoke to [place] and she scheduled wellness visit on 10/31/19.  I mailed the medicare paperwork to [place] and she'll have her mother bring it to the appointment.  Patient will have labs done same day as wellness visit.

## 2019-10-10 NOTE — Telephone Encounter (Signed)
She mentioned cough  Cannot be seen in person if she is coughing  When ready to schedule - can do HTN f/u if she is already utd on PE (unsure)

## 2019-10-12 ENCOUNTER — Non-Acute Institutional Stay: Payer: Medicare Other | Admitting: Nurse Practitioner

## 2019-10-12 ENCOUNTER — Encounter: Payer: Self-pay | Admitting: Nurse Practitioner

## 2019-10-12 ENCOUNTER — Telehealth: Payer: Self-pay

## 2019-10-12 ENCOUNTER — Other Ambulatory Visit: Payer: Self-pay

## 2019-10-12 DIAGNOSIS — Z515 Encounter for palliative care: Secondary | ICD-10-CM

## 2019-10-12 DIAGNOSIS — I5033 Acute on chronic diastolic (congestive) heart failure: Secondary | ICD-10-CM | POA: Diagnosis not present

## 2019-10-12 NOTE — Telephone Encounter (Signed)
Called and spoke with patients daughter and gave recommendations provided by Dr. Rockey Situ in response to her MyChart message as follows:  Can we call patients daughter  On my only visit with her in July,  BP was low, we cut back on imdur.   By the hospice note, sounds like BP went up as she was anxious/stressed about her roommate.  We do not need clonidine for that,  PMD or hospice would prescribe a medication for anxiety such as ativan, etc.   Can she let us know where her BP is running on avg day  Thx  TG   Madaline Savage was very Patent attorney and will notify Hospice nurse. She also stated that her mom is doing much better over the last week and that her BP has been much lower.  She was grateful for the call and follow up.

## 2019-10-12 NOTE — Progress Notes (Signed)
[facility] Consult Note Telephone: [phone]  Fax: [phone]  PATIENT NAME: Lydia King DOB: May 13, 1930 MRN: [id number]  PRIMARY CARE PROVIDER:  Dr Hodges/[facility]\  Responsible party: Thompson Caul daughter [phone] or [phone]  RECOMMENDATIONS and PLAN: 1.Advance Care Planning; DNR in [place]  2. Goals of Care: Goals include to maximize quality of life and symptom management. Our advance care planning conversation included a discussion about:   The value and importance of advance care planning  Exploration of personal, cultural or spiritual beliefs that might influence medical decisions  Exploration of goals of care in the event of a sudden injury or illness  Identification and preparation of a healthcare agent  Review and updating or creation of anadvance directive document.  3.Palliative care encounter; Palliative care encounter; Palliative medicine team will continue to support patient, patient's family, and medical team. Visit consisted of counseling and education dealing with the complex and emotionally intense issues of symptom management and palliative care in the setting of serious and potentially life-threatening illness  4. f/u 2 weeks for ongoing monitoring str, progression, planning for d/c plans or transition to LTC with family, goc I spent 60 minutes providing this consultation,  Start at 12:00pm. More than 50% of the time in this consultation was spent coordinating communication.   HISTORY OF PRESENT ILLNESS:  MORGAINE KIMBALL is a 86 y.o. year old female with multiple medical problems including acute on chronic HF (EF 55% in July 2021), CAD with 2 stents placed, OSA with CPAP use, Asthma, DM2, Osteopenia, Osteoarthritis multiple joints and history of CVA with cortical blindness. Ms Bucaro continues to reside in [place] at [facility]. Ms  Bia does transfer to a wheelchair with assistance. Ms Albert is able to feed herself and appetite has been fair per staff. Ms Kallenberger does require assistance with ADLs including bathing and dressing. No recent falls since last palliative visit. Staff endorses no new changes or concerns. At present Ms Brecheisen is sitting up in bed. Ms Trautmann complains of some intermittent nausea and has had diarrhea yesterday. Ms Reber endorses that she did eat breakfast today and tolerated that fine. Requested staff for antiemetic. We talked about the foods that she likes. We talked about challenges residing at facility. We talked about mobility and exercising. Ms Krienke talked about her roommate. Ms Diaz was cooperative with assessment. We talked about role of palliative care. Ms Christon endorses she was thankful for palliative care visit. Ms Bennie was cooperative with assessment. Emotional support provided. Discuss with Ms Wachtel follow up in 4 weeks to see how she she is doing. Ms Cajas in agreement. I have attempted to contact her daughter [name] for update on palliative care visit, message left. I have updated nursing staff and any changes that current time recommended.  Palliative Care was asked to help to continue to address goals of care.   CODE STATUS: DNR  PPS: 40% HOSPICE ELIGIBILITY/DIAGNOSIS: TBD  PAST MEDICAL HISTORY:  Past Medical History:  Diagnosis Date  . Anemia    in past (from blood loss at hiatal hernia)  . Asthma   . Charcot-Marie-Tooth disease    walks with cane (fairly controlled)  . Diverticulosis   . DVT (deep venous thrombosis) ([place]) 2003   after sx  . GERD (gastroesophageal reflux disease)   . Grief reaction    on fluoxetine  . Headache(784.0)    migraine  . Hiatal hernia   .  Hyperlipidemia   . Hypertension   . MI (myocardial infarction) ([place])   . OA (ocular albinism) ([place])   . Osteopenia   . PUD (peptic ulcer disease)    gastric, past    . Shingles   . Stroke ([place]) 8/10   posterior reversible encephalopathy syndrome  . Urine incontinence     SOCIAL HX:  Social History   Tobacco Use  . Smoking status: Never Smoker  . Smokeless tobacco: Never Used  Substance Use Topics  . Alcohol use: No    ALLERGIES:  Allergies  Allergen Reactions  . Morphine And Related Anaphylaxis  . Other Shortness Of Breath and Other (See Comments)  . Shrimp [Shellfish Allergy] Anaphylaxis    Anaphylaxis-swelling of throat/sob    . Amlodipine Besylate     REACTION: edema  . Atorvastatin     REACTION: pain- could not walk  . Ciprofloxacin Other (See Comments)    REACTION: unkown Trouble breathing  . Influenza Vaccines Other (See Comments)    Arm swelling, redness, chills, fever  . Nitrofurantoin   . Omeprazole     REACTION: not effective  . Pantoprazole Sodium     REACTION: does not work well  . Penicillin G Hives    ITCHY RASH  . Penicillins     REACTION: itchy rash  . Simvastatin     REACTION: leg pain  . Sulfonamide Derivatives     REACTION: itchy rash  . Tramadol Other (See Comments)    Took for years, but then told this was potential cause of prior strokes and she should not ever take again  . Codeine Rash    REACTION: itchy rash ITCHY RASH, also breathing trouble  . Prednisone Other (See Comments)    REACTION: RASH.// SHORTNESS OF BREATH Prior possible flushing reaction in face, but tolerated it in 2020 and 2021 up to 20mg      PERTINENT MEDICATIONS:  Outpatient Encounter Medications as of 10/12/2019  Medication Sig  . acetaminophen (TYLENOL) 500 MG tablet Take 1,000 mg by mouth every 8 (eight) hours as needed.  Marland Kitchen alum & mag hydroxide-simeth (MAALOX/MYLANTA) 200-200-20 MG/5ML suspension Take 30 mLs by mouth in the morning, at noon, and at bedtime.  Marland Kitchen aspirin EC 81 MG EC tablet Take 1 tablet (81 mg total) by mouth daily. Swallow whole.  Marland Kitchen azelastine (ASTELIN) 0.1 % nasal spray Place 1 spray into both nostrils 2 (two)  times daily. Use in each nostril as directed  . Blood Glucose Monitoring Suppl (FREESTYLE LITE) DEVI Use to check blood glucose daily and as needed for diabetes  . calcium-vitamin D (OSCAL WITH D) 500-200 MG-UNIT tablet Take 1 tablet by mouth daily with breakfast.  . Cholecalciferol (VITAMIN D) 2000 UNITS CAPS Take 2 capsules by mouth daily.    . clopidogrel (PLAVIX) 75 MG tablet TAKE 1 TABLET BY MOUTH EVERY DAY  . cyanocobalamin 1000 MCG tablet Take 1,000 mcg by mouth daily.  Marland Kitchen esomeprazole (NEXIUM) 40 MG capsule Take 1 capsule (40 mg total) by mouth daily before breakfast.  . famotidine (PEPCID) 20 MG tablet Take 20 mg by mouth at bedtime.  . ferrous sulfate 325 (65 FE) MG tablet Take 325 mg by mouth daily with breakfast.  . Flaxseed, Linseed, (FLAX SEED OIL) 1000 MG CAPS Take 1 capsule by mouth daily.    Marland Kitchen FLUoxetine (PROZAC) 20 MG capsule Take 1 capsule (20 mg total) by mouth daily.  . furosemide (LASIX) 40 MG tablet Take 1 tablet (40 mg total) by mouth  daily. (Patient taking differently: Take 20 mg by mouth daily. )  . glucose blood (FREESTYLE LITE) test strip Use to check blood sugar once daily for DM (dx. E11.618)  . isosorbide mononitrate (IMDUR) 30 MG 24 hr tablet Take 1 tablet (30 mg total) by mouth daily.  . Lancets (FREESTYLE) lancets Use as instructedUse to check blood sugar once daily for DM (dx. E11.618)  . lidocaine (LMX) 4 % cream Apply 1 application topically as needed.  Marland Kitchen lisinopril (ZESTRIL) 10 MG tablet Take 1 tablet (10 mg total) by mouth daily.  Marland Kitchen loperamide (IMODIUM) 2 MG capsule Take 2 mg by mouth as needed for diarrhea or loose stools.  Marland Kitchen loratadine (CLARITIN) 10 MG tablet TAKE 1 TABLET BY MOUTH EVERY DAY  . melatonin 3 MG TABS tablet Take 3 mg by mouth at bedtime.  . metFORMIN (GLUCOPHAGE) 1000 MG tablet Take 1 tablet (1,000 mg total) by mouth 2 (two) times daily with a meal.  . metoprolol succinate (TOPROL-XL) 25 MG 24 hr tablet Take 1 tablet (25 mg total) by mouth  daily.  . Misc. Devices (WALKER) MISC Wheeled walker with seat- Pt must use for ambulation-- for use as directed: ambulation dx: 729.5, 780.79, 786.05, 784.0, 715.89.  (Patient not taking: Reported on 07/31/2019)  . Multiple Vitamins-Minerals (PRESERVISION AREDS 2+MULTI VIT PO) Take 1 tablet by mouth at bedtime.  . nitroGLYCERIN (NITROSTAT) 0.4 MG SL tablet Place under the tongue.  Marland Kitchen omega-3 acid ethyl esters (LOVAZA) 1 g capsule TAKE 1 CAPSULE (1 G TOTAL) BY MOUTH DAILY.  Marland Kitchen ondansetron (ZOFRAN) 4 MG tablet Take 4 mg by mouth every 4 (four) hours as needed for nausea or vomiting.  . polyethylene glycol (MIRALAX / GLYCOLAX) 17 g packet Take 17 g by mouth daily as needed.  Marland Kitchen PROAIR HFA 108 (90 Base) MCG/ACT inhaler INHALE 2 PUFFS INTO THE LUNGS EVERY 4 (FOUR) HOURS AS NEEDED FOR WHEEZING OR SHORTNESS OF BREATH.  . rosuvastatin (CRESTOR) 10 MG tablet TAKE 1 TABLET BY MOUTH EVERYDAY AT BEDTIME  . sennosides-docusate sodium (SENOKOT-S) 8.6-50 MG tablet Take 2 tablets by mouth daily.  . traMADol (ULTRAM) 50 MG tablet Take 1 tablet (50 mg total) by mouth every 6 (six) hours as needed for moderate pain.   No facility-administered encounter medications on file as of 10/12/2019.    PHYSICAL EXAM:   General: NAD, obese, pleasant female Cardiovascular: regular rate and rhythm Pulmonary: clear ant fields Neurological: generalized weakness  Tram Wrenn Ihor Gully, NP

## 2019-10-14 DIAGNOSIS — R110 Nausea: Secondary | ICD-10-CM | POA: Diagnosis not present

## 2019-10-14 DIAGNOSIS — M79602 Pain in left arm: Secondary | ICD-10-CM | POA: Diagnosis not present

## 2019-10-20 ENCOUNTER — Telehealth: Payer: Self-pay | Admitting: Cardiovascular Disease

## 2019-10-20 NOTE — Telephone Encounter (Signed)
Patient daughter calling  Patient is in [facility] facility Patient has had high BP all weekend - 225/93 and 188/70 Last night she was given a nitroglycerin due to tightness in chest and severe pain in arm Patient did have the first dose of COVID vaccine last Wednesday Would like to discuss and see if an appointment is necessary

## 2019-10-20 NOTE — Telephone Encounter (Signed)
Spoke with patients caregiver and she spoke with director of the facility this morning. The provider covering patients at that facility is on site and assessing patients. They told her he would review and address any concerns. She states that palliative care will also be coming out tomorrow to see her as well. She did mention she is in pain and feels that is an issue as well causing the elevated numbers. She stated that since they are going to see her today and tomorrow she hopes they will address all problems and will await their plan of care. No further questions at this time and will monitor her care.

## 2019-10-21 ENCOUNTER — Encounter: Payer: Self-pay | Admitting: Nurse Practitioner

## 2019-10-21 ENCOUNTER — Other Ambulatory Visit: Payer: Self-pay

## 2019-10-21 ENCOUNTER — Non-Acute Institutional Stay: Payer: Medicare Other | Admitting: Nurse Practitioner

## 2019-10-21 VITALS — BP 140/83 | Wt 144.6 lb

## 2019-10-21 DIAGNOSIS — Z515 Encounter for palliative care: Secondary | ICD-10-CM | POA: Diagnosis not present

## 2019-10-21 DIAGNOSIS — I5033 Acute on chronic diastolic (congestive) heart failure: Secondary | ICD-10-CM | POA: Diagnosis not present

## 2019-10-21 NOTE — Progress Notes (Signed)
[facility] Consult Note Telephone: [phone]  Fax: [phone]  PATIENT NAME: Lydia King DOB: [birth year] MRN: [id number]  PRIMARY CARE PROVIDER:Dr Hodges/[facility]\  Responsible party:Lydia King daughter [phone] or [phone]  RECOMMENDATIONS and PLAN: 1.Advance Care Planning; DNR in [place]  2.Goals of Care: Goals include to maximize quality of life and symptom management. Our advance care planning conversation included a discussion about:   The value and importance of advance care planning  Exploration of personal, cultural or spiritual beliefs that might influence medical decisions  Exploration of goals of care in the event of a sudden injury or illness  Identification and preparation of a healthcare agent  Review and updating or creation of anadvance directive document.  3.Palliative care encounter; Palliative care encounter; Palliative medicine team will continue to support patient, patient's family, and medical team. Visit consisted of counseling and education dealing with the complex and emotionally intense issues of symptom management and palliative care in the setting of serious and potentially life-threatening illness  4. f/u4 weeks for ongoing monitoring str, progression, planning for d/c plans or transition to LTC with family, goc  I spent 55 minutes providing this consultation, starting at 11:00am. More than 50% of the time in this consultation was spent coordinating communication.   HISTORY OF PRESENT ILLNESS:  Lydia King is a 86 y.o. year old female with multiple medical problems including chronic HF (EF 55% in July 2021), CAD with 2 stents placed, OSA with CPAP use, Asthma, DM2, Osteopenia, Osteoarthritis multiple joints and history of CVA with cortical blindness. Lydia King, Daughter called requesting palliative care visit for today for follow up as concerned with  elevated blood pressure. In-person palliative care visit with Ms. Choyce for Palliative Care follow-up. Ms Merkle and I talked about purpose of palliative care. Ms Saperstein in agreement. We talked about how she was feeling today. Ms Brymer endorses she is still not doing well carry yesterday or blood pressure was elevated and her daughter was working with staff to try to collaborate with Cardiology, medications and primary facility provider. It does appear that Ms Morea's blood pressure has improved. We talked about some films of constipation which Ms Pyper is currently dealing with. Ms. Storlie endorses that she has had multiple different about regiments without any result. Ms. Scheiber endorses when her constipation gets to this point only a Dulcolax suppository will help. Ms Robel endorse is she did eat breakfast three cold pancakes. We talked about challenges that facility. We talked about coping strategies. We talked about frustrations relying on others to help with basic needs. Medical goals are reviewed. We talked about Ms Fennelly walking with her walker and doing well. Praised Ms Monte for her efforts. We talked about the anxiety that Ms Mumme has been experiencing. We talked about concerns that she has. Ms Puga endorses that she will be transitioning to long-term care. We talked about Dulcolax suppository ordered her wishes. We talked about role of palliative care and plan of care. Discuss that will follow up in one months if needed her so she decline. I have attempted to call Ms Leiterman's daughter Lydia King for update on palliative care visit today, ducolax suppository ordered and blood pressure seems to have been improving. I updated nursing staff. Palliative Care was asked to help to continue to address goals of care.   CODE STATUS: DNR  PPS: 50% HOSPICE ELIGIBILITY/DIAGNOSIS: TBD  PAST MEDICAL HISTORY:  Past Medical History:  Diagnosis Date  .  Anemia    in  past (from blood loss at hiatal hernia)  . Asthma   . Charcot-Marie-Tooth disease    walks with cane (fairly controlled)  . Diverticulosis   . DVT (deep venous thrombosis) ([place]) 2003   after sx  . GERD (gastroesophageal reflux disease)   . Grief reaction    on fluoxetine  . Headache(784.0)    migraine  . Hiatal hernia   . Hyperlipidemia   . Hypertension   . MI (myocardial infarction) ([place])   . OA (ocular albinism) ([place])   . Osteopenia   . PUD (peptic ulcer disease)    gastric, past  . Shingles   . Stroke ([place]) 8/10   posterior reversible encephalopathy syndrome  . Urine incontinence     SOCIAL HX:  Social History   Tobacco Use  . Smoking status: Never Smoker  . Smokeless tobacco: Never Used  Substance Use Topics  . Alcohol use: No    ALLERGIES:  Allergies  Allergen Reactions  . Morphine And Related Anaphylaxis  . Other Shortness Of Breath and Other (See Comments)  . Shrimp [Shellfish Allergy] Anaphylaxis    Anaphylaxis-swelling of throat/sob    . Amlodipine Besylate     REACTION: edema  . Atorvastatin     REACTION: pain- could not walk  . Ciprofloxacin Other (See Comments)    REACTION: unkown Trouble breathing  . Influenza Vaccines Other (See Comments)    Arm swelling, redness, chills, fever  . Nitrofurantoin   . Omeprazole     REACTION: not effective  . Pantoprazole Sodium     REACTION: does not work well  . Penicillin G Hives    ITCHY RASH  . Penicillins     REACTION: itchy rash  . Simvastatin     REACTION: leg pain  . Sulfonamide Derivatives     REACTION: itchy rash  . Tramadol Other (See Comments)    Took for years, but then told this was potential cause of prior strokes and she should not ever take again  . Codeine Rash    REACTION: itchy rash ITCHY RASH, also breathing trouble  . Prednisone Other (See Comments)    REACTION: RASH.// SHORTNESS OF BREATH Prior possible flushing reaction in face, but tolerated it in 2020 and 2021 up to 20mg       PERTINENT MEDICATIONS:  Outpatient Encounter Medications as of 10/21/2019  Medication Sig  . acetaminophen (TYLENOL) 500 MG tablet Take 1,000 mg by mouth every 8 (eight) hours as needed.  Marland Kitchen alum & mag hydroxide-simeth (MAALOX/MYLANTA) 200-200-20 MG/5ML suspension Take 30 mLs by mouth in the morning, at noon, and at bedtime.  Marland Kitchen aspirin EC 81 MG EC tablet Take 1 tablet (81 mg total) by mouth daily. Swallow whole.  Marland Kitchen azelastine (ASTELIN) 0.1 % nasal spray Place 1 spray into both nostrils 2 (two) times daily. Use in each nostril as directed  . Blood Glucose Monitoring Suppl (FREESTYLE LITE) DEVI Use to check blood glucose daily and as needed for diabetes  . calcium-vitamin D (OSCAL WITH D) 500-200 MG-UNIT tablet Take 1 tablet by mouth daily with breakfast.  . Cholecalciferol (VITAMIN D) 2000 UNITS CAPS Take 2 capsules by mouth daily.    . clopidogrel (PLAVIX) 75 MG tablet TAKE 1 TABLET BY MOUTH EVERY DAY  . cyanocobalamin 1000 MCG tablet Take 1,000 mcg by mouth daily.  Marland Kitchen esomeprazole (NEXIUM) 40 MG capsule Take 1 capsule (40 mg total) by mouth daily before breakfast.  . famotidine (PEPCID) 20 MG tablet  Take 20 mg by mouth at bedtime.  . ferrous sulfate 325 (65 FE) MG tablet Take 325 mg by mouth daily with breakfast.  . Flaxseed, Linseed, (FLAX SEED OIL) 1000 MG CAPS Take 1 capsule by mouth daily.    Marland Kitchen FLUoxetine (PROZAC) 20 MG capsule Take 1 capsule (20 mg total) by mouth daily.  . furosemide (LASIX) 40 MG tablet Take 1 tablet (40 mg total) by mouth daily. (Patient taking differently: Take 20 mg by mouth daily. )  . glucose blood (FREESTYLE LITE) test strip Use to check blood sugar once daily for DM (dx. E11.618)  . isosorbide mononitrate (IMDUR) 30 MG 24 hr tablet Take 1 tablet (30 mg total) by mouth daily.  . Lancets (FREESTYLE) lancets Use as instructedUse to check blood sugar once daily for DM (dx. E11.618)  . lidocaine (LMX) 4 % cream Apply 1 application topically as needed.  Marland Kitchen lisinopril  (ZESTRIL) 10 MG tablet Take 1 tablet (10 mg total) by mouth daily.  Marland Kitchen loperamide (IMODIUM) 2 MG capsule Take 2 mg by mouth as needed for diarrhea or loose stools.  Marland Kitchen loratadine (CLARITIN) 10 MG tablet TAKE 1 TABLET BY MOUTH EVERY DAY  . melatonin 3 MG TABS tablet Take 3 mg by mouth at bedtime.  . metFORMIN (GLUCOPHAGE) 1000 MG tablet Take 1 tablet (1,000 mg total) by mouth 2 (two) times daily with a meal.  . metoprolol succinate (TOPROL-XL) 25 MG 24 hr tablet Take 1 tablet (25 mg total) by mouth daily.  . Misc. Devices (WALKER) MISC Wheeled walker with seat- Pt must use for ambulation-- for use as directed: ambulation dx: 729.5, 780.79, 786.05, 784.0, 715.89.  (Patient not taking: Reported on 07/31/2019)  . Multiple Vitamins-Minerals (PRESERVISION AREDS 2+MULTI VIT PO) Take 1 tablet by mouth at bedtime.  . nitroGLYCERIN (NITROSTAT) 0.4 MG SL tablet Place under the tongue.  Marland Kitchen omega-3 acid ethyl esters (LOVAZA) 1 g capsule TAKE 1 CAPSULE (1 G TOTAL) BY MOUTH DAILY.  Marland Kitchen ondansetron (ZOFRAN) 4 MG tablet Take 4 mg by mouth every 4 (four) hours as needed for nausea or vomiting.  . polyethylene glycol (MIRALAX / GLYCOLAX) 17 g packet Take 17 g by mouth daily as needed.  Marland Kitchen PROAIR HFA 108 (90 Base) MCG/ACT inhaler INHALE 2 PUFFS INTO THE LUNGS EVERY 4 (FOUR) HOURS AS NEEDED FOR WHEEZING OR SHORTNESS OF BREATH.  . rosuvastatin (CRESTOR) 10 MG tablet TAKE 1 TABLET BY MOUTH EVERYDAY AT BEDTIME  . sennosides-docusate sodium (SENOKOT-S) 8.6-50 MG tablet Take 2 tablets by mouth daily.  . traMADol (ULTRAM) 50 MG tablet Take 1 tablet (50 mg total) by mouth every 6 (six) hours as needed for moderate pain.   No facility-administered encounter medications on file as of 10/21/2019.    PHYSICAL EXAM:   General: NAD, frail appearing, pleasant female Cardiovascular: regular rate and rhythm Pulmonary: clear ant fields Abdomen: soft, nontender, + bowel sounds Neurological: generalized weakness, walks with  walker  Taraya Steward Ihor Gully, NP

## 2019-10-24 DIAGNOSIS — L988 Other specified disorders of the skin and subcutaneous tissue: Secondary | ICD-10-CM | POA: Diagnosis not present

## 2019-10-31 ENCOUNTER — Encounter: Payer: Self-pay | Admitting: Family Medicine

## 2019-10-31 ENCOUNTER — Other Ambulatory Visit: Payer: Self-pay

## 2019-10-31 ENCOUNTER — Ambulatory Visit (INDEPENDENT_AMBULATORY_CARE_PROVIDER_SITE_OTHER): Payer: Medicare Other | Admitting: Family Medicine

## 2019-10-31 VITALS — BP 144/82 | HR 81 | Temp 97.5°F | Wt 148.0 lb

## 2019-10-31 DIAGNOSIS — I10 Essential (primary) hypertension: Secondary | ICD-10-CM | POA: Diagnosis not present

## 2019-10-31 DIAGNOSIS — Z0000 Encounter for general adult medical examination without abnormal findings: Secondary | ICD-10-CM

## 2019-10-31 DIAGNOSIS — E119 Type 2 diabetes mellitus without complications: Secondary | ICD-10-CM

## 2019-10-31 DIAGNOSIS — M8000XS Age-related osteoporosis with current pathological fracture, unspecified site, sequela: Secondary | ICD-10-CM

## 2019-10-31 DIAGNOSIS — G8929 Other chronic pain: Secondary | ICD-10-CM | POA: Diagnosis not present

## 2019-10-31 DIAGNOSIS — M5441 Lumbago with sciatica, right side: Secondary | ICD-10-CM

## 2019-10-31 DIAGNOSIS — E785 Hyperlipidemia, unspecified: Secondary | ICD-10-CM

## 2019-10-31 DIAGNOSIS — E1169 Type 2 diabetes mellitus with other specified complication: Secondary | ICD-10-CM

## 2019-10-31 DIAGNOSIS — R2689 Other abnormalities of gait and mobility: Secondary | ICD-10-CM | POA: Diagnosis not present

## 2019-10-31 DIAGNOSIS — E559 Vitamin D deficiency, unspecified: Secondary | ICD-10-CM

## 2019-10-31 DIAGNOSIS — I25118 Atherosclerotic heart disease of native coronary artery with other forms of angina pectoris: Secondary | ICD-10-CM

## 2019-10-31 NOTE — Assessment & Plan Note (Signed)
Ongoing  Needs better pain control  Lidocaine patches in the past Will disc at virtual f/u next week

## 2019-10-31 NOTE — Assessment & Plan Note (Signed)
D level today  Disc imp to bone and overall health   

## 2019-10-31 NOTE — Assessment & Plan Note (Signed)
Reviewed health habits including diet and exercise and skin cancer prevention Reviewed appropriate screening tests for age  Also reviewed health mt list, fam hx and immunization status , as well as social and family history   See HPI Labs ordered  Past age for colon ca screen Declines breast cancer screen Cannot have flu shot due to allergy  Is utd with other vaccines Getting 2nd moderna shot son Declines dexa (has had falls and a fx) -checking D level today  Suggest PT to help with fall risk in her residence No cognitive converns Adv directive is utd Missed highest hearing tone on screen/pt does not desire further eval  ophthy exam due in November -daughter will schedule

## 2019-10-31 NOTE — Assessment & Plan Note (Signed)
Needs PT again to help with this and strength  Will disc at virtual f/u

## 2019-10-31 NOTE — Assessment & Plan Note (Signed)
Disc goals for lipids and reasons to control them Rev last labs with pt Rev low sat fat diet in detail Due for labs on crestor /diet - per pt  If they are giving her crestor at homeplace (may not be) Labs drawn and will make plan from there

## 2019-10-31 NOTE — Assessment & Plan Note (Signed)
bp in fair control here but higher at home and with headaches  BP Readings from Last 1 Encounters:  10/31/19 (!) 144/82   No changes needed Most recent labs reviewed  Disc lifstyle change with low sodium diet and exercise  Now on lasix 20 (? Supposed to be 40) Lisinopril 10 mg  Metoprolol xl 25 mg daily  imdur 30 mg daily  Pending lab results-will make a plan to improve bp Will have a virtual visit to disc med management next week

## 2019-10-31 NOTE — Progress Notes (Signed)
Subjective:    Patient ID: Lydia King, female    DOB: [birth year], 86 y.o.   MRN: [id number]  This visit occurred during the SARS-CoV-2 public health emergency.  Safety protocols were in place, including screening questions prior to the visit, additional usage of staff PPE, and extensive cleaning of exam room while observing appropriate contact time as indicated for disinfecting solutions.    HPI Pt presents for amw and f/u of chronic medical problems   I have personally reviewed the Medicare Annual Wellness questionnaire and have noted 1. The patient's medical and social history 2. Their use of alcohol, tobacco or illicit drugs 3. Their current medications and supplements 4. The patient's functional ability including ADL's, fall risks, home safety risks and hearing or visual             impairment. 5. Diet and physical activities 6. Evidence for depression or mood disorders  The patients weight, height, BMI have been recorded in the chart and visual acuity is per eye clinic.  I have made referrals, counseling and provided education to the patient based review of the above and I have provided the pt with a written personalized care plan for preventive services. Reviewed and updated provider list, see scanned forms.  See scanned forms.  Routine anticipatory guidance given to patient.  See health maintenance. Colon cancer screening- past age Breast cancer screening -does not do -declines  Self breast exam--no lumps  Flu vaccine -cannot have due to allergy vaccine  Tetanus vaccine 5/09- def due to insurance Pneumovax-completed Zoster vaccine covid - has had one shot and this Wednesday is the 2nd one / moderna (10/19/19) Mild reaction- arm was a little sore and red  Dexa-declined Falls-2 falls  Fractures-femur fracture Supplements- ca and D  Exercise -nothing now/ last PT was in June /can walk with a walker   Advance directive -has it utd   Daughter is poa/ is DNR - has had  a palliative care interaction  Cognitive function addressed- see scanned forms- and if abnormal then additional documentation follows.   Thinks she is getting more forgetful  No confusion at all  Does not get lost  Adjusts well to change  Keeps track of numbers and bp   PMH and SH reviewed  Meds, vitals, and allergies reviewed.   ROS: See HPI.  Otherwise negative.    Weight : Wt Readings from Last 3 Encounters:  10/31/19 148 lb (67.1 kg)  10/21/19 144 lb 9.6 oz (65.6 kg)  09/28/19 154 lb 8 oz (70.1 kg)   30.93 kg/m Appetite is fine  Does not like food at her facility -too much sodium and processed foods (does not eat all of it)  Does supplement yogurt and fruit   Hearing/vision:  Hearing Screening   125Hz  250Hz  500Hz  1000Hz  2000Hz  3000Hz  4000Hz  6000Hz  8000Hz   Right ear:   40 40 40  0    Left ear:   40 40 40  0    no problems   Last November had her regular eye exam / diabetic exam  Daughter plans to schedule    Care team [facility] [place]- PCP Rockey Situ- cardiology Micheal Likens- orthopedics  NP Gusler-palliative care   She lives in facility -[facility]   HTN (in setting of CAD and OSA) bp is stable today here-higher at home  No cp or palpitations or edema but has headaches No side effects to medicines  BP Readings from Last 3 Encounters:  10/31/19 (!) 144/82  10/21/19 140/83  09/28/19 130/60     BP is labile at home (initially from pain, now just more in am with headache)  Pulse Readings from Last 3 Encounters:  10/31/19 81  09/28/19 68  08/26/19 70  lasix Lisinopril Metoprolol  Isosorbide   ? Clonidine 0.5    Has a headache- worse than in the past  R forehead hurts  Ear is squishy sounding when she swallows No congestion  Has had some drainage   She is not getting a lot of her medicines at the care facility    DM2 Lab Results  Component Value Date   HGBA1C 6.6 (H) 08/01/2019  taking metformin 1000 mg bid Does not thinks blood sugar is out of whack   Gets glucose checks 140 and below (occ higher)    Lab Results  Component Value Date   CREATININE 0.85 08/04/2019   BUN 28 (H) 08/04/2019   NA 131 (L) 08/04/2019   K 4.2 08/04/2019   CL 93 (L) 08/04/2019   CO2 27 08/04/2019    Hyperlipidemia  On low dose crestor and diet  Lab Results  Component Value Date   CHOL 156 07/27/2018   HDL 60.50 07/27/2018   LDLCALC 75 07/27/2018   LDLDIRECT 78.0 07/14/2016   TRIG 103.0 07/27/2018   CHOLHDL 3 07/27/2018   Due for labs  Says the home is not giving her crestor   Uses lidocaine patches for back   Balance is worse  Not getting PT because she is a long term resident w/o an order   Wants to address : Pain control -issue is her back not hip  PT BP Headaches Medication orders   Patient Active Problem List   Diagnosis Date Noted  . CAD (coronary artery disease) 08/01/2019  . Anemia 08/01/2019  . Acute on chronic congestive heart failure ([place]) 07/31/2019  . Hyperlipidemia associated with type 2 diabetes mellitus ([place]) 07/27/2018  . Sinus congestion 02/05/2018  . Vertigo 05/06/2017  . OSA (obstructive sleep apnea) 01/02/2016  . Pedal edema 01/02/2016  . Fall in home 12/11/2015  . Poor balance 12/11/2015  . Low back pain with sciatica 10/02/2015  . Right hip pain 10/02/2015  . Hearing loss 04/18/2015  . Encounter for Medicare annual wellness exam 10/04/2013  . Type 2 diabetes mellitus without complications ([place]) 27/25/3664  . History of CVA (cerebrovascular accident) 09/12/2008  . OSTEOARTHRITIS, MULTIPLE JOINTS 05/30/2008  . Osteoporosis 05/30/2008  . Vitamin D deficiency 07/14/2007  . Sleep apnea 07/14/2007  . Essential hypertension 11/30/2006  . Asthma 11/30/2006  . H/O gastroesophageal reflux (GERD) 11/30/2006  . G I BLEED 11/30/2006  . DEEP VENOUS THROMBOPHLEBITIS, HX OF 11/30/2006   Past Medical History:  Diagnosis Date  . Anemia    in past (from blood loss at hiatal hernia)  . Asthma   .  Charcot-Marie-Tooth disease    walks with cane (fairly controlled)  . Diverticulosis   . DVT (deep venous thrombosis) ([place]) 2003   after sx  . GERD (gastroesophageal reflux disease)   . Grief reaction    on fluoxetine  . Headache(784.0)    migraine  . Hiatal hernia   . Hyperlipidemia   . Hypertension   . MI (myocardial infarction) ([place])   . OA (ocular albinism) ([place])   . Osteopenia   . PUD (peptic ulcer disease)    gastric, past  . Shingles   . Stroke ([place]) 8/10   posterior reversible encephalopathy syndrome  . Urine incontinence    Past Surgical  History:  Procedure Laterality Date  . carotid dopplers  8/10  . COLONOSCOPY  2002  . HERNIA REPAIR  2002   bleeding  . HIATAL HERNIA REPAIR  2000   needed blood transfusion  . Stroke  8/10   hosp stroke/ posterior (vs posterior reversible encephalopathy syndrome) - d/c on Plavix  . TEE normal  7/35   no emoblic source  . TOTAL KNEE ARTHROPLASTY  03,06   Bilateral  . VESICOVAGINAL FISTULA CLOSURE W/ TAH  1964   with bleeding and cervical dysplasia   Social History   Tobacco Use  . Smoking status: Never Smoker  . Smokeless tobacco: Never Used  Vaping Use  . Vaping Use: Never used  Substance Use Topics  . Alcohol use: No  . Drug use: No   Family History  Problem Relation Age of Onset  . Cerebral aneurysm Son   . Cerebral aneurysm Father   . Hyperlipidemia Father   . Alcohol abuse Father   . Stroke Mother   . Cervical cancer Sister   . Hyperlipidemia Brother   . Alcohol abuse Brother   . Stroke Brother   . Diabetes Brother   . Diabetes Sister   . Sarcoidosis Sister   . Other Sister        OP/ Broken hip  . Other Other        whole family has charcot marie tooth  . Ovarian cancer Sister    Allergies  Allergen Reactions  . Morphine And Related Anaphylaxis  . Other Shortness Of Breath and Other (See Comments)  . Shrimp [Shellfish Allergy] Anaphylaxis    Anaphylaxis-swelling of throat/sob    . Amlodipine  Besylate     REACTION: edema  . Atorvastatin     REACTION: pain- could not walk  . Ciprofloxacin Other (See Comments)    REACTION: unkown Trouble breathing  . Influenza Vaccines Other (See Comments)    Arm swelling, redness, chills, fever  . Nitrofurantoin   . Omeprazole     REACTION: not effective  . Pantoprazole Sodium     REACTION: does not work well  . Penicillin G Hives    ITCHY RASH  . Penicillins     REACTION: itchy rash  . Simvastatin     REACTION: leg pain  . Sulfonamide Derivatives     REACTION: itchy rash  . Tramadol Other (See Comments)    Took for years, but then told this was potential cause of prior strokes and she should not ever take again  . Codeine Rash    REACTION: itchy rash ITCHY RASH, also breathing trouble  . Prednisone Other (See Comments)    REACTION: RASH.// SHORTNESS OF BREATH Prior possible flushing reaction in face, but tolerated it in 2020 and 2021 up to 20mg    Current Outpatient Medications on File Prior to Visit  Medication Sig Dispense Refill  . acetaminophen (TYLENOL) 500 MG tablet Take 1,000 mg by mouth every 8 (eight) hours as needed.    Marland Kitchen alum & mag hydroxide-simeth (MAALOX/MYLANTA) 200-200-20 MG/5ML suspension Take 30 mLs by mouth in the morning, at noon, and at bedtime.    Marland Kitchen aspirin EC 81 MG EC tablet Take 1 tablet (81 mg total) by mouth daily. Swallow whole. 30 tablet 11  . azelastine (ASTELIN) 0.1 % nasal spray Place 1 spray into both nostrils 2 (two) times daily. Use in each nostril as directed 30 mL 0  . Blood Glucose Monitoring Suppl (FREESTYLE LITE) DEVI Use to check blood glucose  daily and as needed for diabetes 1 each 0  . calcium-vitamin D (OSCAL WITH D) 500-200 MG-UNIT tablet Take 1 tablet by mouth daily with breakfast.    . Cholecalciferol (VITAMIN D) 2000 UNITS CAPS Take 2 capsules by mouth daily.      . clopidogrel (PLAVIX) 75 MG tablet TAKE 1 TABLET BY MOUTH EVERY DAY 90 tablet 3  . cyanocobalamin 1000 MCG tablet Take  1,000 mcg by mouth daily.    Marland Kitchen esomeprazole (NEXIUM) 40 MG capsule Take 1 capsule (40 mg total) by mouth daily before breakfast. 90 capsule 3  . famotidine (PEPCID) 20 MG tablet Take 20 mg by mouth at bedtime.    . ferrous sulfate 325 (65 FE) MG tablet Take 325 mg by mouth daily with breakfast.    . Flaxseed, Linseed, (FLAX SEED OIL) 1000 MG CAPS Take 1 capsule by mouth daily.      Marland Kitchen FLUoxetine (PROZAC) 20 MG capsule Take 1 capsule (20 mg total) by mouth daily. 90 capsule 3  . furosemide (LASIX) 40 MG tablet Take 1 tablet (40 mg total) by mouth daily. (Patient taking differently: Take 20 mg by mouth daily. )    . glucose blood (FREESTYLE LITE) test strip Use to check blood sugar once daily for DM (dx. E11.618) 100 each 0  . isosorbide mononitrate (IMDUR) 30 MG 24 hr tablet Take 1 tablet (30 mg total) by mouth daily. 90 tablet 3  . Lancets (FREESTYLE) lancets Use as instructedUse to check blood sugar once daily for DM (dx. E11.618) 100 each 0  . lidocaine (LMX) 4 % cream Apply 1 application topically as needed.    Marland Kitchen lisinopril (ZESTRIL) 10 MG tablet Take 1 tablet (10 mg total) by mouth daily.    Marland Kitchen loperamide (IMODIUM) 2 MG capsule Take 2 mg by mouth as needed for diarrhea or loose stools.    Marland Kitchen loratadine (CLARITIN) 10 MG tablet TAKE 1 TABLET BY MOUTH EVERY DAY 90 tablet 1  . melatonin 3 MG TABS tablet Take 3 mg by mouth at bedtime.    . metFORMIN (GLUCOPHAGE) 1000 MG tablet Take 1 tablet (1,000 mg total) by mouth 2 (two) times daily with a meal. 180 tablet 3  . metoprolol succinate (TOPROL-XL) 25 MG 24 hr tablet Take 1 tablet (25 mg total) by mouth daily.    . Misc. Devices (WALKER) MISC Wheeled walker with seat- Pt must use for ambulation-- for use as directed: ambulation dx: 729.5, 780.79, 786.05, 784.0, 715.89.  (Patient not taking: Reported on 07/31/2019)    . Multiple Vitamins-Minerals (PRESERVISION AREDS 2+MULTI VIT PO) Take 1 tablet by mouth at bedtime.    . nitroGLYCERIN (NITROSTAT) 0.4 MG SL  tablet Place under the tongue.    Marland Kitchen omega-3 acid ethyl esters (LOVAZA) 1 g capsule TAKE 1 CAPSULE (1 G TOTAL) BY MOUTH DAILY. 90 capsule 3  . ondansetron (ZOFRAN) 4 MG tablet Take 4 mg by mouth every 4 (four) hours as needed for nausea or vomiting.    . polyethylene glycol (MIRALAX / GLYCOLAX) 17 g packet Take 17 g by mouth daily as needed.    Marland Kitchen PROAIR HFA 108 (90 Base) MCG/ACT inhaler INHALE 2 PUFFS INTO THE LUNGS EVERY 4 (FOUR) HOURS AS NEEDED FOR WHEEZING OR SHORTNESS OF BREATH. 8.5 g 3  . rosuvastatin (CRESTOR) 10 MG tablet TAKE 1 TABLET BY MOUTH EVERYDAY AT BEDTIME 90 tablet 3  . sennosides-docusate sodium (SENOKOT-S) 8.6-50 MG tablet Take 2 tablets by mouth daily.    Marland Kitchen  traMADol (ULTRAM) 50 MG tablet Take 1 tablet (50 mg total) by mouth every 6 (six) hours as needed for moderate pain. 30 tablet 0   No current facility-administered medications on file prior to visit.    Review of Systems  Constitutional: Positive for fatigue. Negative for activity change, appetite change, fever and unexpected weight change.  HENT: Negative for congestion, ear pain, rhinorrhea, sinus pressure and sore throat.   Eyes: Negative for pain, redness and visual disturbance.  Respiratory: Negative for cough, shortness of breath and wheezing.   Cardiovascular: Negative for chest pain and palpitations.  Gastrointestinal: Negative for abdominal pain, blood in stool, constipation and diarrhea.  Endocrine: Negative for polydipsia and polyuria.  Genitourinary: Negative for dysuria, frequency and urgency.  Musculoskeletal: Negative for arthralgias, back pain and myalgias.  Skin: Negative for pallor and rash.  Allergic/Immunologic: Negative for environmental allergies.  Neurological: Positive for headaches. Negative for dizziness, syncope, speech difficulty and numbness.       Generalized weakness and poor balance  Hematological: Negative for adenopathy. Does not bruise/bleed easily.  Psychiatric/Behavioral: Negative  for decreased concentration, dysphoric mood and sleep disturbance. The patient is not nervous/anxious.        Objective:   Physical Exam Constitutional:      General: She is not in acute distress.    Appearance: Normal appearance. She is well-developed. She is obese. She is not ill-appearing or diaphoretic.     Comments: Frail appearing elderly female in wheelchair  HENT:     Head: Normocephalic and atraumatic.     Right Ear: Tympanic membrane, ear canal and external ear normal.     Left Ear: Tympanic membrane, ear canal and external ear normal.     Nose: Nose normal. No congestion.     Mouth/Throat:     Mouth: Mucous membranes are moist.     Pharynx: Oropharynx is clear. No posterior oropharyngeal erythema.  Eyes:     General: No scleral icterus.    Extraocular Movements: Extraocular movements intact.     Conjunctiva/sclera: Conjunctivae normal.     Pupils: Pupils are equal, round, and reactive to light.  Neck:     Thyroid: No thyromegaly.     Vascular: No carotid bruit or JVD.  Cardiovascular:     Rate and Rhythm: Normal rate and regular rhythm.     Pulses: Normal pulses.     Heart sounds: Normal heart sounds. No gallop.   Pulmonary:     Effort: Pulmonary effort is normal. No respiratory distress.     Breath sounds: Normal breath sounds. No wheezing.     Comments: Good air exch Chest:     Chest wall: No tenderness.  Abdominal:     General: Bowel sounds are normal. There is no distension or abdominal bruit.     Palpations: Abdomen is soft. There is no mass.     Tenderness: There is no abdominal tenderness.     Hernia: No hernia is present.  Genitourinary:    Comments: Breast exam: No mass, nodules, thickening, tenderness, bulging, retraction, inflamation, nipple discharge or skin changes noted.  No axillary or clavicular LA.     Exam done sitting Musculoskeletal:        General: No tenderness. Normal range of motion.     Cervical back: Normal range of motion and neck  supple. No rigidity. No muscular tenderness.     Right lower leg: No edema.     Left lower leg: No edema.     Comments: Mild  kyphosis   Lymphadenopathy:     Cervical: No cervical adenopathy.  Skin:    General: Skin is warm and dry.     Coloration: Skin is not pale.     Findings: No erythema or rash.  Neurological:     Mental Status: She is alert. Mental status is at baseline.     Cranial Nerves: No cranial nerve deficit.     Motor: No abnormal muscle tone.     Coordination: Coordination normal.     Gait: Gait normal.     Deep Tendon Reflexes: Reflexes are normal and symmetric. Reflexes normal.  Psychiatric:        Mood and Affect: Mood normal.        Cognition and Memory: Cognition and memory normal.           Assessment & Plan:   Problem List Items Addressed This Visit      Cardiovascular and Mediastinum   Essential hypertension    bp in fair control here but higher at home and with headaches  BP Readings from Last 1 Encounters:  10/31/19 (!) 144/82   No changes needed Most recent labs reviewed  Disc lifstyle change with low sodium diet and exercise  Now on lasix 20 (? Supposed to be 40) Lisinopril 10 mg  Metoprolol xl 25 mg daily  imdur 30 mg daily  Pending lab results-will make a plan to improve bp Will have a virtual visit to disc med management next week      Relevant Orders   CBC with Differential/Platelet   Comprehensive metabolic panel   Lipid panel   TSH     Endocrine   Type 2 diabetes mellitus without complications ([place])    Has been controlled with metformin  Stable per pt  Glucose readings under 140 generally  a1c today  On statin and ace Needs nail care/dm foot eval- ref made to podiatry      Relevant Orders   Hemoglobin A1c   Ambulatory referral to Podiatry   Hyperlipidemia associated with type 2 diabetes mellitus ([place])    Disc goals for lipids and reasons to control them Rev last labs with pt Rev low sat fat diet in detail Due for  labs on crestor /diet - per pt  If they are giving her crestor at [facility] (may not be) Labs drawn and will make plan from there      Relevant Orders   Lipid panel     Nervous and Auditory   Low back pain with sciatica    Ongoing  Needs better pain control  Lidocaine patches in the past Will disc at virtual f/u next week        Musculoskeletal and Integument   Osteoporosis    Declines dexa  Several falls and 1 fracture (femur) Taking ca and D Will get D level today Poor balance-will need PT for both strength and balance        Other   Vitamin D deficiency    D level today  Disc imp to bone and overall health      Relevant Orders   VITAMIN D 25 Hydroxy (Vit-D Deficiency, Fractures)   Encounter for Medicare annual wellness exam - Primary    Reviewed health habits including diet and exercise and skin cancer prevention Reviewed appropriate screening tests for age  Also reviewed health mt list, fam hx and immunization status , as well as social and family history   See HPI Labs ordered  Past age  for colon ca screen Declines breast cancer screen Cannot have flu shot due to allergy  Is utd with other vaccines Getting 2nd moderna shot son Declines dexa (has had falls and a fx) -checking D level today  Suggest PT to help with fall risk in her residence No cognitive converns Adv directive is utd Missed highest hearing tone on screen/pt does not desire further eval  ophthy exam due in November -daughter will schedule         Poor balance    Needs PT again to help with this and strength  Will disc at virtual f/u

## 2019-10-31 NOTE — Assessment & Plan Note (Addendum)
Has been controlled with metformin  Stable per pt  Glucose readings under 140 generally  a1c today  On statin and ace Needs nail care/dm foot eval- ref made to podiatry

## 2019-10-31 NOTE — Assessment & Plan Note (Signed)
Declines dexa  Several falls and 1 fracture (femur) Taking ca and D Will get D level today Poor balance-will need PT for both strength and balance

## 2019-10-31 NOTE — Patient Instructions (Addendum)
Labs today   Lets set up a virtual visit (phone) next week to review medicines and work on a PT referral [place] control etc and make a plan for blood pressure   The office will call you to set up a podiatry referral

## 2019-11-01 DIAGNOSIS — M25511 Pain in right shoulder: Secondary | ICD-10-CM | POA: Diagnosis not present

## 2019-11-01 DIAGNOSIS — R2231 Localized swelling, mass and lump, right upper limb: Secondary | ICD-10-CM | POA: Diagnosis not present

## 2019-11-01 LAB — CBC WITH DIFFERENTIAL/PLATELET
Basophils Absolute: 0.1 10*3/uL (ref 0.0–0.1)
Basophils Relative: 1 % (ref 0.0–3.0)
Eosinophils Absolute: 0.1 10*3/uL (ref 0.0–0.7)
Eosinophils Relative: 1 % (ref 0.0–5.0)
HCT: 38.7 % (ref 36.0–46.0)
Hemoglobin: 12.7 g/dL (ref 12.0–15.0)
Lymphocytes Relative: 24 % (ref 12.0–46.0)
Lymphs Abs: 1.6 10*3/uL (ref 0.7–4.0)
MCHC: 32.8 g/dL (ref 30.0–36.0)
MCV: 88 fl (ref 78.0–100.0)
Monocytes Absolute: 0.4 10*3/uL (ref 0.1–1.0)
Monocytes Relative: 6.2 % (ref 3.0–12.0)
Neutro Abs: 4.6 10*3/uL (ref 1.4–7.7)
Neutrophils Relative %: 67.8 % (ref 43.0–77.0)
Platelets: 211 10*3/uL (ref 150.0–400.0)
RBC: 4.4 Mil/uL (ref 3.87–5.11)
RDW: 14.6 % (ref 11.5–15.5)
WBC: 6.8 10*3/uL (ref 4.0–10.5)

## 2019-11-01 LAB — LIPID PANEL
Cholesterol: 179 mg/dL (ref 0–200)
HDL: 57.6 mg/dL (ref >39.00)
LDL Cholesterol: 85 mg/dL (ref 0–99)
NonHDL: 121.23
Total CHOL/HDL Ratio: 3
Triglycerides: 180 mg/dL — ABNORMAL HIGH (ref 0.0–149.0)
VLDL: 36 mg/dL (ref 0.0–40.0)

## 2019-11-01 LAB — VITAMIN D 25 HYDROXY (VIT D DEFICIENCY, FRACTURES): VITD: 92.39 ng/mL (ref 30.00–100.00)

## 2019-11-01 LAB — COMPREHENSIVE METABOLIC PANEL
ALT: 11 U/L (ref 0–35)
AST: 12 U/L (ref 0–37)
Albumin: 4 g/dL (ref 3.5–5.2)
Alkaline Phosphatase: 72 U/L (ref 39–117)
BUN: 13 mg/dL (ref 6–23)
CO2: 30 mEq/L (ref 19–32)
Calcium: 9.9 mg/dL (ref 8.4–10.5)
Chloride: 97 mEq/L (ref 96–112)
Creatinine, Ser: 0.71 mg/dL (ref 0.40–1.20)
GFR: 76.47 mL/min (ref >60.00)
Glucose, Bld: 125 mg/dL — ABNORMAL HIGH (ref 70–99)
Potassium: 4.1 mEq/L (ref 3.5–5.1)
Sodium: 137 mEq/L (ref 135–145)
Total Bilirubin: 0.4 mg/dL (ref 0.2–1.2)
Total Protein: 6.4 g/dL (ref 6.0–8.3)

## 2019-11-01 LAB — HEMOGLOBIN A1C: Hgb A1c MFr Bld: 7 % — ABNORMAL HIGH (ref 4.6–6.5)

## 2019-11-01 LAB — TSH: TSH: 1.45 u[IU]/mL (ref 0.35–4.50)

## 2019-11-02 ENCOUNTER — Telehealth: Payer: Self-pay | Admitting: Family Medicine

## 2019-11-02 ENCOUNTER — Inpatient Hospital Stay
Admission: EM | Admit: 2019-11-02 | Discharge: 2019-11-04 | DRG: 303 | Disposition: A | Discharge status: Skilled Nursing Facility | Payer: Medicare Other | Source: Skilled Nursing Facility | Attending: Internal Medicine | Admitting: Internal Medicine

## 2019-11-02 ENCOUNTER — Emergency Department: Payer: Medicare Other

## 2019-11-02 ENCOUNTER — Encounter: Payer: Self-pay | Admitting: Emergency Medicine

## 2019-11-02 ENCOUNTER — Telehealth: Payer: Self-pay | Admitting: Cardiovascular Disease

## 2019-11-02 ENCOUNTER — Telehealth: Payer: Self-pay | Admitting: *Deleted

## 2019-11-02 ENCOUNTER — Other Ambulatory Visit: Payer: Self-pay

## 2019-11-02 DIAGNOSIS — Z955 Presence of coronary angioplasty implant and graft: Secondary | ICD-10-CM

## 2019-11-02 DIAGNOSIS — I10 Essential (primary) hypertension: Secondary | ICD-10-CM | POA: Diagnosis present

## 2019-11-02 DIAGNOSIS — Z8041 Family history of malignant neoplasm of ovary: Secondary | ICD-10-CM

## 2019-11-02 DIAGNOSIS — R0789 Other chest pain: Secondary | ICD-10-CM | POA: Diagnosis not present

## 2019-11-02 DIAGNOSIS — Z66 Do not resuscitate: Secondary | ICD-10-CM | POA: Diagnosis present

## 2019-11-02 DIAGNOSIS — Z83438 Family history of other disorder of lipoprotein metabolism and other lipidemia: Secondary | ICD-10-CM

## 2019-11-02 DIAGNOSIS — Z7902 Long term (current) use of antithrombotics/antiplatelets: Secondary | ICD-10-CM

## 2019-11-02 DIAGNOSIS — Z20822 Contact with and (suspected) exposure to covid-19: Secondary | ICD-10-CM | POA: Diagnosis present

## 2019-11-02 DIAGNOSIS — Z8711 Personal history of peptic ulcer disease: Secondary | ICD-10-CM

## 2019-11-02 DIAGNOSIS — G4733 Obstructive sleep apnea (adult) (pediatric): Secondary | ICD-10-CM | POA: Diagnosis present

## 2019-11-02 DIAGNOSIS — Z882 Allergy status to sulfonamides status: Secondary | ICD-10-CM

## 2019-11-02 DIAGNOSIS — I25118 Atherosclerotic heart disease of native coronary artery with other forms of angina pectoris: Principal | ICD-10-CM | POA: Diagnosis present

## 2019-11-02 DIAGNOSIS — K219 Gastro-esophageal reflux disease without esophagitis: Secondary | ICD-10-CM | POA: Diagnosis present

## 2019-11-02 DIAGNOSIS — Z880 Allergy status to penicillin: Secondary | ICD-10-CM

## 2019-11-02 DIAGNOSIS — E119 Type 2 diabetes mellitus without complications: Secondary | ICD-10-CM | POA: Diagnosis present

## 2019-11-02 DIAGNOSIS — Z885 Allergy status to narcotic agent status: Secondary | ICD-10-CM

## 2019-11-02 DIAGNOSIS — R0602 Shortness of breath: Secondary | ICD-10-CM | POA: Diagnosis not present

## 2019-11-02 DIAGNOSIS — Z7982 Long term (current) use of aspirin: Secondary | ICD-10-CM

## 2019-11-02 DIAGNOSIS — I200 Unstable angina: Secondary | ICD-10-CM | POA: Diagnosis present

## 2019-11-02 DIAGNOSIS — Z8049 Family history of malignant neoplasm of other genital organs: Secondary | ICD-10-CM

## 2019-11-02 DIAGNOSIS — K449 Diaphragmatic hernia without obstruction or gangrene: Secondary | ICD-10-CM | POA: Diagnosis present

## 2019-11-02 DIAGNOSIS — Z8673 Personal history of transient ischemic attack (TIA), and cerebral infarction without residual deficits: Secondary | ICD-10-CM

## 2019-11-02 DIAGNOSIS — T383X5A Adverse effect of insulin and oral hypoglycemic [antidiabetic] drugs, initial encounter: Secondary | ICD-10-CM | POA: Diagnosis present

## 2019-11-02 DIAGNOSIS — R001 Bradycardia, unspecified: Secondary | ICD-10-CM | POA: Diagnosis present

## 2019-11-02 DIAGNOSIS — E785 Hyperlipidemia, unspecified: Secondary | ICD-10-CM | POA: Diagnosis present

## 2019-11-02 DIAGNOSIS — I110 Hypertensive heart disease with heart failure: Secondary | ICD-10-CM | POA: Diagnosis present

## 2019-11-02 DIAGNOSIS — G8929 Other chronic pain: Secondary | ICD-10-CM | POA: Diagnosis present

## 2019-11-02 DIAGNOSIS — Z833 Family history of diabetes mellitus: Secondary | ICD-10-CM

## 2019-11-02 DIAGNOSIS — I5042 Chronic combined systolic (congestive) and diastolic (congestive) heart failure: Secondary | ICD-10-CM | POA: Diagnosis not present

## 2019-11-02 DIAGNOSIS — Z86718 Personal history of other venous thrombosis and embolism: Secondary | ICD-10-CM

## 2019-11-02 DIAGNOSIS — Z91013 Allergy to seafood: Secondary | ICD-10-CM

## 2019-11-02 DIAGNOSIS — I350 Nonrheumatic aortic (valve) stenosis: Secondary | ICD-10-CM | POA: Diagnosis present

## 2019-11-02 DIAGNOSIS — R197 Diarrhea, unspecified: Secondary | ICD-10-CM | POA: Diagnosis present

## 2019-11-02 DIAGNOSIS — Z96653 Presence of artificial knee joint, bilateral: Secondary | ICD-10-CM | POA: Diagnosis present

## 2019-11-02 DIAGNOSIS — G43909 Migraine, unspecified, not intractable, without status migrainosus: Secondary | ICD-10-CM | POA: Diagnosis present

## 2019-11-02 DIAGNOSIS — R079 Chest pain, unspecified: Secondary | ICD-10-CM | POA: Diagnosis present

## 2019-11-02 DIAGNOSIS — Z823 Family history of stroke: Secondary | ICD-10-CM

## 2019-11-02 DIAGNOSIS — Z811 Family history of alcohol abuse and dependence: Secondary | ICD-10-CM

## 2019-11-02 DIAGNOSIS — Z79899 Other long term (current) drug therapy: Secondary | ICD-10-CM

## 2019-11-02 DIAGNOSIS — Z888 Allergy status to other drugs, medicaments and biological substances status: Secondary | ICD-10-CM

## 2019-11-02 DIAGNOSIS — R32 Unspecified urinary incontinence: Secondary | ICD-10-CM | POA: Diagnosis present

## 2019-11-02 DIAGNOSIS — Z7984 Long term (current) use of oral hypoglycemic drugs: Secondary | ICD-10-CM

## 2019-11-02 DIAGNOSIS — I252 Old myocardial infarction: Secondary | ICD-10-CM

## 2019-11-02 DIAGNOSIS — J45909 Unspecified asthma, uncomplicated: Secondary | ICD-10-CM | POA: Diagnosis not present

## 2019-11-02 DIAGNOSIS — Z887 Allergy status to serum and vaccine status: Secondary | ICD-10-CM

## 2019-11-02 DIAGNOSIS — I5032 Chronic diastolic (congestive) heart failure: Secondary | ICD-10-CM

## 2019-11-02 DIAGNOSIS — M8580 Other specified disorders of bone density and structure, unspecified site: Secondary | ICD-10-CM | POA: Diagnosis present

## 2019-11-02 HISTORY — DX: Unspecified diastolic (congestive) heart failure: I50.30

## 2019-11-02 HISTORY — DX: Atherosclerotic heart disease of native coronary artery without angina pectoris: I25.10

## 2019-11-02 LAB — CBC WITH DIFFERENTIAL/PLATELET
Abs Immature Granulocytes: 0.01 10*3/uL (ref 0.00–0.07)
Basophils Absolute: 0 10*3/uL (ref 0.0–0.1)
Basophils Relative: 0 %
Eosinophils Absolute: 0.1 10*3/uL (ref 0.0–0.5)
Eosinophils Relative: 1 %
HCT: 33.5 % — ABNORMAL LOW (ref 36.0–46.0)
Hemoglobin: 11 g/dL — ABNORMAL LOW (ref 12.0–15.0)
Immature Granulocytes: 0 %
Lymphocytes Relative: 35 %
Lymphs Abs: 1.7 10*3/uL (ref 0.7–4.0)
MCH: 29.3 pg (ref 26.0–34.0)
MCHC: 32.8 g/dL (ref 30.0–36.0)
MCV: 89.1 fL (ref 80.0–100.0)
Monocytes Absolute: 0.4 10*3/uL (ref 0.1–1.0)
Monocytes Relative: 7 %
Neutro Abs: 2.8 10*3/uL (ref 1.7–7.7)
Neutrophils Relative %: 57 %
Platelets: 172 10*3/uL (ref 150–400)
RBC: 3.76 MIL/uL — ABNORMAL LOW (ref 3.87–5.11)
RDW: 13.4 % (ref 11.5–15.5)
WBC: 5 10*3/uL (ref 4.0–10.5)
nRBC: 0 % (ref 0.0–0.2)

## 2019-11-02 LAB — BASIC METABOLIC PANEL
Anion gap: 9 (ref 5–15)
BUN: 13 mg/dL (ref 8–23)
CO2: 29 mmol/L (ref 22–32)
Calcium: 8.9 mg/dL (ref 8.9–10.3)
Chloride: 94 mmol/L — ABNORMAL LOW (ref 98–111)
Creatinine, Ser: 0.85 mg/dL (ref 0.44–1.00)
GFR calc non Af Amer: >60 mL/min (ref >60)
Glucose, Bld: 153 mg/dL — ABNORMAL HIGH (ref 70–99)
Potassium: 4.2 mmol/L (ref 3.5–5.1)
Sodium: 132 mmol/L — ABNORMAL LOW (ref 135–145)

## 2019-11-02 LAB — TROPONIN I (HIGH SENSITIVITY): Troponin I (High Sensitivity): 13 ng/L (ref <18)

## 2019-11-02 LAB — RESPIRATORY PANEL BY RT PCR (FLU A&B, COVID)
Influenza A by PCR: NEGATIVE
Influenza B by PCR: NEGATIVE
SARS Coronavirus 2 by RT PCR: NEGATIVE

## 2019-11-02 MED ORDER — NITROGLYCERIN 0.4 MG SL SUBL: 0.4000 mg | SUBLINGUAL_TABLET | SUBLINGUAL | Status: DC | PRN

## 2019-11-02 MED ORDER — ONDANSETRON HCL 4 MG/2ML IJ SOLN: 4.0000 mg | Freq: Four times a day (QID) | INTRAMUSCULAR | Status: DC | PRN

## 2019-11-02 MED ORDER — ACETAMINOPHEN 325 MG PO TABS
650.0000 mg | ORAL_TABLET | ORAL | Status: DC | PRN
  Administered 2019-11-03 – 2019-11-04 (×3): 650 mg via ORAL
  Filled 2019-11-02 (×3): qty 2

## 2019-11-02 MED ORDER — ACETAMINOPHEN 325 MG PO TABS
ORAL_TABLET | ORAL | Status: AC
  Administered 2019-11-03: 650 mg via ORAL
  Filled 2019-11-02: qty 2

## 2019-11-02 MED ORDER — ENOXAPARIN SODIUM 40 MG/0.4ML ~~LOC~~ SOLN
40.0000 mg | SUBCUTANEOUS | Status: DC
  Administered 2019-11-03 (×2): 40 mg via SUBCUTANEOUS
  Filled 2019-11-02 (×2): qty 0.4

## 2019-11-02 NOTE — ED Notes (Signed)
Pt resting with daughter at bedside, no pain at this time

## 2019-11-02 NOTE — Telephone Encounter (Signed)
-----   Message from Abner Greenspan, MD sent at 11/01/2019  8:39 PM EDT ----- Nl blood count, no anemia  Nl lytes and renal function  Cholesterol up mildly  Vit D level is upper range of nl  A1C is up to 7.0 from 6.6 (blood sugar)  Nl thyroid lab  I feel comfortable telling her to increase lisinopril from 10 to 20 mg daily (can double up) and then let me know how bp is at our upcoming phone visit  Please result to her daughter Madaline Savage

## 2019-11-02 NOTE — ED Provider Notes (Signed)
[facility] Emergency Department Provider Note  ____________________________________________  Time seen: Approximately 9:33 PM  I have reviewed the triage vital signs and the nursing notes.   HISTORY  Chief Complaint Chest Pain    HPI Lydia King is a 86 y.o. female with a history of GERD, DVT, hypertension CAD status post MI who reports onset of central chest pain described as pressure radiating to left arm that started at about 10 AM today.  It was 10/10 at onset.  She had been asking her nursing home for help all day and finally got her daughter to call 911 to have her brought to the emergency room.  EMS gave 2 nitroglycerin and 324 mg of aspirin, and pain is improved to 6/10.  Patient denies vomiting but does report shortness of breath and diaphoresis intermittently throughout the day with the pain.  Not pleuritic.  Not exertional.  Feels similar to prior heart attack she had years ago  Past Medical History:  Diagnosis Date  . Anemia    in past (from blood loss at hiatal hernia)  . Asthma   . Charcot-Marie-Tooth disease    walks with cane (fairly controlled)  . Diverticulosis   . DVT (deep venous thrombosis) ([place]) 2003   after sx  . GERD (gastroesophageal reflux disease)   . Grief reaction    on fluoxetine  . Headache(784.0)    migraine  . Hiatal hernia   . Hyperlipidemia   . Hypertension   . MI (myocardial infarction) ([place])   . OA (ocular albinism) ([place])   . Osteopenia   . PUD (peptic ulcer disease)    gastric, past  . Shingles   . Stroke ([place]) 8/10   posterior reversible encephalopathy syndrome  . Urine incontinence      Patient Active Problem List   Diagnosis Date Noted  . CAD (coronary artery disease) 08/01/2019  . Anemia 08/01/2019  . Acute on chronic congestive heart failure ([place]) 07/31/2019  . Hyperlipidemia associated with type 2 diabetes mellitus ([place]) 07/27/2018  . Sinus congestion 02/05/2018  . Vertigo 05/06/2017   . OSA (obstructive sleep apnea) 01/02/2016  . Pedal edema 01/02/2016  . Fall in home 12/11/2015  . Poor balance 12/11/2015  . Low back pain with sciatica 10/02/2015  . Right hip pain 10/02/2015  . Hearing loss 04/18/2015  . Encounter for Medicare annual wellness exam 10/04/2013  . Type 2 diabetes mellitus without complications ([place]) 16/10/9602  . History of CVA (cerebrovascular accident) 09/12/2008  . OSTEOARTHRITIS, MULTIPLE JOINTS 05/30/2008  . Osteoporosis 05/30/2008  . Vitamin D deficiency 07/14/2007  . Sleep apnea 07/14/2007  . Essential hypertension 11/30/2006  . Asthma 11/30/2006  . H/O gastroesophageal reflux (GERD) 11/30/2006  . G I BLEED 11/30/2006  . DEEP VENOUS THROMBOPHLEBITIS, HX OF 11/30/2006     Past Surgical History:  Procedure Laterality Date  . carotid dopplers  8/10  . COLONOSCOPY  2002  . HERNIA REPAIR  2002   bleeding  . HIATAL HERNIA REPAIR  2000   needed blood transfusion  . Stroke  8/10   hosp stroke/ posterior (vs posterior reversible encephalopathy syndrome) - d/c on Plavix  . TEE normal  5/40   no emoblic source  . TOTAL KNEE ARTHROPLASTY  03,06   Bilateral  . VESICOVAGINAL FISTULA CLOSURE W/ TAH  1964   with bleeding and cervical dysplasia     Prior to Admission medications   Medication Sig Start Date End Date Taking? Authorizing Provider  acetaminophen (TYLENOL)  500 MG tablet Take 1,000 mg by mouth every 8 (eight) hours as needed.    [provider]  alum & mag hydroxide-simeth (MAALOX/MYLANTA) 200-200-20 MG/5ML suspension Take 30 mLs by mouth in the morning, at noon, and at bedtime.    [provider]  aspirin EC 81 MG EC tablet Take 1 tablet (81 mg total) by mouth daily. Swallow whole. 08/05/19   Bonnielee Haff, MD  azelastine (ASTELIN) 0.1 % nasal spray Place 1 spray into both nostrils 2 (two) times daily. Use in each nostril as directed 08/26/17   Cuthriell, Charline Bills, PA-C  Blood Glucose Monitoring Suppl (FREESTYLE  LITE) DEVI Use to check blood glucose daily and as needed for diabetes 10/11/17   Tower, Wynelle Fanny, MD  calcium-vitamin D (OSCAL WITH D) 500-200 MG-UNIT tablet Take 1 tablet by mouth daily with breakfast.    [provider]  Cholecalciferol (VITAMIN D) 2000 UNITS CAPS Take 2 capsules by mouth daily.      [provider]  clopidogrel (PLAVIX) 75 MG tablet TAKE 1 TABLET BY MOUTH EVERY DAY 07/26/19   Tower, Wynelle Fanny, MD  cyanocobalamin 1000 MCG tablet Take 1,000 mcg by mouth daily.    [provider]  esomeprazole (NEXIUM) 40 MG capsule Take 1 capsule (40 mg total) by mouth daily before breakfast. 07/27/18   Tower, Wynelle Fanny, MD  famotidine (PEPCID) 20 MG tablet Take 20 mg by mouth at bedtime.    [provider]  ferrous sulfate 325 (65 FE) MG tablet Take 325 mg by mouth daily with breakfast.    [provider]  Flaxseed, Linseed, (FLAX SEED OIL) 1000 MG CAPS Take 1 capsule by mouth daily.      [provider]  FLUoxetine (PROZAC) 20 MG capsule Take 1 capsule (20 mg total) by mouth daily. 07/27/18   Tower, Wynelle Fanny, MD  furosemide (LASIX) 40 MG tablet Take 1 tablet (40 mg total) by mouth daily. Patient taking differently: Take 20 mg by mouth daily.  08/04/19   Bonnielee Haff, MD  glucose blood (FREESTYLE LITE) test strip Use to check blood sugar once daily for DM (dx. N39.767) 11/08/18   Tower, Wynelle Fanny, MD  isosorbide mononitrate (IMDUR) 30 MG 24 hr tablet Take 1 tablet (30 mg total) by mouth daily. 08/26/19 11/24/19  Minna Merritts, MD  Lancets (FREESTYLE) lancets Use as instructedUse to check blood sugar once daily for DM (dx. H41.937) 11/08/18   Tower, Wynelle Fanny, MD  lidocaine (LMX) 4 % cream Apply 1 application topically as needed.    [provider]  lisinopril (ZESTRIL) 10 MG tablet Take 1 tablet (10 mg total) by mouth daily. 08/05/19   Bonnielee Haff, MD  loperamide (IMODIUM) 2 MG capsule Take 2 mg by mouth as needed for diarrhea or loose  stools.    [provider]  loratadine (CLARITIN) 10 MG tablet TAKE 1 TABLET BY MOUTH EVERY DAY 02/28/19   Tower, Wynelle Fanny, MD  melatonin 3 MG TABS tablet Take 3 mg by mouth at bedtime.    [provider]  metFORMIN (GLUCOPHAGE) 1000 MG tablet Take 1 tablet (1,000 mg total) by mouth 2 (two) times daily with a meal. 07/27/18   Tower, Wynelle Fanny, MD  metoprolol succinate (TOPROL-XL) 25 MG 24 hr tablet Take 1 tablet (25 mg total) by mouth daily. 08/05/19   Bonnielee Haff, MD  Misc. Devices (WALKER) MISC Wheeled walker with seat- Pt must use for ambulation-- for use as directed: ambulation dx:  729.5, 780.79, 786.05, 784.0, 715.89.  Patient not taking: Reported on 07/31/2019    [provider]  Multiple Vitamins-Minerals (PRESERVISION AREDS 2+MULTI VIT PO) Take 1 tablet by mouth at bedtime.    [provider]  nitroGLYCERIN (NITROSTAT) 0.4 MG SL tablet Place under the tongue. 06/19/19 06/18/20  [provider]  omega-3 acid ethyl esters (LOVAZA) 1 g capsule TAKE 1 CAPSULE (1 G TOTAL) BY MOUTH DAILY. 07/26/19   Tower, Wynelle Fanny, MD  ondansetron (ZOFRAN) 4 MG tablet Take 4 mg by mouth every 4 (four) hours as needed for nausea or vomiting.    [provider]  polyethylene glycol (MIRALAX / GLYCOLAX) 17 g packet Take 17 g by mouth daily as needed.    [provider]  PROAIR HFA 108 (90 Base) MCG/ACT inhaler INHALE 2 PUFFS INTO THE LUNGS EVERY 4 (FOUR) HOURS AS NEEDED FOR WHEEZING OR SHORTNESS OF BREATH. 03/05/16   Tower, Wynelle Fanny, MD  rosuvastatin (CRESTOR) 10 MG tablet TAKE 1 TABLET BY MOUTH EVERYDAY AT BEDTIME 08/26/19   Gollan, Kathlene November, MD  sennosides-docusate sodium (SENOKOT-S) 8.6-50 MG tablet Take 2 tablets by mouth daily.    [provider]  traMADol (ULTRAM) 50 MG tablet Take 1 tablet (50 mg total) by mouth every 6 (six) hours as needed for moderate pain. 08/04/19   Bonnielee Haff, MD     Allergies Morphine and related, Other, Shrimp [shellfish  allergy], Amlodipine besylate, Atorvastatin, Ciprofloxacin, Influenza vaccines, Nitrofurantoin, Omeprazole, Pantoprazole sodium, Penicillin g, Penicillins, Simvastatin, Sulfonamide derivatives, Tramadol, Codeine, and Prednisone   Family History  Problem Relation Age of Onset  . Cerebral aneurysm Son   . Cerebral aneurysm Father   . Hyperlipidemia Father   . Alcohol abuse Father   . Stroke Mother   . Cervical cancer Sister   . Hyperlipidemia Brother   . Alcohol abuse Brother   . Stroke Brother   . Diabetes Brother   . Diabetes Sister   . Sarcoidosis Sister   . Other Sister        OP/ Broken hip  . Other Other        whole family has charcot marie tooth  . Ovarian cancer Sister     Social History Social History   Tobacco Use  . Smoking status: Never Smoker  . Smokeless tobacco: Never Used  Vaping Use  . Vaping Use: Never used  Substance Use Topics  . Alcohol use: No  . Drug use: No    Review of Systems  Constitutional:   No fever or chills.  ENT:   No sore throat. No rhinorrhea. Cardiovascular:   Positive chest pain as above without syncope. Respiratory:   No dyspnea or cough. Gastrointestinal:   Negative for abdominal pain, vomiting and diarrhea.  Musculoskeletal:   Negative for focal pain or swelling All other systems reviewed and are negative except as documented above in ROS and HPI.  ____________________________________________   PHYSICAL EXAM:  VITAL SIGNS: ED Triage Vitals [11/02/19 2050]  Enc Vitals Group     BP 140/67     Pulse Rate 70     Resp 14     Temp 98.1 F (36.7 C)     Temp Source Oral     SpO2 96 %     Weight 149 lb (67.6 kg)     Height 4\' 11"  (1.499 m)     Head Circumference      Peak Flow      Pain Score 6  Pain Loc      Pain Edu?      Excl. in [place]?     Vital signs reviewed, nursing assessments reviewed.   Constitutional:   Alert and oriented. Non-toxic appearance. Eyes:   Conjunctivae are normal. EOMI. PERRL. ENT       Head:   Normocephalic and atraumatic.      Nose:   Wearing a mask.      Mouth/Throat:   Wearing a mask.      Neck:   No meningismus. Full ROM. Hematological/Lymphatic/Immunilogical:   No cervical lymphadenopathy. Cardiovascular:   RRR. Symmetric bilateral radial and DP pulses.  No murmurs. Cap refill less than 2 seconds. Respiratory:   Normal respiratory effort without tachypnea/retractions. Breath sounds are clear and equal bilaterally. No wheezes/rales/rhonchi. Gastrointestinal:   Soft and nontender. Non distended. There is no CVA tenderness.  No rebound, rigidity, or guarding.  Musculoskeletal:   Normal range of motion in all extremities. No joint effusions.  No lower extremity tenderness.  No edema. Neurologic:   Normal speech and language.  Motor grossly intact. No acute focal neurologic deficits are appreciated.  Skin:    Skin is warm, dry and intact. No rash noted.  No petechiae, purpura, or bullae.  ____________________________________________    LABS (pertinent positives/negatives) (all labs ordered are listed, but only abnormal results are displayed) Labs Reviewed  CBC WITH DIFFERENTIAL/PLATELET - Abnormal; Notable for the following components:      Result Value   RBC 3.76 (*)    Hemoglobin 11.0 (*)    HCT 33.5 (*)    All other components within normal limits  RESPIRATORY PANEL BY RT PCR (FLU A&B, COVID)  BASIC METABOLIC PANEL  TROPONIN I (HIGH SENSITIVITY)   ____________________________________________   EKG  Interpreted by me Normal sinus rhythm rate of 71, normal axis and intervals.  Poor R wave progression.  Normal ST segments and T waves.  No acute ischemic changes.  ____________________________________________    [id number]  DG Chest 2 View  Result Date: 11/02/2019 CLINICAL DATA:  Chest pain. Patient reports pain with deep breathing. EXAM: CHEST - 2 VIEW COMPARISON:  Radiograph 07/31/2019 FINDINGS: Stable heart size. Unchanged mediastinal contours. Large  retrocardiac hiatal hernia. Improving interstitial opacities from prior exam. No acute or focal airspace disease. No pneumothorax. Advanced chronic change of both shoulders. Degenerative change in the spine. IMPRESSION: 1. No acute chest findings. 2. Large hiatal hernia. Electronically Signed   By: Keith Rake M.D.   On: 11/02/2019 20:46    ____________________________________________   PROCEDURES Procedures  ____________________________________________  DIFFERENTIAL DIAGNOSIS   Non-STEMI, GERD, pneumothorax, pneumonia.  Doubt PE or dissection.  CLINICAL IMPRESSION / ASSESSMENT AND PLAN / ED COURSE  Medications ordered in the ED: Medications  nitroGLYCERIN (NITROSTAT) SL tablet 0.4 mg (has no administration in time range)    Pertinent labs & imaging results that were available during my care of the patient were reviewed by me and considered in my medical decision making (see chart for details).  Lydia King was evaluated in Emergency Department on 11/02/2019 for the symptoms described in the history of present illness. She was evaluated in the context of the global COVID-19 pandemic, which necessitated consideration that the patient might be at risk for infection with the SARS-CoV-2 virus that causes COVID-19. Institutional protocols and algorithms that pertain to the evaluation of patients at risk for COVID-19 are in a state of rapid change based on information released by regulatory bodies including the CDC and federal  and state organizations. These policies and algorithms were followed during the patient's care in the ED.   Patient presents with precordial pain that started about 10 AM today, constant all day.  Somewhat responsive to nitroglycerin given by EMS.  We will continue nitroglycerin as tolerated by blood pressure, obtain labs chest x-ray and Covid screening, and plan for hospitalization for further cardiac work-up.  Initial EKG is  nonischemic.   ----------------------------------------- 10:19 PM on 11/02/2019 -----------------------------------------  Pain still 5/10.  Vital signs unremarkable currently.  Reviewed chart and [facility] notes from care everywhere, noting that patient was admitted there in May 2021 with similar symptoms, had CT angiogram of chest which was negative for PE or dissection.  Also had a reported anaphylactic reaction to the IV contrast.  Cardiac catheterization done at that time showed severe three-vessel disease as well as an ostial RCA lesion that was considered high risk for intervention.  Case discussed with the hospitalist for further evaluation.  Will obtain noncontrast CT scan of the chest to assess her intrathoracic stomach and ensure it does not look excessively torsed or ischemic .     ____________________________________________   FINAL CLINICAL IMPRESSION(S) / ED DIAGNOSES    Final diagnoses:  Chest pain with moderate risk for cardiac etiology     ED Discharge Orders    None      Portions of this note were generated with dragon dictation software. Dictation errors may occur despite best attempts at proofreading.   Carrie Mew, MD 11/02/19 2220

## 2019-11-02 NOTE — ED Triage Notes (Signed)
Pt to ED via EMS from [facility] c/o chest pain starting at 10am to left chest throbbing radiating to back and to left arm that was 10/10 pain.  Pt has hx of NSTEMI several years ago and cath first of this year.  Pt given 324 ASA and 2 SL nitro en route with relief, pain down to 6/10.  Pt placed on 2L [place] to 96%, not normally on.

## 2019-11-02 NOTE — Telephone Encounter (Signed)
   Pt c/o of Chest Pain: STAT if CP now or developed within 24 hours  1. Are you having CP right now? No, took tylenol which helped ease pain  2. Are you experiencing any other symptoms (ex. SOB, nausea, vomiting, sweating)? High bp 188/77, slight nausea   3. How long have you been experiencing CP? Starting this morning   4. Is your CP continuous or coming and going? continuous   5. Have you taken Nitroglycerin? 3 this morning ?

## 2019-11-02 NOTE — Telephone Encounter (Signed)
I spoke with the patient's daughter, Madaline Savage (ok per DPR).  She was calling with concerns about the patient's blood pressure running high over the last 3 weeks.  The patient did develop chest pain this morning as well after getting back from her shower. She lives at [facility] and has to go down the hall from her room to the shower room. When she got back from her shower, she laid in her bed and started to have the chest pain. She was given 3 NTG tablets to help with her symptoms. Madaline Savage has some BP readings from the healthcare facility, but the patient reported to her that it was 225/101 at some point (though not recorded on the healthcare sheets she has) & BP today was 188/77.  I asked Madaline Savage if the patient had had her morning meds prior to her shower this morning and she was unsure.   I advised the patient may have some intermittent chest pain due to her cardiac history, but I am uncertain if the symptoms she had today were related to her elevated BP vs the blood flow to her heart.   The patient has been seeing Dr. Glori Bickers as well for her BP. Last appointment was 10/31/19. The patient had lab testing done.  I advised Madaline Savage that per the lab reports from Dr. Glori Bickers, they were trying to reach her to discuss increasing the patient's lisinopril from 10 mg>> 20 mg once daily.   Madaline Savage wanted to make sure Dr. Rockey Situ was ok with this and had no other recommendations at this time.  I advised I will review with Dr. Rockey Situ and we will call her back with further recommendations.   Madaline Savage voices understanding and is agreeable.

## 2019-11-02 NOTE — Telephone Encounter (Signed)
Patient daughter calling to add :  Patient has pain with deep breath , chest wall is sore , and she feels like before when she had a MI

## 2019-11-02 NOTE — Telephone Encounter (Signed)
Lydia King returned your call.  Pt has taken 3 Nitros this morning and b/p is running high.  Lydia King is calling cardiology.  I offered to xfer to nurse but she preferred calling cardiologist.

## 2019-11-02 NOTE — Telephone Encounter (Signed)
Call went to cardiology- taken by triage  I had suggested increasing her ace  Let me know if Madaline Savage wants to do this  Thanks

## 2019-11-02 NOTE — Telephone Encounter (Signed)
Left VM requesting pt's daughter to call the office back 

## 2019-11-03 ENCOUNTER — Encounter: Payer: Self-pay | Admitting: Family Medicine

## 2019-11-03 DIAGNOSIS — E119 Type 2 diabetes mellitus without complications: Secondary | ICD-10-CM | POA: Diagnosis present

## 2019-11-03 DIAGNOSIS — M8580 Other specified disorders of bone density and structure, unspecified site: Secondary | ICD-10-CM | POA: Diagnosis present

## 2019-11-03 DIAGNOSIS — R079 Chest pain, unspecified: Secondary | ICD-10-CM | POA: Diagnosis not present

## 2019-11-03 DIAGNOSIS — I5032 Chronic diastolic (congestive) heart failure: Secondary | ICD-10-CM

## 2019-11-03 DIAGNOSIS — I10 Essential (primary) hypertension: Secondary | ICD-10-CM | POA: Diagnosis not present

## 2019-11-03 DIAGNOSIS — Z66 Do not resuscitate: Secondary | ICD-10-CM | POA: Diagnosis present

## 2019-11-03 DIAGNOSIS — R197 Diarrhea, unspecified: Secondary | ICD-10-CM | POA: Diagnosis present

## 2019-11-03 DIAGNOSIS — Z8673 Personal history of transient ischemic attack (TIA), and cerebral infarction without residual deficits: Secondary | ICD-10-CM | POA: Diagnosis not present

## 2019-11-03 DIAGNOSIS — Z955 Presence of coronary angioplasty implant and graft: Secondary | ICD-10-CM | POA: Diagnosis not present

## 2019-11-03 DIAGNOSIS — Z887 Allergy status to serum and vaccine status: Secondary | ICD-10-CM | POA: Diagnosis not present

## 2019-11-03 DIAGNOSIS — F419 Anxiety disorder, unspecified: Secondary | ICD-10-CM | POA: Diagnosis not present

## 2019-11-03 DIAGNOSIS — I25118 Atherosclerotic heart disease of native coronary artery with other forms of angina pectoris: Principal | ICD-10-CM

## 2019-11-03 DIAGNOSIS — Z86718 Personal history of other venous thrombosis and embolism: Secondary | ICD-10-CM | POA: Diagnosis not present

## 2019-11-03 DIAGNOSIS — K219 Gastro-esophageal reflux disease without esophagitis: Secondary | ICD-10-CM | POA: Diagnosis present

## 2019-11-03 DIAGNOSIS — R32 Unspecified urinary incontinence: Secondary | ICD-10-CM | POA: Diagnosis present

## 2019-11-03 DIAGNOSIS — J45909 Unspecified asthma, uncomplicated: Secondary | ICD-10-CM | POA: Diagnosis present

## 2019-11-03 DIAGNOSIS — G4733 Obstructive sleep apnea (adult) (pediatric): Secondary | ICD-10-CM

## 2019-11-03 DIAGNOSIS — E785 Hyperlipidemia, unspecified: Secondary | ICD-10-CM | POA: Diagnosis present

## 2019-11-03 DIAGNOSIS — I200 Unstable angina: Secondary | ICD-10-CM | POA: Diagnosis present

## 2019-11-03 DIAGNOSIS — I5042 Chronic combined systolic (congestive) and diastolic (congestive) heart failure: Secondary | ICD-10-CM | POA: Diagnosis present

## 2019-11-03 DIAGNOSIS — K449 Diaphragmatic hernia without obstruction or gangrene: Secondary | ICD-10-CM | POA: Diagnosis present

## 2019-11-03 DIAGNOSIS — I350 Nonrheumatic aortic (valve) stenosis: Secondary | ICD-10-CM | POA: Diagnosis present

## 2019-11-03 DIAGNOSIS — I252 Old myocardial infarction: Secondary | ICD-10-CM | POA: Diagnosis not present

## 2019-11-03 DIAGNOSIS — G43909 Migraine, unspecified, not intractable, without status migrainosus: Secondary | ICD-10-CM | POA: Diagnosis present

## 2019-11-03 DIAGNOSIS — Z20822 Contact with and (suspected) exposure to covid-19: Secondary | ICD-10-CM | POA: Diagnosis present

## 2019-11-03 DIAGNOSIS — Z8711 Personal history of peptic ulcer disease: Secondary | ICD-10-CM | POA: Diagnosis not present

## 2019-11-03 DIAGNOSIS — I110 Hypertensive heart disease with heart failure: Secondary | ICD-10-CM | POA: Diagnosis present

## 2019-11-03 DIAGNOSIS — Z882 Allergy status to sulfonamides status: Secondary | ICD-10-CM | POA: Diagnosis not present

## 2019-11-03 DIAGNOSIS — T383X5A Adverse effect of insulin and oral hypoglycemic [antidiabetic] drugs, initial encounter: Secondary | ICD-10-CM | POA: Diagnosis present

## 2019-11-03 LAB — GLUCOSE, CAPILLARY
Glucose-Capillary: 114 mg/dL — ABNORMAL HIGH (ref 70–99)
Glucose-Capillary: 109 mg/dL — ABNORMAL HIGH (ref 70–99)
Glucose-Capillary: 109 mg/dL — ABNORMAL HIGH (ref 70–99)
Glucose-Capillary: 124 mg/dL — ABNORMAL HIGH (ref 70–99)
Glucose-Capillary: 160 mg/dL — ABNORMAL HIGH (ref 70–99)

## 2019-11-03 LAB — TROPONIN I (HIGH SENSITIVITY)
Troponin I (High Sensitivity): 15 ng/L (ref <18)
Troponin I (High Sensitivity): 7 ng/L (ref <18)

## 2019-11-03 MED ORDER — METOPROLOL SUCCINATE ER 50 MG PO TB24
25.0000 mg | ORAL_TABLET | Freq: Every day | ORAL | Status: DC
  Administered 2019-11-03: 25 mg via ORAL
  Filled 2019-11-03: qty 1

## 2019-11-03 MED ORDER — PANTOPRAZOLE SODIUM 40 MG PO TBEC
80.0000 mg | DELAYED_RELEASE_TABLET | Freq: Every day | ORAL | Status: AC
  Administered 2019-11-03 – 2019-11-04 (×2): 80 mg via ORAL
  Filled 2019-11-03 (×2): qty 2

## 2019-11-03 MED ORDER — LISINOPRIL 20 MG PO TABS
20.0000 mg | ORAL_TABLET | Freq: Every day | ORAL | Status: DC
  Administered 2019-11-04: 20 mg via ORAL
  Filled 2019-11-03: qty 1

## 2019-11-03 MED ORDER — INSULIN ASPART 100 UNIT/ML ~~LOC~~ SOLN: 0.0000 [IU] | Freq: Every day | SUBCUTANEOUS | Status: DC

## 2019-11-03 MED ORDER — ISOSORBIDE MONONITRATE ER 30 MG PO TB24
30.0000 mg | ORAL_TABLET | Freq: Every day | ORAL | Status: DC
  Administered 2019-11-03: 30 mg via ORAL
  Filled 2019-11-03: qty 1

## 2019-11-03 MED ORDER — SENNOSIDES-DOCUSATE SODIUM 8.6-50 MG PO TABS
2.0000 | ORAL_TABLET | Freq: Every day | ORAL | Status: DC
  Administered 2019-11-03 – 2019-11-04 (×2): 2 via ORAL
  Filled 2019-11-03 (×2): qty 2

## 2019-11-03 MED ORDER — NON FORMULARY: 40.0000 | Freq: Every day | Status: DC

## 2019-11-03 MED ORDER — INSULIN ASPART 100 UNIT/ML ~~LOC~~ SOLN
0.0000 [IU] | Freq: Three times a day (TID) | SUBCUTANEOUS | Status: DC
  Administered 2019-11-03 – 2019-11-04 (×2): 1 [IU] via SUBCUTANEOUS
  Administered 2019-11-04: 2 [IU] via SUBCUTANEOUS
  Filled 2019-11-03 (×3): qty 1

## 2019-11-03 MED ORDER — VITAMIN B-12 1000 MCG PO TABS
1000.0000 ug | ORAL_TABLET | Freq: Every day | ORAL | Status: DC
  Administered 2019-11-03 – 2019-11-04 (×2): 1000 ug via ORAL
  Filled 2019-11-03 (×2): qty 1

## 2019-11-03 MED ORDER — CALCIUM CARBONATE-VITAMIN D 500-200 MG-UNIT PO TABS
1.0000 | ORAL_TABLET | Freq: Every day | ORAL | Status: DC
  Administered 2019-11-03 – 2019-11-04 (×2): 1 via ORAL
  Filled 2019-11-03 (×2): qty 1

## 2019-11-03 MED ORDER — ASPIRIN EC 81 MG PO TBEC
81.0000 mg | DELAYED_RELEASE_TABLET | Freq: Every day | ORAL | Status: DC
  Administered 2019-11-03 – 2019-11-04 (×2): 81 mg via ORAL
  Filled 2019-11-03 (×2): qty 1

## 2019-11-03 MED ORDER — FLUOXETINE HCL 20 MG PO CAPS
20.0000 mg | ORAL_CAPSULE | Freq: Every day | ORAL | Status: DC
  Administered 2019-11-03 – 2019-11-04 (×2): 20 mg via ORAL
  Filled 2019-11-03 (×2): qty 1

## 2019-11-03 MED ORDER — VITAMIN D3 25 MCG (1000 UNIT) PO TABS
2000.0000 [IU] | ORAL_TABLET | Freq: Every day | ORAL | Status: DC
  Administered 2019-11-03 – 2019-11-04 (×2): 2000 [IU] via ORAL
  Filled 2019-11-03 (×4): qty 2

## 2019-11-03 MED ORDER — CARVEDILOL 6.25 MG PO TABS
6.2500 mg | ORAL_TABLET | Freq: Two times a day (BID) | ORAL | Status: DC
  Administered 2019-11-03 – 2019-11-04 (×3): 6.25 mg via ORAL
  Filled 2019-11-03 (×3): qty 1

## 2019-11-03 MED ORDER — CLOPIDOGREL BISULFATE 75 MG PO TABS
75.0000 mg | ORAL_TABLET | Freq: Every day | ORAL | Status: DC
  Administered 2019-11-03 (×2): 75 mg via ORAL
  Filled 2019-11-03 (×2): qty 1

## 2019-11-03 MED ORDER — ROSUVASTATIN CALCIUM 10 MG PO TABS
20.0000 mg | ORAL_TABLET | Freq: Every day | ORAL | Status: DC
  Administered 2019-11-03: 20 mg via ORAL
  Filled 2019-11-03: qty 2
  Filled 2019-11-03: qty 1

## 2019-11-03 MED ORDER — OMEGA-3-ACID ETHYL ESTERS 1 G PO CAPS
1.0000 g | ORAL_CAPSULE | Freq: Two times a day (BID) | ORAL | Status: DC
  Administered 2019-11-03 – 2019-11-04 (×4): 1 g via ORAL
  Filled 2019-11-03 (×5): qty 1

## 2019-11-03 MED ORDER — FAMOTIDINE 20 MG PO TABS
20.0000 mg | ORAL_TABLET | Freq: Every day | ORAL | Status: DC
  Administered 2019-11-03: 20 mg via ORAL
  Filled 2019-11-03: qty 1

## 2019-11-03 MED ORDER — LISINOPRIL 10 MG PO TABS
10.0000 mg | ORAL_TABLET | Freq: Every day | ORAL | Status: DC
  Administered 2019-11-03: 10 mg via ORAL
  Filled 2019-11-03: qty 1

## 2019-11-03 MED ORDER — MELATONIN 5 MG PO TABS
5.0000 mg | ORAL_TABLET | Freq: Every day | ORAL | Status: DC
  Administered 2019-11-03 (×2): 5 mg via ORAL
  Filled 2019-11-03 (×3): qty 1

## 2019-11-03 MED ORDER — ROSUVASTATIN CALCIUM 10 MG PO TABS
10.0000 mg | ORAL_TABLET | Freq: Every day | ORAL | Status: DC
  Filled 2019-11-03: qty 1

## 2019-11-03 NOTE — Telephone Encounter (Signed)
Spoke with Manuela Schwartz at [facility] and requested medication list and MARs for this patient. Provided her with our fax number and she will send this information over.

## 2019-11-03 NOTE — Telephone Encounter (Signed)
Patient currently admitted since the time of this phone call.

## 2019-11-03 NOTE — Telephone Encounter (Signed)
FYI pt is hospital

## 2019-11-03 NOTE — Progress Notes (Signed)
Patient ordered nexium 40mg  daily x 2 days for symptamatic paraesophageal hernia, chest pain. This is a non formulary item. Per "allergy list", protonix doesn't work. We will start with this since it could be up to 72 hours before we could get Nexium.

## 2019-11-03 NOTE — H&P (Signed)
History and Physical    Lydia King VZC:[id number] DOB: 08/06/1930 DOA: 11/02/2019  PCP: Abner Greenspan, MD  Patient coming from: [place] [facility], daughter bedside  I have personally briefly reviewed patient's old medical records in [place]  Chief Complaint: Chest pain  HPI: Lydia King is a 86 y.o. female with medical history significant for CAD with three-vessel coronary disease on catheterization May 2021 but deemed poor candidate for CABG, stent placed to LAD and left circumflex, moderate AS, hypertension, HFpEF ,Type 2 diabetes. OSA on CPAP who presents with concerns of chest pain.  Patient states that today about 10:30 AM she began to feel dull constant chest squeezing pain with radiation to her left neck and left upper extremity.  Intensity of pain comes and goes.  At one point it was 8 out of 10 but now currently in the ED it only worsens when she takes a deep breath.  At her living facility she was also having associated shortness of breath and lightheadedness with ambulation which she no longer has here in the ED when she was ambulating to the bathroom.  She also had an episode of nausea this afternoon but no vomiting.  Also reports that she has been noticing hypertension in the morning for the past 3 weeks that would improve following her blood pressure medication.  Fasting systolic as high as 128.  May 2021-hospitalized at [facility] for chest pain and had notable at 3 vessel disease and PCI with stenting to LAD and ostial circumflex  June 2021-admitted at [facility] for acute on chronic systolic heart failure  July 2021-admitted at [facility] for acute on chronic systolic heart failure.  At that time she also had chest pain thought to be concerning for angina by cardiology and had adjustment in her medication.  However cardiology did mention that she had continue chest pain, they would have to readdress the need for any catheterization.   ED Course: She was  afebrile, bradycardic at times down to 58 and was placed on 2 L without any documented hypoxia. Troponin of 13 and 15. CBC showed no leukocytosis.  BMP overall unremarkable.  Review of Systems: Constitutional: No Weight Change, No Fever ENT/Mouth: No sore throat, No Rhinorrhea Eyes: No Eye Pain, No Vision Changes Cardiovascular: + Chest Pain, + SOB, No PND, +Dyspnea on Exertion, No Orthopnea, No Edema, No Palpitations Respiratory: No Cough, No Sputum, No Wheezing Gastrointestinal: + Nausea, No Vomiting, No Diarrhea, No Constipation, No Pain Genitourinary: no Urinary Incontinence Musculoskeletal: No Arthralgias, No Myalgias Skin: No Skin Lesions, No Pruritus, Neuro: no Weakness, No Numbness Psych: No Anxiety/Panic, No Depression, no decrease appetite Heme/Lymph: No Bruising, No Bleeding  Past Medical History:  Diagnosis Date  . Anemia    in past (from blood loss at hiatal hernia)  . Asthma   . Charcot-Marie-Tooth disease    walks with cane (fairly controlled)  . Diverticulosis   . DVT (deep venous thrombosis) ([place]) 2003   after sx  . GERD (gastroesophageal reflux disease)   . Grief reaction    on fluoxetine  . Headache(784.0)    migraine  . Hiatal hernia   . Hyperlipidemia   . Hypertension   . MI (myocardial infarction) ([place])   . OA (ocular albinism) ([place])   . Osteopenia   . PUD (peptic ulcer disease)    gastric, past  . Shingles   . Stroke ([place]) 8/10   posterior reversible encephalopathy syndrome  . Urine incontinence  Past Surgical History:  Procedure Laterality Date  . carotid dopplers  8/10  . COLONOSCOPY  2002  . HERNIA REPAIR  2002   bleeding  . HIATAL HERNIA REPAIR  2000   needed blood transfusion  . Stroke  8/10   hosp stroke/ posterior (vs posterior reversible encephalopathy syndrome) - d/c on Plavix  . TEE normal  0/73   no emoblic source  . TOTAL KNEE ARTHROPLASTY  03,06   Bilateral  . VESICOVAGINAL FISTULA CLOSURE W/ TAH  1964   with  bleeding and cervical dysplasia     reports that she has never smoked. She has never used smokeless tobacco. She reports that she does not drink alcohol and does not use drugs. Social History  Allergies  Allergen Reactions  . Morphine And Related Anaphylaxis  . Other Shortness Of Breath and Other (See Comments)  . Shrimp [Shellfish Allergy] Anaphylaxis    Anaphylaxis-swelling of throat/sob    . Amlodipine Besylate     REACTION: edema  . Atorvastatin     REACTION: pain- could not walk  . Ciprofloxacin Other (See Comments)    REACTION: unkown Trouble breathing  . Influenza Vaccines Other (See Comments)    Arm swelling, redness, chills, fever  . Iodinated Diagnostic Agents   . Nitrofurantoin   . Omeprazole     REACTION: not effective  . Pantoprazole Sodium     REACTION: does not work well  . Penicillin G Hives    ITCHY RASH  . Penicillins     REACTION: itchy rash  . Simvastatin     REACTION: leg pain  . Sulfonamide Derivatives     REACTION: itchy rash  . Tramadol Other (See Comments)    Took for years, but then told this was potential cause of prior strokes and she should not ever take again  . Codeine Rash    REACTION: itchy rash ITCHY RASH, also breathing trouble  . Prednisone Other (See Comments)    REACTION: RASH.// SHORTNESS OF BREATH Prior possible flushing reaction in face, but tolerated it in 2020 and 2021 up to 20mg     Family History  Problem Relation Age of Onset  . Cerebral aneurysm Son   . Cerebral aneurysm Father   . Hyperlipidemia Father   . Alcohol abuse Father   . Stroke Mother   . Cervical cancer Sister   . Hyperlipidemia Brother   . Alcohol abuse Brother   . Stroke Brother   . Diabetes Brother   . Diabetes Sister   . Sarcoidosis Sister   . Other Sister        OP/ Broken hip  . Other Other        whole family has charcot marie tooth  . Ovarian cancer Sister      Prior to Admission medications   Medication Sig Start Date End Date  Taking? Authorizing Provider  acetaminophen (TYLENOL) 500 MG tablet Take 1,000 mg by mouth every 8 (eight) hours as needed.   Yes [provider]  alum & mag hydroxide-simeth (MAALOX/MYLANTA) 200-200-20 MG/5ML suspension Take 30 mLs by mouth in the morning, at noon, and at bedtime.   Yes [provider]  aspirin EC 81 MG EC tablet Take 1 tablet (81 mg total) by mouth daily. Swallow whole. 08/05/19  Yes Bonnielee Haff, MD  azelastine (ASTELIN) 0.1 % nasal spray Place 1 spray into both nostrils 2 (two) times daily. Use in each nostril as directed 08/26/17  Yes Cuthriell, Charline Bills, PA-C  Calcium  Carbonate-Vitamin D 600-400 MG-UNIT tablet Take 1 tablet by mouth daily with breakfast.    Yes [provider]  Cholecalciferol (VITAMIN D) 2000 UNITS CAPS Take 2 capsules by mouth daily.     Yes [provider]  cloNIDine (CATAPRES) 0.1 MG tablet Take 0.1 mg by mouth 3 (three) times daily as needed.   Yes [provider]  clopidogrel (PLAVIX) 75 MG tablet TAKE 1 TABLET BY MOUTH EVERY DAY Patient taking differently: Take 75 mg by mouth at bedtime.  07/26/19  Yes Tower, Wynelle Fanny, MD  cyanocobalamin 1000 MCG tablet Take 1,000 mcg by mouth daily.   Yes [provider]  esomeprazole (NEXIUM) 40 MG capsule Take 1 capsule (40 mg total) by mouth daily before breakfast. 07/27/18  Yes Tower, Wynelle Fanny, MD  famotidine (PEPCID) 20 MG tablet Take 20 mg by mouth at bedtime.   Yes [provider]  Flaxseed, Linseed, (FLAX SEED OIL) 1000 MG CAPS Take 1 capsule by mouth daily.     Yes [provider]  FLUoxetine (PROZAC) 20 MG capsule Take 1 capsule (20 mg total) by mouth daily. 07/27/18  Yes Tower, Wynelle Fanny, MD  furosemide (LASIX) 40 MG tablet Take 1 tablet (40 mg total) by mouth daily. Patient taking differently: Take 20 mg by mouth daily as needed for fluid or edema.  08/04/19  Yes Bonnielee Haff, MD  isosorbide mononitrate (IMDUR) 30 MG 24 hr tablet Take 1  tablet (30 mg total) by mouth daily. 08/26/19 11/24/19 Yes Gollan, Kathlene November, MD  lidocaine (LMX) 4 % cream Apply 1 application topically as needed.   Yes [provider]  Lidocaine 4 % PTCH Apply 1 patch topically See admin instructions. Apply to lower back every morning for lower back pain and remove per schedule.   Yes [provider]  lisinopril (ZESTRIL) 10 MG tablet Take 1 tablet (10 mg total) by mouth daily. 08/05/19  Yes Bonnielee Haff, MD  loperamide (IMODIUM) 2 MG capsule Take 2 mg by mouth as needed for diarrhea or loose stools.   Yes [provider]  loratadine (CLARITIN) 10 MG tablet TAKE 1 TABLET BY MOUTH EVERY DAY 02/28/19  Yes Tower, Marne A, MD  melatonin 3 MG TABS tablet Take 3 mg by mouth at bedtime.   Yes [provider]  metFORMIN (GLUCOPHAGE) 1000 MG tablet Take 1 tablet (1,000 mg total) by mouth 2 (two) times daily with a meal. 07/27/18  Yes Tower, Wynelle Fanny, MD  metoprolol succinate (TOPROL-XL) 25 MG 24 hr tablet Take 1 tablet (25 mg total) by mouth daily. Patient taking differently: Take 25 mg by mouth at bedtime.  08/05/19  Yes Bonnielee Haff, MD  Multiple Vitamins-Minerals (PRESERVISION AREDS 2+MULTI VIT PO) Take 1 tablet by mouth at bedtime.   Yes [provider]  nitroGLYCERIN (NITROSTAT) 0.4 MG SL tablet Place under the tongue. 06/19/19 06/18/20 Yes [provider]  omega-3 acid ethyl esters (LOVAZA) 1 g capsule TAKE 1 CAPSULE (1 G TOTAL) BY MOUTH DAILY. Patient taking differently: Take 1 g by mouth 2 (two) times daily.  07/26/19  Yes Tower, Wynelle Fanny, MD  ondansetron (ZOFRAN) 4 MG tablet Take 4 mg by mouth every 4 (four) hours as needed for nausea or vomiting.   Yes [provider]  polyethylene glycol (MIRALAX / GLYCOLAX) 17 g packet Take 17 g by mouth daily as needed.   Yes [provider]  PROAIR HFA 108 (90 Base) MCG/ACT inhaler INHALE 2 PUFFS INTO THE LUNGS  EVERY 4 (FOUR) HOURS AS NEEDED FOR WHEEZING OR  SHORTNESS OF BREATH. 03/05/16  Yes Tower, Marne A, MD  rosuvastatin (CRESTOR) 10 MG tablet TAKE 1 TABLET BY MOUTH EVERYDAY AT BEDTIME 08/26/19  Yes Gollan, Kathlene November, MD  sennosides-docusate sodium (SENOKOT-S) 8.6-50 MG tablet Take 2 tablets by mouth daily.   Yes [provider]  traMADol (ULTRAM) 50 MG tablet Take 1 tablet (50 mg total) by mouth every 6 (six) hours as needed for moderate pain. 08/04/19  Yes Bonnielee Haff, MD  Blood Glucose Monitoring Suppl (FREESTYLE LITE) DEVI Use to check blood glucose daily and as needed for diabetes 10/11/17   Tower, Roque Lias A, MD  glucose blood (FREESTYLE LITE) test strip Use to check blood sugar once daily for DM (dx. J67.341) 11/08/18   Tower, Wynelle Fanny, MD  Lancets (FREESTYLE) lancets Use as instructedUse to check blood sugar once daily for DM (dx. E11.618) 11/08/18   Abner Greenspan, MD    Physical Exam: Vitals:   11/02/19 2050 11/02/19 2300 11/03/19 0000  BP: 140/67 (!) 144/59 (!) 148/55  Pulse: 70 (!) 58 (!) 58  Resp: 14    Temp: 98.1 F (36.7 C)    TempSrc: Oral    SpO2: 96% 96% 95%  Weight: 67.6 kg    Height: 4\' 11"  (1.499 m)      Constitutional: NAD, calm, comfortable, elderly female laying flat in bed Vitals:   11/02/19 2050 11/02/19 2300 11/03/19 0000  BP: 140/67 (!) 144/59 (!) 148/55  Pulse: 70 (!) 58 (!) 58  Resp: 14    Temp: 98.1 F (36.7 C)    TempSrc: Oral    SpO2: 96% 96% 95%  Weight: 67.6 kg    Height: 4\' 11"  (1.499 m)     Eyes: PERRL, lids and conjunctivae normal ENMT: Mucous membranes are moist. Neck: normal, supple,  Respiratory: clear to auscultation bilaterally, no wheezing, no crackles. Normal respiratory effort and was placed on 2 L via nasal cannula without documented hypoxia. No accessory muscle use.  Cardiovascular: Regular rate and rhythm, no murmurs / rubs / gallops. No extremity edema. 2+ pedal pulses.  Mild reproducible chest wall pain with palpation. Abdomen: no tenderness, no masses palpated. Bowel  sounds positive.  Musculoskeletal: no clubbing / cyanosis. No joint deformity upper and lower extremities. Good ROM, no contractures. Normal muscle tone.  Skin: no rashes, lesions, ulcers. No induration Neurologic: CN 2-12 grossly intact. Sensation intact,  Strength 5/5 in all 4.  Psychiatric: Normal judgment and insight. Alert and oriented x 3. Normal mood.     Labs on Admission: I have personally reviewed following labs and imaging studies  CBC: Recent Labs  Lab 10/31/19 1637 11/02/19 2102  WBC 6.8 5.0  NEUTROABS 4.6 2.8  HGB 12.7 11.0*  HCT 38.7 33.5*  MCV 88.0 89.1  PLT 211.0 937   Basic Metabolic Panel: Recent Labs  Lab 10/31/19 1637 11/02/19 2102  NA 137 132*  K 4.1 4.2  CL 97 94*  CO2 30 29  GLUCOSE 125* 153*  BUN 13 13  CREATININE 0.71 0.85  CALCIUM 9.9 8.9   GFR: Estimated Creatinine Clearance: 39 mL/min (by C-G formula based on SCr of 0.85 mg/dL). Liver Function Tests: Recent Labs  Lab 10/31/19 1637  AST 12  ALT 11  ALKPHOS 72  BILITOT 0.4  PROT 6.4  ALBUMIN 4.0   No results for input(s): LIPASE, AMYLASE in the last 168 hours. No results for input(s): AMMONIA in the last 168 hours. Coagulation  Profile: No results for input(s): INR, PROTIME in the last 168 hours. Cardiac Enzymes: No results for input(s): CKTOTAL, CKMB, CKMBINDEX, TROPONINI in the last 168 hours. BNP (last 3 results) No results for input(s): PROBNP in the last 8760 hours. HbA1C: Recent Labs    10/31/19 1637  HGBA1C 7.0*   CBG: No results for input(s): GLUCAP in the last 168 hours. Lipid Profile: Recent Labs    10/31/19 1637  CHOL 179  HDL 57.60  LDLCALC 85  TRIG 180.0*  CHOLHDL 3   Thyroid Function Tests: Recent Labs    10/31/19 1637  TSH 1.45   Anemia Panel: No results for input(s): VITAMINB12, FOLATE, FERRITIN, TIBC, IRON, RETICCTPCT in the last 72 hours. Urine analysis:    Component Value Date/Time   COLORURINE YELLOW 09/30/2008 0236   APPEARANCEUR  CLOUDY (A) 09/30/2008 0236   LABSPEC 1.011 09/30/2008 0236   PHURINE 6.5 09/30/2008 0236   GLUCOSEU NEGATIVE 09/30/2008 0236   HGBUR NEGATIVE 09/30/2008 0236   BILIRUBINUR Negative 10/02/2015 1032   KETONESUR NEGATIVE 09/30/2008 0236   PROTEINUR Negative 10/02/2015 1032   PROTEINUR NEGATIVE 09/30/2008 0236   UROBILINOGEN 0.2 10/02/2015 1032   UROBILINOGEN 0.2 09/30/2008 0236   NITRITE Negative 10/02/2015 1032   NITRITE NEGATIVE 09/30/2008 0236   LEUKOCYTESUR small (1+) (A) 10/02/2015 1032    Radiological Exams on Admission: DG Chest 2 View  Result Date: 11/02/2019 CLINICAL DATA:  Chest pain. Patient reports pain with deep breathing. EXAM: CHEST - 2 VIEW COMPARISON:  Radiograph 07/31/2019 FINDINGS: Stable heart size. Unchanged mediastinal contours. Large retrocardiac hiatal hernia. Improving interstitial opacities from prior exam. No acute or focal airspace disease. No pneumothorax. Advanced chronic change of both shoulders. Degenerative change in the spine. IMPRESSION: 1. No acute chest findings. 2. Large hiatal hernia. Electronically Signed   By: Keith Rake M.D.   On: 11/02/2019 20:46   CT Chest Wo Contrast  Result Date: 11/02/2019 CLINICAL DATA:  Chest pain shortness of breath EXAM: CT CHEST WITHOUT CONTRAST TECHNIQUE: Multidetector CT imaging of the chest was performed following the standard protocol without IV contrast. COMPARISON:  Radiograph same day FINDINGS: Cardiovascular: There is mild cardiomegaly. Coronary artery calcifications as well as aortic valve calcifications are noted. Mild scattered aortic atherosclerosis is seen. There is no pericardial thickening or effusion. Mediastinum/Nodes: There are no enlarged mediastinal, hilar or axillary lymph nodes.There is a large paraesophageal hernia which contains a portion of the gastric fundus and mid body above the level of the diaphragm. There is a small amount of refluxed debris seen within the distal esophagus. The thyroid  gland, trachea are unremarkable. Lungs/Pleura: Rounded patchy airspace opacity at the posterior right lung base, likely atelectasis or scarring. No suspicious pulmonary nodules. There is no pleural effusion. Upper abdomen: The visualized portion of the upper abdomen is unremarkable. Musculoskeletal/Chest wall: There is no chest wall mass or suspicious osseous finding. No acute osseous abnormality advanced bilateral shoulder osteoarthritis is seen. Slight anterior wedge compression deformities are seen in midthoracic spine with endplate sclerosis and kyphosis. IMPRESSION: Large paraesophageal hernia, likely type 3, which contains a portion of the gastric fundus and body above the level of the diaphragm. Small amount of gastroesophageal reflux Aortic Atherosclerosis (ICD10-I70.0). Electronically Signed   By: Prudencio Pair M.D.   On: 11/02/2019 22:46      Assessment/Plan  Chest pain  ?  Demand ischemia from hypotension.  Patient is normotensive here but mentions that she has been noticing hypotension with systolic in the 937T in the  past 3 weeks especially in the morning.  Will follow blood pressure closely while inpatient. Continue to trend troponin.  Initial troponin of 13 and 15.  EKG shows normal sinus rhythm on my review.  Continue to trend troponin Consult cardiology in the morning to discuss likely needs to change her medication regimen and readdress for any needs of heart cath. PRN chest pain for recurrent chest pain PRN nitro SL for pain keep on telemetry  continue Imdur, lisinopril, metoprolol  HFpEF Appears euvolemic Echocardiogram (06/15/2019, [facility]):  LVEF greater than 55% with mild LVH. Normal RV contraction. Mild MR. Moderate aortic stenosis. Mild to moderate TR.  HTN continue Imdur, lisinopril, metoprolol  Type 2 diabetes  HbA1C checked 2 days ago was at goal at 7.0 Sensitive SSI  hx of TIA continue aspirin and plavix  OSA continue CPAP  DVT prophylaxis:.Lovenox Code  Status: DNR-confirmed patient with daughter witnessing at bedside Family Communication: Plan discussed with patient at bedside  disposition Plan: Home with observation Consults called:  Admission status: Observation Status is: Observation  The patient remains OBS appropriate and will d/c before 2 midnights.  Dispo: The patient is from: snf              Anticipated d/c is to: SNF              Anticipated d/c date is: 1 day              Patient currently is not medically stable to d/c.         Orene Desanctis DO [facility]   If 7PM-7AM, please contact night-coverage [email or web address]   11/03/2019, 12:31 AM

## 2019-11-03 NOTE — Telephone Encounter (Signed)
Spoke with patients daughter per release form and she states patient lives in [place] on [address] in [place]. Advised that I would give them a call to get requested information by Dr. Rockey Situ. Called facility and spoke with her nurse to get information requested. Gave her our fax number and she stated that she would get that over to [place]. Will watch for fax and once received will make copy and also give to Dr. Rockey Situ for his review.

## 2019-11-03 NOTE — Consult Note (Signed)
Cardiology Consult    Patient ID: Lydia King MRN: [id number], DOB/AGE: [birth year]   Admit date: 11/02/2019 Date of Consult: 11/03/2019  Primary Physician: Tower, Wynelle Fanny, MD Primary Cardiologist: Ida Rogue, MD Requesting Provider: Chauncey Cruel. Posey Pronto, MD  Patient Profile    Lydia King is a 86 y.o. female with a history of coronary artery disease status post PCI of the LAD and circumflex in May 2021, moderate aortic stenosis, HFpEF, hypertension, hyperlipidemia, diabetes, Charcot-Marie-Tooth, stroke, and hiatal hernia who is being seen today for the evaluation of chest pain at the request of Dr. Posey Pronto.  Past Medical History   Past Medical History:  Diagnosis Date  . (HFpEF) heart failure with preserved ejection fraction ([place])    a. 06/2019 Echo ([facility]): EF >55%, mild conc LVH. Mild-mod AS, mild MR/TR.  Marland Kitchen Anemia    in past (from blood loss at hiatal hernia)  . Asthma   . CAD (coronary artery disease)    a. 05/2019 MI/PCI: ost LCX (rota + cba-->3.0x16 Synergy DES), LAD 90 (3.0x16 Synergy DES).  . Charcot-Marie-Tooth disease    walks with cane (fairly controlled)  . Diverticulosis   . DVT (deep venous thrombosis) ([place]) 2003   after sx  . GERD (gastroesophageal reflux disease)   . Grief reaction    on fluoxetine  . Headache(784.0)    migraine  . Hiatal hernia   . Hyperlipidemia   . Hypertension   . OA (ocular albinism) ([place])   . Osteopenia   . PUD (peptic ulcer disease)    gastric, past  . Shingles   . Stroke ([place]) 8/10   posterior reversible encephalopathy syndrome  . Urine incontinence     Past Surgical History:  Procedure Laterality Date  . carotid dopplers  8/10  . COLONOSCOPY  2002  . HERNIA REPAIR  2002   bleeding  . HIATAL HERNIA REPAIR  2000   needed blood transfusion  . Stroke  8/10   hosp stroke/ posterior (vs posterior reversible encephalopathy syndrome) - d/c on Plavix  . TEE normal  7/82   no emoblic source  . TOTAL KNEE ARTHROPLASTY  03,06    Bilateral  . VESICOVAGINAL FISTULA CLOSURE W/ TAH  1964   with bleeding and cervical dysplasia     Allergies  Allergies  Allergen Reactions  . Morphine And Related Anaphylaxis  . Other Shortness Of Breath and Other (See Comments)  . Shrimp [Shellfish Allergy] Anaphylaxis    Anaphylaxis-swelling of throat/sob    . Amlodipine Besylate     REACTION: edema  . Atorvastatin     REACTION: pain- could not walk  . Ciprofloxacin Other (See Comments)    REACTION: unkown Trouble breathing  . Influenza Vaccines Other (See Comments)    Arm swelling, redness, chills, fever  . Iodinated Diagnostic Agents   . Nitrofurantoin   . Omeprazole     REACTION: not effective  . Pantoprazole Sodium     REACTION: does not work well  . Penicillin G Hives    ITCHY RASH  . Penicillins     REACTION: itchy rash  . Simvastatin     REACTION: leg pain  . Sulfonamide Derivatives     REACTION: itchy rash  . Tramadol Other (See Comments)    Took for years, but then told this was potential cause of prior strokes and she should not ever take again  . Codeine Rash    REACTION: itchy rash ITCHY RASH, also breathing trouble  . Prednisone Other (See  Comments)    REACTION: RASH.// SHORTNESS OF BREATH Prior possible flushing reaction in face, but tolerated it in 2020 and 2021 up to 20mg     History of Present Illness    86 y/o ? w/ the above complex PMH including coronary disease, hypertension, hyperlipidemia, moderate aortic stenosis, HFpEF, diabetes, Charcot-Marie-Tooth, stroke, and hiatal hernia.  She suffered a myocardial infarction and was treated at [facility] in May of this year with PCI and drug-eluting stent placement to the LAD and circumflex.  Echocardiogram showing EF 55% with mild MR and moderate aortic stenosis.  She was hospitalized in July of this year with heart failure symptoms and chest pain.  Troponin was normal.  She was diuresed and subsequently discharged home.  She notes that ever since her  myocardial infarction, she has been experiencing intermittent discomfort in the center of her chest sometimes associated with dyspnea, occurring at least once every 2 to 4 weeks, lasting several hours at a time, and resolving spontaneously.  She notes that she was previously told that she had a large hiatal hernia.  Review of outside records show that a CT of the chest in March of this year redemonstrated a large hiatal hernia containing stomach and loops of the small bowel, which was unchanged.  She was seen by bariatrics at [facility] on April 8, at which time she reported chest pain symptoms consistent w/ paraesophageal hernia.  She was felt to be at high surgical risk due to comorbidities but that lap gastropexy and G tube could be considered once she recovered from recent knee surgery.  Since her July hospitalization, she has continued to have intermittent chest pain.  She has also had markedly elevated blood pressures w/ systolics often trending in the 180's to 200's.  This returned @ 10:30 am on 10/6, assoc w/ intermittent dyspnea, and lasted throughout the day. This was not changed by position changes, eating, palpation, or activity.  Due to ongoing Ss, she presented to the [facility] ED for further eval. Here, ECG unremarkable and troponins nl.  CT of the chest again notable for a large, paraesophageal hernia, likely type 3, which contains a portion of the gastric fundus and body above the level of the diaphragm.  We have been asked to eval.  Total duration of c/p on 10/6 was ~ 12 hrs.  She has noted some chest wall tenderness this AM but otw feels well.    Inpatient Medications    . aspirin EC  81 mg Oral Daily  . calcium-vitamin D  1 tablet Oral Q breakfast  . cholecalciferol  2,000 Units Oral Daily  . clopidogrel  75 mg Oral QHS  . enoxaparin (LOVENOX) injection  40 mg Subcutaneous Q24H  . famotidine  20 mg Oral QHS  . FLUoxetine  20 mg Oral Daily  . insulin aspart  0-5 Units Subcutaneous QHS  .  insulin aspart  0-9 Units Subcutaneous TID WC  . isosorbide mononitrate  30 mg Oral Daily  . lisinopril  10 mg Oral Daily  . melatonin  5 mg Oral QHS  . metoprolol succinate  25 mg Oral QHS  . NON FORMULARY 40 tablet  40 tablet Oral Daily  . omega-3 acid ethyl esters  1 g Oral BID  . rosuvastatin  10 mg Oral Daily  . senna-docusate  2 tablet Oral Daily  . cyanocobalamin  1,000 mcg Oral Daily    Family History    Family History  Problem Relation Age of Onset  . Cerebral aneurysm  Son   . Cerebral aneurysm Father   . Hyperlipidemia Father   . Alcohol abuse Father   . Stroke Mother   . Cervical cancer Sister   . Hyperlipidemia Brother   . Alcohol abuse Brother   . Stroke Brother   . Diabetes Brother   . Diabetes Sister   . Sarcoidosis Sister   . Other Sister        OP/ Broken hip  . Other Other        whole family has charcot marie tooth  . Ovarian cancer Sister    She indicated that the status of her mother is unknown. She indicated that the status of her father is unknown. She indicated that the status of her son is unknown. She indicated that the status of her other is unknown.   Social History    Social History   Socioeconomic History  . Marital status: Widowed    Spouse name: Not on file  . Number of children: Not on file  . Years of education: Not on file  . Highest education level: Not on file  Occupational History  . Not on file  Tobacco Use  . Smoking status: Never Smoker  . Smokeless tobacco: Never Used  Vaping Use  . Vaping Use: Never used  Substance and Sexual Activity  . Alcohol use: No  . Drug use: No  . Sexual activity: Not Currently  Other Topics Concern  . Not on file  Social History Narrative   G6P4   Son died 2 years ago cerebral anerysm   Social Determinants of Health   Financial Resource Strain:   . Difficulty of Paying Living Expenses: Not on file  Food Insecurity:   . Worried About Charity fundraiser in the Last Year: Not on  file  . Ran Out of Food in the Last Year: Not on file  Transportation Needs:   . Lack of Transportation (Medical): Not on file  . Lack of Transportation (Non-Medical): Not on file  Physical Activity:   . Days of Exercise per Week: Not on file  . Minutes of Exercise per Session: Not on file  Stress:   . Feeling of Stress : Not on file  Social Connections:   . Frequency of Communication with Friends and Family: Not on file  . Frequency of Social Gatherings with Friends and Family: Not on file  . Attends Religious Services: Not on file  . Active Member of Clubs or Organizations: Not on file  . Attends Archivist Meetings: Not on file  . Marital Status: Not on file  Intimate Partner Violence:   . Fear of Current or Ex-Partner: Not on file  . Emotionally Abused: Not on file  . Physically Abused: Not on file  . Sexually Abused: Not on file     Review of Systems    General:  No chills, fever, night sweats or weight changes.  Cardiovascular:  +++ chest pain,+++ dyspnea on exertion, occas edema - currently improved, no orthopnea, palpitations, paroxysmal nocturnal dyspnea. Dermatological: No rash, lesions/masses Respiratory: No cough, +++ dyspnea Urologic: No hematuria, dysuria Abdominal:   No nausea, vomiting, diarrhea, bright red blood per rectum, melena, or hematemesis Neurologic:  No visual changes, wkns, changes in mental status. All other systems reviewed and are otherwise negative except as noted above.  Physical Exam    Blood pressure 140/60, pulse (!) 58, temperature 98.3 F (36.8 C), resp. rate 16, height 4\' 11"  (1.499 m), weight 67.6 kg,  SpO2 97 %.  General: Pleasant, NAD Psych: Normal affect. Neuro: Alert and oriented X 3. Moves all extremities spontaneously. HEENT: Normal  Neck: Supple without bruits or JVD. Lungs:  Resp regular and unlabored, CTA. Heart: RRR no s3, s4, 2/6 SEM throughout. Abdomen: Soft, non-tender, non-distended, BS + x 4.  Extremities:  No clubbing, cyanosis or edema. DP/PT/Radials 2+ and equal bilaterally.  Labs    Cardiac Enzymes Recent Labs  Lab 11/02/19 2102 11/02/19 2228 11/03/19 0030  TROPONINIHS 13 15 7       Lab Results  Component Value Date   WBC 5.0 11/02/2019   HGB 11.0 (L) 11/02/2019   HCT 33.5 (L) 11/02/2019   MCV 89.1 11/02/2019   PLT 172 11/02/2019    Recent Labs  Lab 10/31/19 1637 10/31/19 1637 11/02/19 2102  NA 137   < > 132*  K 4.1   < > 4.2  CL 97   < > 94*  CO2 30   < > 29  BUN 13   < > 13  CREATININE 0.71   < > 0.85  CALCIUM 9.9   < > 8.9  PROT 6.4  --   --   BILITOT 0.4  --   --   ALKPHOS 72  --   --   ALT 11  --   --   AST 12  --   --   GLUCOSE 125*   < > 153*   < > = values in this interval not displayed.   Lab Results  Component Value Date   CHOL 179 10/31/2019   HDL 57.60 10/31/2019   LDLCALC 85 10/31/2019   TRIG 180.0 (H) 10/31/2019     Radiology Studies    DG Chest 2 View  Result Date: 11/02/2019 CLINICAL DATA:  Chest pain. Patient reports pain with deep breathing. EXAM: CHEST - 2 VIEW COMPARISON:  Radiograph 07/31/2019 FINDINGS: Stable heart size. Unchanged mediastinal contours. Large retrocardiac hiatal hernia. Improving interstitial opacities from prior exam. No acute or focal airspace disease. No pneumothorax. Advanced chronic change of both shoulders. Degenerative change in the spine. IMPRESSION: 1. No acute chest findings. 2. Large hiatal hernia. Electronically Signed   By: Keith Rake M.D.   On: 11/02/2019 20:46   CT Chest Wo Contrast  Result Date: 11/02/2019 CLINICAL DATA:  Chest pain shortness of breath EXAM: CT CHEST WITHOUT CONTRAST TECHNIQUE: Multidetector CT imaging of the chest was performed following the standard protocol without IV contrast. COMPARISON:  Radiograph same day FINDINGS: Cardiovascular: There is mild cardiomegaly. Coronary artery calcifications as well as aortic valve calcifications are noted. Mild scattered aortic atherosclerosis  is seen. There is no pericardial thickening or effusion. Mediastinum/Nodes: There are no enlarged mediastinal, hilar or axillary lymph nodes.There is a large paraesophageal hernia which contains a portion of the gastric fundus and mid body above the level of the diaphragm. There is a small amount of refluxed debris seen within the distal esophagus. The thyroid gland, trachea are unremarkable. Lungs/Pleura: Rounded patchy airspace opacity at the posterior right lung base, likely atelectasis or scarring. No suspicious pulmonary nodules. There is no pleural effusion. Upper abdomen: The visualized portion of the upper abdomen is unremarkable. Musculoskeletal/Chest wall: There is no chest wall mass or suspicious osseous finding. No acute osseous abnormality advanced bilateral shoulder osteoarthritis is seen. Slight anterior wedge compression deformities are seen in midthoracic spine with endplate sclerosis and kyphosis. IMPRESSION: Large paraesophageal hernia, likely type 3, which contains a portion of the gastric fundus and body  above the level of the diaphragm. Small amount of gastroesophageal reflux Aortic Atherosclerosis (ICD10-I70.0). Electronically Signed   By: Prudencio Pair M.D.   On: 11/02/2019 22:46    ECG & Cardiac Imaging    pending - personally reviewed.  Assessment & Plan    1.  Chest pain/CAD:  Pt s/p PCI/DES to the LAD and LCX in 05/2019 who has been having intermittent chest pain, approximately 1-2x/month, lasting about 4 hrs at a time, ever since her PCI, and likely before.  She had similar pain during admission to [facility] in July at which time Hstrops were nl.  She had recurrent c/p yesterday, that lasted all day long, prompting ED eval last night.  Despite prolonged Ss, HsTrops have been nl @ 13  15  7.  In the absence of objective evidence of ischemia, c/p does not appear to be cardiac in origin and thus we do not plan on any additional ischemic eval.  C/p likely explained by paraesophageal hernia  (see below). Cont asa, plavix, statin,  blocker, acei.  2. Large paraesophageal hernia: prev eval @ [facility] in April of this year.  Felt to be high risk for surgery at that time and was supposed to f/u after recovery from knee surgery earlier in the year.  Suspect chest pain 2/2 hernia.  Rec surgery eval to reassess.  From a cardiac standpoint, depending on surgical approach, she carries an ~ 11% risk for a major perioperative cardiac event and w/ recent revascularization, nl EF, and nl HsTroponins, does not require additional ischemic eval.  With being out < 6 mos from revascularization, we would prefer to keep her on DAPT, though we could potentially hold plavix therapy after six mos (12/18/19).  3.  Essential HTN:  Poorly controlled @ home per pt/dtr.  I suspect she'd do better switching toprol to carvedilol.  Also room to titrate acei if necessary.  She has been receiving prn clonidine @ assisted living facility, and I suspect this is resulting in rebound HTN.  4.  HL:  Cont statin rx but increase in setting of LDL of 85.  5.  HFpEF:  Euvolemic today.  Has been doing well @ home.  Need to work on BP (see above).  Cont lasix.  Signed, Murray Hodgkins, NP 11/03/2019, 1:33 PM  For questions or updates, please contact   Please consult [email or web address] for contact info under Cardiology/STEMI.

## 2019-11-03 NOTE — Progress Notes (Signed)
[facility] [place] NAME: Lydia King    MR#:  [id number]  DATE OF BIRTH:  [birth year]  SUBJECTIVE:  patient came to the emergency room from [facility] with complaints of chest pain to left chest throbbing radiating to the back and left arm. She feels a bit better today. Wants to eat food. Was seen earlier by Dr. Rockey Situ. No nausea vomiting. Denies any shortness of breath.  Daughter is in the room.  REVIEW OF SYSTEMS:   Review of Systems  Constitutional: Negative for chills, fever and weight loss.  HENT: Negative for ear discharge, ear pain and nosebleeds.   Eyes: Negative for blurred vision, pain and discharge.  Respiratory: Negative for sputum production, shortness of breath, wheezing and stridor.   Cardiovascular: Negative for chest pain, palpitations, orthopnea and PND.  Gastrointestinal: Positive for heartburn. Negative for abdominal pain, diarrhea, nausea and vomiting.  Genitourinary: Negative for frequency and urgency.  Musculoskeletal: Positive for back pain. Negative for joint pain.  Neurological: Negative for sensory change, speech change and focal weakness.  Psychiatric/Behavioral: Negative for depression and hallucinations. The patient is not nervous/anxious.    Tolerating Diet:yes Tolerating PT: pending. Patient normally walks with walker at the facility.  DRUG ALLERGIES:   Allergies  Allergen Reactions  . Morphine And Related Anaphylaxis  . Other Shortness Of Breath and Other (See Comments)  . Shrimp [Shellfish Allergy] Anaphylaxis    Anaphylaxis-swelling of throat/sob    . Amlodipine Besylate     REACTION: edema  . Atorvastatin     REACTION: pain- could not walk  . Ciprofloxacin Other (See Comments)    REACTION: unkown Trouble breathing  . Influenza Vaccines Other (See Comments)    Arm swelling, redness, chills, fever  . Iodinated Diagnostic Agents   . Nitrofurantoin   . Omeprazole     REACTION:  not effective  . Pantoprazole Sodium     REACTION: does not work well  . Penicillin G Hives    ITCHY RASH  . Penicillins     REACTION: itchy rash  . Simvastatin     REACTION: leg pain  . Sulfonamide Derivatives     REACTION: itchy rash  . Tramadol Other (See Comments)    Took for years, but then told this was potential cause of prior strokes and she should not ever take again  . Codeine Rash    REACTION: itchy rash ITCHY RASH, also breathing trouble  . Prednisone Other (See Comments)    REACTION: RASH.// SHORTNESS OF BREATH Prior possible flushing reaction in face, but tolerated it in 2020 and 2021 up to 20mg     VITALS:  Blood pressure 140/60, pulse (!) 58, temperature 98.3 F (36.8 C), resp. rate 16, height 4\' 11"  (1.499 m), weight 67.6 kg, SpO2 97 %.  PHYSICAL EXAMINATION:   Physical Exam  GENERAL:  86 y.o.-year-old patient lying in the bed with no acute distress.    HEENT: Head atraumatic, normocephalic. Oropharynx and nasopharynx clear.  NECK:  Supple, no jugular venous distention. No thyroid enlargement, no tenderness.  LUNGS: Normal breath sounds bilaterally, no wheezing, rales, rhonchi. No use of accessory muscles of respiration.  CARDIOVASCULAR: S1, S2 normal. No murmurs, rubs, or gallops.  ABDOMEN: Soft, nontender, nondistended. Bowel sounds present. No organomegaly or mass.   EXTREMITIES: No cyanosis, clubbing or edema b/l.   Arthritis changes. NEUROLOGIC: Cranial nerves II through XII are intact. No focal Motor or sensory deficits b/l. Generalized weakness PSYCHIATRIC:  patient  is alert and oriented x 3.  SKIN: No obvious rash, lesion, or ulcer.   LABORATORY PANEL:  CBC Recent Labs  Lab 11/02/19 2102  WBC 5.0  HGB 11.0*  HCT 33.5*  PLT 172    Chemistries  Recent Labs  Lab 10/31/19 1637 10/31/19 1637 11/02/19 2102  NA 137   < > 132*  K 4.1   < > 4.2  CL 97   < > 94*  CO2 30   < > 29  GLUCOSE 125*   < > 153*  BUN 13   < > 13  CREATININE 0.71    < > 0.85  CALCIUM 9.9   < > 8.9  AST 12  --   --   ALT 11  --   --   ALKPHOS 72  --   --   BILITOT 0.4  --   --    < > = values in this interval not displayed.   Cardiac Enzymes No results for input(s): TROPONINI in the last 168 hours. RADIOLOGY:  DG Chest 2 View  Result Date: 11/02/2019 CLINICAL DATA:  Chest pain. Patient reports pain with deep breathing. EXAM: CHEST - 2 VIEW COMPARISON:  Radiograph 07/31/2019 FINDINGS: Stable heart size. Unchanged mediastinal contours. Large retrocardiac hiatal hernia. Improving interstitial opacities from prior exam. No acute or focal airspace disease. No pneumothorax. Advanced chronic change of both shoulders. Degenerative change in the spine. IMPRESSION: 1. No acute chest findings. 2. Large hiatal hernia. Electronically Signed   By: Keith Rake M.D.   On: 11/02/2019 20:46   CT Chest Wo Contrast  Result Date: 11/02/2019 CLINICAL DATA:  Chest pain shortness of breath EXAM: CT CHEST WITHOUT CONTRAST TECHNIQUE: Multidetector CT imaging of the chest was performed following the standard protocol without IV contrast. COMPARISON:  Radiograph same day FINDINGS: Cardiovascular: There is mild cardiomegaly. Coronary artery calcifications as well as aortic valve calcifications are noted. Mild scattered aortic atherosclerosis is seen. There is no pericardial thickening or effusion. Mediastinum/Nodes: There are no enlarged mediastinal, hilar or axillary lymph nodes.There is a large paraesophageal hernia which contains a portion of the gastric fundus and mid body above the level of the diaphragm. There is a small amount of refluxed debris seen within the distal esophagus. The thyroid gland, trachea are unremarkable. Lungs/Pleura: Rounded patchy airspace opacity at the posterior right lung base, likely atelectasis or scarring. No suspicious pulmonary nodules. There is no pleural effusion. Upper abdomen: The visualized portion of the upper abdomen is unremarkable.  Musculoskeletal/Chest wall: There is no chest wall mass or suspicious osseous finding. No acute osseous abnormality advanced bilateral shoulder osteoarthritis is seen. Slight anterior wedge compression deformities are seen in midthoracic spine with endplate sclerosis and kyphosis. IMPRESSION: Large paraesophageal hernia, likely type 3, which contains a portion of the gastric fundus and body above the level of the diaphragm. Small amount of gastroesophageal reflux Aortic Atherosclerosis (ICD10-I70.0). Electronically Signed   By: Prudencio Pair M.D.   On: 11/02/2019 22:46   ASSESSMENT AND PLAN:  Lydia King is a 86 y.o. female with medical history significant for CAD with three-vessel coronary disease on catheterization May 2021 but deemed poor candidate for CABG, stent placed to LAD and left circumflex, moderate AS, hypertension,HFpEF,Type 2 diabetes. OSA on CPAP who presents with concerns of chest pain.   Chest pain/Unstable angina with h/o CAD s/p recent cath in may 2021 Large Hiatal hernia--chronic -  Initial troponin of 13-- 15--7.  EKG shows normal sinus rhythm ,  no acute ST elevation her depression. Remains in sinus rhythm on telemetry -cardiology consultation with Dr. Rockey Situ. Continue to monitor at present no plans for cardiac cath unless pt's symptoms are recurring.  -Patient recently had heart cath in May 2021 at [facility]-- PCI with stenting to LAD and ostial circumflex -some of the symptoms could be exacerbated by large hiatal hernia. Continue PPI -PRN chest pain for recurrent chest pain -PRN nitro SL for pain -continue Imdur, lisinopril, metoprolol -on aspirin and Plavix -heart healthy diet  HFpEF -Appears euvolemic -Echocardiogram (06/15/2019, [facility]): LVEF greater than 55% with mild LVH. Normal RV contraction. Mild MR. Moderate aortic stenosis. Mild to moderate TR.  HTN continue Imdur, lisinopril, metoprolol  Type 2 diabetes  HbA1C checked 2 days ago was at goal at  7.0 Sensitive SSI  hx of TIA continue aspirin and plavix  OSA continue CPAP  DVT prophylaxis:.Lovenox Code Status: DNR-confirmed patient with daughter prior to admission Family Communication: Plan discussed with daughter at bedside  disposition Plan: [place] called: cardiology Dr. Rockey Situ Admission status: inpatient Dispo: The patient is from: snf  Anticipated d/c is to: SNF  Anticipated d/c date is: 1 day  Patient currently is not medically stable to d/c. Patient has history of coronary artery disease with recent stenting times two at [facility]. Comes in with chest pain/unstable angina. Given her extensive cardiac history with couple admissions for the same will monitor for another night to ensure patient is stable from cardiac standpoint.  Likely discharge to rehab tomorrow if remains stable. This was discussed with patient and daughter at bedside both are in agreement.       TOTAL TIME TAKING CARE OF THIS PATIENT: *25* minutes.  >50% time spent on counselling and coordination of care  Note: This dictation was prepared with Dragon dictation along with smaller phrase technology. Any transcriptional errors that result from this process are unintentional.  Fritzi Mandes M.D    [facility]   CC: Primary care physician; Tower, Wynelle Fanny, MDPatient ID: Lydia King, female   DOB: [birth year], 86 y.o.   MRN: [id number]

## 2019-11-03 NOTE — Plan of Care (Signed)

## 2019-11-04 DIAGNOSIS — K449 Diaphragmatic hernia without obstruction or gangrene: Secondary | ICD-10-CM | POA: Diagnosis not present

## 2019-11-04 DIAGNOSIS — I10 Essential (primary) hypertension: Secondary | ICD-10-CM

## 2019-11-04 DIAGNOSIS — I5032 Chronic diastolic (congestive) heart failure: Secondary | ICD-10-CM | POA: Diagnosis not present

## 2019-11-04 DIAGNOSIS — R079 Chest pain, unspecified: Secondary | ICD-10-CM | POA: Diagnosis not present

## 2019-11-04 DIAGNOSIS — F419 Anxiety disorder, unspecified: Secondary | ICD-10-CM

## 2019-11-04 DIAGNOSIS — E119 Type 2 diabetes mellitus without complications: Secondary | ICD-10-CM | POA: Diagnosis not present

## 2019-11-04 LAB — GLUCOSE, CAPILLARY
Glucose-Capillary: 114 mg/dL — ABNORMAL HIGH (ref 70–99)
Glucose-Capillary: 161 mg/dL — ABNORMAL HIGH (ref 70–99)
Glucose-Capillary: 146 mg/dL — ABNORMAL HIGH (ref 70–99)

## 2019-11-04 MED ORDER — METFORMIN HCL ER 500 MG PO TB24: 500.0000 mg | ORAL_TABLET | Freq: Every day | ORAL | 11 refills | Status: DC

## 2019-11-04 MED ORDER — TROLAMINE SALICYLATE 10 % EX CREA: 1.0000 "application " | TOPICAL_CREAM | Freq: Two times a day (BID) | CUTANEOUS | 0 refills | Status: DC

## 2019-11-04 MED ORDER — ISOSORBIDE MONONITRATE ER 30 MG PO TB24
30.0000 mg | ORAL_TABLET | Freq: Two times a day (BID) | ORAL | Status: DC
  Administered 2019-11-04: 30 mg via ORAL
  Filled 2019-11-04: qty 1

## 2019-11-04 MED ORDER — ACETAMINOPHEN 500 MG PO TABS: 1000.0000 mg | ORAL_TABLET | Freq: Three times a day (TID) | ORAL | 0 refills | Status: DC

## 2019-11-04 MED ORDER — ACETAMINOPHEN 325 MG PO TABS
650.0000 mg | ORAL_TABLET | Freq: Three times a day (TID) | ORAL | Status: DC
  Administered 2019-11-04: 650 mg via ORAL
  Filled 2019-11-04: qty 2

## 2019-11-04 MED ORDER — LISINOPRIL 10 MG PO TABS: 20.0000 mg | ORAL_TABLET | Freq: Every day | ORAL | 0 refills | Status: DC

## 2019-11-04 MED ORDER — CARVEDILOL 6.25 MG PO TABS: 6.2500 mg | ORAL_TABLET | Freq: Two times a day (BID) | ORAL | 1 refills | Status: DC

## 2019-11-04 MED ORDER — ISOSORBIDE MONONITRATE ER 30 MG PO TB24: 30.0000 mg | ORAL_TABLET | Freq: Two times a day (BID) | ORAL | 3 refills | Status: DC

## 2019-11-04 MED ORDER — ROSUVASTATIN CALCIUM 20 MG PO TABS: ORAL_TABLET | ORAL | 1 refills | Status: DC

## 2019-11-04 NOTE — Discharge Summary (Signed)
[facility] NAME: Lydia King    MR#:  [id number]  DATE OF BIRTH:  1930/10/19  DATE OF ADMISSION:  11/02/2019 ADMITTING PHYSICIAN: Fritzi Mandes, MD  DATE OF DISCHARGE: 11/04/2019  PRIMARY CARE PHYSICIAN: Tower, Wynelle Fanny, MD    ADMISSION DIAGNOSIS:  Chest pain [R07.9] Chest pain with moderate risk for cardiac etiology [R07.9] Unstable angina ([place]) [I20.0]  DISCHARGE DIAGNOSIS:  Atypical chest pain in the setting of large Paraesophageal hernia  SECONDARY DIAGNOSIS:   Past Medical History:  Diagnosis Date  . (HFpEF) heart failure with preserved ejection fraction ([place])    a. 06/2019 Echo ([facility]): EF >55%, mild conc LVH. Mild-mod AS, mild MR/TR.  Marland Kitchen Anemia    in past (from blood loss at hiatal hernia)  . Asthma   . CAD (coronary artery disease)    a. 05/2019 MI/PCI: ost LCX (rota + cba-->3.0x16 Synergy DES), LAD 90 (3.0x16 Synergy DES).  . Charcot-Marie-Tooth disease    walks with cane (fairly controlled)  . Diverticulosis   . DVT (deep venous thrombosis) ([place]) 2003   after sx  . GERD (gastroesophageal reflux disease)   . Grief reaction    on fluoxetine  . Headache(784.0)    migraine  . Hiatal hernia   . Hyperlipidemia   . Hypertension   . OA (ocular albinism) ([place])   . Osteopenia   . PUD (peptic ulcer disease)    gastric, past  . Shingles   . Stroke ([place]) 8/10   posterior reversible encephalopathy syndrome  . Urine incontinence     HOSPITAL COURSE:   [place] a 86 y.o.femalewith medical history significant forCADwiththree-vessel coronary disease on catheterization May 2021 but deemed poor candidate for CABG, stent placed to LAD and left circumflex,moderate AS,hypertension,HFpEF,Type 2 diabetes. OSA on CPAP who presents with concerns of chest pain.   Atypical Chest pain with h/o CAD s/p recent cath in may 2021 Large Hiatal hernia--chronic - Initial troponin of 13-- 15--7. EKG shows normal  sinus rhythm , no acute ST elevation her depression. Remains in sinus rhythm on telemetry -cardiology consultation with Dr. Rockey Situ. Continue to monitor at present no plans for cardiac cath unless pt's symptoms are recurring. Increased imdur to bid, changed to po coreg -Patient recently had heart cath in May 2021 at [facility]-- PCI with stenting to LAD and ostial circumflex -some of the symptoms could be exacerbated by large hiatal hernia. Continue PPI -PRN chest pain for recurrent chest pain -PRN nitro SL for pain -continue Imdur, lisinopril, coreg -on aspirin and Plavix -heart healthy diet -stable today  HFpEF -Appears euvolemic -Echocardiogram (06/15/2019, [facility]): LVEF greater than 55% with mild LVH. Normal RV contraction. Mild MR. Moderate aortic stenosis. Mild to moderate TR.  HTN continue Imdur, lisinopril, coreg  Type 2 diabetes HbA1C checked 2 days ago was at goal at 7.0 Sensitive SSI Change to po metformin XR 500 mg daily. Sugars better -c/o diarrhea could be due to metformin  hx of TIA continue aspirin and plavix  OSA continue CPAP  DVT prophylaxis:.Lovenox Code Status: DNR-confirmed patient with daughter  Family Communication: Plan discussed with daughter at bedside  disposition Plan: [place] called: cardiology Dr. Rockey Situ Admission status: inpatient Dispo: The patient is from:snf Anticipated d/c is to:SNF[facility] Anticipated d/c date is: today Patient currently is  medically stable to d/c. D/c to [facility] today dter and pt agreeable Ok from Dr Donivan Scull standpoint for discharge CONSULTS OBTAINED:  Treatment Team:  Minna Merritts,  MD  DRUG ALLERGIES:   Allergies  Allergen Reactions  . Morphine And Related Anaphylaxis  . Other Shortness Of Breath and Other (See Comments)  . Shrimp [Shellfish Allergy] Anaphylaxis    Anaphylaxis-swelling of throat/sob    . Amlodipine Besylate     REACTION: edema   . Atorvastatin     REACTION: pain- could not walk  . Ciprofloxacin Other (See Comments)    REACTION: unkown Trouble breathing  . Influenza Vaccines Other (See Comments)    Arm swelling, redness, chills, fever  . Iodinated Diagnostic Agents   . Nitrofurantoin   . Omeprazole     REACTION: not effective  . Pantoprazole Sodium     REACTION: does not work well  . Penicillin G Hives    ITCHY RASH  . Penicillins     REACTION: itchy rash  . Simvastatin     REACTION: leg pain  . Sulfonamide Derivatives     REACTION: itchy rash  . Tramadol Other (See Comments)    Took for years, but then told this was potential cause of prior strokes and she should not ever take again  . Codeine Rash    REACTION: itchy rash ITCHY RASH, also breathing trouble  . Prednisone Other (See Comments)    REACTION: RASH.// SHORTNESS OF BREATH Prior possible flushing reaction in face, but tolerated it in 2020 and 2021 up to 20mg     DISCHARGE MEDICATIONS:   Allergies as of 11/04/2019      Reactions   Morphine And Related Anaphylaxis   Other Shortness Of Breath, Other (See Comments)   Shrimp [shellfish Allergy] Anaphylaxis   Anaphylaxis-swelling of throat/sob    Amlodipine Besylate    REACTION: edema   Atorvastatin    REACTION: pain- could not walk   Ciprofloxacin Other (See Comments)   REACTION: unkown Trouble breathing   Influenza Vaccines Other (See Comments)   Arm swelling, redness, chills, fever   Iodinated Diagnostic Agents    Nitrofurantoin    Omeprazole    REACTION: not effective   Pantoprazole Sodium    REACTION: does not work well   Penicillin G Hives   ITCHY RASH   Penicillins    REACTION: itchy rash   Simvastatin    REACTION: leg pain   Sulfonamide Derivatives    REACTION: itchy rash   Tramadol Other (See Comments)   Took for years, but then told this was potential cause of prior strokes and she should not ever take again   Codeine Rash   REACTION: itchy rash ITCHY RASH, also  breathing trouble   Prednisone Other (See Comments)   REACTION: RASH.// SHORTNESS OF BREATH Prior possible flushing reaction in face, but tolerated it in 2020 and 2021 up to 20mg       Medication List    STOP taking these medications   metFORMIN 1000 MG tablet Commonly known as: GLUCOPHAGE Replaced by: metFORMIN 500 MG 24 hr tablet   metoprolol succinate 25 MG 24 hr tablet Commonly known as: TOPROL-XL     TAKE these medications   acetaminophen 500 MG tablet Commonly known as: TYLENOL Take 2 tablets (1,000 mg total) by mouth in the morning, at noon, and at bedtime. What changed:   when to take this  reasons to take this   alum & mag hydroxide-simeth 200-200-20 MG/5ML suspension Commonly known as: MAALOX/MYLANTA Take 30 mLs by mouth in the morning, at noon, and at bedtime.   aspirin 81 MG EC tablet Take 1 tablet (81 mg total) by  mouth daily. Swallow whole.   azelastine 0.1 % nasal spray Commonly known as: ASTELIN Place 1 spray into both nostrils 2 (two) times daily. Use in each nostril as directed   Calcium Carbonate-Vitamin D 600-400 MG-UNIT tablet Take 1 tablet by mouth daily with breakfast.   carvedilol 6.25 MG tablet Commonly known as: COREG Take 1 tablet (6.25 mg total) by mouth 2 (two) times daily with a meal.   cloNIDine 0.1 MG tablet Commonly known as: CATAPRES Take 0.1 mg by mouth 3 (three) times daily as needed.   clopidogrel 75 MG tablet Commonly known as: PLAVIX TAKE 1 TABLET BY MOUTH EVERY DAY   cyanocobalamin 1000 MCG tablet Take 1,000 mcg by mouth daily.   esomeprazole 40 MG capsule Commonly known as: NEXIUM Take 1 capsule (40 mg total) by mouth daily before breakfast.   famotidine 20 MG tablet Commonly known as: PEPCID Take 20 mg by mouth at bedtime.   Flax Seed Oil 1000 MG Caps Take 1 capsule by mouth daily.   FLUoxetine 20 MG capsule Commonly known as: PROZAC Take 1 capsule (20 mg total) by mouth daily.   freestyle lancets Use as  instructedUse to check blood sugar once daily for DM (dx. E11.618)   FreeStyle Lite Devi Use to check blood glucose daily and as needed for diabetes   FREESTYLE LITE test strip Generic drug: glucose blood Use to check blood sugar once daily for DM (dx. E11.618)   furosemide 40 MG tablet Commonly known as: LASIX Take 1 tablet (40 mg total) by mouth daily. What changed:   how much to take  when to take this  reasons to take this   isosorbide mononitrate 30 MG 24 hr tablet Commonly known as: IMDUR Take 1 tablet (30 mg total) by mouth 2 (two) times daily. What changed: when to take this   lidocaine 4 % cream Commonly known as: LMX Apply 1 application topically as needed. What changed: Another medication with the same name was removed. Continue taking this medication, and follow the directions you see here.   lisinopril 10 MG tablet Commonly known as: ZESTRIL Take 2 tablets (20 mg total) by mouth daily. What changed: how much to take   loperamide 2 MG capsule Commonly known as: IMODIUM Take 2 mg by mouth as needed for diarrhea or loose stools.   loratadine 10 MG tablet Commonly known as: CLARITIN TAKE 1 TABLET BY MOUTH EVERY DAY   melatonin 3 MG Tabs tablet Take 3 mg by mouth at bedtime.   metFORMIN 500 MG 24 hr tablet Commonly known as: Glucophage XR Take 1 tablet (500 mg total) by mouth daily with breakfast. Replaces: metFORMIN 1000 MG tablet   nitroGLYCERIN 0.4 MG SL tablet Commonly known as: NITROSTAT Place under the tongue.   omega-3 acid ethyl esters 1 g capsule Commonly known as: LOVAZA TAKE 1 CAPSULE (1 G TOTAL) BY MOUTH DAILY. What changed: when to take this   ondansetron 4 MG tablet Commonly known as: ZOFRAN Take 4 mg by mouth every 4 (four) hours as needed for nausea or vomiting.   polyethylene glycol 17 g packet Commonly known as: MIRALAX / GLYCOLAX Take 17 g by mouth daily as needed.   PRESERVISION AREDS 2+MULTI VIT PO Take 1 tablet by  mouth at bedtime.   ProAir HFA 108 (90 Base) MCG/ACT inhaler Generic drug: albuterol INHALE 2 PUFFS INTO THE LUNGS EVERY 4 (FOUR) HOURS AS NEEDED FOR WHEEZING OR SHORTNESS OF BREATH.   rosuvastatin 20 MG tablet  Commonly known as: CRESTOR TAKE 1 TABLET BY MOUTH EVERYDAY AT BEDTIME What changed: medication strength   sennosides-docusate sodium 8.6-50 MG tablet Commonly known as: SENOKOT-S Take 2 tablets by mouth daily.   traMADol 50 MG tablet Commonly known as: ULTRAM Take 1 tablet (50 mg total) by mouth every 6 (six) hours as needed for moderate pain.   trolamine salicylate 10 % cream Commonly known as: Aspercreme/Aloe Apply 1 application topically 2 (two) times daily. back   Vitamin D 50 MCG (2000 UT) Caps Take 2 capsules by mouth daily.       If you experience worsening of your admission symptoms, develop shortness of breath, life threatening emergency, suicidal or homicidal thoughts you must seek medical attention immediately by calling 911 or calling your MD immediately  if symptoms less severe.  You Must read complete instructions/literature along with all the possible adverse reactions/side effects for all the Medicines you take and that have been prescribed to you. Take any new Medicines after you have completely understood and accept all the possible adverse reactions/side effects.   Please note  You were cared for by a hospitalist during your hospital stay. If you have any questions about your discharge medications or the care you received while you were in the hospital after you are discharged, you can call the unit and asked to speak with the hospitalist on call if the hospitalist that took care of you is not available. Once you are discharged, your primary care physician will handle any further medical issues. Please note that NO REFILLS for any discharge medications will be authorized once you are discharged, as it is imperative that you return to your primary care  physician (or establish a relationship with a primary care physician if you do not have one) for your aftercare needs so that they can reassess your need for medications and monitor your lab values. Today   SUBJECTIVE  I would like my tylenol scheduled. Doing ok No cp today   VITAL SIGNS:  Blood pressure (!) 146/71, pulse 68, temperature 98 F (36.7 C), temperature source Oral, resp. rate 17, height 4\' 11"  (1.499 m), weight 67.6 kg, SpO2 93 %.  I/O:    Intake/Output Summary (Last 24 hours) at 11/04/2019 1151 Last data filed at 11/04/2019 1118 Gross per 24 hour  Intake 240 ml  Output 2100 ml  Net -1860 ml    PHYSICAL EXAMINATION:  GENERAL:  86 y.o.-year-old patient lying in the bed with no acute distress.  LUNGS: Normal breath sounds bilaterally, no wheezing, rales,rhonchi or crepitation. No use of accessory muscles of respiration.  CARDIOVASCULAR: S1, S2 normal. No murmurs, rubs, or gallops.  ABDOMEN: Soft, non-tender, non-distended. Bowel sounds present. No organomegaly or mass.  EXTREMITIES: No pedal edema, cyanosis, or clubbing. arthristic changes NEUROLOGIC: Cranial nerves II through XII are intact. Muscle strength 5/5 in all extremities. Sensation intact. Gait not checked.  PSYCHIATRIC: The patient is alert and oriented x 3.  SKIN: No obvious rash, lesion, or ulcer.   DATA REVIEW:   CBC  Recent Labs  Lab 11/02/19 2102  WBC 5.0  HGB 11.0*  HCT 33.5*  PLT 172    Chemistries  Recent Labs  Lab 10/31/19 1637 10/31/19 1637 11/02/19 2102  NA 137   < > 132*  K 4.1   < > 4.2  CL 97   < > 94*  CO2 30   < > 29  GLUCOSE 125*   < > 153*  BUN 13   < >  13  CREATININE 0.71   < > 0.85  CALCIUM 9.9   < > 8.9  AST 12  --   --   ALT 11  --   --   ALKPHOS 72  --   --   BILITOT 0.4  --   --    < > = values in this interval not displayed.    Microbiology Results   Recent Results (from the past 240 hour(s))  Respiratory Panel by RT PCR (Flu A&B, Covid) - Nasopharyngeal  Swab     Status: None   Collection Time: 11/02/19  9:02 PM   Specimen: Nasopharyngeal Swab  Result Value Ref Range Status   SARS Coronavirus 2 by RT PCR NEGATIVE NEGATIVE Final    Comment: (NOTE) SARS-CoV-2 target nucleic acids are NOT DETECTED.  The SARS-CoV-2 RNA is generally detectable in upper respiratoy specimens during the acute phase of infection. The lowest concentration of SARS-CoV-2 viral copies this assay can detect is 131 copies/mL. A negative result does not preclude SARS-Cov-2 infection and should not be used as the sole basis for treatment or other patient management decisions. A negative result may occur with  improper specimen collection/handling, submission of specimen other than nasopharyngeal swab, presence of viral mutation(s) within the areas targeted by this assay, and inadequate number of viral copies (<131 copies/mL). A negative result must be combined with clinical observations, patient history, and epidemiological information. The expected result is Negative.  Fact Sheet for Patients:  PinkCheek.be  Fact Sheet for Healthcare Providers:  GravelBags.it  This test is no t yet approved or cleared by the [place] FDA and  has been authorized for detection and/or diagnosis of SARS-CoV-2 by FDA under an Emergency Use Authorization (EUA). This EUA will remain  in effect (meaning this test can be used) for the duration of the COVID-19 declaration under Section 564(b)(1) of the Act, 21 U.S.C. section 360bbb-3(b)(1), unless the authorization is terminated or revoked sooner.     Influenza A by PCR NEGATIVE NEGATIVE Final   Influenza B by PCR NEGATIVE NEGATIVE Final    Comment: (NOTE) The Xpert Xpress SARS-CoV-2/FLU/RSV assay is intended as an aid in  the diagnosis of influenza from Nasopharyngeal swab specimens and  should not be used as a sole basis for treatment. Nasal washings and  aspirates are  unacceptable for Xpert Xpress SARS-CoV-2/FLU/RSV  testing.  Fact Sheet for Patients: PinkCheek.be  Fact Sheet for Healthcare Providers: GravelBags.it  This test is not yet approved or cleared by the [place] FDA and  has been authorized for detection and/or diagnosis of SARS-CoV-2 by  FDA under an Emergency Use Authorization (EUA). This EUA will remain  in effect (meaning this test can be used) for the duration of the  Covid-19 declaration under Section 564(b)(1) of the Act, 21  U.S.C. section 360bbb-3(b)(1), unless the authorization is  terminated or revoked. Performed at [facility], [address] [place], [place] [address]     RADIOLOGY:  DG Chest 2 View  Result Date: 11/02/2019 CLINICAL DATA:  Chest pain. Patient reports pain with deep breathing. EXAM: CHEST - 2 VIEW COMPARISON:  Radiograph 07/31/2019 FINDINGS: Stable heart size. Unchanged mediastinal contours. Large retrocardiac hiatal hernia. Improving interstitial opacities from prior exam. No acute or focal airspace disease. No pneumothorax. Advanced chronic change of both shoulders. Degenerative change in the spine. IMPRESSION: 1. No acute chest findings. 2. Large hiatal hernia. Electronically Signed   By: Keith Rake M.D.   On: 11/02/2019 20:46  CT Chest Wo Contrast  Result Date: 11/02/2019 CLINICAL DATA:  Chest pain shortness of breath EXAM: CT CHEST WITHOUT CONTRAST TECHNIQUE: Multidetector CT imaging of the chest was performed following the standard protocol without IV contrast. COMPARISON:  Radiograph same day FINDINGS: Cardiovascular: There is mild cardiomegaly. Coronary artery calcifications as well as aortic valve calcifications are noted. Mild scattered aortic atherosclerosis is seen. There is no pericardial thickening or effusion. Mediastinum/Nodes: There are no enlarged mediastinal, hilar or axillary lymph nodes.There is a large  paraesophageal hernia which contains a portion of the gastric fundus and mid body above the level of the diaphragm. There is a small amount of refluxed debris seen within the distal esophagus. The thyroid gland, trachea are unremarkable. Lungs/Pleura: Rounded patchy airspace opacity at the posterior right lung base, likely atelectasis or scarring. No suspicious pulmonary nodules. There is no pleural effusion. Upper abdomen: The visualized portion of the upper abdomen is unremarkable. Musculoskeletal/Chest wall: There is no chest wall mass or suspicious osseous finding. No acute osseous abnormality advanced bilateral shoulder osteoarthritis is seen. Slight anterior wedge compression deformities are seen in midthoracic spine with endplate sclerosis and kyphosis. IMPRESSION: Large paraesophageal hernia, likely type 3, which contains a portion of the gastric fundus and body above the level of the diaphragm. Small amount of gastroesophageal reflux Aortic Atherosclerosis (ICD10-I70.0). Electronically Signed   By: Prudencio Pair M.D.   On: 11/02/2019 22:46     CODE STATUS:     Code Status Orders  (From admission, onward)         Start     Ordered   11/03/19 0032  Do not attempt resuscitation (DNR)  Continuous       Question Answer Comment  In the event of cardiac or respiratory ARREST Do not call a "code blue"   In the event of cardiac or respiratory ARREST Do not perform Intubation, CPR, defibrillation or ACLS   In the event of cardiac or respiratory ARREST Use medication by any route, position, wound care, and other measures to relive pain and suffering. May use oxygen, suction and manual treatment of airway obstruction as needed for comfort.      11/03/19 0031        Code Status History    Date Active Date Inactive Code Status Order ID Comments User Context   11/02/2019 2308 11/03/2019 0031 Full Code [id number]  Orene Desanctis, DO ED   07/31/2019 2338 08/04/2019 1952 DNR [id number]  Orene Desanctis, DO ED    Advance Care Planning Activity       TOTAL TIME TAKING CARE OF THIS PATIENT: *35* minutes.    Fritzi Mandes M.D  [facility]    CC: Primary care physician; Tower, Wynelle Fanny, MD

## 2019-11-04 NOTE — TOC Transition Note (Signed)
Transition of Care [facility]) - CM/SW Discharge Note   Patient Details  Name: Lydia King MRN: [id number] Date of Birth: [birth year]  [facility]) CM/SW Contact:  Anselm Pancoast, RN Phone Number: 11/04/2019, 5:10 PM   Clinical Narrative:    Confirmed with Tonya @ [place] patient clear to return via EMS. Faxed discharge summary to [phone].   [facility] for non emergent transport.          Patient Goals and CMS Choice        Discharge Placement                       Discharge Plan and Services                                     Social Determinants of Health (SDOH) Interventions     Readmission Risk Interventions No flowsheet data found.

## 2019-11-04 NOTE — TOC Initial Note (Signed)
Transition of Care [facility]) - Initial/Assessment Note    Patient Details  Name: Lydia King MRN: [id number] Date of Birth: 1930-12-13  [facility]) CM/SW Contact:    Anselm Pancoast, RN Phone Number: 11/04/2019, 4:48 PM  Clinical Narrative:                 Spoke to Thompson Caul daughter  [phone] confirmed patient is LTC resident of [facility] and family has spoken with [facility] and confirmed patient is ready to return.   RN CM left message for [facility] for room number and if patient needed COVID test.         Patient Goals and CMS Choice        Expected Discharge Plan and Services           Expected Discharge Date: 11/04/19                                    Prior Living Arrangements/Services                       Activities of Daily Living Home Assistive Devices/Equipment: CPAP, Raised toilet seat with rails, Walker (specify type), Shower chair with back (front wheeled walker) ADL Screening (condition at time of admission) Patient's cognitive ability adequate to safely complete daily activities?: No Is the patient deaf or have difficulty hearing?: No Does the patient have difficulty seeing, even when wearing glasses/contacts?: Yes (cortical blindness) Does the patient have difficulty concentrating, remembering, or making decisions?: No Patient able to express need for assistance with ADLs?: Yes Does the patient have difficulty dressing or bathing?: Yes Independently performs ADLs?: No Communication: Independent Dressing (OT): Needs assistance Is this a change from baseline?: Pre-admission baseline Feeding: Independent Bathing: Needs assistance Toileting: Needs assistance Is this a change from baseline?: Pre-admission baseline In/Out Bed: Needs assistance (cortical blindness) Is this a change from baseline?: Pre-admission baseline Walks in Home: Needs assistance Is this a change from baseline?: Pre-admission  baseline Does the patient have difficulty walking or climbing stairs?: Yes Weakness of Legs: Both Weakness of Arms/Hands: Both  Permission Sought/Granted                  Emotional Assessment              Admission diagnosis:  Chest pain [R07.9] Chest pain with moderate risk for cardiac etiology [R07.9] Unstable angina ([place]) [I20.0] Patient Active Problem List   Diagnosis Date Noted  . Chronic diastolic CHF (congestive heart failure) ([place]) 11/03/2019  . Unstable angina ([place])   . Paraesophageal hiatal hernia   . Chest pain 11/02/2019  . CAD (coronary artery disease) 08/01/2019  . Anemia 08/01/2019  . Acute on chronic congestive heart failure ([place]) 07/31/2019  . Hyperlipidemia associated with type 2 diabetes mellitus ([place]) 07/27/2018  . Sinus congestion 02/05/2018  . Vertigo 05/06/2017  . OSA (obstructive sleep apnea) 01/02/2016  . Pedal edema 01/02/2016  . Fall in home 12/11/2015  . Poor balance 12/11/2015  . Low back pain with sciatica 10/02/2015  . Right hip pain 10/02/2015  . Hearing loss 04/18/2015  . Encounter for Medicare annual wellness exam 10/04/2013  . Type 2 diabetes mellitus without complications ([place]) [id number]/9381  . History of CVA (cerebrovascular accident) 09/12/2008  . OSTEOARTHRITIS, MULTIPLE JOINTS 05/30/2008  . Osteoporosis 05/30/2008  . Vitamin D deficiency 07/14/2007  . Sleep apnea 07/14/2007  .  Essential hypertension 11/30/2006  . Asthma 11/30/2006  . H/O gastroesophageal reflux (GERD) 11/30/2006  . G I BLEED 11/30/2006  . DEEP VENOUS THROMBOPHLEBITIS, HX OF 11/30/2006   PCP:  Abner Greenspan, MD Pharmacy:   [id number] - [place], [place] - [address] [place] [address] Phone: [phone] Fax: [phone]     Social Determinants of Health (SDOH) Interventions    Readmission Risk Interventions No flowsheet data found.

## 2019-11-04 NOTE — Progress Notes (Signed)
Report given to [place] at [facility]. Tele monitor taken off and IV taken out. Patient being transported back to [facility] via EMS. Patient attempted to ambulate to stretcher but got very short of breath with movement in bed so had to be put back in bed and EMS was instructed to transfer patient. Daughter at bedside when discharge instructions were gone over. Both verbalized understanding without any questions or concerns. Evening carvedilol administered and updated on AVS.

## 2019-11-04 NOTE — Plan of Care (Signed)

## 2019-11-04 NOTE — Progress Notes (Signed)
Progress Note  Patient Name: Lydia King Date of Encounter: 11/04/2019  [facility] Cardiologist: Ida Rogue, MD   Subjective   Feels well this morning, Rare very short grabbing chest discomfort this morning, Symptoms resolved after belching " I had a lot of gas" Long discussion concerning her paraesophageal hernia She prefers no surgery at this time, prefers medical management Daughter at the bedside, reviewed medications from nursing facility List is consistent with our medication list She has been receiving PPI, Pepcid, SSRI, She has Tylenol and tramadol for pain and she has been receiving doses as needed  Blood pressure this admission relatively stable, trending higher in the morning.  Inpatient Medications    Scheduled Meds: . acetaminophen  650 mg Oral TID  . aspirin EC  81 mg Oral Daily  . calcium-vitamin D  1 tablet Oral Q breakfast  . carvedilol  6.25 mg Oral BID WC  . cholecalciferol  2,000 Units Oral Daily  . clopidogrel  75 mg Oral QHS  . enoxaparin (LOVENOX) injection  40 mg Subcutaneous Q24H  . famotidine  20 mg Oral QHS  . FLUoxetine  20 mg Oral Daily  . insulin aspart  0-5 Units Subcutaneous QHS  . insulin aspart  0-9 Units Subcutaneous TID WC  . isosorbide mononitrate  30 mg Oral BID  . lisinopril  20 mg Oral Daily  . melatonin  5 mg Oral QHS  . omega-3 acid ethyl esters  1 g Oral BID  . rosuvastatin  20 mg Oral Daily  . senna-docusate  2 tablet Oral Daily  . cyanocobalamin  1,000 mcg Oral Daily   Continuous Infusions:  PRN Meds: nitroGLYCERIN, ondansetron (ZOFRAN) IV   Vital Signs    Vitals:   11/04/19 0426 11/04/19 0755 11/04/19 1023 11/04/19 1115  BP: (!) 139/57 (!) 176/69 (!) 158/57 (!) 146/71  Pulse: 64 66 66 68  Resp: 17 17  17   Temp: 97.9 F (36.6 C) 98.2 F (36.8 C)  98 F (36.7 C)  TempSrc: Oral Oral  Oral  SpO2: 95% 94%  93%  Weight: 67.6 kg     Height:        Intake/Output Summary (Last 24 hours) at 11/04/2019  1326 Last data filed at 11/04/2019 1118 Gross per 24 hour  Intake 240 ml  Output 2100 ml  Net -1860 ml   Last 3 Weights 11/04/2019 11/02/2019 10/31/2019  Weight (lbs) 149 lb 149 lb 148 lb  Weight (kg) 67.586 kg 67.586 kg 67.132 kg      Telemetry    Normal sinus rhythm- Personally Reviewed  ECG    - Personally Reviewed  Physical Exam   GEN: No acute distress.   Neck: No JVD Cardiac: RRR, no murmurs, rubs, or gallops.  Respiratory: Clear to auscultation bilaterally. GI: Soft, nontender, non-distended  MS: No edema; No deformity. Neuro:  Nonfocal  Psych: Normal affect   Labs    High Sensitivity Troponin:   Recent Labs  Lab 11/02/19 2102 11/02/19 2228 11/03/19 0030  TROPONINIHS 13 15 7       Chemistry Recent Labs  Lab 10/31/19 1637 11/02/19 2102  NA 137 132*  K 4.1 4.2  CL 97 94*  CO2 30 29  GLUCOSE 125* 153*  BUN 13 13  CREATININE 0.71 0.85  CALCIUM 9.9 8.9  PROT 6.4  --   ALBUMIN 4.0  --   AST 12  --   ALT 11  --   ALKPHOS 72  --   BILITOT 0.4  --  GFRNONAA  --  >60  ANIONGAP  --  9     Hematology Recent Labs  Lab 10/31/19 1637 11/02/19 2102  WBC 6.8 5.0  RBC 4.40 3.76*  HGB 12.7 11.0*  HCT 38.7 33.5*  MCV 88.0 89.1  MCH  --  29.3  MCHC 32.8 32.8  RDW 14.6 13.4  PLT 211.0 172    BNPNo results for input(s): BNP, PROBNP in the last 168 hours.   DDimer No results for input(s): DDIMER in the last 168 hours.   Radiology    DG Chest 2 View  Result Date: 11/02/2019 CLINICAL DATA:  Chest pain. Patient reports pain with deep breathing. EXAM: CHEST - 2 VIEW COMPARISON:  Radiograph 07/31/2019 FINDINGS: Stable heart size. Unchanged mediastinal contours. Large retrocardiac hiatal hernia. Improving interstitial opacities from prior exam. No acute or focal airspace disease. No pneumothorax. Advanced chronic change of both shoulders. Degenerative change in the spine. IMPRESSION: 1. No acute chest findings. 2. Large hiatal hernia. Electronically  Signed   By: Keith Rake M.D.   On: 11/02/2019 20:46   CT Chest Wo Contrast  Result Date: 11/02/2019 CLINICAL DATA:  Chest pain shortness of breath EXAM: CT CHEST WITHOUT CONTRAST TECHNIQUE: Multidetector CT imaging of the chest was performed following the standard protocol without IV contrast. COMPARISON:  Radiograph same day FINDINGS: Cardiovascular: There is mild cardiomegaly. Coronary artery calcifications as well as aortic valve calcifications are noted. Mild scattered aortic atherosclerosis is seen. There is no pericardial thickening or effusion. Mediastinum/Nodes: There are no enlarged mediastinal, hilar or axillary lymph nodes.There is a large paraesophageal hernia which contains a portion of the gastric fundus and mid body above the level of the diaphragm. There is a small amount of refluxed debris seen within the distal esophagus. The thyroid gland, trachea are unremarkable. Lungs/Pleura: Rounded patchy airspace opacity at the posterior right lung base, likely atelectasis or scarring. No suspicious pulmonary nodules. There is no pleural effusion. Upper abdomen: The visualized portion of the upper abdomen is unremarkable. Musculoskeletal/Chest wall: There is no chest wall mass or suspicious osseous finding. No acute osseous abnormality advanced bilateral shoulder osteoarthritis is seen. Slight anterior wedge compression deformities are seen in midthoracic spine with endplate sclerosis and kyphosis. IMPRESSION: Large paraesophageal hernia, likely type 3, which contains a portion of the gastric fundus and body above the level of the diaphragm. Small amount of gastroesophageal reflux Aortic Atherosclerosis (ICD10-I70.0). Electronically Signed   By: Prudencio Pair M.D.   On: 11/02/2019 22:46    Cardiac Studies     Patient Profile     86 year old woman with history of paraesophageal hernia, coronary artery disease, MI May 2021 stent placed to LAD and left circumflex, moderate aortic valve  stenosis ejection fraction 55%, congestive heart failure admission July 2021 improved with diuresis, presenting with chest pain  Assessment & Plan    Atypical chest pain Concerning for symptoms from large paraesophageal hernia She does have coronary disease, prior stenting, though symptoms are more atypical in nature -Relieved with belching, has a lot of gas, presenting at rest, fleeting in nature -She is on PPI, Pepcid, Mylanta, milk of magnesia, ----Recommended Gas-X, [employer] for upper GI gas as needed   GERD/large paraesophageal hernia Previously felt not to be a surgical candidate following her MI in May 2021 -If symptoms get worse despite management as above, she will need consultation with surgery -Patient and daughter are aware Patient requesting medical management at this time unless symptoms get worse  Coronary disease with stable  angina No stress testing needed at this time EKG and cardiac enzymes unchanged  Essential hypertension Elevated blood pressure at nursing facility, Trending mildly high here, recommend Imdur 30 twice daily Continue other medications including lisinopril, metoprolol   Long discussion with patient concerning paraesophageal hernia, treatment options, discussed medications with daughter, Discussed management of chest pain and General arthritides  Total encounter time more than 35 minutes  Greater than 50% was spent in counseling and coordination of care with the patient    For questions or updates, please contact [place] Please consult [email or web address] for contact info under        Signed, Ida Rogue, MD  11/04/2019, 1:26 PM

## 2019-11-07 ENCOUNTER — Encounter: Payer: Self-pay | Admitting: Family Medicine

## 2019-11-07 DIAGNOSIS — K219 Gastro-esophageal reflux disease without esophagitis: Secondary | ICD-10-CM | POA: Diagnosis not present

## 2019-11-07 DIAGNOSIS — I509 Heart failure, unspecified: Secondary | ICD-10-CM | POA: Diagnosis not present

## 2019-11-07 DIAGNOSIS — M6281 Muscle weakness (generalized): Secondary | ICD-10-CM | POA: Diagnosis not present

## 2019-11-07 DIAGNOSIS — G600 Hereditary motor and sensory neuropathy: Secondary | ICD-10-CM | POA: Diagnosis not present

## 2019-11-07 DIAGNOSIS — E1159 Type 2 diabetes mellitus with other circulatory complications: Secondary | ICD-10-CM | POA: Diagnosis not present

## 2019-11-07 DIAGNOSIS — I152 Hypertension secondary to endocrine disorders: Secondary | ICD-10-CM | POA: Diagnosis not present

## 2019-11-07 DIAGNOSIS — R278 Other lack of coordination: Secondary | ICD-10-CM | POA: Diagnosis not present

## 2019-11-07 NOTE — Telephone Encounter (Signed)
Provider did review patient medications and MAR. Placed on his desk for review.

## 2019-11-08 DIAGNOSIS — M6281 Muscle weakness (generalized): Secondary | ICD-10-CM | POA: Diagnosis not present

## 2019-11-08 DIAGNOSIS — G600 Hereditary motor and sensory neuropathy: Secondary | ICD-10-CM | POA: Diagnosis not present

## 2019-11-08 DIAGNOSIS — R278 Other lack of coordination: Secondary | ICD-10-CM | POA: Diagnosis not present

## 2019-11-08 NOTE — Telephone Encounter (Signed)
Pt ended up in hospital and cardiology took over and also did additional labs while there. Pt is following up with PCP for a phone call visit tomorrow

## 2019-11-09 ENCOUNTER — Other Ambulatory Visit: Payer: Self-pay

## 2019-11-09 ENCOUNTER — Telehealth (INDEPENDENT_AMBULATORY_CARE_PROVIDER_SITE_OTHER): Payer: Medicare Other | Admitting: Family Medicine

## 2019-11-09 DIAGNOSIS — K449 Diaphragmatic hernia without obstruction or gangrene: Secondary | ICD-10-CM | POA: Diagnosis not present

## 2019-11-09 DIAGNOSIS — R079 Chest pain, unspecified: Secondary | ICD-10-CM | POA: Diagnosis not present

## 2019-11-09 DIAGNOSIS — I10 Essential (primary) hypertension: Secondary | ICD-10-CM | POA: Diagnosis not present

## 2019-11-09 DIAGNOSIS — R0981 Nasal congestion: Secondary | ICD-10-CM

## 2019-11-09 DIAGNOSIS — G4733 Obstructive sleep apnea (adult) (pediatric): Secondary | ICD-10-CM

## 2019-11-09 DIAGNOSIS — I5032 Chronic diastolic (congestive) heart failure: Secondary | ICD-10-CM | POA: Diagnosis not present

## 2019-11-09 DIAGNOSIS — M8000XS Age-related osteoporosis with current pathological fracture, unspecified site, sequela: Secondary | ICD-10-CM

## 2019-11-09 DIAGNOSIS — Z8673 Personal history of transient ischemic attack (TIA), and cerebral infarction without residual deficits: Secondary | ICD-10-CM

## 2019-11-09 DIAGNOSIS — J4520 Mild intermittent asthma, uncomplicated: Secondary | ICD-10-CM

## 2019-11-09 DIAGNOSIS — M8949 Other hypertrophic osteoarthropathy, multiple sites: Secondary | ICD-10-CM

## 2019-11-09 DIAGNOSIS — D649 Anemia, unspecified: Secondary | ICD-10-CM | POA: Diagnosis not present

## 2019-11-09 DIAGNOSIS — F419 Anxiety disorder, unspecified: Secondary | ICD-10-CM

## 2019-11-09 DIAGNOSIS — E1169 Type 2 diabetes mellitus with other specified complication: Secondary | ICD-10-CM

## 2019-11-09 DIAGNOSIS — E119 Type 2 diabetes mellitus without complications: Secondary | ICD-10-CM

## 2019-11-09 DIAGNOSIS — M6281 Muscle weakness (generalized): Secondary | ICD-10-CM | POA: Diagnosis not present

## 2019-11-09 DIAGNOSIS — M159 Polyosteoarthritis, unspecified: Secondary | ICD-10-CM

## 2019-11-09 DIAGNOSIS — R278 Other lack of coordination: Secondary | ICD-10-CM | POA: Diagnosis not present

## 2019-11-09 DIAGNOSIS — G600 Hereditary motor and sensory neuropathy: Secondary | ICD-10-CM | POA: Diagnosis not present

## 2019-11-09 DIAGNOSIS — E785 Hyperlipidemia, unspecified: Secondary | ICD-10-CM

## 2019-11-09 MED ORDER — LISINOPRIL 20 MG PO TABS: 20.0000 mg | ORAL_TABLET | Freq: Every day | ORAL | 3 refills | Status: DC

## 2019-11-09 MED ORDER — FUROSEMIDE 40 MG PO TABS
40.0000 mg | ORAL_TABLET | Freq: Every day | ORAL | 0 refills | Status: DC
Start: 2019-11-09 — End: 2022-06-24

## 2019-11-09 MED ORDER — FLUOXETINE HCL 40 MG PO CAPS: 40.0000 mg | ORAL_CAPSULE | Freq: Every day | ORAL | 3 refills | Status: DC

## 2019-11-09 MED ORDER — PRESERVISION AREDS 2 PO CAPS: ORAL_CAPSULE | ORAL | 3 refills | Status: DC

## 2019-11-09 NOTE — Assessment & Plan Note (Signed)
Recent reassuring hospitalization/workup Reviewed hospital records, lab results and studies in detail   Hiatal hernia may have been the cause  tx with ppi and H2 blocker since she is not a surgical candidate

## 2019-11-09 NOTE — Assessment & Plan Note (Signed)
Chronic pain now helped with aspercreme and also lidocaine cream prn  Tylenol prn  Tramadol prn -uses rarely -warned of habit and sedation

## 2019-11-09 NOTE — Assessment & Plan Note (Signed)
Has albuterol mdi at bedside for prn use  Does not nee often

## 2019-11-09 NOTE — Assessment & Plan Note (Signed)
Continues claritin  Recommend she continue astelin ns also

## 2019-11-09 NOTE — Assessment & Plan Note (Signed)
Disc goals for lipids and reasons to control them Rev last labs with pt Rev low sat fat diet in detail  Controlled with  crestor 20 mg daily  lovaza 1g bid  Flax seed oil  Also diet controlled at her residence  Last LDL 78

## 2019-11-09 NOTE — Assessment & Plan Note (Signed)
After last hospitalization lasix was changed to 40 mg every day  No problems /no c/o sob

## 2019-11-09 NOTE — Assessment & Plan Note (Signed)
Continues use of cpap

## 2019-11-09 NOTE — Assessment & Plan Note (Signed)
Lab Results  Component Value Date   HGBA1C 7.0 (H) 10/31/2019   This was after much stress /now diet and stress are improving  Recent change to metfomrin xr to minimize diarrhea

## 2019-11-09 NOTE — Assessment & Plan Note (Signed)
Worse lately in the setting of chronic pain and illness Plan to increase her fluoxetine from 20 to 40 mg  Spirits are up since last hospitalization  Getting more exercise with PT  Daughter will monitor  Disc possible side effects  Note written to allow her to visit 2-3 times per week for emotional support  Will aim to also tx chronic pain more effectively

## 2019-11-09 NOTE — Assessment & Plan Note (Signed)
S/p recent hospitalization, noted that BP goes up in response to stress/anxiety and pain  will plan to continue Carvedilol 6.25mg  daily  imdur 30 mg daily  Lisinopril 20 mg daily  Lasix 40 mg daily  Clonidine 0.1 mg prn elevated bp  Her residence and daughter will continue to monitor her bp and update [place]

## 2019-11-09 NOTE — Assessment & Plan Note (Signed)
Discussed during last hospitalization- most likely cause of recent chest pain  Not a good surgical candidate Will continue max med for acid with ppi and H2 blocker and maalox prn  Will continue to monitor  Continue B12 oral also

## 2019-11-09 NOTE — Assessment & Plan Note (Signed)
Continues plavix and 81 mg asa  Tolerates well

## 2019-11-09 NOTE — Assessment & Plan Note (Signed)
Hb in hospital 11.0  This may have been dilutional  Nutrition is improved Will monitor

## 2019-11-09 NOTE — Assessment & Plan Note (Signed)
Continues ca and D  More exercise with PT now  Fall prevention disc

## 2019-11-09 NOTE — Progress Notes (Signed)
Virtual Visit via Telephone Note  I connected with Lydia King on 11/09/19 at  4:00 PM EDT by telephone and verified that I am speaking with the correct person using two identifiers.  Location: Patient: home Provider: office   I discussed the limitations, risks, security and privacy concerns of performing an evaluation and management service by telephone and the availability of in person appointments. I also discussed with the patient that there may be a patient responsible charge related to this service. The patient expressed understanding and agreed to proceed.  Parties involved in encounter  Patient:Lydia King Daughter : Lydia King Call (who did the talking)  Provider:  Loura Pardon MD     History of Present Illness: Pt presents for medication review and to discuss recent hospitalization  and chronic pain and anxiety   She was hospitalized from 10/6 to 11/04/19 for chest pain in the setting of paraesophageal hernia hosp course as follows: Atypical Chest pain with h/o CAD s/p recent cath in may 2021 Large Hiatal hernia--chronic -Initial troponin of 13--15--7. EKG shows normal sinus rhythm,no acute ST elevation her depression. Remains in sinus rhythm on telemetry -cardiology consultation with Dr. Rockey Situ. Continue to monitor at present no plans for cardiac cathunless pt's symptoms are recurring.Increased imdur to bid, changed to po coreg -Patient recently had heart cath in May 2021 at [facility] with stenting to LAD and ostial circumflex -some of the symptoms could be exacerbated by large hiatal hernia. Continue PPI -PRN chest pain for recurrent chest pain -PRN nitro SL for pain -continue Imdur, lisinopril, coreg -on aspirin and Plavix -heart healthy diet -stable today  Taking nexium for her HH and GERD Also maalox 30 ml three times daily  Per daughter her chest pain is much improved She sips carbonated drink to burp and this helps (also gas ex)   HFpEF -Appears  euvolemic -Echocardiogram (06/15/2019, [facility]): LVEF greater than 55% with mild LVH. Normal RV contraction. Mild MR. Moderate aortic stenosis. Mild to moderate TR.  HTN continue Imdur, lisinopril, coreg  Lab Results  Component Value Date   CREATININE 0.85 11/02/2019   BUN 13 11/02/2019   NA 132 (L) 11/02/2019   K 4.2 11/02/2019   CL 94 (L) 11/02/2019   CO2 29 11/02/2019   Lab Results  Component Value Date   ALT 11 10/31/2019   AST 12 10/31/2019   ALKPHOS 72 10/31/2019   BILITOT 0.4 10/31/2019   Lab Results  Component Value Date   WBC 5.0 11/02/2019   HGB 11.0 (L) 11/02/2019   HCT 33.5 (L) 11/02/2019   MCV 89.1 11/02/2019   PLT 172 11/02/2019     Type 2 diabetes HbA1C checked 2 days ago was at goal at 7.0 Sensitive SSI Change to po metformin XR 500 mg daily. Sugars better (changed for diarrhea) -that is getting better   Appetite is improving  Usually complains about food at her residence   hx of TIA continue aspirin and plavix  OSA continue   BP Readings from Last 3 Encounters:  11/04/19 (!) 153/72  10/31/19 (!) 144/82  10/21/19 140/83   Pulse Readings from Last 3 Encounters:  11/04/19 62  10/31/19 81  09/28/19 68   Carvedilol 6.25 -just started  imdur 30 mg daily  Lisinopril 20 mg daily (did inc that)  Clonidine 0.1 mg prn   Cholesterol  On statin  Lab Results  Component Value Date   CHOL 179 10/31/2019   HDL 57.60 10/31/2019   LDLCALC 85 10/31/2019  LDLDIRECT 78.0 07/14/2016   TRIG 180.0 (H) 10/31/2019   CHOLHDL 3 10/31/2019   Now at her residence- able to schedule some of her pain relieving medications   Anxiety  154/59 bp yesterday  PT is going very well (for balance and strength)  She is experiencing a lot of anxiety  Environment is loud (other residents have dementia and yell at night)  Would like to go up on the fluoxetine    The paliative nurse is going to visit   proair prn wheezing   For pain- Dr Rockey Situ px asper creme  and it is starting to help when used consistently  Tramadol for pain Q 6 hours (prn)-has only asked for it once  Also tylenol and lidocaine cream   Has foot doctor planned to come on Monday and trim nails   Daughter needs note to be able to visit (compassionate care act) to visit 2-3 times week    Review of Systems  Constitutional: Positive for malaise/fatigue. Negative for chills and fever.  HENT: Negative for congestion, ear pain, sinus pain and sore throat.   Eyes: Negative for blurred vision, discharge and redness.  Respiratory: Negative for cough, shortness of breath, wheezing and stridor.   Cardiovascular: Positive for chest pain. Negative for palpitations and leg swelling.  Gastrointestinal: Positive for heartburn. Negative for abdominal pain, blood in stool, diarrhea, melena, nausea and vomiting.  Musculoskeletal: Positive for back pain, joint pain, myalgias and neck pain. Negative for falls.  Skin: Negative for rash.  Neurological: Positive for headaches. Negative for dizziness.    Observations/Objective: Per daughter-pt appears to be in no distress right now Good cognition  Somewhat anxious   Assessment and Plan: Problem List Items Addressed This Visit      Cardiovascular and Mediastinum   Essential hypertension    S/p recent hospitalization, noted that BP goes up in response to stress/anxiety and pain  will plan to continue Carvedilol 6.25mg  daily  imdur 30 mg daily  Lisinopril 20 mg daily  Lasix 40 mg daily  Clonidine 0.1 mg prn elevated bp  Her residence and daughter will continue to monitor her bp and update [place]       Relevant Medications   lisinopril (ZESTRIL) 20 MG tablet   furosemide (LASIX) 40 MG tablet   Chronic diastolic CHF (congestive heart failure) ([place]) - Primary    After last hospitalization lasix was changed to 40 mg every day  No problems /no c/o sob      Relevant Medications   lisinopril (ZESTRIL) 20 MG tablet   furosemide (LASIX) 40 MG  tablet     Respiratory   Asthma    Has albuterol mdi at bedside for prn use  Does not nee often      OSA (obstructive sleep apnea)    Continues use of cpap      Sinus congestion    Continues claritin  Recommend she continue astelin ns also        Endocrine   Type 2 diabetes mellitus without complications ([place])    Lab Results  Component Value Date   HGBA1C 7.0 (H) 10/31/2019   This was after much stress /now diet and stress are improving  Recent change to metfomrin xr to minimize diarrhea       Relevant Medications   lisinopril (ZESTRIL) 20 MG tablet   Hyperlipidemia associated with type 2 diabetes mellitus ([place])    Disc goals for lipids and reasons to control them Rev last labs with pt Rev  low sat fat diet in detail  Controlled with  crestor 20 mg daily  lovaza 1g bid  Flax seed oil  Also diet controlled at her residence  Last LDL 78      Relevant Medications   lisinopril (ZESTRIL) 20 MG tablet     Musculoskeletal and Integument   Osteoarthritis of multiple joints    Chronic pain now helped with aspercreme and also lidocaine cream prn  Tylenol prn  Tramadol prn -uses rarely -warned of habit and sedation       Osteoporosis    Continues ca and D  More exercise with PT now  Fall prevention disc        Other   History of CVA (cerebrovascular accident)    Continues plavix and 81 mg asa  Tolerates well      Anemia    Hb in hospital 11.0  This may have been dilutional  Nutrition is improved Will monitor      Chest pain    Recent reassuring hospitalization/workup Reviewed hospital records, lab results and studies in detail   Hiatal hernia may have been the cause  tx with ppi and H2 blocker since she is not a surgical candidate       Paraesophageal hiatal hernia    Discussed during last hospitalization- most likely cause of recent chest pain  Not a good surgical candidate Will continue max med for acid with ppi and H2 blocker and maalox prn   Will continue to monitor  Continue B12 oral also       Anxiety    Worse lately in the setting of chronic pain and illness Plan to increase her fluoxetine from 20 to 40 mg  Spirits are up since last hospitalization  Getting more exercise with PT  Daughter will monitor  Disc possible side effects  Note written to allow her to visit 2-3 times per week for emotional support  Will aim to also tx chronic pain more effectively      Relevant Medications   FLUoxetine (PROZAC) 40 MG capsule      Follow Up Instructions: We will try increasing fluoxetine dose to 40 mg daily for anxiety  Continue aggressive treatment for acid in light of hiatal hernia Let's continue asper creme and lidocaine cream on a schedule for pain  If pain and anxiety are well controlled then we may have more luck with blood pressure    I discussed the assessment and treatment plan with the patient. The patient was provided an opportunity to ask questions and all were answered. The patient agreed with the plan and demonstrated an understanding of the instructions.   The patient was advised to call back or seek an in-person evaluation if the symptoms worsen or if the condition fails to improve as anticipated.  I provided 40  minutes of non-face-to-face time during this encounter.   Loura Pardon, MD

## 2019-11-10 DIAGNOSIS — G600 Hereditary motor and sensory neuropathy: Secondary | ICD-10-CM | POA: Diagnosis not present

## 2019-11-10 DIAGNOSIS — M6281 Muscle weakness (generalized): Secondary | ICD-10-CM | POA: Diagnosis not present

## 2019-11-10 DIAGNOSIS — R278 Other lack of coordination: Secondary | ICD-10-CM | POA: Diagnosis not present

## 2019-11-11 DIAGNOSIS — G600 Hereditary motor and sensory neuropathy: Secondary | ICD-10-CM | POA: Diagnosis not present

## 2019-11-11 DIAGNOSIS — M6281 Muscle weakness (generalized): Secondary | ICD-10-CM | POA: Diagnosis not present

## 2019-11-11 DIAGNOSIS — R278 Other lack of coordination: Secondary | ICD-10-CM | POA: Diagnosis not present

## 2019-11-11 MED ORDER — LIDOCAINE 4 % EX CREA
1.0000 | TOPICAL_CREAM | Freq: Two times a day (BID) | CUTANEOUS | 11 refills | Status: DC
Start: 2019-11-11 — End: 2022-06-16

## 2019-11-11 MED ORDER — POLYETHYLENE GLYCOL 3350 17 G PO PACK
17.0000 g | PACK | Freq: Every day | ORAL | 11 refills | Status: DC
Start: 2019-11-11 — End: 2022-06-24

## 2019-11-11 MED ORDER — TROLAMINE SALICYLATE 10 % EX CREA
1.0000 | TOPICAL_CREAM | Freq: Every day | CUTANEOUS | 3 refills | Status: DC
Start: 2019-11-11 — End: 2022-06-16

## 2019-11-11 NOTE — Telephone Encounter (Signed)
Please fax the orders to care home Thanks

## 2019-11-11 NOTE — Telephone Encounter (Signed)
Orders faxed to [facility]

## 2019-11-13 ENCOUNTER — Encounter: Payer: Self-pay | Admitting: Family Medicine

## 2019-11-13 NOTE — Patient Instructions (Signed)
We will try increasing fluoxetine dose to 40 mg daily for anxiety  Continue aggressive treatment for acid in light of hiatal hernia Let's continue asper creme and lidocaine cream on a schedule for pain  If pain and anxiety are well controlled then we may have more luck with blood pressure

## 2019-11-14 DIAGNOSIS — R278 Other lack of coordination: Secondary | ICD-10-CM | POA: Diagnosis not present

## 2019-11-14 DIAGNOSIS — M6281 Muscle weakness (generalized): Secondary | ICD-10-CM | POA: Diagnosis not present

## 2019-11-14 DIAGNOSIS — G600 Hereditary motor and sensory neuropathy: Secondary | ICD-10-CM | POA: Diagnosis not present

## 2019-11-15 DIAGNOSIS — I2510 Atherosclerotic heart disease of native coronary artery without angina pectoris: Secondary | ICD-10-CM | POA: Diagnosis not present

## 2019-11-15 DIAGNOSIS — I152 Hypertension secondary to endocrine disorders: Secondary | ICD-10-CM | POA: Diagnosis not present

## 2019-11-15 DIAGNOSIS — M6281 Muscle weakness (generalized): Secondary | ICD-10-CM | POA: Diagnosis not present

## 2019-11-15 DIAGNOSIS — I509 Heart failure, unspecified: Secondary | ICD-10-CM | POA: Diagnosis not present

## 2019-11-15 DIAGNOSIS — G600 Hereditary motor and sensory neuropathy: Secondary | ICD-10-CM | POA: Diagnosis not present

## 2019-11-15 DIAGNOSIS — R278 Other lack of coordination: Secondary | ICD-10-CM | POA: Diagnosis not present

## 2019-11-15 DIAGNOSIS — E1159 Type 2 diabetes mellitus with other circulatory complications: Secondary | ICD-10-CM | POA: Diagnosis not present

## 2019-11-15 NOTE — Telephone Encounter (Signed)
Ext 255

## 2019-11-15 NOTE — Telephone Encounter (Signed)
Lydia King @ [facility] she has questions regarding  aspercream  1 for shoulder area 1 lower back back   Everyday or PRN??  Best number [phone]

## 2019-11-16 DIAGNOSIS — R278 Other lack of coordination: Secondary | ICD-10-CM | POA: Diagnosis not present

## 2019-11-16 DIAGNOSIS — M6281 Muscle weakness (generalized): Secondary | ICD-10-CM | POA: Diagnosis not present

## 2019-11-16 DIAGNOSIS — G600 Hereditary motor and sensory neuropathy: Secondary | ICD-10-CM | POA: Diagnosis not present

## 2019-11-16 NOTE — Telephone Encounter (Signed)
Nurse advised.  

## 2019-11-17 DIAGNOSIS — G600 Hereditary motor and sensory neuropathy: Secondary | ICD-10-CM | POA: Diagnosis not present

## 2019-11-17 DIAGNOSIS — R278 Other lack of coordination: Secondary | ICD-10-CM | POA: Diagnosis not present

## 2019-11-17 DIAGNOSIS — M6281 Muscle weakness (generalized): Secondary | ICD-10-CM | POA: Diagnosis not present

## 2019-11-18 DIAGNOSIS — G600 Hereditary motor and sensory neuropathy: Secondary | ICD-10-CM | POA: Diagnosis not present

## 2019-11-18 DIAGNOSIS — M6281 Muscle weakness (generalized): Secondary | ICD-10-CM | POA: Diagnosis not present

## 2019-11-18 DIAGNOSIS — R278 Other lack of coordination: Secondary | ICD-10-CM | POA: Diagnosis not present

## 2019-11-21 ENCOUNTER — Ambulatory Visit: Payer: Medicare Other | Admitting: Podiatry

## 2019-11-21 DIAGNOSIS — R278 Other lack of coordination: Secondary | ICD-10-CM | POA: Diagnosis not present

## 2019-11-21 DIAGNOSIS — G600 Hereditary motor and sensory neuropathy: Secondary | ICD-10-CM | POA: Diagnosis not present

## 2019-11-21 DIAGNOSIS — M6281 Muscle weakness (generalized): Secondary | ICD-10-CM | POA: Diagnosis not present

## 2019-11-22 ENCOUNTER — Emergency Department: Payer: Medicare Other

## 2019-11-22 ENCOUNTER — Emergency Department
Admission: EM | Admit: 2019-11-22 | Discharge: 2019-11-22 | Disposition: A | Discharge status: Home / Self Care | Payer: Medicare Other | Attending: Emergency Medicine | Admitting: Emergency Medicine

## 2019-11-22 ENCOUNTER — Other Ambulatory Visit: Payer: Self-pay

## 2019-11-22 DIAGNOSIS — Z7982 Long term (current) use of aspirin: Secondary | ICD-10-CM | POA: Diagnosis not present

## 2019-11-22 DIAGNOSIS — I10 Essential (primary) hypertension: Secondary | ICD-10-CM | POA: Diagnosis not present

## 2019-11-22 DIAGNOSIS — R519 Headache, unspecified: Secondary | ICD-10-CM | POA: Diagnosis not present

## 2019-11-22 DIAGNOSIS — Z96653 Presence of artificial knee joint, bilateral: Secondary | ICD-10-CM | POA: Diagnosis not present

## 2019-11-22 DIAGNOSIS — Z743 Need for continuous supervision: Secondary | ICD-10-CM | POA: Diagnosis not present

## 2019-11-22 DIAGNOSIS — I6782 Cerebral ischemia: Secondary | ICD-10-CM | POA: Diagnosis not present

## 2019-11-22 DIAGNOSIS — I110 Hypertensive heart disease with heart failure: Secondary | ICD-10-CM | POA: Insufficient documentation

## 2019-11-22 DIAGNOSIS — I5032 Chronic diastolic (congestive) heart failure: Secondary | ICD-10-CM | POA: Diagnosis not present

## 2019-11-22 DIAGNOSIS — R52 Pain, unspecified: Secondary | ICD-10-CM | POA: Diagnosis not present

## 2019-11-22 DIAGNOSIS — I2510 Atherosclerotic heart disease of native coronary artery without angina pectoris: Secondary | ICD-10-CM | POA: Diagnosis not present

## 2019-11-22 DIAGNOSIS — R609 Edema, unspecified: Secondary | ICD-10-CM | POA: Diagnosis not present

## 2019-11-22 DIAGNOSIS — Z7902 Long term (current) use of antithrombotics/antiplatelets: Secondary | ICD-10-CM | POA: Insufficient documentation

## 2019-11-22 DIAGNOSIS — W19XXXA Unspecified fall, initial encounter: Secondary | ICD-10-CM | POA: Diagnosis not present

## 2019-11-22 DIAGNOSIS — R278 Other lack of coordination: Secondary | ICD-10-CM | POA: Diagnosis not present

## 2019-11-22 DIAGNOSIS — Z7984 Long term (current) use of oral hypoglycemic drugs: Secondary | ICD-10-CM | POA: Diagnosis not present

## 2019-11-22 DIAGNOSIS — E119 Type 2 diabetes mellitus without complications: Secondary | ICD-10-CM | POA: Insufficient documentation

## 2019-11-22 DIAGNOSIS — M25561 Pain in right knee: Secondary | ICD-10-CM | POA: Diagnosis not present

## 2019-11-22 DIAGNOSIS — Z79899 Other long term (current) drug therapy: Secondary | ICD-10-CM | POA: Insufficient documentation

## 2019-11-22 DIAGNOSIS — M6281 Muscle weakness (generalized): Secondary | ICD-10-CM | POA: Diagnosis not present

## 2019-11-22 DIAGNOSIS — G600 Hereditary motor and sensory neuropathy: Secondary | ICD-10-CM | POA: Diagnosis not present

## 2019-11-22 DIAGNOSIS — I6389 Other cerebral infarction: Secondary | ICD-10-CM | POA: Diagnosis not present

## 2019-11-22 DIAGNOSIS — M9711XA Periprosthetic fracture around internal prosthetic right knee joint, initial encounter: Secondary | ICD-10-CM | POA: Diagnosis not present

## 2019-11-22 DIAGNOSIS — J45909 Unspecified asthma, uncomplicated: Secondary | ICD-10-CM | POA: Diagnosis not present

## 2019-11-22 DIAGNOSIS — Z8673 Personal history of transient ischemic attack (TIA), and cerebral infarction without residual deficits: Secondary | ICD-10-CM | POA: Insufficient documentation

## 2019-11-22 DIAGNOSIS — R279 Unspecified lack of coordination: Secondary | ICD-10-CM | POA: Diagnosis not present

## 2019-11-22 MED ORDER — ACETAMINOPHEN 325 MG PO TABS
650.0000 mg | ORAL_TABLET | Freq: Once | ORAL | Status: AC
  Administered 2019-11-22: 650 mg via ORAL
  Filled 2019-11-22: qty 2

## 2019-11-22 NOTE — Discharge Instructions (Addendum)
Take your tylenol and tramadol as prescribed.  See orthopedics for follow up.  Return to the ER for symptoms that change or worsen or for new concerns.

## 2019-11-22 NOTE — ED Notes (Addendum)
patient awaiting transport daughter at bedside.

## 2019-11-22 NOTE — ED Provider Notes (Signed)
[facility] Emergency Department Provider Note ____________________________________________  Time seen: Approximately 2:31 PM  I have reviewed the triage vital signs and the nursing notes.   HISTORY  Chief Complaint Fall and Knee Pain    HPI Lydia King is a 86 y.o. female who presents to the emergency department for evaluation and treatment of right knee pain after falling last night. She is a resident at [facility]. She states that she had just washed her hands and turned around through the paper towel trash and fell. She does not believe that she hit her head. She has however had a headache since then. She has a history of knee replacement on that right side.   Past Medical History:  Diagnosis Date  . (HFpEF) heart failure with preserved ejection fraction ([place])    a. 06/2019 Echo ([facility]): EF >55%, mild conc LVH. Mild-mod AS, mild MR/TR.  Marland Kitchen Anemia    in past (from blood loss at hiatal hernia)  . Asthma   . CAD (coronary artery disease)    a. 05/2019 MI/PCI: ost LCX (rota + cba-->3.0x16 Synergy DES), LAD 90 (3.0x16 Synergy DES).  . Charcot-Marie-Tooth disease    walks with cane (fairly controlled)  . Diverticulosis   . DVT (deep venous thrombosis) ([employer]) 2003   after sx  . GERD (gastroesophageal reflux disease)   . Grief reaction    on fluoxetine  . Headache(784.0)    migraine  . Hiatal hernia   . Hyperlipidemia   . Hypertension   . OA (ocular albinism) ([place])   . Osteopenia   . PUD (peptic ulcer disease)    gastric, past  . Shingles   . Stroke ([place]) 8/10   posterior reversible encephalopathy syndrome  . Urine incontinence     Patient Active Problem List   Diagnosis Date Noted  . Anxiety 11/09/2019  . Chronic diastolic CHF (congestive heart failure) ([place]) 11/03/2019  . Paraesophageal hiatal hernia   . Chest pain 11/02/2019  . CAD (coronary artery disease) 08/01/2019  . Anemia 08/01/2019  . Acute on chronic congestive heart  failure ([place]) 07/31/2019  . Acute decompensated heart failure ([place]) 07/03/2019  . Bradycardia 06/17/2019  . Hypoalbuminemia 06/17/2019  . Severe protein-calorie malnutrition ([place]) 06/17/2019  . Aortic valve stenosis 06/15/2019  . Mitral valve regurgitation 06/15/2019  . Positive cardiac stress test 06/13/2019  . Closed fracture of right distal femur ([place]) 03/15/2019  . Periprosthetic fracture around internal prosthetic right knee joint 03/15/2019  . Hyperlipidemia associated with type 2 diabetes mellitus ([place]) 07/27/2018  . Sinus congestion 02/05/2018  . Vertigo 05/06/2017  . OSA (obstructive sleep apnea) 01/02/2016  . Pedal edema 01/02/2016  . Fall in home 12/11/2015  . Poor balance 12/11/2015  . Low back pain with sciatica 10/02/2015  . Right hip pain 10/02/2015  . Hearing loss 04/18/2015  . Encounter for Medicare annual wellness exam 10/04/2013  . Type 2 diabetes mellitus without complications ([place]) 15/17/6160  . History of CVA (cerebrovascular accident) 09/12/2008  . Osteoarthritis of multiple joints 05/30/2008  . Osteoporosis 05/30/2008  . Vitamin D deficiency 07/14/2007  . Sleep apnea 07/14/2007  . Essential hypertension 11/30/2006  . Asthma 11/30/2006  . H/O gastroesophageal reflux (GERD) 11/30/2006  . G I BLEED 11/30/2006  . DEEP VENOUS THROMBOPHLEBITIS, HX OF 11/30/2006    Past Surgical History:  Procedure Laterality Date  . carotid dopplers  8/10  . COLONOSCOPY  2002  . HERNIA REPAIR  2002   bleeding  . HIATAL HERNIA  REPAIR  2000   needed blood transfusion  . Stroke  8/10   hosp stroke/ posterior (vs posterior reversible encephalopathy syndrome) - d/c on Plavix  . TEE normal  6/30   no emoblic source  . TOTAL KNEE ARTHROPLASTY  03,06   Bilateral  . VESICOVAGINAL FISTULA CLOSURE W/ TAH  1964   with bleeding and cervical dysplasia    Prior to Admission medications   Medication Sig Start Date End Date Taking? Authorizing Provider  acetaminophen (TYLENOL)  500 MG tablet Take 2 tablets (1,000 mg total) by mouth in the morning, at noon, and at bedtime. 11/04/19   Fritzi Mandes, MD  alum & mag hydroxide-simeth (MAALOX/MYLANTA) 200-200-20 MG/5ML suspension Take 30 mLs by mouth in the morning, at noon, and at bedtime.    [provider]  aspirin EC 81 MG EC tablet Take 1 tablet (81 mg total) by mouth daily. Swallow whole. 08/05/19   Bonnielee Haff, MD  azelastine (ASTELIN) 0.1 % nasal spray Place 1 spray into both nostrils 2 (two) times daily. Use in each nostril as directed 08/26/17   Cuthriell, Charline Bills, PA-C  Blood Glucose Monitoring Suppl (FREESTYLE LITE) DEVI Use to check blood glucose daily and as needed for diabetes 10/11/17   Tower, Wynelle Fanny, MD  Calcium Carbonate-Vitamin D 600-400 MG-UNIT tablet Take 1 tablet by mouth daily with breakfast.     [provider]  carvedilol (COREG) 6.25 MG tablet Take 1 tablet (6.25 mg total) by mouth 2 (two) times daily with a meal. 11/04/19   Fritzi Mandes, MD  Cholecalciferol (VITAMIN D) 2000 UNITS CAPS Take 2 capsules by mouth daily.      [provider]  cloNIDine (CATAPRES) 0.1 MG tablet Take 0.1 mg by mouth 3 (three) times daily as needed.    [provider]  clopidogrel (PLAVIX) 75 MG tablet TAKE 1 TABLET BY MOUTH EVERY DAY 07/26/19   Tower, Wynelle Fanny, MD  cyanocobalamin 1000 MCG tablet Take 1,000 mcg by mouth daily.    [provider]  esomeprazole (NEXIUM) 40 MG capsule Take 1 capsule (40 mg total) by mouth daily before breakfast. 07/27/18   Tower, Wynelle Fanny, MD  famotidine (PEPCID) 20 MG tablet Take 20 mg by mouth at bedtime.    [provider]  Flaxseed, Linseed, (FLAX SEED OIL) 1000 MG CAPS Take 1 capsule by mouth daily.      [provider]  FLUoxetine (PROZAC) 20 MG capsule Take 20 mg by mouth daily. 11/20/19   [provider]  FLUoxetine (PROZAC) 40 MG capsule Take 1 capsule (40 mg total) by mouth daily. 11/09/19   Tower, Wynelle Fanny, MD  furosemide  (LASIX) 40 MG tablet Take 1 tablet (40 mg total) by mouth daily. 11/09/19   Tower, Marne A, MD  glucose blood (FREESTYLE LITE) test strip Use to check blood sugar once daily for DM (dx. Z60.109) 11/08/18   Tower, Wynelle Fanny, MD  isosorbide mononitrate (IMDUR) 30 MG 24 hr tablet Take 1 tablet (30 mg total) by mouth 2 (two) times daily. 11/04/19 02/02/20  Fritzi Mandes, MD  Lancets (FREESTYLE) lancets Use as instructedUse to check blood sugar once daily for DM (dx. E11.618) 11/08/18   Tower, Wynelle Fanny, MD  lidocaine (LMX) 4 % cream Apply 1 application topically in the morning and at bedtime. To low back and shoulder areas 11/11/19   Tower, Jackson A, MD  lisinopril (ZESTRIL) 20 MG tablet Take 1 tablet (20 mg total) by mouth daily. 11/09/19  Tower, Wynelle Fanny, MD  loperamide (IMODIUM) 2 MG capsule Take 2 mg by mouth as needed for diarrhea or loose stools.    [provider]  loratadine (CLARITIN) 10 MG tablet TAKE 1 TABLET BY MOUTH EVERY DAY 02/28/19   Tower, Wynelle Fanny, MD  melatonin 3 MG TABS tablet Take 3 mg by mouth at bedtime.    [provider]  metFORMIN (GLUCOPHAGE XR) 500 MG 24 hr tablet Take 1 tablet (500 mg total) by mouth daily with breakfast. 11/04/19 11/03/20  Fritzi Mandes, MD  Multiple Vitamins-Minerals (PRESERVISION AREDS 2) CAPS Give one pill by mouth bid 11/09/19   Tower, Wynelle Fanny, MD  nitroGLYCERIN (NITROSTAT) 0.4 MG SL tablet Place under the tongue. 06/19/19 06/18/20  [provider]  omega-3 acid ethyl esters (LOVAZA) 1 g capsule TAKE 1 CAPSULE (1 G TOTAL) BY MOUTH DAILY. Patient taking differently: Take 1 g by mouth 2 (two) times daily.  07/26/19   Tower, Wynelle Fanny, MD  ondansetron (ZOFRAN) 4 MG tablet Take 4 mg by mouth every 4 (four) hours as needed for nausea or vomiting.    [provider]  polyethylene glycol (MIRALAX / GLYCOLAX) 17 g packet Take 17 g by mouth daily. 11/11/19   Tower, Wynelle Fanny, MD  PROAIR HFA 108 [phone] Base) MCG/ACT inhaler INHALE 2 PUFFS INTO THE LUNGS  EVERY 4 (FOUR) HOURS AS NEEDED FOR WHEEZING OR SHORTNESS OF BREATH. 03/05/16   Tower, Wynelle Fanny, MD  rosuvastatin (CRESTOR) 20 MG tablet TAKE 1 TABLET BY MOUTH EVERYDAY AT BEDTIME 11/04/19   Fritzi Mandes, MD  sennosides-docusate sodium (SENOKOT-S) 8.6-50 MG tablet Take 2 tablets by mouth daily.    [provider]  traMADol (ULTRAM) 50 MG tablet Take 1 tablet (50 mg total) by mouth every 6 (six) hours as needed for moderate pain. 08/04/19   Bonnielee Haff, MD  trolamine salicylate (ASPERCREME/ALOE) 10 % cream Apply 1 application topically daily. To shoulder areas 11/11/19   Tower, Wynelle Fanny, MD    Allergies Morphine and related, Other, Shrimp [shellfish allergy], Amlodipine besylate, Atorvastatin, Ciprofloxacin, Influenza vaccines, Iodinated diagnostic agents, Nitrofurantoin, Omeprazole, Pantoprazole sodium, Penicillin g, Penicillins, Simvastatin, Sulfonamide derivatives, Tramadol, Codeine, and Prednisone  Family History  Problem Relation Age of Onset  . Cerebral aneurysm Son   . Cerebral aneurysm Father   . Hyperlipidemia Father   . Alcohol abuse Father   . Stroke Mother   . Cervical cancer Sister   . Hyperlipidemia Brother   . Alcohol abuse Brother   . Stroke Brother   . Diabetes Brother   . Diabetes Sister   . Sarcoidosis Sister   . Other Sister        OP/ Broken hip  . Other Other        whole family has charcot marie tooth  . Ovarian cancer Sister     Social History Social History   Tobacco Use  . Smoking status: Never Smoker  . Smokeless tobacco: Never Used  Vaping Use  . Vaping Use: Never used  Substance Use Topics  . Alcohol use: No  . Drug use: No    Review of Systems Constitutional: Negative for fever. Cardiovascular: Negative for chest pain. Respiratory: Negative for shortness of breath. Musculoskeletal: Positive for right knee pain. Skin: Positive for contusion to skin over right knee.  Neurological: Negative for decrease in  sensation  ____________________________________________   PHYSICAL EXAM:  VITAL SIGNS: ED Triage Vitals  Enc Vitals Group     BP 11/22/19 0902 Marland Kitchen)  175/75     Pulse Rate 11/22/19 0902 64     Resp 11/22/19 0902 20     Temp 11/22/19 0902 98.7 F (37.1 C)     Temp Source 11/22/19 0902 Oral     SpO2 11/22/19 0900 98 %     Weight 11/22/19 0905 148 lb (67.1 kg)     Height 11/22/19 0905 4' 11.5" (1.511 m)     Head Circumference --      Peak Flow --      Pain Score 11/22/19 0904 7     Pain Loc --      Pain Edu? --      Excl. in Needles? --     Constitutional: Alert and oriented. Well appearing and in no acute distress. Eyes: Conjunctivae are clear without discharge or drainage Head: Atraumatic Neck: Supple. No focal midline tenderness. Respiratory: No cough. Respirations are even and unlabored. Musculoskeletal: Pain in the right knee with flexion. No obvious deformity . No hip pain.  Neurologic: Motor and sensory function of the right lower extremity is normal.  Skin: Contusion to right prepatellar surface. No open wounds.  Psychiatric: Affect and behavior are appropriate.  ____________________________________________   LABS (all labs ordered are listed, but only abnormal results are displayed)  Labs Reviewed - No data to display ____________________________________________  RADIOLOGY  Image of the right knee shows no new acute findings in comparison with image from July.  I, Lydia King, personally viewed and evaluated these images (plain radiographs) as part of my medical decision making, as well as reviewing the written report by the radiologist.  CT of the head without contrast is negative for acute intracranial abnormality per radiology.  CT Head Wo Contrast  Result Date: 11/22/2019 CLINICAL DATA:  Fall.  On blood thinners EXAM: CT HEAD WITHOUT CONTRAST TECHNIQUE: Contiguous axial images were obtained from the base of the skull through the vertex without intravenous  contrast. COMPARISON:  01/17/2019 FINDINGS: Brain: No evidence of acute infarction, hemorrhage, hydrocephalus, extra-axial collection or mass lesion/mass effect. Stable appearance of bilateral occipital lobe and bilateral cerebellar infarcts. Moderate low-density changes within the periventricular and subcortical white matter compatible with chronic microvascular ischemic change. Moderate diffuse cerebral volume loss. Vascular: Atherosclerotic calcifications involving the large vessels of the skull base. No unexpected hyperdense vessel. Skull: Normal. Negative for fracture or focal lesion. Sinuses/Orbits: No acute finding. Other: None. IMPRESSION: 1. No acute intracranial findings. 2. Stable appearance of bilateral occipital lobe and bilateral cerebellar infarcts. 3. Chronic microvascular ischemic change and cerebral volume loss. Electronically Signed   By: Lydia Poke D.O.   On: 11/22/2019 10:07   DG Knee Complete 4 Views Right  Result Date: 11/22/2019 CLINICAL DATA:  Pain post fall EXAM: RIGHT KNEE - COMPLETE 4+ VIEW COMPARISON:  August 03, 2019 FINDINGS: Right knee arthroplasty and partially imaged right femoral plate and screw fixation. Appearance is similar. Prior periprosthetic fracture is again identified and is also similar in appearance. No definite new fracture. IMPRESSION: No change in appearance of right knee arthroplasty and periprosthetic fracture. No definite new fracture. Electronically Signed   By: Lydia Mis M.D.   On: 11/22/2019 09:47   ____________________________________________   PROCEDURES  Procedures  ____________________________________________   INITIAL IMPRESSION / ASSESSMENT AND PLAN / ED COURSE  SEINI LANNOM is a 86 y.o. who presents to the emergency department for evaluation after sustaining a fall last night about 915.  See HPI for further details.  Daughter at bedside states that she has  some difficulty with turning due to to Charcot, but has been  working with PT and had started to become more stable prior to last nights fall.  Exam is overall reassuring.  Plan will be to get x-rays of the knee.  She is currently on blood thinners and therefore will also have a CT of her head.  She does complain of a headache without nausea or visual changes.  She does have a history of migraines and states that this may be related to caffeine withdrawal since she has not had her coffee this morning.  CT of the head and image of the right knee are without acute findings.  Patient will be discharged back to [place].  Daughter states that she will need to return with EMS.  Awaiting transport.   Medications  acetaminophen (TYLENOL) tablet 650 mg (650 mg Oral Given 11/22/19 1028)    Pertinent labs & imaging results that were available during my care of the patient were reviewed by me and considered in my medical decision making (see chart for details).   _________________________________________   FINAL CLINICAL IMPRESSION(S) / ED DIAGNOSES  Final diagnoses:  Acute intractable headache, unspecified headache type  Acute pain of right knee    ED Discharge Orders    None       If controlled substance prescribed during this visit, 12 month history viewed on the [place] prior to issuing an initial prescription for Schedule II or III opiod.   Victorino Dike, FNP 11/22/19 1541    Lucrezia Starch, MD 11/22/19 [phone]

## 2019-11-22 NOTE — ED Triage Notes (Signed)
Patient from [facility] with c/o of s/p fall last night, paint to right knee this morning. Patient able to ambulate but hurts in her knee when she does. Small bruise noted to right knee. No other c/o's. Patient aox4

## 2019-11-23 DIAGNOSIS — M6281 Muscle weakness (generalized): Secondary | ICD-10-CM | POA: Diagnosis not present

## 2019-11-23 DIAGNOSIS — G600 Hereditary motor and sensory neuropathy: Secondary | ICD-10-CM | POA: Diagnosis not present

## 2019-11-23 DIAGNOSIS — R278 Other lack of coordination: Secondary | ICD-10-CM | POA: Diagnosis not present

## 2019-11-24 ENCOUNTER — Ambulatory Visit (INDEPENDENT_AMBULATORY_CARE_PROVIDER_SITE_OTHER): Payer: Medicare Other | Admitting: Podiatry

## 2019-11-24 ENCOUNTER — Other Ambulatory Visit: Payer: Self-pay

## 2019-11-24 ENCOUNTER — Encounter: Payer: Self-pay | Admitting: Podiatry

## 2019-11-24 DIAGNOSIS — E70319 Ocular albinism, unspecified: Secondary | ICD-10-CM | POA: Diagnosis not present

## 2019-11-24 DIAGNOSIS — I2510 Atherosclerotic heart disease of native coronary artery without angina pectoris: Secondary | ICD-10-CM | POA: Diagnosis not present

## 2019-11-24 DIAGNOSIS — M25561 Pain in right knee: Secondary | ICD-10-CM | POA: Diagnosis not present

## 2019-11-24 DIAGNOSIS — Z7984 Long term (current) use of oral hypoglycemic drugs: Secondary | ICD-10-CM | POA: Diagnosis not present

## 2019-11-24 DIAGNOSIS — E7849 Other hyperlipidemia: Secondary | ICD-10-CM | POA: Diagnosis not present

## 2019-11-24 DIAGNOSIS — M79675 Pain in left toe(s): Secondary | ICD-10-CM | POA: Diagnosis not present

## 2019-11-24 DIAGNOSIS — S72401A Unspecified fracture of lower end of right femur, initial encounter for closed fracture: Secondary | ICD-10-CM | POA: Diagnosis not present

## 2019-11-24 DIAGNOSIS — Z7982 Long term (current) use of aspirin: Secondary | ICD-10-CM | POA: Diagnosis not present

## 2019-11-24 DIAGNOSIS — I252 Old myocardial infarction: Secondary | ICD-10-CM | POA: Diagnosis not present

## 2019-11-24 DIAGNOSIS — M1611 Unilateral primary osteoarthritis, right hip: Secondary | ICD-10-CM | POA: Diagnosis not present

## 2019-11-24 DIAGNOSIS — I708 Atherosclerosis of other arteries: Secondary | ICD-10-CM | POA: Diagnosis not present

## 2019-11-24 DIAGNOSIS — R278 Other lack of coordination: Secondary | ICD-10-CM | POA: Diagnosis not present

## 2019-11-24 DIAGNOSIS — W1839XD Other fall on same level, subsequent encounter: Secondary | ICD-10-CM | POA: Diagnosis not present

## 2019-11-24 DIAGNOSIS — E119 Type 2 diabetes mellitus without complications: Secondary | ICD-10-CM | POA: Diagnosis not present

## 2019-11-24 DIAGNOSIS — Z8781 Personal history of (healed) traumatic fracture: Secondary | ICD-10-CM | POA: Diagnosis not present

## 2019-11-24 DIAGNOSIS — G4733 Obstructive sleep apnea (adult) (pediatric): Secondary | ICD-10-CM | POA: Diagnosis not present

## 2019-11-24 DIAGNOSIS — M25571 Pain in right ankle and joints of right foot: Secondary | ICD-10-CM | POA: Diagnosis not present

## 2019-11-24 DIAGNOSIS — I739 Peripheral vascular disease, unspecified: Secondary | ICD-10-CM | POA: Diagnosis not present

## 2019-11-24 DIAGNOSIS — G319 Degenerative disease of nervous system, unspecified: Secondary | ICD-10-CM | POA: Diagnosis not present

## 2019-11-24 DIAGNOSIS — B351 Tinea unguium: Secondary | ICD-10-CM | POA: Diagnosis not present

## 2019-11-24 DIAGNOSIS — Z20822 Contact with and (suspected) exposure to covid-19: Secondary | ICD-10-CM | POA: Diagnosis not present

## 2019-11-24 DIAGNOSIS — M79674 Pain in right toe(s): Secondary | ICD-10-CM

## 2019-11-24 DIAGNOSIS — J45998 Other asthma: Secondary | ICD-10-CM | POA: Diagnosis not present

## 2019-11-24 DIAGNOSIS — I10 Essential (primary) hypertension: Secondary | ICD-10-CM | POA: Diagnosis not present

## 2019-11-24 DIAGNOSIS — I350 Nonrheumatic aortic (valve) stenosis: Secondary | ICD-10-CM | POA: Diagnosis not present

## 2019-11-24 DIAGNOSIS — M4312 Spondylolisthesis, cervical region: Secondary | ICD-10-CM | POA: Diagnosis not present

## 2019-11-24 DIAGNOSIS — S72401G Unspecified fracture of lower end of right femur, subsequent encounter for closed fracture with delayed healing: Secondary | ICD-10-CM | POA: Diagnosis not present

## 2019-11-24 DIAGNOSIS — Z79899 Other long term (current) drug therapy: Secondary | ICD-10-CM | POA: Diagnosis not present

## 2019-11-24 DIAGNOSIS — I6389 Other cerebral infarction: Secondary | ICD-10-CM | POA: Diagnosis not present

## 2019-11-24 DIAGNOSIS — J9611 Chronic respiratory failure with hypoxia: Secondary | ICD-10-CM | POA: Diagnosis not present

## 2019-11-24 DIAGNOSIS — I5040 Unspecified combined systolic (congestive) and diastolic (congestive) heart failure: Secondary | ICD-10-CM | POA: Diagnosis not present

## 2019-11-24 DIAGNOSIS — D649 Anemia, unspecified: Secondary | ICD-10-CM | POA: Diagnosis not present

## 2019-11-24 DIAGNOSIS — M2578 Osteophyte, vertebrae: Secondary | ICD-10-CM | POA: Diagnosis not present

## 2019-11-24 DIAGNOSIS — W01198A Fall on same level from slipping, tripping and stumbling with subsequent striking against other object, initial encounter: Secondary | ICD-10-CM | POA: Diagnosis not present

## 2019-11-24 DIAGNOSIS — I509 Heart failure, unspecified: Secondary | ICD-10-CM | POA: Diagnosis not present

## 2019-11-24 DIAGNOSIS — M9711XD Periprosthetic fracture around internal prosthetic right knee joint, subsequent encounter: Secondary | ICD-10-CM | POA: Diagnosis not present

## 2019-11-24 DIAGNOSIS — S72402D Unspecified fracture of lower end of left femur, subsequent encounter for closed fracture with routine healing: Secondary | ICD-10-CM | POA: Diagnosis not present

## 2019-11-24 DIAGNOSIS — M25511 Pain in right shoulder: Secondary | ICD-10-CM | POA: Diagnosis not present

## 2019-11-24 DIAGNOSIS — S0012XA Contusion of left eyelid and periocular area, initial encounter: Secondary | ICD-10-CM | POA: Diagnosis not present

## 2019-11-24 DIAGNOSIS — M25562 Pain in left knee: Secondary | ICD-10-CM | POA: Diagnosis not present

## 2019-11-24 DIAGNOSIS — M8580 Other specified disorders of bone density and structure, unspecified site: Secondary | ICD-10-CM | POA: Diagnosis not present

## 2019-11-24 DIAGNOSIS — I340 Nonrheumatic mitral (valve) insufficiency: Secondary | ICD-10-CM | POA: Diagnosis not present

## 2019-11-24 DIAGNOSIS — Z9049 Acquired absence of other specified parts of digestive tract: Secondary | ICD-10-CM | POA: Diagnosis not present

## 2019-11-24 DIAGNOSIS — U071 COVID-19: Secondary | ICD-10-CM | POA: Diagnosis not present

## 2019-11-24 DIAGNOSIS — Z96653 Presence of artificial knee joint, bilateral: Secondary | ICD-10-CM | POA: Diagnosis not present

## 2019-11-24 DIAGNOSIS — W1839XA Other fall on same level, initial encounter: Secondary | ICD-10-CM | POA: Diagnosis not present

## 2019-11-24 DIAGNOSIS — M21371 Foot drop, right foot: Secondary | ICD-10-CM | POA: Diagnosis not present

## 2019-11-24 DIAGNOSIS — I69398 Other sequelae of cerebral infarction: Secondary | ICD-10-CM | POA: Diagnosis not present

## 2019-11-24 DIAGNOSIS — I110 Hypertensive heart disease with heart failure: Secondary | ICD-10-CM | POA: Diagnosis not present

## 2019-11-24 DIAGNOSIS — E1169 Type 2 diabetes mellitus with other specified complication: Secondary | ICD-10-CM | POA: Diagnosis not present

## 2019-11-24 DIAGNOSIS — M25531 Pain in right wrist: Secondary | ICD-10-CM | POA: Diagnosis not present

## 2019-11-24 DIAGNOSIS — M542 Cervicalgia: Secondary | ICD-10-CM | POA: Diagnosis not present

## 2019-11-24 DIAGNOSIS — M1990 Unspecified osteoarthritis, unspecified site: Secondary | ICD-10-CM | POA: Diagnosis not present

## 2019-11-24 DIAGNOSIS — K219 Gastro-esophageal reflux disease without esophagitis: Secondary | ICD-10-CM | POA: Diagnosis not present

## 2019-11-24 DIAGNOSIS — Z515 Encounter for palliative care: Secondary | ICD-10-CM | POA: Diagnosis not present

## 2019-11-24 DIAGNOSIS — R109 Unspecified abdominal pain: Secondary | ICD-10-CM | POA: Diagnosis not present

## 2019-11-24 DIAGNOSIS — G600 Hereditary motor and sensory neuropathy: Secondary | ICD-10-CM | POA: Diagnosis not present

## 2019-11-24 DIAGNOSIS — M47812 Spondylosis without myelopathy or radiculopathy, cervical region: Secondary | ICD-10-CM | POA: Diagnosis not present

## 2019-11-24 DIAGNOSIS — Z981 Arthrodesis status: Secondary | ICD-10-CM | POA: Diagnosis not present

## 2019-11-24 DIAGNOSIS — I5032 Chronic diastolic (congestive) heart failure: Secondary | ICD-10-CM | POA: Diagnosis not present

## 2019-11-24 DIAGNOSIS — G43909 Migraine, unspecified, not intractable, without status migrainosus: Secondary | ICD-10-CM | POA: Diagnosis not present

## 2019-11-24 DIAGNOSIS — Z6829 Body mass index (BMI) 29.0-29.9, adult: Secondary | ICD-10-CM | POA: Diagnosis not present

## 2019-11-24 DIAGNOSIS — E785 Hyperlipidemia, unspecified: Secondary | ICD-10-CM | POA: Diagnosis not present

## 2019-11-24 DIAGNOSIS — I25118 Atherosclerotic heart disease of native coronary artery with other forms of angina pectoris: Secondary | ICD-10-CM | POA: Diagnosis not present

## 2019-11-24 DIAGNOSIS — J45909 Unspecified asthma, uncomplicated: Secondary | ICD-10-CM | POA: Diagnosis not present

## 2019-11-24 DIAGNOSIS — S0990XA Unspecified injury of head, initial encounter: Secondary | ICD-10-CM | POA: Diagnosis not present

## 2019-11-24 DIAGNOSIS — Z86718 Personal history of other venous thrombosis and embolism: Secondary | ICD-10-CM | POA: Diagnosis not present

## 2019-11-24 DIAGNOSIS — E43 Unspecified severe protein-calorie malnutrition: Secondary | ICD-10-CM | POA: Diagnosis not present

## 2019-11-24 DIAGNOSIS — M21372 Foot drop, left foot: Secondary | ICD-10-CM | POA: Diagnosis not present

## 2019-11-24 DIAGNOSIS — W050XXA Fall from non-moving wheelchair, initial encounter: Secondary | ICD-10-CM | POA: Diagnosis not present

## 2019-11-24 DIAGNOSIS — M6281 Muscle weakness (generalized): Secondary | ICD-10-CM | POA: Diagnosis not present

## 2019-11-24 NOTE — Progress Notes (Signed)
This patient presents  to my office for at risk foot care.  This patient requires this care by a professional since this patient will be at risk due to having diabetes mellitus  This patient is unable to cut nails herself since the patient cannot reach her nails. These nails are painful walking and wearing shoes. She is accompanied by her daughter. This patient presents for at risk foot care today. She recently had knee injury.  General Appearance  Alert, conversant and in no acute stress.  Vascular  Dorsalis pedis   pulses are palpable  bilaterally. Posterior tibial pulses are absent. Capillary return is within normal limits  bilaterally. Temperature is within normal limits  bilaterally.  Neurologic  Senn-Weinstein monofilament wire test within normal limits  bilaterally. Muscle power within normal limits bilaterally.  Nails Thick disfigured discolored nails with subungual debris  Hallux  nails bilaterally. No evidence of bacterial infection or drainage bilaterally.  Orthopedic  No limitations of motion  feet .  No crepitus or effusions noted.  No bony pathology or digital deformities noted.  Skin  normotropic skin with no porokeratosis noted bilaterally.  No signs of infections or ulcers noted.     Onychomycosis  Pain in right toes  Pain in left toes  Consent was obtained for treatment procedures.   Mechanical debridement of hallux  Nails  bilaterally performed with a nail nipper.  Filed with dremel without incident.    Return office visit   prn                  Told patient to return for periodic foot care and evaluation due to potential at risk complications.   Gardiner Barefoot DPM

## 2019-11-30 ENCOUNTER — Non-Acute Institutional Stay: Payer: Medicare Other | Admitting: Nurse Practitioner

## 2019-11-30 ENCOUNTER — Encounter: Payer: Self-pay | Admitting: Nurse Practitioner

## 2019-11-30 ENCOUNTER — Other Ambulatory Visit: Payer: Self-pay

## 2019-11-30 DIAGNOSIS — Z515 Encounter for palliative care: Secondary | ICD-10-CM

## 2019-11-30 DIAGNOSIS — I5032 Chronic diastolic (congestive) heart failure: Secondary | ICD-10-CM

## 2019-11-30 NOTE — Progress Notes (Signed)
[facility] Pall[facility] Consult Note Telephone: [phone]  Fax: [phone]  PATIENT NAME: Lydia King DOB: [birth year] MRN: [id number] PRIMARY CARE PROVIDER:Dr Hodges/ [facility]\  Responsible party:Lydia King daughter [phone] or [phone]  RECOMMENDATIONS and PLAN: 1.Advance Care Planning; DNR in [place]  2.Goals of Care: Goals include to maximize quality of life and symptom management. Our advance care planning conversation included a discussion about:   The value and importance of advance care planning  Exploration of personal, cultural or spiritual beliefs that might influence medical decisions  Exploration of goals of care in the event of a sudden injury or illness  Identification and preparation of a healthcare agent  Review and updating or creation of anadvance directive document.  3.Palliative care encounter; Palliative care encounter; Palliative medicine team will continue to support patient, patient's family, and medical team. Visit consisted of counseling and education dealing with the complex and emotionally intense issues of symptom management and palliative care in the setting of serious and potentially life-threatening illness  4. f/u4 weeks for ongoing monitoring str, progression, planning for d/c plans or transition to LTC with family, goc  I spent 70  minutes providing this consultation, started 11:00am. More than 50% of the time in this consultation was spent coordinating communication.   HISTORY OF PRESENT ILLNESS:  Lydia King is a 86 y.o. year old female with multiple medical problems including chronic HF (EF 55% in July 2021), CAD with 2 stents placed, OSA with CPAP use, Asthma, DM2, Osteopenia,Osteoarthritis multiple joints and history of CVA with cortical blindness. Hospitalized 10 / 6 / 2021 to 10 / 8 / 2021 for chest pain with unstable angina with work up significant  for a typical chest pain in the setting of large paraesophageal hernia. Cardiology consultants no plans for cardiac catheterization list symptoms reoccur increased imdur to bid change po coreg. Continue PPI. On aspirin and plavix. Ms Wolfgang was discharged back to [facility] where she currently resides. Ms Sica does transfer, is mobile short distances in the room. Ms Bowne does require assistance for adl's bathing, dressing. Ms Severs does feed herself with current weight 147.3 lb. Weight stable. BMI 29.7. Diabetic diet, regular texture, regular liquids consistency. Staff endorses no other changes are concerns. At present Ms Gunia is lying in bed asleep. Ms Kingsford woke to verbal cues, appears comfortable making eye contact. Ms Bo was very interactive. We talked about how she was feeling today. Ms. Kreeger talked about her trip to the emergency department for her right knee pain after a fall where it had some bruising. We talked about walking with her walker. Ms Santarelli endorses that she has always had trouble with falling throughout our life. We talked about fall precautions and importance of using her walker. We talked about symptoms of pain what she does experience. Currently Ms Pollina is getting tylenol for mild to moderate pain and tramadol for more severe. Ms Melena endorses she is hoping that her tramadol can possibly be scheduled. We talked about how often she takes her trim at all which is not daily. We talked about alternative pain modalities. We talked about pain in her lower back which has been chronic. We talked about coping strategies. We talked about Ms Goyal appetite. We talked about the challenges she experiences at the facility. We talked about role of Palliative care and plan of care. Medical goals review. Discuss with Ms Fodera contact [name] her daughter for update on palliative  care visit and further discussion of pain. Ms Bost in  agreement. Therapeutic listening, emotional support provided. Questions answered to satisfaction. I updated nursing staff no changes at present time but nursing recommended a Lidoderm patch and will follow up with Berenice Bouton PA for and [name], Ms Loudon is daughter about that option also.  Palliative Care was asked to help to continue to address goals of care.   CODE STATUS: DNR  PPS: 50% HOSPICE ELIGIBILITY/DIAGNOSIS: TBD  PAST MEDICAL HISTORY:  Past Medical History:  Diagnosis Date  . (HFpEF) heart failure with preserved ejection fraction ([place])    a. 06/2019 Echo ([facility]): EF >55%, mild conc LVH. Mild-mod AS, mild MR/TR.  Marland Kitchen Anemia    in past (from blood loss at hiatal hernia)  . Asthma   . CAD (coronary artery disease)    a. 05/2019 MI/PCI: ost LCX (rota + cba-->3.0x16 Synergy DES), LAD 90 (3.0x16 Synergy DES).  . Charcot-Marie-Tooth disease    walks with cane (fairly controlled)  . Diverticulosis   . DVT (deep venous thrombosis) ([place]) 2003   after sx  . GERD (gastroesophageal reflux disease)   . Grief reaction    on fluoxetine  . Headache(784.0)    migraine  . Hiatal hernia   . Hyperlipidemia   . Hypertension   . OA (ocular albinism) ([place])   . Osteopenia   . PUD (peptic ulcer disease)    gastric, past  . Shingles   . Stroke ([place]) 8/10   posterior reversible encephalopathy syndrome  . Urine incontinence     SOCIAL HX:  Social History   Tobacco Use  . Smoking status: Never Smoker  . Smokeless tobacco: Never Used  Substance Use Topics  . Alcohol use: No    ALLERGIES:  Allergies  Allergen Reactions  . Morphine And Related Anaphylaxis  . Other Shortness Of Breath and Other (See Comments)  . Shrimp [Shellfish Allergy] Anaphylaxis    Anaphylaxis-swelling of throat/sob    . Amlodipine Besylate     REACTION: edema  . Atorvastatin     REACTION: pain- could not walk  . Ciprofloxacin Other (See Comments)    REACTION: unkown Trouble breathing  . Influenza Vaccines  Other (See Comments)    Arm swelling, redness, chills, fever  . Iodinated Diagnostic Agents   . Nitrofurantoin   . Omeprazole     REACTION: not effective  . Pantoprazole Sodium     REACTION: does not work well  . Penicillin G Hives    ITCHY RASH  . Penicillins     REACTION: itchy rash  . Simvastatin     REACTION: leg pain  . Sulfonamide Derivatives     REACTION: itchy rash  . Tramadol Other (See Comments)    Took for years, but then told this was potential cause of prior strokes and she should not ever take again  . Codeine Rash    REACTION: itchy rash ITCHY RASH, also breathing trouble  . Prednisone Other (See Comments)    REACTION: RASH.// SHORTNESS OF BREATH Prior possible flushing reaction in face, but tolerated it in 2020 and 2021 up to 20mg      PHYSICAL EXAM:   General: NAD, frail appearing, pleasant female Cardiovascular: regular rate and rhythm Pulmonary: clear ant fields Neurological: walks with walker  Kyzer Blowe Ihor Gully, NP

## 2019-12-14 ENCOUNTER — Emergency Department: Payer: Medicare Other

## 2019-12-14 ENCOUNTER — Other Ambulatory Visit: Payer: Self-pay

## 2019-12-14 ENCOUNTER — Emergency Department
Admission: EM | Admit: 2019-12-14 | Discharge: 2019-12-14 | Disposition: A | Discharge status: Home / Self Care | Payer: Medicare Other | Attending: Emergency Medicine | Admitting: Emergency Medicine

## 2019-12-14 ENCOUNTER — Encounter: Payer: Self-pay | Admitting: Emergency Medicine

## 2019-12-14 DIAGNOSIS — M4312 Spondylolisthesis, cervical region: Secondary | ICD-10-CM | POA: Diagnosis not present

## 2019-12-14 DIAGNOSIS — I2510 Atherosclerotic heart disease of native coronary artery without angina pectoris: Secondary | ICD-10-CM | POA: Diagnosis not present

## 2019-12-14 DIAGNOSIS — M25571 Pain in right ankle and joints of right foot: Secondary | ICD-10-CM | POA: Insufficient documentation

## 2019-12-14 DIAGNOSIS — Z79899 Other long term (current) drug therapy: Secondary | ICD-10-CM | POA: Diagnosis not present

## 2019-12-14 DIAGNOSIS — W19XXXA Unspecified fall, initial encounter: Secondary | ICD-10-CM

## 2019-12-14 DIAGNOSIS — I110 Hypertensive heart disease with heart failure: Secondary | ICD-10-CM | POA: Insufficient documentation

## 2019-12-14 DIAGNOSIS — Z7984 Long term (current) use of oral hypoglycemic drugs: Secondary | ICD-10-CM | POA: Diagnosis not present

## 2019-12-14 DIAGNOSIS — M25561 Pain in right knee: Secondary | ICD-10-CM | POA: Diagnosis not present

## 2019-12-14 DIAGNOSIS — M25531 Pain in right wrist: Secondary | ICD-10-CM | POA: Diagnosis not present

## 2019-12-14 DIAGNOSIS — Z96653 Presence of artificial knee joint, bilateral: Secondary | ICD-10-CM | POA: Diagnosis not present

## 2019-12-14 DIAGNOSIS — I708 Atherosclerosis of other arteries: Secondary | ICD-10-CM | POA: Diagnosis not present

## 2019-12-14 DIAGNOSIS — I6389 Other cerebral infarction: Secondary | ICD-10-CM | POA: Diagnosis not present

## 2019-12-14 DIAGNOSIS — Z7982 Long term (current) use of aspirin: Secondary | ICD-10-CM | POA: Insufficient documentation

## 2019-12-14 DIAGNOSIS — M542 Cervicalgia: Secondary | ICD-10-CM | POA: Diagnosis not present

## 2019-12-14 DIAGNOSIS — M47812 Spondylosis without myelopathy or radiculopathy, cervical region: Secondary | ICD-10-CM | POA: Diagnosis not present

## 2019-12-14 DIAGNOSIS — W01198A Fall on same level from slipping, tripping and stumbling with subsequent striking against other object, initial encounter: Secondary | ICD-10-CM | POA: Insufficient documentation

## 2019-12-14 DIAGNOSIS — Z981 Arthrodesis status: Secondary | ICD-10-CM | POA: Diagnosis not present

## 2019-12-14 DIAGNOSIS — J45909 Unspecified asthma, uncomplicated: Secondary | ICD-10-CM | POA: Diagnosis not present

## 2019-12-14 DIAGNOSIS — G319 Degenerative disease of nervous system, unspecified: Secondary | ICD-10-CM | POA: Diagnosis not present

## 2019-12-14 DIAGNOSIS — S0990XA Unspecified injury of head, initial encounter: Secondary | ICD-10-CM | POA: Insufficient documentation

## 2019-12-14 DIAGNOSIS — I5032 Chronic diastolic (congestive) heart failure: Secondary | ICD-10-CM | POA: Diagnosis not present

## 2019-12-14 DIAGNOSIS — E1169 Type 2 diabetes mellitus with other specified complication: Secondary | ICD-10-CM | POA: Insufficient documentation

## 2019-12-14 DIAGNOSIS — M2578 Osteophyte, vertebrae: Secondary | ICD-10-CM | POA: Diagnosis not present

## 2019-12-14 DIAGNOSIS — I739 Peripheral vascular disease, unspecified: Secondary | ICD-10-CM | POA: Diagnosis not present

## 2019-12-14 MED ORDER — TRAMADOL HCL 50 MG PO TABS
50.0000 mg | ORAL_TABLET | Freq: Once | ORAL | Status: AC
  Administered 2019-12-14: 50 mg via ORAL
  Filled 2019-12-14: qty 1

## 2019-12-14 NOTE — ED Provider Notes (Signed)
Emergency Department Provider Note  ____________________________________________  Time seen: Approximately 4:34 PM  I have reviewed the triage vital signs and the nursing notes.   HISTORY  Chief Complaint Fall   Historian Patient    HPI Lydia King is a 86 y.o. female presents to the emergency department after a mechanical fall complaining of head injury, right wrist pain, right knee pain and right ankle pain.  Patient reportedly lost her balance while leaning to 1 side and could not correct herself.  She hit the right side of her head without loss of consciousness.  Patient does currently take a blood thinner.  She denies chest pain, chest tightness, shortness of breath or abdominal pain.   Past Medical History:  Diagnosis Date  . (HFpEF) heart failure with preserved ejection fraction ([place])    a. 06/2019 Echo ([facility]): EF >55%, mild conc LVH. Mild-mod AS, mild MR/TR.  Marland Kitchen Anemia    in past (from blood loss at hiatal hernia)  . Asthma   . CAD (coronary artery disease)    a. 05/2019 MI/PCI: ost LCX (rota + cba-->3.0x16 Synergy DES), LAD 90 (3.0x16 Synergy DES).  . Charcot-Marie-Tooth disease    walks with cane (fairly controlled)  . Diverticulosis   . DVT (deep venous thrombosis) ([place]) 2003   after sx  . GERD (gastroesophageal reflux disease)   . Grief reaction    on fluoxetine  . Headache(784.0)    migraine  . Hiatal hernia   . Hyperlipidemia   . Hypertension   . OA (ocular albinism) ([place])   . Osteopenia   . PUD (peptic ulcer disease)    gastric, past  . Shingles   . Stroke ([place]) 8/10   posterior reversible encephalopathy syndrome  . Urine incontinence      Immunizations up to date:  Yes.     Past Medical History:  Diagnosis Date  . (HFpEF) heart failure with preserved ejection fraction ([place])    a. 06/2019 Echo ([facility]): EF >55%, mild conc LVH. Mild-mod AS, mild MR/TR.  Marland Kitchen Anemia    in past (from blood loss at hiatal hernia)  . Asthma   . CAD (coronary  artery disease)    a. 05/2019 MI/PCI: ost LCX (rota + cba-->3.0x16 Synergy DES), LAD 90 (3.0x16 Synergy DES).  . Charcot-Marie-Tooth disease    walks with cane (fairly controlled)  . Diverticulosis   . DVT (deep venous thrombosis) ([place]) 2003   after sx  . GERD (gastroesophageal reflux disease)   . Grief reaction    on fluoxetine  . Headache(784.0)    migraine  . Hiatal hernia   . Hyperlipidemia   . Hypertension   . OA (ocular albinism) ([place])   . Osteopenia   . PUD (peptic ulcer disease)    gastric, past  . Shingles   . Stroke ([place]) 8/10   posterior reversible encephalopathy syndrome  . Urine incontinence     Patient Active Problem List   Diagnosis Date Noted  . Pain due to onychomycosis of toenails of both feet 11/24/2019  . Anxiety 11/09/2019  . Chronic diastolic CHF (congestive heart failure) ([place]) 11/03/2019  . Paraesophageal hiatal hernia   . Chest pain 11/02/2019  . CAD (coronary artery disease) 08/01/2019  . Anemia 08/01/2019  . Acute on chronic congestive heart failure ([place]) 07/31/2019  . Acute decompensated heart failure ([place]) 07/03/2019  . Bradycardia 06/17/2019  . Hypoalbuminemia 06/17/2019  . Severe protein-calorie malnutrition ([place]) 06/17/2019  . Aortic valve stenosis 06/15/2019  . Mitral valve  regurgitation 06/15/2019  . Positive cardiac stress test 06/13/2019  . Closed fracture of right distal femur ([place]) 03/15/2019  . Periprosthetic fracture around internal prosthetic right knee joint 03/15/2019  . Hyperlipidemia associated with type 2 diabetes mellitus ([place]) 07/27/2018  . Sinus congestion 02/05/2018  . Vertigo 05/06/2017  . OSA (obstructive sleep apnea) 01/02/2016  . Pedal edema 01/02/2016  . Fall in home 12/11/2015  . Poor balance 12/11/2015  . Low back pain with sciatica 10/02/2015  . Right hip pain 10/02/2015  . Hearing loss 04/18/2015  . Encounter for Medicare annual wellness exam 10/04/2013  . Type 2 diabetes mellitus without complications  ([place]) 68/34/1962  . History of CVA (cerebrovascular accident) 09/12/2008  . Osteoarthritis of multiple joints 05/30/2008  . Osteoporosis 05/30/2008  . Vitamin D deficiency 07/14/2007  . Sleep apnea 07/14/2007  . Essential hypertension 11/30/2006  . Asthma 11/30/2006  . H/O gastroesophageal reflux (GERD) 11/30/2006  . G I BLEED 11/30/2006  . DEEP VENOUS THROMBOPHLEBITIS, HX OF 11/30/2006    Past Surgical History:  Procedure Laterality Date  . carotid dopplers  8/10  . COLONOSCOPY  2002  . HERNIA REPAIR  2002   bleeding  . HIATAL HERNIA REPAIR  2000   needed blood transfusion  . Stroke  8/10   hosp stroke/ posterior (vs posterior reversible encephalopathy syndrome) - d/c on Plavix  . TEE normal  2/29   no emoblic source  . TOTAL KNEE ARTHROPLASTY  03,06   Bilateral  . VESICOVAGINAL FISTULA CLOSURE W/ TAH  1964   with bleeding and cervical dysplasia    Prior to Admission medications   Medication Sig Start Date End Date Taking? Authorizing Provider  acetaminophen (TYLENOL) 500 MG tablet Take 2 tablets (1,000 mg total) by mouth in the morning, at noon, and at bedtime. 11/04/19   Fritzi Mandes, MD  alum & mag hydroxide-simeth (MAALOX/MYLANTA) 200-200-20 MG/5ML suspension Take 30 mLs by mouth in the morning, at noon, and at bedtime.    [provider]  aspirin EC 81 MG EC tablet Take 1 tablet (81 mg total) by mouth daily. Swallow whole. 08/05/19   Bonnielee Haff, MD  azelastine (ASTELIN) 0.1 % nasal spray Place 1 spray into both nostrils 2 (two) times daily. Use in each nostril as directed 08/26/17   Cuthriell, Charline Bills, PA-C  Blood Glucose Monitoring Suppl (FREESTYLE LITE) DEVI Use to check blood glucose daily and as needed for diabetes 10/11/17   Tower, Wynelle Fanny, MD  Calcium Carbonate-Vitamin D 600-400 MG-UNIT tablet Take 1 tablet by mouth daily with breakfast.     [provider]  carvedilol (COREG) 6.25 MG tablet Take 1 tablet (6.25 mg total) by mouth 2 (two) times  daily with a meal. 11/04/19   Fritzi Mandes, MD  Cholecalciferol (VITAMIN D) 2000 UNITS CAPS Take 2 capsules by mouth daily.      [provider]  cloNIDine (CATAPRES) 0.1 MG tablet Take 0.1 mg by mouth 3 (three) times daily as needed.    [provider]  clopidogrel (PLAVIX) 75 MG tablet TAKE 1 TABLET BY MOUTH EVERY DAY Patient taking differently: Take 75 mg by mouth daily.  07/26/19   Tower, Wynelle Fanny, MD  cyanocobalamin 1000 MCG tablet Take 1,000 mcg by mouth daily.    [provider]  esomeprazole (NEXIUM) 40 MG capsule Take 1 capsule (40 mg total) by mouth daily before breakfast. 07/27/18   Tower, Wynelle Fanny, MD  famotidine (PEPCID) 20 MG tablet Take 20 mg by mouth  at bedtime.    [provider]  Flaxseed, Linseed, (FLAX SEED OIL) 1000 MG CAPS Take 1 capsule by mouth daily.      [provider]  FLUoxetine (PROZAC) 20 MG capsule Take 20 mg by mouth daily. 11/20/19   [provider]  FLUoxetine (PROZAC) 40 MG capsule Take 1 capsule (40 mg total) by mouth daily. 11/09/19   Tower, Wynelle Fanny, MD  furosemide (LASIX) 40 MG tablet Take 1 tablet (40 mg total) by mouth daily. 11/09/19   Tower, Marne A, MD  glucose blood (FREESTYLE LITE) test strip Use to check blood sugar once daily for DM (dx. D40.814) 11/08/18   Tower, Wynelle Fanny, MD  isosorbide mononitrate (IMDUR) 30 MG 24 hr tablet Take 1 tablet (30 mg total) by mouth 2 (two) times daily. 11/04/19 02/02/20  Fritzi Mandes, MD  Lancets (FREESTYLE) lancets Use as instructedUse to check blood sugar once daily for DM (dx. E11.618) 11/08/18   Tower, Wynelle Fanny, MD  lidocaine (LMX) 4 % cream Apply 1 application topically in the morning and at bedtime. To low back and shoulder areas 11/11/19   Tower, Marion A, MD  lisinopril (ZESTRIL) 20 MG tablet Take 1 tablet (20 mg total) by mouth daily. 11/09/19   Tower, Wynelle Fanny, MD  loperamide (IMODIUM) 2 MG capsule Take 2 mg by mouth as needed for diarrhea or loose stools.    [provider]  loratadine (CLARITIN) 10 MG tablet TAKE 1 TABLET BY MOUTH EVERY DAY Patient taking differently: Take 10 mg by mouth daily.  02/28/19   Tower, Wynelle Fanny, MD  melatonin 3 MG TABS tablet Take 3 mg by mouth at bedtime.    [provider]  metFORMIN (GLUCOPHAGE XR) 500 MG 24 hr tablet Take 1 tablet (500 mg total) by mouth daily with breakfast. 11/04/19 11/03/20  Fritzi Mandes, MD  Multiple Vitamins-Minerals (PRESERVISION AREDS 2) CAPS Give one pill by mouth bid 11/09/19   Tower, Wynelle Fanny, MD  nitroGLYCERIN (NITROSTAT) 0.4 MG SL tablet Place under the tongue. 06/19/19 06/18/20  [provider]  omega-3 acid ethyl esters (LOVAZA) 1 g capsule TAKE 1 CAPSULE (1 G TOTAL) BY MOUTH DAILY. Patient taking differently: Take 1 g by mouth 2 (two) times daily.  07/26/19   Tower, Wynelle Fanny, MD  ondansetron (ZOFRAN) 4 MG tablet Take 4 mg by mouth every 4 (four) hours as needed for nausea or vomiting.    [provider]  polyethylene glycol (MIRALAX / GLYCOLAX) 17 g packet Take 17 g by mouth daily. 11/11/19   Tower, Wynelle Fanny, MD  PROAIR HFA 108 [phone] Base) MCG/ACT inhaler INHALE 2 PUFFS INTO THE LUNGS EVERY 4 (FOUR) HOURS AS NEEDED FOR WHEEZING OR SHORTNESS OF BREATH. 03/05/16   Tower, Wynelle Fanny, MD  rosuvastatin (CRESTOR) 20 MG tablet TAKE 1 TABLET BY MOUTH EVERYDAY AT BEDTIME Patient taking differently: Take 20 mg by mouth at bedtime. TAKE 1 TABLET BY MOUTH EVERYDAY AT BEDTIME 11/04/19   Fritzi Mandes, MD  sennosides-docusate sodium (SENOKOT-S) 8.6-50 MG tablet Take 2 tablets by mouth daily.    [provider]  traMADol (ULTRAM) 50 MG tablet Take 1 tablet (50 mg total) by mouth every 6 (six) hours as needed for moderate pain. 08/04/19   Bonnielee Haff, MD  trolamine salicylate (ASPERCREME/ALOE) 10 % cream Apply 1 application topically daily. To shoulder areas 11/11/19   Tower, Wynelle Fanny, MD    Allergies Morphine and related, Other, Shrimp [shellfish allergy], Amlodipine besylate,  Atorvastatin,  Ciprofloxacin, Influenza vaccines, Iodinated diagnostic agents, Nitrofurantoin, Omeprazole, Pantoprazole sodium, Penicillin g, Penicillins, Simvastatin, Sulfonamide derivatives, Tramadol, Codeine, and Prednisone  Family History  Problem Relation Age of Onset  . Cerebral aneurysm Son   . Cerebral aneurysm Father   . Hyperlipidemia Father   . Alcohol abuse Father   . Stroke Mother   . Cervical cancer Sister   . Hyperlipidemia Brother   . Alcohol abuse Brother   . Stroke Brother   . Diabetes Brother   . Diabetes Sister   . Sarcoidosis Sister   . Other Sister        OP/ Broken hip  . Other Other        whole family has charcot marie tooth  . Ovarian cancer Sister     Social History Social History   Tobacco Use  . Smoking status: Never Smoker  . Smokeless tobacco: Never Used  Vaping Use  . Vaping Use: Never used  Substance Use Topics  . Alcohol use: No  . Drug use: No     Review of Systems  Constitutional: No fever/chills Eyes:  No discharge ENT: No upper respiratory complaints. Respiratory: no cough. No SOB/ use of accessory muscles to breath Gastrointestinal:   No nausea, no vomiting.  No diarrhea.  No constipation. Musculoskeletal: Patient has right knee pain, right ankle pain and right wrist pain.  Skin: Negative for rash, abrasions, lacerations, ecchymosis.   ____________________________________________   PHYSICAL EXAM:  VITAL SIGNS: ED Triage Vitals  Enc Vitals Group     BP 12/14/19 1336 (!) 157/71     Pulse Rate 12/14/19 1336 73     Resp 12/14/19 1336 18     Temp 12/14/19 1336 98.2 F (36.8 C)     Temp Source 12/14/19 1336 Oral     SpO2 12/14/19 1336 98 %     Weight 12/14/19 1339 157 lb (71.2 kg)     Height 12/14/19 1339 4\' 11"  (1.499 m)     Head Circumference --      Peak Flow --      Pain Score 12/14/19 1338 8     Pain Loc --      Pain Edu? --      Excl. in [place]? --      Constitutional: Alert and oriented. Well appearing and in  no acute distress. Eyes: Conjunctivae are normal. PERRL. EOMI. Head: Atraumatic. ENT:      Nose: No congestion/rhinnorhea.      Mouth/Throat: Mucous membranes are moist.  Neck: No stridor.  Full range of motion. Cardiovascular: Normal rate, regular rhythm. Normal S1 and S2.  Good peripheral circulation. Respiratory: Normal respiratory effort without tachypnea or retractions. Lungs CTAB. Good air entry to the bases with no decreased or absent breath sounds Gastrointestinal: Bowel sounds x 4 quadrants. Soft and nontender to palpation. No guarding or rigidity. No distention. Musculoskeletal: Patient has symmetric strength in the upper and lower extremities.  She has full range of motion at the right wrist, right knee and right ankle.  Palpable dorsalis pedis pulse bilaterally and symmetrically. Neurologic:  Normal for age. No gross focal neurologic deficits are appreciated.  Skin: No abrasions, ecchymosis or lacerations. Psychiatric: Mood and affect are normal for age. Speech and behavior are normal.   ____________________________________________   LABS (all labs ordered are listed, but only abnormal results are displayed)  Labs Reviewed - No data to display ____________________________________________  EKG   ____________________________________________  RADIOLOGY Unk Pinto, personally viewed and evaluated these  images (plain radiographs) as part of my medical decision making, as well as reviewing the written report by the radiologist.  DG Wrist Complete Right  Result Date: 12/14/2019 CLINICAL DATA:  [place] today with wrist pain EXAM: RIGHT WRIST - COMPLETE 3+ VIEW COMPARISON:  12/27/2008 FINDINGS: No acute traumatic finding. End-stage degenerative arthropathy the radiocarpal joint, carpus and carpometacarpal joints with joint space narrowing, intra osseous cysts, and subsidence of the navicular into the distal radius. IMPRESSION: No acute traumatic finding. End-stage  degenerative arthropathy. Electronically Signed   By: Nelson Chimes M.D.   On: 12/14/2019 14:57   DG Ankle Complete Right  Result Date: 12/14/2019 CLINICAL DATA:  [place] today.  Ankle pain. EXAM: RIGHT ANKLE - COMPLETE 3+ VIEW COMPARISON:  03/14/2019 FINDINGS: Chronic hindfoot fusion. No sign of hardware change or complication. No acute fracture discernible. IMPRESSION: No acute or traumatic finding. Chronic hindfoot fusion. Electronically Signed   By: Nelson Chimes M.D.   On: 12/14/2019 14:58   CT Head Wo Contrast  Result Date: 12/14/2019 CLINICAL DATA:  Pain following fall EXAM: CT HEAD WITHOUT CONTRAST TECHNIQUE: Contiguous axial images were obtained from the base of the skull through the vertex without intravenous contrast. COMPARISON:  November 22, 2019 FINDINGS: Brain: Mild diffuse atrophy is stable. The left lateral ventricle is larger than the right lateral ventricle, a stable finding of questionable significance. There is no intracranial mass, hemorrhage, extra-axial fluid collection, or midline shift. Prior infarcts in each medial occipital lobe as well as bilateral cerebellar infarcts in the mid cerebellar regions posteriorly and on the left more anteriorly and superiorly are noted and stable. There is periventricular small vessel disease in the centra semiovale bilaterally. No acute infarct is demonstrable. Vascular: No hyperdense vessel. Calcification in each carotid siphon and distal vertebral artery region noted. Skull: Bony calvarium appears intact. Sinuses/Orbits: There is mucosal thickening in several ethmoid air cells. Orbits appear symmetric bilaterally. Other: Mastoid air cells are clear. IMPRESSION: Stable atrophy with prior infarcts in each posteromedial occipital lobe and in each cerebellar hemisphere, more extensive on the left than on the right. Periventricular small vessel disease is stable. No acute infarct evident. No mass or hemorrhage. There are foci of arterial vascular  calcification. There is mucosal thickening in several ethmoid air cells. Electronically Signed   By: Lowella Grip III M.D.   On: 12/14/2019 14:21   CT Cervical Spine Wo Contrast  Result Date: 12/14/2019 CLINICAL DATA:  Fall pain EXAM: CT CERVICAL SPINE WITHOUT CONTRAST TECHNIQUE: Multidetector CT imaging of the cervical spine was performed without intravenous contrast. Multiplanar CT image reconstructions were also generated. COMPARISON:  None. FINDINGS: Alignment: There is a minimal anterolisthesis C3 on C4. Skull base and vertebrae: Visualized skull base is intact. No atlanto-occipital dissociation. The vertebral body heights are well maintained. No fracture or pathologic osseous lesion seen. Soft tissues and spinal canal: The visualized paraspinal soft tissues are unremarkable. No prevertebral soft tissue swelling is seen. The spinal canal is grossly unremarkable, no large epidural collection or significant canal narrowing. Disc levels: Multilevel cervical spine spondylosis is seen with disc height disc osteophyte and uncovertebral osteophytes. This is most notable C4-C5 and C5-C6 with moderate neural foraminal narrowing and mild central stenosis. Upper chest: The lung apices are clear. Thoracic inlet is within normal limits. Scattered vascular calcifications are noted. Other: None IMPRESSION: No acute fracture or malalignment of the spine. Cervical spine spondylosis most notable from C4 through C6. Electronically Signed   By: Ebony Cargo.D.  On: 12/14/2019 15:48   DG Knee Complete 4 Views Right  Result Date: 12/14/2019 CLINICAL DATA:  Right knee pain after falling. EXAM: RIGHT KNEE - COMPLETE 4+ VIEW COMPARISON:  Radiographs 11/22/2019 FINDINGS: Status post right total knee arthroplasty and lateral plate and screw fixation of the distal femur. The lateral plate is incompletely visualized, although appears grossly unchanged. The mildly displaced fracture of the distal femur is unchanged  compared with the prior study. There is no evidence of new fracture or dislocation. IMPRESSION: Stable appearance of the distal right femur fracture status post total knee arthroplasty and lateral plate and screw fixation. No acute osseous findings. Electronically Signed   By: Richardean Sale M.D.   On: 12/14/2019 14:56    ____________________________________________    PROCEDURES  Procedure(s) performed:     Procedures     Medications  traMADol (ULTRAM) tablet 50 mg (has no administration in time range)     ____________________________________________   INITIAL IMPRESSION / ASSESSMENT AND PLAN / ED COURSE  Pertinent labs & imaging results that were available during my care of the patient were reviewed by me and considered in my medical decision making (see chart for details).      Assessment and plan Fall 86 year old female presents to the emergency department after a mechanical fall at home resulting in a right-sided head injury, right wrist discomfort, right knee discomfort and right ankle pain.  Patient was hypertensive at triage.  On physical exam, patient was alert, active and nontoxic-appearing.  She was requesting black coffee to drink and was accompanied by her daughter.  She endorsed soreness and requested tramadol for pain.  Differential diagnosis included intracranial bleed, skull fracture, C-spine fracture, fractures of the right ankle, right knee and right wrist...  There was no evidence of intracranial bleed, skull fracture or C-spine fracture on dedicated CTs.  No acute bony abnormalities visualized on x-rays of the right ankle, right knee and right wrist.  Patient has had prior distal right femur fracture and it appears unchanged on today's right knee x-rays according to radiologist.  Patient was given tramadol in the emergency department for pain.  Patient was requesting to go home.  Recommended continuing tramadol as directed at home for pain with  follow-up with Dr. Roland Rack as needed. ____________________________________________  FINAL CLINICAL IMPRESSION(S) / ED DIAGNOSES  Final diagnoses:  Fall, initial encounter      NEW MEDICATIONS STARTED DURING THIS VISIT:  ED Discharge Orders    None          This chart was dictated using voice recognition software/Dragon. Despite best efforts to proofread, errors can occur which can change the meaning. Any change was purely unintentional.     Lannie Fields, PA-C 12/14/19 1644    Blake Divine, MD 12/14/19 1659

## 2019-12-14 NOTE — Discharge Instructions (Signed)
You can continue to take tramadol at home for pain. Please return to the emergency department with new or worsening symptoms.

## 2019-12-14 NOTE — ED Triage Notes (Signed)
Pt comes into the ED via POV c/o fall this morning.  Pt states she lost her balance, leaned to the side and couldn't self-correct before falling.  Pt states she hit the right side of her head, denies any LOC, but patient is currently on blood thinners.  Pt also c/o right wrist pain, right knee, and right ankle pain.  Pt has no deformities noted to the extremities.

## 2019-12-19 DIAGNOSIS — E1169 Type 2 diabetes mellitus with other specified complication: Secondary | ICD-10-CM | POA: Diagnosis not present

## 2019-12-19 DIAGNOSIS — I509 Heart failure, unspecified: Secondary | ICD-10-CM | POA: Diagnosis not present

## 2019-12-19 DIAGNOSIS — E785 Hyperlipidemia, unspecified: Secondary | ICD-10-CM | POA: Diagnosis not present

## 2020-01-03 ENCOUNTER — Emergency Department: Payer: Medicare Other

## 2020-01-03 ENCOUNTER — Emergency Department
Admission: EM | Admit: 2020-01-03 | Discharge: 2020-01-03 | Disposition: A | Discharge status: Home / Self Care | Payer: Medicare Other | Attending: Emergency Medicine | Admitting: Emergency Medicine

## 2020-01-03 ENCOUNTER — Other Ambulatory Visit: Payer: Self-pay

## 2020-01-03 DIAGNOSIS — I110 Hypertensive heart disease with heart failure: Secondary | ICD-10-CM | POA: Diagnosis not present

## 2020-01-03 DIAGNOSIS — I10 Essential (primary) hypertension: Secondary | ICD-10-CM | POA: Diagnosis not present

## 2020-01-03 DIAGNOSIS — S0990XA Unspecified injury of head, initial encounter: Secondary | ICD-10-CM | POA: Diagnosis not present

## 2020-01-03 DIAGNOSIS — W19XXXA Unspecified fall, initial encounter: Secondary | ICD-10-CM

## 2020-01-03 DIAGNOSIS — Z79899 Other long term (current) drug therapy: Secondary | ICD-10-CM | POA: Insufficient documentation

## 2020-01-03 DIAGNOSIS — I5032 Chronic diastolic (congestive) heart failure: Secondary | ICD-10-CM | POA: Insufficient documentation

## 2020-01-03 DIAGNOSIS — G319 Degenerative disease of nervous system, unspecified: Secondary | ICD-10-CM | POA: Diagnosis not present

## 2020-01-03 DIAGNOSIS — Z7984 Long term (current) use of oral hypoglycemic drugs: Secondary | ICD-10-CM | POA: Insufficient documentation

## 2020-01-03 DIAGNOSIS — M25511 Pain in right shoulder: Secondary | ICD-10-CM | POA: Insufficient documentation

## 2020-01-03 DIAGNOSIS — R109 Unspecified abdominal pain: Secondary | ICD-10-CM | POA: Insufficient documentation

## 2020-01-03 DIAGNOSIS — Z7982 Long term (current) use of aspirin: Secondary | ICD-10-CM | POA: Insufficient documentation

## 2020-01-03 DIAGNOSIS — M25562 Pain in left knee: Secondary | ICD-10-CM | POA: Diagnosis not present

## 2020-01-03 DIAGNOSIS — Z96653 Presence of artificial knee joint, bilateral: Secondary | ICD-10-CM | POA: Insufficient documentation

## 2020-01-03 DIAGNOSIS — M25561 Pain in right knee: Secondary | ICD-10-CM | POA: Insufficient documentation

## 2020-01-03 DIAGNOSIS — I2510 Atherosclerotic heart disease of native coronary artery without angina pectoris: Secondary | ICD-10-CM | POA: Insufficient documentation

## 2020-01-03 DIAGNOSIS — W050XXA Fall from non-moving wheelchair, initial encounter: Secondary | ICD-10-CM | POA: Insufficient documentation

## 2020-01-03 DIAGNOSIS — J45909 Unspecified asthma, uncomplicated: Secondary | ICD-10-CM | POA: Insufficient documentation

## 2020-01-03 DIAGNOSIS — E119 Type 2 diabetes mellitus without complications: Secondary | ICD-10-CM | POA: Insufficient documentation

## 2020-01-03 DIAGNOSIS — I6389 Other cerebral infarction: Secondary | ICD-10-CM | POA: Diagnosis not present

## 2020-01-03 HISTORY — DX: Old myocardial infarction: I25.2

## 2020-01-03 LAB — CBC
HCT: 37.6 % (ref 36.0–46.0)
Hemoglobin: 12.7 g/dL (ref 12.0–15.0)
MCH: 30.4 pg (ref 26.0–34.0)
MCHC: 33.8 g/dL (ref 30.0–36.0)
MCV: 90 fL (ref 80.0–100.0)
Platelets: 176 10*3/uL (ref 150–400)
RBC: 4.18 MIL/uL (ref 3.87–5.11)
RDW: 13.5 % (ref 11.5–15.5)
WBC: 5.4 10*3/uL (ref 4.0–10.5)
nRBC: 0 % (ref 0.0–0.2)

## 2020-01-03 LAB — COMPREHENSIVE METABOLIC PANEL
ALT: 18 U/L (ref 0–44)
AST: 16 U/L (ref 15–41)
Albumin: 3.7 g/dL (ref 3.5–5.0)
Alkaline Phosphatase: 80 U/L (ref 38–126)
Anion gap: 7 (ref 5–15)
BUN: 18 mg/dL (ref 8–23)
CO2: 32 mmol/L (ref 22–32)
Calcium: 9.4 mg/dL (ref 8.9–10.3)
Chloride: 97 mmol/L — ABNORMAL LOW (ref 98–111)
Creatinine, Ser: 0.74 mg/dL (ref 0.44–1.00)
GFR, Estimated: >60 mL/min (ref >60)
Glucose, Bld: 196 mg/dL — ABNORMAL HIGH (ref 70–99)
Potassium: 4.6 mmol/L (ref 3.5–5.1)
Sodium: 136 mmol/L (ref 135–145)
Total Bilirubin: 0.6 mg/dL (ref 0.3–1.2)
Total Protein: 6.6 g/dL (ref 6.5–8.1)

## 2020-01-03 LAB — LIPASE, BLOOD: Lipase: 20 U/L (ref 11–51)

## 2020-01-03 MED ORDER — TRAMADOL HCL 50 MG PO TABS
50.0000 mg | ORAL_TABLET | Freq: Once | ORAL | Status: AC
  Administered 2020-01-03: 50 mg via ORAL
  Filled 2020-01-03: qty 1

## 2020-01-03 MED ORDER — TRAMADOL HCL 50 MG PO TABS: 50.0000 mg | ORAL_TABLET | Freq: Four times a day (QID) | ORAL | 0 refills | Status: AC | PRN

## 2020-01-03 NOTE — ED Notes (Signed)
Pt reports mechanical fall earlier today. Pt denies LOC. Pt states she did not hit head. Neuro exam WNL. Pt states she does take plavix. Pt has c/o right knee and shoulder pain. Motor and sensation intact distally.

## 2020-01-03 NOTE — ED Provider Notes (Signed)
[facility] Emergency Department Provider Note  ____________________________________________  Time seen: Approximately 8:35 PM  I have reviewed the triage vital signs and the nursing notes.   HISTORY  Chief Complaint Fall    HPI Lydia King is a 86 y.o. female with a history of heart failure CAD GERD hypertension and multiple prior strokes with chronic balance issues who was in her usual state of health today until attempting to stand up and ambulate with her walker.  Immediately on standing, she lost her balance and fell over, landing on the edge of her roommates bed.  No loss of consciousness, no neck pain.  She complains of right shoulder pain which she feels is a chronic baseline and right knee pain which is worse than usual.  No hip pain, no chest pain or shortness of breath, no worrisome preceding symptoms.  No change in diet or bowel or bladder habits.      Past Medical History:  Diagnosis Date  . (HFpEF) heart failure with preserved ejection fraction ([place])    a. 06/2019 Echo ([facility]): EF >55%, mild conc LVH. Mild-mod AS, mild MR/TR.  Marland Kitchen Anemia    in past (from blood loss at hiatal hernia)  . Asthma   . CAD (coronary artery disease)    a. 05/2019 MI/PCI: ost LCX (rota + cba-->3.0x16 Synergy DES), LAD 90 (3.0x16 Synergy DES).  . Charcot-Marie-Tooth disease    walks with cane (fairly controlled)  . Diverticulosis   . DVT (deep venous thrombosis) ([place]) 2003   after sx  . GERD (gastroesophageal reflux disease)   . Grief reaction    on fluoxetine  . Headache(784.0)    migraine  . Hiatal hernia   . Hyperlipidemia   . Hypertension   . OA (ocular albinism) ([place])   . Old myocardial infarction   . Osteopenia   . PUD (peptic ulcer disease)    gastric, past  . Shingles   . Stroke ([place]) 8/10   posterior reversible encephalopathy syndrome  . Urine incontinence      Patient Active Problem List   Diagnosis Date Noted  . Pain due to onychomycosis  of toenails of both feet 11/24/2019  . Anxiety 11/09/2019  . Chronic diastolic CHF (congestive heart failure) ([place]) 11/03/2019  . Paraesophageal hiatal hernia   . Chest pain 11/02/2019  . CAD (coronary artery disease) 08/01/2019  . Anemia 08/01/2019  . Acute on chronic congestive heart failure ([place]) 07/31/2019  . Acute decompensated heart failure ([place]) 07/03/2019  . Bradycardia 06/17/2019  . Hypoalbuminemia 06/17/2019  . Severe protein-calorie malnutrition ([place]) 06/17/2019  . Aortic valve stenosis 06/15/2019  . Mitral valve regurgitation 06/15/2019  . Positive cardiac stress test 06/13/2019  . Closed fracture of right distal femur ([place]) 03/15/2019  . Periprosthetic fracture around internal prosthetic right knee joint 03/15/2019  . Hyperlipidemia associated with type 2 diabetes mellitus ([place]) 07/27/2018  . Sinus congestion 02/05/2018  . Vertigo 05/06/2017  . OSA (obstructive sleep apnea) 01/02/2016  . Pedal edema 01/02/2016  . Fall in home 12/11/2015  . Poor balance 12/11/2015  . Low back pain with sciatica 10/02/2015  . Right hip pain 10/02/2015  . Hearing loss 04/18/2015  . Encounter for Medicare annual wellness exam 10/04/2013  . Type 2 diabetes mellitus without complications ([place]) 11/12/5100  . History of CVA (cerebrovascular accident) 09/12/2008  . Osteoarthritis of multiple joints 05/30/2008  . Osteoporosis 05/30/2008  . Vitamin D deficiency 07/14/2007  . Sleep apnea 07/14/2007  . Essential hypertension 11/30/2006  .  Asthma 11/30/2006  . H/O gastroesophageal reflux (GERD) 11/30/2006  . G I BLEED 11/30/2006  . DEEP VENOUS THROMBOPHLEBITIS, HX OF 11/30/2006     Past Surgical History:  Procedure Laterality Date  . carotid dopplers  8/10  . COLONOSCOPY  2002  . HERNIA REPAIR  2002   bleeding  . HIATAL HERNIA REPAIR  2000   needed blood transfusion  . Stroke  8/10   hosp stroke/ posterior (vs posterior reversible encephalopathy syndrome) - d/c on Plavix  . TEE  normal  1/44   no emoblic source  . TOTAL KNEE ARTHROPLASTY  03,06   Bilateral  . VESICOVAGINAL FISTULA CLOSURE W/ TAH  1964   with bleeding and cervical dysplasia     Prior to Admission medications   Medication Sig Start Date End Date Taking? Authorizing Provider  acetaminophen (TYLENOL) 500 MG tablet Take 2 tablets (1,000 mg total) by mouth in the morning, at noon, and at bedtime. 11/04/19   Fritzi Mandes, MD  alum & mag hydroxide-simeth (MAALOX/MYLANTA) 200-200-20 MG/5ML suspension Take 30 mLs by mouth in the morning, at noon, and at bedtime.    [provider]  aspirin EC 81 MG EC tablet Take 1 tablet (81 mg total) by mouth daily. Swallow whole. 08/05/19   Bonnielee Haff, MD  azelastine (ASTELIN) 0.1 % nasal spray Place 1 spray into both nostrils 2 (two) times daily. Use in each nostril as directed 08/26/17   Cuthriell, Charline Bills, PA-C  Blood Glucose Monitoring Suppl (FREESTYLE LITE) DEVI Use to check blood glucose daily and as needed for diabetes 10/11/17   Tower, Wynelle Fanny, MD  Calcium Carbonate-Vitamin D 600-400 MG-UNIT tablet Take 1 tablet by mouth daily with breakfast.     [provider]  carvedilol (COREG) 6.25 MG tablet Take 1 tablet (6.25 mg total) by mouth 2 (two) times daily with a meal. 11/04/19   Fritzi Mandes, MD  Cholecalciferol (VITAMIN D) 2000 UNITS CAPS Take 2 capsules by mouth daily.      [provider]  cloNIDine (CATAPRES) 0.1 MG tablet Take 0.1 mg by mouth 3 (three) times daily as needed.    [provider]  clopidogrel (PLAVIX) 75 MG tablet TAKE 1 TABLET BY MOUTH EVERY DAY Patient taking differently: Take 75 mg by mouth daily.  07/26/19   Tower, Wynelle Fanny, MD  cyanocobalamin 1000 MCG tablet Take 1,000 mcg by mouth daily.    [provider]  esomeprazole (NEXIUM) 40 MG capsule Take 1 capsule (40 mg total) by mouth daily before breakfast. 07/27/18   Tower, Wynelle Fanny, MD  famotidine (PEPCID) 20 MG tablet Take 20 mg by mouth at bedtime.     [provider]  Flaxseed, Linseed, (FLAX SEED OIL) 1000 MG CAPS Take 1 capsule by mouth daily.      [provider]  FLUoxetine (PROZAC) 20 MG capsule Take 20 mg by mouth daily. 11/20/19   [provider]  FLUoxetine (PROZAC) 40 MG capsule Take 1 capsule (40 mg total) by mouth daily. 11/09/19   Tower, Wynelle Fanny, MD  furosemide (LASIX) 40 MG tablet Take 1 tablet (40 mg total) by mouth daily. 11/09/19   Tower, Marne A, MD  glucose blood (FREESTYLE LITE) test strip Use to check blood sugar once daily for DM (dx. R15.400) 11/08/18   Tower, Wynelle Fanny, MD  isosorbide mononitrate (IMDUR) 30 MG 24 hr tablet Take 1 tablet (30 mg total) by mouth 2 (two) times daily. 11/04/19 02/02/20  Fritzi Mandes, MD  Lancets (FREESTYLE) lancets Use as instructedUse to check blood sugar once daily for DM (dx. E11.618) 11/08/18   Tower, Wynelle Fanny, MD  lidocaine (LMX) 4 % cream Apply 1 application topically in the morning and at bedtime. To low back and shoulder areas 11/11/19   Tower, [place] A, MD  lisinopril (ZESTRIL) 20 MG tablet Take 1 tablet (20 mg total) by mouth daily. 11/09/19   Tower, Wynelle Fanny, MD  loperamide (IMODIUM) 2 MG capsule Take 2 mg by mouth as needed for diarrhea or loose stools.    [provider]  loratadine (CLARITIN) 10 MG tablet TAKE 1 TABLET BY MOUTH EVERY DAY Patient taking differently: Take 10 mg by mouth daily.  02/28/19   Tower, Wynelle Fanny, MD  melatonin 3 MG TABS tablet Take 3 mg by mouth at bedtime.    [provider]  metFORMIN (GLUCOPHAGE XR) 500 MG 24 hr tablet Take 1 tablet (500 mg total) by mouth daily with breakfast. 11/04/19 11/03/20  Fritzi Mandes, MD  Multiple Vitamins-Minerals (PRESERVISION AREDS 2) CAPS Give one pill by mouth bid 11/09/19   Tower, Wynelle Fanny, MD  nitroGLYCERIN (NITROSTAT) 0.4 MG SL tablet Place under the tongue. 06/19/19 06/18/20  [provider]  omega-3 acid ethyl esters (LOVAZA) 1 g capsule TAKE 1 CAPSULE (1 G TOTAL) BY MOUTH  DAILY. Patient taking differently: Take 1 g by mouth 2 (two) times daily.  07/26/19   Tower, Wynelle Fanny, MD  ondansetron (ZOFRAN) 4 MG tablet Take 4 mg by mouth every 4 (four) hours as needed for nausea or vomiting.    [provider]  polyethylene glycol (MIRALAX / GLYCOLAX) 17 g packet Take 17 g by mouth daily. 11/11/19   Tower, Wynelle Fanny, MD  PROAIR HFA 108 [phone] Base) MCG/ACT inhaler INHALE 2 PUFFS INTO THE LUNGS EVERY 4 (FOUR) HOURS AS NEEDED FOR WHEEZING OR SHORTNESS OF BREATH. 03/05/16   Tower, Wynelle Fanny, MD  rosuvastatin (CRESTOR) 20 MG tablet TAKE 1 TABLET BY MOUTH EVERYDAY AT BEDTIME Patient taking differently: Take 20 mg by mouth at bedtime. TAKE 1 TABLET BY MOUTH EVERYDAY AT BEDTIME 11/04/19   Fritzi Mandes, MD  sennosides-docusate sodium (SENOKOT-S) 8.6-50 MG tablet Take 2 tablets by mouth daily.    [provider]  traMADol (ULTRAM) 50 MG tablet Take 1 tablet (50 mg total) by mouth every 6 (six) hours as needed for up to 3 days for moderate pain. 01/03/20 01/06/20  Carrie Mew, MD  trolamine salicylate (ASPERCREME/ALOE) 10 % cream Apply 1 application topically daily. To shoulder areas 11/11/19   Tower, Wynelle Fanny, MD     Allergies Morphine and related, Other, Shrimp [shellfish allergy], Amlodipine besylate, Atorvastatin, Ciprofloxacin, Influenza vaccines, Iodinated diagnostic agents, Nitrofurantoin, Omeprazole, Pantoprazole sodium, Penicillin g, Penicillins, Simvastatin, Sulfonamide derivatives, Tramadol, Codeine, and Prednisone   Family History  Problem Relation Age of Onset  . Cerebral aneurysm Son   . Cerebral aneurysm Father   . Hyperlipidemia Father   . Alcohol abuse Father   . Stroke Mother   . Cervical cancer Sister   . Hyperlipidemia Brother   . Alcohol abuse Brother   . Stroke Brother   . Diabetes Brother   . Diabetes Sister   . Sarcoidosis Sister   . Other Sister        OP/ Broken hip  . Other Other        whole family has charcot marie tooth  . Ovarian  cancer Sister     Social History Social History  Tobacco Use  . Smoking status: Never Smoker  . Smokeless tobacco: Never Used  Vaping Use  . Vaping Use: Never used  Substance Use Topics  . Alcohol use: No  . Drug use: No    Review of Systems  Constitutional:   No fever or chills.  ENT:   No sore throat. No rhinorrhea. Cardiovascular:   No chest pain or syncope. Respiratory:   No dyspnea or cough. Gastrointestinal:   Negative for abdominal pain, vomiting and diarrhea.  Musculoskeletal:   Right shoulder pain, right knee pain All other systems reviewed and are negative except as documented above in ROS and HPI.  ____________________________________________   PHYSICAL EXAM:  VITAL SIGNS: ED Triage Vitals  Enc Vitals Group     BP 01/03/20 1552 139/64     Pulse Rate 01/03/20 1552 67     Resp 01/03/20 1552 16     Temp 01/03/20 1552 98.6 F (37 C)     Temp Source 01/03/20 1552 Oral     SpO2 01/03/20 1552 93 %     Weight 01/03/20 1552 158 lb (71.7 kg)     Height 01/03/20 1552 4\' 11"  (1.499 m)     Head Circumference --      Peak Flow --      Pain Score 01/03/20 1614 8     Pain Loc --      Pain Edu? --      Excl. in [place]? --     Vital signs reviewed, nursing assessments reviewed.   Constitutional:   Alert and oriented. Non-toxic appearance. Eyes:   Conjunctivae are normal. EOMI. PERRL. ENT      Head:   Normocephalic and atraumatic.      Nose:   Wearing a mask.      Mouth/Throat:   Wearing a mask.      Neck:   No meningismus. Full ROM.  No midline tenderness Hematological/Lymphatic/Immunilogical:   No cervical lymphadenopathy. Cardiovascular:   RRR. Symmetric bilateral radial and DP pulses.  No murmurs. Cap refill less than 2 seconds. Respiratory:   Normal respiratory effort without tachypnea/retractions. Breath sounds are clear and equal bilaterally. No wheezes/rales/rhonchi. Gastrointestinal:   Soft and nontender. Non distended. There is no CVA tenderness.  No  rebound, rigidity, or guarding.  Musculoskeletal:   Normal range of motion in all extremities. No joint effusions.  There is diffuse tenderness about the right knee, no bony point tenderness or instability.  There is tenderness over the superior aspect of the right shoulder at the [facility] joint. Neurologic:   Normal speech and language.  Motor grossly intact. No acute focal neurologic deficits are appreciated.  Skin:    Skin is warm, dry and intact. No rash noted.  No petechiae, purpura, or bullae.  ____________________________________________    LABS (pertinent positives/negatives) (all labs ordered are listed, but only abnormal results are displayed) Labs Reviewed  COMPREHENSIVE METABOLIC PANEL - Abnormal; Notable for the following components:      Result Value   Chloride 97 (*)    Glucose, Bld 196 (*)    All other components within normal limits  LIPASE, BLOOD  CBC  URINALYSIS, COMPLETE (UACMP) WITH MICROSCOPIC   ____________________________________________   EKG  Interpreted by me Normal sinus rhythm rate of 66, right axis, first-degree AV block.  Poor R wave progression.  Normal ST segments and T waves, no acute ischemic changes.  ____________________________________________    RADIOLOGY  CT HEAD WO CONTRAST  Result Date: 01/03/2020 CLINICAL DATA:  Fall. EXAM: CT HEAD WITHOUT CONTRAST TECHNIQUE: Contiguous axial images were obtained from the base of the skull through the vertex without intravenous contrast. COMPARISON:  December 10, 2019. FINDINGS: Brain: No acute hemorrhage. No hydrocephalus. No mass lesion or abnormal mass effect. No evidence of acute large vascular territory infarct. There are multiple remote infarcts, including bilateral cerebellar infarcts, multiple infarcts in bilateral occipital lobes, and a right thalamic infarct. Additional scattered white matter hypoattenuation, likely related to chronic microvascular ischemic disease. Generalized cerebral atrophy  with ex vacuo ventricular dilation. Vascular: Calcific atherosclerosis. Skull: No acute fracture. Sinuses/Orbits: Visualized sinuses are clear.  Unremarkable orbits. Other: No mastoid effusions. IMPRESSION: 1. No evidence of acute intracranial abnormality. 2. Multiple remote supratentorial infratentorial infarcts, as above. Electronically Signed   By: Margaretha Sheffield MD   On: 01/03/2020 17:02    ____________________________________________   PROCEDURES Procedures  ____________________________________________  DIFFERENTIAL DIAGNOSIS   Intracranial hemorrhage, ambulatory dysfunction, knee fracture, electrolyte normality, anemia  CLINICAL IMPRESSION / ASSESSMENT AND PLAN / ED COURSE  Medications ordered in the ED: Medications  traMADol (ULTRAM) tablet 50 mg (has no administration in time range)    Pertinent labs & imaging results that were available during my care of the patient were reviewed by me and considered in my medical decision making (see chart for details).  Lydia King was evaluated in Emergency Department on 01/03/2020 for the symptoms described in the history of present illness. She was evaluated in the context of the global COVID-19 pandemic, which necessitated consideration that the patient might be at risk for infection with the SARS-CoV-2 virus that causes COVID-19. Institutional protocols and algorithms that pertain to the evaluation of patients at risk for COVID-19 are in a state of rapid change based on information released by regulatory bodies including the CDC and federal and state organizations. These policies and algorithms were followed during the patient's care in the ED.     Clinical Course as of Jan 02 2034  Tue Jan 03, 2020  2031 Patient presents after mechanical fall related to chronic balance issues.  CT head is negative, exam shows some tenderness at the superior aspect of the right shoulder which she says is chronic, and tenderness of the right knee  which is new.  She declines to have x-rays of the shoulder and knee at this time, noting that she is scheduled to follow-up with her orthopedic surgeon tomorrow and always gets x-rays done there.  She feels ready to go back home to her facility.  I will give her tramadol and refill this medication which she is on for her musculoskeletal pain and discharge home.   [PS]    Clinical Course User Index [PS] Carrie Mew, MD     ____________________________________________   FINAL CLINICAL IMPRESSION(S) / ED DIAGNOSES    Final diagnoses:  Fall, initial encounter  Acute pain of right knee     ED Discharge Orders         Ordered    traMADol (ULTRAM) 50 MG tablet  Every 6 hours PRN        01/03/20 2035          Portions of this note were generated with dragon dictation software. Dictation errors may occur despite best attempts at proofreading.   Carrie Mew, MD 01/03/20 2037

## 2020-01-03 NOTE — ED Triage Notes (Signed)
PT to ED with family. PT is from [facility] and had a fall this morning while getting up from her wheelchair and attempting to get to her walking. PT unsure why she fell, but during falling her head hit the foot of her roommates bed. No LOC but pt does take plavix. Multiple gait impairments, pt in PT. Multiple falls lately. PT also c/o abd pain r/t hiatal hernia since fall. Also pain to both knees, hx of bilateral knee arthroplasty.

## 2020-01-04 DIAGNOSIS — S72402D Unspecified fracture of lower end of left femur, subsequent encounter for closed fracture with routine healing: Secondary | ICD-10-CM | POA: Diagnosis not present

## 2020-01-04 DIAGNOSIS — W1839XD Other fall on same level, subsequent encounter: Secondary | ICD-10-CM | POA: Diagnosis not present

## 2020-01-04 DIAGNOSIS — D649 Anemia, unspecified: Secondary | ICD-10-CM | POA: Diagnosis not present

## 2020-01-04 DIAGNOSIS — E7849 Other hyperlipidemia: Secondary | ICD-10-CM | POA: Diagnosis not present

## 2020-01-04 DIAGNOSIS — I10 Essential (primary) hypertension: Secondary | ICD-10-CM | POA: Diagnosis not present

## 2020-01-04 DIAGNOSIS — W1839XA Other fall on same level, initial encounter: Secondary | ICD-10-CM | POA: Diagnosis not present

## 2020-01-04 DIAGNOSIS — M8580 Other specified disorders of bone density and structure, unspecified site: Secondary | ICD-10-CM | POA: Diagnosis not present

## 2020-01-04 DIAGNOSIS — I69398 Other sequelae of cerebral infarction: Secondary | ICD-10-CM | POA: Diagnosis not present

## 2020-01-04 DIAGNOSIS — Z96653 Presence of artificial knee joint, bilateral: Secondary | ICD-10-CM | POA: Diagnosis not present

## 2020-01-04 DIAGNOSIS — M1611 Unilateral primary osteoarthritis, right hip: Secondary | ICD-10-CM | POA: Diagnosis not present

## 2020-01-04 DIAGNOSIS — M6281 Muscle weakness (generalized): Secondary | ICD-10-CM | POA: Diagnosis not present

## 2020-01-04 DIAGNOSIS — Z79899 Other long term (current) drug therapy: Secondary | ICD-10-CM | POA: Diagnosis not present

## 2020-01-04 DIAGNOSIS — E119 Type 2 diabetes mellitus without complications: Secondary | ICD-10-CM | POA: Diagnosis not present

## 2020-01-04 DIAGNOSIS — S72401G Unspecified fracture of lower end of right femur, subsequent encounter for closed fracture with delayed healing: Secondary | ICD-10-CM | POA: Diagnosis not present

## 2020-01-04 DIAGNOSIS — Z9049 Acquired absence of other specified parts of digestive tract: Secondary | ICD-10-CM | POA: Diagnosis not present

## 2020-01-04 DIAGNOSIS — G600 Hereditary motor and sensory neuropathy: Secondary | ICD-10-CM | POA: Diagnosis not present

## 2020-01-04 DIAGNOSIS — S72401A Unspecified fracture of lower end of right femur, initial encounter for closed fracture: Secondary | ICD-10-CM | POA: Diagnosis not present

## 2020-01-04 DIAGNOSIS — M9711XD Periprosthetic fracture around internal prosthetic right knee joint, subsequent encounter: Secondary | ICD-10-CM | POA: Diagnosis not present

## 2020-01-05 ENCOUNTER — Telehealth: Payer: Self-pay

## 2020-01-05 NOTE — Telephone Encounter (Signed)
Please d/c the PT

## 2020-01-05 NOTE — Telephone Encounter (Signed)
Vm left at triage.  [name], Madaline Savage (on dpr), states last time pt was seen by Dr. Glori Bickers, they requested order for PT.  Says pt has fallen 3x since starting PT.  Pt states it's too much.  She is in too much pain and cannot function and it's too tiring. Madaline Savage is requesting PT order be stopped.  Pt is at [facility].  Plz advise Madaline Savage at [phone].

## 2020-01-06 NOTE — Telephone Encounter (Signed)
LVM for Lydia King at [facility] at [phone] to d/c PT order. LV for pt's daughter that order has been d/c.

## 2020-01-06 NOTE — Telephone Encounter (Signed)
[name] returned call. Advised VM was left to d/c orders for PT. [name] appreciative and verbalized understanding.

## 2020-01-12 ENCOUNTER — Encounter: Payer: Self-pay | Admitting: Family Medicine

## 2020-01-13 DIAGNOSIS — S0012XA Contusion of left eyelid and periocular area, initial encounter: Secondary | ICD-10-CM | POA: Diagnosis not present

## 2020-01-16 DIAGNOSIS — S72402D Unspecified fracture of lower end of left femur, subsequent encounter for closed fracture with routine healing: Secondary | ICD-10-CM | POA: Diagnosis not present

## 2020-01-16 DIAGNOSIS — M9711XD Periprosthetic fracture around internal prosthetic right knee joint, subsequent encounter: Secondary | ICD-10-CM | POA: Diagnosis not present

## 2020-01-16 DIAGNOSIS — H5711 Ocular pain, right eye: Secondary | ICD-10-CM | POA: Diagnosis not present

## 2020-01-16 DIAGNOSIS — M6281 Muscle weakness (generalized): Secondary | ICD-10-CM | POA: Diagnosis not present

## 2020-01-16 DIAGNOSIS — R278 Other lack of coordination: Secondary | ICD-10-CM | POA: Diagnosis not present

## 2020-01-23 DIAGNOSIS — M6281 Muscle weakness (generalized): Secondary | ICD-10-CM | POA: Diagnosis not present

## 2020-01-23 DIAGNOSIS — W19XXXA Unspecified fall, initial encounter: Secondary | ICD-10-CM | POA: Diagnosis not present

## 2020-01-23 DIAGNOSIS — M25521 Pain in right elbow: Secondary | ICD-10-CM | POA: Diagnosis not present

## 2020-01-23 DIAGNOSIS — M79601 Pain in right arm: Secondary | ICD-10-CM | POA: Diagnosis not present

## 2020-01-23 DIAGNOSIS — M25511 Pain in right shoulder: Secondary | ICD-10-CM | POA: Diagnosis not present

## 2020-01-23 DIAGNOSIS — G894 Chronic pain syndrome: Secondary | ICD-10-CM | POA: Diagnosis not present

## 2020-01-31 DIAGNOSIS — R5381 Other malaise: Secondary | ICD-10-CM | POA: Diagnosis not present

## 2020-01-31 DIAGNOSIS — R0981 Nasal congestion: Secondary | ICD-10-CM | POA: Diagnosis not present

## 2020-01-31 DIAGNOSIS — R52 Pain, unspecified: Secondary | ICD-10-CM | POA: Diagnosis not present

## 2020-01-31 DIAGNOSIS — J069 Acute upper respiratory infection, unspecified: Secondary | ICD-10-CM | POA: Diagnosis not present

## 2020-02-01 DIAGNOSIS — U071 COVID-19: Secondary | ICD-10-CM | POA: Diagnosis not present

## 2020-02-01 DIAGNOSIS — I5021 Acute systolic (congestive) heart failure: Secondary | ICD-10-CM | POA: Diagnosis not present

## 2020-02-01 DIAGNOSIS — E70319 Ocular albinism, unspecified: Secondary | ICD-10-CM | POA: Diagnosis not present

## 2020-02-01 DIAGNOSIS — D649 Anemia, unspecified: Secondary | ICD-10-CM | POA: Diagnosis not present

## 2020-02-01 DIAGNOSIS — I5040 Unspecified combined systolic (congestive) and diastolic (congestive) heart failure: Secondary | ICD-10-CM | POA: Diagnosis not present

## 2020-02-01 DIAGNOSIS — E7849 Other hyperlipidemia: Secondary | ICD-10-CM | POA: Diagnosis not present

## 2020-02-01 DIAGNOSIS — M6281 Muscle weakness (generalized): Secondary | ICD-10-CM | POA: Diagnosis not present

## 2020-02-01 DIAGNOSIS — S72402D Unspecified fracture of lower end of left femur, subsequent encounter for closed fracture with routine healing: Secondary | ICD-10-CM | POA: Diagnosis not present

## 2020-02-01 DIAGNOSIS — J45998 Other asthma: Secondary | ICD-10-CM | POA: Diagnosis not present

## 2020-02-01 DIAGNOSIS — I252 Old myocardial infarction: Secondary | ICD-10-CM | POA: Diagnosis not present

## 2020-02-01 DIAGNOSIS — K219 Gastro-esophageal reflux disease without esophagitis: Secondary | ICD-10-CM | POA: Diagnosis not present

## 2020-02-01 DIAGNOSIS — G600 Hereditary motor and sensory neuropathy: Secondary | ICD-10-CM | POA: Diagnosis not present

## 2020-02-01 DIAGNOSIS — R519 Headache, unspecified: Secondary | ICD-10-CM | POA: Diagnosis not present

## 2020-02-01 DIAGNOSIS — R278 Other lack of coordination: Secondary | ICD-10-CM | POA: Diagnosis not present

## 2020-02-01 DIAGNOSIS — I340 Nonrheumatic mitral (valve) insufficiency: Secondary | ICD-10-CM | POA: Diagnosis not present

## 2020-02-01 DIAGNOSIS — J9611 Chronic respiratory failure with hypoxia: Secondary | ICD-10-CM | POA: Diagnosis not present

## 2020-02-01 DIAGNOSIS — Z8781 Personal history of (healed) traumatic fracture: Secondary | ICD-10-CM | POA: Diagnosis not present

## 2020-02-01 DIAGNOSIS — I639 Cerebral infarction, unspecified: Secondary | ICD-10-CM | POA: Diagnosis not present

## 2020-02-01 DIAGNOSIS — R058 Other specified cough: Secondary | ICD-10-CM | POA: Diagnosis not present

## 2020-02-01 DIAGNOSIS — Z20822 Contact with and (suspected) exposure to covid-19: Secondary | ICD-10-CM | POA: Diagnosis not present

## 2020-02-01 DIAGNOSIS — M1990 Unspecified osteoarthritis, unspecified site: Secondary | ICD-10-CM | POA: Diagnosis not present

## 2020-02-01 DIAGNOSIS — G43909 Migraine, unspecified, not intractable, without status migrainosus: Secondary | ICD-10-CM | POA: Diagnosis not present

## 2020-02-01 DIAGNOSIS — I69398 Other sequelae of cerebral infarction: Secondary | ICD-10-CM | POA: Diagnosis not present

## 2020-02-01 DIAGNOSIS — E559 Vitamin D deficiency, unspecified: Secondary | ICD-10-CM | POA: Diagnosis not present

## 2020-02-01 DIAGNOSIS — M9711XD Periprosthetic fracture around internal prosthetic right knee joint, subsequent encounter: Secondary | ICD-10-CM | POA: Diagnosis not present

## 2020-02-01 DIAGNOSIS — E43 Unspecified severe protein-calorie malnutrition: Secondary | ICD-10-CM | POA: Diagnosis not present

## 2020-02-01 DIAGNOSIS — Z20828 Contact with and (suspected) exposure to other viral communicable diseases: Secondary | ICD-10-CM | POA: Diagnosis not present

## 2020-02-01 DIAGNOSIS — Z86718 Personal history of other venous thrombosis and embolism: Secondary | ICD-10-CM | POA: Diagnosis not present

## 2020-02-01 DIAGNOSIS — I10 Essential (primary) hypertension: Secondary | ICD-10-CM | POA: Diagnosis not present

## 2020-02-01 DIAGNOSIS — R0989 Other specified symptoms and signs involving the circulatory and respiratory systems: Secondary | ICD-10-CM | POA: Diagnosis not present

## 2020-02-01 DIAGNOSIS — G4733 Obstructive sleep apnea (adult) (pediatric): Secondary | ICD-10-CM | POA: Diagnosis not present

## 2020-02-01 DIAGNOSIS — E119 Type 2 diabetes mellitus without complications: Secondary | ICD-10-CM | POA: Diagnosis not present

## 2020-02-01 DIAGNOSIS — I350 Nonrheumatic aortic (valve) stenosis: Secondary | ICD-10-CM | POA: Diagnosis not present

## 2020-02-03 DIAGNOSIS — R519 Headache, unspecified: Secondary | ICD-10-CM | POA: Diagnosis not present

## 2020-02-03 DIAGNOSIS — R0989 Other specified symptoms and signs involving the circulatory and respiratory systems: Secondary | ICD-10-CM | POA: Diagnosis not present

## 2020-02-03 DIAGNOSIS — R058 Other specified cough: Secondary | ICD-10-CM | POA: Diagnosis not present

## 2020-02-03 DIAGNOSIS — U071 COVID-19: Secondary | ICD-10-CM | POA: Diagnosis not present

## 2020-02-21 DIAGNOSIS — Z79899 Other long term (current) drug therapy: Secondary | ICD-10-CM | POA: Diagnosis not present

## 2020-02-21 DIAGNOSIS — E1169 Type 2 diabetes mellitus with other specified complication: Secondary | ICD-10-CM | POA: Diagnosis not present

## 2020-02-21 DIAGNOSIS — Z91013 Allergy to seafood: Secondary | ICD-10-CM | POA: Diagnosis not present

## 2020-02-21 DIAGNOSIS — Z7902 Long term (current) use of antithrombotics/antiplatelets: Secondary | ICD-10-CM | POA: Diagnosis not present

## 2020-02-21 DIAGNOSIS — E43 Unspecified severe protein-calorie malnutrition: Secondary | ICD-10-CM | POA: Diagnosis not present

## 2020-02-21 DIAGNOSIS — I69398 Other sequelae of cerebral infarction: Secondary | ICD-10-CM | POA: Diagnosis not present

## 2020-02-21 DIAGNOSIS — E70319 Ocular albinism, unspecified: Secondary | ICD-10-CM | POA: Diagnosis not present

## 2020-02-21 DIAGNOSIS — S0990XA Unspecified injury of head, initial encounter: Secondary | ICD-10-CM | POA: Diagnosis not present

## 2020-02-21 DIAGNOSIS — S72402D Unspecified fracture of lower end of left femur, subsequent encounter for closed fracture with routine healing: Secondary | ICD-10-CM | POA: Diagnosis not present

## 2020-02-21 DIAGNOSIS — E7849 Other hyperlipidemia: Secondary | ICD-10-CM | POA: Diagnosis not present

## 2020-02-21 DIAGNOSIS — W01198A Fall on same level from slipping, tripping and stumbling with subsequent striking against other object, initial encounter: Secondary | ICD-10-CM | POA: Diagnosis not present

## 2020-02-21 DIAGNOSIS — M1990 Unspecified osteoarthritis, unspecified site: Secondary | ICD-10-CM | POA: Diagnosis not present

## 2020-02-21 DIAGNOSIS — G4733 Obstructive sleep apnea (adult) (pediatric): Secondary | ICD-10-CM | POA: Diagnosis not present

## 2020-02-21 DIAGNOSIS — M545 Low back pain, unspecified: Secondary | ICD-10-CM | POA: Diagnosis not present

## 2020-02-21 DIAGNOSIS — K219 Gastro-esophageal reflux disease without esophagitis: Secondary | ICD-10-CM | POA: Diagnosis not present

## 2020-02-21 DIAGNOSIS — S43001A Unspecified subluxation of right shoulder joint, initial encounter: Secondary | ICD-10-CM | POA: Diagnosis not present

## 2020-02-21 DIAGNOSIS — M9711XD Periprosthetic fracture around internal prosthetic right knee joint, subsequent encounter: Secondary | ICD-10-CM | POA: Diagnosis not present

## 2020-02-21 DIAGNOSIS — Z515 Encounter for palliative care: Secondary | ICD-10-CM | POA: Diagnosis not present

## 2020-02-21 DIAGNOSIS — M25551 Pain in right hip: Secondary | ICD-10-CM | POA: Diagnosis not present

## 2020-02-21 DIAGNOSIS — I10 Essential (primary) hypertension: Secondary | ICD-10-CM | POA: Diagnosis not present

## 2020-02-21 DIAGNOSIS — J45909 Unspecified asthma, uncomplicated: Secondary | ICD-10-CM | POA: Diagnosis not present

## 2020-02-21 DIAGNOSIS — U071 COVID-19: Secondary | ICD-10-CM | POA: Diagnosis not present

## 2020-02-21 DIAGNOSIS — I6529 Occlusion and stenosis of unspecified carotid artery: Secondary | ICD-10-CM | POA: Diagnosis not present

## 2020-02-21 DIAGNOSIS — I340 Nonrheumatic mitral (valve) insufficiency: Secondary | ICD-10-CM | POA: Diagnosis not present

## 2020-02-21 DIAGNOSIS — Z6829 Body mass index (BMI) 29.0-29.9, adult: Secondary | ICD-10-CM | POA: Diagnosis not present

## 2020-02-21 DIAGNOSIS — J9611 Chronic respiratory failure with hypoxia: Secondary | ICD-10-CM | POA: Diagnosis not present

## 2020-02-21 DIAGNOSIS — G600 Hereditary motor and sensory neuropathy: Secondary | ICD-10-CM | POA: Diagnosis not present

## 2020-02-21 DIAGNOSIS — M5459 Other low back pain: Secondary | ICD-10-CM | POA: Diagnosis not present

## 2020-02-21 DIAGNOSIS — E119 Type 2 diabetes mellitus without complications: Secondary | ICD-10-CM | POA: Diagnosis not present

## 2020-02-21 DIAGNOSIS — L299 Pruritus, unspecified: Secondary | ICD-10-CM | POA: Diagnosis not present

## 2020-02-21 DIAGNOSIS — Z8781 Personal history of (healed) traumatic fracture: Secondary | ICD-10-CM | POA: Diagnosis not present

## 2020-02-21 DIAGNOSIS — Z7984 Long term (current) use of oral hypoglycemic drugs: Secondary | ICD-10-CM | POA: Diagnosis not present

## 2020-02-21 DIAGNOSIS — M9701XA Periprosthetic fracture around internal prosthetic right hip joint, initial encounter: Secondary | ICD-10-CM | POA: Diagnosis not present

## 2020-02-21 DIAGNOSIS — I110 Hypertensive heart disease with heart failure: Secondary | ICD-10-CM | POA: Diagnosis not present

## 2020-02-21 DIAGNOSIS — I6389 Other cerebral infarction: Secondary | ICD-10-CM | POA: Diagnosis not present

## 2020-02-21 DIAGNOSIS — Y9289 Other specified places as the place of occurrence of the external cause: Secondary | ICD-10-CM | POA: Diagnosis not present

## 2020-02-21 DIAGNOSIS — Z20822 Contact with and (suspected) exposure to covid-19: Secondary | ICD-10-CM | POA: Diagnosis not present

## 2020-02-21 DIAGNOSIS — I5040 Unspecified combined systolic (congestive) and diastolic (congestive) heart failure: Secondary | ICD-10-CM | POA: Diagnosis not present

## 2020-02-21 DIAGNOSIS — I350 Nonrheumatic aortic (valve) stenosis: Secondary | ICD-10-CM | POA: Diagnosis not present

## 2020-02-21 DIAGNOSIS — Z7982 Long term (current) use of aspirin: Secondary | ICD-10-CM | POA: Diagnosis not present

## 2020-02-21 DIAGNOSIS — D649 Anemia, unspecified: Secondary | ICD-10-CM | POA: Diagnosis not present

## 2020-02-21 DIAGNOSIS — Z8616 Personal history of COVID-19: Secondary | ICD-10-CM | POA: Diagnosis not present

## 2020-02-21 DIAGNOSIS — Z96653 Presence of artificial knee joint, bilateral: Secondary | ICD-10-CM | POA: Diagnosis not present

## 2020-02-21 DIAGNOSIS — R278 Other lack of coordination: Secondary | ICD-10-CM | POA: Diagnosis not present

## 2020-02-21 DIAGNOSIS — Z86718 Personal history of other venous thrombosis and embolism: Secondary | ICD-10-CM | POA: Diagnosis not present

## 2020-02-21 DIAGNOSIS — G43909 Migraine, unspecified, not intractable, without status migrainosus: Secondary | ICD-10-CM | POA: Diagnosis not present

## 2020-02-21 DIAGNOSIS — M6281 Muscle weakness (generalized): Secondary | ICD-10-CM | POA: Diagnosis not present

## 2020-02-21 DIAGNOSIS — J45998 Other asthma: Secondary | ICD-10-CM | POA: Diagnosis not present

## 2020-02-21 DIAGNOSIS — M19011 Primary osteoarthritis, right shoulder: Secondary | ICD-10-CM | POA: Diagnosis not present

## 2020-02-21 DIAGNOSIS — S40011A Contusion of right shoulder, initial encounter: Secondary | ICD-10-CM | POA: Diagnosis not present

## 2020-02-21 DIAGNOSIS — I2510 Atherosclerotic heart disease of native coronary artery without angina pectoris: Secondary | ICD-10-CM | POA: Diagnosis not present

## 2020-02-21 DIAGNOSIS — I252 Old myocardial infarction: Secondary | ICD-10-CM | POA: Diagnosis not present

## 2020-02-21 DIAGNOSIS — I5032 Chronic diastolic (congestive) heart failure: Secondary | ICD-10-CM | POA: Diagnosis not present

## 2020-02-21 DIAGNOSIS — S8001XA Contusion of right knee, initial encounter: Secondary | ICD-10-CM | POA: Diagnosis not present

## 2020-02-21 DIAGNOSIS — Z9181 History of falling: Secondary | ICD-10-CM | POA: Diagnosis not present

## 2020-03-07 ENCOUNTER — Encounter: Payer: Self-pay | Admitting: Nurse Practitioner

## 2020-03-07 ENCOUNTER — Non-Acute Institutional Stay: Payer: Medicare Other | Admitting: Nurse Practitioner

## 2020-03-07 ENCOUNTER — Other Ambulatory Visit: Payer: Self-pay

## 2020-03-07 DIAGNOSIS — I5032 Chronic diastolic (congestive) heart failure: Secondary | ICD-10-CM

## 2020-03-07 DIAGNOSIS — Z515 Encounter for palliative care: Secondary | ICD-10-CM

## 2020-03-07 NOTE — Progress Notes (Signed)
[facility] Consult Note Telephone: [phone]  Fax: [phone]  PATIENT NAME: Lydia King DOB: 1930-05-08 MRN: [id number] Lydia [facility]  Responsible party:Lydia King daughter [phone] or [phone]  RECOMMENDATIONS and PLAN: 1.Advance Care Planning; DNR in [facility] [place]  2.Goals of Care: Goals include to maximize quality of life and symptom management. Our advance care planning conversation included a discussion about:   The value and importance of advance care planning  Exploration of personal, cultural or spiritual beliefs that might influence medical decisions  Exploration of goals of care in the event of a sudden injury or illness  Identification and preparation of a healthcare agent  Review and updating or creation of anadvance directive document.  3.Palliative care encounter; Palliative care encounter; Palliative medicine team will continue to support patient, patient's family, and medical team. Visit consisted of counseling and education dealing with the complex and emotionally intense issues of symptom management and palliative care in the setting of serious and potentially life-threatening illness  4. f/u4weeks for ongoing monitoring str, progression, planning for d/c plans or transition to LTC with family, goc  I spent 50 minutes providing this consultation starting at 1:00pm. More than 50% of the time in this consultation was spent coordinating communication.   HISTORY OF PRESENT ILLNESS:  [name] is a 87 y.o. year old female with multiple medical problems including chronic HF (EF 55% in July 2021), CAD with 2 stents placed, OSA with CPAP use, Asthma, DM2, Osteopenia,Osteoarthritis multiple joints and history of CVA with cortical blindness. Ms L[facility]. Ms  Lydia King is independent for her personal hygiene. She is ambulatory with her walker. No recent falls. Ms Lydia King does feed herself with fair with current weight 150.1. No recent wounds, infections, hospitalizations. Ms Lydia King has had several emergency department visits since last Palliative care visit. Staff endorses no other changes or concerns. Ms Lydia King is able to verbalize their needs. At present Ms Lydia King is sitting up in bed reading. Ms Lydia King appears comfortable, elderly, no visitors present. I visited and observed Ms Lydia King. We talked about purpose of Palliative care visit. Ms Lydia King in agreement. We talked about how she was feeling today. Ms Lydia King endorses she is doing okay. She is having more pain in that right shoulder. Ms Lydia King endorses typically when she has pain like that she gets an injection and has not had one in sometime. Ms. Lydia King just did receive pain medication which does help. We talked about her right shoulder in chronic pain intermittently. We talked about her blood pressure being a little high likely secondary to pain. We talked about transitioning to a new room with her roommate. Ms Lydia King talked about missing the deer that used to look in their window. We talked about the birds outside with the bird feeders. Ms Lydia King talked at length about her roommate and how grateful she is to have her. We talked about town just residing at a facility. Ms. Lydia King endorses things seem to be better. Ms. Lydia King endorses a few nights ago a gentleman wandered in their room in the middle of the night though when she yelled for staff he left. Ms Lydia King endorses she feels like he probably was just lost and could not find his room. We talked about coping strategies. Ms Lydia King endorses she feels like she is finally settled in. We talked about her daughter Lydia Janus, Ms Lydia King  endorses she is a Pharmacist, hospital and recently went back to school. Asked Ms Lydia King if she  wishes to have Orthopedic referral for re-evaluation for injection and she declined at this time. Medical goals reviewed. We talked about role Palliative care and plan of care. Therapeutic listening, emotional support provided. Most Palliative care visit supportive role. Ms Lydia King does appear stable at present time. I have attempted to contact her daughter, message left with contact information. I have updated nursing staff in the new changes that current time to plan of care  Palliative Care was asked to help to continue to address goals of care.   CODE STATUS: DNR  PPS: 50% HOSPICE ELIGIBILITY/DIAGNOSIS: TBD  PAST MEDICAL HISTORY:  Past Medical History:  Diagnosis Date  . (HFpEF) heart failure with preserved ejection fraction ([place])    a. 06/2019 Echo ([facility]): EF >55%, mild conc LVH. Mild-mod AS, mild MR/TR.  Marland Kitchen Anemia    in past (from blood loss at hiatal hernia)  . Asthma   . CAD (coronary artery disease)    a. 05/2019 MI/PCI: ost LCX (rota + cba-->3.0x16 Synergy DES), LAD 90 (3.0x16 Synergy DES).  . Charcot-Marie-Tooth disease    walks with cane (fairly controlled)  . Diverticulosis   . DVT (deep venous thrombosis) ([place]) 2003   after sx  . GERD (gastroesophageal reflux disease)   . Grief reaction    on fluoxetine  . Headache(784.0)    migraine  . Hiatal hernia   . Hyperlipidemia   . Hypertension   . OA (ocular albinism) ([place])   . Old myocardial infarction   . Osteopenia   . PUD (peptic ulcer disease)    gastric, past  . Shingles   . Stroke ([place]) 8/10   posterior reversible encephalopathy syndrome  . Urine incontinence     SOCIAL HX:  Social History   Tobacco Use  . Smoking status: Never Smoker  . Smokeless tobacco: Never Used  Substance Use Topics  . Alcohol use: No    ALLERGIES:  Allergies  Allergen Reactions  . Morphine And Related Anaphylaxis  . Other Shortness Of Breath and Other (See Comments)  . Shrimp [Shellfish Allergy] Anaphylaxis     Anaphylaxis-swelling of throat/sob    . Amlodipine Besylate     REACTION: edema  . Atorvastatin     REACTION: pain- could not walk  . Ciprofloxacin Other (See Comments)    REACTION: unkown Trouble breathing  . Influenza Vaccines Other (See Comments)    Arm swelling, redness, chills, fever  . Iodinated Diagnostic Agents   . Nitrofurantoin   . Omeprazole     REACTION: not effective  . Pantoprazole Sodium     REACTION: does not work well  . Penicillin G Hives    ITCHY RASH  . Penicillins     REACTION: itchy rash  . Simvastatin     REACTION: leg pain  . Sulfonamide Derivatives     REACTION: itchy rash  . Tramadol Other (See Comments)    Took for years, but then told this was potential cause of prior strokes and she should not ever take again  . Codeine Rash    REACTION: itchy rash ITCHY RASH, also breathing trouble  . Prednisone Other (See Comments)    REACTION: RASH.// SHORTNESS OF BREATH Prior possible flushing reaction in face, but tolerated it in 2020 and 2021 up to 20mg      PHYSICAL EXAM:   General: NAD, pleasant female Cardiovascular: regular rate and rhythm Pulmonary: clear ant fields  Neurological: ambulatory with walker  Shai Mckenzie Ihor Gully, NP

## 2020-03-14 ENCOUNTER — Emergency Department: Payer: Medicare Other

## 2020-03-14 ENCOUNTER — Emergency Department
Admission: EM | Admit: 2020-03-14 | Discharge: 2020-03-14 | Disposition: A | Discharge status: Home / Self Care | Payer: Medicare Other | Attending: Emergency Medicine | Admitting: Emergency Medicine

## 2020-03-14 ENCOUNTER — Other Ambulatory Visit: Payer: Self-pay

## 2020-03-14 ENCOUNTER — Encounter: Payer: Self-pay | Admitting: *Deleted

## 2020-03-14 DIAGNOSIS — W19XXXA Unspecified fall, initial encounter: Secondary | ICD-10-CM

## 2020-03-14 DIAGNOSIS — S8001XA Contusion of right knee, initial encounter: Secondary | ICD-10-CM | POA: Insufficient documentation

## 2020-03-14 DIAGNOSIS — Z7984 Long term (current) use of oral hypoglycemic drugs: Secondary | ICD-10-CM | POA: Diagnosis not present

## 2020-03-14 DIAGNOSIS — Z7902 Long term (current) use of antithrombotics/antiplatelets: Secondary | ICD-10-CM | POA: Diagnosis not present

## 2020-03-14 DIAGNOSIS — S40011A Contusion of right shoulder, initial encounter: Secondary | ICD-10-CM

## 2020-03-14 DIAGNOSIS — W01198A Fall on same level from slipping, tripping and stumbling with subsequent striking against other object, initial encounter: Secondary | ICD-10-CM | POA: Diagnosis not present

## 2020-03-14 DIAGNOSIS — I5032 Chronic diastolic (congestive) heart failure: Secondary | ICD-10-CM | POA: Diagnosis not present

## 2020-03-14 DIAGNOSIS — Z86718 Personal history of other venous thrombosis and embolism: Secondary | ICD-10-CM | POA: Diagnosis not present

## 2020-03-14 DIAGNOSIS — Z7982 Long term (current) use of aspirin: Secondary | ICD-10-CM | POA: Diagnosis not present

## 2020-03-14 DIAGNOSIS — I6529 Occlusion and stenosis of unspecified carotid artery: Secondary | ICD-10-CM | POA: Diagnosis not present

## 2020-03-14 DIAGNOSIS — J45909 Unspecified asthma, uncomplicated: Secondary | ICD-10-CM | POA: Diagnosis not present

## 2020-03-14 DIAGNOSIS — E1169 Type 2 diabetes mellitus with other specified complication: Secondary | ICD-10-CM | POA: Diagnosis not present

## 2020-03-14 DIAGNOSIS — Z79899 Other long term (current) drug therapy: Secondary | ICD-10-CM | POA: Insufficient documentation

## 2020-03-14 DIAGNOSIS — I110 Hypertensive heart disease with heart failure: Secondary | ICD-10-CM | POA: Diagnosis not present

## 2020-03-14 DIAGNOSIS — M9701XA Periprosthetic fracture around internal prosthetic right hip joint, initial encounter: Secondary | ICD-10-CM | POA: Diagnosis not present

## 2020-03-14 DIAGNOSIS — S43001A Unspecified subluxation of right shoulder joint, initial encounter: Secondary | ICD-10-CM | POA: Diagnosis not present

## 2020-03-14 DIAGNOSIS — Z96653 Presence of artificial knee joint, bilateral: Secondary | ICD-10-CM | POA: Diagnosis not present

## 2020-03-14 DIAGNOSIS — M5459 Other low back pain: Secondary | ICD-10-CM | POA: Diagnosis not present

## 2020-03-14 DIAGNOSIS — Y9289 Other specified places as the place of occurrence of the external cause: Secondary | ICD-10-CM | POA: Insufficient documentation

## 2020-03-14 DIAGNOSIS — M545 Low back pain, unspecified: Secondary | ICD-10-CM | POA: Insufficient documentation

## 2020-03-14 DIAGNOSIS — S0990XA Unspecified injury of head, initial encounter: Secondary | ICD-10-CM | POA: Diagnosis not present

## 2020-03-14 DIAGNOSIS — M19011 Primary osteoarthritis, right shoulder: Secondary | ICD-10-CM | POA: Diagnosis not present

## 2020-03-14 DIAGNOSIS — I2510 Atherosclerotic heart disease of native coronary artery without angina pectoris: Secondary | ICD-10-CM | POA: Diagnosis not present

## 2020-03-14 DIAGNOSIS — I6389 Other cerebral infarction: Secondary | ICD-10-CM | POA: Diagnosis not present

## 2020-03-14 MED ORDER — TRAMADOL HCL 50 MG PO TABS
50.0000 mg | ORAL_TABLET | Freq: Once | ORAL | Status: AC
  Administered 2020-03-14: 50 mg via ORAL
  Filled 2020-03-14: qty 1

## 2020-03-14 MED ORDER — ONDANSETRON 4 MG PO TBDP
4.0000 mg | ORAL_TABLET | Freq: Once | ORAL | Status: DC
  Filled 2020-03-14: qty 1

## 2020-03-14 NOTE — Discharge Instructions (Addendum)
Please continue with Tylenol and tramadol as needed for pain.  Return to the ER for any worsening headaches, vomiting, vision changes or any urgent changes in your health.  You may apply ice to the shoulder, day 20 minutes every hour for the next 2 to 3 days.  If persistent joint pain in 1 week would recommend recheck with orthopedics.

## 2020-03-14 NOTE — ED Provider Notes (Signed)
[facility] EMERGENCY DEPARTMENT Provider Note   CSN: [id number] Arrival date & time: 03/14/20  2003     History Chief Complaint  Patient presents with  . Fall    Lydia King is a 87 y.o. female presents to the emergency department for evaluation of a fall at [place].  Patient was trying to get something out of a drawer, she states her knees gave way and she fell onto both her knees hitting her head on the door then falling backwards onto her back and hitting the back of her head on the mattress.  She has a history of bilateral total knee arthroplasty with right knee distal femoral periprosthetic fracture 1 year ago that was plated at [facility].  She has been doing well with this.  She has been ambulatory since the fall.  Patient complains of increased right knee pain, right shoulder pain, low back pain and a headache.  Patient had a headache prior to her fall today, states this feels like sinus headache.  She denies any cough congestion runny nose or fevers.  No chest pain or shortness of breath.  Patient's denies any LOC, nausea or vomiting.  No vision changes.  She denies any neck pain, thoracic back pain.  She does have midline lower back pain along the lumbosacral junction with no lower extremity numbness tingling or radicular symptoms.  Her right knee pain is on the anterior aspect of the knee with no groin or thigh pain.  Her right shoulder pain is present throughout, has a history of severe osteoarthritis throughout the right shoulder.  Patient is on Eliquis.  She was given Tylenol earlier prior to the fall for chronic pain.  She is also on tramadol 50 mg 3 times daily and has not taken her second dose for the day, she is requesting this currently.  HPI     Past Medical History:  Diagnosis Date  . (HFpEF) heart failure with preserved ejection fraction ([place])    a. 06/2019 Echo ([facility]): EF >55%, mild conc LVH. Mild-mod AS, mild MR/TR.  Marland Kitchen Anemia    in  past (from blood loss at hiatal hernia)  . Asthma   . CAD (coronary artery disease)    a. 05/2019 MI/PCI: ost LCX (rota + cba-->3.0x16 Synergy DES), LAD 90 (3.0x16 Synergy DES).  . Charcot-Marie-Tooth disease    walks with cane (fairly controlled)  . Diverticulosis   . DVT (deep venous thrombosis) ([place]) 2003   after sx  . GERD (gastroesophageal reflux disease)   . Grief reaction    on fluoxetine  . Headache(784.0)    migraine  . Hiatal hernia   . Hyperlipidemia   . Hypertension   . OA (ocular albinism) ([place])   . Old myocardial infarction   . Osteopenia   . PUD (peptic ulcer disease)    gastric, past  . Shingles   . Stroke ([place]) 8/10   posterior reversible encephalopathy syndrome  . Urine incontinence     Patient Active Problem List   Diagnosis Date Noted  . Pain due to onychomycosis of toenails of both feet 11/24/2019  . Anxiety 11/09/2019  . Chronic diastolic CHF (congestive heart failure) ([place]) 11/03/2019  . Paraesophageal hiatal hernia   . Chest pain 11/02/2019  . CAD (coronary artery disease) 08/01/2019  . Anemia 08/01/2019  . Acute on chronic congestive heart failure ([place]) 07/31/2019  . Acute decompensated heart failure ([place]) 07/03/2019  . Bradycardia 06/17/2019  . Hypoalbuminemia 06/17/2019  . Severe  protein-calorie malnutrition ([place]) 06/17/2019  . Aortic valve stenosis 06/15/2019  . Mitral valve regurgitation 06/15/2019  . Positive cardiac stress test 06/13/2019  . Closed fracture of right distal femur ([place]) 03/15/2019  . Periprosthetic fracture around internal prosthetic right knee joint 03/15/2019  . Hyperlipidemia associated with type 2 diabetes mellitus ([place]) 07/27/2018  . Sinus congestion 02/05/2018  . Vertigo 05/06/2017  . OSA (obstructive sleep apnea) 01/02/2016  . Pedal edema 01/02/2016  . Fall in home 12/11/2015  . Poor balance 12/11/2015  . Low back pain with sciatica 10/02/2015  . Right hip pain 10/02/2015  . Hearing loss 04/18/2015  .  Encounter for Medicare annual wellness exam 10/04/2013  . Type 2 diabetes mellitus without complications ([place]) 62/13/0865  . History of CVA (cerebrovascular accident) 09/12/2008  . Osteoarthritis of multiple joints 05/30/2008  . Osteoporosis 05/30/2008  . Vitamin D deficiency 07/14/2007  . Sleep apnea 07/14/2007  . Essential hypertension 11/30/2006  . Asthma 11/30/2006  . H/O gastroesophageal reflux (GERD) 11/30/2006  . G I BLEED 11/30/2006  . DEEP VENOUS THROMBOPHLEBITIS, HX OF 11/30/2006    Past Surgical History:  Procedure Laterality Date  . carotid dopplers  8/10  . COLONOSCOPY  2002  . HERNIA REPAIR  2002   bleeding  . HIATAL HERNIA REPAIR  2000   needed blood transfusion  . Stroke  8/10   hosp stroke/ posterior (vs posterior reversible encephalopathy syndrome) - d/c on Plavix  . TEE normal  7/84   no emoblic source  . TOTAL KNEE ARTHROPLASTY  03,06   Bilateral  . VESICOVAGINAL FISTULA CLOSURE W/ TAH  1964   with bleeding and cervical dysplasia     OB History   No obstetric history on file.     Family History  Problem Relation Age of Onset  . Cerebral aneurysm Son   . Cerebral aneurysm Father   . Hyperlipidemia Father   . Alcohol abuse Father   . Stroke Mother   . Cervical cancer Sister   . Hyperlipidemia Brother   . Alcohol abuse Brother   . Stroke Brother   . Diabetes Brother   . Diabetes Sister   . Sarcoidosis Sister   . Other Sister        OP/ Broken hip  . Other Other        whole family has charcot marie tooth  . Ovarian cancer Sister     Social History   Tobacco Use  . Smoking status: Never Smoker  . Smokeless tobacco: Never Used  Vaping Use  . Vaping Use: Never used  Substance Use Topics  . Alcohol use: No  . Drug use: No    Home Medications Prior to Admission medications   Medication Sig Start Date End Date Taking? Authorizing Provider  acetaminophen (TYLENOL) 500 MG tablet Take 2 tablets (1,000 mg total) by mouth in the morning,  at noon, and at bedtime. 11/04/19   Fritzi Mandes, MD  alum & mag hydroxide-simeth (MAALOX/MYLANTA) 200-200-20 MG/5ML suspension Take 30 mLs by mouth in the morning, at noon, and at bedtime.    [provider]  aspirin EC 81 MG EC tablet Take 1 tablet (81 mg total) by mouth daily. Swallow whole. 08/05/19   Bonnielee Haff, MD  azelastine (ASTELIN) 0.1 % nasal spray Place 1 spray into both nostrils 2 (two) times daily. Use in each nostril as directed 08/26/17   Cuthriell, Charline Bills, PA-C  Blood Glucose Monitoring Suppl (FREESTYLE LITE) DEVI Use to check blood glucose daily  and as needed for diabetes 10/11/17   Tower, Wynelle Fanny, MD  Calcium Carbonate-Vitamin D 600-400 MG-UNIT tablet Take 1 tablet by mouth daily with breakfast.     [provider]  carvedilol (COREG) 6.25 MG tablet Take 1 tablet (6.25 mg total) by mouth 2 (two) times daily with a meal. 11/04/19   Fritzi Mandes, MD  Cholecalciferol (VITAMIN D) 2000 UNITS CAPS Take 2 capsules by mouth daily.      [provider]  cloNIDine (CATAPRES) 0.1 MG tablet Take 0.1 mg by mouth 3 (three) times daily as needed.    [provider]  clopidogrel (PLAVIX) 75 MG tablet TAKE 1 TABLET BY MOUTH EVERY DAY Patient taking differently: Take 75 mg by mouth daily.  07/26/19   Tower, Wynelle Fanny, MD  cyanocobalamin 1000 MCG tablet Take 1,000 mcg by mouth daily.    [provider]  esomeprazole (NEXIUM) 40 MG capsule Take 1 capsule (40 mg total) by mouth daily before breakfast. 07/27/18   Tower, Wynelle Fanny, MD  famotidine (PEPCID) 20 MG tablet Take 20 mg by mouth at bedtime.    [provider]  Flaxseed, Linseed, (FLAX SEED OIL) 1000 MG CAPS Take 1 capsule by mouth daily.      [provider]  FLUoxetine (PROZAC) 20 MG capsule Take 20 mg by mouth daily. 11/20/19   [provider]  FLUoxetine (PROZAC) 40 MG capsule Take 1 capsule (40 mg total) by mouth daily. 11/09/19   Tower, Wynelle Fanny, MD  furosemide (LASIX) 40 MG  tablet Take 1 tablet (40 mg total) by mouth daily. 11/09/19   Tower, Marne A, MD  glucose blood (FREESTYLE LITE) test strip Use to check blood sugar once daily for DM (dx. H88.502) 11/08/18   Tower, Wynelle Fanny, MD  isosorbide mononitrate (IMDUR) 30 MG 24 hr tablet Take 1 tablet (30 mg total) by mouth 2 (two) times daily. 11/04/19 02/02/20  Fritzi Mandes, MD  Lancets (FREESTYLE) lancets Use as instructedUse to check blood sugar once daily for DM (dx. E11.618) 11/08/18   Tower, Wynelle Fanny, MD  lidocaine (LMX) 4 % cream Apply 1 application topically in the morning and at bedtime. To low back and shoulder areas 11/11/19   Tower, Chacra A, MD  lisinopril (ZESTRIL) 20 MG tablet Take 1 tablet (20 mg total) by mouth daily. 11/09/19   Tower, Wynelle Fanny, MD  loperamide (IMODIUM) 2 MG capsule Take 2 mg by mouth as needed for diarrhea or loose stools.    [provider]  loratadine (CLARITIN) 10 MG tablet TAKE 1 TABLET BY MOUTH EVERY DAY Patient taking differently: Take 10 mg by mouth daily.  02/28/19   Tower, Wynelle Fanny, MD  melatonin 3 MG TABS tablet Take 3 mg by mouth at bedtime.    [provider]  metFORMIN (GLUCOPHAGE XR) 500 MG 24 hr tablet Take 1 tablet (500 mg total) by mouth daily with breakfast. 11/04/19 11/03/20  Fritzi Mandes, MD  Multiple Vitamins-Minerals (PRESERVISION AREDS 2) CAPS Give one pill by mouth bid 11/09/19   Tower, Wynelle Fanny, MD  nitroGLYCERIN (NITROSTAT) 0.4 MG SL tablet Place under the tongue. 06/19/19 06/18/20  [provider]  omega-3 acid ethyl esters (LOVAZA) 1 g capsule TAKE 1 CAPSULE (1 G TOTAL) BY MOUTH DAILY. Patient taking differently: Take 1 g by mouth 2 (two) times daily.  07/26/19   Tower, Wynelle Fanny, MD  ondansetron (ZOFRAN) 4 MG tablet Take 4 mg by mouth every 4 (four) hours as needed for  nausea or vomiting.    [provider]  polyethylene glycol (MIRALAX / GLYCOLAX) 17 g packet Take 17 g by mouth daily. 11/11/19   Tower, Wynelle Fanny, MD  PROAIR HFA 108 [phone] Base)  MCG/ACT inhaler INHALE 2 PUFFS INTO THE LUNGS EVERY 4 (FOUR) HOURS AS NEEDED FOR WHEEZING OR SHORTNESS OF BREATH. 03/05/16   Tower, Wynelle Fanny, MD  rosuvastatin (CRESTOR) 20 MG tablet TAKE 1 TABLET BY MOUTH EVERYDAY AT BEDTIME Patient taking differently: Take 20 mg by mouth at bedtime. TAKE 1 TABLET BY MOUTH EVERYDAY AT BEDTIME 11/04/19   Fritzi Mandes, MD  sennosides-docusate sodium (SENOKOT-S) 8.6-50 MG tablet Take 2 tablets by mouth daily.    [provider]  trolamine salicylate (ASPERCREME/ALOE) 10 % cream Apply 1 application topically daily. To shoulder areas 11/11/19   Tower, Wynelle Fanny, MD    Allergies    Morphine and related, Other, Shrimp [shellfish allergy], Amlodipine besylate, Atorvastatin, Ciprofloxacin, Influenza vaccines, Iodinated diagnostic agents, Nitrofurantoin, Omeprazole, Pantoprazole sodium, Penicillin g, Penicillins, Simvastatin, Sulfonamide derivatives, Tramadol, Codeine, and Prednisone  Review of Systems   Review of Systems  Constitutional: Negative for activity change.  Eyes: Negative for pain and visual disturbance.  Respiratory: Negative for shortness of breath.   Cardiovascular: Negative for chest pain and leg swelling.  Gastrointestinal: Negative for abdominal pain.  Genitourinary: Negative for flank pain and pelvic pain.  Musculoskeletal: Positive for arthralgias and joint swelling. Negative for gait problem, myalgias, neck pain and neck stiffness.  Skin: Negative for wound.  Neurological: Positive for headaches. Negative for dizziness, syncope, weakness, light-headedness and numbness.  Psychiatric/Behavioral: Negative for confusion and decreased concentration.    Physical Exam Updated Vital Signs BP (!) 160/75 (BP Location: Right Arm)   Pulse 70   Temp 98.7 F (37.1 C) (Oral)   Resp 18   Ht 4\' 11"  (1.499 m)   Wt 67.6 kg   SpO2 95%   BMI 30.09 kg/m   Physical Exam Constitutional:      Appearance: She is well-developed and well-nourished.  HENT:      Head: Normocephalic and atraumatic.     Right Ear: External ear normal.     Left Ear: External ear normal.     Nose: Nose normal.  Eyes:     Extraocular Movements: EOM normal.     Conjunctiva/sclera: Conjunctivae normal.     Pupils: Pupils are equal, round, and reactive to light.  Cardiovascular:     Rate and Rhythm: Normal rate.  Pulmonary:     Effort: Pulmonary effort is normal. No respiratory distress.     Breath sounds: Normal breath sounds.  Abdominal:     Palpations: Abdomen is soft.     Tenderness: There is no abdominal tenderness.  Musculoskeletal:        General: No deformity or edema. Normal range of motion.     Cervical back: Normal range of motion.     Comments: Examination of the cervical spinous process shows no spinous process tenderness.  No paravertebral muscle tenderness.  She is tender to the right shoulder along the proximal humerus but no clavicle tenderness.  She has exam findings consistent with severe right shoulder arthritis due to painful limited external rotation.  Left shoulder has some limitations in external rotation but no tenderness to palpation.  No clavicle tenderness on the left side.  Patient has no sternal tenderness or abdominal tenderness.  She is nontender along the thoracic spinous process but does have tenderness along the midline lumbar spine lumbosacral  junction.  She has good range of motion of both hips with internal X rotation with no discomfort.  She is able to straight leg raise bilaterally.  She is tender along the anterior aspect of the knee nontender along the anterior aspect of the left knee.  No leg length abnormalities.  No swelling or edema or bruising throughout the lower extremities.  Skin:    General: Skin is warm and dry.     Findings: No rash.  Neurological:     General: No focal deficit present.     Mental Status: She is alert and oriented to person, place, and time. Mental status is at baseline.     Cranial Nerves: No cranial  nerve deficit.     Coordination: Coordination normal.  Psychiatric:        Mood and Affect: Mood and affect normal.        Behavior: Behavior normal.     ED Results / Procedures / Treatments   Labs (all labs ordered are listed, but only abnormal results are displayed) Labs Reviewed - No data to display  EKG None  Radiology DG Lumbar Spine 2-3 Views  Result Date: 03/14/2020 CLINICAL DATA:  Fall with low back pain. EXAM: LUMBAR SPINE - 2-3 VIEW COMPARISON:  12/12/2017 FINDINGS: Bones are markedly demineralized. No gross lumbar spine fracture evident. Loss of disc height noted diffusely in the lumbar spine. Lower lumbar facet degeneration evident. Atherosclerotic calcification visible in the wall of the abdominal aorta. IMPRESSION: 1. Marked bony demineralization without gross lumbar spine fracture identified. 2. Lumbar degenerative changes. 3.  Aortic Atherosclerois (ICD10-170.0) Electronically Signed   By: Misty Stanley M.D.   On: 03/14/2020 21:15   DG Shoulder Right  Result Date: 03/14/2020 CLINICAL DATA:  Injury EXAM: RIGHT SHOULDER - 2+ VIEW COMPARISON:  12/12/2017 FINDINGS: Internal rotation, external rotation, transscapular views of the right shoulder are obtained. There is progressive severe osteoarthritis of the right shoulder. Severe glenohumeral joint space narrowing and osteophyte formation, with bony remodeling of the glenoid. Complete loss of the acromial humeral interval consistent with chronic longstanding rotator cuff tear. Marked spurring of the acromion process. There is widening of the acromioclavicular joint space which may reflect subluxation. The clavicular head is subluxed approximately 9 mm superiorly from the acromion. No acute fracture.  Right chest is clear. IMPRESSION: 1. Widening and superior subluxation of the acromioclavicular joint as above, which could reflect AC separation versus progressive degenerative change. 2. Progressive severe degenerative changes of the  right shoulder as above. No acute fracture. Electronically Signed   By: Randa Ngo M.D.   On: 03/14/2020 20:51   CT Head Wo Contrast  Result Date: 03/14/2020 CLINICAL DATA:  [place] and hit right temple EXAM: CT HEAD WITHOUT CONTRAST TECHNIQUE: Contiguous axial images were obtained from the base of the skull through the vertex without intravenous contrast. COMPARISON:  CT brain 01/03/2020 FINDINGS: Brain: No acute territorial infarction, hemorrhage or intracranial mass. Multifocal chronic infarcts involving the bilateral occipital lobes, bilateral cerebellum, and right thalamus. Mild hypodensity in the white matter consistent with chronic small vessel ischemic change. Stable ventricle size. Vascular: No hyperdense vessels. Vertebral and carotid vascular calcification Skull: Normal. Negative for fracture or focal lesion. Sinuses/Orbits: No acute finding. Other: None IMPRESSION: 1. No CT evidence for acute intracranial abnormality. 2. Multifocal chronic infarcts as described above. 3. Chronic small vessel ischemic changes of the white matter. Electronically Signed   By: Donavan Foil M.D.   On: 03/14/2020 21:38   DG  Knee Complete 4 Views Right  Result Date: 03/14/2020 CLINICAL DATA:  Injury EXAM: RIGHT KNEE - COMPLETE 4+ VIEW COMPARISON:  12/14/2019 FINDINGS: Frontal, bilateral oblique, and lateral views of the right knee are obtained. Stable position of the 3 component right knee arthroplasty. The periprosthetic distal right femoral fracture seen previously is again identified, with stable position of the lateral plate and screw fixation. No change in alignment or bony callus. There are no new bony abnormalities. Bony structures are diffusely osteopenic. The soft tissues are stable. No joint effusion. IMPRESSION: 1. Stable ORIF spanning a distal right femoral fracture, with no change in alignment. 2. Unremarkable right knee arthroplasty. 3. No new bony abnormality. Electronically Signed   By: Randa Ngo  M.D.   On: 03/14/2020 20:53    Procedures Procedures   Medications Ordered in ED Medications  ondansetron (ZOFRAN-ODT) disintegrating tablet 4 mg (4 mg Oral Patient Refused/Not Given 03/14/20 2157)  traMADol (ULTRAM) tablet 50 mg (50 mg Oral Given 03/14/20 2059)    ED Course  I have reviewed the triage vital signs and the nursing notes.  Pertinent labs & imaging results that were available during my care of the patient were reviewed by me and considered in my medical decision making (see chart for details).    MDM Rules/Calculators/A&P                          87 year old female with mechanical fall earlier today.  Patient hit her head, did not lose consciousness.  Having mild headache.  No neurological deficits.  CT of the head negative.  Patient ambulatory immediately after the accident.  Also complaining of lumbar spine pain, shoulder, knee pain, x-rays negative.  Patient doing well with Tylenol, tramadol and topical rubs provided by patient's family.  All symptoms seem to be improving significantly.  Patient stable and ready for discharge. Final Clinical Impression(s) / ED Diagnoses Final diagnoses:  Injury of head, initial encounter  Fall, initial encounter  Contusion of right knee, initial encounter  Contusion of right shoulder, initial encounter  Acute midline low back pain without sciatica    Rx / DC Orders ED Discharge Orders    None       Renata Caprice 03/14/20 2206    Nance Pear, MD 03/14/20 2229

## 2020-03-14 NOTE — ED Triage Notes (Signed)
Pt to triage via wheelchair.  Pt brought in by daughter from [facility].  Pt fell getting something out of a drawer onto the floor.pt has right knee and right shoulder pain . Pt alert.

## 2020-03-16 DIAGNOSIS — K219 Gastro-esophageal reflux disease without esophagitis: Secondary | ICD-10-CM | POA: Diagnosis not present

## 2020-03-16 DIAGNOSIS — E119 Type 2 diabetes mellitus without complications: Secondary | ICD-10-CM | POA: Diagnosis not present

## 2020-03-16 DIAGNOSIS — I252 Old myocardial infarction: Secondary | ICD-10-CM | POA: Diagnosis not present

## 2020-03-16 DIAGNOSIS — G43909 Migraine, unspecified, not intractable, without status migrainosus: Secondary | ICD-10-CM | POA: Diagnosis not present

## 2020-03-16 DIAGNOSIS — I5040 Unspecified combined systolic (congestive) and diastolic (congestive) heart failure: Secondary | ICD-10-CM | POA: Diagnosis not present

## 2020-03-16 DIAGNOSIS — I340 Nonrheumatic mitral (valve) insufficiency: Secondary | ICD-10-CM | POA: Diagnosis not present

## 2020-03-16 DIAGNOSIS — D649 Anemia, unspecified: Secondary | ICD-10-CM | POA: Diagnosis not present

## 2020-03-16 DIAGNOSIS — I10 Essential (primary) hypertension: Secondary | ICD-10-CM | POA: Diagnosis not present

## 2020-03-16 DIAGNOSIS — Z6829 Body mass index (BMI) 29.0-29.9, adult: Secondary | ICD-10-CM | POA: Diagnosis not present

## 2020-03-16 DIAGNOSIS — M1990 Unspecified osteoarthritis, unspecified site: Secondary | ICD-10-CM | POA: Diagnosis not present

## 2020-03-16 DIAGNOSIS — Z8616 Personal history of COVID-19: Secondary | ICD-10-CM | POA: Diagnosis not present

## 2020-03-16 DIAGNOSIS — E7849 Other hyperlipidemia: Secondary | ICD-10-CM | POA: Diagnosis not present

## 2020-03-20 DIAGNOSIS — L299 Pruritus, unspecified: Secondary | ICD-10-CM | POA: Diagnosis not present

## 2020-03-20 DIAGNOSIS — Z91013 Allergy to seafood: Secondary | ICD-10-CM | POA: Diagnosis not present

## 2020-03-22 DIAGNOSIS — M25551 Pain in right hip: Secondary | ICD-10-CM | POA: Diagnosis not present

## 2020-03-22 DIAGNOSIS — Z9181 History of falling: Secondary | ICD-10-CM | POA: Diagnosis not present

## 2020-03-23 DIAGNOSIS — M6281 Muscle weakness (generalized): Secondary | ICD-10-CM | POA: Diagnosis not present

## 2020-03-23 DIAGNOSIS — S72402D Unspecified fracture of lower end of left femur, subsequent encounter for closed fracture with routine healing: Secondary | ICD-10-CM | POA: Diagnosis not present

## 2020-03-23 DIAGNOSIS — M25551 Pain in right hip: Secondary | ICD-10-CM | POA: Diagnosis not present

## 2020-03-23 DIAGNOSIS — R278 Other lack of coordination: Secondary | ICD-10-CM | POA: Diagnosis not present

## 2020-03-28 DIAGNOSIS — M545 Low back pain, unspecified: Secondary | ICD-10-CM | POA: Diagnosis not present

## 2020-03-28 DIAGNOSIS — M25519 Pain in unspecified shoulder: Secondary | ICD-10-CM | POA: Diagnosis not present

## 2020-04-27 DIAGNOSIS — E119 Type 2 diabetes mellitus without complications: Secondary | ICD-10-CM | POA: Diagnosis not present

## 2020-04-27 DIAGNOSIS — B3789 Other sites of candidiasis: Secondary | ICD-10-CM | POA: Diagnosis not present

## 2020-04-29 ENCOUNTER — Encounter: Payer: Self-pay | Admitting: Family Medicine

## 2020-04-30 NOTE — Telephone Encounter (Signed)
Form in your inbox 

## 2020-05-02 DIAGNOSIS — E119 Type 2 diabetes mellitus without complications: Secondary | ICD-10-CM | POA: Diagnosis not present

## 2020-05-02 DIAGNOSIS — D519 Vitamin B12 deficiency anemia, unspecified: Secondary | ICD-10-CM | POA: Diagnosis not present

## 2020-05-02 DIAGNOSIS — G600 Hereditary motor and sensory neuropathy: Secondary | ICD-10-CM | POA: Diagnosis not present

## 2020-05-02 DIAGNOSIS — I5021 Acute systolic (congestive) heart failure: Secondary | ICD-10-CM | POA: Diagnosis not present

## 2020-05-02 DIAGNOSIS — I639 Cerebral infarction, unspecified: Secondary | ICD-10-CM | POA: Diagnosis not present

## 2020-05-03 DIAGNOSIS — I10 Essential (primary) hypertension: Secondary | ICD-10-CM | POA: Diagnosis not present

## 2020-05-03 DIAGNOSIS — E1140 Type 2 diabetes mellitus with diabetic neuropathy, unspecified: Secondary | ICD-10-CM | POA: Diagnosis not present

## 2020-05-03 DIAGNOSIS — R531 Weakness: Secondary | ICD-10-CM | POA: Diagnosis not present

## 2020-05-03 DIAGNOSIS — F329 Major depressive disorder, single episode, unspecified: Secondary | ICD-10-CM | POA: Diagnosis not present

## 2020-05-03 DIAGNOSIS — E785 Hyperlipidemia, unspecified: Secondary | ICD-10-CM | POA: Diagnosis not present

## 2020-05-03 DIAGNOSIS — Z6829 Body mass index (BMI) 29.0-29.9, adult: Secondary | ICD-10-CM | POA: Diagnosis not present

## 2020-05-11 ENCOUNTER — Non-Acute Institutional Stay: Payer: Medicare Other | Admitting: Nurse Practitioner

## 2020-05-11 ENCOUNTER — Other Ambulatory Visit: Payer: Self-pay

## 2020-05-11 ENCOUNTER — Encounter: Payer: Self-pay | Admitting: Nurse Practitioner

## 2020-05-11 VITALS — BP 106/55 | HR 74 | Temp 98.4°F | Resp 18 | Wt 144.0 lb

## 2020-05-11 DIAGNOSIS — Z515 Encounter for palliative care: Secondary | ICD-10-CM | POA: Diagnosis not present

## 2020-05-11 DIAGNOSIS — K5900 Constipation, unspecified: Secondary | ICD-10-CM

## 2020-05-11 NOTE — Progress Notes (Addendum)
[facility] Consult Note Telephone: [phone]  Fax: [phone]    Date of encounter: 05/11/20 PATIENT NAME: Lydia King [place] [id number]   [phone] (home)  DOB: 08/03/1930 MRN: [id number] PRIMARY CARE PROVIDER:   [facility], Wynelle Fanny, [place],  [place] [address] [phone]  RESPONSIBLE PARTY:    Contact Information    Name Relation Home Work [place] Daughter [phone]  [phone]   [name] Daughter   [phone]     I met face to face with patient in facility. Palliative Care was asked to follow this patient by consultation request of  Dr Reesa Chew to address advance care planning and complex medical decision making. This is a follow up visit  ASSESSMENT AND PLAN / RECOMMENDATIONS:   Advance Care Planning/Goals of Care: Goals include to maximize quality of life and symptom management. Our advance care planning conversation included a discussion about:     The value and importance of advance care planning   Experiences with loved ones who have been seriously ill or have died   Exploration of personal, cultural or spiritual beliefs that might influence medical decisions   Exploration of goals of care in the event of a sudden injury or illness   Identification and preparation of a healthcare agent   Review and updating or creation of an  advance directive document .  Decision not to resuscitate or to de-escalate disease focused treatments due to poor prognosis.  CODE STATUS: DNR  Symptom Management/Plan: RECOMMENDATIONS and PLAN: 1.Advance Care Planning; DNR in [place]  2.Goals of Care: Goals include to maximize quality of life and symptom management. Our advance care planning conversation included a discussion about:   The value and importance of advance care planning  Exploration of personal, cultural or  spiritual beliefs that might influence medical decisions  Exploration of goals of care in the event of a sudden injury or illness  Identification and preparation of a healthcare agent  Review and updating or creation of anadvance directive document.  3.Palliative care encounter; Palliative care encounter; Palliative medicine team will continue to support patient, patient's family, and medical team. Visit consisted of counseling and education dealing with the complex and emotionally intense issues of symptom management and palliative care in the setting of serious and potentially life-threatening illness  4.  Constipation; discussed BM pattern, Ms. Uzelac requests stool softening be administered daily rather than PRN. In agreement, notified Oval Linsey NP, in agreement. Talked about bowel regimen, bowel patterns, monitoring bm's. Increase oral fluids for hydration, nutrition  Follow up Palliative Care Visit: Palliative care will continue to follow for complex medical decision making, advance care planning, and clarification of goals. Return 8 weeks or prn.  I spent 35 minutes providing this consultation starting at 12:05pm. More than 50% of the time in this consultation was spent in counseling and care coordination.  PPS: 50%  HOSPICE ELIGIBILITY/DIAGNOSIS: TBD  Chief Complaint: complex medical decision making  HISTORY OF PRESENT ILLNESS:  Lydia King is a 87 y.o. year old female  with chronic HF (EF 55% in July 2021), CAD with 2 stents placed, OSA with CPAP use, Asthma, DM2, Osteopenia,Osteoarthritis multiple joints and history of CVA with cortical blindness. Ms. Madl continues to reside at [place]. Ms. Onnen continues to walk with her walker. Ms. Frees does require sistance with adls bathing, dressing. Ms. Whitelaw  does toilet herself. Ms. Jankovich does feed herself. Current weight 144 lbs with 1.5 lbs weight gain.  BMI 29.1. On diabetic diet with regular texture and regular liquid consistency. Ms. Schnitzer had a falls resulting in [facility]-ED visits since last PC visit. No further falls reported by staff or hospitalizations, wounds, infections. Staff endorses no new concerns. Ms. Risden is a DNR. At present, Ms. Catalina is sitting on the side of her bed in her room at [facility] working on a word puzzle. We talked about purpose of PC visit, Ms. Dinkel in agreement. We talked about how she has been feeling. Ms. Wolff endorses she has been doing well except her right shoulder continues to have some pain. We talked about arthritis. We talked about f/u orthopedic visit with possible injection which was in the previous plan but was not follow-ed through. Ms. Lutze endorses she was going to further talk to [name] her daughter about getting an Orthopedic visit. Discussed with Ms. Haskins current pain regimen with topical analgesics which seems to help. Ms. Nealis endorses she had the same problem with her bilateral knees which she received injections, then required knee replacements. We talked about progression of aging in relation to arthritis, mobility. Encouraged exercises. We talked about Ms. Ghattas appetite with is "too good". We talked about nutrition. We talked about bowel regimen. Ms. Belleau endorses sometimes the prn stool softener is not enough. Ms. Nell requests the stool softener be scheduled daily. In agreement, will discuss with primary. We talked about residing at [facility]. Ms. Mayeda talked about challenges trying to sleep, sometimes it is noisy during the middle of the night. Ms. Druck endorses she does bring her concerns to staff and they help her work it out. We talked about sleep hygiene, sleep pattern. We talked about Ms. Nofziger daily routine. We talked about medical goals of care. Discussed role PC in poc. Ms. Estrella talked about quality of life, things she does on a daily basis her  books, puzzles, interacts with her roommate, watches the birds at the bird feeder, talked about the female cardinal her roommate saw this week. Ms. Laguna talked about Easter. We talked about socializing and activities at the facility. Ms. Swailes endorses she prefers to stay in her room. We talked about her daughter [name]. Ms. Glazer in agreement to have [name] contacted for update on PC visit. I have attempted to contact [place], message left. Therapeutic listening, emotional support provided. I updated nursing staff. No new changes to current goc.   History obtained from review of EMR, discussion with primary team, and interview with facility staff and  Ms. Vierra.  I reviewed available labs, medications, imaging, studies and related documents from the EMR.  Records reviewed and summarized above.   ROS Full 14 system review of systems performed and negative with exception of: as per HPI.  Physical Exam Constitutional: NAD General: frail appearing, pleasant female EYES: lids intact,  ENMT: oral mucous membranes moist, CV: S1S2, RRR, mild LE edema Pulmonary: LCTA, no increased work of breathing, no cough, room air Abdomen: intake 100%, normo-active BS + 4 quadrants, soft and non tender,  GU: deferred MSK:  moves all extremities, ambulatory with walker Skin: warm and dry, no rashes or wounds on visible skin Neuro:  no generalized weakness,  no cognitive impairment Psych: non-anxious affect, A and O x 3  Questions and concerns were addressed. The patient/staff was encouraged to call with questions and/or concerns. Provided general support and encouragement, no other unmet needs identified  Thank you for the opportunity to participate in the care of Ms. Bazile.  The palliative care team will continue to follow. Please call our office at [phone] if we can be of additional assistance.   This chart was dictated using voice recognition software. Despite best efforts to proofread,  errors can occur which can change the documentation meaning.  Deloise Marchant Ihor Gully, NP , MSN, [facility]

## 2020-05-25 ENCOUNTER — Emergency Department: Payer: Medicare Other

## 2020-05-25 ENCOUNTER — Encounter: Payer: Self-pay | Admitting: *Deleted

## 2020-05-25 ENCOUNTER — Emergency Department
Admission: EM | Admit: 2020-05-25 | Discharge: 2020-05-25 | Disposition: A | Discharge status: Skilled Nursing Facility | Payer: Medicare Other | Attending: Emergency Medicine | Admitting: Emergency Medicine

## 2020-05-25 ENCOUNTER — Other Ambulatory Visit: Payer: Self-pay

## 2020-05-25 DIAGNOSIS — M79602 Pain in left arm: Secondary | ICD-10-CM | POA: Diagnosis not present

## 2020-05-25 DIAGNOSIS — Z7984 Long term (current) use of oral hypoglycemic drugs: Secondary | ICD-10-CM | POA: Insufficient documentation

## 2020-05-25 DIAGNOSIS — E119 Type 2 diabetes mellitus without complications: Secondary | ICD-10-CM | POA: Insufficient documentation

## 2020-05-25 DIAGNOSIS — R110 Nausea: Secondary | ICD-10-CM | POA: Diagnosis not present

## 2020-05-25 DIAGNOSIS — Z96653 Presence of artificial knee joint, bilateral: Secondary | ICD-10-CM | POA: Diagnosis not present

## 2020-05-25 DIAGNOSIS — I10 Essential (primary) hypertension: Secondary | ICD-10-CM | POA: Diagnosis not present

## 2020-05-25 DIAGNOSIS — Z79899 Other long term (current) drug therapy: Secondary | ICD-10-CM | POA: Insufficient documentation

## 2020-05-25 DIAGNOSIS — Z20822 Contact with and (suspected) exposure to covid-19: Secondary | ICD-10-CM | POA: Diagnosis not present

## 2020-05-25 DIAGNOSIS — J45909 Unspecified asthma, uncomplicated: Secondary | ICD-10-CM | POA: Insufficient documentation

## 2020-05-25 DIAGNOSIS — K449 Diaphragmatic hernia without obstruction or gangrene: Secondary | ICD-10-CM | POA: Diagnosis not present

## 2020-05-25 DIAGNOSIS — J9811 Atelectasis: Secondary | ICD-10-CM | POA: Diagnosis not present

## 2020-05-25 DIAGNOSIS — I5032 Chronic diastolic (congestive) heart failure: Secondary | ICD-10-CM | POA: Insufficient documentation

## 2020-05-25 DIAGNOSIS — R0902 Hypoxemia: Secondary | ICD-10-CM | POA: Diagnosis not present

## 2020-05-25 DIAGNOSIS — I350 Nonrheumatic aortic (valve) stenosis: Secondary | ICD-10-CM | POA: Diagnosis not present

## 2020-05-25 DIAGNOSIS — R079 Chest pain, unspecified: Secondary | ICD-10-CM

## 2020-05-25 DIAGNOSIS — Z7982 Long term (current) use of aspirin: Secondary | ICD-10-CM | POA: Diagnosis not present

## 2020-05-25 DIAGNOSIS — I110 Hypertensive heart disease with heart failure: Secondary | ICD-10-CM | POA: Diagnosis not present

## 2020-05-25 DIAGNOSIS — R0789 Other chest pain: Secondary | ICD-10-CM | POA: Diagnosis not present

## 2020-05-25 DIAGNOSIS — I2510 Atherosclerotic heart disease of native coronary artery without angina pectoris: Secondary | ICD-10-CM | POA: Insufficient documentation

## 2020-05-25 DIAGNOSIS — K219 Gastro-esophageal reflux disease without esophagitis: Secondary | ICD-10-CM | POA: Diagnosis not present

## 2020-05-25 DIAGNOSIS — I340 Nonrheumatic mitral (valve) insufficiency: Secondary | ICD-10-CM | POA: Diagnosis not present

## 2020-05-25 LAB — CBC WITH DIFFERENTIAL/PLATELET
Abs Immature Granulocytes: 0.01 10*3/uL (ref 0.00–0.07)
Basophils Absolute: 0 10*3/uL (ref 0.0–0.1)
Basophils Relative: 0 %
Eosinophils Absolute: 0.1 10*3/uL (ref 0.0–0.5)
Eosinophils Relative: 2 %
HCT: 38.6 % (ref 36.0–46.0)
Hemoglobin: 13.1 g/dL (ref 12.0–15.0)
Immature Granulocytes: 0 %
Lymphocytes Relative: 40 %
Lymphs Abs: 2.1 10*3/uL (ref 0.7–4.0)
MCH: 30.4 pg (ref 26.0–34.0)
MCHC: 33.9 g/dL (ref 30.0–36.0)
MCV: 89.6 fL (ref 80.0–100.0)
Monocytes Absolute: 0.3 10*3/uL (ref 0.1–1.0)
Monocytes Relative: 6 %
Neutro Abs: 2.7 10*3/uL (ref 1.7–7.7)
Neutrophils Relative %: 52 %
Platelets: 158 10*3/uL (ref 150–400)
RBC: 4.31 MIL/uL (ref 3.87–5.11)
RDW: 12 % (ref 11.5–15.5)
WBC: 5.2 10*3/uL (ref 4.0–10.5)
nRBC: 0 % (ref 0.0–0.2)

## 2020-05-25 LAB — COMPREHENSIVE METABOLIC PANEL
ALT: 14 U/L (ref 0–44)
AST: 14 U/L — ABNORMAL LOW (ref 15–41)
Albumin: 3.5 g/dL (ref 3.5–5.0)
Alkaline Phosphatase: 81 U/L (ref 38–126)
Anion gap: 10 (ref 5–15)
BUN: 15 mg/dL (ref 8–23)
CO2: 28 mmol/L (ref 22–32)
Calcium: 8.9 mg/dL (ref 8.9–10.3)
Chloride: 96 mmol/L — ABNORMAL LOW (ref 98–111)
Creatinine, Ser: 0.82 mg/dL (ref 0.44–1.00)
GFR, Estimated: >60 mL/min (ref >60)
Glucose, Bld: 153 mg/dL — ABNORMAL HIGH (ref 70–99)
Potassium: 3.7 mmol/L (ref 3.5–5.1)
Sodium: 134 mmol/L — ABNORMAL LOW (ref 135–145)
Total Bilirubin: 0.9 mg/dL (ref 0.3–1.2)
Total Protein: 6.3 g/dL — ABNORMAL LOW (ref 6.5–8.1)

## 2020-05-25 LAB — TROPONIN I (HIGH SENSITIVITY)
Troponin I (High Sensitivity): 7 ng/L (ref <18)
Troponin I (High Sensitivity): 7 ng/L (ref <18)

## 2020-05-25 LAB — URINALYSIS, COMPLETE (UACMP) WITH MICROSCOPIC
Bacteria, UA: NONE SEEN
Bilirubin Urine: NEGATIVE
Glucose, UA: NEGATIVE mg/dL
Hgb urine dipstick: NEGATIVE
Ketones, ur: NEGATIVE mg/dL
Leukocytes,Ua: NEGATIVE
Nitrite: NEGATIVE
Protein, ur: NEGATIVE mg/dL
Specific Gravity, Urine: 1.011 (ref 1.005–1.030)
pH: 6 (ref 5.0–8.0)

## 2020-05-25 LAB — BRAIN NATRIURETIC PEPTIDE: B Natriuretic Peptide: 243.5 pg/mL — ABNORMAL HIGH (ref 0.0–100.0)

## 2020-05-25 LAB — RESP PANEL BY RT-PCR (FLU A&B, COVID) ARPGX2
Influenza A by PCR: NEGATIVE
Influenza B by PCR: NEGATIVE
SARS Coronavirus 2 by RT PCR: NEGATIVE

## 2020-05-25 MED ORDER — ALUM & MAG HYDROXIDE-SIMETH 200-200-20 MG/5ML PO SUSP
30.0000 mL | Freq: Once | ORAL | Status: AC
  Administered 2020-05-25: 30 mL via ORAL
  Filled 2020-05-25: qty 30

## 2020-05-25 MED ORDER — FENTANYL CITRATE (PF) 100 MCG/2ML IJ SOLN
50.0000 ug | Freq: Once | INTRAMUSCULAR | Status: AC
  Administered 2020-05-25: 50 ug via INTRAVENOUS
  Filled 2020-05-25: qty 2

## 2020-05-25 MED ORDER — ACETAMINOPHEN 500 MG PO TABS
1000.0000 mg | ORAL_TABLET | Freq: Once | ORAL | Status: AC
  Administered 2020-05-25: 1000 mg via ORAL
  Filled 2020-05-25: qty 2

## 2020-05-25 MED ORDER — ONDANSETRON HCL 4 MG/2ML IJ SOLN
4.0000 mg | Freq: Once | INTRAMUSCULAR | Status: AC
  Administered 2020-05-25: 4 mg via INTRAVENOUS
  Filled 2020-05-25: qty 2

## 2020-05-25 NOTE — Discharge Instructions (Addendum)
Your tests today were all reassuring.  Please follow-up with Dr. Rockey Situ next week for further evaluation of the symptoms.

## 2020-05-25 NOTE — ED Triage Notes (Signed)
Per EMS report, patient c/o chest pain began at 0300 this morning. Patient described pain as sharp and radiates to left side of neck and left arm. Patient was given two Nitro SL at 0400 approximately and described chest pain as a tightness. In the ambulance, Patient was given Zofran for c/o nausea, two Nitro sprays and 324mg  ASA. Patient describes pain as 7/10, tightness, squeezing painthat radiates left arm. Patient has history of MI, CVA and a hernia in the chest that is touching both heart and lung per EMS report.

## 2020-05-25 NOTE — ED Provider Notes (Signed)
[facility] Emergency Department Provider Note  ____________________________________________   Event Date/Time   First MD Initiated Contact with Patient 05/25/20 1212     (approximate)  I have reviewed the triage vital signs and the nursing notes.   HISTORY  Chief Complaint Chest Pain    HPI Lydia King is a 87 y.o. female with heart failure preserved EF, asthma, coronary disease status post stent, prior stroke on Plavix who comes in for chest tightness.  Patient reports chest tightness that started last night around 3 AM.  She states it feels like a squeezing sensation that is moderate, constant, better with nitro, nothing makes it worse.  She states that the pain occasionally goes down into the left arm and feels so tight that she cannot move it.  She denies this ever happening previously.  Patient got 2 nitro around 5 AM and the patient symptoms got better but never went away.  She then reports getting nitro with EMS is also helped the pain but not going away.  Denies any shortness of breath, new leg swelling, recent falls.  Denies any chest pain with ambulation recently or any other concerns.  Family does report that about a week ago patient did have some nausea and diarrhea but that is now since resolved but it was a similar viral illness that the daughter had who is in the room.           Past Medical History:  Diagnosis Date  . (HFpEF) heart failure with preserved ejection fraction ([place])    a. 06/2019 Echo ([facility]): EF >55%, mild conc LVH. Mild-mod AS, mild MR/TR.  Marland Kitchen Anemia    in past (from blood loss at hiatal hernia)  . Asthma   . CAD (coronary artery disease)    a. 05/2019 MI/PCI: ost LCX (rota + cba-->3.0x16 Synergy DES), LAD 90 (3.0x16 Synergy DES).  . Charcot-Marie-Tooth disease    walks with cane (fairly controlled)  . Diverticulosis   . DVT (deep venous thrombosis) ([place]) 2003   after sx  . GERD (gastroesophageal reflux disease)   .  Grief reaction    on fluoxetine  . Headache(784.0)    migraine  . Hiatal hernia   . Hyperlipidemia   . Hypertension   . OA (ocular albinism) ([place])   . Old myocardial infarction   . Osteopenia   . PUD (peptic ulcer disease)    gastric, past  . Shingles   . Stroke ([place]) 8/10   posterior reversible encephalopathy syndrome  . Urine incontinence     Patient Active Problem List   Diagnosis Date Noted  . Pain due to onychomycosis of toenails of both feet 11/24/2019  . Anxiety 11/09/2019  . Chronic diastolic CHF (congestive heart failure) ([place]) 11/03/2019  . Paraesophageal hiatal hernia   . Chest pain 11/02/2019  . CAD (coronary artery disease) 08/01/2019  . Anemia 08/01/2019  . Acute on chronic congestive heart failure ([place]) 07/31/2019  . Acute decompensated heart failure ([place]) 07/03/2019  . Bradycardia 06/17/2019  . Hypoalbuminemia 06/17/2019  . Severe protein-calorie malnutrition ([place]) 06/17/2019  . Aortic valve stenosis 06/15/2019  . Mitral valve regurgitation 06/15/2019  . Positive cardiac stress test 06/13/2019  . Closed fracture of right distal femur ([place]) 03/15/2019  . Periprosthetic fracture around internal prosthetic right knee joint 03/15/2019  . Hyperlipidemia associated with type 2 diabetes mellitus ([place]) 07/27/2018  . Sinus congestion 02/05/2018  . Vertigo 05/06/2017  . OSA (obstructive sleep apnea) 01/02/2016  . Pedal  edema 01/02/2016  . Fall in home 12/11/2015  . Poor balance 12/11/2015  . Low back pain with sciatica 10/02/2015  . Right hip pain 10/02/2015  . Hearing loss 04/18/2015  . Encounter for Medicare annual wellness exam 10/04/2013  . Type 2 diabetes mellitus without complications ([place]) 42/70/6237  . History of CVA (cerebrovascular accident) 09/12/2008  . Osteoarthritis of multiple joints 05/30/2008  . Osteoporosis 05/30/2008  . Vitamin D deficiency 07/14/2007  . Sleep apnea 07/14/2007  . Essential hypertension 11/30/2006  . Asthma 11/30/2006  .  H/O gastroesophageal reflux (GERD) 11/30/2006  . G I BLEED 11/30/2006  . DEEP VENOUS THROMBOPHLEBITIS, HX OF 11/30/2006    Past Surgical History:  Procedure Laterality Date  . carotid dopplers  8/10  . COLONOSCOPY  2002  . HERNIA REPAIR  2002   bleeding  . HIATAL HERNIA REPAIR  2000   needed blood transfusion  . Stroke  8/10   hosp stroke/ posterior (vs posterior reversible encephalopathy syndrome) - d/c on Plavix  . TEE normal  6/28   no emoblic source  . TOTAL KNEE ARTHROPLASTY  03,06   Bilateral  . VESICOVAGINAL FISTULA CLOSURE W/ TAH  1964   with bleeding and cervical dysplasia    Prior to Admission medications   Medication Sig Start Date End Date Taking? Authorizing Provider  acetaminophen (TYLENOL) 500 MG tablet Take 2 tablets (1,000 mg total) by mouth in the morning, at noon, and at bedtime. 11/04/19   Fritzi Mandes, MD  alum & mag hydroxide-simeth (MAALOX/MYLANTA) 200-200-20 MG/5ML suspension Take 30 mLs by mouth in the morning, at noon, and at bedtime.    [provider]  aspirin EC 81 MG EC tablet Take 1 tablet (81 mg total) by mouth daily. Swallow whole. 08/05/19   Bonnielee Haff, MD  azelastine (ASTELIN) 0.1 % nasal spray Place 1 spray into both nostrils 2 (two) times daily. Use in each nostril as directed 08/26/17   Cuthriell, Charline Bills, PA-C  Blood Glucose Monitoring Suppl (FREESTYLE LITE) DEVI Use to check blood glucose daily and as needed for diabetes 10/11/17   Tower, Wynelle Fanny, MD  Calcium Carbonate-Vitamin D 600-400 MG-UNIT tablet Take 1 tablet by mouth daily with breakfast.     [provider]  carvedilol (COREG) 6.25 MG tablet Take 1 tablet (6.25 mg total) by mouth 2 (two) times daily with a meal. 11/04/19   Fritzi Mandes, MD  Cholecalciferol (VITAMIN D) 2000 UNITS CAPS Take 2 capsules by mouth daily.      [provider]  cloNIDine (CATAPRES) 0.1 MG tablet Take 0.1 mg by mouth 3 (three) times daily as needed.    [provider]   clopidogrel (PLAVIX) 75 MG tablet TAKE 1 TABLET BY MOUTH EVERY DAY Patient taking differently: Take 75 mg by mouth daily.  07/26/19   Tower, Wynelle Fanny, MD  cyanocobalamin 1000 MCG tablet Take 1,000 mcg by mouth daily.    [provider]  esomeprazole (NEXIUM) 40 MG capsule Take 1 capsule (40 mg total) by mouth daily before breakfast. 07/27/18   Tower, Wynelle Fanny, MD  famotidine (PEPCID) 20 MG tablet Take 20 mg by mouth at bedtime.    [provider]  Flaxseed, Linseed, (FLAX SEED OIL) 1000 MG CAPS Take 1 capsule by mouth daily.      [provider]  FLUoxetine (PROZAC) 20 MG capsule Take 20 mg by mouth daily. 11/20/19   [provider]  FLUoxetine (PROZAC) 40 MG capsule Take 1 capsule (40 mg  total) by mouth daily. 11/09/19   Tower, Wynelle Fanny, MD  furosemide (LASIX) 40 MG tablet Take 1 tablet (40 mg total) by mouth daily. 11/09/19   Tower, Marne A, MD  glucose blood (FREESTYLE LITE) test strip Use to check blood sugar once daily for DM (dx. [id number]) 11/08/18   Tower, Wynelle Fanny, MD  isosorbide mononitrate (IMDUR) 30 MG 24 hr tablet Take 1 tablet (30 mg total) by mouth 2 (two) times daily. 11/04/19 02/02/20  Fritzi Mandes, MD  Lancets (FREESTYLE) lancets Use as instructedUse to check blood sugar once daily for DM (dx. E11.618) 11/08/18   Tower, Wynelle Fanny, MD  lidocaine (LMX) 4 % cream Apply 1 application topically in the morning and at bedtime. To low back and shoulder areas 11/11/19   Tower, Goehner A, MD  lisinopril (ZESTRIL) 20 MG tablet Take 1 tablet (20 mg total) by mouth daily. 11/09/19   Tower, Wynelle Fanny, MD  loperamide (IMODIUM) 2 MG capsule Take 2 mg by mouth as needed for diarrhea or loose stools.    [provider]  loratadine (CLARITIN) 10 MG tablet TAKE 1 TABLET BY MOUTH EVERY DAY Patient taking differently: Take 10 mg by mouth daily.  02/28/19   Tower, Wynelle Fanny, MD  melatonin 3 MG TABS tablet Take 3 mg by mouth at bedtime.    [provider]  metFORMIN  (GLUCOPHAGE XR) 500 MG 24 hr tablet Take 1 tablet (500 mg total) by mouth daily with breakfast. 11/04/19 11/03/20  Fritzi Mandes, MD  Multiple Vitamins-Minerals (PRESERVISION AREDS 2) CAPS Give one pill by mouth bid 11/09/19   Tower, Wynelle Fanny, MD  nitroGLYCERIN (NITROSTAT) 0.4 MG SL tablet Place under the tongue. 06/19/19 06/18/20  [provider]  omega-3 acid ethyl esters (LOVAZA) 1 g capsule TAKE 1 CAPSULE (1 G TOTAL) BY MOUTH DAILY. Patient taking differently: Take 1 g by mouth 2 (two) times daily.  07/26/19   Tower, Wynelle Fanny, MD  ondansetron (ZOFRAN) 4 MG tablet Take 4 mg by mouth every 4 (four) hours as needed for nausea or vomiting.    [provider]  polyethylene glycol (MIRALAX / GLYCOLAX) 17 g packet Take 17 g by mouth daily. 11/11/19   Tower, Wynelle Fanny, MD  PROAIR HFA 108 [phone] Base) MCG/ACT inhaler INHALE 2 PUFFS INTO THE LUNGS EVERY 4 (FOUR) HOURS AS NEEDED FOR WHEEZING OR SHORTNESS OF BREATH. 03/05/16   Tower, Wynelle Fanny, MD  rosuvastatin (CRESTOR) 20 MG tablet TAKE 1 TABLET BY MOUTH EVERYDAY AT BEDTIME Patient taking differently: Take 20 mg by mouth at bedtime. TAKE 1 TABLET BY MOUTH EVERYDAY AT BEDTIME 11/04/19   Fritzi Mandes, MD  sennosides-docusate sodium (SENOKOT-S) 8.6-50 MG tablet Take 2 tablets by mouth daily.    [provider]  trolamine salicylate (ASPERCREME/ALOE) 10 % cream Apply 1 application topically daily. To shoulder areas 11/11/19   Tower, Wynelle Fanny, MD    Allergies Morphine and related, Other, Shrimp [shellfish allergy], Amlodipine besylate, Atorvastatin, Ciprofloxacin, Influenza vaccines, Iodinated diagnostic agents, Nitrofurantoin, Omeprazole, Pantoprazole sodium, Penicillin g, Penicillins, Simvastatin, Sulfonamide derivatives, Tramadol, Codeine, and Prednisone  Family History  Problem Relation Age of Onset  . Cerebral aneurysm Son   . Cerebral aneurysm Father   . Hyperlipidemia Father   . Alcohol abuse Father   . Stroke Mother   . Cervical cancer  Sister   . Hyperlipidemia Brother   . Alcohol abuse Brother   . Stroke Brother   . Diabetes Brother   . Diabetes Sister   .  Sarcoidosis Sister   . Other Sister        OP/ Broken hip  . Other Other        whole family has charcot marie tooth  . Ovarian cancer Sister     Social History Social History   Tobacco Use  . Smoking status: Never Smoker  . Smokeless tobacco: Never Used  Vaping Use  . Vaping Use: Never used  Substance Use Topics  . Alcohol use: No  . Drug use: No      Review of Systems Constitutional: No fever/chills Eyes: No visual changes. ENT: No sore throat. Cardiovascular: Positive chest pain Respiratory: Denies shortness of breath. Gastrointestinal: No abdominal pain.  No nausea, no vomiting.  No diarrhea.  No constipation. Genitourinary: Negative for dysuria. Musculoskeletal: Negative for back pain. Skin: Negative for rash. Neurological: Negative for headaches, focal weakness or numbness. All other ROS negative ____________________________________________   PHYSICAL EXAM:  VITAL SIGNS: ED Triage Vitals [05/25/20 1213]  Enc Vitals Group     BP      Pulse      Resp      Temp      Temp src      SpO2 94 %     Weight      Height      Head Circumference      Peak Flow      Pain Score      Pain Loc      Pain Edu?      Excl. in [place]?     Constitutional: Alert and oriented. Well appearing and in no acute distress. Eyes: Conjunctivae are normal. EOMI. Head: Atraumatic. Nose: No congestion/rhinnorhea. Mouth/Throat: Mucous membranes are moist.   Neck: No stridor. Trachea Midline. FROM Cardiovascular: Normal rate, regular rhythm. Grossly normal heart sounds.  Good peripheral circulation. Respiratory: Normal respiratory effort.  No retractions. Lungs CTAB. Gastrointestinal: Soft and nontender. No distention. No abdominal bruits.  Musculoskeletal: No lower extremity tenderness nor edema.  No joint effusions. Neurologic:  Normal speech and  language. No gross focal neurologic deficits are appreciated.  Skin:  Skin is warm, dry and intact. No rash noted. Psychiatric: Mood and affect are normal. Speech and behavior are normal. GU: Deferred   ____________________________________________   LABS (all labs ordered are listed, but only abnormal results are displayed)  Labs Reviewed  COMPREHENSIVE METABOLIC PANEL - Abnormal; Notable for the following components:      Result Value   Sodium 134 (*)    Chloride 96 (*)    Glucose, Bld 153 (*)    Total Protein 6.3 (*)    AST 14 (*)    All other components within normal limits  BRAIN NATRIURETIC PEPTIDE - Abnormal; Notable for the following components:   B Natriuretic Peptide 243.5 (*)    All other components within normal limits  RESP PANEL BY RT-PCR (FLU A&B, COVID) ARPGX2  CBC WITH DIFFERENTIAL/PLATELET  URINALYSIS, COMPLETE (UACMP) WITH MICROSCOPIC  TROPONIN I (HIGH SENSITIVITY)  TROPONIN I (HIGH SENSITIVITY)   ____________________________________________   ED ECG REPORT I, Vanessa Irwin, the attending physician, personally viewed and interpreted this ECG.  Normal sinus rate of 66, no ST elevation, no T wave inversions, normal intervals ____________________________________________  RADIOLOGY Robert Bellow, personally viewed and evaluated these images (plain radiographs) as part of my medical decision making, as well as reviewing the written report by the radiologist.  ED MD interpretation: No pneumonia  Official radiology report(s): DG Chest 2 View  Result Date: 05/25/2020 CLINICAL DATA:  Chest pain EXAM: CHEST - 2 VIEW COMPARISON:  November 02, 2019 chest radiograph and chest CT FINDINGS: There is right lower lobe atelectasis. Lungs elsewhere clear. Heart is upper normal in size with pulmonary vascularity normal. No adenopathy. There is a large hiatal type hernia. There is extensive arthropathy in each shoulder. There is aortic atherosclerosis. There is calcification  in portions of the left anterior descending and right coronary arteries. IMPRESSION: Large hiatal hernia. Right lower lobe atelectasis. Lungs otherwise clear. Stable cardiac silhouette. Extensive arthropathy in each shoulder with suspected chronic rotator cuff tears. Aortic Atherosclerosis (ICD10-I70.0). Foci of coronary artery calcification noted. Electronically Signed   By: Lowella Grip III M.D.   On: 05/25/2020 14:21    ____________________________________________   PROCEDURES  Procedure(s) performed (including Critical Care):  .1-3 Lead EKG Interpretation Performed by: Vanessa [place], MD Authorized by: Vanessa Darien, MD     Interpretation: normal     ECG rate:  60s    ECG rate assessment: normal     Rhythm: sinus rhythm     Ectopy: none     Conduction: normal       ____________________________________________   INITIAL IMPRESSION / ASSESSMENT AND PLAN / ED COURSE   Lydia King was evaluated in Emergency Department on 05/25/2020 for the symptoms described in the history of present illness. She was evaluated in the context of the global COVID-19 pandemic, which necessitated consideration that the patient might be at risk for infection with the SARS-CoV-2 virus that causes COVID-19. Institutional protocols and algorithms that pertain to the evaluation of patients at risk for COVID-19 are in a state of rapid change based on information released by regulatory bodies including the CDC and federal and state organizations. These policies and algorithms were followed during the patient's care in the ED.    Most Likely DDx:  -MSK (atypical chest pain) related to her hiatal hernia versus ACS  DDx that was also considered d/t potential to cause harm, but was found less likely based on history and physical (as detailed above): -PNA (no fevers, cough but CXR to evaluate) -PNX (reassured with equal b/l breath sounds, CXR to evaluate) -Symptomatic anemia (will get H&H) -Pulmonary  embolism as no sob at rest, not pleuritic in nature, no hypoxia -Aortic Dissection as no tearing pain and no radiation to the mid back, pulses equal -Pericarditis no rub on exam, EKG changes or hx to suggest dx -Tamponade (no notable SOB, tachycardic, hypotensive) -Esophageal rupture (no h/o diffuse vomitting/no crepitus)  Patient got some fentanyl but the little nauseous afterwards but patient already been given some Zofran.  Patient's initial cardiac markers negative.  Given patient's high risk I did discuss with Dr. Rockey Situ from cardiology who recommended repeat troponin and GI cocktail.  If symptoms are resolved and cardiac markers are negative patient can follow-up outpatient clinic with them.  Patient handed off coming team pending troponin and reevaluation.  I discussed this with the daughter and the patient and they felt comfortable with going home if felt appropriate   ____________________________________________   FINAL CLINICAL IMPRESSION(S) / ED DIAGNOSES   Final diagnoses:  Chest pain, unspecified type     MEDICATIONS GIVEN DURING THIS VISIT:  Medications  fentaNYL (SUBLIMAZE) injection 50 mcg (50 mcg Intravenous Given 05/25/20 1335)  ondansetron (ZOFRAN) injection 4 mg (4 mg Intravenous Given 05/25/20 1333)  alum & mag hydroxide-simeth (MAALOX/MYLANTA) 200-200-20 MG/5ML suspension 30 mL (30 mLs Oral Given 05/25/20 1507)  acetaminophen (  TYLENOL) tablet 1,000 mg (1,000 mg Oral Given 05/25/20 1506)     ED Discharge Orders    None       Note:  This document was prepared using Dragon voice recognition software and may include unintentional dictation errors.   Vanessa Witt, MD 05/25/20 1525

## 2020-05-25 NOTE — ED Provider Notes (Signed)
Procedures     ----------------------------------------- 6:57 PM on 05/25/2020 ----------------------------------------- Serial troponins are normal.  Vital signs remained stable.  No recurrent pain.  Feeling better and stable for discharge to follow-up with Dr. Rockey Situ.    Carrie Mew, MD 05/25/20 [phone]

## 2020-05-25 NOTE — ED Notes (Signed)
Dr. Funke at bedside. 

## 2020-05-25 NOTE — ED Notes (Signed)
Family at bedside. Patient taken to imaging.

## 2020-05-25 NOTE — ED Notes (Signed)
Patient states she feels better, chest tightness remains, but little pain noted. Daughter at bedside. Patient appears more comfortable and at ease.

## 2020-05-25 NOTE — ED Notes (Signed)
Pt provided with ginger ale 

## 2020-05-28 DIAGNOSIS — G629 Polyneuropathy, unspecified: Secondary | ICD-10-CM | POA: Diagnosis not present

## 2020-05-28 DIAGNOSIS — K219 Gastro-esophageal reflux disease without esophagitis: Secondary | ICD-10-CM | POA: Diagnosis not present

## 2020-06-11 ENCOUNTER — Ambulatory Visit (INDEPENDENT_AMBULATORY_CARE_PROVIDER_SITE_OTHER): Payer: Medicare Other | Admitting: Medical

## 2020-06-11 ENCOUNTER — Telehealth: Payer: Self-pay | Admitting: Medical

## 2020-06-11 ENCOUNTER — Other Ambulatory Visit: Payer: Self-pay

## 2020-06-11 ENCOUNTER — Encounter: Payer: Self-pay | Admitting: Medical

## 2020-06-11 VITALS — BP 102/68 | HR 83 | Ht 59.0 in | Wt 141.0 lb

## 2020-06-11 DIAGNOSIS — I25118 Atherosclerotic heart disease of native coronary artery with other forms of angina pectoris: Secondary | ICD-10-CM

## 2020-06-11 DIAGNOSIS — E785 Hyperlipidemia, unspecified: Secondary | ICD-10-CM | POA: Diagnosis not present

## 2020-06-11 DIAGNOSIS — K449 Diaphragmatic hernia without obstruction or gangrene: Secondary | ICD-10-CM | POA: Diagnosis not present

## 2020-06-11 DIAGNOSIS — I5032 Chronic diastolic (congestive) heart failure: Secondary | ICD-10-CM | POA: Diagnosis not present

## 2020-06-11 DIAGNOSIS — R0789 Other chest pain: Secondary | ICD-10-CM

## 2020-06-11 DIAGNOSIS — E1169 Type 2 diabetes mellitus with other specified complication: Secondary | ICD-10-CM

## 2020-06-11 DIAGNOSIS — I10 Essential (primary) hypertension: Secondary | ICD-10-CM

## 2020-06-11 NOTE — Patient Instructions (Signed)
Medication Instructions:  No changes at this time.  *If you need a refill on your cardiac medications before your next appointment, please call your pharmacy*   Lab Work: None   If you have labs (blood work) drawn today and your tests are completely normal, you will receive your results only by: Marland Kitchen MyChart Message (if you have MyChart) OR . A paper copy in the mail If you have any lab test that is abnormal or we need to change your treatment, we will call you to review the results.   Testing/Procedures: None   Follow-Up: At [facility], you and your health needs are our priority.  As part of our continuing mission to provide you with exceptional heart care, we have created designated Provider Care Teams.  These Care Teams include your primary Cardiologist (physician) and Advanced Practice Providers (APPs -  Physician Assistants and Nurse Practitioners) who all work together to provide you with the care you need, when you need it.   Your next appointment:   3 month(s)  The format for your next appointment:   In Person  Provider:   You may see Ida Rogue, MD or one of the following Advanced Practice Providers on your designated Care Team:    Murray Hodgkins, NP  Christell Faith, PA-C  Marrianne Mood, PA-C  Cadence [place], [place]  Laurann [place], NP

## 2020-06-11 NOTE — Telephone Encounter (Signed)
Left voicemail message per release form that second opinion could be done at [facility] or Cardiothoracic surgery in [place] with instructions to call back if any further questions.

## 2020-06-11 NOTE — Progress Notes (Signed)
Cardiology Office Note:    Date:  06/11/2020   ID:  Lydia King, DOB [birth year], MRN [id number]  PCP:  Abner Greenspan, MD  [facility] Cardiologist:  Ida Rogue, MD  [place] Electrophysiologist:  None   Referring MD: Abner Greenspan, MD   Chief Complaint: f/u ED for chest pain  History of Present Illness:    Lydia King is a 87 y.o. female with a hx of CAD s/p PCI mLAD and Cx May 2021, mod AS, HFpEF, HTN, HLD, DM2, Charcot-Marie-Tooth, stroke, and hiatal hernia who presents for follow-up.   Patient had MI in May 2021 and was treated at [facility] with PCD DES placement to the LAD and Cx. Echo showed EF 55% with mild MR and mod stenosis. Since MI she has chest pain. CT chest showed large hiatal hernia and saw bariatrics at [facility] and was considered high risk due to comorbidities but that lap gastropexy and Gtube could be considered one she recovered from recent knee surgery.   She was last seen during an admission October 2021 for atypical chest pain. Cardiac enzymes were negative. Pain possible from GERD from large paraesophageal hernia and recommended possible surgery. No further testing was ordered.   More recently the patient was seen in the ER for chest pain. Case discussed with cardiology and recommended serial troponins and GI cocktail. Troponins were negative. EKG without ischemic changes.   Today, the patient presents for ER follow-up. Chest pain that brought her into the ER starts in the center of the chest and radiates to the right side and right shoulder. This is the same pain she has had for month. She feels most of the pain (90%) is from the hernia. Pain is worse after she eats, and worse with certain foods. Not worse with exertion. No SOB. Also sitting up worsens the pain. She has tried [employer] with no relief. No plans for surgery at this time. Said MD will not do surgery due to comorbidities, age, and recent stenting. Now that she is a year out of stenting, can  possibly re-visit surgery. Feels pain is getting worse. She takes tylenol and tramodol. Also Maylox helps the pain. Breathing is normal. No pnd, orthopnea, pnd. No recent fever, chills, illness. She has diarrhea at times, has a sensitive stomach. She lives in [place] [facility].   Past Medical History:  Diagnosis Date  . (HFpEF) heart failure with preserved ejection fraction ([place])    a. 06/2019 Echo ([facility]): EF >55%, mild conc LVH. Mild-mod AS, mild MR/TR.  Marland Kitchen Anemia    in past (from blood loss at hiatal hernia)  . Asthma   . CAD (coronary artery disease)    a. 05/2019 MI/PCI: ost LCX (rota + cba-->3.0x16 Synergy DES), LAD 90 (3.0x16 Synergy DES).  . Charcot-Marie-Tooth disease    walks with cane (fairly controlled)  . Diverticulosis   . DVT (deep venous thrombosis) ([place]) 2003   after sx  . GERD (gastroesophageal reflux disease)   . Grief reaction    on fluoxetine  . Headache(784.0)    migraine  . Hiatal hernia   . Hyperlipidemia   . Hypertension   . OA (ocular albinism) ([place])   . Old myocardial infarction   . Osteopenia   . PUD (peptic ulcer disease)    gastric, past  . Shingles   . Stroke ([place]) 8/10   posterior reversible encephalopathy syndrome  . Urine incontinence     Past Surgical History:  Procedure  Laterality Date  . carotid dopplers  8/10  . COLONOSCOPY  2002  . HERNIA REPAIR  2002   bleeding  . HIATAL HERNIA REPAIR  2000   needed blood transfusion  . Stroke  8/10   hosp stroke/ posterior (vs posterior reversible encephalopathy syndrome) - d/c on Plavix  . TEE normal  6/62   no emoblic source  . TOTAL KNEE ARTHROPLASTY  03,06   Bilateral  . VESICOVAGINAL FISTULA CLOSURE W/ TAH  1964   with bleeding and cervical dysplasia    Current Medications: Current Meds  Medication Sig  . acetaminophen (TYLENOL) 500 MG tablet Take 2 tablets (1,000 mg total) by mouth in the morning, at noon, and at bedtime.  Marland Kitchen alum & mag hydroxide-simeth (MAALOX/MYLANTA)  200-200-20 MG/5ML suspension Take 30 mLs by mouth in the morning, at noon, and at bedtime.  Marland Kitchen aspirin EC 81 MG EC tablet Take 1 tablet (81 mg total) by mouth daily. Swallow whole.  . Blood Glucose Monitoring Suppl (FREESTYLE LITE) DEVI Use to check blood glucose daily and as needed for diabetes  . Calcium Carbonate-Vitamin D 600-400 MG-UNIT tablet Take 1 tablet by mouth daily with breakfast.   . carvedilol (COREG) 6.25 MG tablet Take 1 tablet (6.25 mg total) by mouth 2 (two) times daily with a meal.  . Cholecalciferol (VITAMIN D) 2000 UNITS CAPS Take 2 capsules by mouth daily.  . cloNIDine (CATAPRES) 0.1 MG tablet Take 0.1 mg by mouth 3 (three) times daily as needed.  . clopidogrel (PLAVIX) 75 MG tablet TAKE 1 TABLET BY MOUTH EVERY DAY  . cyanocobalamin 1000 MCG tablet Take 1,000 mcg by mouth daily.  . diclofenac Sodium (VOLTAREN) 1 % GEL Apply 2 g topically 2 (two) times daily.  Marland Kitchen EPINEPHrine 0.3 mg/0.3 mL IJ SOAJ injection Inject 0.3 mg into the muscle as needed for anaphylaxis.  Marland Kitchen esomeprazole (NEXIUM) 40 MG capsule Take 1 capsule (40 mg total) by mouth daily before breakfast.  . Flaxseed, Linseed, (FLAX SEED OIL) 1000 MG CAPS Take 1 capsule by mouth daily.  Marland Kitchen FLUoxetine (PROZAC) 20 MG capsule Take 20 mg by mouth daily.  . furosemide (LASIX) 40 MG tablet Take 1 tablet (40 mg total) by mouth daily.  Marland Kitchen glucose blood (FREESTYLE LITE) test strip Use to check blood sugar once daily for DM (dx. E11.618)  . isosorbide mononitrate (IMDUR) 30 MG 24 hr tablet Take 1 tablet (30 mg total) by mouth 2 (two) times daily.  . Lancets (FREESTYLE) lancets Use as instructedUse to check blood sugar once daily for DM (dx. E11.618)  . lidocaine (LMX) 4 % cream Apply 1 application topically in the morning and at bedtime. To low back and shoulder areas  . lisinopril (ZESTRIL) 20 MG tablet Take 40 mg by mouth daily. Take 2 tablets daily  . loperamide (IMODIUM) 2 MG capsule Take 2 mg by mouth as needed for diarrhea or  loose stools.  Marland Kitchen loratadine (CLARITIN) 10 MG tablet TAKE 1 TABLET BY MOUTH EVERY DAY  . melatonin 3 MG TABS tablet Take 3 mg by mouth at bedtime.  . Menthol-Methyl Salicylate (SALONPAS PAIN RELIEF PATCH EX) Apply 1 patch topically daily.  . metFORMIN (GLUCOPHAGE XR) 500 MG 24 hr tablet Take 1 tablet (500 mg total) by mouth daily with breakfast.  . Multiple Vitamins-Minerals (PRESERVISION AREDS 2) CAPS Give one pill by mouth bid  . nitroGLYCERIN (NITROSTAT) 0.4 MG SL tablet Place under the tongue.  Marland Kitchen omega-3 acid ethyl esters (LOVAZA) 1 g capsule TAKE  1 CAPSULE (1 G TOTAL) BY MOUTH DAILY.  Marland Kitchen ondansetron (ZOFRAN) 4 MG tablet Take 4 mg by mouth every 4 (four) hours as needed for nausea or vomiting.  . polyethylene glycol (MIRALAX / GLYCOLAX) 17 g packet Take 17 g by mouth daily.  Marland Kitchen PROAIR HFA 108 (90 Base) MCG/ACT inhaler INHALE 2 PUFFS INTO THE LUNGS EVERY 4 (FOUR) HOURS AS NEEDED FOR WHEEZING OR SHORTNESS OF BREATH.  . rosuvastatin (CRESTOR) 20 MG tablet TAKE 1 TABLET BY MOUTH EVERYDAY AT BEDTIME  . sennosides-docusate sodium (SENOKOT-S) 8.6-50 MG tablet Take 1 tablet by mouth daily.  . traMADol (ULTRAM) 50 MG tablet Take 50 mg by mouth 4 (four) times daily as needed.  . trolamine salicylate (ASPERCREME/ALOE) 10 % cream Apply 1 application topically daily. To shoulder areas     Allergies:   Morphine and related, Other, Shrimp [shellfish allergy], Amlodipine besylate, Atorvastatin, Ciprofloxacin, Influenza vaccines, Iodinated diagnostic agents, Nitrofurantoin, Omeprazole, Pantoprazole sodium, Penicillin g, Penicillins, Simvastatin, Sulfonamide derivatives, Tramadol, Codeine, and Prednisone   Social History   Socioeconomic History  . Marital status: Widowed    Spouse name: Not on file  . Number of children: Not on file  . Years of education: Not on file  . Highest education level: Not on file  Occupational History  . Not on file  Tobacco Use  . Smoking status: Never Smoker  . Smokeless  tobacco: Never Used  Vaping Use  . Vaping Use: Never used  Substance and Sexual Activity  . Alcohol use: No  . Drug use: No  . Sexual activity: Not Currently  Other Topics Concern  . Not on file  Social History Narrative   G6P4   Son died 2 years ago cerebral anerysm   Social Determinants of Health   Financial Resource Strain: Not on file  Food Insecurity: Not on file  Transportation Needs: Not on file  Physical Activity: Not on file  Stress: Not on file  Social Connections: Not on file     Family History: The patient's *family history includes Alcohol abuse in her brother and father; Cerebral aneurysm in her father and son; Cervical cancer in her sister; Diabetes in her brother and sister; Hyperlipidemia in her brother and father; Other in her sister and another family member; Ovarian cancer in her sister; Sarcoidosis in her sister; Stroke in her brother and mother.  ROS:   Please see the history of present illness.     All other systems reviewed and are negative.  EKGs/Labs/Other Studies Reviewed:    The following studies were reviewed today: Cardiac MRI 06/13/19: 1. The left ventricle is normal in cavity size. There is mild LV hypertrophy. Global systolic function is hyperdynamic. The calculated LV ejection fraction is 71%. The basal inferior wall is hypokinetic.   2. The right ventricle is normal in cavity size, wall thickness, and systolic function.  3. The left atrium is moderately to severely enlarged. The right atrium is moderately enlarged.  4. The aortic valve is tricuspid in morphology with moderate calcific-degenerative changes. There is morphological evidence of significant aortic valve stenosis. However, we were unable to continue with the aortic valve flow assessment portion of the exam to assess the severity of stenosis. The patient felt very uncomfortable inside the scanner and requested the exam to be interrupted immediately. There is no aortic  regurgitation.  5. The mitral valve leaflets are mildly thickened. There is mild mitral regurgitation. There is moderate tricuspid regurgitation. There is mild pulmonic regurgitation.  6. Delayed  enhancement imaging demonstrates no evidence of myocardial infarction, scarring or infiltrative disease.   7. Adenosine stress perfusion imaging is abnormal. There are perfusion defects involving the basal inferoseptum and mid to apical inferior walls, consistent with inducible ischemia in the distribution of a posterior descending coronary artery branch of the RCA or the territory of the LCX coronary artery depending upon coronary artery dominance.  8. No intracardiac thrombus are seen.  9. The pericardium is normal in thickness. There is a small pericardial effusion.  10. Small bilateral pleural effusions are seen.  Echo 5/19 LEFT VENTRICLE Anterior: Normal Size: SMALL Lateral: Normal Contraction: Normal Septal: Normal Closest EF: >55% (Estimated) Apical: Normal LV masses: No Masses Inferior: Normal LVH: MILD LVH CONCENTRIC Posterior: Normal LV GLS(LOL): -13.5% Dias.FxClass: RELAXATION ABNORMALITY (GRADE 1) CORRESPONDS TO E/A REVERSAL INTERPRETATION --------------------------------------------------------------- NORMAL LEFT VENTRICULAR SYSTOLIC FUNCTION WITH MILD LVH NORMAL LA PRESSURES WITH NORMAL DIASTOLIC FUNCTION NORMAL RIGHT VENTRICULAR SYSTOLIC FUNCTION VALVULAR REGURGITATION: MILD MR, TRIVIAL PR, MILD TR VALVULAR STENOSIS: MODERATE AS DEGENERATIVE HEART VALVE DISEASE: THE AORTIC VALVE IS THICKENED AND CALCIFIC. THERE IS MODERATE AORTIC VALVE STENOSIS (MEAN PG: 19 MMHG AND THE DI: 0.35) . THERE IS NO AORTIC VALVE REGURGITATION. THERE IS MILD TO MODERATE TRICUSPID VALVE REGURGITATION.  LHC 06/15/19: Impressions:  s/p cath -- severe calcific 3 vessel CAD --   Recommendations:  would stop and discuss high risk PCI -- will most likely need rotational atherectomy for LAD +/- L  Cx   LHC 06/15/19: Impressions:  1. Successful PCI of the ostial circumflex with a 1.5 rotational atherectomy followed by a 3.0 x 15 mm cutting balloon and deployment of a 55m x 161mSynergy DES with an excellent angiographic result. IVUS prior to stent deployment showed exact luminal diameter and no involvement of the left main.  2. Successful PCI of the LAD with initial IVUS showing a 90% stenosis with minimal calcification. A 74m45m 45m38mnergy DES was deployed at 14 ATM's  Recommendations:  1. Remove IABP in the cath lab and suture sheath to the skin.  2. Remove sheaths by manual compression once the ACT is less than 200 sec. 3. DAPT for the next 3 to 12 months if possible.  4. Return to floor in stable condition.    EKG:  EKG is  ordered today.  The ekg ordered today demonstrates NSR, 83bpm, no ischemic changes  Recent Labs: 10/31/2019: TSH 1.45 05/25/2020: ALT 14; B Natriuretic Peptide 243.5; BUN 15; Creatinine, Ser 0.82; Hemoglobin 13.1; Platelets 158; Potassium 3.7; Sodium 134  Recent Lipid Panel    Component Value Date/Time   CHOL 179 10/31/2019 1637   TRIG 180.0 (H) 10/31/2019 1637   HDL 57.60 10/31/2019 1637   CHOLHDL 3 10/31/2019 1637   VLDL 36.0 10/31/2019 1637   LDLCALC 85 10/31/2019 1637   LDLDIRECT 78.0 07/14/2016 1506     Physical Exam:    VS:  BP 102/68 (BP Location: Right Arm, Patient Position: Sitting, Cuff Size: Normal)   Pulse 83   Ht '4\' 11"'  (1.499 m)   Wt 141 lb (64 kg)   SpO2 92%   BMI 28.48 kg/m     Wt Readings from Last 3 Encounters:  06/11/20 141 lb (64 kg)  05/25/20 133 lb 9.6 oz (60.6 kg)  05/11/20 144 lb (65.3 kg)     GEN:  Well nourished, well developed in no acute distress HEENT: Normal NECK: No JVD; No carotid bruits LYMPHATICS: No lymphadenopathy CARDIAC: RRR, no murmurs, rubs, gallops RESPIRATORY:  Clear  to auscultation without rales, wheezing or rhonchi  ABDOMEN: Soft, non-tender, non-distended MUSCULOSKELETAL:  No edema; No  deformity  SKIN: Warm and dry NEUROLOGIC:  Alert and oriented x 3 PSYCHIATRIC:  Normal affect   ASSESSMENT:    1. Atypical chest pain   2. Coronary artery disease of native artery of native heart with stable angina pectoris ([place])   3. Hyperlipidemia associated with type 2 diabetes mellitus ([place])   4. Essential hypertension   5. Chronic diastolic heart failure (HCC)   6. Paraesophageal hernia    PLAN:    In order of problems listed above:  Atypical chest pain CAD s/p PCI mLAD and  Cx May 2021 Recently went to ER for chest pain, found to have negative serial troponins and EKG without ischemic changes- ACS rule out. On my interview this is the same chest pain she has had for many months, mostly due to large hernia. It is worse with certain foods, after meals, with positioning. Not worse with exertion. SL Nitro has not helped. Breathing is normal. Discussed the option of Myoview stress test, however since patient feels pain is non-cardiac, she would like to defer stress test at this time. If pain were to change or worsen, call or come in, can consider Myoview stress test. Cannot titrate meds with borderline pressures. Continue Imdur, coreg, Aspirin, Plavix.    HTN BP soft. Continue current regimen  HLD LDL 85 10/2019. Continue statin. Can re-check at follow-up.   Large paraesophageal Hernia Initial work-up at [facility]. At that time felt to be high risk for surgery due to age, comorbidities, and recent cardiac stenting. She is now a year out from stenting and hernia pain continues to worsen. She takes tylenol, tramadol, Maylox, and Nexium for pain. Can take Tums as needed. Discomfort form hernia continues to worsen, especially over the last 6 months. She has almost constant chest discomfort from hernia. It is greatly affecting her quality of life. Recommend re-visit hernia surgery and/or second opinion.  HFpEF Euvolemic on exam. Continue lasix daily, BB, ACE.  Pre-op Cardiac evaluation for  hernia Spoke to Dr. Rockey Situ and he agrees to seek second opinion regarding hernia surgery. She is a year out from cardiac stenting and has persistent chest discomfort from large hernia. According to Revised cardiac Risk Index she is at moderate risk for surgery, 15% 30 day risk of death, MI, or cardiac arrest. At baseline patient is relatively functional, METS>4. Patient would be at an acceptable risk for hernia surgery. No further cardiac work-up at this time. Can hold plavix 5 days before surgery. Continue Aspirin during perioperative period. EKG without acute changes.   Disposition: Follow up in 3 month(s) with Dr. Rockey Situ   Signed, [place], PA-C  06/11/2020 4:42 PM    [place]

## 2020-06-13 ENCOUNTER — Telehealth: Payer: Self-pay | Admitting: Cardiovascular Disease

## 2020-06-13 ENCOUNTER — Encounter: Payer: Self-pay | Admitting: Medical

## 2020-06-13 ENCOUNTER — Telehealth: Payer: Self-pay

## 2020-06-13 NOTE — Telephone Encounter (Signed)
I can order xanax for emergencies but this will increase fall risk for the duration of the dose. It if is given will someone stay with her to prevent fall?  I assume she would need an order and have it sent to a pharmacy (which one?) Is she still taking the generic prozac?  How often does she dose the tramadol  Thanks for the heads up

## 2020-06-13 NOTE — Telephone Encounter (Signed)
Was able to return phone call to pt's daughter [name] [facility] approved). Mrs. Greenhalgh received news that her sister fell and injured her hip and now is in a coma, she is not taking the news very well and is having  "panic attacks" her [name]. [name] is requesting some sort of medication to be called in soon as possible to help Mrs. Creasman. Advised to reach out to PCP for medication script such as Xanax or Klonopin, could possible try ativan 0.5 mg dose if provider feels necessary.   [name] is thankful for the return call, she just got confirmation from administrator (unsure whom) that NP was going to provide a script for Mrs. Philipp Ovens and have sent to pharmacy. Advised for future during times like this to have PCP have a PRN medication on boared if deem appropreicate. [name] verbalized understanding. Gave dearest condolence regarding family devastating news of pt's sister.   Nothing further at this time.

## 2020-06-13 NOTE — Telephone Encounter (Signed)
Patient's daughter is calling , states patient received some bad news and is going into a panic attack. She request something be called in for this. Please call to discuss. Daughter states she takes a Prozac every morning.

## 2020-06-13 NOTE — Telephone Encounter (Signed)
Spoke with daughter and I advise her of the fall risk, she said the problem is pt lives in a assisted living facility and there is no one to watch her 24/7, if she wakes up in the middle of the night pt has to get up and go use the bathroom by herself. Pt is still taking the prozac and she uses the tramadol around the clock, daughter said 4 times daily alternating with Tylenol.  [facility] where pt lives and they receptionist said that I had to talk to the nurse to see how to order med and put me on hold I waited for 10 min and then the phone started ringing and no on answered it, called back twice and phone just keeps ringings  FYI to PCP will try again

## 2020-06-13 NOTE — Telephone Encounter (Signed)
Patients daughter Lydia King [facility] approved) called and stated that patient is having a severe panic attack again. States she had one couple weeks ago when her brother n law passed away but now received bad news about her sister. [name] received news that her sister fell and injured her hip and had surgery today. Surgery must not have went well and patients sister is now in a coma, she is not taking the news very well and is having  "panic attacks". Lydia King is requesting some sort of medication to be called in soon as possible to help [name]. Lydia King states the facility she is at can not give patient anything with out an order. She is also requesting this be a some kind of standing order so they can keep something on hand to be able to give her as needed for times like this when they can not calm her down.

## 2020-06-14 NOTE — Telephone Encounter (Signed)
Could you please try calling again so see where I need to send px if still needed?  Thanks

## 2020-06-15 NOTE — Telephone Encounter (Signed)
Called and was able to speak to the nurse. They said that pt has not actively had a panic attack but given all of the bad news she was upset. Pt already has a standing order for lorazepam PRN but family didn't realize what that medication was so they didn't think pt was getting anything to help with situation. Nurse said that they have already given pt lorazepam today and she is fine she is calm and the med is helping and they are keeping an eye on pt. Since pt already has a standing order for lorazepam PRN they said that they don't think she needs any other medications and have already updated/educated pt's family on what lorazepam is, how pt's doing and their plan, pt has no other issues at this time.   FYI to PCP

## 2020-07-06 DIAGNOSIS — G600 Hereditary motor and sensory neuropathy: Secondary | ICD-10-CM | POA: Diagnosis not present

## 2020-07-06 DIAGNOSIS — M6281 Muscle weakness (generalized): Secondary | ICD-10-CM | POA: Diagnosis not present

## 2020-07-06 DIAGNOSIS — Z1159 Encounter for screening for other viral diseases: Secondary | ICD-10-CM | POA: Diagnosis not present

## 2020-07-09 DIAGNOSIS — G600 Hereditary motor and sensory neuropathy: Secondary | ICD-10-CM | POA: Diagnosis not present

## 2020-07-09 DIAGNOSIS — Z1159 Encounter for screening for other viral diseases: Secondary | ICD-10-CM | POA: Diagnosis not present

## 2020-07-09 DIAGNOSIS — M6281 Muscle weakness (generalized): Secondary | ICD-10-CM | POA: Diagnosis not present

## 2020-07-10 DIAGNOSIS — M6281 Muscle weakness (generalized): Secondary | ICD-10-CM | POA: Diagnosis not present

## 2020-07-10 DIAGNOSIS — G600 Hereditary motor and sensory neuropathy: Secondary | ICD-10-CM | POA: Diagnosis not present

## 2020-07-10 DIAGNOSIS — Z1159 Encounter for screening for other viral diseases: Secondary | ICD-10-CM | POA: Diagnosis not present

## 2020-07-11 ENCOUNTER — Encounter: Payer: Self-pay | Admitting: Family Medicine

## 2020-07-11 DIAGNOSIS — Z1159 Encounter for screening for other viral diseases: Secondary | ICD-10-CM | POA: Diagnosis not present

## 2020-07-11 DIAGNOSIS — K449 Diaphragmatic hernia without obstruction or gangrene: Secondary | ICD-10-CM

## 2020-07-11 DIAGNOSIS — G600 Hereditary motor and sensory neuropathy: Secondary | ICD-10-CM | POA: Diagnosis not present

## 2020-07-11 DIAGNOSIS — M6281 Muscle weakness (generalized): Secondary | ICD-10-CM | POA: Diagnosis not present

## 2020-07-12 DIAGNOSIS — Z1159 Encounter for screening for other viral diseases: Secondary | ICD-10-CM | POA: Diagnosis not present

## 2020-07-12 DIAGNOSIS — K219 Gastro-esophageal reflux disease without esophagitis: Secondary | ICD-10-CM | POA: Diagnosis not present

## 2020-07-12 DIAGNOSIS — I340 Nonrheumatic mitral (valve) insufficiency: Secondary | ICD-10-CM | POA: Diagnosis not present

## 2020-07-12 DIAGNOSIS — I350 Nonrheumatic aortic (valve) stenosis: Secondary | ICD-10-CM | POA: Diagnosis not present

## 2020-07-12 DIAGNOSIS — G600 Hereditary motor and sensory neuropathy: Secondary | ICD-10-CM | POA: Diagnosis not present

## 2020-07-12 DIAGNOSIS — M6281 Muscle weakness (generalized): Secondary | ICD-10-CM | POA: Diagnosis not present

## 2020-07-12 DIAGNOSIS — Z86718 Personal history of other venous thrombosis and embolism: Secondary | ICD-10-CM | POA: Diagnosis not present

## 2020-07-12 DIAGNOSIS — R1013 Epigastric pain: Secondary | ICD-10-CM | POA: Diagnosis not present

## 2020-07-13 DIAGNOSIS — Z1159 Encounter for screening for other viral diseases: Secondary | ICD-10-CM | POA: Diagnosis not present

## 2020-07-13 DIAGNOSIS — M6281 Muscle weakness (generalized): Secondary | ICD-10-CM | POA: Diagnosis not present

## 2020-07-13 DIAGNOSIS — G600 Hereditary motor and sensory neuropathy: Secondary | ICD-10-CM | POA: Diagnosis not present

## 2020-07-14 DIAGNOSIS — R109 Unspecified abdominal pain: Secondary | ICD-10-CM | POA: Diagnosis not present

## 2020-07-14 DIAGNOSIS — R059 Cough, unspecified: Secondary | ICD-10-CM | POA: Diagnosis not present

## 2020-07-14 DIAGNOSIS — R0989 Other specified symptoms and signs involving the circulatory and respiratory systems: Secondary | ICD-10-CM | POA: Diagnosis not present

## 2020-07-16 DIAGNOSIS — Z1159 Encounter for screening for other viral diseases: Secondary | ICD-10-CM | POA: Diagnosis not present

## 2020-07-16 DIAGNOSIS — M6281 Muscle weakness (generalized): Secondary | ICD-10-CM | POA: Diagnosis not present

## 2020-07-16 DIAGNOSIS — G600 Hereditary motor and sensory neuropathy: Secondary | ICD-10-CM | POA: Diagnosis not present

## 2020-07-16 NOTE — Telephone Encounter (Signed)
Spoke with patient's daughter and advised of appointment with [facility] tomorrow 07/17/20 at 2:15 pm with Dr Vicente Males.

## 2020-07-17 ENCOUNTER — Encounter: Payer: Self-pay | Admitting: Gastroenterology

## 2020-07-17 ENCOUNTER — Ambulatory Visit (INDEPENDENT_AMBULATORY_CARE_PROVIDER_SITE_OTHER): Payer: Medicare Other | Admitting: Gastroenterology

## 2020-07-17 ENCOUNTER — Other Ambulatory Visit: Payer: Self-pay

## 2020-07-17 VITALS — BP 114/64 | HR 72 | Temp 98.0°F | Ht 59.0 in

## 2020-07-17 DIAGNOSIS — R0789 Other chest pain: Secondary | ICD-10-CM

## 2020-07-17 DIAGNOSIS — K449 Diaphragmatic hernia without obstruction or gangrene: Secondary | ICD-10-CM

## 2020-07-17 DIAGNOSIS — I25118 Atherosclerotic heart disease of native coronary artery with other forms of angina pectoris: Secondary | ICD-10-CM | POA: Diagnosis not present

## 2020-07-17 DIAGNOSIS — K219 Gastro-esophageal reflux disease without esophagitis: Secondary | ICD-10-CM

## 2020-07-17 MED ORDER — ESOMEPRAZOLE MAGNESIUM 40 MG PO CPDR: 40.0000 mg | DELAYED_RELEASE_CAPSULE | Freq: Two times a day (BID) | ORAL | 0 refills | Status: DC

## 2020-07-17 NOTE — Progress Notes (Signed)
Jonathon Bellows MD, MRCP(U.K) [address]  [place]  [place], [place] [address]  Main: [phone]  Fax: [phone]   Gastroenterology Consultation  Referring Provider:     Abner Greenspan, MD Primary Care Physician:  Tower, Wynelle Fanny, MD Primary Gastroenterologist:  Dr. Jonathon Bellows  Reason for Consultation:     Referred by recommendations of cardiology for paraesophageal hernia and chest pain.        HPI:   Lydia King is a 87 y.o. y/o female referred for consultation & management  by Dr. Glori Bickers, Wynelle Fanny, MD.     Patient was seen in the emergency room on 05/25/2020 with chest pain.  History of heart failure on Plavix.  Patient was discussed with Dr. Rockey Situ and was given a GI cocktail and troponin levels were checked and was discharged.  Subsequently was seen by Dr. Donivan Scull team in the outpatient patient was noted to have mild to moderate mitral regurgitation and a history of drug-eluting stent placed in the LAD and circumflex.  Prior evaluation at [facility] felt to be high risk due to comorbidities for bariatric surgery.  Plan per surgery was to reevaluate hernia surgery and for second opinion.  They had discussed about referral to [facility] or cardiothoracic surgery in [place].  Dr. Rockey Situ recommended patient see a gastroenterologist for the hiatal hernia.  And she is here today to see me.  Imaging CT scan of the chest in May 2021 demonstrated intrathoracic stomach with volvulus similar to prior no CT evidence of acute obstruction.  Recent chest x-ray and labs reviewed  She came into my office to see me with her daughter.  States that she has had issues with acid reflux for many years.  Complains of heartburn and chest discomfort after meals.  Has a gurgling sensation in her chest when she moves around.  Some regurgitation but none recently.  Worse when she lays flat.  Decreased mobility she is in the wheelchair.  She says that she has discussed with surgeons in the past and she is very  high risk for surgery and hence does not want to think about it at this point of time.  She is also not interested in a second opinion to discuss about surgery at this point of time.  Past Medical History:  Diagnosis Date   (HFpEF) heart failure with preserved ejection fraction ([place])    a. 06/2019 Echo ([facility]): EF >55%, mild conc LVH. Mild-mod AS, mild MR/TR.   Anemia    in past (from blood loss at hiatal hernia)   Asthma    CAD (coronary artery disease)    a. 05/2019 MI/PCI: ost LCX (rota + cba-->3.0x16 Synergy DES), LAD 90 (3.0x16 Synergy DES).   Charcot-Marie-Tooth disease    walks with cane (fairly controlled)   Diverticulosis    DVT (deep venous thrombosis) ([place]) 2003   after sx   GERD (gastroesophageal reflux disease)    Grief reaction    on fluoxetine   Headache(784.0)    migraine   Hiatal hernia    Hyperlipidemia    Hypertension    OA (ocular albinism) ([place])    Old myocardial infarction    Osteopenia    PUD (peptic ulcer disease)    gastric, past   Shingles    Stroke ([place]) 8/10   posterior reversible encephalopathy syndrome   Urine incontinence     Past Surgical History:  Procedure Laterality Date   carotid dopplers  8/10   COLONOSCOPY  2002   HERNIA REPAIR  2002   bleeding   HIATAL HERNIA REPAIR  2000   needed blood transfusion   Stroke  8/10   hosp stroke/ posterior (vs posterior reversible encephalopathy syndrome) - d/c on Plavix   TEE normal  9/03   no emoblic source   TOTAL KNEE ARTHROPLASTY  03,06   Bilateral   VESICOVAGINAL FISTULA CLOSURE W/ TAH  1964   with bleeding and cervical dysplasia    Prior to Admission medications   Medication Sig Start Date End Date Taking? Authorizing Provider  acetaminophen (TYLENOL) 500 MG tablet Take 2 tablets (1,000 mg total) by mouth in the morning, at noon, and at bedtime. 11/04/19   Fritzi Mandes, MD  alum & mag hydroxide-simeth (MAALOX/MYLANTA) 200-200-20 MG/5ML suspension Take 30 mLs by mouth in the morning, at  noon, and at bedtime.    [provider]  aspirin EC 81 MG EC tablet Take 1 tablet (81 mg total) by mouth daily. Swallow whole. 08/05/19   Bonnielee Haff, MD  Blood Glucose Monitoring Suppl (FREESTYLE LITE) DEVI Use to check blood glucose daily and as needed for diabetes 10/11/17   Tower, Wynelle Fanny, MD  Calcium Carbonate-Vitamin D 600-400 MG-UNIT tablet Take 1 tablet by mouth daily with breakfast.     [provider]  carvedilol (COREG) 6.25 MG tablet Take 1 tablet (6.25 mg total) by mouth 2 (two) times daily with a meal. 11/04/19   Fritzi Mandes, MD  Cholecalciferol (VITAMIN D) 2000 UNITS CAPS Take 2 capsules by mouth daily.    [provider]  cloNIDine (CATAPRES) 0.1 MG tablet Take 0.1 mg by mouth 3 (three) times daily as needed.    [provider]  clopidogrel (PLAVIX) 75 MG tablet TAKE 1 TABLET BY MOUTH EVERY DAY 07/26/19   Tower, Wynelle Fanny, MD  cyanocobalamin 1000 MCG tablet Take 1,000 mcg by mouth daily.    [provider]  diclofenac Sodium (VOLTAREN) 1 % GEL Apply 2 g topically 2 (two) times daily. 03/28/20   [provider]  EPINEPHrine 0.3 mg/0.3 mL IJ SOAJ injection Inject 0.3 mg into the muscle as needed for anaphylaxis.    [provider]  esomeprazole (NEXIUM) 40 MG capsule Take 1 capsule (40 mg total) by mouth daily before breakfast. 07/27/18   Tower, Wynelle Fanny, MD  Flaxseed, Linseed, (FLAX SEED OIL) 1000 MG CAPS Take 1 capsule by mouth daily.    [provider]  FLUoxetine (PROZAC) 20 MG capsule Take 20 mg by mouth daily. 11/20/19   [provider]  furosemide (LASIX) 40 MG tablet Take 1 tablet (40 mg total) by mouth daily. 11/09/19   Tower, Marne A, MD  glucose blood (FREESTYLE LITE) test strip Use to check blood sugar once daily for DM (dx. E09.233) 11/08/18   Tower, Wynelle Fanny, MD  isosorbide mononitrate (IMDUR) 30 MG 24 hr tablet Take 1 tablet (30 mg total) by mouth 2 (two) times daily. 11/04/19 02/02/20  Fritzi Mandes, MD   Lancets (FREESTYLE) lancets Use as instructedUse to check blood sugar once daily for DM (dx. E11.618) 11/08/18   Tower, Wynelle Fanny, MD  lidocaine (LMX) 4 % cream Apply 1 application topically in the morning and at bedtime. To low back and shoulder areas 11/11/19   Tower, [place] A, MD  lisinopril (ZESTRIL) 20 MG tablet Take 40 mg by mouth daily. Take 2 tablets daily    [provider]  loperamide (IMODIUM) 2 MG capsule Take 2 mg by mouth  as needed for diarrhea or loose stools.    [provider]  loratadine (CLARITIN) 10 MG tablet TAKE 1 TABLET BY MOUTH EVERY DAY 02/28/19   Tower, Wynelle Fanny, MD  melatonin 3 MG TABS tablet Take 3 mg by mouth at bedtime.    [provider]  Menthol-Methyl Salicylate (SALONPAS PAIN RELIEF PATCH EX) Apply 1 patch topically daily.    [provider]  metFORMIN (GLUCOPHAGE XR) 500 MG 24 hr tablet Take 1 tablet (500 mg total) by mouth daily with breakfast. 11/04/19 11/03/20  Fritzi Mandes, MD  Multiple Vitamins-Minerals (PRESERVISION AREDS 2) CAPS Give one pill by mouth bid 11/09/19   Tower, Wynelle Fanny, MD  nitroGLYCERIN (NITROSTAT) 0.4 MG SL tablet Place under the tongue. 06/19/19 06/18/20  [provider]  omega-3 acid ethyl esters (LOVAZA) 1 g capsule TAKE 1 CAPSULE (1 G TOTAL) BY MOUTH DAILY. 07/26/19   Tower, Wynelle Fanny, MD  ondansetron (ZOFRAN) 4 MG tablet Take 4 mg by mouth every 4 (four) hours as needed for nausea or vomiting.    [provider]  polyethylene glycol (MIRALAX / GLYCOLAX) 17 g packet Take 17 g by mouth daily. 11/11/19   Tower, Wynelle Fanny, MD  PROAIR HFA 108 [phone] Base) MCG/ACT inhaler INHALE 2 PUFFS INTO THE LUNGS EVERY 4 (FOUR) HOURS AS NEEDED FOR WHEEZING OR SHORTNESS OF BREATH. 03/05/16   Tower, Wynelle Fanny, MD  rosuvastatin (CRESTOR) 20 MG tablet TAKE 1 TABLET BY MOUTH EVERYDAY AT BEDTIME 11/04/19   Fritzi Mandes, MD  sennosides-docusate sodium (SENOKOT-S) 8.6-50 MG tablet Take 1 tablet by mouth daily.    [provider]  traMADol (ULTRAM) 50 MG tablet Take 50 mg by mouth 4 (four) times daily as needed. 05/24/20   [provider]  trolamine salicylate (ASPERCREME/ALOE) 10 % cream Apply 1 application topically daily. To shoulder areas 11/11/19   Tower, Wynelle Fanny, MD    Family History  Problem Relation Age of Onset   Cerebral aneurysm Son    Cerebral aneurysm Father    Hyperlipidemia Father    Alcohol abuse Father    Stroke Mother    Cervical cancer Sister    Hyperlipidemia Brother    Alcohol abuse Brother    Stroke Brother    Diabetes Brother    Diabetes Sister    Sarcoidosis Sister    Other Sister        OP/ Broken hip   Other Other        whole family has charcot marie tooth   Ovarian cancer Sister      Social History   Tobacco Use   Smoking status: Never   Smokeless tobacco: Never  Vaping Use   Vaping Use: Never used  Substance Use Topics   Alcohol use: No   Drug use: No    Allergies as of 07/17/2020 - Review Complete 07/17/2020  Allergen Reaction Noted   Morphine and related Anaphylaxis 03/14/2019   Other Shortness Of Breath and Other (See Comments) 05/13/2019   Shrimp [shellfish allergy] Anaphylaxis 12/11/2015   Amlodipine besylate  12/27/2008   Atorvastatin  07/12/2007   Ciprofloxacin Other (See Comments) 09/25/2008   Influenza vaccines Other (See Comments) 12/11/2015   Iodinated diagnostic agents  11/02/2019   Nitrofurantoin  12/01/2008   Omeprazole  04/02/2009   Pantoprazole sodium  03/30/2009   Penicillin g Hives 11/12/2015   Penicillins  11/30/2006   Simvastatin  05/26/2007   Sulfonamide derivatives  11/30/2006   Tramadol Other (See Comments) 06/19/2019  Codeine Rash 11/30/2006   Prednisone Other (See Comments) 05/26/2007    Review of Systems:    All systems reviewed and negative except where noted in HPI.   Physical Exam:  BP 114/64   Pulse 72   Temp 98 F (36.7 C) (Oral)   Ht 4\' 11"  (1.499 m)   BMI 28.48 kg/m  No LMP recorded. Patient has had  a hysterectomy. Psych:  Alert and cooperative. Normal mood and affect. General:   Alert,  Well-developed, well-nourished, pleasant and cooperative in NAD Head:  Normocephalic and atraumatic. Eyes:  Sclera clear, no icterus.   Conjunctiva pink. Ears:  Normal auditory acuity. She is in a wheelchair.  Has features of arthritis in both I am symmetrical bilaterally. Neurologic:  Alert and oriented x3;  grossly normal neurologically. Psych:  Alert and cooperative. Normal mood and affect.  Imaging Studies: No results found.  Assessment and Plan:   Lydia King is a 87 y.o. y/o female has been referred for second opinion regarding hiatal hernia.  Large hernia noted on CT scan back from 2021.  Recent hospital admission for noncardiac chest pain felt due to acid reflux which is very likely the cause.  She is on Nexium once a day but despite which I am pretty certain that she probably is still continuing to have acid and nonacid reflux.  I discussed that obviously definitive therapy is surgery but with her comorbidities she probably would be high risk, offered to refer her to [facility] for second opinion but she is not interested at this point of time.  I explained that if not going for surgery I will try and maximize her medical therapy to get her as much relief as possible   Plan 1.  Suggest increasing increasing Nexium to to 40 mg twice daily.  Aim is to reduce episodes of noncardiac chest pain and avoid emergency room admissions.  If doing well at next visit can decrease to 40 mg Nexium in the morning and famotidine at night.  If needed can also add Carafate.  I told her to use Tums as needed with the aim to not use more than 1-2 times a week.  2.  Continue lifestyle changes for acid reflux including keeping the head end of the bed elevated during bedtime, avoiding eating for 2 hours before bedtime, eating food and small meals more often rather than large meals at 1 time and low fat content diet at  dinnertime.  Explained aim is to keep the hiatal hernia contents empty at bedtime.  She does have a bed which she can raise the head .  Explained that if she changed her mind about requiring a second opinion would be happy to refer.  3.  I explained that I do not see a reason to perform an EGD at this point of time as it would not provide me any more information and would not change management.  In addition the risks of the procedure outweigh the potential benefits at this point of time.  Follow up in 4 to 6 weeks video visit  Dr Jonathon Bellows MD,MRCP(U.K)

## 2020-07-18 DIAGNOSIS — Z1159 Encounter for screening for other viral diseases: Secondary | ICD-10-CM | POA: Diagnosis not present

## 2020-07-18 DIAGNOSIS — M6281 Muscle weakness (generalized): Secondary | ICD-10-CM | POA: Diagnosis not present

## 2020-07-18 DIAGNOSIS — G600 Hereditary motor and sensory neuropathy: Secondary | ICD-10-CM | POA: Diagnosis not present

## 2020-07-19 DIAGNOSIS — M6281 Muscle weakness (generalized): Secondary | ICD-10-CM | POA: Diagnosis not present

## 2020-07-19 DIAGNOSIS — Z1159 Encounter for screening for other viral diseases: Secondary | ICD-10-CM | POA: Diagnosis not present

## 2020-07-19 DIAGNOSIS — G600 Hereditary motor and sensory neuropathy: Secondary | ICD-10-CM | POA: Diagnosis not present

## 2020-07-19 NOTE — Telephone Encounter (Signed)
1. Since already on maximal nexium , stop nexium and start on dexilant 60 mg once daily .   2. Add famotidine 40 mg at night   3. TUMS PRN upto TID for heartburn breakthrough  4. Diet : low fat otherwise no restrictions . We didn't suggest any other diet for her    Herb Grays can you ensure the care  home gets these orders

## 2020-07-20 ENCOUNTER — Other Ambulatory Visit: Payer: Self-pay

## 2020-07-20 DIAGNOSIS — Z1159 Encounter for screening for other viral diseases: Secondary | ICD-10-CM | POA: Diagnosis not present

## 2020-07-20 DIAGNOSIS — G600 Hereditary motor and sensory neuropathy: Secondary | ICD-10-CM | POA: Diagnosis not present

## 2020-07-20 DIAGNOSIS — M6281 Muscle weakness (generalized): Secondary | ICD-10-CM | POA: Diagnosis not present

## 2020-07-20 MED ORDER — DEXLANSOPRAZOLE 60 MG PO CPDR: 60.0000 mg | DELAYED_RELEASE_CAPSULE | Freq: Every day | ORAL | 3 refills | Status: DC

## 2020-07-20 MED ORDER — FAMOTIDINE 40 MG PO TABS: 40.0000 mg | ORAL_TABLET | Freq: Every day | ORAL | 0 refills | Status: DC

## 2020-07-21 DIAGNOSIS — M6281 Muscle weakness (generalized): Secondary | ICD-10-CM | POA: Diagnosis not present

## 2020-07-21 DIAGNOSIS — G600 Hereditary motor and sensory neuropathy: Secondary | ICD-10-CM | POA: Diagnosis not present

## 2020-07-21 DIAGNOSIS — Z1159 Encounter for screening for other viral diseases: Secondary | ICD-10-CM | POA: Diagnosis not present

## 2020-07-23 DIAGNOSIS — H25013 Cortical age-related cataract, bilateral: Secondary | ICD-10-CM | POA: Diagnosis not present

## 2020-07-23 DIAGNOSIS — E119 Type 2 diabetes mellitus without complications: Secondary | ICD-10-CM | POA: Diagnosis not present

## 2020-07-23 LAB — HM DIABETES EYE EXAM

## 2020-07-24 DIAGNOSIS — M6281 Muscle weakness (generalized): Secondary | ICD-10-CM | POA: Diagnosis not present

## 2020-07-24 DIAGNOSIS — Z1159 Encounter for screening for other viral diseases: Secondary | ICD-10-CM | POA: Diagnosis not present

## 2020-07-24 DIAGNOSIS — G600 Hereditary motor and sensory neuropathy: Secondary | ICD-10-CM | POA: Diagnosis not present

## 2020-07-24 NOTE — Telephone Encounter (Signed)
1. What drug reaction is she having to the dexilant? 2. Yes please give her a written order for [facility]

## 2020-07-25 DIAGNOSIS — G600 Hereditary motor and sensory neuropathy: Secondary | ICD-10-CM | POA: Diagnosis not present

## 2020-07-25 DIAGNOSIS — M6281 Muscle weakness (generalized): Secondary | ICD-10-CM | POA: Diagnosis not present

## 2020-07-25 DIAGNOSIS — Z1159 Encounter for screening for other viral diseases: Secondary | ICD-10-CM | POA: Diagnosis not present

## 2020-07-26 DIAGNOSIS — G600 Hereditary motor and sensory neuropathy: Secondary | ICD-10-CM | POA: Diagnosis not present

## 2020-07-26 DIAGNOSIS — Z1159 Encounter for screening for other viral diseases: Secondary | ICD-10-CM | POA: Diagnosis not present

## 2020-07-26 DIAGNOSIS — M6281 Muscle weakness (generalized): Secondary | ICD-10-CM | POA: Diagnosis not present

## 2020-07-27 DIAGNOSIS — Z1159 Encounter for screening for other viral diseases: Secondary | ICD-10-CM | POA: Diagnosis not present

## 2020-07-27 DIAGNOSIS — M6281 Muscle weakness (generalized): Secondary | ICD-10-CM | POA: Diagnosis not present

## 2020-07-27 DIAGNOSIS — G600 Hereditary motor and sensory neuropathy: Secondary | ICD-10-CM | POA: Diagnosis not present

## 2020-07-28 DIAGNOSIS — G600 Hereditary motor and sensory neuropathy: Secondary | ICD-10-CM | POA: Diagnosis not present

## 2020-07-28 DIAGNOSIS — Z1159 Encounter for screening for other viral diseases: Secondary | ICD-10-CM | POA: Diagnosis not present

## 2020-07-28 DIAGNOSIS — M6281 Muscle weakness (generalized): Secondary | ICD-10-CM | POA: Diagnosis not present

## 2020-07-30 DIAGNOSIS — Z1159 Encounter for screening for other viral diseases: Secondary | ICD-10-CM | POA: Diagnosis not present

## 2020-07-30 DIAGNOSIS — M6281 Muscle weakness (generalized): Secondary | ICD-10-CM | POA: Diagnosis not present

## 2020-07-30 DIAGNOSIS — G600 Hereditary motor and sensory neuropathy: Secondary | ICD-10-CM | POA: Diagnosis not present

## 2020-07-31 DIAGNOSIS — M6281 Muscle weakness (generalized): Secondary | ICD-10-CM | POA: Diagnosis not present

## 2020-07-31 DIAGNOSIS — G600 Hereditary motor and sensory neuropathy: Secondary | ICD-10-CM | POA: Diagnosis not present

## 2020-07-31 DIAGNOSIS — Z1159 Encounter for screening for other viral diseases: Secondary | ICD-10-CM | POA: Diagnosis not present

## 2020-08-01 DIAGNOSIS — G600 Hereditary motor and sensory neuropathy: Secondary | ICD-10-CM | POA: Diagnosis not present

## 2020-08-01 DIAGNOSIS — M6281 Muscle weakness (generalized): Secondary | ICD-10-CM | POA: Diagnosis not present

## 2020-08-01 DIAGNOSIS — Z1159 Encounter for screening for other viral diseases: Secondary | ICD-10-CM | POA: Diagnosis not present

## 2020-08-02 DIAGNOSIS — G600 Hereditary motor and sensory neuropathy: Secondary | ICD-10-CM | POA: Diagnosis not present

## 2020-08-02 DIAGNOSIS — M6281 Muscle weakness (generalized): Secondary | ICD-10-CM | POA: Diagnosis not present

## 2020-08-02 DIAGNOSIS — Z1159 Encounter for screening for other viral diseases: Secondary | ICD-10-CM | POA: Diagnosis not present

## 2020-08-03 DIAGNOSIS — G600 Hereditary motor and sensory neuropathy: Secondary | ICD-10-CM | POA: Diagnosis not present

## 2020-08-03 DIAGNOSIS — M6281 Muscle weakness (generalized): Secondary | ICD-10-CM | POA: Diagnosis not present

## 2020-08-03 DIAGNOSIS — Z1159 Encounter for screening for other viral diseases: Secondary | ICD-10-CM | POA: Diagnosis not present

## 2020-08-09 DIAGNOSIS — G629 Polyneuropathy, unspecified: Secondary | ICD-10-CM | POA: Diagnosis not present

## 2020-08-09 DIAGNOSIS — R110 Nausea: Secondary | ICD-10-CM | POA: Diagnosis not present

## 2020-08-09 DIAGNOSIS — R197 Diarrhea, unspecified: Secondary | ICD-10-CM | POA: Diagnosis not present

## 2020-08-09 DIAGNOSIS — R252 Cramp and spasm: Secondary | ICD-10-CM | POA: Diagnosis not present

## 2020-08-10 DIAGNOSIS — R195 Other fecal abnormalities: Secondary | ICD-10-CM | POA: Diagnosis not present

## 2020-08-10 DIAGNOSIS — D519 Vitamin B12 deficiency anemia, unspecified: Secondary | ICD-10-CM | POA: Diagnosis not present

## 2020-08-10 DIAGNOSIS — R197 Diarrhea, unspecified: Secondary | ICD-10-CM | POA: Diagnosis not present

## 2020-08-10 DIAGNOSIS — A499 Bacterial infection, unspecified: Secondary | ICD-10-CM | POA: Diagnosis not present

## 2020-08-10 DIAGNOSIS — E119 Type 2 diabetes mellitus without complications: Secondary | ICD-10-CM | POA: Diagnosis not present

## 2020-08-13 DIAGNOSIS — Z724 Inappropriate diet and eating habits: Secondary | ICD-10-CM | POA: Diagnosis not present

## 2020-08-13 DIAGNOSIS — R110 Nausea: Secondary | ICD-10-CM | POA: Diagnosis not present

## 2020-08-22 ENCOUNTER — Encounter: Payer: Self-pay | Admitting: Family Medicine

## 2020-08-28 ENCOUNTER — Telehealth: Payer: Medicare Other | Admitting: Gastroenterology

## 2020-08-28 NOTE — Progress Notes (Deleted)
Lydia King , MD [address]  [place]  [place], [place] [address]  Main: [phone]  Fax: [phone]   Primary Care Physician: Tower, Wynelle Fanny, MD  Virtual Visit via Video Note  I connected with patient on 08/28/20 at  1:00 PM EDT by video and verified that I am speaking with the correct person using two identifiers.   I discussed the limitations, risks, security and privacy concerns of performing an evaluation and management service by video  and the availability of in person appointments. I also discussed with the patient that there may be a patient responsible charge related to this service. The patient expressed understanding and agreed to proceed.  Location of Patient: Home Location of Provider: Home Persons involved: Patient and provider only   History of Present Illness:   C/c : Follow up for GERD  HPI: Lydia King is a 87 y.o. female   Summary of history :  Initially referred and seen on 07/17/2020 for a hernia and chest pain. Patient was seen in the emergency room on 05/25/2020 with chest pain.  History of heart failure on Plavix.  Subsequently was seen by Dr. Donivan Scull team in the outpatient patient was noted to have mild to moderate mitral regurgitation and a history of drug-eluting stent placed in the LAD and circumflex.  Prior evaluation at [facility] felt to be high risk due to comorbidities for bariatric surgery.  Plan per surgery was to reevaluate hernia surgery and for second opinion.  They had discussed about referral to [facility] or cardiothoracic surgery in [place].    States that she has had issues with acid reflux for many years.  Complains of heartburn and chest discomfort after meals.  Has a gurgling sensation in her chest when she moves around.  Some regurgitation but none recently.  Worse when she lays flat.  Decreased mobility she is in the wheelchair.  She says that she has discussed with surgeons in the past and she is very high risk for surgery and  hence does not want to think about it at this point of time.  She was also not interested in a second opinion to discuss about surgery at this point of time.  Imaging CT scan of the chest in May 2021 demonstrated intrathoracic stomach with volvulus similar to prior no CT evidence of acute obstruction.     Interval history   07/17/2020-08/28/2020     ***      Current Outpatient Medications  Medication Sig Dispense Refill   acetaminophen (TYLENOL) 500 MG tablet Take 2 tablets (1,000 mg total) by mouth in the morning, at noon, and at bedtime. 30 tablet 0   alum & mag hydroxide-simeth (MAALOX/MYLANTA) 200-200-20 MG/5ML suspension Take 30 mLs by mouth in the morning, at noon, and at bedtime.     aspirin EC 81 MG EC tablet Take 1 tablet (81 mg total) by mouth daily. Swallow whole. 30 tablet 11   Blood Glucose Monitoring Suppl (FREESTYLE LITE) DEVI Use to check blood glucose daily and as needed for diabetes 1 each 0   Calcium Carbonate-Vitamin D 600-400 MG-UNIT tablet Take 1 tablet by mouth daily with breakfast.      Capsaicin-Menthol (SALONPAS PAIN REL GEL-PTCH HOT) 0.025-1.25 % PTCH Apply topically.     carvedilol (COREG) 6.25 MG tablet Take 1 tablet (6.25 mg total) by mouth 2 (two) times daily with a meal. 60 tablet 1   Cholecalciferol (VITAMIN D) 2000 UNITS CAPS Take 2 capsules by mouth  daily.     cloNIDine (CATAPRES) 0.1 MG tablet Take 0.1 mg by mouth 3 (three) times daily as needed.     clopidogrel (PLAVIX) 75 MG tablet TAKE 1 TABLET BY MOUTH EVERY DAY 90 tablet 3   cyanocobalamin 1000 MCG tablet Take 1,000 mcg by mouth daily.     dexlansoprazole (DEXILANT) 60 MG capsule Take 1 capsule (60 mg total) by mouth daily. 90 capsule 3   diclofenac Sodium (VOLTAREN) 1 % GEL Apply 2 g topically 2 (two) times daily.     EPINEPHrine 0.3 mg/0.3 mL IJ SOAJ injection Inject 0.3 mg into the muscle as needed for anaphylaxis.     famotidine (PEPCID) 40 MG tablet Take 1 tablet (40 mg total) by mouth at  bedtime. 30 tablet 0   Flaxseed, Linseed, (FLAX SEED OIL) 1000 MG CAPS Take 1 capsule by mouth daily.     FLUoxetine (PROZAC) 20 MG capsule Take 20 mg by mouth daily.     furosemide (LASIX) 40 MG tablet Take 1 tablet (40 mg total) by mouth daily. 1 tablet 0   glucose blood (FREESTYLE LITE) test strip Use to check blood sugar once daily for DM (dx. E11.618) 100 each 0   isosorbide mononitrate (IMDUR) 30 MG 24 hr tablet Take 1 tablet (30 mg total) by mouth 2 (two) times daily. 90 tablet 3   Lancets (FREESTYLE) lancets Use as instructedUse to check blood sugar once daily for DM (dx. E11.618) 100 each 0   lidocaine (LMX) 4 % cream Apply 1 application topically in the morning and at bedtime. To low back and shoulder areas 30 g 11   lisinopril (ZESTRIL) 20 MG tablet Take 40 mg by mouth daily. Take 2 tablets daily     loperamide (IMODIUM) 2 MG capsule Take 2 mg by mouth as needed for diarrhea or loose stools.     loratadine (CLARITIN) 10 MG tablet TAKE 1 TABLET BY MOUTH EVERY DAY 90 tablet 1   melatonin 3 MG TABS tablet Take 3 mg by mouth at bedtime.     Menthol-Methyl Salicylate (SALONPAS PAIN RELIEF PATCH EX) Apply 1 patch topically daily.     metFORMIN (GLUCOPHAGE XR) 500 MG 24 hr tablet Take 1 tablet (500 mg total) by mouth daily with breakfast. 30 tablet 11   Multiple Vitamins-Minerals (PRESERVISION AREDS 2) CAPS Give one pill by mouth bid 180 capsule 3   nitroGLYCERIN (NITROSTAT) 0.4 MG SL tablet Place under the tongue.     omega-3 acid ethyl esters (LOVAZA) 1 g capsule TAKE 1 CAPSULE (1 G TOTAL) BY MOUTH DAILY. 90 capsule 3   ondansetron (ZOFRAN) 4 MG tablet Take 4 mg by mouth every 4 (four) hours as needed for nausea or vomiting.     polyethylene glycol (MIRALAX / GLYCOLAX) 17 g packet Take 17 g by mouth daily. 14 each 11   PROAIR HFA 108 (90 Base) MCG/ACT inhaler INHALE 2 PUFFS INTO THE LUNGS EVERY 4 (FOUR) HOURS AS NEEDED FOR WHEEZING OR SHORTNESS OF BREATH. 8.5 g 3   rosuvastatin (CRESTOR) 20  MG tablet TAKE 1 TABLET BY MOUTH EVERYDAY AT BEDTIME 30 tablet 1   sennosides-docusate sodium (SENOKOT-S) 8.6-50 MG tablet Take 1 tablet by mouth daily.     traMADol (ULTRAM) 50 MG tablet Take 50 mg by mouth 4 (four) times daily as needed.     trolamine salicylate (ASPERCREME/ALOE) 10 % cream Apply 1 application topically daily. To shoulder areas 85 g 3   No current facility-administered medications for this  visit.    Allergies as of 08/28/2020 - Review Complete 07/17/2020  Allergen Reaction Noted   Morphine and related Anaphylaxis 03/14/2019   Other Shortness Of Breath and Other (See Comments) 05/13/2019   Shrimp [shellfish allergy] Anaphylaxis 12/11/2015   Amlodipine besylate  12/27/2008   Atorvastatin  07/12/2007   Ciprofloxacin Other (See Comments) 09/25/2008   Influenza vaccines Other (See Comments) 12/11/2015   Iodinated diagnostic agents  11/02/2019   Nitrofurantoin  12/01/2008   Omeprazole  04/02/2009   Pantoprazole sodium  03/30/2009   Penicillin g Hives 11/12/2015   Penicillins  11/30/2006   Simvastatin  05/26/2007   Sulfonamide derivatives  11/30/2006   Tramadol Other (See Comments) 06/19/2019   Codeine Rash 11/30/2006   Prednisone Other (See Comments) 05/26/2007    Review of Systems:    All systems reviewed and negative except where noted in HPI.  General Appearance:    Alert, cooperative, no distress, appears stated age  Head:    Normocephalic, without obvious abnormality, atraumatic  Eyes:    PERRL, conjunctiva/corneas clear,  Ears:    Grossly normal hearing    Neurologic:  Grossly normal    Observations/Objective:  Labs: CMP     Component Value Date/Time   NA 134 (L) 05/25/2020 1243   K 3.7 05/25/2020 1243   CL 96 (L) 05/25/2020 1243   CO2 28 05/25/2020 1243   GLUCOSE 153 (H) 05/25/2020 1243   BUN 15 05/25/2020 1243   CREATININE 0.82 05/25/2020 1243   CALCIUM 8.9 05/25/2020 1243   PROT 6.3 (L) 05/25/2020 1243   ALBUMIN 3.5 05/25/2020 1243   AST 14  (L) 05/25/2020 1243   ALT 14 05/25/2020 1243   ALKPHOS 81 05/25/2020 1243   BILITOT 0.9 05/25/2020 1243   GFRNONAA >60 05/25/2020 1243   GFRAA >60 08/04/2019 0443   Lab Results  Component Value Date   WBC 5.2 05/25/2020   HGB 13.1 05/25/2020   HCT 38.6 05/25/2020   MCV 89.6 05/25/2020   PLT 158 05/25/2020    Imaging Studies: No results found.  Assessment and Plan:   LAURNA PLAZA is a 87 y.o. y/o female here to follow up for  Large hernia noted on CT scan back from 2021.  Recent hospital admission for noncardiac chest pain felt due to acid reflux which is very likely the cause.  She is on Nexium once a day but despite which I am pretty certain that she probably is still continuing to have acid and nonacid reflux.  I discussed that obviously definitive therapy is surgery but with her comorbidities she probably would be high risk, offered to refer her to [facility] for second opinion but she is not interested at this point of time.  I explained that if not going for surgery I will try and maximize her medical therapy to get her as much relief as possible     Plan 1.  Suggest increasing increasing Nexium to to 40 mg twice daily.  Aim is to reduce episodes of noncardiac chest pain and avoid emergency room admissions.  If doing well at next visit can decrease to 40 mg Nexium in the morning and famotidine at night.  If needed can also add Carafate.  I told her to use Tums as needed with the aim to not use more than 1-2 times a week.  2.  Continue lifestyle changes for acid reflux   3.  I explained that I do not see a reason to perform an EGD at this  point of time as it would not provide me any more information and would not change management.  In addition the risks of the procedure outweigh the potential benefits at this point of time.     I discussed the assessment and treatment plan with the patient. The patient was provided an opportunity to ask questions and all were answered. The patient  agreed with the plan and demonstrated an understanding of the instructions.   The patient was advised to call back or seek an in-person evaluation if the symptoms worsen or if the condition fails to improve as anticipated.  I provided *** minutes of face-to-face time during this encounter.  Dr Lydia Bellows MD,MRCP [facility]) Gastroenterology/Hepatology Pager: [phone]   Speech recognition software was used to dictate this note.

## 2020-08-30 DIAGNOSIS — E1140 Type 2 diabetes mellitus with diabetic neuropathy, unspecified: Secondary | ICD-10-CM | POA: Diagnosis not present

## 2020-08-30 DIAGNOSIS — L603 Nail dystrophy: Secondary | ICD-10-CM | POA: Diagnosis not present

## 2020-09-07 DIAGNOSIS — H353131 Nonexudative age-related macular degeneration, bilateral, early dry stage: Secondary | ICD-10-CM | POA: Diagnosis not present

## 2020-09-07 DIAGNOSIS — H2511 Age-related nuclear cataract, right eye: Secondary | ICD-10-CM | POA: Diagnosis not present

## 2020-09-07 DIAGNOSIS — H18413 Arcus senilis, bilateral: Secondary | ICD-10-CM | POA: Diagnosis not present

## 2020-09-07 DIAGNOSIS — H25043 Posterior subcapsular polar age-related cataract, bilateral: Secondary | ICD-10-CM | POA: Diagnosis not present

## 2020-09-07 DIAGNOSIS — H2513 Age-related nuclear cataract, bilateral: Secondary | ICD-10-CM | POA: Diagnosis not present

## 2020-09-11 ENCOUNTER — Telehealth: Payer: Self-pay | Admitting: Cardiovascular Disease

## 2020-09-11 NOTE — Telephone Encounter (Signed)
   [facility] Pre-operative Risk Assessment    Patient Name: Lydia King  DOB: 24-Jul-1930 MRN: [id number]  HEARTCARE STAFF:  - IMPORTANT!!!!!! Under Visit Info/Reason for Call, type in Other and utilize the format Clearance MM/DD/YY or Clearance TBD. Do not use dashes or single digits. - Please review there is not already an duplicate clearance open for this procedure. - If request is for dental extraction, please clarify the # of teeth to be extracted. - If the patient is currently at the dentist's office, call Pre-Op Callback Staff (MA/nurse) to input urgent request.  - If the patient is not currently in the dentist office, please route to the Pre-Op pool.  Request for surgical clearance:  What type of surgery is being performed? Cataract extraction w/ intraocular lens implantation of the right followed left eye   When is this surgery scheduled? 11/19/20  What type of clearance is required (medical clearance vs. Pharmacy clearance to hold med vs. Both)? Both   Are there any medications that need to be held prior to surgery and how long? Not listed, please advise if needed  Practice name and name of physician performing surgery? [facility]  What is the office phone number? [phone]   7.   What is the office fax number? [phone]  8.   Anesthesia type (None, local, MAC, general) ? Topical with IV medication    Ace Gins 09/11/2020, 4:15 PM  _________________________________________________________________   (provider comments below)

## 2020-09-11 NOTE — Telephone Encounter (Signed)
   Patient Name: DEONNI NASH  DOB: Jan 04, 1931 MRN: [id number]  Primary Cardiologist: Ida Rogue, MD  Chart reviewed as part of pre-operative protocol coverage. Cataract extractions are recognized in guidelines as low risk surgeries that do not typically require specific preoperative testing or holding of blood thinner therapy. Therefore, given past medical history and time since last visit, based on ACC/AHA guidelines, Alita E Anker would be at acceptable risk for the planned procedure without further cardiovascular testing.   I will route this recommendation to the requesting party via Epic fax function and remove from pre-op pool.  Please call with questions.  Abigail Butts, PA-C 09/11/2020, 4:34 PM

## 2020-09-12 DIAGNOSIS — R0602 Shortness of breath: Secondary | ICD-10-CM | POA: Diagnosis not present

## 2020-09-16 NOTE — Progress Notes (Signed)
Cardiology Office Note  Date:  09/17/2020   ID:  Lydia King, DOB Apr 16, 1930, MRN [id number]  PCP:  Abner Greenspan, MD   Chief Complaint  Patient presents with   3 month follow up     Patient c/o mid-sternum pain/pressure and shortness of breath with little exertion. Medications reviewed by the patient verbally.     HPI:  87 y.o. female with history of CAD  Three-vessel coronary disease on  catheterization May 2021,  stent placed to LAD and left circumflex Mild to moderate AS on echocardiogram Right femur fracture, February 2021 slow recovery hypertension,  HFpEF  Recent hospital mission for CHF July 2021  Type 2 diabetes Presenting for follow-up of her acute on chronic diastolic CHF  In follow-up today ports having worsening Sx of pain from hiatal hernia Back pain radiating to front Reports having difficulty getting her pain medication, Tylenol does not work for tramadol helps a little bit Even with the tramadol has breakthrough pain Very interested in having corrective surgery for her hiatal hernia  Periodic diarrhea Denies anginal symptoms Denies lower extremity edema  Prior imaging reviewed CT chest 05/2019  Intrathoracic stomach with organoaxial volvulus   EKG personally reviewed by myself on todays visit Shows normal sinus rhythm rate 72 bpm no significant ST-T wave changes,    Echo 06/2019 PRESERVED LV FUNCTION AND NORMAL WALL MOTION  MILD CONCENTRIC LVH  MILD MODERATE AORTIC STENOSIS  NORMAL RV FUNCTION  MILD MR AND TR  TRIVIAL EFFUSION NOTED WITHOUT HEMODYNAMIC COMPROMISE  RIGHT PLEURAL EFFUSION NOTED  COMPARED TO PREVIOUS EXAM THERE ARE NO SIGNIFICANT CHANGES    Prior studies reviewed PCI (06/17/2019, [facility]): Successful PCI of the ostial LAD using 3.0 x 16 mm drug-eluting stent. Successful rotational atherectomy and PCI to ostial LCx using 3.0 x 16 mm drug-eluting stent.   LHC (06/15/2019, [facility]): Severe three-vessel coronary artery disease  Details  as above   Echocardiogram (06/15/2019, [facility]):  LVEF greater than 55% with mild LVH. Normal RV contraction. Mild MR. Moderate aortic stenosis. Mild to moderate TR  In the hospital July 2021 for shortness of breath, CHF symptoms  was treated with IV Lasix Good diuresis, discharged on Lasix 40 milligrams daily     PMH:   has a past medical history of (HFpEF) heart failure with preserved ejection fraction ([place]), Anemia, Asthma, CAD (coronary artery disease), Charcot-Marie-Tooth disease, Diverticulosis, DVT (deep venous thrombosis) ([place]) (2003), GERD (gastroesophageal reflux disease), Grief reaction, Headache(784.0), Hiatal hernia, Hyperlipidemia, Hypertension, OA (ocular albinism) ([place]), Old myocardial infarction, Osteopenia, PUD (peptic ulcer disease), Shingles, Stroke ([place]) (8/10), and Urine incontinence.  PSH:    Past Surgical History:  Procedure Laterality Date   carotid dopplers  8/10   COLONOSCOPY  2002   HERNIA REPAIR  2002   bleeding   HIATAL HERNIA REPAIR  2000   needed blood transfusion   Stroke  8/10   hosp stroke/ posterior (vs posterior reversible encephalopathy syndrome) - d/c on Plavix   TEE normal  [id number]   no emoblic source   TOTAL KNEE ARTHROPLASTY  03,06   Bilateral   VESICOVAGINAL FISTULA CLOSURE W/ TAH  1964   with bleeding and cervical dysplasia    Current Outpatient Medications  Medication Sig Dispense Refill   acetaminophen (TYLENOL) 500 MG tablet Take 2 tablets (1,000 mg total) by mouth in the morning, at noon, and at bedtime. 30 tablet 0   alum & mag hydroxide-simeth (MAALOX/MYLANTA) 200-200-20 MG/5ML suspension Take 30 mLs by mouth in the  morning, at noon, and at bedtime.     aspirin EC 81 MG EC tablet Take 1 tablet (81 mg total) by mouth daily. Swallow whole. 30 tablet 11   Calcium Carbonate-Vitamin D 600-400 MG-UNIT tablet Take 1 tablet by mouth daily with breakfast.      carvedilol (COREG) 6.25 MG tablet Take 1 tablet (6.25 mg total) by mouth 2 (two) times  daily with a meal. 60 tablet 1   clopidogrel (PLAVIX) 75 MG tablet TAKE 1 TABLET BY MOUTH EVERY DAY 90 tablet 3   diclofenac Sodium (VOLTAREN) 1 % GEL Apply 2 g topically 2 (two) times daily.     esomeprazole (NEXIUM) 40 MG capsule Take 40 mg by mouth daily at 12 noon.     Flaxseed, Linseed, (FLAX SEED OIL) 1000 MG CAPS Take 1 capsule by mouth daily.     FLUoxetine (PROZAC) 20 MG capsule Take 20 mg by mouth daily.     furosemide (LASIX) 40 MG tablet Take 1 tablet (40 mg total) by mouth daily. 1 tablet 0   glucose blood (FREESTYLE LITE) test strip Use to check blood sugar once daily for DM (dx. E11.618) 100 each 0   isosorbide mononitrate (IMDUR) 30 MG 24 hr tablet Take 1 tablet (30 mg total) by mouth 2 (two) times daily. 90 tablet 3   Lancets (FREESTYLE) lancets Use as instructedUse to check blood sugar once daily for DM (dx. E11.618) 100 each 0   lidocaine (LMX) 4 % cream Apply 1 application topically in the morning and at bedtime. To low back and shoulder areas 30 g 11   lisinopril (ZESTRIL) 20 MG tablet Take 40 mg by mouth daily.     loratadine (CLARITIN) 10 MG tablet TAKE 1 TABLET BY MOUTH EVERY DAY 90 tablet 1   meloxicam (MOBIC) 7.5 MG tablet Take 7.5 mg by mouth daily.     Menthol-Methyl Salicylate (SALONPAS PAIN RELIEF PATCH EX) Apply 1 patch topically daily.     metFORMIN (GLUCOPHAGE XR) 500 MG 24 hr tablet Take 1 tablet (500 mg total) by mouth daily with breakfast. 30 tablet 11   Multiple Vitamins-Minerals (PRESERVISION AREDS 2) CAPS Give one pill by mouth bid 180 capsule 3   omega-3 acid ethyl esters (LOVAZA) 1 g capsule TAKE 1 CAPSULE (1 G TOTAL) BY MOUTH DAILY. 90 capsule 3   PROAIR HFA 108 (90 Base) MCG/ACT inhaler INHALE 2 PUFFS INTO THE LUNGS EVERY 4 (FOUR) HOURS AS NEEDED FOR WHEEZING OR SHORTNESS OF BREATH. 8.5 g 3   rosuvastatin (CRESTOR) 20 MG tablet TAKE 1 TABLET BY MOUTH EVERYDAY AT BEDTIME 30 tablet 1   trolamine salicylate (ASPERCREME/ALOE) 10 % cream Apply 1 application  topically daily. To shoulder areas 85 g 3   Blood Glucose Monitoring Suppl (FREESTYLE LITE) DEVI Use to check blood glucose daily and as needed for diabetes 1 each 0   Capsaicin-Menthol (SALONPAS PAIN REL GEL-PTCH HOT) 0.025-1.25 % PTCH Apply topically.     Cholecalciferol (VITAMIN D) 2000 UNITS CAPS Take 2 capsules by mouth daily.     cloNIDine (CATAPRES) 0.1 MG tablet Take 0.1 mg by mouth 3 (three) times daily as needed.     cyanocobalamin 1000 MCG tablet Take 1,000 mcg by mouth daily.     dexlansoprazole (DEXILANT) 60 MG capsule Take 1 capsule (60 mg total) by mouth daily. (Patient not taking: Reported on 09/17/2020) 90 capsule 3   EPINEPHrine 0.3 mg/0.3 mL IJ SOAJ injection Inject 0.3 mg into the muscle as needed for anaphylaxis.  famotidine (PEPCID) 40 MG tablet Take 1 tablet (40 mg total) by mouth at bedtime. (Patient not taking: Reported on 09/17/2020) 30 tablet 0   loperamide (IMODIUM) 2 MG capsule Take 2 mg by mouth as needed for diarrhea or loose stools. (Patient not taking: Reported on 09/17/2020)     melatonin 3 MG TABS tablet Take 3 mg by mouth at bedtime. (Patient not taking: Reported on 09/17/2020)     nitroGLYCERIN (NITROSTAT) 0.4 MG SL tablet Place under the tongue. (Patient not taking: Reported on 09/17/2020)     ondansetron (ZOFRAN) 4 MG tablet Take 4 mg by mouth every 4 (four) hours as needed for nausea or vomiting. (Patient not taking: Reported on 09/17/2020)     polyethylene glycol (MIRALAX / GLYCOLAX) 17 g packet Take 17 g by mouth daily. (Patient not taking: Reported on 09/17/2020) 14 each 11   sennosides-docusate sodium (SENOKOT-S) 8.6-50 MG tablet Take 1 tablet by mouth daily. (Patient not taking: Reported on 09/17/2020)     traMADol (ULTRAM) 50 MG tablet Take 50 mg by mouth 4 (four) times daily as needed. (Patient not taking: Reported on 09/17/2020)     No current facility-administered medications for this visit.     Allergies:   Morphine and related, Other, Shrimp  [shellfish allergy], Amlodipine, Amlodipine besylate, Atorvastatin, Ciprofloxacin, Influenza vaccines, Iodinated diagnostic agents, Nitrofurantoin, Omeprazole, Pantoprazole sodium, Pantoprazole sodium, Penicillin g, Penicillins, Simvastatin, Sulfa antibiotics, Sulfonamide derivatives, Tramadol, Codeine, and Prednisone   Social History:  The patient  reports that she has never smoked. She has never used smokeless tobacco. She reports that she does not drink alcohol and does not use drugs.   Family History:   family history includes Alcohol abuse in her brother and father; Cerebral aneurysm in her father and son; Cervical cancer in her sister; Diabetes in her brother and sister; Hyperlipidemia in her brother and father; Other in her sister and another family member; Ovarian cancer in her sister; Sarcoidosis in her sister; Stroke in her brother and mother.    Review of Systems: Review of Systems  Constitutional: Negative.   HENT: Negative.    Respiratory: Negative.    Cardiovascular: Negative.   Gastrointestinal:  Positive for diarrhea.  Musculoskeletal:  Positive for back pain.  Neurological: Negative.   Psychiatric/Behavioral: Negative.    All other systems reviewed and are negative.  PHYSICAL EXAM: VS:  BP 140/80 (BP Location: Left Arm, Patient Position: Sitting, Cuff Size: Normal)   Pulse 72   Ht '4\' 10"'$  (1.473 m)   Wt 142 lb (64.4 kg)   SpO2 98%   BMI 29.68 kg/m  , BMI Body mass index is 29.68 kg/m. GEN: Well nourished, well developed, in no acute distress, presents in a wheelchair HEENT: normal Neck: no JVD, carotid bruits, or masses Cardiac: RRR; no murmurs, rubs, or gallops,no edema  Respiratory:  clear to auscultation bilaterally, normal work of breathing GI: soft, nontender, nondistended, + BS MS: no deformity or atrophy Skin: warm and dry, no rash Neuro:  Strength and sensation are intact Psych: euthymic mood, full affect   Recent Labs: 10/31/2019: TSH 1.45 05/25/2020:  ALT 14; B Natriuretic Peptide 243.5; BUN 15; Creatinine, Ser 0.82; Hemoglobin 13.1; Platelets 158; Potassium 3.7; Sodium 134    Lipid Panel Lab Results  Component Value Date   CHOL 179 10/31/2019   HDL 57.60 10/31/2019   LDLCALC 85 10/31/2019   TRIG 180.0 (H) 10/31/2019      Wt Readings from Last 3 Encounters:  09/17/20 142 lb (64.4  kg)  06/11/20 141 lb (64 kg)  05/25/20 133 lb 9.6 oz (60.6 kg)       ASSESSMENT AND PLAN:  Problem List Items Addressed This Visit       Cardiology Problems   Essential hypertension   CAD (coronary artery disease) - Primary     Other   Hyperlipidemia associated with type 2 diabetes mellitus ([place])   Other Visit Diagnoses     Chronic diastolic heart failure (HCC)       Atypical chest pain       Paraesophageal hernia       Type 2 diabetes mellitus with other circulatory complication, without long-term current use of insulin (HCC)          Large hiatal hernia Having worsening symptoms Recommend she talk with [facility] Dr. Margart Sickles and his associates From cardiac perspective would be acceptable risk for procedure Would be able to stop the Plavix for surgery She feels that she is reached a point where she has to have the surgery and can no longer persevere with pain medications alone  Diastolic CHF Euvolemic on today's visit, no changes to her medications  CAD with stable angina On aspirin Plavix statin beta-blocker If surgery needed can hold the Plavix  Essential hypertension Blood pressure little bit high we will start clonidine 0.1 twice daily Continue other medications  Cad with stable angina Continue aspirin, Plavix, statin, beta-blocker  Aortic valve stenosis Mild to moderate stenosis on prior echocardiogram,  Stable    Total encounter time more than 35 minutes  Greater than 50% was spent in counseling and coordination of care with the patient    Signed, Esmond Plants, M.D., Ph.D. [place], [place]

## 2020-09-17 ENCOUNTER — Ambulatory Visit (INDEPENDENT_AMBULATORY_CARE_PROVIDER_SITE_OTHER): Payer: Medicare Other | Admitting: Cardiovascular Disease

## 2020-09-17 ENCOUNTER — Encounter: Payer: Self-pay | Admitting: Cardiovascular Disease

## 2020-09-17 ENCOUNTER — Other Ambulatory Visit: Payer: Self-pay

## 2020-09-17 VITALS — BP 140/80 | HR 72 | Ht <= 58 in | Wt 142.0 lb

## 2020-09-17 DIAGNOSIS — R0789 Other chest pain: Secondary | ICD-10-CM

## 2020-09-17 DIAGNOSIS — I5032 Chronic diastolic (congestive) heart failure: Secondary | ICD-10-CM

## 2020-09-17 DIAGNOSIS — E1159 Type 2 diabetes mellitus with other circulatory complications: Secondary | ICD-10-CM | POA: Diagnosis not present

## 2020-09-17 DIAGNOSIS — E1169 Type 2 diabetes mellitus with other specified complication: Secondary | ICD-10-CM | POA: Diagnosis not present

## 2020-09-17 DIAGNOSIS — I10 Essential (primary) hypertension: Secondary | ICD-10-CM | POA: Diagnosis not present

## 2020-09-17 DIAGNOSIS — K449 Diaphragmatic hernia without obstruction or gangrene: Secondary | ICD-10-CM

## 2020-09-17 DIAGNOSIS — I25118 Atherosclerotic heart disease of native coronary artery with other forms of angina pectoris: Secondary | ICD-10-CM | POA: Diagnosis not present

## 2020-09-17 DIAGNOSIS — E785 Hyperlipidemia, unspecified: Secondary | ICD-10-CM

## 2020-09-17 NOTE — Patient Instructions (Addendum)
Medication Instructions:  Clonidine 0.1 mg twice a day  Tramadol 50 mg up to four times a day as needed for back/chest pain  If you need a refill on your cardiac medications before your next appointment, please call your pharmacy.    Lab work: No new labs needed   If you have labs (blood work) drawn today and your tests are completely normal, you will receive your results only by: [place] (if you have MyChart) OR A paper copy in the mail If you have any lab test that is abnormal or we need to change your treatment, we will call you to review the results.   Testing/Procedures: No new testing needed   Follow-Up: At [facility], you and your health needs are our priority.  As part of our continuing mission to provide you with exceptional heart care, we have created designated Provider Care Teams.  These Care Teams include your primary Cardiologist (physician) and Advanced Practice Providers (APPs -  Physician Assistants and Nurse Practitioners) who all work together to provide you with the care you need, when you need it.  You will need a follow up appointment in 6 months  Providers on your designated Care Team:   Murray Hodgkins, NP Christell Faith, PA-C Marrianne Mood, PA-C Cadence Kathlen Mody, [place]  Any Other Special Instructions Will Be Listed Below (If Applicable).  COVID-19 Vaccine Information can be found at: [email or web address].uk For questions related to vaccine distribution or appointments, please email [email or web address] or call [phone].

## 2020-09-18 ENCOUNTER — Telehealth: Payer: Self-pay | Admitting: Cardiovascular Disease

## 2020-09-18 DIAGNOSIS — K449 Diaphragmatic hernia without obstruction or gangrene: Secondary | ICD-10-CM

## 2020-09-18 NOTE — Telephone Encounter (Signed)
Caretaker states Dr. Rockey Situ prescribed Tramadol for patient and this medication is on her allergy list. Please call to discuss.

## 2020-09-18 NOTE — Telephone Encounter (Signed)
Spoke with Jasmine at facility and she was calling to clarify. She reports that on paperwork from visit has Tramadol (Ultram) listed on AVS but patient has this listed on her allergy list. They have not given it to her due to that allergy alert. Advised that I would check with provider but they may need to check with her primary care provider for other alternative medication since we do not order pain medication here in our office. Reviewed that I would check with provider first and call back with further instruction. She verbalized understanding with no further questions.

## 2020-09-19 ENCOUNTER — Telehealth: Payer: Self-pay | Admitting: Cardiovascular Disease

## 2020-09-19 NOTE — Telephone Encounter (Signed)
Referral placed to [facility] gastro Surgeon Cammie Sickle, MD Electronically placed in Epic as external referral request

## 2020-09-19 NOTE — Telephone Encounter (Signed)
Patient returning call.

## 2020-09-19 NOTE — Telephone Encounter (Signed)
Patient daughter calling Would like for Dr Rockey Situ to place a referral for Dr Cammie Sickle at [facility]  Ph [phone] Fax [phone]

## 2020-09-19 NOTE — Telephone Encounter (Signed)
Attempted to call pt's daughter [name] approved) following up on pt's Tramadol order, could not reach pt, LMTCB to discuss medication.    I am not aware of allergy, discussed this with the patient and patient's daughter on their visit  Can we call the patient's daughter and find out if there is an allergy  Unless they have documentation of a particular reaction, and both patient and daughter willing to retry, I think we try Ultram/tramadol again  If she does have a reaction nursing home will need to document what type of reaction  She is having significant pain that is why I put in the tramadol which was on her list originally.  Daughter was very discouraged they were not giving tramadol for her pain  Thx  TG

## 2020-09-19 NOTE — Telephone Encounter (Signed)
Was able to call [employer] [facility] and spoke with Lydia King regarding pt Lydia King. Advised spoke with her daughter Lydia King, and she advised to remove Tramadol from pt's allergy list and to administer   Tramadol 50 mg QID PRN for pain  Was able to let Lydia King know that Lydia King should also be calling to give her verbal consent as well regarding removal of tramadol from allergy list so that pt may receive her pain medication. Lydia King verbalized understanding. She request orders to be faxed for record keeping.   RN will fax over orders to fax number [phone]  Also was able to let Lydia King know as an FYI that our office placed referral for Lydia King to see Lydia King with [facility] gastro for pt's large paraesophageal hernia. Lydia King asked if we could also send this info so they can place in pt's chart to follow her medical records. Will send a copy of this

## 2020-09-19 NOTE — Addendum Note (Signed)
Addended by: Wynema Birch on: 09/19/2020 01:55 PM   Modules accepted: Orders

## 2020-09-19 NOTE — Telephone Encounter (Signed)
Was able to return call to [facility] ([facility] approved). Lydia King advised that she called pt's (Lydia King) facility on Monday 8/22 and explain to them that pt is NOT ALLERGIC TO TRAMADOL and to administer her Tramadol 50 mg QID PRN as needed for pain.   Jasmine from [facility] called yesterday and spoke with Lydia Patella, RN regarding pt's allergy and have not administer Tramadol d/t allergy alert. RN routed message to Lydia King who advised  I am not aware of allergy, discussed this with the patient and patient's daughter on their visit  Can we call the patient's daughter and find out if there is an allergy  Unless they have documentation of a particular reaction, and both patient and daughter willing to retry, I think we try Ultram/tramadol again  If she does have a reaction nursing home will need to document what type of reaction  She is having significant pain that is why I put in the tramadol which was on her list originally.  Daughter was very discouraged they were not giving tramadol for her pain  Thx  TG  Lydia King reports, over a year ago she (Lydia King) was on Tramadol, with Tylenol 3 (w/codeine), and another medication that all 3 combine could have contributed to pt's prior stroke. Has taken Tramadol in the past and pt was fine with medication, no rash, hives, sob, or other allergic reactions associated with taking Tramadol. Lydia King reports the facility [facility]) "were the ones who put that on her allergy list so now has been updating it and that should not be the case". Lydia King gives verbal permission over the phone to "REMOVE TRAMADOL from her current allergy list".   Tramadol remove from allergy list at this time.  Lydia King to call her mother's facility back and advised them to remove this from her mother's allergy list as well, this RN will call the facility and advised them to proceed with administering Tramadol 50 mg QID PRN for pain.   Lydia King also reports was advised by Dr.  Rockey King at Monday visit to reach out to gastro surgeon Lydia Sickle, MD with [facility] regarding pt's Large paraesophageal hernia, likely type 3. Office staff advised could see pt w/o referral, advised will place referral for pt to be seen at their office. Lydia King very thankful.   Nothing further at this time, Lydia King very grateful for following-up on her mother, Lydia King. For any more concerns or issues will call back.  RN placed referral order, Marissa Calamity, aware and will puch referral through to [facility].  RN will call facility at [facility] to advised pt to take Tramadol.

## 2020-09-24 DIAGNOSIS — N390 Urinary tract infection, site not specified: Secondary | ICD-10-CM | POA: Diagnosis not present

## 2020-09-24 DIAGNOSIS — A499 Bacterial infection, unspecified: Secondary | ICD-10-CM | POA: Diagnosis not present

## 2020-09-25 NOTE — Telephone Encounter (Signed)
Was able to call [name] from [facility] surgery with Dr. Margart Sickles, she advised "in order to see this patient we will need her current gastro diagnosis, recent upper endoscopy & barium swallow studies, along with all past-operative notes".   Advised that this referral came from cardiology as pt is uncomfortable with her hiatal hernia, according to Dr. Donivan Scull notes. Referral was placed as office could not see or consult pt without the referral.   "Large hiatal hernia Having worsening symptoms Recommend she talk with specialists at [facility] Dr. Margart Sickles and his associates From cardiac perspective would be acceptable risk for procedure Would be able to stop the Plavix for surgery She feels that she is reached a point where she has to have the surgery and can no longer persevere with pain medications alone"  Pt did see GI with [facility] 07/17/6020, noted in [place] notes from Dr. Jonathon Bellows   3.  "I explained that I do not see a reason to perform an EGD at this point of time as it would not provide me any more information and would not change management.  In addition the risks of the procedure outweigh the potential benefits at this point of time".   At this time, do not see recent any GI surgeries or procedures other then colonoscopy in 2002 to send in with referral. Will r/t Dr. Rockey Situ as Jul[facility]ier

## 2020-09-25 NOTE — Telephone Encounter (Signed)
Please call to discuss referral that was placed.

## 2020-09-26 DIAGNOSIS — G894 Chronic pain syndrome: Secondary | ICD-10-CM | POA: Diagnosis not present

## 2020-09-26 DIAGNOSIS — E871 Hypo-osmolality and hyponatremia: Secondary | ICD-10-CM | POA: Diagnosis not present

## 2020-09-26 DIAGNOSIS — K449 Diaphragmatic hernia without obstruction or gangrene: Secondary | ICD-10-CM | POA: Diagnosis not present

## 2020-09-26 DIAGNOSIS — D649 Anemia, unspecified: Secondary | ICD-10-CM | POA: Diagnosis not present

## 2020-09-26 DIAGNOSIS — Z885 Allergy status to narcotic agent status: Secondary | ICD-10-CM | POA: Diagnosis not present

## 2020-09-26 DIAGNOSIS — N390 Urinary tract infection, site not specified: Secondary | ICD-10-CM | POA: Diagnosis not present

## 2020-09-26 DIAGNOSIS — Z7689 Persons encountering health services in other specified circumstances: Secondary | ICD-10-CM | POA: Diagnosis not present

## 2020-09-27 NOTE — Telephone Encounter (Signed)
Good morning . I happened to see her for the first time a few weeks back and have not performed or ordered any tests on her. WE can inform [facility] that none of what they have asked for have been done here. My last office note can be sent over. If they do require anything to be done here we can assist. I think she was told previously by a surgeon at either [facility] or [facility] she was high risk for reflux surgery .   Regards  Dhamar Gregory

## 2020-09-28 ENCOUNTER — Emergency Department
Admission: EM | Admit: 2020-09-28 | Discharge: 2020-09-28 | Disposition: A | Discharge status: Home / Self Care | Payer: Medicare Other | Attending: Emergency Medicine | Admitting: Emergency Medicine

## 2020-09-28 ENCOUNTER — Encounter: Payer: Self-pay | Admitting: Nurse Practitioner

## 2020-09-28 ENCOUNTER — Non-Acute Institutional Stay: Payer: Medicare Other | Admitting: Nurse Practitioner

## 2020-09-28 ENCOUNTER — Other Ambulatory Visit: Payer: Self-pay

## 2020-09-28 VITALS — BP 154/76 | HR 86 | Temp 98.3°F | Resp 18 | Wt 142.5 lb

## 2020-09-28 DIAGNOSIS — Z7984 Long term (current) use of oral hypoglycemic drugs: Secondary | ICD-10-CM | POA: Insufficient documentation

## 2020-09-28 DIAGNOSIS — Z7901 Long term (current) use of anticoagulants: Secondary | ICD-10-CM | POA: Diagnosis not present

## 2020-09-28 DIAGNOSIS — Z515 Encounter for palliative care: Secondary | ICD-10-CM

## 2020-09-28 DIAGNOSIS — L989 Disorder of the skin and subcutaneous tissue, unspecified: Secondary | ICD-10-CM | POA: Diagnosis not present

## 2020-09-28 DIAGNOSIS — Z79899 Other long term (current) drug therapy: Secondary | ICD-10-CM | POA: Insufficient documentation

## 2020-09-28 DIAGNOSIS — E119 Type 2 diabetes mellitus without complications: Secondary | ICD-10-CM | POA: Insufficient documentation

## 2020-09-28 DIAGNOSIS — I110 Hypertensive heart disease with heart failure: Secondary | ICD-10-CM | POA: Insufficient documentation

## 2020-09-28 DIAGNOSIS — I5032 Chronic diastolic (congestive) heart failure: Secondary | ICD-10-CM | POA: Diagnosis not present

## 2020-09-28 DIAGNOSIS — I2510 Atherosclerotic heart disease of native coronary artery without angina pectoris: Secondary | ICD-10-CM | POA: Diagnosis not present

## 2020-09-28 DIAGNOSIS — R600 Localized edema: Secondary | ICD-10-CM

## 2020-09-28 DIAGNOSIS — Z7982 Long term (current) use of aspirin: Secondary | ICD-10-CM | POA: Diagnosis not present

## 2020-09-28 DIAGNOSIS — J45909 Unspecified asthma, uncomplicated: Secondary | ICD-10-CM | POA: Insufficient documentation

## 2020-09-28 DIAGNOSIS — Z96653 Presence of artificial knee joint, bilateral: Secondary | ICD-10-CM | POA: Insufficient documentation

## 2020-09-28 DIAGNOSIS — R58 Hemorrhage, not elsewhere classified: Secondary | ICD-10-CM | POA: Diagnosis not present

## 2020-09-28 NOTE — ED Triage Notes (Signed)
Pt arrived to ed via ems from [employer] [facility]. Ems reports pt had a pimple on rt cheek that pt popped and created a blood blister. Pt then hit the area yesterday causing it to bleed. Nurse at [facility] attempted to pop the area today with a needle, when removing the needle blood started coming out of pin hole so nurse covered site and sent pt to ed to have area checked. Nad noted at this time, bandage over area at this time.

## 2020-09-28 NOTE — Progress Notes (Signed)
[place] Consult Note Telephone: [phone]  Fax: [phone]    Date of encounter: 09/28/20 5:44 PM PATIENT NAME: Lydia King [place] [id number]   [phone] (home)  DOB: 08-Jan-1931 MRN: [id number] PRIMARY CARE PROVIDER:    Dr Lucianne Lei [facility]  RESPONSIBLE PARTY:    Contact Information     Name Relation Home Work Mobile   [place] Daughter [phone]  [phone]   [name] Daughter   [phone]      I met face to face with patient in facility. Palliative Care was asked to follow this patient by consultation request of Dr Nona Dell to address advance care planning and complex medical decision making. This is a follow up visit.                                  ASSESSMENT AND PLAN / RECOMMENDATIONS:  Symptom Management/Plan: 1. Advance Care Planning; DNR in [place]; reviewed, wishes are to treat what is treatable, hospitalization if needed   2. Goals of Care: Goals include to maximize quality of life and symptom management. Our advance care planning conversation included a discussion about:    The value and importance of advance care planning  Exploration of personal, cultural or spiritual beliefs that might influence medical decisions  Exploration of goals of care in the event of a sudden injury or illness  Identification and preparation of a healthcare agent  Review and updating or creation of an  advance directive document.   3. Palliative care encounter; Palliative care encounter; Palliative medicine team will continue to support patient, patient's family, and medical team. Visit consisted of counseling and education dealing with the complex and emotionally intense issues of symptom management and palliative care in the setting of serious and potentially life-threatening illness  4. Shortness of breath/edema stable, reviewed weights, no edema. No recent exacerbation.  Continue to elevate legs, compression hose   5. f/u 8 weeks for ongoing monitoring str, progression, planning for d/c plans or transition to LTC with family, goc  Follow up Palliative Care Visit: Palliative care will continue to follow for complex medical decision making, advance care planning, and clarification of goals. Return 8 weeks or prn.  I spent 37 minutes providing this consultation. More than 50% of the time in this consultation was spent in counseling and care coordination.  PPS: 50%  Chief Complaint: Follow up palliative consult for complex medical decision making  HISTORY OF PRESENT ILLNESS:  Lydia King is a 87 y.o. year old female  with multiple medical problems including chronic HF (EF 55% in July 2021), CAD with 2 stents placed, OSA with CPAP use, Asthma, DM2, Osteopenia,Osteoarthritis multiple joints and history of CVA with cortical blindness. Ms Bradway continues to reside in [place] at [facility]. Ms Mesina is independent for her personal hygiene. She is ambulatory with her walker. No recent falls. Ms. Wamser feeds herself with good appetite. At present, Ms. Hoel is holding pressure to the side of her face due to a facial lesion protruding, actively bleeding. Wound care NP came in to assess also. Ms. Weed and I prior to Wound care nurse arrival talked about medical goc, reviewed treat is treatable. We talked about the lesion for which she has had for about a month. Ms. Bieri denies symptoms of pain. No fever. We talked about her  appetite which has not been affected by lesion. We talked about residing at facility briefly. Wound NP discussed with Ms. Kedzierski about going to ED due to uncontrolled bleeding, being on a blood thinner with concern for lesion being deeper than expected with limited resources at facility. Ms. Stifter in agreement to go to ED. Discussed with Ms. Huertas, will revisit goc, chronic  dz progression next pc visit, in agreement.   History obtained from review of EMR, discussion with facility staff and Ms. Rey.  I reviewed available labs, medications, imaging, studies and related documents from the EMR.  Records reviewed and summarized above.   ROS Full 14 system review of systems performed and negative with exception of: as per HPI.   Physical Exam: Constitutional: NAD General: frail appearing, pleasant female EYES: lids intact ENMT:oral mucous membranes moist CV: S1S2, RRR Pulmonary: LCTA, no increased work of breathing, no cough, room air Abdomen: intake 100% MSK: ambulatory Skin: warm and dry Neuro:  no generalized weakness,  no cognitive impairment Psych: non-anxious affect, A and O x 3  Questions and concerns were addressed.  Provided general support and encouragement, no other unmet needs identified   Thank you for the opportunity to participate in the care of Ms. Ferrall.  The palliative care team will continue to follow. Please call our office at [phone] if we can be of additional assistance.   This chart was dictated using voice recognition software.  Despite best efforts to proofread,  errors can occur which can change the documentation meaning.   Quita Mcgrory Ihor Gully, NP

## 2020-09-28 NOTE — ED Provider Notes (Signed)
[facility] Emergency Department Provider Note   ____________________________________________    I have reviewed the triage vital signs and the nursing notes.   HISTORY  Chief Complaint wound on face bleeding.      HPI Lydia King is a 87 y.o. female with history as noted below who presents with complaints of skin lesion that is bleeding.  Patient reports over the last month she has developed what she describes as a blood blister on her right cheek.  She notes initially it started off as a pimple and she tried to pop it but it then seemed to turn into a blood blister.  Today at her facility nurse practitioner attempted to drain it with a needle and was unable to contain the bleeding so sent her to the emergency department.  Past Medical History:  Diagnosis Date   (HFpEF) heart failure with preserved ejection fraction ([place])    a. 06/2019 Echo ([facility]): EF >55%, mild conc LVH. Mild-mod AS, mild MR/TR.   Anemia    in past (from blood loss at hiatal hernia)   Asthma    CAD (coronary artery disease)    a. 05/2019 MI/PCI: ost LCX (rota + cba-->3.0x16 Synergy DES), LAD 90 (3.0x16 Synergy DES).   Charcot-Marie-Tooth disease    walks with cane (fairly controlled)   Diverticulosis    DVT (deep venous thrombosis) ([place]) 2003   after sx   GERD (gastroesophageal reflux disease)    Grief reaction    on fluoxetine   Headache(784.0)    migraine   Hiatal hernia    Hyperlipidemia    Hypertension    OA (ocular albinism) ([place])    Old myocardial infarction    Osteopenia    PUD (peptic ulcer disease)    gastric, past   Shingles    Stroke ([place]) 8/10   posterior reversible encephalopathy syndrome   Urine incontinence     Patient Active Problem List   Diagnosis Date Noted   Pain due to onychomycosis of toenails of both feet 11/24/2019   Anxiety 11/09/2019   Chronic diastolic CHF (congestive heart failure) ([place]) 11/03/2019   Paraesophageal hiatal hernia     Chest pain 11/02/2019   CAD (coronary artery disease) 08/01/2019   Anemia 08/01/2019   Acute on chronic congestive heart failure ([place]) 07/31/2019   Acute decompensated heart failure ([place]) 07/03/2019   Bradycardia 06/17/2019   Hypoalbuminemia 06/17/2019   Severe protein-calorie malnutrition ([place]) 06/17/2019   Aortic valve stenosis 06/15/2019   Mitral valve regurgitation 06/15/2019   Positive cardiac stress test 06/13/2019   Closed fracture of right distal femur ([place]) 03/15/2019   Periprosthetic fracture around internal prosthetic right knee joint 03/15/2019   Hyperlipidemia associated with type 2 diabetes mellitus ([place]) 07/27/2018   Sinus congestion 02/05/2018   Vertigo 05/06/2017   OSA (obstructive sleep apnea) 01/02/2016   Pedal edema 01/02/2016   Fall in home 12/11/2015   Poor balance 12/11/2015   Low back pain with sciatica 10/02/2015   Right hip pain 10/02/2015   Hearing loss 04/18/2015   Encounter for Medicare annual wellness exam 10/04/2013   Type 2 diabetes mellitus without complications ([place]) [id number]   History of CVA (cerebrovascular accident) 09/12/2008   Osteoarthritis of multiple joints 05/30/2008   Osteoporosis 05/30/2008   Vitamin D deficiency 07/14/2007   Sleep apnea 07/14/2007   Essential hypertension 11/30/2006   Asthma 11/30/2006   H/O gastroesophageal reflux (GERD) 11/30/2006   G I BLEED 11/30/2006   DEEP VENOUS THROMBOPHLEBITIS,  HX OF 11/30/2006    Past Surgical History:  Procedure Laterality Date   carotid dopplers  8/10   COLONOSCOPY  2002   HERNIA REPAIR  2002   bleeding   HIATAL HERNIA REPAIR  2000   needed blood transfusion   Stroke  8/10   hosp stroke/ posterior (vs posterior reversible encephalopathy syndrome) - d/c on Plavix   TEE normal  [id number]   no emoblic source   TOTAL KNEE ARTHROPLASTY  03,06   Bilateral   VESICOVAGINAL FISTULA CLOSURE W/ TAH  1964   with bleeding and cervical dysplasia    Prior to Admission medications    Medication Sig Start Date End Date Taking? Authorizing Provider  acetaminophen (TYLENOL) 500 MG tablet Take 2 tablets (1,000 mg total) by mouth in the morning, at noon, and at bedtime. 11/04/19   Fritzi Mandes, MD  alum & mag hydroxide-simeth (MAALOX/MYLANTA) 200-200-20 MG/5ML suspension Take 30 mLs by mouth in the morning, at noon, and at bedtime.    [provider]  aspirin EC 81 MG EC tablet Take 1 tablet (81 mg total) by mouth daily. Swallow whole. 08/05/19   Bonnielee Haff, MD  Blood Glucose Monitoring Suppl (FREESTYLE LITE) DEVI Use to check blood glucose daily and as needed for diabetes 10/11/17   Tower, Wynelle Fanny, MD  Calcium Carbonate-Vitamin D 600-400 MG-UNIT tablet Take 1 tablet by mouth daily with breakfast.     [provider]  Capsaicin-Menthol (SALONPAS PAIN REL GEL-PTCH HOT) 0.025-1.25 % PTCH Apply topically.    [provider]  carvedilol (COREG) 6.25 MG tablet Take 1 tablet (6.25 mg total) by mouth 2 (two) times daily with a meal. 11/04/19   Fritzi Mandes, MD  Cholecalciferol (VITAMIN D) 2000 UNITS CAPS Take 2 capsules by mouth daily.    [provider]  cloNIDine (CATAPRES) 0.1 MG tablet Take 0.1 mg by mouth 3 (three) times daily as needed.    [provider]  clopidogrel (PLAVIX) 75 MG tablet TAKE 1 TABLET BY MOUTH EVERY DAY 07/26/19   Tower, Wynelle Fanny, MD  cyanocobalamin 1000 MCG tablet Take 1,000 mcg by mouth daily.    [provider]  dexlansoprazole (DEXILANT) 60 MG capsule Take 1 capsule (60 mg total) by mouth daily. Patient not taking: Reported on 09/17/2020 07/20/20   Jonathon Bellows, MD  diclofenac Sodium (VOLTAREN) 1 % GEL Apply 2 g topically 2 (two) times daily. 03/28/20   [provider]  EPINEPHrine 0.3 mg/0.3 mL IJ SOAJ injection Inject 0.3 mg into the muscle as needed for anaphylaxis.    [provider]  esomeprazole (NEXIUM) 40 MG capsule Take 40 mg by mouth daily at 12 noon. 07/17/20   [provider]   famotidine (PEPCID) 40 MG tablet Take 1 tablet (40 mg total) by mouth at bedtime. Patient not taking: Reported on 09/17/2020 07/20/20   Jonathon Bellows, MD  Flaxseed, Linseed, (FLAX SEED OIL) 1000 MG CAPS Take 1 capsule by mouth daily.    [provider]  FLUoxetine (PROZAC) 20 MG capsule Take 20 mg by mouth daily. 11/20/19   [provider]  furosemide (LASIX) 40 MG tablet Take 1 tablet (40 mg total) by mouth daily. 11/09/19   Tower, [place] A, MD  glucose blood (FREESTYLE LITE) test strip Use to check blood sugar once daily for DM (dx. [id number]) 11/08/18   Tower, Wynelle Fanny, MD  isosorbide mononitrate (IMDUR) 30 MG 24 hr tablet Take 1 tablet (30 mg total) by mouth 2 (two) times  daily. 11/04/19 09/17/20  Fritzi Mandes, MD  Lancets (FREESTYLE) lancets Use as instructedUse to check blood sugar once daily for DM (dx. E11.618) 11/08/18   Tower, Wynelle Fanny, MD  lidocaine (LMX) 4 % cream Apply 1 application topically in the morning and at bedtime. To low back and shoulder areas 11/11/19   Tower, [place] A, MD  lisinopril (ZESTRIL) 20 MG tablet Take 40 mg by mouth daily.    [provider]  loperamide (IMODIUM) 2 MG capsule Take 2 mg by mouth as needed for diarrhea or loose stools. Patient not taking: Reported on 09/17/2020    [provider]  loratadine (CLARITIN) 10 MG tablet TAKE 1 TABLET BY MOUTH EVERY DAY 02/28/19   Tower, Wynelle Fanny, MD  melatonin 3 MG TABS tablet Take 3 mg by mouth at bedtime. Patient not taking: Reported on 09/17/2020    [provider]  meloxicam (MOBIC) 7.5 MG tablet Take 7.5 mg by mouth daily.    [provider]  Menthol-Methyl Salicylate (SALONPAS PAIN RELIEF PATCH EX) Apply 1 patch topically daily.    [provider]  metFORMIN (GLUCOPHAGE XR) 500 MG 24 hr tablet Take 1 tablet (500 mg total) by mouth daily with breakfast. 11/04/19 11/03/20  Fritzi Mandes, MD  Multiple Vitamins-Minerals (PRESERVISION AREDS 2) CAPS Give one pill by mouth bid  11/09/19   Tower, Wynelle Fanny, MD  nitroGLYCERIN (NITROSTAT) 0.4 MG SL tablet Place under the tongue. Patient not taking: Reported on 09/17/2020 06/19/19 07/17/20  [provider]  omega-3 acid ethyl esters (LOVAZA) 1 g capsule TAKE 1 CAPSULE (1 G TOTAL) BY MOUTH DAILY. 07/26/19   Tower, Wynelle Fanny, MD  ondansetron (ZOFRAN) 4 MG tablet Take 4 mg by mouth every 4 (four) hours as needed for nausea or vomiting. Patient not taking: Reported on 09/17/2020    [provider]  polyethylene glycol (MIRALAX / GLYCOLAX) 17 g packet Take 17 g by mouth daily. Patient not taking: Reported on 09/17/2020 11/11/19   Tower, Wynelle Fanny, MD  PROAIR HFA 108 [phone] Base) MCG/ACT inhaler INHALE 2 PUFFS INTO THE LUNGS EVERY 4 (FOUR) HOURS AS NEEDED FOR WHEEZING OR SHORTNESS OF BREATH. 03/05/16   Tower, Wynelle Fanny, MD  rosuvastatin (CRESTOR) 20 MG tablet TAKE 1 TABLET BY MOUTH EVERYDAY AT BEDTIME 11/04/19   Fritzi Mandes, MD  sennosides-docusate sodium (SENOKOT-S) 8.6-50 MG tablet Take 1 tablet by mouth daily. Patient not taking: Reported on 09/17/2020    [provider]  traMADol (ULTRAM) 50 MG tablet Take 50 mg by mouth 4 (four) times daily as needed. Patient not taking: Reported on 09/17/2020 05/24/20   [provider]  trolamine salicylate (ASPERCREME/ALOE) 10 % cream Apply 1 application topically daily. To shoulder areas 11/11/19   Tower, Wynelle Fanny, MD     Allergies Morphine and related, Other, Shrimp [shellfish allergy], Amlodipine, Amlodipine besylate, Atorvastatin, Ciprofloxacin, Influenza vaccines, Iodinated diagnostic agents, Nitrofurantoin, Omeprazole, Pantoprazole sodium, Pantoprazole sodium, Penicillin g, Penicillins, Simvastatin, Sulfa antibiotics, Sulfonamide derivatives, Codeine, and Prednisone  Family History  Problem Relation Age of Onset   Cerebral aneurysm Son    Cerebral aneurysm Father    Hyperlipidemia Father    Alcohol abuse Father    Stroke Mother    Cervical cancer Sister     Hyperlipidemia Brother    Alcohol abuse Brother    Stroke Brother    Diabetes Brother    Diabetes Sister    Sarcoidosis Sister    Other Sister  OP/ Broken hip   Other Other        whole family has charcot marie tooth   Ovarian cancer Sister     Social History Social History   Tobacco Use   Smoking status: Never   Smokeless tobacco: Never  Vaping Use   Vaping Use: Never used  Substance Use Topics   Alcohol use: No   Drug use: No    Review of Systems  Constitutional: No fever/chills     Gastrointestinal: No abdominal pain.  No nausea, no vomiting.    Skin: As above Neurological: Negative for headaches     ____________________________________________   PHYSICAL EXAM:  VITAL SIGNS: ED Triage Vitals  Enc Vitals Group     BP 09/28/20 1404 (!) 173/82     Pulse Rate 09/28/20 1404 60     Resp 09/28/20 1404 16     Temp 09/28/20 1404 98.4 F (36.9 C)     Temp src --      SpO2 09/28/20 1404 95 %     Weight 09/28/20 1405 64.4 kg (142 lb)     Height 09/28/20 1405 1.473 m ('4\' 10"'$ )     Head Circumference --      Peak Flow --      Pain Score 09/28/20 1405 0     Pain Loc --      Pain Edu? --      Excl. in [place]? --      Constitutional: Alert and oriented. No acute distress. Pleasant and interactive Eyes: Conjunctivae are normal.  Head: Atraumatic. Nose: No congestion/rhinnorhea. Face: Vascular appearing lesion along the angle of the jaw on the right, bleeding is controlled now, no pulsatility, no surrounding erythema, not consistent with abscess Cardiovascular: Normal rate, regular rhythm.  Respiratory: Normal respiratory effort.  No retractions.  Skin:  Skin is warm, dry and intact.  See above   ____________________________________________   LABS (all labs ordered are listed, but only abnormal results are displayed)  Labs Reviewed - No data to  display ____________________________________________  EKG   ____________________________________________  RADIOLOGY  None ____________________________________________   PROCEDURES  Procedure(s) performed: No  Procedures   Critical Care performed: No ____________________________________________   INITIAL IMPRESSION / ASSESSMENT AND PLAN / ED COURSE  Pertinent labs & imaging results that were available during my care of the patient were reviewed by me and considered in my medical decision making (see chart for details).   Patient with lesion on the face as described above, appears to be highly vascular, not consistent with infection abscess.  Unclear cause of growth, patient will need outpatient biopsy/follow-up with dermatology.  Bleeding controlled here   ____________________________________________   FINAL CLINICAL IMPRESSION(S) / ED DIAGNOSES  Final diagnoses:  Facial skin lesion      NEW MEDICATIONS STARTED DURING THIS VISIT:  New Prescriptions   No medications on file     Note:  This document was prepared using Dragon voice recognition software and may include unintentional dictation errors.    Lavonia Drafts, MD 09/28/20 [phone]

## 2020-10-02 ENCOUNTER — Encounter: Payer: Self-pay | Admitting: Family Medicine

## 2020-10-02 DIAGNOSIS — R52 Pain, unspecified: Secondary | ICD-10-CM | POA: Diagnosis not present

## 2020-10-02 DIAGNOSIS — R635 Abnormal weight gain: Secondary | ICD-10-CM | POA: Diagnosis not present

## 2020-10-02 DIAGNOSIS — I509 Heart failure, unspecified: Secondary | ICD-10-CM | POA: Diagnosis not present

## 2020-10-02 DIAGNOSIS — K449 Diaphragmatic hernia without obstruction or gangrene: Secondary | ICD-10-CM

## 2020-10-02 DIAGNOSIS — S01401D Unspecified open wound of right cheek and temporomandibular area, subsequent encounter: Secondary | ICD-10-CM | POA: Diagnosis not present

## 2020-10-02 DIAGNOSIS — R1319 Other dysphagia: Secondary | ICD-10-CM

## 2020-10-02 DIAGNOSIS — R079 Chest pain, unspecified: Secondary | ICD-10-CM

## 2020-10-02 DIAGNOSIS — R0602 Shortness of breath: Secondary | ICD-10-CM | POA: Diagnosis not present

## 2020-10-02 DIAGNOSIS — R072 Precordial pain: Secondary | ICD-10-CM

## 2020-10-03 DIAGNOSIS — J9611 Chronic respiratory failure with hypoxia: Secondary | ICD-10-CM | POA: Diagnosis not present

## 2020-10-03 DIAGNOSIS — R0602 Shortness of breath: Secondary | ICD-10-CM | POA: Diagnosis not present

## 2020-10-08 ENCOUNTER — Other Ambulatory Visit (INDEPENDENT_AMBULATORY_CARE_PROVIDER_SITE_OTHER): Payer: Medicare Other

## 2020-10-08 ENCOUNTER — Other Ambulatory Visit: Payer: PRIVATE HEALTH INSURANCE

## 2020-10-08 ENCOUNTER — Other Ambulatory Visit: Payer: Self-pay

## 2020-10-08 DIAGNOSIS — R1319 Other dysphagia: Secondary | ICD-10-CM | POA: Diagnosis not present

## 2020-10-08 DIAGNOSIS — R079 Chest pain, unspecified: Secondary | ICD-10-CM | POA: Diagnosis not present

## 2020-10-08 DIAGNOSIS — R1310 Dysphagia, unspecified: Secondary | ICD-10-CM | POA: Insufficient documentation

## 2020-10-08 LAB — BASIC METABOLIC PANEL
BUN: 26 mg/dL — ABNORMAL HIGH (ref 6–23)
CO2: 31 mEq/L (ref 19–32)
Calcium: 8.9 mg/dL (ref 8.4–10.5)
Chloride: 93 mEq/L — ABNORMAL LOW (ref 96–112)
Creatinine, Ser: 0.78 mg/dL (ref 0.40–1.20)
GFR: 67.95 mL/min (ref >60.00)
Glucose, Bld: 231 mg/dL — ABNORMAL HIGH (ref 70–99)
Potassium: 4.1 mEq/L (ref 3.5–5.1)
Sodium: 131 mEq/L — ABNORMAL LOW (ref 135–145)

## 2020-10-08 NOTE — Telephone Encounter (Signed)
Order in -thanks !

## 2020-10-08 NOTE — Telephone Encounter (Signed)
Lydia King, these are the tests that pt needs to be seen by a surgeon for her hiatal hernia   She lives in [place] but unsure which center she prefers so I ordered external Let me know if I have not ordered the tests properly  See the mychart message re: need for orders   I hope insurance pays, we will see

## 2020-10-08 NOTE — Telephone Encounter (Signed)
Please schedule patient stat labs.  At [facility]. Thanks!

## 2020-10-08 NOTE — Telephone Encounter (Signed)
Orders are correct Patient does however need BMET/CMET prior to scan.  She will have to have these done STAT at [facility] - order must be placed for [facility] lab and patient must be scheduled on their lab schedule.

## 2020-10-10 DIAGNOSIS — R58 Hemorrhage, not elsewhere classified: Secondary | ICD-10-CM | POA: Diagnosis not present

## 2020-10-10 DIAGNOSIS — H04123 Dry eye syndrome of bilateral lacrimal glands: Secondary | ICD-10-CM | POA: Diagnosis not present

## 2020-10-10 DIAGNOSIS — E119 Type 2 diabetes mellitus without complications: Secondary | ICD-10-CM | POA: Diagnosis not present

## 2020-10-10 DIAGNOSIS — X32XXXA Exposure to sunlight, initial encounter: Secondary | ICD-10-CM | POA: Diagnosis not present

## 2020-10-10 DIAGNOSIS — D485 Neoplasm of uncertain behavior of skin: Secondary | ICD-10-CM | POA: Diagnosis not present

## 2020-10-10 DIAGNOSIS — L980 Pyogenic granuloma: Secondary | ICD-10-CM | POA: Diagnosis not present

## 2020-10-10 DIAGNOSIS — H25813 Combined forms of age-related cataract, bilateral: Secondary | ICD-10-CM | POA: Diagnosis not present

## 2020-10-10 DIAGNOSIS — L570 Actinic keratosis: Secondary | ICD-10-CM | POA: Diagnosis not present

## 2020-10-10 DIAGNOSIS — H353121 Nonexudative age-related macular degeneration, left eye, early dry stage: Secondary | ICD-10-CM | POA: Diagnosis not present

## 2020-10-17 DIAGNOSIS — D2262 Melanocytic nevi of left upper limb, including shoulder: Secondary | ICD-10-CM | POA: Diagnosis not present

## 2020-10-17 DIAGNOSIS — D2271 Melanocytic nevi of right lower limb, including hip: Secondary | ICD-10-CM | POA: Diagnosis not present

## 2020-10-17 DIAGNOSIS — D2272 Melanocytic nevi of left lower limb, including hip: Secondary | ICD-10-CM | POA: Diagnosis not present

## 2020-10-17 DIAGNOSIS — L298 Other pruritus: Secondary | ICD-10-CM | POA: Diagnosis not present

## 2020-10-17 DIAGNOSIS — L820 Inflamed seborrheic keratosis: Secondary | ICD-10-CM | POA: Diagnosis not present

## 2020-10-17 DIAGNOSIS — D2261 Melanocytic nevi of right upper limb, including shoulder: Secondary | ICD-10-CM | POA: Diagnosis not present

## 2020-10-17 DIAGNOSIS — L821 Other seborrheic keratosis: Secondary | ICD-10-CM | POA: Diagnosis not present

## 2020-10-17 DIAGNOSIS — D225 Melanocytic nevi of trunk: Secondary | ICD-10-CM | POA: Diagnosis not present

## 2020-11-06 ENCOUNTER — Ambulatory Visit
Admission: RE | Admit: 2020-11-06 | Discharge: 2020-11-06 | Disposition: A | Discharge status: Home / Self Care | Payer: Medicare Other | Source: Ambulatory Visit | Attending: Family Medicine | Admitting: Family Medicine

## 2020-11-06 ENCOUNTER — Other Ambulatory Visit: Payer: Self-pay

## 2020-11-06 DIAGNOSIS — R072 Precordial pain: Secondary | ICD-10-CM

## 2020-11-06 DIAGNOSIS — K449 Diaphragmatic hernia without obstruction or gangrene: Secondary | ICD-10-CM

## 2020-11-06 DIAGNOSIS — R079 Chest pain, unspecified: Secondary | ICD-10-CM | POA: Diagnosis not present

## 2020-11-06 DIAGNOSIS — R1319 Other dysphagia: Secondary | ICD-10-CM | POA: Insufficient documentation

## 2020-11-06 DIAGNOSIS — K224 Dyskinesia of esophagus: Secondary | ICD-10-CM | POA: Diagnosis not present

## 2020-11-13 ENCOUNTER — Ambulatory Visit: Payer: Medicare Other

## 2020-11-15 ENCOUNTER — Other Ambulatory Visit: Payer: Self-pay

## 2020-11-15 ENCOUNTER — Ambulatory Visit
Admission: RE | Admit: 2020-11-15 | Discharge: 2020-11-15 | Disposition: A | Discharge status: Home / Self Care | Payer: Medicare Other | Source: Ambulatory Visit | Attending: Family Medicine | Admitting: Family Medicine

## 2020-11-15 DIAGNOSIS — R079 Chest pain, unspecified: Secondary | ICD-10-CM | POA: Insufficient documentation

## 2020-11-15 DIAGNOSIS — I700 Atherosclerosis of aorta: Secondary | ICD-10-CM | POA: Diagnosis not present

## 2020-11-15 DIAGNOSIS — R1310 Dysphagia, unspecified: Secondary | ICD-10-CM | POA: Diagnosis not present

## 2020-11-15 DIAGNOSIS — R072 Precordial pain: Secondary | ICD-10-CM | POA: Insufficient documentation

## 2020-11-15 DIAGNOSIS — K449 Diaphragmatic hernia without obstruction or gangrene: Secondary | ICD-10-CM | POA: Diagnosis not present

## 2020-11-15 DIAGNOSIS — K429 Umbilical hernia without obstruction or gangrene: Secondary | ICD-10-CM | POA: Diagnosis not present

## 2020-11-15 DIAGNOSIS — R1319 Other dysphagia: Secondary | ICD-10-CM | POA: Diagnosis not present

## 2020-11-15 LAB — POCT I-STAT CREATININE: Creatinine, Ser: 0.7 mg/dL (ref 0.44–1.00)

## 2020-11-15 MED ORDER — IOHEXOL 300 MG/ML  SOLN
100.0000 mL | Freq: Once | INTRAMUSCULAR | Status: AC | PRN
  Administered 2020-11-15: 100 mL via INTRAVENOUS

## 2020-11-19 DIAGNOSIS — H2511 Age-related nuclear cataract, right eye: Secondary | ICD-10-CM | POA: Diagnosis not present

## 2020-11-20 DIAGNOSIS — E1151 Type 2 diabetes mellitus with diabetic peripheral angiopathy without gangrene: Secondary | ICD-10-CM | POA: Diagnosis not present

## 2020-11-20 DIAGNOSIS — L603 Nail dystrophy: Secondary | ICD-10-CM | POA: Diagnosis not present

## 2020-11-20 DIAGNOSIS — H2512 Age-related nuclear cataract, left eye: Secondary | ICD-10-CM | POA: Diagnosis not present

## 2020-11-27 ENCOUNTER — Telehealth: Payer: Self-pay | Admitting: Family Medicine

## 2020-11-27 NOTE — Telephone Encounter (Signed)
Imaging was sent to Dr. Margart Sickles as PCP requested, please advise

## 2020-11-27 NOTE — Telephone Encounter (Signed)
Everything was sent so they will need to check with Dr Margart Sickles for the next steps/further plan

## 2020-11-27 NOTE — Telephone Encounter (Signed)
Pt daughter called stating that she has not received any follow-up information regarding test results of a ct scan of the chest and abdomen and swallow test from Dr. Margart Sickles.

## 2020-11-28 NOTE — Telephone Encounter (Signed)
Left Vm letting family know Dr. Marliss Coots comments

## 2020-11-30 DIAGNOSIS — R062 Wheezing: Secondary | ICD-10-CM | POA: Diagnosis not present

## 2020-11-30 DIAGNOSIS — U071 COVID-19: Secondary | ICD-10-CM | POA: Diagnosis not present

## 2020-12-17 DIAGNOSIS — H2512 Age-related nuclear cataract, left eye: Secondary | ICD-10-CM | POA: Diagnosis not present

## 2020-12-20 DIAGNOSIS — H25013 Cortical age-related cataract, bilateral: Secondary | ICD-10-CM | POA: Diagnosis not present

## 2020-12-22 DIAGNOSIS — R350 Frequency of micturition: Secondary | ICD-10-CM | POA: Diagnosis not present

## 2020-12-22 DIAGNOSIS — R39198 Other difficulties with micturition: Secondary | ICD-10-CM | POA: Diagnosis not present

## 2020-12-22 DIAGNOSIS — R309 Painful micturition, unspecified: Secondary | ICD-10-CM | POA: Diagnosis not present

## 2020-12-24 NOTE — Telephone Encounter (Signed)
Pt daughter called stating that Dr. Margart Sickles have received everything but the CT Scan. Pt daughter states that they need the CT Scan sent in as soon as possible.

## 2020-12-24 NOTE — Telephone Encounter (Signed)
CT sent

## 2020-12-27 DIAGNOSIS — R309 Painful micturition, unspecified: Secondary | ICD-10-CM | POA: Diagnosis not present

## 2020-12-31 ENCOUNTER — Non-Acute Institutional Stay: Payer: Medicare Other | Admitting: Nurse Practitioner

## 2020-12-31 ENCOUNTER — Encounter: Payer: Self-pay | Admitting: Nurse Practitioner

## 2020-12-31 ENCOUNTER — Other Ambulatory Visit: Payer: Self-pay

## 2020-12-31 VITALS — BP 142/84 | HR 65 | Temp 96.8°F | Resp 20 | Wt 137.1 lb

## 2020-12-31 DIAGNOSIS — Z515 Encounter for palliative care: Secondary | ICD-10-CM | POA: Diagnosis not present

## 2020-12-31 DIAGNOSIS — R0602 Shortness of breath: Secondary | ICD-10-CM | POA: Diagnosis not present

## 2020-12-31 DIAGNOSIS — I5032 Chronic diastolic (congestive) heart failure: Secondary | ICD-10-CM

## 2020-12-31 DIAGNOSIS — R600 Localized edema: Secondary | ICD-10-CM | POA: Diagnosis not present

## 2020-12-31 NOTE — Progress Notes (Signed)
[facility] Consult Note Telephone: [phone]  Fax: [phone]    Date of encounter: 12/31/20 11:31 AM PATIENT NAME: Lydia King Lydia King [id number]   [phone] (home)  DOB: Aug 12, 1930 MRN: [id number] PRIMARY CARE PROVIDER:   [place], MD,  [place] Lydia King [address] [phone] RESPONSIBLE PARTY:    Contact Information     Name Relation Home Work Lydia King Daughter [phone]  [phone]   [name] Daughter   [phone]      I met face to face with patient in facility. Palliative Care was asked to follow this patient by consultation request of  Lydia King to address advance care planning and complex medical decision making. This is a follow up visit.                                  ASSESSMENT AND PLAN / RECOMMENDATIONS:  Symptom Management/Plan: 1. Advance Care Planning; DNR in [place]; reviewed, wishes are to treat what is treatable, hospitalization if needed   2. Shortness of breath/edema stable secondary to CHF, reviewed weights, no edema. No recent exacerbation. Continue to elevate legs, compression hose   3. Palliative care encounter; Palliative care encounter; Palliative medicine team will continue to support patient, patient's family, and medical team. Visit consisted of counseling and education dealing with the complex and emotionally intense issues of symptom management and palliative care in the setting of serious and potentially life-threatening illness   4. f/u 8 weeks for ongoing monitoring str, progression, planning for d/c plans or transition to LTC with family, goc  Follow up Palliative Care Visit: Palliative care will continue to follow for complex medical decision making, advance care planning, and clarification of goals. Return 8 weeks or prn.  I spent 38 minutes providing this consultation.  More than 50% of the time in this consultation was spent in counseling and care coordination. PPS: 40% Chief Complaint: Follow up palliative consult for complex medical decision making  HISTORY OF PRESENT ILLNESS:  Lydia King is a 88 y.o. year old female  with multiple medical problems including chronic HF (EF 55% in July 2021), CAD with 2 stents placed, OSA with CPAP use, Asthma, DM2, Osteopenia,Osteoarthritis multiple joints and history of CVA with cortical blindness. [name] continues to reside in [place] at [facility] [name] is independent for her personal hygiene. She is ambulatory with her walker. No recent falls. Ms. Tessier feeds herself with good appetite. At present Ms. Klinker is lying in bed. Appears comfortable. No visitors present. [name] and I talked about PC visit. We talked about symptoms, how she has been feeling. We talked about currently Ms. Knebel is currently receiving abx for UTI but improving. We talked about appetite, nutrition. We talked about residing at facility, her roommate she is close with. We talked about the Christmas decoration in her room. Medical goals reviewed. I attempted to contact Ms. Opdahl daughter Lydia King, message left. Therapeutic listening, emotional support provided. Questions answered. Will continue current poc, [name] appears stable  History obtained from review of EMR, discussion with facility staff and  Ms. Mcneel.  I reviewed available labs, medications, imaging, studies and related documents from the EMR.  Records reviewed and summarized above.   ROS Full 10 system review of systems performed and  negative with exception of: as per HPI.   Physical Exam: Constitutional: NAD General: frail appearing, pleasant elderly female EYES: lids intact ENMT:  oral mucous membranes moist CV: S1S2, RRR, no LE edema Pulmonary: LCTA, no increased work of breathing, no  cough Abdomen:  normo-active BS + 4 quadrants, soft and non tender MSK: ambulatory with walker Skin: warm and dry Neuro:  + generalized weakness,  no cognitive impairment Psych: non-anxious affect, A and O x 3 Thank you for the opportunity to participate in the care of [name].  The palliative care team will continue to follow. Please call our office at [phone] if we can be of additional assistance.   Questions and concerns were addressed. Provided general support and encouragement, no other unmet needs identified   This chart was dictated using voice recognition software.  Despite best efforts to proofread,  errors can occur which can change the documentation meaning.   Beulah Capobianco Ihor Gully, NP

## 2021-01-01 DIAGNOSIS — E119 Type 2 diabetes mellitus without complications: Secondary | ICD-10-CM | POA: Diagnosis not present

## 2021-01-08 DIAGNOSIS — E785 Hyperlipidemia, unspecified: Secondary | ICD-10-CM | POA: Diagnosis not present

## 2021-01-08 DIAGNOSIS — R799 Abnormal finding of blood chemistry, unspecified: Secondary | ICD-10-CM | POA: Diagnosis not present

## 2021-01-08 DIAGNOSIS — E119 Type 2 diabetes mellitus without complications: Secondary | ICD-10-CM | POA: Diagnosis not present

## 2021-01-08 DIAGNOSIS — D649 Anemia, unspecified: Secondary | ICD-10-CM | POA: Diagnosis not present

## 2021-01-14 DIAGNOSIS — K449 Diaphragmatic hernia without obstruction or gangrene: Secondary | ICD-10-CM | POA: Diagnosis not present

## 2021-02-07 DIAGNOSIS — K219 Gastro-esophageal reflux disease without esophagitis: Secondary | ICD-10-CM | POA: Diagnosis not present

## 2021-02-07 DIAGNOSIS — E785 Hyperlipidemia, unspecified: Secondary | ICD-10-CM | POA: Diagnosis not present

## 2021-02-07 DIAGNOSIS — K449 Diaphragmatic hernia without obstruction or gangrene: Secondary | ICD-10-CM | POA: Diagnosis not present

## 2021-02-07 DIAGNOSIS — J069 Acute upper respiratory infection, unspecified: Secondary | ICD-10-CM | POA: Diagnosis not present

## 2021-02-12 DIAGNOSIS — L603 Nail dystrophy: Secondary | ICD-10-CM | POA: Diagnosis not present

## 2021-02-12 DIAGNOSIS — B351 Tinea unguium: Secondary | ICD-10-CM | POA: Diagnosis not present

## 2021-02-12 DIAGNOSIS — E1151 Type 2 diabetes mellitus with diabetic peripheral angiopathy without gangrene: Secondary | ICD-10-CM | POA: Diagnosis not present

## 2021-02-12 DIAGNOSIS — Z7984 Long term (current) use of oral hypoglycemic drugs: Secondary | ICD-10-CM | POA: Diagnosis not present

## 2021-02-13 DIAGNOSIS — M25511 Pain in right shoulder: Secondary | ICD-10-CM | POA: Diagnosis not present

## 2021-02-13 DIAGNOSIS — J329 Chronic sinusitis, unspecified: Secondary | ICD-10-CM | POA: Diagnosis not present

## 2021-02-13 DIAGNOSIS — M1990 Unspecified osteoarthritis, unspecified site: Secondary | ICD-10-CM | POA: Diagnosis not present

## 2021-02-13 DIAGNOSIS — J302 Other seasonal allergic rhinitis: Secondary | ICD-10-CM | POA: Diagnosis not present

## 2021-02-25 DIAGNOSIS — K1370 Unspecified lesions of oral mucosa: Secondary | ICD-10-CM | POA: Diagnosis not present

## 2021-02-25 DIAGNOSIS — J069 Acute upper respiratory infection, unspecified: Secondary | ICD-10-CM | POA: Diagnosis not present

## 2021-02-25 DIAGNOSIS — K219 Gastro-esophageal reflux disease without esophagitis: Secondary | ICD-10-CM | POA: Diagnosis not present

## 2021-02-25 DIAGNOSIS — E785 Hyperlipidemia, unspecified: Secondary | ICD-10-CM | POA: Diagnosis not present

## 2021-02-25 DIAGNOSIS — K449 Diaphragmatic hernia without obstruction or gangrene: Secondary | ICD-10-CM | POA: Diagnosis not present

## 2021-02-26 DIAGNOSIS — R52 Pain, unspecified: Secondary | ICD-10-CM | POA: Diagnosis not present

## 2021-02-28 DIAGNOSIS — F331 Major depressive disorder, recurrent, moderate: Secondary | ICD-10-CM | POA: Diagnosis not present

## 2021-02-28 DIAGNOSIS — F411 Generalized anxiety disorder: Secondary | ICD-10-CM | POA: Diagnosis not present

## 2021-02-28 DIAGNOSIS — F5105 Insomnia due to other mental disorder: Secondary | ICD-10-CM | POA: Diagnosis not present

## 2021-03-01 DIAGNOSIS — E119 Type 2 diabetes mellitus without complications: Secondary | ICD-10-CM | POA: Diagnosis not present

## 2021-03-01 DIAGNOSIS — I10 Essential (primary) hypertension: Secondary | ICD-10-CM | POA: Diagnosis not present

## 2021-03-02 DIAGNOSIS — N390 Urinary tract infection, site not specified: Secondary | ICD-10-CM | POA: Diagnosis not present

## 2021-03-04 ENCOUNTER — Non-Acute Institutional Stay: Payer: Medicare Other | Admitting: Nurse Practitioner

## 2021-03-04 ENCOUNTER — Encounter: Payer: Self-pay | Admitting: Nurse Practitioner

## 2021-03-04 ENCOUNTER — Other Ambulatory Visit: Payer: Self-pay

## 2021-03-04 VITALS — BP 134/64 | HR 82 | Temp 96.8°F | Resp 18 | Wt 137.1 lb

## 2021-03-04 DIAGNOSIS — R531 Weakness: Secondary | ICD-10-CM

## 2021-03-04 DIAGNOSIS — Z515 Encounter for palliative care: Secondary | ICD-10-CM

## 2021-03-04 NOTE — Progress Notes (Addendum)
° ° °[facility] °Community Palliative Care Consult Note °Telephone: [phone]  °Fax: [phone]  ° ° °Date of encounter: 03/04/21 °8:47 PM °PATIENT NAME: Lydia King °[address] °[place] [address]   °[phone] (home)  °DOB: 02/28/1930 °MRN: [id number] °PRIMARY CARE PROVIDER:    °[facility] ° °RESPONSIBLE PARTY:    °Contact Information   ° ° Name Relation Home Work Mobile  ° McCall,Gale Daughter [phone]  [phone]  ° Adkins, Pat Daughter   [phone]  ° °  ° °I met face to face with patient in facility. Palliative Care was asked to follow this patient by consultation request of  [facility] to address advance care planning and complex medical decision making. This is a follow up visit.                                  °ASSESSMENT AND PLAN / RECOMMENDATIONS:  °Symptom Management/Plan: °1. Advance Care Planning; DNR in Vyn[place]; reviewed, wishes are to treat what is treatable, hospitalization if needed °  °2. generalized weakness secondary to UTI, continue abx, increase fluids, rest, monitor for fever, energy conservation °  °3. Palliative care encounter; Palliative care encounter; Palliative medicine team will continue to support patient, patient's family, and medical team. Visit consisted of counseling and education dealing with the complex and emotionally intense issues of symptom management and palliative care in the setting of serious and potentially life-threatening illness °  °4. f/u 8 weeks for ongoing monitoring str, progression, planning for d/c plans or transition to LTC with family, goc ° °Follow up Palliative Care Visit: Palliative care will continue to follow for complex medical decision making, advance care planning, and clarification of goals. Return 8 weeks or prn. ° °I spent 66 minutes providing this consultation. More than 50% of the time in this consultation was spent in counseling and care coordination. °PPS: 50% ° °Chief  Complaint: Follow up palliative consult for complex medical decision making ° °HISTORY OF PRESENT ILLNESS:  Lydia King is a 88 y.o. year old female  with multiple medical problems including chronic HF (EF 55% in July 2021), CAD with 2 stents placed, OSA with CPAP use, Asthma, DM2, Osteopenia,Osteoarthritis multiple joints and history of CVA with cortical blindness. Lydia King continues to reside in [facility]. Lydia King is independent for her personal hygiene. She is ambulatory with her walker. No recent falls. Lydia. Tomasetti feeds herself with good appetite. At present Lydia King is lying in bed. Appears comfortable. No visitors present. Lydia King and I talked about PC visit. We talked about symptoms, how she has been feeling. We talked about currently Lydia King is currently receiving abx for UTI but improving. We talked about appetite, nutrition, ros. We talked about residing at facility, her roommate she is close with. We talked about the birds she and her room-mate watch outside their window. No recent falls, hospitalizations, wounds. Medical goals reviewed. We talked about quality of life, facility, medical goals. Lydia. Husak was interactive, talked about the book she has been reading. I attempted to contact Lydia King daughter Lydia King, message left. Therapeutic listening, emotional support provided. Questions answered. Will continue current poc, Lydia King appears stable ° °History obtained from review of EMR, discussion with facility staff and Lydia. Stavros.  °I reviewed available labs, medications, imaging, studies and related documents from the EMR.  Records reviewed   and summarized above.  ° °ROS °10 point system reviewed all negative except HPI ° °Physical Exam: °Constitutional: NAD °General: frail appearing, thin, pleasant female °EYES: lids intact °ENMT: oral mucous membranes moist °CV: S1S2, RRR, no LE  edema °Pulmonary: LCTA, no increased work of breathing, no cough, room air °Abdomen:  normo-active BS + 4 quadrants, soft and non tender °MSK: ambulatory with walker °Skin: warm and dry °Neuro:  no generalized weakness,  no cognitive impairment °Psych: non-anxious affect, A and O x 3 °Thank you for the opportunity to participate in the care of Lydia. King.  The palliative care team will continue to follow. Please call our office at [phone] if we can be of additional assistance.  ° ° Z , NP  °  °

## 2021-03-05 ENCOUNTER — Telehealth: Payer: Self-pay | Admitting: Cardiovascular Disease

## 2021-03-05 NOTE — Telephone Encounter (Signed)
° °  Pre-operative Risk Assessment    Patient Name: Lydia King  DOB: 08/06/1930 MRN: [id number]      Request for Surgical Clearance    Procedure:   EGD  Date of Surgery:  Clearance TBD                                 Surgeon:  not indicated  Surgeon's Group or Practice Name:  [facility] Phone number:  [phone] Fax number:  [phone]   Type of Clearance Requested:   - Pharmacy:  Hold Clopidogrel (Plavix) hold x 5 days   Type of Anesthesia:  Not Indicated   Additional requests/questions:    Manfred Arch   03/05/2021, 4:05 PM

## 2021-03-06 NOTE — Telephone Encounter (Signed)
° °  Name: Lydia King  DOB: May 16, 1930  MRN: [id number]   Primary Cardiologist: Ida Rogue, MD  Chart reviewed as part of pre-operative protocol coverage. Patient was contacted 03/06/2021 in reference to pre-operative risk assessment for pending surgery as outlined below.  Lydia King was last seen by Dr. Rockey Situ 08/2020 with hx of 3V CAD s/p prior stenting to LAD and Cx in 05/2019 at [facility], aortic stenosis, HFpEF, DM amongst other medical history. Per his note at the time:  "Large hiatal hernia Having worsening symptoms Recommend she talk with specialists at [facility] Dr. Margart Sickles and his associates From cardiac perspective would be acceptable risk for procedure Would be able to stop the Plavix for surgery She feels that she is reached a point where she has to have the surgery and can no longer persevere with pain medications alone"  I left a message to verify no new cardiac symptoms. Got VM. LMTCB. Daughter is listed on DPR.  Charlie Pitter, PA-C 03/06/2021, 11:08 AM

## 2021-03-13 NOTE — Telephone Encounter (Signed)
Tried to call pt again, got VM. LMTCB. Will try to send MyChart message as well.

## 2021-03-15 NOTE — Telephone Encounter (Signed)
LMTCB 03/15/2021.

## 2021-03-19 NOTE — Telephone Encounter (Signed)
° °  Patient Name: Lydia King  DOB: 1930/02/29 MRN: [id number]  Primary Cardiologist: Ida Rogue, MD  Chart reviewed as part of pre-operative protocol coverage.  At this juncture we have tried to call the patient multiple times since clearance request was entered on 03/05/21 and she has not returned calls. This number is also her daughter's number. We also sent a Mychart message without reply back. I will route to requesting surgeon to let them know we were unable to establish contact with the patient to provide cardiac clearance -> if you speak with patient, please have her give our office a call back at [phone] and ask to speak with the preop team. Otherwise we will remove this clearance request from the preop box at this time.   Charlie Pitter, PA-C 03/19/2021, 9:25 AM

## 2021-03-28 DIAGNOSIS — F331 Major depressive disorder, recurrent, moderate: Secondary | ICD-10-CM | POA: Diagnosis not present

## 2021-03-28 DIAGNOSIS — F5105 Insomnia due to other mental disorder: Secondary | ICD-10-CM | POA: Diagnosis not present

## 2021-03-28 DIAGNOSIS — F411 Generalized anxiety disorder: Secondary | ICD-10-CM | POA: Diagnosis not present

## 2021-04-01 DIAGNOSIS — K449 Diaphragmatic hernia without obstruction or gangrene: Secondary | ICD-10-CM | POA: Diagnosis not present

## 2021-04-01 DIAGNOSIS — K219 Gastro-esophageal reflux disease without esophagitis: Secondary | ICD-10-CM | POA: Diagnosis not present

## 2021-04-04 DIAGNOSIS — M25511 Pain in right shoulder: Secondary | ICD-10-CM | POA: Diagnosis not present

## 2021-04-04 DIAGNOSIS — M1990 Unspecified osteoarthritis, unspecified site: Secondary | ICD-10-CM | POA: Diagnosis not present

## 2021-04-06 DIAGNOSIS — N390 Urinary tract infection, site not specified: Secondary | ICD-10-CM | POA: Diagnosis not present

## 2021-04-08 DIAGNOSIS — I10 Essential (primary) hypertension: Secondary | ICD-10-CM | POA: Diagnosis not present

## 2021-04-08 DIAGNOSIS — B3731 Acute candidiasis of vulva and vagina: Secondary | ICD-10-CM | POA: Diagnosis not present

## 2021-04-08 DIAGNOSIS — R300 Dysuria: Secondary | ICD-10-CM | POA: Diagnosis not present

## 2021-04-09 DIAGNOSIS — B3731 Acute candidiasis of vulva and vagina: Secondary | ICD-10-CM | POA: Diagnosis not present

## 2021-04-09 DIAGNOSIS — I10 Essential (primary) hypertension: Secondary | ICD-10-CM | POA: Diagnosis not present

## 2021-04-10 DIAGNOSIS — E119 Type 2 diabetes mellitus without complications: Secondary | ICD-10-CM | POA: Diagnosis not present

## 2021-04-10 DIAGNOSIS — M19012 Primary osteoarthritis, left shoulder: Secondary | ICD-10-CM | POA: Diagnosis not present

## 2021-04-10 DIAGNOSIS — M19011 Primary osteoarthritis, right shoulder: Secondary | ICD-10-CM | POA: Diagnosis not present

## 2021-04-15 DIAGNOSIS — E119 Type 2 diabetes mellitus without complications: Secondary | ICD-10-CM | POA: Diagnosis not present

## 2021-04-17 DIAGNOSIS — I10 Essential (primary) hypertension: Secondary | ICD-10-CM | POA: Diagnosis not present

## 2021-04-17 DIAGNOSIS — R110 Nausea: Secondary | ICD-10-CM | POA: Diagnosis not present

## 2021-04-24 DIAGNOSIS — M19011 Primary osteoarthritis, right shoulder: Secondary | ICD-10-CM | POA: Diagnosis not present

## 2021-04-24 DIAGNOSIS — M19012 Primary osteoarthritis, left shoulder: Secondary | ICD-10-CM | POA: Diagnosis not present

## 2021-04-24 DIAGNOSIS — F411 Generalized anxiety disorder: Secondary | ICD-10-CM | POA: Diagnosis not present

## 2021-04-24 DIAGNOSIS — M47896 Other spondylosis, lumbar region: Secondary | ICD-10-CM | POA: Diagnosis not present

## 2021-04-24 DIAGNOSIS — F331 Major depressive disorder, recurrent, moderate: Secondary | ICD-10-CM | POA: Diagnosis not present

## 2021-04-24 DIAGNOSIS — M533 Sacrococcygeal disorders, not elsewhere classified: Secondary | ICD-10-CM | POA: Diagnosis not present

## 2021-04-25 DIAGNOSIS — M1990 Unspecified osteoarthritis, unspecified site: Secondary | ICD-10-CM | POA: Diagnosis not present

## 2021-04-25 DIAGNOSIS — F5105 Insomnia due to other mental disorder: Secondary | ICD-10-CM | POA: Diagnosis not present

## 2021-04-25 DIAGNOSIS — F411 Generalized anxiety disorder: Secondary | ICD-10-CM | POA: Diagnosis not present

## 2021-04-25 DIAGNOSIS — M4150 Other secondary scoliosis, site unspecified: Secondary | ICD-10-CM | POA: Diagnosis not present

## 2021-04-25 DIAGNOSIS — F331 Major depressive disorder, recurrent, moderate: Secondary | ICD-10-CM | POA: Diagnosis not present

## 2021-04-25 DIAGNOSIS — M545 Low back pain, unspecified: Secondary | ICD-10-CM | POA: Diagnosis not present

## 2021-04-29 DIAGNOSIS — E119 Type 2 diabetes mellitus without complications: Secondary | ICD-10-CM | POA: Diagnosis not present

## 2021-04-29 DIAGNOSIS — Z961 Presence of intraocular lens: Secondary | ICD-10-CM | POA: Diagnosis not present

## 2021-05-01 DIAGNOSIS — M533 Sacrococcygeal disorders, not elsewhere classified: Secondary | ICD-10-CM | POA: Diagnosis not present

## 2021-05-01 DIAGNOSIS — M25811 Other specified joint disorders, right shoulder: Secondary | ICD-10-CM | POA: Diagnosis not present

## 2021-05-02 DIAGNOSIS — M545 Low back pain, unspecified: Secondary | ICD-10-CM | POA: Diagnosis not present

## 2021-05-02 DIAGNOSIS — I10 Essential (primary) hypertension: Secondary | ICD-10-CM | POA: Diagnosis not present

## 2021-05-02 DIAGNOSIS — G600 Hereditary motor and sensory neuropathy: Secondary | ICD-10-CM | POA: Diagnosis not present

## 2021-05-02 DIAGNOSIS — Z1159 Encounter for screening for other viral diseases: Secondary | ICD-10-CM | POA: Diagnosis not present

## 2021-05-02 DIAGNOSIS — Z741 Need for assistance with personal care: Secondary | ICD-10-CM | POA: Diagnosis not present

## 2021-05-02 DIAGNOSIS — M1990 Unspecified osteoarthritis, unspecified site: Secondary | ICD-10-CM | POA: Diagnosis not present

## 2021-05-02 DIAGNOSIS — M6281 Muscle weakness (generalized): Secondary | ICD-10-CM | POA: Diagnosis not present

## 2021-05-03 DIAGNOSIS — Z1159 Encounter for screening for other viral diseases: Secondary | ICD-10-CM | POA: Diagnosis not present

## 2021-05-03 DIAGNOSIS — Z741 Need for assistance with personal care: Secondary | ICD-10-CM | POA: Diagnosis not present

## 2021-05-03 DIAGNOSIS — G600 Hereditary motor and sensory neuropathy: Secondary | ICD-10-CM | POA: Diagnosis not present

## 2021-05-03 DIAGNOSIS — M6281 Muscle weakness (generalized): Secondary | ICD-10-CM | POA: Diagnosis not present

## 2021-05-06 DIAGNOSIS — M6281 Muscle weakness (generalized): Secondary | ICD-10-CM | POA: Diagnosis not present

## 2021-05-06 DIAGNOSIS — Z1159 Encounter for screening for other viral diseases: Secondary | ICD-10-CM | POA: Diagnosis not present

## 2021-05-06 DIAGNOSIS — G600 Hereditary motor and sensory neuropathy: Secondary | ICD-10-CM | POA: Diagnosis not present

## 2021-05-06 DIAGNOSIS — Z741 Need for assistance with personal care: Secondary | ICD-10-CM | POA: Diagnosis not present

## 2021-05-07 ENCOUNTER — Emergency Department
Admission: EM | Admit: 2021-05-07 | Discharge: 2021-05-08 | Disposition: A | Discharge status: Skilled Nursing Facility | Payer: Medicare Other | Attending: Emergency Medicine | Admitting: Emergency Medicine

## 2021-05-07 ENCOUNTER — Other Ambulatory Visit: Payer: Self-pay

## 2021-05-07 DIAGNOSIS — Z7982 Long term (current) use of aspirin: Secondary | ICD-10-CM | POA: Diagnosis not present

## 2021-05-07 DIAGNOSIS — M25561 Pain in right knee: Secondary | ICD-10-CM | POA: Diagnosis not present

## 2021-05-07 DIAGNOSIS — Z741 Need for assistance with personal care: Secondary | ICD-10-CM | POA: Diagnosis not present

## 2021-05-07 DIAGNOSIS — I2510 Atherosclerotic heart disease of native coronary artery without angina pectoris: Secondary | ICD-10-CM | POA: Insufficient documentation

## 2021-05-07 DIAGNOSIS — I509 Heart failure, unspecified: Secondary | ICD-10-CM | POA: Diagnosis not present

## 2021-05-07 DIAGNOSIS — J45909 Unspecified asthma, uncomplicated: Secondary | ICD-10-CM | POA: Diagnosis not present

## 2021-05-07 DIAGNOSIS — M545 Low back pain, unspecified: Secondary | ICD-10-CM | POA: Diagnosis not present

## 2021-05-07 DIAGNOSIS — E119 Type 2 diabetes mellitus without complications: Secondary | ICD-10-CM | POA: Diagnosis not present

## 2021-05-07 DIAGNOSIS — Z7902 Long term (current) use of antithrombotics/antiplatelets: Secondary | ICD-10-CM | POA: Insufficient documentation

## 2021-05-07 DIAGNOSIS — R519 Headache, unspecified: Secondary | ICD-10-CM | POA: Diagnosis not present

## 2021-05-07 DIAGNOSIS — S060XAA Concussion with loss of consciousness status unknown, initial encounter: Secondary | ICD-10-CM

## 2021-05-07 DIAGNOSIS — M25571 Pain in right ankle and joints of right foot: Secondary | ICD-10-CM | POA: Diagnosis not present

## 2021-05-07 DIAGNOSIS — I6381 Other cerebral infarction due to occlusion or stenosis of small artery: Secondary | ICD-10-CM | POA: Diagnosis not present

## 2021-05-07 DIAGNOSIS — M25511 Pain in right shoulder: Secondary | ICD-10-CM

## 2021-05-07 DIAGNOSIS — Y9301 Activity, walking, marching and hiking: Secondary | ICD-10-CM | POA: Diagnosis not present

## 2021-05-07 DIAGNOSIS — Z1159 Encounter for screening for other viral diseases: Secondary | ICD-10-CM | POA: Diagnosis not present

## 2021-05-07 DIAGNOSIS — S39012A Strain of muscle, fascia and tendon of lower back, initial encounter: Secondary | ICD-10-CM | POA: Diagnosis not present

## 2021-05-07 DIAGNOSIS — S0990XA Unspecified injury of head, initial encounter: Secondary | ICD-10-CM | POA: Diagnosis not present

## 2021-05-07 DIAGNOSIS — M549 Dorsalgia, unspecified: Secondary | ICD-10-CM | POA: Diagnosis not present

## 2021-05-07 DIAGNOSIS — W1809XA Striking against other object with subsequent fall, initial encounter: Secondary | ICD-10-CM | POA: Diagnosis not present

## 2021-05-07 DIAGNOSIS — R9431 Abnormal electrocardiogram [ECG] [EKG]: Secondary | ICD-10-CM | POA: Diagnosis not present

## 2021-05-07 DIAGNOSIS — M2578 Osteophyte, vertebrae: Secondary | ICD-10-CM | POA: Diagnosis not present

## 2021-05-07 DIAGNOSIS — G600 Hereditary motor and sensory neuropathy: Secondary | ICD-10-CM | POA: Diagnosis not present

## 2021-05-07 DIAGNOSIS — M6281 Muscle weakness (generalized): Secondary | ICD-10-CM | POA: Diagnosis not present

## 2021-05-07 DIAGNOSIS — S060X0A Concussion without loss of consciousness, initial encounter: Secondary | ICD-10-CM | POA: Diagnosis not present

## 2021-05-07 DIAGNOSIS — W19XXXA Unspecified fall, initial encounter: Secondary | ICD-10-CM

## 2021-05-07 DIAGNOSIS — M8588 Other specified disorders of bone density and structure, other site: Secondary | ICD-10-CM | POA: Diagnosis not present

## 2021-05-07 DIAGNOSIS — R52 Pain, unspecified: Secondary | ICD-10-CM | POA: Diagnosis not present

## 2021-05-07 NOTE — ED Triage Notes (Signed)
Patient from [facility] care center via EMS. Reports mechanical fall. States she hit head on wall and now reporting head pain, left lower back pain and right knee pain. Denies LOC, denies blood thinners. States she initially felt dizzy upon standing after fall, denies dizziness on arrival to ED. Alert, oriented to name, place and situation, unable to recall year. Resp even, unlabored on RA.  ?

## 2021-05-08 ENCOUNTER — Emergency Department: Payer: Medicare Other

## 2021-05-08 DIAGNOSIS — S060XAA Concussion with loss of consciousness status unknown, initial encounter: Secondary | ICD-10-CM | POA: Diagnosis not present

## 2021-05-08 DIAGNOSIS — M25569 Pain in unspecified knee: Secondary | ICD-10-CM | POA: Diagnosis not present

## 2021-05-08 DIAGNOSIS — Z743 Need for continuous supervision: Secondary | ICD-10-CM | POA: Diagnosis not present

## 2021-05-08 DIAGNOSIS — Z741 Need for assistance with personal care: Secondary | ICD-10-CM | POA: Diagnosis not present

## 2021-05-08 DIAGNOSIS — M25561 Pain in right knee: Secondary | ICD-10-CM | POA: Diagnosis not present

## 2021-05-08 DIAGNOSIS — I6381 Other cerebral infarction due to occlusion or stenosis of small artery: Secondary | ICD-10-CM | POA: Diagnosis not present

## 2021-05-08 DIAGNOSIS — M2578 Osteophyte, vertebrae: Secondary | ICD-10-CM | POA: Diagnosis not present

## 2021-05-08 DIAGNOSIS — M6281 Muscle weakness (generalized): Secondary | ICD-10-CM | POA: Diagnosis not present

## 2021-05-08 DIAGNOSIS — M8588 Other specified disorders of bone density and structure, other site: Secondary | ICD-10-CM | POA: Diagnosis not present

## 2021-05-08 DIAGNOSIS — M545 Low back pain, unspecified: Secondary | ICD-10-CM | POA: Diagnosis not present

## 2021-05-08 DIAGNOSIS — I10 Essential (primary) hypertension: Secondary | ICD-10-CM | POA: Diagnosis not present

## 2021-05-08 DIAGNOSIS — G600 Hereditary motor and sensory neuropathy: Secondary | ICD-10-CM | POA: Diagnosis not present

## 2021-05-08 DIAGNOSIS — Z1159 Encounter for screening for other viral diseases: Secondary | ICD-10-CM | POA: Diagnosis not present

## 2021-05-08 DIAGNOSIS — M25571 Pain in right ankle and joints of right foot: Secondary | ICD-10-CM | POA: Diagnosis not present

## 2021-05-08 DIAGNOSIS — W19XXXD Unspecified fall, subsequent encounter: Secondary | ICD-10-CM | POA: Diagnosis not present

## 2021-05-08 DIAGNOSIS — W19XXXA Unspecified fall, initial encounter: Secondary | ICD-10-CM | POA: Diagnosis not present

## 2021-05-08 DIAGNOSIS — M25511 Pain in right shoulder: Secondary | ICD-10-CM | POA: Diagnosis not present

## 2021-05-08 LAB — COMPREHENSIVE METABOLIC PANEL
ALT: 16 U/L (ref 0–44)
AST: 16 U/L (ref 15–41)
Albumin: 3.5 g/dL (ref 3.5–5.0)
Alkaline Phosphatase: 66 U/L (ref 38–126)
Anion gap: 7 (ref 5–15)
BUN: 16 mg/dL (ref 8–23)
CO2: 30 mmol/L (ref 22–32)
Calcium: 9.4 mg/dL (ref 8.9–10.3)
Chloride: 94 mmol/L — ABNORMAL LOW (ref 98–111)
Creatinine, Ser: 0.84 mg/dL (ref 0.44–1.00)
GFR, Estimated: >60 mL/min (ref >60)
Glucose, Bld: 195 mg/dL — ABNORMAL HIGH (ref 70–99)
Potassium: 4 mmol/L (ref 3.5–5.1)
Sodium: 131 mmol/L — ABNORMAL LOW (ref 135–145)
Total Bilirubin: 0.7 mg/dL (ref 0.3–1.2)
Total Protein: 6.3 g/dL — ABNORMAL LOW (ref 6.5–8.1)

## 2021-05-08 LAB — CBC WITH DIFFERENTIAL/PLATELET
Abs Immature Granulocytes: 0.03 10*3/uL (ref 0.00–0.07)
Basophils Absolute: 0 10*3/uL (ref 0.0–0.1)
Basophils Relative: 0 %
Eosinophils Absolute: 0.1 10*3/uL (ref 0.0–0.5)
Eosinophils Relative: 2 %
HCT: 40.8 % (ref 36.0–46.0)
Hemoglobin: 13.2 g/dL (ref 12.0–15.0)
Immature Granulocytes: 1 %
Lymphocytes Relative: 34 %
Lymphs Abs: 2 10*3/uL (ref 0.7–4.0)
MCH: 29.6 pg (ref 26.0–34.0)
MCHC: 32.4 g/dL (ref 30.0–36.0)
MCV: 91.5 fL (ref 80.0–100.0)
Monocytes Absolute: 0.5 10*3/uL (ref 0.1–1.0)
Monocytes Relative: 8 %
Neutro Abs: 3.3 10*3/uL (ref 1.7–7.7)
Neutrophils Relative %: 55 %
Platelets: 183 10*3/uL (ref 150–400)
RBC: 4.46 MIL/uL (ref 3.87–5.11)
RDW: 12.1 % (ref 11.5–15.5)
WBC: 6 10*3/uL (ref 4.0–10.5)
nRBC: 1.7 % — ABNORMAL HIGH (ref 0.0–0.2)

## 2021-05-08 LAB — TROPONIN I (HIGH SENSITIVITY): Troponin I (High Sensitivity): 10 ng/L (ref <18)

## 2021-05-08 NOTE — Discharge Instructions (Addendum)
Please use acetaminophen (Tylenol) up to 4 g/day for any continued pain 

## 2021-05-08 NOTE — ED Notes (Signed)
Assisted patient up to toilet. Continent of urine. Assisted back to bed. No distress noted at this time. ?

## 2021-05-08 NOTE — ED Provider Notes (Signed)
? ?[facility] ?Provider Note ? ? Event Date/Time  ? First MD Initiated Contact with Patient 05/07/21 2352   ?  (approximate) ?History  ?Fall ? ?HPI ?Lydia King is a 88 y.o. female with past medical history of CHF, CAD, OSA, asthma, type 2 diabetes, osteopenia, and osteoarthritis of multiple joints as well as history of CVA with cortical blindness who presents via EMS after a reported mechanical fall from standing.  Patient states that she was walking back from the bathroom when she attempted to get her walker and move over to her CPAP machine before "the next thing I know" patient was on the ground with headache and feeling somewhat lightheaded.  Patient states that the lightheadedness lasted approximately 10 minutes and resolved spontaneously.  Patient was able to ambulate after being lifted and using her walker.  Patient is concerned as she is taking Plavix and had this head injury and was recommended to come to the emergency department for further evaluation and management.  Patient also complains of right ankle pain, right shoulder pain, right knee pain, and right lower lumbar paraspinal muscular pain.  Patient denies any numbness/paresthesias in any extremity.  ?Physical Exam  ?Triage Vital Signs: ?ED Triage Vitals  ?Enc Vitals Group  ?   BP 05/07/21 2356 (!) 172/80  ?   Pulse Rate 05/07/21 2356 69  ?   Resp 05/07/21 2356 18  ?   Temp 05/07/21 2356 98.2 ?F (36.8 ?C)  ?   Temp Source 05/07/21 2356 Oral  ?   SpO2 05/07/21 2356 99 %  ?   Weight 05/07/21 2358 139 lb (63 kg)  ?   Height 05/07/21 2358 '4\' 11"'$  (1.499 m)  ?   Head Circumference --   ?   Peak Flow --   ?   Pain Score 05/07/21 2358 8  ?   Pain Loc --   ?   Pain Edu? --   ?   Excl. in [place]? --   ? ?Most recent vital signs: ?Vitals:  ? 05/08/21 0000 05/08/21 0141  ?BP: 133/87 (!) 168/72  ?Pulse: 67 64  ?Resp: 13 (!) 23  ?Temp:    ?SpO2: 99% 93%  ? ?General: Awake, oriented x4. ?CV:  Good peripheral perfusion.  ?Resp:  Normal  effort.  ?Abd:  No distention.  ?Other:  Elderly overweight Caucasian female laying in bed in no distress.  Mild tenderness to palpation over right knee and ankle as well as right lower lumbar paraspinal musculature ?ED Results / Procedures / Treatments  ?Labs ?(all labs ordered are listed, but only abnormal results are displayed) ?Labs Reviewed  ?CBC WITH DIFFERENTIAL/PLATELET - Abnormal; Notable for the following components:  ?    Result Value  ? nRBC 1.7 (*)   ? All other components within normal limits  ?COMPREHENSIVE METABOLIC PANEL - Abnormal; Notable for the following components:  ? Sodium 131 (*)   ? Chloride 94 (*)   ? Glucose, Bld 195 (*)   ? Total Protein 6.3 (*)   ? All other components within normal limits  ?TROPONIN I (HIGH SENSITIVITY)  ? ?EKG ?ED ECG REPORT ?I, Naaman Plummer, the attending physician, personally viewed and interpreted this ECG. ?Date: 05/08/2021 ?EKG Time: 0001 ?Rate: 67 ?Rhythm: normal sinus rhythm ?QRS Axis: normal ?Intervals: normal ?ST/T Wave abnormalities: normal ?Narrative Interpretation: no evidence of acute ischemia ?RADIOLOGY ?ED MD interpretation: X-rays of the right ankle/knee/shoulder does not show any evidence of acute fractures or dislocations.  These images were interpreted by me ?X-rays of the lumbar spine interpreted by me shows no evidence of acute fractures, dislocations, or acute listhesis ? ?CT of the head without contrast interpreted by me shows no evidence of acute abnormalities including no intracerebral hemorrhage, obvious masses, or significant edema ? ?CT of the cervical spine interpreted by me does not show any evidence of acute abnormalities including no acute fracture, malalignment, height loss, or dislocation ?-Agree with radiology assessment ?Official radiology report(s): ?DG Lumbar Spine Complete ? ?Result Date: 05/08/2021 ?CLINICAL DATA:  Recent fall with low back pain, initial encounter EXAM: LUMBAR SPINE - COMPLETE 4+ VIEW COMPARISON:  03/14/2020  FINDINGS: Generalized osteopenia is noted. Five lumbar type vertebral bodies are well visualized. Vertebral body height is well maintained. No pars defects are noted. Mild facet hypertrophic changes are seen. No acute fracture is noted. No soft tissue changes are noted. IMPRESSION: Generalized osteopenia and degenerative change. No acute abnormality noted. Electronically Signed   By: Inez Catalina M.D.   On: 05/08/2021 00:55  ? ?DG Shoulder Right ? ?Result Date: 05/08/2021 ?CLINICAL DATA:  Recent fall with right shoulder pain, initial encounter EXAM: RIGHT SHOULDER - 2+ VIEW COMPARISON:  03/14/2020 FINDINGS: Degenerative changes of the shoulder joint are noted to include the acromioclavicular and glenohumeral joint. Humeral head is high-riding with articulation and remodeling of the acromion likely related to chronic rotator cuff injury. These changes are stable in appearance from the prior exam. No acute fracture or dislocation is noted. IMPRESSION: Chronic degenerative changes as described. No acute abnormality noted. Electronically Signed   By: Inez Catalina M.D.   On: 05/08/2021 00:56  ? ?DG Ankle Complete Right ? ?Result Date: 05/08/2021 ?CLINICAL DATA:  Right ankle pain following fall, initial encounter EXAM: RIGHT ANKLE - COMPLETE 3+ VIEW COMPARISON:  12/14/2019 FINDINGS: Postsurgical changes are again identified consistent with prior hindfoot fusion. Fracture of 1 of the fixation screws is noted and stable. No acute fracture or dislocation is noted. No soft tissue changes are seen. IMPRESSION: Changes of prior fusion.  No acute abnormality noted. Electronically Signed   By: Inez Catalina M.D.   On: 05/08/2021 00:57  ? ?CT Head Wo Contrast ? ?Result Date: 05/08/2021 ?CLINICAL DATA:  Status post fall. EXAM: CT HEAD WITHOUT CONTRAST TECHNIQUE: Contiguous axial images were obtained from the base of the skull through the vertex without intravenous contrast. RADIATION DOSE REDUCTION: This exam was performed according to  the departmental dose-optimization program which includes automated exposure control, adjustment of the mA and/or kV according to patient size and/or use of iterative reconstruction technique. COMPARISON:  March 14, 2020 FINDINGS: Brain: There is mild cerebral atrophy with widening of the extra-axial spaces and ventricular dilatation. There are areas of decreased attenuation within the white matter tracts of the supratentorial brain, consistent with microvascular disease changes. Chronic bilateral cerebellar and bilateral occipital lobe infarcts are seen. Small, chronic bilateral basal ganglia lacunar infarcts are also noted. Vascular: No hyperdense vessel or unexpected calcification. Skull: Normal. Negative for fracture or focal lesion. Sinuses/Orbits: No acute finding. Other: None. IMPRESSION: 1. No acute intracranial abnormality. 2. Chronic bilateral cerebellar and bilateral occipital lobe infarcts. 3. Generalized cerebral atrophy with chronic white matter small vessel ischemia. Electronically Signed   By: Virgina Norfolk M.D.   On: 05/08/2021 01:00  ? ?CT Cervical Spine Wo Contrast ? ?Result Date: 05/08/2021 ?CLINICAL DATA:  Status post fall. EXAM: CT CERVICAL SPINE WITHOUT CONTRAST TECHNIQUE: Multidetector CT imaging of the cervical spine was performed without  intravenous contrast. Multiplanar CT image reconstructions were also generated. RADIATION DOSE REDUCTION: This exam was performed according to the departmental dose-optimization program which includes automated exposure control, adjustment of the mA and/or kV according to patient size and/or use of iterative reconstruction technique. COMPARISON:  December 14, 2019 FINDINGS: Alignment: There is approximately 2 mm anterolisthesis of the C3 vertebral body on C4. Skull base and vertebrae: No acute fracture. No primary bone lesion or focal pathologic process. Soft tissues and spinal canal: No prevertebral fluid or swelling. No visible canal hematoma. Disc  levels: Moderate severity endplate sclerosis and osteophyte formation are seen at the levels of C2-C3, with marked severity endplate sclerosis and moderate severity osteophyte formation seen at the levels o

## 2021-05-08 NOTE — ED Notes (Signed)
[facility] called for transport  ?

## 2021-05-09 DIAGNOSIS — Z1159 Encounter for screening for other viral diseases: Secondary | ICD-10-CM | POA: Diagnosis not present

## 2021-05-09 DIAGNOSIS — M6281 Muscle weakness (generalized): Secondary | ICD-10-CM | POA: Diagnosis not present

## 2021-05-09 DIAGNOSIS — G600 Hereditary motor and sensory neuropathy: Secondary | ICD-10-CM | POA: Diagnosis not present

## 2021-05-09 DIAGNOSIS — Z741 Need for assistance with personal care: Secondary | ICD-10-CM | POA: Diagnosis not present

## 2021-05-10 DIAGNOSIS — G600 Hereditary motor and sensory neuropathy: Secondary | ICD-10-CM | POA: Diagnosis not present

## 2021-05-10 DIAGNOSIS — Z1159 Encounter for screening for other viral diseases: Secondary | ICD-10-CM | POA: Diagnosis not present

## 2021-05-10 DIAGNOSIS — M6281 Muscle weakness (generalized): Secondary | ICD-10-CM | POA: Diagnosis not present

## 2021-05-10 DIAGNOSIS — Z741 Need for assistance with personal care: Secondary | ICD-10-CM | POA: Diagnosis not present

## 2021-05-11 DIAGNOSIS — G600 Hereditary motor and sensory neuropathy: Secondary | ICD-10-CM | POA: Diagnosis not present

## 2021-05-11 DIAGNOSIS — Z741 Need for assistance with personal care: Secondary | ICD-10-CM | POA: Diagnosis not present

## 2021-05-11 DIAGNOSIS — Z1159 Encounter for screening for other viral diseases: Secondary | ICD-10-CM | POA: Diagnosis not present

## 2021-05-11 DIAGNOSIS — M6281 Muscle weakness (generalized): Secondary | ICD-10-CM | POA: Diagnosis not present

## 2021-05-13 DIAGNOSIS — I10 Essential (primary) hypertension: Secondary | ICD-10-CM | POA: Diagnosis not present

## 2021-05-13 DIAGNOSIS — G600 Hereditary motor and sensory neuropathy: Secondary | ICD-10-CM | POA: Diagnosis not present

## 2021-05-13 DIAGNOSIS — Z741 Need for assistance with personal care: Secondary | ICD-10-CM | POA: Diagnosis not present

## 2021-05-13 DIAGNOSIS — K1379 Other lesions of oral mucosa: Secondary | ICD-10-CM | POA: Diagnosis not present

## 2021-05-13 DIAGNOSIS — M6281 Muscle weakness (generalized): Secondary | ICD-10-CM | POA: Diagnosis not present

## 2021-05-13 DIAGNOSIS — Z1159 Encounter for screening for other viral diseases: Secondary | ICD-10-CM | POA: Diagnosis not present

## 2021-05-14 DIAGNOSIS — G600 Hereditary motor and sensory neuropathy: Secondary | ICD-10-CM | POA: Diagnosis not present

## 2021-05-14 DIAGNOSIS — Z741 Need for assistance with personal care: Secondary | ICD-10-CM | POA: Diagnosis not present

## 2021-05-14 DIAGNOSIS — Z1159 Encounter for screening for other viral diseases: Secondary | ICD-10-CM | POA: Diagnosis not present

## 2021-05-14 DIAGNOSIS — M6281 Muscle weakness (generalized): Secondary | ICD-10-CM | POA: Diagnosis not present

## 2021-05-15 DIAGNOSIS — Z1159 Encounter for screening for other viral diseases: Secondary | ICD-10-CM | POA: Diagnosis not present

## 2021-05-15 DIAGNOSIS — Z741 Need for assistance with personal care: Secondary | ICD-10-CM | POA: Diagnosis not present

## 2021-05-15 DIAGNOSIS — G600 Hereditary motor and sensory neuropathy: Secondary | ICD-10-CM | POA: Diagnosis not present

## 2021-05-15 DIAGNOSIS — M6281 Muscle weakness (generalized): Secondary | ICD-10-CM | POA: Diagnosis not present

## 2021-05-16 DIAGNOSIS — G600 Hereditary motor and sensory neuropathy: Secondary | ICD-10-CM | POA: Diagnosis not present

## 2021-05-16 DIAGNOSIS — Z1159 Encounter for screening for other viral diseases: Secondary | ICD-10-CM | POA: Diagnosis not present

## 2021-05-16 DIAGNOSIS — Z741 Need for assistance with personal care: Secondary | ICD-10-CM | POA: Diagnosis not present

## 2021-05-16 DIAGNOSIS — M6281 Muscle weakness (generalized): Secondary | ICD-10-CM | POA: Diagnosis not present

## 2021-05-17 ENCOUNTER — Ambulatory Visit (INDEPENDENT_AMBULATORY_CARE_PROVIDER_SITE_OTHER): Payer: Medicare Other | Admitting: Family Medicine

## 2021-05-17 ENCOUNTER — Encounter: Payer: Self-pay | Admitting: Family Medicine

## 2021-05-17 VITALS — BP 126/70 | HR 79 | Temp 97.7°F | Ht 59.0 in | Wt 136.1 lb

## 2021-05-17 DIAGNOSIS — I5032 Chronic diastolic (congestive) heart failure: Secondary | ICD-10-CM | POA: Diagnosis not present

## 2021-05-17 DIAGNOSIS — K121 Other forms of stomatitis: Secondary | ICD-10-CM | POA: Diagnosis not present

## 2021-05-17 DIAGNOSIS — W19XXXD Unspecified fall, subsequent encounter: Secondary | ICD-10-CM

## 2021-05-17 DIAGNOSIS — G600 Hereditary motor and sensory neuropathy: Secondary | ICD-10-CM | POA: Diagnosis not present

## 2021-05-17 DIAGNOSIS — M6281 Muscle weakness (generalized): Secondary | ICD-10-CM | POA: Diagnosis not present

## 2021-05-17 DIAGNOSIS — I10 Essential (primary) hypertension: Secondary | ICD-10-CM | POA: Diagnosis not present

## 2021-05-17 DIAGNOSIS — I25118 Atherosclerotic heart disease of native coronary artery with other forms of angina pectoris: Secondary | ICD-10-CM

## 2021-05-17 DIAGNOSIS — K449 Diaphragmatic hernia without obstruction or gangrene: Secondary | ICD-10-CM | POA: Diagnosis not present

## 2021-05-17 DIAGNOSIS — Z1159 Encounter for screening for other viral diseases: Secondary | ICD-10-CM | POA: Diagnosis not present

## 2021-05-17 DIAGNOSIS — Y92009 Unspecified place in unspecified non-institutional (private) residence as the place of occurrence of the external cause: Secondary | ICD-10-CM

## 2021-05-17 DIAGNOSIS — Z741 Need for assistance with personal care: Secondary | ICD-10-CM | POA: Diagnosis not present

## 2021-05-17 MED ORDER — LYSINE 500 MG PO CAPS
ORAL_CAPSULE | ORAL | 3 refills | Status: DC
Start: 2021-05-17 — End: 2022-06-24

## 2021-05-17 MED ORDER — CALCIUM CARBONATE ANTACID 500 MG PO CHEW: 12.0000 | CHEWABLE_TABLET | Freq: Three times a day (TID) | ORAL | 11 refills | Status: DC

## 2021-05-17 NOTE — Progress Notes (Signed)
? ?Subjective:  ? ? Patient ID: Lydia King, female    DOB: 1930/02/27, 88 y.o.   MRN: [id number] ? ?HPI ?Pt presents for f/u of ER visit on 4/11 for a fall  ?Also has a sore in her mouth ?Also needs order for tums ? ?Wt Readings from Last 3 Encounters:  ?05/17/21 136 lb 2 oz (61.7 kg)  ?05/07/21 139 lb (63 kg)  ?03/04/21 137 lb 1.6 oz (62.2 kg)  ? ?27.49 kg/m? ? ?HTN ?BP Readings from Last 3 Encounters:  ?05/17/21 126/70  ?05/08/21 (!) 169/69  ?03/04/21 134/64  ? ?Has a new dental plate  ?It is irritating her mouth ?Needs lysine /helps her  ? ?XR done of R ankle and shoulder and LS and R knee ?No new fx  ?CT head no acute findings  ? ?CT CS: no new changes/ has severe deg chronic changes  ? ?Head trauma inst were given upon discharge ? ?She is under palliative care for multiple diagnoses  ? ?Stays in pain all the time / R rotator cuff injury/ healed wrong and now it is only a problem ?A lot of arthritis  ? ?Never able to have the [facility] surgery done, she was too high risk for anesthesia  ?Needs more tums (gets one tums with a meal)  ? ?Lab Results  ?Component Value Date  ? CREATININE 0.84 05/07/2021  ? BUN 16 05/07/2021  ? NA 131 (L) 05/07/2021  ? K 4.0 05/07/2021  ? CL 94 (L) 05/07/2021  ? CO2 30 05/07/2021  ? ?Lab Results  ?Component Value Date  ? WBC 6.0 05/07/2021  ? HGB 13.2 05/07/2021  ? HCT 40.8 05/07/2021  ? MCV 91.5 05/07/2021  ? PLT 183 05/07/2021  ? ?BNP 243.5 ?Glucose 195 ? ?Just started back walking today and did very well  ?Some bedside exercises also which is working  ? ?Lab Results  ?Component Value Date  ? HGBA1C 7.0 (H) 10/31/2019  ?Sees endocrinology ?Had cut back her metformin recently -possibly due to diarrhea  ? ? ?Lab Results  ?Component Value Date  ? CHOL 179 10/31/2019  ? HDL 57.60 10/31/2019  ? [place] 85 10/31/2019  ? LDLDIRECT 78.0 07/14/2016  ? TRIG 180.0 (H) 10/31/2019  ? CHOLHDL 3 10/31/2019  ? ?Patient Active Problem List  ? Diagnosis Date Noted  ? Mouth ulcers 05/19/2021  ?  Dysphagia 10/08/2020  ? Pain due to onychomycosis of toenails of both feet 11/24/2019  ? Anxiety 11/09/2019  ? Chronic diastolic CHF (congestive heart failure) ([place]) 11/03/2019  ? Hiatal hernia   ? Chest pain 11/02/2019  ? CAD (coronary artery disease) 08/01/2019  ? Anemia 08/01/2019  ? Acute on chronic congestive heart failure ([place]) 07/31/2019  ? Acute decompensated heart failure ([place]) 07/03/2019  ? Bradycardia 06/17/2019  ? Hypoalbuminemia 06/17/2019  ? Aortic valve stenosis 06/15/2019  ? Mitral valve regurgitation 06/15/2019  ? Positive cardiac stress test 06/13/2019  ? Periprosthetic fracture around internal prosthetic right knee joint 03/15/2019  ? Hyperlipidemia associated with type 2 diabetes mellitus ([place]) 07/27/2018  ? Sinus congestion 02/05/2018  ? Vertigo 05/06/2017  ? OSA (obstructive sleep apnea) 01/02/2016  ? Pedal edema 01/02/2016  ? Fall in home 12/11/2015  ? Poor balance 12/11/2015  ? Low back pain with sciatica 10/02/2015  ? Right hip pain 10/02/2015  ? Hearing loss 04/18/2015  ? Encounter for Medicare annual wellness exam 10/04/2013  ? Type 2 diabetes mellitus without complications ([place]) 11/23/2534  ? History of CVA (cerebrovascular accident) 09/12/2008  ?  Osteoarthritis of multiple joints 05/30/2008  ? Osteoporosis 05/30/2008  ? Vitamin D deficiency 07/14/2007  ? Sleep apnea 07/14/2007  ? Essential hypertension 11/30/2006  ? Asthma 11/30/2006  ? H/O gastroesophageal reflux (GERD) 11/30/2006  ? G I BLEED 11/30/2006  ? DEEP VENOUS THROMBOPHLEBITIS, HX OF 11/30/2006  ? ?Past Medical History:  ?Diagnosis Date  ? (HFpEF) heart failure with preserved ejection fraction ([place])   ? a. 06/2019 Echo ([facility]): EF >55%, mild conc LVH. Mild-mod AS, mild MR/TR.  ? Anemia   ? in past (from blood loss at hiatal hernia)  ? Asthma   ? CAD (coronary artery disease)   ? a. 05/2019 MI/PCI: ost LCX (rota + cba-->3.0x16 Synergy DES), LAD 90 (3.0x16 Synergy DES).  ? Charcot-Marie-Tooth disease   ? walks with cane (fairly  controlled)  ? Diverticulosis   ? DVT (deep venous thrombosis) ([place]) 2003  ? after sx  ? GERD (gastroesophageal reflux disease)   ? Grief reaction   ? on fluoxetine  ? Headache(784.0)   ? migraine  ? Hiatal hernia   ? Hyperlipidemia   ? Hypertension   ? OA (ocular albinism) ([place])   ? Old myocardial infarction   ? Osteopenia   ? PUD (peptic ulcer disease)   ? gastric, past  ? Shingles   ? Stroke ([place]) 8/10  ? posterior reversible encephalopathy syndrome  ? Urine incontinence   ? ?Past Surgical History:  ?Procedure Laterality Date  ? carotid dopplers  8/10  ? COLONOSCOPY  2002  ? HERNIA REPAIR  2002  ? bleeding  ? HIATAL HERNIA REPAIR  2000  ? needed blood transfusion  ? Stroke  8/10  ? hosp stroke/ posterior (vs posterior reversible encephalopathy syndrome) - d/c on Plavix  ? TEE normal  6/73  ? no emoblic source  ? TOTAL KNEE ARTHROPLASTY  03,06  ? Bilateral  ? VESICOVAGINAL FISTULA CLOSURE W/ TAH  1964  ? with bleeding and cervical dysplasia  ? ?Social History  ? ?Tobacco Use  ? Smoking status: Never  ? Smokeless tobacco: Never  ?Vaping Use  ? Vaping Use: Never used  ?Substance Use Topics  ? Alcohol use: No  ? Drug use: No  ? ?Family History  ?Problem Relation Age of Onset  ? Cerebral aneurysm Son   ? Cerebral aneurysm Father   ? Hyperlipidemia Father   ? Alcohol abuse Father   ? Stroke Mother   ? Cervical cancer Sister   ? Hyperlipidemia Brother   ? Alcohol abuse Brother   ? Stroke Brother   ? Diabetes Brother   ? Diabetes Sister   ? Sarcoidosis Sister   ? Other Sister   ?     OP/ Broken hip  ? Other Other   ?     whole family has charcot marie tooth  ? Ovarian cancer Sister   ? ?Allergies  ?Allergen Reactions  ? Morphine And Related Anaphylaxis  ? Other Shortness Of Breath and Other (See Comments)  ? Shrimp [Shellfish Allergy] Anaphylaxis  ?  Anaphylaxis-swelling of throat/sob  ?  ? Amlodipine   ? Amlodipine Besylate   ?  REACTION: edema  ? Atorvastatin   ?  REACTION: pain- could not walk  ? Ciprofloxacin Other  (See Comments)  ?  REACTION: unkown ?Trouble breathing  ? Influenza Vaccines Other (See Comments)  ?  Arm swelling, redness, chills, fever  ? Iodinated Contrast Media   ? Naproxen Other (See Comments)  ?  Throat swelling  ? Nitrofurantoin   ?  Omeprazole   ?  REACTION: not effective  ? Pantoprazole Sodium   ?  REACTION: does not work well  ? Pantoprazole Sodium   ? Penicillin G Hives  ?  ITCHY RASH  ? Penicillins   ?  REACTION: itchy rash  ? Simvastatin   ?  REACTION: leg pain  ? Sulfa Antibiotics   ? Sulfonamide Derivatives   ?  REACTION: itchy rash  ? Codeine Rash  ?  REACTION: itchy rash ?ITCHY RASH, also breathing trouble  ? Prednisone Other (See Comments)  ?  REACTION: RASH.// SHORTNESS OF BREATH ?Prior possible flushing reaction in face, but tolerated it in 2020 and 2021 up to '20mg'$   ? ?Current Outpatient Medications on File Prior to Visit  ?Medication Sig Dispense Refill  ? aspirin EC 81 MG EC tablet Take 1 tablet (81 mg total) by mouth daily. Swallow whole. 30 tablet 11  ? carvedilol (COREG) 6.25 MG tablet Take 1 tablet (6.25 mg total) by mouth 2 (two) times daily with a meal. 60 tablet 1  ? diclofenac Sodium (VOLTAREN) 1 % GEL Apply 2 g topically 3 (three) times daily.    ? famotidine (PEPCID) 20 MG tablet Take 20 mg by mouth 2 (two) times daily.    ? FLUoxetine (PROZAC) 20 MG capsule Take 20 mg by mouth daily.    ? furosemide (LASIX) 40 MG tablet Take 1 tablet (40 mg total) by mouth daily. 1 tablet 0  ? lisinopril (ZESTRIL) 20 MG tablet Take 40 mg by mouth daily.    ? magic mouthwash SOLN Take 15 mLs by mouth 3 (three) times daily as needed for mouth pain. 1:1:1% Lido/Diphen/Maalox    ? melatonin 3 MG TABS tablet Take 3 mg by mouth at bedtime.    ? prednisoLONE acetate (PRED FORTE) 1 % ophthalmic suspension Place 1 drop into the left eye every 12 (twelve) hours.    ? Propylene Glycol (SYSTANE BALANCE) 0.6 % SOLN Apply 1 drop to eye at bedtime as needed.    ? SitaGLIPtin-MetFORMIN HCl (JANUMET XR) [phone] MG  TB24 Take 1 tablet by mouth daily.    ? traMADol (ULTRAM) 50 MG tablet Take 50 mg by mouth 4 (four) times daily as needed.    ? trolamine salicylate (ASPERCREME/ALOE) 10 % cream Apply 1 application topically dail

## 2021-05-17 NOTE — Patient Instructions (Signed)
Stop mylanta  ? ?Take 2 tums three times daily with meals  ? ?Miralax once daily for constipation  ? ?Lysine 500 mg 2 pills three times daily  ? ?I will place a pharmacy referral to touch base with you  ? ?Blood pressure is good today  ?

## 2021-05-19 DIAGNOSIS — K121 Other forms of stomatitis: Secondary | ICD-10-CM | POA: Insufficient documentation

## 2021-05-19 NOTE — Assessment & Plan Note (Signed)
Worsened by her new dental plate, but getting better  ?Requests lysine px for 2 pills bid (has helped in the past)  ?Will update if not helpful ?

## 2021-05-19 NOTE — Assessment & Plan Note (Signed)
bp in fair control at this time  ?BP Readings from Last 1 Encounters:  ?05/17/21 126/70  ? ?No changes needed ?Most recent labs reviewed  ?Disc lifstyle change with low sodium diet and exercise  ?Lisinopril 40 mg daily ?Coreg 6.25 mg bid ?Lasix 40 mg daily ?imdur 30 mg bid  ?

## 2021-05-19 NOTE — Assessment & Plan Note (Signed)
Large HH, surgery was discussed but surgeon thought pt was too high risk (CV) ?Mod to severe GERD ?Would like to stop mylanta and try taking 2 tums with each meal (watching for constipation) ? ?

## 2021-05-19 NOTE — Assessment & Plan Note (Signed)
Recent fall on R side ?Reviewed hospital records, lab results and studies in detail  ?No fractures on xr of ankle, shoulder and knee, neg CT of head and CS ?

## 2021-05-20 DIAGNOSIS — G600 Hereditary motor and sensory neuropathy: Secondary | ICD-10-CM | POA: Diagnosis not present

## 2021-05-20 DIAGNOSIS — M6281 Muscle weakness (generalized): Secondary | ICD-10-CM | POA: Diagnosis not present

## 2021-05-20 DIAGNOSIS — Z1159 Encounter for screening for other viral diseases: Secondary | ICD-10-CM | POA: Diagnosis not present

## 2021-05-20 DIAGNOSIS — Z741 Need for assistance with personal care: Secondary | ICD-10-CM | POA: Diagnosis not present

## 2021-05-21 DIAGNOSIS — Z741 Need for assistance with personal care: Secondary | ICD-10-CM | POA: Diagnosis not present

## 2021-05-21 DIAGNOSIS — Z1159 Encounter for screening for other viral diseases: Secondary | ICD-10-CM | POA: Diagnosis not present

## 2021-05-21 DIAGNOSIS — G600 Hereditary motor and sensory neuropathy: Secondary | ICD-10-CM | POA: Diagnosis not present

## 2021-05-21 DIAGNOSIS — M6281 Muscle weakness (generalized): Secondary | ICD-10-CM | POA: Diagnosis not present

## 2021-05-22 DIAGNOSIS — Z1159 Encounter for screening for other viral diseases: Secondary | ICD-10-CM | POA: Diagnosis not present

## 2021-05-22 DIAGNOSIS — G600 Hereditary motor and sensory neuropathy: Secondary | ICD-10-CM | POA: Diagnosis not present

## 2021-05-22 DIAGNOSIS — Z741 Need for assistance with personal care: Secondary | ICD-10-CM | POA: Diagnosis not present

## 2021-05-22 DIAGNOSIS — M6281 Muscle weakness (generalized): Secondary | ICD-10-CM | POA: Diagnosis not present

## 2021-05-23 ENCOUNTER — Telehealth: Payer: Self-pay | Admitting: Family Medicine

## 2021-05-23 DIAGNOSIS — F331 Major depressive disorder, recurrent, moderate: Secondary | ICD-10-CM | POA: Diagnosis not present

## 2021-05-23 DIAGNOSIS — Z741 Need for assistance with personal care: Secondary | ICD-10-CM | POA: Diagnosis not present

## 2021-05-23 DIAGNOSIS — M6281 Muscle weakness (generalized): Secondary | ICD-10-CM | POA: Diagnosis not present

## 2021-05-23 DIAGNOSIS — G600 Hereditary motor and sensory neuropathy: Secondary | ICD-10-CM | POA: Diagnosis not present

## 2021-05-23 DIAGNOSIS — F411 Generalized anxiety disorder: Secondary | ICD-10-CM | POA: Diagnosis not present

## 2021-05-23 DIAGNOSIS — Z1159 Encounter for screening for other viral diseases: Secondary | ICD-10-CM | POA: Diagnosis not present

## 2021-05-23 DIAGNOSIS — F5105 Insomnia due to other mental disorder: Secondary | ICD-10-CM | POA: Diagnosis not present

## 2021-05-23 IMAGING — CR DG KNEE 1-2V*R*
2 series · 2 of 2 positions shown · non-contrast
Comparison: None.

CLINICAL DATA: Fall with right knee pain

EXAM:
RIGHT KNEE - 1-2 VIEW

[knee ap]
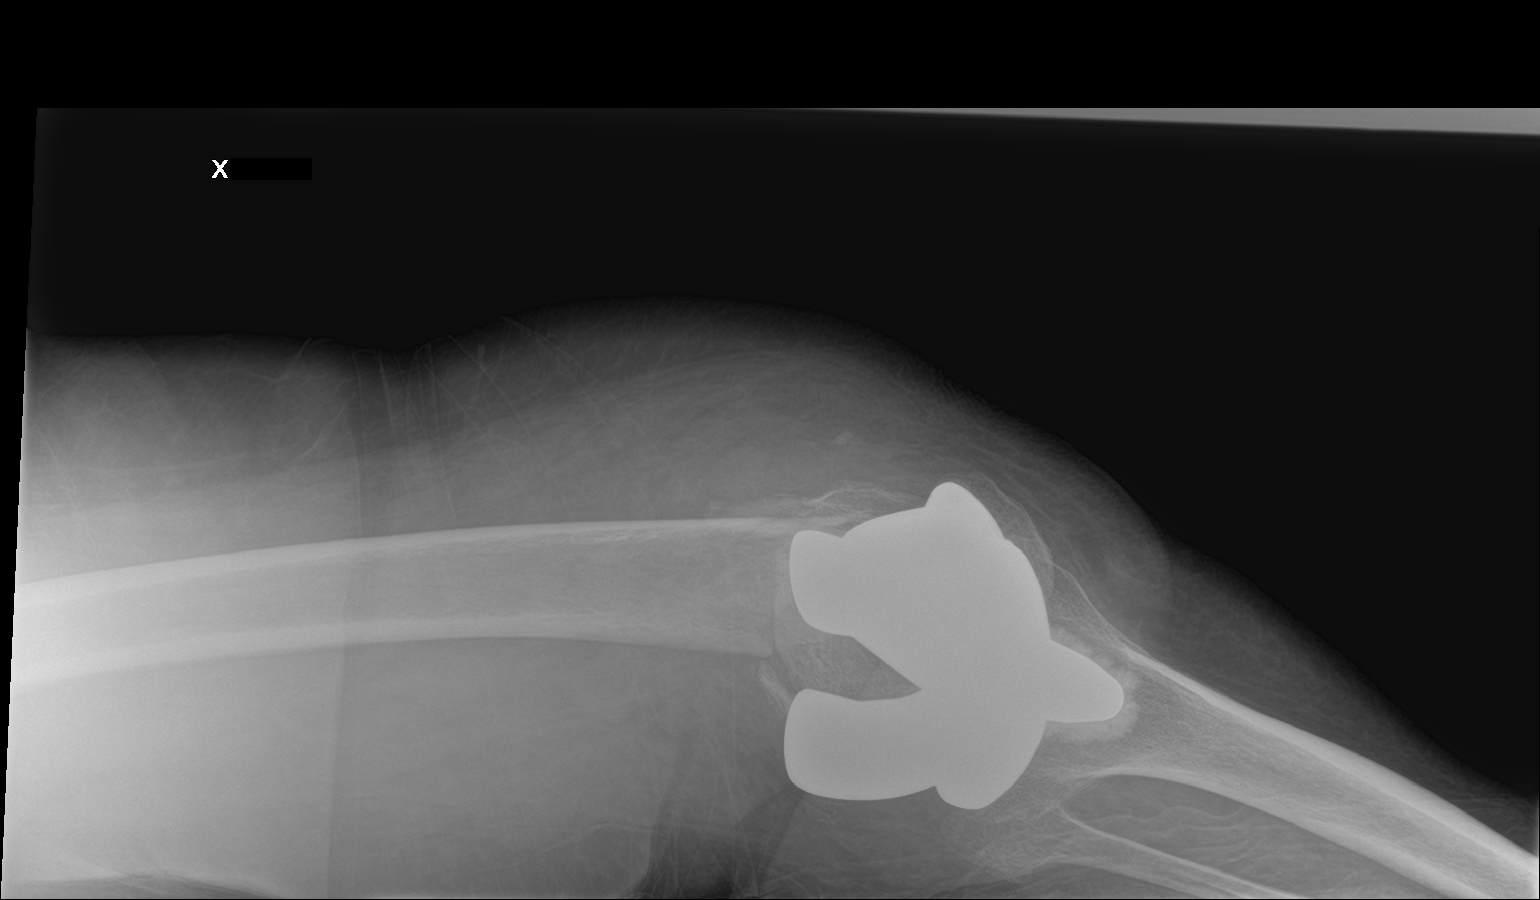

[knee lat]
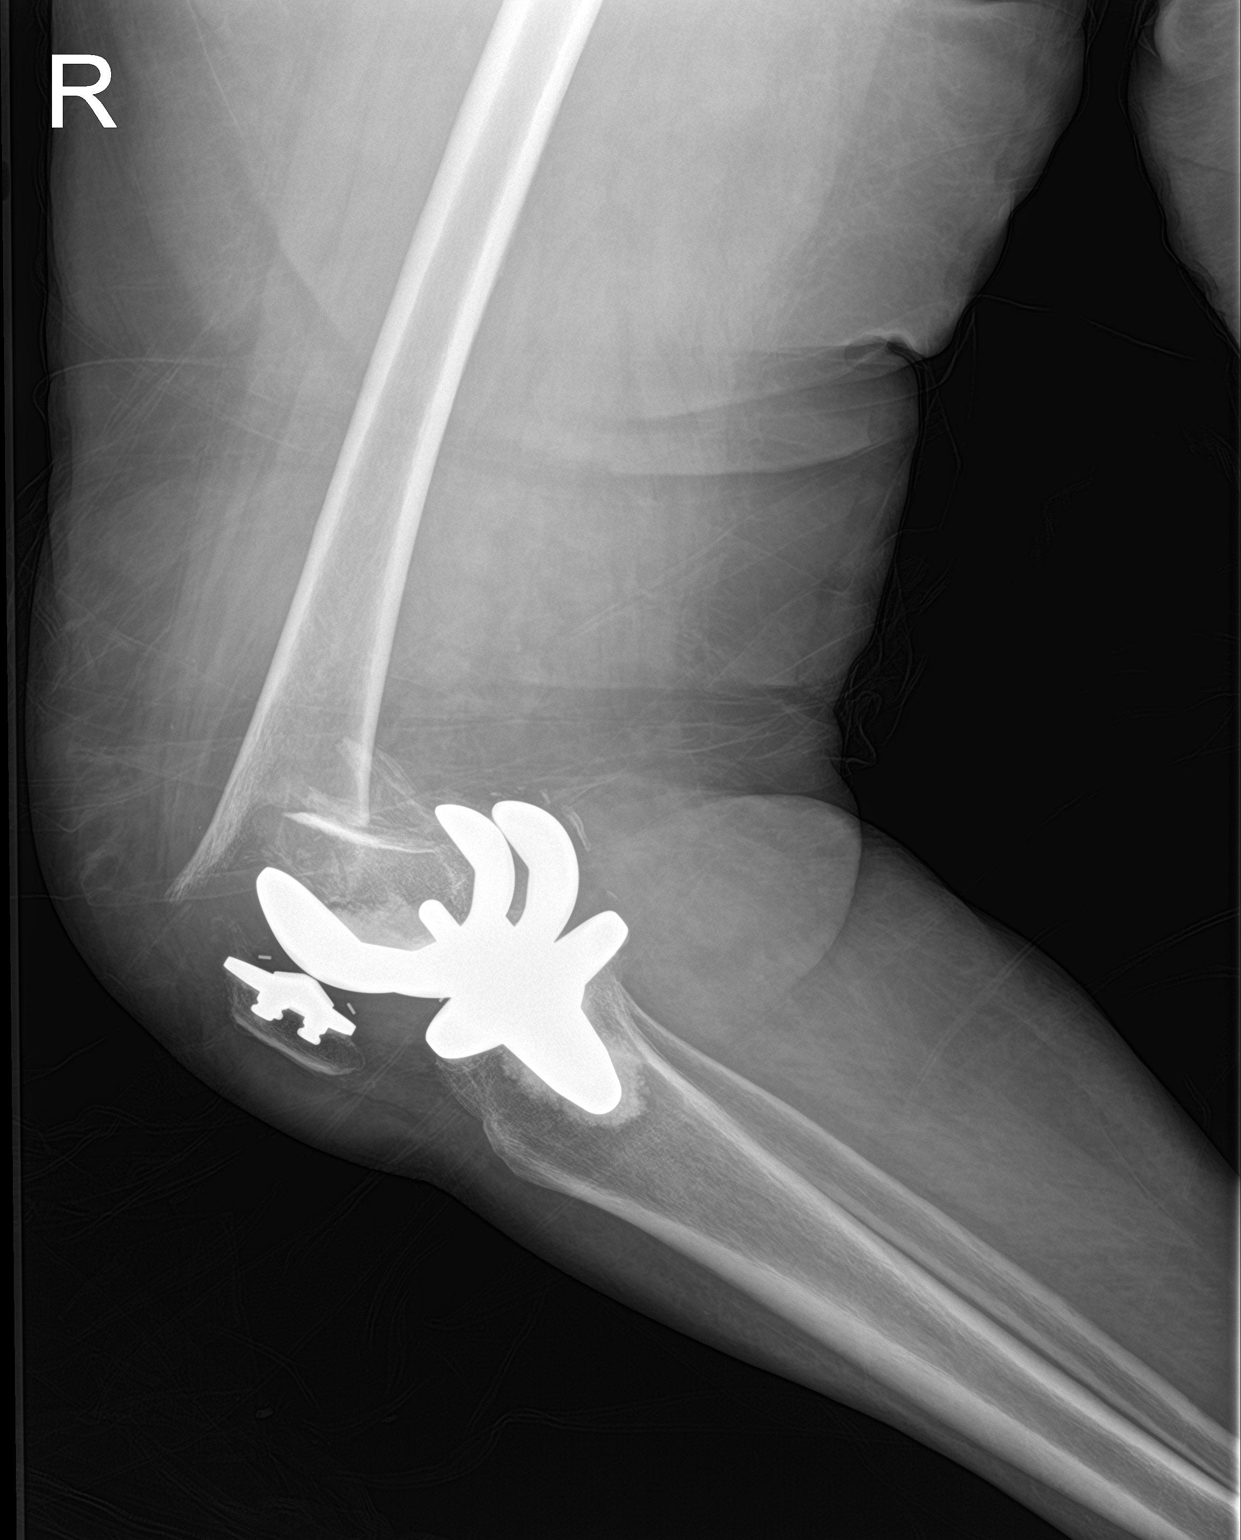

[2 of 2 positions shown; findings below may reference images not displayed]

FINDINGS: Prior right knee replacement. Acute comminuted distal femoral
periprosthetic fracture with moderate to marked apex anterior
angulation at the fracture site and about [DATE] bone with posterior
displacement of distal fracture fragment. Diffuse soft tissue
swelling.
IMPRESSION: Prior knee replacement. Acute comminuted, displaced and angulated
distal femoral periprosthetic fracture

## 2021-05-23 IMAGING — DX DG CHEST 1V PORT
1 series · 1 of 1 positions shown · non-contrast
Comparison: 01/17/2019

CLINICAL DATA: Preop fall with fracture

EXAM:
PORTABLE CHEST 1 VIEW

[chest ap]
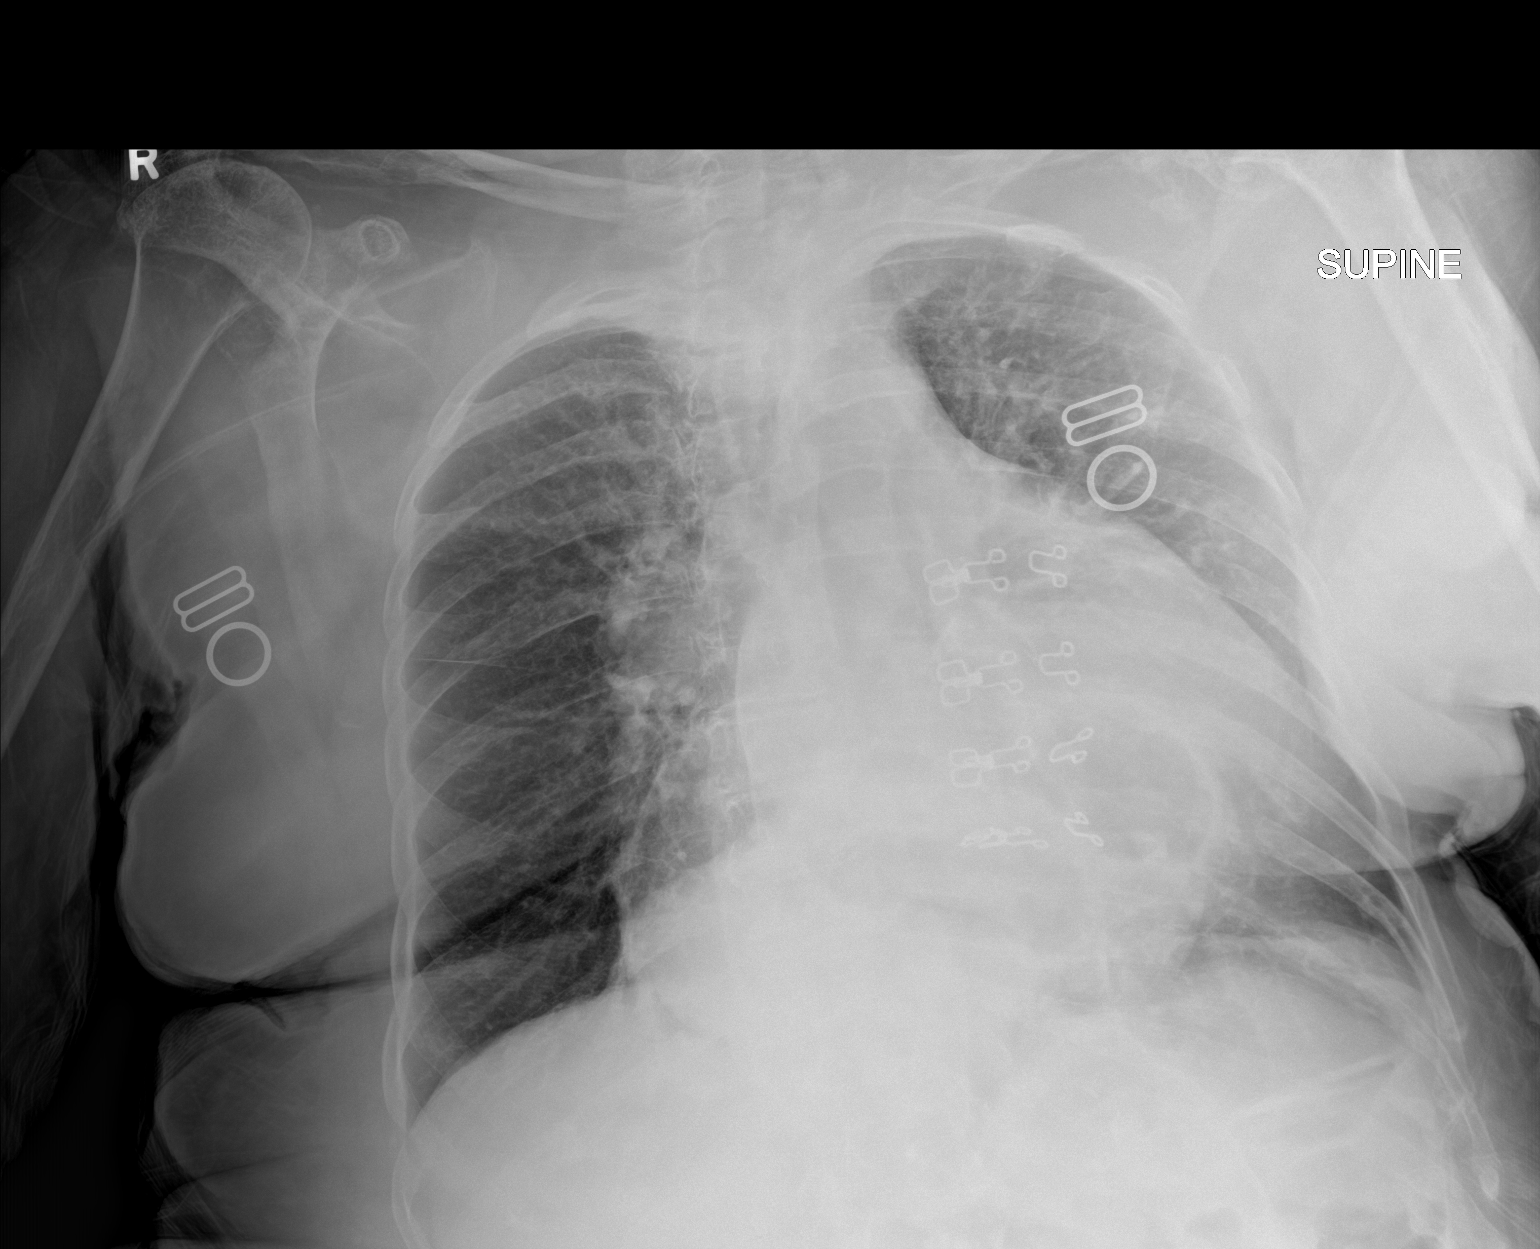

[1 of 1 positions shown; findings below may reference images not displayed]

FINDINGS: Limited by patient rotation and lordosis. The heart is slightly
enlarged. Allowing for positioning, no gross focal airspace disease,
pleural effusion or pneumothorax. Moderate retrocardiac lucent
structure, suspect for a hiatal hernia.
IMPRESSION: 1. Limited by positioning. No definite acute airspace disease is
seen
2. Suspected moderate to large hiatal hernia

## 2021-05-23 NOTE — Chronic Care Management (AMB) (Signed)
?  Chronic Care Management  ? ?Outreach Note ? ?05/23/2021 ?Name: Lydia King MRN: [id number] DOB: 1930/11/19 ? ?Referred by: Tower, Wynelle Fanny, MD ?Reason for referral : No chief complaint on file. ? ? ?An unsuccessful telephone outreach was attempted today. The patient was referred to the pharmacist for assistance with care management and care coordination.  ? ?Follow Up Plan: REFERRAL/ PLAN N ? ?Lydia King ?Upstream Scheduler  ?

## 2021-05-24 DIAGNOSIS — Z1159 Encounter for screening for other viral diseases: Secondary | ICD-10-CM | POA: Diagnosis not present

## 2021-05-24 DIAGNOSIS — Z741 Need for assistance with personal care: Secondary | ICD-10-CM | POA: Diagnosis not present

## 2021-05-24 DIAGNOSIS — G600 Hereditary motor and sensory neuropathy: Secondary | ICD-10-CM | POA: Diagnosis not present

## 2021-05-24 DIAGNOSIS — M6281 Muscle weakness (generalized): Secondary | ICD-10-CM | POA: Diagnosis not present

## 2021-05-27 DIAGNOSIS — M6281 Muscle weakness (generalized): Secondary | ICD-10-CM | POA: Diagnosis not present

## 2021-05-27 DIAGNOSIS — G600 Hereditary motor and sensory neuropathy: Secondary | ICD-10-CM | POA: Diagnosis not present

## 2021-05-27 DIAGNOSIS — Z741 Need for assistance with personal care: Secondary | ICD-10-CM | POA: Diagnosis not present

## 2021-05-28 ENCOUNTER — Telehealth: Payer: Self-pay | Admitting: Family Medicine

## 2021-05-28 DIAGNOSIS — M6281 Muscle weakness (generalized): Secondary | ICD-10-CM | POA: Diagnosis not present

## 2021-05-28 DIAGNOSIS — G600 Hereditary motor and sensory neuropathy: Secondary | ICD-10-CM | POA: Diagnosis not present

## 2021-05-28 DIAGNOSIS — Z741 Need for assistance with personal care: Secondary | ICD-10-CM | POA: Diagnosis not present

## 2021-05-28 NOTE — Progress Notes (Signed)
?  Chronic Care Management  ? ?Outreach Note ? ?05/28/2021 ?Name: Lydia King MRN: [id number] DOB: Mar 12, 1930 ? ?Referred by: Tower, Wynelle Fanny, MD ?Reason for referral : No chief complaint on file. ? ? ?A second unsuccessful telephone outreach was attempted today. The patient was referred to pharmacist for assistance with care management and care coordination. ? ?Follow Up Plan:  ? ?Tatjana Dellinger ?Upstream Scheduler  ?

## 2021-05-29 DIAGNOSIS — M461 Sacroiliitis, not elsewhere classified: Secondary | ICD-10-CM | POA: Diagnosis not present

## 2021-05-29 DIAGNOSIS — M6281 Muscle weakness (generalized): Secondary | ICD-10-CM | POA: Diagnosis not present

## 2021-05-29 DIAGNOSIS — G600 Hereditary motor and sensory neuropathy: Secondary | ICD-10-CM | POA: Diagnosis not present

## 2021-05-29 DIAGNOSIS — E119 Type 2 diabetes mellitus without complications: Secondary | ICD-10-CM | POA: Diagnosis not present

## 2021-05-29 DIAGNOSIS — Z741 Need for assistance with personal care: Secondary | ICD-10-CM | POA: Diagnosis not present

## 2021-05-30 DIAGNOSIS — G600 Hereditary motor and sensory neuropathy: Secondary | ICD-10-CM | POA: Diagnosis not present

## 2021-05-30 DIAGNOSIS — Z741 Need for assistance with personal care: Secondary | ICD-10-CM | POA: Diagnosis not present

## 2021-05-30 DIAGNOSIS — M6281 Muscle weakness (generalized): Secondary | ICD-10-CM | POA: Diagnosis not present

## 2021-05-31 DIAGNOSIS — Z741 Need for assistance with personal care: Secondary | ICD-10-CM | POA: Diagnosis not present

## 2021-05-31 DIAGNOSIS — M6281 Muscle weakness (generalized): Secondary | ICD-10-CM | POA: Diagnosis not present

## 2021-05-31 DIAGNOSIS — G600 Hereditary motor and sensory neuropathy: Secondary | ICD-10-CM | POA: Diagnosis not present

## 2021-06-03 ENCOUNTER — Telehealth: Payer: Self-pay | Admitting: Family Medicine

## 2021-06-03 DIAGNOSIS — G600 Hereditary motor and sensory neuropathy: Secondary | ICD-10-CM | POA: Diagnosis not present

## 2021-06-03 DIAGNOSIS — L603 Nail dystrophy: Secondary | ICD-10-CM | POA: Diagnosis not present

## 2021-06-03 DIAGNOSIS — M6281 Muscle weakness (generalized): Secondary | ICD-10-CM | POA: Diagnosis not present

## 2021-06-03 DIAGNOSIS — I739 Peripheral vascular disease, unspecified: Secondary | ICD-10-CM | POA: Diagnosis not present

## 2021-06-03 DIAGNOSIS — Z741 Need for assistance with personal care: Secondary | ICD-10-CM | POA: Diagnosis not present

## 2021-06-03 DIAGNOSIS — I10 Essential (primary) hypertension: Secondary | ICD-10-CM | POA: Diagnosis not present

## 2021-06-03 DIAGNOSIS — E119 Type 2 diabetes mellitus without complications: Secondary | ICD-10-CM | POA: Diagnosis not present

## 2021-06-03 NOTE — Chronic Care Management (AMB) (Signed)
?  Chronic Care Management  ? ?Outreach Note ? ?06/03/2021 ?Name: ALERIA MAHEU MRN: [id number] DOB: 05/27/1930 ? ?Referred by: Tower, Wynelle Fanny, MD ?Reason for referral : No chief complaint on file. ? ? ?Third unsuccessful telephone outreach was attempted today. The patient was referred to the pharmacist for assistance with care management and care coordination.  ? ?Follow Up Plan: referral ? ?Tatjana Dellinger ?Upstream Scheduler  ?

## 2021-06-04 DIAGNOSIS — Z741 Need for assistance with personal care: Secondary | ICD-10-CM | POA: Diagnosis not present

## 2021-06-04 DIAGNOSIS — M6281 Muscle weakness (generalized): Secondary | ICD-10-CM | POA: Diagnosis not present

## 2021-06-04 DIAGNOSIS — G600 Hereditary motor and sensory neuropathy: Secondary | ICD-10-CM | POA: Diagnosis not present

## 2021-06-10 ENCOUNTER — Encounter: Payer: Self-pay | Admitting: Nurse Practitioner

## 2021-06-10 ENCOUNTER — Non-Acute Institutional Stay: Payer: Medicare Other | Admitting: Nurse Practitioner

## 2021-06-10 VITALS — BP 142/70 | HR 61 | Temp 97.1°F | Resp 18 | Wt 137.1 lb

## 2021-06-10 DIAGNOSIS — R531 Weakness: Secondary | ICD-10-CM | POA: Diagnosis not present

## 2021-06-10 DIAGNOSIS — Z515 Encounter for palliative care: Secondary | ICD-10-CM

## 2021-06-10 NOTE — Progress Notes (Signed)
? ? ?Manufacturing engineer ?Community Palliative Care Consult Note ?Telephone: [phone]  ?Fax: [phone]  ? ? ?Date of encounter: 06/10/21 ?7:27 PM ?PATIENT NAME: Lydia King ?[place] [address]   ?[phone] (home)  ?DOB: August 02, 1930 ?MRN: [id number] ?PRIMARY CARE PROVIDER:    ?[facility] [employer] ? ?RESPONSIBLE PARTY:    ?Contact Information   ? ? Name Relation Home Work Mobile  ? Lydia King,Lydia King Daughter [phone]  [phone]  ? Lydia King Daughter   [phone]  ? ?  ? ?I met face to face with patient in facility. Palliative Care was asked to follow this patient by consultation request of  [place] to address advance care planning and complex medical decision making. This is a follow up visit.                                  ?ASSESSMENT AND PLAN / RECOMMENDATIONS:  ?Symptom Management/Plan: ?1. Advance Care Planning; DNR in [place]yn[place]; reviewed, wishes are to treat what is treatable, hospitalization if needed ?  ?2. Generalized weakness secondary to deconditioning, discussed importance of exercise, discussed fall risk with recent fall. We talked about importance of safety, exercise and mobility ?  ?3. Palliative care encounter; Palliative care encounter; Palliative medicine team will continue to support patient, patient's family, and medical team. Visit consisted of counseling and education dealing with the complex and emotionally intense issues of symptom management and palliative care in the setting of serious and potentially life-threatening illness ?  ?4. f/u 8 weeks for ongoing monitoring str, progression, planning for d/c plans or transition to LTC with family, goc ? ?Follow up Palliative Care Visit: Palliative care will continue to follow for complex medical decision making, advance care planning, and clarification of goals. Return 8 weeks or prn. ? ?I spent 46 minutes providing this consultation starting at 1:00 pm. More than 50% of the time  in this consultation was spent in counseling and care coordination. ?PPS: 50% ?Chief Complaint: Follow up palliative consult for complex medical decision making ? ?HISTORY OF PRESENT ILLNESS:  Lydia King is a 88 y.o. year old female  with multiple medical problems including chronic HF (EF 55% in July 2021), CAD with 2 stents placed, OSA with CPAP use, Asthma, DM2, Osteopenia, Osteoarthritis multiple joints and history of CVA with cortical blindness. [name] continues to reside in [place] at [facility]. [name] is independent for her personal hygiene. She is ambulatory with her walker. [name] feeds herself with good appetite. At present [name] is lying in bed. Appears comfortable. No visitors present. [name] and I talked about PC visit. We talked about symptoms, how she has been feeling. [name] talked about a fall she had last week in her room, hitting her head. She talked about aside from the headache she has which has now resided, no further complaints. We talked about appetite, nutrition, ros. We talked about residing at facility, her roommate she is close with who went home with her daughter for the weekend. We revisited talking about the birds outside their window. No recent hospitalizations, wounds. Medical goals reviewed. We talked about quality of life, facility, medical goals. I attempted to contact [name] daughter [name] for update. Therapeutic listening, emotional support provided. Questions answered. Will continue current poc, [name] appears stable ?History obtained from review of EMR, discussion with facility staff and  [name].  ?I reviewed available labs, medications, imaging, studies and related documents from the EMR.  Records reviewed and summarized above.  ?ROS ?10 point system reviewed all negative except HPI ?Physical Exam: ?Constitutional: NAD ?General: frail appearing, thin, pleasant  female ?EYES: lids intact ?ENMT: oral mucous membranes moist ?CV: S1S2, RRR, no LE edema ?Pulmonary: LCTA, no increased work of breathing, no cough, room air ?Abdomen:  normo-active BS + 4 quadrants, soft and non tender ?MSK: ambulatory with walker ?Skin: warm and dry ?Neuro:  no generalized weakness,  no cognitive impairment ?Psych: non-anxious affect, A and O x 3 ?Thank you for the opportunity to participate in the care of [name].  The palliative care team will continue to follow. Please call our office at [phone] if we can be of additional assistance.  ? ?Jaclynne Baldo Z Keymani Glynn, NP   ?

## 2021-06-27 DIAGNOSIS — F5105 Insomnia due to other mental disorder: Secondary | ICD-10-CM | POA: Diagnosis not present

## 2021-06-27 DIAGNOSIS — F331 Major depressive disorder, recurrent, moderate: Secondary | ICD-10-CM | POA: Diagnosis not present

## 2021-06-27 DIAGNOSIS — F411 Generalized anxiety disorder: Secondary | ICD-10-CM | POA: Diagnosis not present

## 2021-07-04 DIAGNOSIS — M545 Low back pain, unspecified: Secondary | ICD-10-CM | POA: Diagnosis not present

## 2021-07-04 DIAGNOSIS — Z760 Encounter for issue of repeat prescription: Secondary | ICD-10-CM | POA: Diagnosis not present

## 2021-07-05 DIAGNOSIS — E119 Type 2 diabetes mellitus without complications: Secondary | ICD-10-CM | POA: Diagnosis not present

## 2021-07-09 DIAGNOSIS — E119 Type 2 diabetes mellitus without complications: Secondary | ICD-10-CM | POA: Diagnosis not present

## 2021-08-02 DIAGNOSIS — N6459 Other signs and symptoms in breast: Secondary | ICD-10-CM | POA: Diagnosis not present

## 2021-08-06 DIAGNOSIS — K219 Gastro-esophageal reflux disease without esophagitis: Secondary | ICD-10-CM | POA: Diagnosis not present

## 2021-08-06 DIAGNOSIS — E119 Type 2 diabetes mellitus without complications: Secondary | ICD-10-CM | POA: Diagnosis not present

## 2021-08-06 DIAGNOSIS — I10 Essential (primary) hypertension: Secondary | ICD-10-CM | POA: Diagnosis not present

## 2021-08-09 ENCOUNTER — Telehealth: Payer: Self-pay | Admitting: Cardiovascular Disease

## 2021-08-09 NOTE — Telephone Encounter (Signed)
3 attempts to schedule fu appt from recall list.   Deleting recall.   

## 2021-08-12 ENCOUNTER — Other Ambulatory Visit: Payer: Self-pay | Admitting: Nurse Practitioner

## 2021-08-14 ENCOUNTER — Other Ambulatory Visit: Payer: Self-pay | Admitting: Nurse Practitioner

## 2021-08-14 DIAGNOSIS — Z1231 Encounter for screening mammogram for malignant neoplasm of breast: Secondary | ICD-10-CM

## 2021-08-14 DIAGNOSIS — R928 Other abnormal and inconclusive findings on diagnostic imaging of breast: Secondary | ICD-10-CM

## 2021-08-14 DIAGNOSIS — N6459 Other signs and symptoms in breast: Secondary | ICD-10-CM

## 2021-08-21 DIAGNOSIS — F32A Depression, unspecified: Secondary | ICD-10-CM | POA: Diagnosis not present

## 2021-08-28 DIAGNOSIS — E1151 Type 2 diabetes mellitus with diabetic peripheral angiopathy without gangrene: Secondary | ICD-10-CM | POA: Diagnosis not present

## 2021-08-28 DIAGNOSIS — B351 Tinea unguium: Secondary | ICD-10-CM | POA: Diagnosis not present

## 2021-08-28 DIAGNOSIS — Z7984 Long term (current) use of oral hypoglycemic drugs: Secondary | ICD-10-CM | POA: Diagnosis not present

## 2021-08-28 DIAGNOSIS — M2041 Other hammer toe(s) (acquired), right foot: Secondary | ICD-10-CM | POA: Diagnosis not present

## 2021-08-28 DIAGNOSIS — M2042 Other hammer toe(s) (acquired), left foot: Secondary | ICD-10-CM | POA: Diagnosis not present

## 2021-08-28 DIAGNOSIS — L603 Nail dystrophy: Secondary | ICD-10-CM | POA: Diagnosis not present

## 2021-08-29 DIAGNOSIS — F5105 Insomnia due to other mental disorder: Secondary | ICD-10-CM | POA: Diagnosis not present

## 2021-08-29 DIAGNOSIS — F331 Major depressive disorder, recurrent, moderate: Secondary | ICD-10-CM | POA: Diagnosis not present

## 2021-08-29 DIAGNOSIS — F411 Generalized anxiety disorder: Secondary | ICD-10-CM | POA: Diagnosis not present

## 2021-08-30 ENCOUNTER — Ambulatory Visit
Admission: RE | Admit: 2021-08-30 | Discharge: 2021-08-30 | Disposition: A | Discharge status: Home / Self Care | Payer: Medicare Other | Source: Ambulatory Visit | Attending: Nurse Practitioner | Admitting: Nurse Practitioner

## 2021-08-30 DIAGNOSIS — R928 Other abnormal and inconclusive findings on diagnostic imaging of breast: Secondary | ICD-10-CM | POA: Insufficient documentation

## 2021-08-30 DIAGNOSIS — Z1231 Encounter for screening mammogram for malignant neoplasm of breast: Secondary | ICD-10-CM | POA: Diagnosis not present

## 2021-08-30 DIAGNOSIS — N6459 Other signs and symptoms in breast: Secondary | ICD-10-CM

## 2021-08-30 DIAGNOSIS — N6453 Retraction of nipple: Secondary | ICD-10-CM | POA: Diagnosis not present

## 2021-09-06 DIAGNOSIS — Z760 Encounter for issue of repeat prescription: Secondary | ICD-10-CM | POA: Diagnosis not present

## 2021-09-06 DIAGNOSIS — G8929 Other chronic pain: Secondary | ICD-10-CM | POA: Diagnosis not present

## 2021-09-09 DIAGNOSIS — I5040 Unspecified combined systolic (congestive) and diastolic (congestive) heart failure: Secondary | ICD-10-CM | POA: Diagnosis not present

## 2021-09-09 DIAGNOSIS — M25511 Pain in right shoulder: Secondary | ICD-10-CM | POA: Diagnosis not present

## 2021-09-09 DIAGNOSIS — F32A Depression, unspecified: Secondary | ICD-10-CM | POA: Diagnosis not present

## 2021-09-09 DIAGNOSIS — M6281 Muscle weakness (generalized): Secondary | ICD-10-CM | POA: Diagnosis not present

## 2021-09-09 DIAGNOSIS — E785 Hyperlipidemia, unspecified: Secondary | ICD-10-CM | POA: Diagnosis not present

## 2021-09-09 DIAGNOSIS — I10 Essential (primary) hypertension: Secondary | ICD-10-CM | POA: Diagnosis not present

## 2021-09-09 DIAGNOSIS — K219 Gastro-esophageal reflux disease without esophagitis: Secondary | ICD-10-CM | POA: Diagnosis not present

## 2021-09-09 DIAGNOSIS — M545 Low back pain, unspecified: Secondary | ICD-10-CM | POA: Diagnosis not present

## 2021-09-09 DIAGNOSIS — I2510 Atherosclerotic heart disease of native coronary artery without angina pectoris: Secondary | ICD-10-CM | POA: Diagnosis not present

## 2021-09-09 DIAGNOSIS — G43909 Migraine, unspecified, not intractable, without status migrainosus: Secondary | ICD-10-CM | POA: Diagnosis not present

## 2021-09-10 ENCOUNTER — Other Ambulatory Visit: Payer: Self-pay

## 2021-09-10 ENCOUNTER — Emergency Department
Admission: EM | Admit: 2021-09-10 | Discharge: 2021-09-10 | Disposition: A | Discharge status: Home / Self Care | Payer: Medicare Other | Attending: Emergency Medicine | Admitting: Emergency Medicine

## 2021-09-10 ENCOUNTER — Emergency Department: Payer: Medicare Other

## 2021-09-10 ENCOUNTER — Encounter: Payer: Self-pay | Admitting: Emergency Medicine

## 2021-09-10 ENCOUNTER — Telehealth: Payer: Self-pay | Admitting: Family Medicine

## 2021-09-10 DIAGNOSIS — M25521 Pain in right elbow: Secondary | ICD-10-CM | POA: Diagnosis not present

## 2021-09-10 DIAGNOSIS — G8929 Other chronic pain: Secondary | ICD-10-CM | POA: Diagnosis not present

## 2021-09-10 DIAGNOSIS — Y9301 Activity, walking, marching and hiking: Secondary | ICD-10-CM | POA: Insufficient documentation

## 2021-09-10 DIAGNOSIS — M5030 Other cervical disc degeneration, unspecified cervical region: Secondary | ICD-10-CM | POA: Diagnosis not present

## 2021-09-10 DIAGNOSIS — M25551 Pain in right hip: Secondary | ICD-10-CM | POA: Diagnosis not present

## 2021-09-10 DIAGNOSIS — I5032 Chronic diastolic (congestive) heart failure: Secondary | ICD-10-CM | POA: Insufficient documentation

## 2021-09-10 DIAGNOSIS — M25561 Pain in right knee: Secondary | ICD-10-CM | POA: Insufficient documentation

## 2021-09-10 DIAGNOSIS — Z043 Encounter for examination and observation following other accident: Secondary | ICD-10-CM | POA: Diagnosis not present

## 2021-09-10 DIAGNOSIS — W01198A Fall on same level from slipping, tripping and stumbling with subsequent striking against other object, initial encounter: Secondary | ICD-10-CM | POA: Diagnosis not present

## 2021-09-10 DIAGNOSIS — Z96651 Presence of right artificial knee joint: Secondary | ICD-10-CM | POA: Diagnosis not present

## 2021-09-10 DIAGNOSIS — Y92129 Unspecified place in nursing home as the place of occurrence of the external cause: Secondary | ICD-10-CM | POA: Insufficient documentation

## 2021-09-10 DIAGNOSIS — M4319 Spondylolisthesis, multiple sites in spine: Secondary | ICD-10-CM | POA: Diagnosis not present

## 2021-09-10 DIAGNOSIS — M6281 Muscle weakness (generalized): Secondary | ICD-10-CM | POA: Diagnosis not present

## 2021-09-10 DIAGNOSIS — M25571 Pain in right ankle and joints of right foot: Secondary | ICD-10-CM | POA: Insufficient documentation

## 2021-09-10 DIAGNOSIS — M25511 Pain in right shoulder: Secondary | ICD-10-CM | POA: Diagnosis not present

## 2021-09-10 DIAGNOSIS — I2510 Atherosclerotic heart disease of native coronary artery without angina pectoris: Secondary | ICD-10-CM | POA: Insufficient documentation

## 2021-09-10 DIAGNOSIS — M25531 Pain in right wrist: Secondary | ICD-10-CM | POA: Insufficient documentation

## 2021-09-10 DIAGNOSIS — W19XXXA Unspecified fall, initial encounter: Secondary | ICD-10-CM

## 2021-09-10 DIAGNOSIS — E119 Type 2 diabetes mellitus without complications: Secondary | ICD-10-CM | POA: Insufficient documentation

## 2021-09-10 DIAGNOSIS — M25559 Pain in unspecified hip: Secondary | ICD-10-CM | POA: Diagnosis not present

## 2021-09-10 DIAGNOSIS — Z471 Aftercare following joint replacement surgery: Secondary | ICD-10-CM | POA: Diagnosis not present

## 2021-09-10 DIAGNOSIS — M545 Low back pain, unspecified: Secondary | ICD-10-CM | POA: Insufficient documentation

## 2021-09-10 DIAGNOSIS — I6381 Other cerebral infarction due to occlusion or stenosis of small artery: Secondary | ICD-10-CM | POA: Diagnosis not present

## 2021-09-10 DIAGNOSIS — S0990XA Unspecified injury of head, initial encounter: Secondary | ICD-10-CM | POA: Diagnosis not present

## 2021-09-10 DIAGNOSIS — M25569 Pain in unspecified knee: Secondary | ICD-10-CM | POA: Diagnosis not present

## 2021-09-10 MED ORDER — LIDOCAINE 5 % EX PTCH
1.0000 | MEDICATED_PATCH | CUTANEOUS | Status: DC
Start: 2021-09-10 — End: 2021-09-11
  Administered 2021-09-10: 1 via TRANSDERMAL
  Filled 2021-09-10: qty 1

## 2021-09-10 MED ORDER — LIDOCAINE 5 % EX PTCH: 1.0000 | MEDICATED_PATCH | Freq: Two times a day (BID) | CUTANEOUS | 0 refills | Status: AC

## 2021-09-10 MED ORDER — HYDROCODONE-ACETAMINOPHEN 5-325 MG PO TABS
1.0000 | ORAL_TABLET | Freq: Once | ORAL | Status: AC
  Administered 2021-09-10: 1 via ORAL
  Filled 2021-09-10: qty 1

## 2021-09-10 NOTE — ED Notes (Signed)
Patient in MRI 

## 2021-09-10 NOTE — Telephone Encounter (Signed)
Patient's daughter [name] is calling [phone])  Her mother fell 78 minutes ago and bumped her head  [name] stated that Dr. Glori Bickers had recommended MRI next time patient fell to check for brain bleed  Patient is [facility] on [address]  Transferred call to access nurse

## 2021-09-10 NOTE — Telephone Encounter (Signed)
Patient went to ED  PLEASE NOTE: All timestamps contained within this report are represented as [place] Standard Time. CONFIDENTIALTY NOTICE: This fax transmission is intended only for the addressee. It contains information that is legally privileged, confidential or otherwise protected from use or disclosure. If you are not the intended recipient, you are strictly prohibited from reviewing, disclosing, copying using or disseminating any of this information or taking any action in reliance on or regarding this information. If you have received this fax in error, please notify us immediately by telephone so that we can arrange for its return to [place]. Phone: [phone], Toll-Free: [phone], Fax: [phone] Page: 1 of 2 Call Id: [id number] [facility] - Client TELEPHONE ADVICE RECORD AccessNurse Patient Name: Lydia King [facility] Gender: Female DOB: 1930-11-05 Age: 88 Y 22 D Return Phone Number: [phone] (Primary) Address: City/ State/ Zip: [place]  [address] Client [place] [facility] - Client Client Site [place] Provider Glori Bickers, Roque Lias - MD Contact Type Call Who Is Calling Patient / Member / Family / Caregiver Call Type Triage / Clinical Caller Name Sandi Mariscal Relationship To Patient Daughter Return Phone Number [phone] (Primary) Chief Complaint HEAD INJURY - and not acting right. Change in behaviour after hitting head. Reason for Call Symptomatic / Request for [place] states she says her mom fell, hit her head, wrist pain and twisted ankle. She has a bump on her head. Translation No No Triage Reason Patient declined Nurse Assessment Nurse: Eugenio Hoes, RN, Jenny Reichmann Date/Time (Eastern Time): 09/10/2021 1:43:45 PM Confirm and document reason for call. If symptomatic, describe symptoms. ---Caller states that her mother fell and hit her head. Caller states that  patient is having pain to her knee, wrist pain, and ankle pain. Caller states during the call that her mother's nurse is there with them now and was advised to go to the ED if patient chooses. Caller states that she will be taking her mother to the ED to be seen and says that they are covered. Does the patient have any new or worsening symptoms? ---Yes Will a triage be completed? ---No Select reason for no triage. ---Patient declined Disp. Time Eilene Ghazi Time) Disposition Final User 09/10/2021 1:41:24 PM Send to Urgent Joen Laura, Arianna 09/10/2021 1:46:33 PM Clinical Call Yes Eugenio Hoes RN, Jenny Reichmann Final Disposition 09/10/2021 1:46:33 PM Clinical Call Yes Eugenio Hoes, RN, Jenny Reichmann PLEASE NOTE: All timestamps contained within this report are represented as [place] Standard Time. CONFIDENTIALTY NOTICE: This fax transmission is intended only for the addressee. It contains information that is legally privileged, confidential or otherwise protected from use or disclosure. If you are not the intended recipient, you are strictly prohibited from reviewing, disclosing, copying using or disseminating any of this information or taking any action in reliance on or regarding this information. If you have received this fax in error, please notify us immediately by telephone so that we can arrange for its return to [place]. Phone: [phone], Toll-Free: [phone], Fax: [phone] Page: 2 of 2 Call Id: [id number]

## 2021-09-10 NOTE — Discharge Instructions (Signed)
Your CT scans and x-rays were normal.  Please return for any new, worsening, or change in symptoms or other concerns.  You may also use the Lidoderm patches for pain as we discussed.

## 2021-09-10 NOTE — ED Provider Notes (Signed)
[facility] Provider Note    Event Date/Time   First MD Initiated Contact with Patient 09/10/21 1716     (approximate)   History   Fall   HPI  Lydia King is a 88 y.o. female with a past medical history of CHF, CAD, vertigo, hyperlipidemia, poor balance, CVA, type 2 diabetes who presents today for evaluation after mechanical fall.  She presents with her daughter.  Daughter reports that she has Charcot-Marie-Tooth disease and often drags her right leg.  She tripped while walking with her walker today, at her assisted living facility.  This was witnessed by the staff at the assisted living facility.  There was head strike but no LOC.  Patient denies any preceding events.  She reports that she currently has pain in her low back, her right shoulder, right elbow, right ankle, right knee, right hip.  No neck pain or headache.  No vomiting or vision changes.  She was able to ambulate prior to arrival as witnessed by her daughter who is currently at bedside.  She denies any numbness or tingling.  Patient Active Problem List   Diagnosis Date Noted   Mouth ulcers 05/19/2021   Dysphagia 10/08/2020   Pain due to onychomycosis of toenails of both feet 11/24/2019   Anxiety 11/09/2019   Chronic diastolic CHF (congestive heart failure) ([place]) 11/03/2019   Hiatal hernia    Chest pain 11/02/2019   CAD (coronary artery disease) 08/01/2019   Anemia 08/01/2019   Acute on chronic congestive heart failure ([place]) 07/31/2019   Acute decompensated heart failure ([place]) 07/03/2019   Bradycardia 06/17/2019   Hypoalbuminemia 06/17/2019   Aortic valve stenosis 06/15/2019   Mitral valve regurgitation 06/15/2019   Positive cardiac stress test 06/13/2019   Periprosthetic fracture around internal prosthetic right knee joint 03/15/2019   Hyperlipidemia associated with type 2 diabetes mellitus ([place]) 07/27/2018   Sinus congestion 02/05/2018   Vertigo 05/06/2017   OSA (obstructive sleep  apnea) 01/02/2016   Pedal edema 01/02/2016   Fall in home 12/11/2015   Poor balance 12/11/2015   Low back pain with sciatica 10/02/2015   Right hip pain 10/02/2015   Hearing loss 04/18/2015   Encounter for Medicare annual wellness exam 10/04/2013   Type 2 diabetes mellitus without complications ([place]) 83/38/2505   History of CVA (cerebrovascular accident) 09/12/2008   Osteoarthritis of multiple joints 05/30/2008   Osteoporosis 05/30/2008   Vitamin D deficiency 07/14/2007   Sleep apnea 07/14/2007   Essential hypertension 11/30/2006   Asthma 11/30/2006   H/O gastroesophageal reflux (GERD) 11/30/2006   G I BLEED 11/30/2006   DEEP VENOUS THROMBOPHLEBITIS, HX OF 11/30/2006          Physical Exam   Triage Vital Signs: ED Triage Vitals  Enc Vitals Group     BP 09/10/21 1626 (!) 103/55     Pulse Rate 09/10/21 1626 82     Resp 09/10/21 1626 20     Temp 09/10/21 1626 98.5 F (36.9 C)     Temp Source 09/10/21 1626 Oral     SpO2 09/10/21 1626 94 %     Weight 09/10/21 1627 135 lb (61.2 kg)     Height 09/10/21 1627 '4\' 10"'$  (1.473 m)     Head Circumference --      Peak Flow --      Pain Score 09/10/21 1627 8     Pain Loc --      Pain Edu? --      Excl.  in [place]? --     Most recent vital signs: Vitals:   09/10/21 1626  BP: (!) 103/55  Pulse: 82  Resp: 20  Temp: 98.5 F (36.9 C)  SpO2: 94%    Physical Exam Vitals and nursing note reviewed.  Constitutional:      General: Awake and alert. No acute distress.    Appearance: Normal appearance. The patient is normal weight.  HENT:     Head: Normocephalic and atraumatic.     Mouth: Mucous membranes are moist.  Eyes:     General: PERRL. Normal EOMs        Right eye: No discharge.        Left eye: No discharge.     Conjunctiva/sclera: Conjunctivae normal.  Cardiovascular:     Rate and Rhythm: Normal rate and regular rhythm.     Pulses: Normal pulses.     Heart sounds: Normal heart sounds Pulmonary:     Effort: Pulmonary  effort is normal. No respiratory distress.     Breath sounds: Normal breath sounds.  Abdominal:     Abdomen is soft. There is no abdominal tenderness. No rebound or guarding. No distention.  No abdominal tenderness Musculoskeletal:        General: No swelling. Normal range of motion.     Cervical back: Normal range of motion and neck supple.  No midline cervical spine tenderness.  Full range of motion of neck.  Negative Spurling test.  Negative Lhermitte sign.  Normal strength and sensation in bilateral upper extremities. Normal grip strength bilaterally.  Normal intrinsic muscle function of the hand bilaterally.  Normal radial pulses bilaterally. Pelvis stable.  Able to actively flex and extend bilateral hips against resistance.  Able to flex and extend bilateral knees and ankles.  Able to plantarflex and dorsiflex at the ankle against resistance.  Normal distal pulses.  Feet are warm and well-perfused bilaterally.  Full and normal range of motion of bilateral shoulders, elbows, wrists.  Mild tenderness to palpation to right wrist without deformity.  Normal radial pulse.  Normal grip strength.  Normal intrinsic muscular function of the hands bilaterally. Skin:    General: Skin is warm and dry.     Capillary Refill: Capillary refill takes less than 2 seconds.     Findings: No rash.  Neurological:     Mental Status: The patient is awake and alert.  Neurological: GCS 15 alert and oriented x3 Normal speech, no expressive or receptive aphasia or dysarthria Cranial nerves II through XII intact Normal visual fields 5 out of 5 strength in all 4 extremities with intact sensation throughout No extremity drift Normal finger-to-nose testing, no limb or truncal ataxia     ED Results / Procedures / Treatments   Labs (all labs ordered are listed, but only abnormal results are displayed) Labs Reviewed - No data to display   EKG     RADIOLOGY I independently reviewed and interpreted imaging  and agree with radiologists findings.     PROCEDURES:  Critical Care performed:   Procedures   MEDICATIONS ORDERED IN ED: Medications  lidocaine (LIDODERM) 5 % 1 patch (1 patch Transdermal Patch Applied 09/10/21 1839)  HYDROcodone-acetaminophen (NORCO/VICODIN) 5-325 MG per tablet 1 tablet (1 tablet Oral Given 09/10/21 1631)     IMPRESSION / MDM / ASSESSMENT AND PLAN / ED COURSE  I reviewed the triage vital signs and the nursing notes.   Differential diagnosis includes, but is not limited to, fracture, dislocation, compression fracture, intracranial hemorrhage, concussion.  Patient is awake and alert, hemodynamically stable and neurologically intact.  No obvious deformity on exam.  X-rays and CT head and neck obtained in triage are without acute injury.  On my examination, patient also has lumbar spine tenderness.  She agreed to CT lumbar spine.  CT spine was negative for any acute osseous injury.  She has normal strength and sensation in bilateral lower extremities, no saddle anesthesia, no urinary or stool incontinence or retention, low suspicion for cord compression at this time.  Patient was treated symptomatically with Lidoderm patch which she felt provided significant improvement of her symptoms.  She is able to ambulate.  She feels comfortable going home.  She requested a prescription for the Lidoderm patches which was prescribed for her.  She reports that her pain has resolved.  Her daughter also feels comfortable with her discharge. Patient lives in an assisted nursing facility and has a safe place to go.  We discussed return precautions and the importance of close outpatient follow-up.  She was discharged in stable condition.  Patient's presentation is most consistent with acute complicated illness / injury requiring diagnostic workup.    Clinical Course as of 09/10/21 2119  Tue Sep 10, 2021  1915 Patient reports pain has resolved with the lidoderm patch. She and her daughter  feel ready for discharge home [JP]    Clinical Course User Index [JP] Grabiela Wohlford, Clarnce Flock, PA-C     FINAL CLINICAL IMPRESSION(S) / ED DIAGNOSES   Final diagnoses:  Fall, initial encounter  Injury of head, initial encounter  Acute bilateral low back pain without sciatica  Right wrist pain  Chronic right shoulder pain     Rx / DC Orders   ED Discharge Orders          Ordered    lidocaine (LIDODERM) 5 %  Every 12 hours        09/10/21 1921             Note:  This document was prepared using Dragon voice recognition software and may include unintentional dictation errors.   Emeline Gins 09/10/21 2119    Vanessa [place], MD 09/11/21 1343

## 2021-09-10 NOTE — ED Triage Notes (Signed)
Pt via POV from home. Pt c/o mechanical fall after trying to get into the bathroom. Denies any LOC but states she did hit the back of her head hematoma to the R side of her head. Pt c/o R shoulder, R knee, R hip, and R ankle pain. States she fell on the R side. Pt is A&Ox4 and NAD

## 2021-09-10 NOTE — ED Notes (Signed)
Patient discharged at this time. Wheeled to lobby and assisted into vehicle. Breathing unlabored speaking in full sentences. Verbalized understanding of all discharge, follow up, and medication teaching. Discharged homed with all belongings.   

## 2021-09-10 NOTE — ED Provider Triage Note (Signed)
  Emergency Medicine Provider Triage Evaluation Note  Lydia King , a 88 y.o.female,  was evaluated in triage.  Pt complains of unwitnessed fall [place].  She currently endorsing right-sided hip pain.  She is unable to stand.  Additionally has a hematoma on the right side of her head.  Endorsing right-sided shoulder/elbow pain, as well as right-sided knee and ankle pain.  No blood thinners, but does take daily aspirin.   Review of Systems  Positive: Head injury, right-sided hip/knee/elbow pain Negative: Denies fever, chest pain, vomiting  Physical Exam   Vitals:   09/10/21 1626  BP: (!) 103/55  Pulse: 82  Resp: 20  Temp: 98.5 F (36.9 C)  SpO2: 94%   Gen:   Awake, no distress   Resp:  Normal effort  MSK:   Moves extremities without difficulty.  Bony tenderness along the right knee, Other:    Medical Decision Making  Given the patient's initial medical screening exam, the following diagnostic evaluation has been ordered. The patient will be placed in the appropriate treatment space, once one is available, to complete the evaluation and treatment. I have discussed the plan of care with the patient and I have advised the patient that an ED physician or mid-level practitioner will reevaluate their condition after the test results have been received, as the results may give them additional insight into the type of treatment they may need.    Diagnostics: Head CT, cervical spine CT, right knee x-ray, right elbow x-ray, right ankle x-ray  Treatments: Hydrocodone/acetaminophen   Teodoro Spray, PA 09/10/21 1630

## 2021-09-10 NOTE — ED Triage Notes (Signed)
Arrived by EMS from [facility] for unwitnessed fall. Patient reports hearing fall. C/o right hip pain. Able to stand and pivot for EMS but cannot put all weight on right hip. Noted right side head hematoma. Right wrist and right elbow soreness. Takes daily aspirin.  116/70 b/p 92% RA 80HR

## 2021-09-11 DIAGNOSIS — R2681 Unsteadiness on feet: Secondary | ICD-10-CM | POA: Diagnosis not present

## 2021-09-11 DIAGNOSIS — G600 Hereditary motor and sensory neuropathy: Secondary | ICD-10-CM | POA: Diagnosis not present

## 2021-09-11 DIAGNOSIS — M6281 Muscle weakness (generalized): Secondary | ICD-10-CM | POA: Diagnosis not present

## 2021-09-11 DIAGNOSIS — W19XXXD Unspecified fall, subsequent encounter: Secondary | ICD-10-CM | POA: Diagnosis not present

## 2021-09-11 DIAGNOSIS — M25511 Pain in right shoulder: Secondary | ICD-10-CM | POA: Diagnosis not present

## 2021-09-11 DIAGNOSIS — E039 Hypothyroidism, unspecified: Secondary | ICD-10-CM | POA: Diagnosis not present

## 2021-09-11 DIAGNOSIS — M545 Low back pain, unspecified: Secondary | ICD-10-CM | POA: Diagnosis not present

## 2021-09-11 DIAGNOSIS — M25569 Pain in unspecified knee: Secondary | ICD-10-CM | POA: Diagnosis not present

## 2021-09-11 NOTE — Telephone Encounter (Signed)
Aware, will review ER notes

## 2021-09-11 NOTE — Telephone Encounter (Signed)
Forwarding

## 2021-09-12 DIAGNOSIS — W19XXXD Unspecified fall, subsequent encounter: Secondary | ICD-10-CM | POA: Diagnosis not present

## 2021-09-12 DIAGNOSIS — G600 Hereditary motor and sensory neuropathy: Secondary | ICD-10-CM | POA: Diagnosis not present

## 2021-09-12 DIAGNOSIS — M6281 Muscle weakness (generalized): Secondary | ICD-10-CM | POA: Diagnosis not present

## 2021-09-12 DIAGNOSIS — G8929 Other chronic pain: Secondary | ICD-10-CM | POA: Diagnosis not present

## 2021-09-12 DIAGNOSIS — M545 Low back pain, unspecified: Secondary | ICD-10-CM | POA: Diagnosis not present

## 2021-09-12 DIAGNOSIS — M25569 Pain in unspecified knee: Secondary | ICD-10-CM | POA: Diagnosis not present

## 2021-09-12 DIAGNOSIS — M25511 Pain in right shoulder: Secondary | ICD-10-CM | POA: Diagnosis not present

## 2021-09-12 DIAGNOSIS — M25551 Pain in right hip: Secondary | ICD-10-CM | POA: Diagnosis not present

## 2021-09-12 DIAGNOSIS — R2681 Unsteadiness on feet: Secondary | ICD-10-CM | POA: Diagnosis not present

## 2021-09-13 DIAGNOSIS — G600 Hereditary motor and sensory neuropathy: Secondary | ICD-10-CM | POA: Diagnosis not present

## 2021-09-13 DIAGNOSIS — M545 Low back pain, unspecified: Secondary | ICD-10-CM | POA: Diagnosis not present

## 2021-09-13 DIAGNOSIS — R2681 Unsteadiness on feet: Secondary | ICD-10-CM | POA: Diagnosis not present

## 2021-09-15 DIAGNOSIS — M79651 Pain in right thigh: Secondary | ICD-10-CM | POA: Diagnosis not present

## 2021-09-15 DIAGNOSIS — M25551 Pain in right hip: Secondary | ICD-10-CM | POA: Diagnosis not present

## 2021-09-16 DIAGNOSIS — M25569 Pain in unspecified knee: Secondary | ICD-10-CM | POA: Diagnosis not present

## 2021-09-16 DIAGNOSIS — W19XXXD Unspecified fall, subsequent encounter: Secondary | ICD-10-CM | POA: Diagnosis not present

## 2021-09-16 DIAGNOSIS — G600 Hereditary motor and sensory neuropathy: Secondary | ICD-10-CM | POA: Diagnosis not present

## 2021-09-16 DIAGNOSIS — M25551 Pain in right hip: Secondary | ICD-10-CM | POA: Diagnosis not present

## 2021-09-16 DIAGNOSIS — M545 Low back pain, unspecified: Secondary | ICD-10-CM | POA: Diagnosis not present

## 2021-09-16 DIAGNOSIS — R2681 Unsteadiness on feet: Secondary | ICD-10-CM | POA: Diagnosis not present

## 2021-09-16 DIAGNOSIS — G8929 Other chronic pain: Secondary | ICD-10-CM | POA: Diagnosis not present

## 2021-09-18 DIAGNOSIS — M25551 Pain in right hip: Secondary | ICD-10-CM | POA: Diagnosis not present

## 2021-09-18 DIAGNOSIS — M25561 Pain in right knee: Secondary | ICD-10-CM | POA: Diagnosis not present

## 2021-09-19 DIAGNOSIS — W19XXXD Unspecified fall, subsequent encounter: Secondary | ICD-10-CM | POA: Diagnosis not present

## 2021-09-19 DIAGNOSIS — M25561 Pain in right knee: Secondary | ICD-10-CM | POA: Diagnosis not present

## 2021-09-19 DIAGNOSIS — M25551 Pain in right hip: Secondary | ICD-10-CM | POA: Diagnosis not present

## 2021-09-20 DIAGNOSIS — Z79899 Other long term (current) drug therapy: Secondary | ICD-10-CM | POA: Diagnosis not present

## 2021-09-20 DIAGNOSIS — M25561 Pain in right knee: Secondary | ICD-10-CM | POA: Diagnosis not present

## 2021-09-20 DIAGNOSIS — M25551 Pain in right hip: Secondary | ICD-10-CM | POA: Diagnosis not present

## 2021-09-24 DIAGNOSIS — M25561 Pain in right knee: Secondary | ICD-10-CM | POA: Diagnosis not present

## 2021-09-24 DIAGNOSIS — M25551 Pain in right hip: Secondary | ICD-10-CM | POA: Diagnosis not present

## 2021-10-03 DIAGNOSIS — F411 Generalized anxiety disorder: Secondary | ICD-10-CM | POA: Diagnosis not present

## 2021-10-03 DIAGNOSIS — F331 Major depressive disorder, recurrent, moderate: Secondary | ICD-10-CM | POA: Diagnosis not present

## 2021-10-03 DIAGNOSIS — F5105 Insomnia due to other mental disorder: Secondary | ICD-10-CM | POA: Diagnosis not present

## 2021-10-08 DIAGNOSIS — K219 Gastro-esophageal reflux disease without esophagitis: Secondary | ICD-10-CM | POA: Diagnosis not present

## 2021-10-08 DIAGNOSIS — M25561 Pain in right knee: Secondary | ICD-10-CM | POA: Diagnosis not present

## 2021-10-08 DIAGNOSIS — M6281 Muscle weakness (generalized): Secondary | ICD-10-CM | POA: Diagnosis not present

## 2021-10-08 DIAGNOSIS — I10 Essential (primary) hypertension: Secondary | ICD-10-CM | POA: Diagnosis not present

## 2021-10-11 ENCOUNTER — Non-Acute Institutional Stay: Payer: Medicare Other | Admitting: Nurse Practitioner

## 2021-10-11 ENCOUNTER — Encounter: Payer: Self-pay | Admitting: Nurse Practitioner

## 2021-10-11 VITALS — BP 158/72 | HR 75 | Wt 136.2 lb

## 2021-10-11 DIAGNOSIS — Z515 Encounter for palliative care: Secondary | ICD-10-CM

## 2021-10-11 DIAGNOSIS — R531 Weakness: Secondary | ICD-10-CM

## 2021-10-11 DIAGNOSIS — I5032 Chronic diastolic (congestive) heart failure: Secondary | ICD-10-CM | POA: Diagnosis not present

## 2021-10-11 NOTE — Progress Notes (Signed)
[place] Consult Note Telephone: [phone]  Fax: [phone]    Date of encounter: 10/11/21 2:26 PM PATIENT NAME: Lydia King [id number]   [phone] (home)  DOB: Jan 02, 1931 MRN: [id number] PRIMARY CARE PROVIDER:    [facility]  RESPONSIBLE PARTY:    Contact Information     Name Relation Home Work Mobile   [place] Daughter [phone]  [phone]   [name] Daughter   [phone]       I met face to face with patient in facility. Palliative Care was asked to follow this patient by consultation request of  [place] to address advance care planning and complex medical decision making. This is a follow up visit.                                  ASSESSMENT AND PLAN / RECOMMENDATIONS:  Symptom Management/Plan: 1. Advance Care Planning; DNR in [place]; reviewed, wishes are to treat what is treatable, hospitalization if needed   2. Generalized weakness secondary to CHF, stable, discussed importance of exercise, discussed fall risk with recent fall. We talked about importance of safety, exercise and mobility   3. Palliative care encounter; Palliative care encounter; Palliative medicine team will continue to support patient, patient's family, and medical team. Visit consisted of counseling and education dealing with the complex and emotionally intense issues of symptom management and palliative care in the setting of serious and potentially life-threatening illness   4. f/u 8 weeks for ongoing monitoring chronic disease management, supportive care, symptoms   Follow up Palliative Care Visit: Palliative care will continue to follow for complex medical decision making, advance care planning, and clarification of goals. Return 8 weeks or prn.   I spent 45 minutes providing this consultation starting at 1:30 pm. More than 50% of the time in this consultation was  spent in counseling and care coordination. PPS: 50% Chief Complaint: Follow up palliative consult for complex medical decision making   HISTORY OF PRESENT ILLNESS:  Lydia King is a 88 y.o. year old female  with multiple medical problems including chronic HF (EF 55% in July 2021), CAD with 2 stents placed, OSA with CPAP use, Asthma, DM2, Osteopenia, Osteoarthritis multiple joints and history of CVA with cortical blindness. [name] continues to reside in [place] at [facility]. [name] is independent for her personal hygiene. She is ambulatory with her walker. Ms. Avans feeds herself with good appetite. At present Ms. Brocious is lying in bed. Appears comfortable reading a book. Ms. Lavey talked about how she has been feeling increase in arthritis pain right shoulder, right knee, right hip though pain managed with current pain regimen. Ms. Breault declined PT for pain modality. [name] endorses the last time she tried PT she was not able to walk for a few days. We talked about ED visit. We talked about falls. We talked about ros, appetite is good, nutrition. No other changes. We talked about quality of life, the book she was reading, Most PC visit supportive care, symptoms currently managed by primary and controlled with current regimen. Medical goals reviewed. We talked about quality of life, facility, medical goals. I attempted to contact Ms. Mccorvey daughter [name] for update. Therapeutic listening, emotional support provided. Questions answered. Will continue current poc, [name] appears stable History obtained from review  of EMR, discussion with facility staff and Ms. Alsop.  I reviewed available labs, medications, imaging, studies and related documents from the EMR.  Records reviewed and summarized above.  ROS 10 point system reviewed all negative except HPI Physical Exam: Constitutional: NAD General: frail  appearing, pleasant female EYES: lids intact ENMT: oral mucous membranes moist CV: S1S2, RRR, no LE edema Pulmonary: LCTA, no increased work of breathing, no cough, room air Abdomen:  normo-active BS + 4 quadrants, soft and non tender MSK: ambulatory with walker Skin: warm and dry Neuro:  no generalized weakness,  no cognitive impairment Psych: non-anxious affect, A and O x 3  Thank you for the opportunity to participate in the care of Ms. Demetriou.  The palliative care team will continue to follow. Please call our office at [phone] if we can be of additional assistance.   Tyson Masin Ihor Gully, NP

## 2021-10-22 DIAGNOSIS — D649 Anemia, unspecified: Secondary | ICD-10-CM | POA: Diagnosis not present

## 2021-10-22 DIAGNOSIS — G4733 Obstructive sleep apnea (adult) (pediatric): Secondary | ICD-10-CM | POA: Diagnosis not present

## 2021-10-22 DIAGNOSIS — E119 Type 2 diabetes mellitus without complications: Secondary | ICD-10-CM | POA: Diagnosis not present

## 2021-10-22 DIAGNOSIS — G600 Hereditary motor and sensory neuropathy: Secondary | ICD-10-CM | POA: Diagnosis not present

## 2021-10-24 DIAGNOSIS — I10 Essential (primary) hypertension: Secondary | ICD-10-CM | POA: Diagnosis not present

## 2021-10-24 DIAGNOSIS — E119 Type 2 diabetes mellitus without complications: Secondary | ICD-10-CM | POA: Diagnosis not present

## 2021-10-24 DIAGNOSIS — K120 Recurrent oral aphthae: Secondary | ICD-10-CM | POA: Diagnosis not present

## 2021-10-25 DIAGNOSIS — E119 Type 2 diabetes mellitus without complications: Secondary | ICD-10-CM | POA: Diagnosis not present

## 2021-10-28 DIAGNOSIS — F5105 Insomnia due to other mental disorder: Secondary | ICD-10-CM | POA: Diagnosis not present

## 2021-10-28 DIAGNOSIS — Z7984 Long term (current) use of oral hypoglycemic drugs: Secondary | ICD-10-CM | POA: Diagnosis not present

## 2021-10-28 DIAGNOSIS — F411 Generalized anxiety disorder: Secondary | ICD-10-CM | POA: Diagnosis not present

## 2021-10-28 DIAGNOSIS — E1151 Type 2 diabetes mellitus with diabetic peripheral angiopathy without gangrene: Secondary | ICD-10-CM | POA: Diagnosis not present

## 2021-10-28 DIAGNOSIS — E119 Type 2 diabetes mellitus without complications: Secondary | ICD-10-CM | POA: Diagnosis not present

## 2021-10-28 DIAGNOSIS — K120 Recurrent oral aphthae: Secondary | ICD-10-CM | POA: Diagnosis not present

## 2021-10-28 DIAGNOSIS — F331 Major depressive disorder, recurrent, moderate: Secondary | ICD-10-CM | POA: Diagnosis not present

## 2021-11-06 DIAGNOSIS — G8929 Other chronic pain: Secondary | ICD-10-CM | POA: Diagnosis not present

## 2021-11-06 DIAGNOSIS — K120 Recurrent oral aphthae: Secondary | ICD-10-CM | POA: Diagnosis not present

## 2021-11-06 DIAGNOSIS — M6281 Muscle weakness (generalized): Secondary | ICD-10-CM | POA: Diagnosis not present

## 2021-11-20 DIAGNOSIS — K5900 Constipation, unspecified: Secondary | ICD-10-CM | POA: Diagnosis not present

## 2021-11-22 DIAGNOSIS — G8929 Other chronic pain: Secondary | ICD-10-CM | POA: Diagnosis not present

## 2021-11-22 DIAGNOSIS — M545 Low back pain, unspecified: Secondary | ICD-10-CM | POA: Diagnosis not present

## 2021-11-22 DIAGNOSIS — M6281 Muscle weakness (generalized): Secondary | ICD-10-CM | POA: Diagnosis not present

## 2021-11-25 DIAGNOSIS — F331 Major depressive disorder, recurrent, moderate: Secondary | ICD-10-CM | POA: Diagnosis not present

## 2021-11-25 DIAGNOSIS — F5105 Insomnia due to other mental disorder: Secondary | ICD-10-CM | POA: Diagnosis not present

## 2021-11-25 DIAGNOSIS — F411 Generalized anxiety disorder: Secondary | ICD-10-CM | POA: Diagnosis not present

## 2021-11-29 ENCOUNTER — Non-Acute Institutional Stay: Payer: Medicare Other | Admitting: Nurse Practitioner

## 2021-11-29 ENCOUNTER — Encounter: Payer: Self-pay | Admitting: Nurse Practitioner

## 2021-11-29 VITALS — BP 135/74 | HR 75 | Temp 97.7°F | Resp 18 | Wt 134.0 lb

## 2021-11-29 DIAGNOSIS — I5032 Chronic diastolic (congestive) heart failure: Secondary | ICD-10-CM | POA: Diagnosis not present

## 2021-11-29 DIAGNOSIS — Z515 Encounter for palliative care: Secondary | ICD-10-CM | POA: Diagnosis not present

## 2021-11-29 DIAGNOSIS — R0602 Shortness of breath: Secondary | ICD-10-CM

## 2021-11-29 NOTE — Progress Notes (Signed)
[place] Consult Note Telephone: [phone]  Fax: [phone]    Date of encounter: 11/29/21 11:09 AM PATIENT NAME: Lydia King [id number]   [phone] (home)  DOB: [birth year] MRN: [id number] PRIMARY CARE PROVIDER:    [facility]  RESPONSIBLE PARTY:    Contact Information     Name Relation Home Work Mobile   [place] Daughter [phone]  [phone]   [name] Daughter   [phone]     I met face to face with patient in facility. Palliative Care was asked to follow this patient by consultation request of  [place] to address advance care planning and complex medical decision making. This is a follow up visit.                                  ASSESSMENT AND PLAN / RECOMMENDATIONS:  Symptom Management/Plan: 1. Advance Care Planning; DNR in [place]; reviewed, wishes are to treat what is treatable, hospitalization if needed   2. Shortness of breath secondary to CHF, stable,    3. Palliative care encounter; Palliative care encounter; Palliative medicine team will continue to support patient, patient's family, and medical team. Visit consisted of counseling and education dealing with the complex and emotionally intense issues of symptom management and palliative care in the setting of serious and potentially life-threatening illness   4. f/u 8 weeks for ongoing monitoring chronic disease management, supportive care, symptoms   Follow up Palliative Care Visit: Palliative care will continue to follow for complex medical decision making, advance care planning, and clarification of goals. Return 8 weeks or prn.   I spent 35 minutes providing this consultation starting at 10:10 am. More than 50% of the time in this consultation was spent in counseling and care coordination. PPS: 50% Chief Complaint: Follow up palliative consult for complex medical decision  making   HISTORY OF PRESENT ILLNESS:  Lydia King is a 88 y.o. year old female  with multiple medical problems including chronic HF (EF 55% in July 2021), CAD with 2 stents placed, OSA with CPAP use, Asthma, DM2, Osteopenia, Osteoarthritis multiple joints and history of CVA with cortical blindness. Lydia King continues to reside in [place] at [facility]. Lydia King is independent for her personal hygiene. She is ambulatory with her walker. Lydia. King feeds herself with good appetite. At present Lydia King is lying in bed. Lydia. King and I talked about purpose of pc visit. Lydia. King in agreement. We talked about how she has been feeling. Lydia. King endorses doing well. Continues to have some difficulty with her left shoulder, chronic and increase in pain with weather changes, arthritis, managed by primary. We talked about ros, daily routine, residing at facility. We talked about what brings her joy. We talked about her daughter who has been sick and unable to visit. Lydia King has books she reads. We talked about role pc in poc. Discussed no recent hospitalizations, infections, falls, wounds. We talked about f/u pc visit next 4 to 8 weeks prn depending if declines, currently stable. Medical goals reviewed. Attempted to contact daughter for update, no new changes to poc, updated staff.  Therapeutic listening, emotional support provided. Questions answered. Most PC visit supportive.  History obtained from review of EMR, discussion with facility staff and Lydia King.  I reviewed available labs, medications, imaging,  studies and related documents from the EMR.  Records reviewed and summarized above.  ROS 10 point system reviewed all negative except HPI Physical Exam: Constitutional: NAD General: frail appearing, pleasant female EYES: lids intact ENMT: oral mucous membranes moist CV: S1S2, RRR, no LE edema Pulmonary: LCTA, no  increased work of breathing, no cough, room air Abdomen:  normo-active BS + 4 quadrants, soft and non tender MSK: ambulatory with walker Skin: warm and dry Neuro:  no generalized weakness,  no cognitive impairment Psych: non-anxious affect, A and O x 3  Thank you for the opportunity to participate in the care of Lydia King.  The palliative care team will continue to follow. Please call our office at [phone] if we can be of additional assistance.   Derika Eckles Ihor Gully, NP

## 2021-12-10 DIAGNOSIS — M25511 Pain in right shoulder: Secondary | ICD-10-CM | POA: Diagnosis not present

## 2021-12-10 DIAGNOSIS — R079 Chest pain, unspecified: Secondary | ICD-10-CM | POA: Diagnosis not present

## 2021-12-10 DIAGNOSIS — M25522 Pain in left elbow: Secondary | ICD-10-CM | POA: Diagnosis not present

## 2021-12-11 ENCOUNTER — Emergency Department
Admission: EM | Admit: 2021-12-11 | Discharge: 2021-12-11 | Disposition: A | Discharge status: Home / Self Care | Payer: Medicare Other | Attending: Emergency Medicine | Admitting: Emergency Medicine

## 2021-12-11 ENCOUNTER — Emergency Department: Payer: Medicare Other

## 2021-12-11 ENCOUNTER — Other Ambulatory Visit: Payer: Self-pay

## 2021-12-11 DIAGNOSIS — R079 Chest pain, unspecified: Secondary | ICD-10-CM | POA: Diagnosis not present

## 2021-12-11 DIAGNOSIS — I959 Hypotension, unspecified: Secondary | ICD-10-CM | POA: Diagnosis not present

## 2021-12-11 DIAGNOSIS — J45909 Unspecified asthma, uncomplicated: Secondary | ICD-10-CM | POA: Diagnosis not present

## 2021-12-11 DIAGNOSIS — R072 Precordial pain: Secondary | ICD-10-CM | POA: Diagnosis not present

## 2021-12-11 DIAGNOSIS — R0689 Other abnormalities of breathing: Secondary | ICD-10-CM | POA: Diagnosis not present

## 2021-12-11 DIAGNOSIS — I2510 Atherosclerotic heart disease of native coronary artery without angina pectoris: Secondary | ICD-10-CM | POA: Diagnosis not present

## 2021-12-11 DIAGNOSIS — I10 Essential (primary) hypertension: Secondary | ICD-10-CM | POA: Diagnosis not present

## 2021-12-11 DIAGNOSIS — R0789 Other chest pain: Secondary | ICD-10-CM | POA: Diagnosis not present

## 2021-12-11 LAB — CBC
HCT: 41.6 % (ref 36.0–46.0)
Hemoglobin: 13.5 g/dL (ref 12.0–15.0)
MCH: 30.5 pg (ref 26.0–34.0)
MCHC: 32.5 g/dL (ref 30.0–36.0)
MCV: 93.9 fL (ref 80.0–100.0)
Platelets: 183 10*3/uL (ref 150–400)
RBC: 4.43 MIL/uL (ref 3.87–5.11)
RDW: 13.4 % (ref 11.5–15.5)
WBC: 5.6 10*3/uL (ref 4.0–10.5)
nRBC: 0 % (ref 0.0–0.2)

## 2021-12-11 LAB — BASIC METABOLIC PANEL
Anion gap: 10 (ref 5–15)
BUN: 32 mg/dL — ABNORMAL HIGH (ref 8–23)
CO2: 24 mmol/L (ref 22–32)
Calcium: 9.9 mg/dL (ref 8.9–10.3)
Chloride: 104 mmol/L (ref 98–111)
Creatinine, Ser: 0.95 mg/dL (ref 0.44–1.00)
GFR, Estimated: 57 mL/min — ABNORMAL LOW (ref >60)
Glucose, Bld: 134 mg/dL — ABNORMAL HIGH (ref 70–99)
Potassium: 3.9 mmol/L (ref 3.5–5.1)
Sodium: 138 mmol/L (ref 135–145)

## 2021-12-11 LAB — TROPONIN I (HIGH SENSITIVITY)
Troponin I (High Sensitivity): 9 ng/L (ref <18)
Troponin I (High Sensitivity): 10 ng/L (ref <18)

## 2021-12-11 NOTE — Discharge Instructions (Addendum)
   Thank you for choosing us for your health care today!  Please see your primary doctor this week for a follow up appointment.   If you do not have a primary doctor call the following clinics to establish care:  If you have insurance:  [facility] [phone] [address] [place] [address]   [facility]  [phone] [address] [place] [address]   If you do not have insurance:  [facility]  [phone] [address] [place] [address]  Sometimes, in the early stages of certain disease courses it is difficult to detect in the emergency department evaluation -- so, it is important that you continue to monitor your symptoms and call your doctor right away or return to the emergency department if you develop any new or worsening symptoms.  It was my pleasure to care for you today.   Niyla Marone S. Leeandre Nordling, MD  

## 2021-12-11 NOTE — ED Triage Notes (Signed)
Pt comes via EMs from [facility] care with c/o CP that started last night. Pt has EKG performed at PCP and it was abnormal so pt was sent here. EMS states EKG was NSR.   Pt was given 4-81 mg aspirin at facility. Pt denies any current pain.  BP-174/143 CBG-168 HR-71 O2-96%  Pt is at baseline per EMS. Pt does have hx of MI and diabetes.

## 2021-12-11 NOTE — ED Provider Notes (Signed)
[facility] Provider Note    Event Date/Time   First MD Initiated Contact with Patient 12/11/21 1341     (approximate)   History   Chest Pain   HPI  Lydia King is a 88 y.o. female   This 88 year old woman has a past medical history of DVT no longer anticoagulated, stroke, anemia, asthma, CAD, hyperlipidemia, hypertension and a hiatal hernia who presents to the emergency department with a single episode of chest discomfort last night after eating.  She laid down and had some substernal chest pain.  Nonradiating and nonexertional.  No associated shortness of breath or nausea.  This went away after taking some Tums and she rested comfortably for the rest of the night.  Awoke this morning with no symptoms and told her nurse who advised that she come to the emergency department for a check.  Currently no symptoms.     Physical Exam   Triage Vital Signs: ED Triage Vitals  Enc Vitals Group     BP 12/11/21 1231 (!) 147/84     Pulse Rate 12/11/21 1231 72     Resp 12/11/21 1231 18     Temp 12/11/21 1231 98.7 F (37.1 C)     Temp Source 12/11/21 1231 Oral     SpO2 12/11/21 1231 94 %     Weight --      Height --      Head Circumference --      Peak Flow --      Pain Score 12/11/21 1226 0     Pain Loc --      Pain Edu? --      Excl. in [place]? --     Most recent vital signs: Vitals:   12/11/21 1430 12/11/21 1445  BP: (!) 156/135   Pulse: 68 66  Resp:    Temp:    SpO2: 97% 100%    General: Awake, no distress.  CV:  Good peripheral perfusion.  Heart sounds normal rate and rhythm Resp:  Normal effort.  Clear to auscultation bilaterally without wheezing or focalities Abd:  No distention.  Soft and nontender to deep palpation in all quadrants. Other:  Awake alert and pleasant.   ED Results / Procedures / Treatments   Labs (all labs ordered are listed, but only abnormal results are displayed) Labs Reviewed  BASIC METABOLIC PANEL - Abnormal;  Notable for the following components:      Result Value   Glucose, Bld 134 (*)    BUN 32 (*)    GFR, Estimated 57 (*)    All other components within normal limits  CBC  TROPONIN I (HIGH SENSITIVITY)  TROPONIN I (HIGH SENSITIVITY)     I reviewed labs and they are notable for trop flat x2.  EKG  ED ECG REPORT I, Lucillie Garfinkel, the attending physician, personally viewed and interpreted this ECG.   Date: 12/11/2021  EKG Time: 1238  Rate: 69  Rhythm: normal sinus rhythm  Axis: nl  Intervals:none  ST&T Change: no acute ischemic changes     RADIOLOGY I independently reviewed and interpreted the x-ray and see no pneumothorax.   PROCEDURES:  Critical Care performed: No  Procedures   MEDICATIONS ORDERED IN ED: Medications - No data to display   IMPRESSION / MDM / [place] / ED COURSE  I reviewed the triage vital signs and the nursing notes.  Differential diagnosis includes, but is not limited to, ACS, PE, hiatal hernia/GERD/gastritis    MDM: Patient with multiple cardiac risk factors with atypical chest pain will evaluate for ACS with EKG which fortunately is nonischemic and troponins which were flat x2.  She has been asymptomatic and hemodynamically stable throughout the entirety of her emergency department visit.  She has no dyspnea or other symptoms or signs suggestive of PE, this pain may be due to her hiatal hernia.  She will be discharged from the emergency department today with negative evaluation as above and will follow-up with PMD and given return precautions.  Patient's presentation is most consistent with acute presentation with potential threat to life or bodily function.       FINAL CLINICAL IMPRESSION(S) / ED DIAGNOSES   Final diagnoses:  Atypical chest pain     Rx / DC Orders   ED Discharge Orders     None        Note:  This document was prepared using Dragon voice recognition software and may  include unintentional dictation errors.    Lucillie Garfinkel, MD 12/11/21 [phone]

## 2021-12-17 ENCOUNTER — Telehealth: Payer: Self-pay

## 2021-12-17 NOTE — Progress Notes (Addendum)
Chronic Care Management Pharmacy Assistant   Name: Lydia King  MRN: [id number] DOB: [birth year]  Reason for Encounter: CCM [facility] Follow Up)  Medications: Outpatient Encounter Medications as of 12/17/2021  Medication Sig   acetaminophen (TYLENOL) 500 MG tablet Take 2 tablets (1,000 mg total) by mouth in the morning, at noon, and at bedtime. (Patient not taking: Reported on 05/17/2021)   aspirin EC 81 MG EC tablet Take 1 tablet (81 mg total) by mouth daily. Swallow whole.   calcium carbonate (TUMS - DOSED IN MG ELEMENTAL CALCIUM) 500 MG chewable tablet Chew 12 tablets (2,400 mg of elemental calcium total) by mouth 3 (three) times daily with meals.   Capsaicin-Menthol (SALONPAS PAIN REL GEL-PTCH HOT) 0.025-1.25 % PTCH Apply topically. (Patient not taking: Reported on 05/17/2021)   carvedilol (COREG) 6.25 MG tablet Take 1 tablet (6.25 mg total) by mouth 2 (two) times daily with a meal.   Cholecalciferol (VITAMIN D) 2000 UNITS CAPS Take 2 capsules by mouth daily. (Patient not taking: Reported on 05/17/2021)   cloNIDine (CATAPRES) 0.1 MG tablet Take 0.1 mg by mouth 3 (three) times daily as needed. (Patient not taking: Reported on 05/17/2021)   clopidogrel (PLAVIX) 75 MG tablet TAKE 1 TABLET BY MOUTH EVERY DAY (Patient not taking: Reported on 05/17/2021)   cyanocobalamin 1000 MCG tablet Take 1,000 mcg by mouth daily. (Patient not taking: Reported on 05/17/2021)   dexlansoprazole (DEXILANT) 60 MG capsule Take 1 capsule (60 mg total) by mouth daily. (Patient not taking: Reported on 05/17/2021)   diclofenac Sodium (VOLTAREN) 1 % GEL Apply 2 g topically 3 (three) times daily.   EPINEPHrine 0.3 mg/0.3 mL IJ SOAJ injection Inject 0.3 mg into the muscle as needed for anaphylaxis. (Patient not taking: Reported on 05/17/2021)   esomeprazole (NEXIUM) 40 MG capsule Take 40 mg by mouth daily at 12 noon. (Patient not taking: Reported on 05/17/2021)   famotidine (PEPCID) 20 MG tablet Take 20 mg by mouth 2  (two) times daily.   FLUoxetine (PROZAC) 20 MG capsule Take 20 mg by mouth daily.   furosemide (LASIX) 40 MG tablet Take 1 tablet (40 mg total) by mouth daily.   isosorbide mononitrate (IMDUR) 30 MG 24 hr tablet Take 1 tablet (30 mg total) by mouth 2 (two) times daily.   lidocaine (LMX) 4 % cream Apply 1 application topically in the morning and at bedtime. To low back and shoulder areas (Patient not taking: Reported on 05/17/2021)   lisinopril (ZESTRIL) 20 MG tablet Take 40 mg by mouth daily.   Lysine 500 MG CAPS Take 2 caps by mouth three times daily   magic mouthwash SOLN Take 15 mLs by mouth 3 (three) times daily as needed for mouth pain. 1:1:1% Lido/Diphen/Maalox   melatonin 3 MG TABS tablet Take 3 mg by mouth at bedtime.   Menthol-Methyl Salicylate (SALONPAS PAIN RELIEF PATCH EX) Apply 1 patch topically daily. (Patient not taking: Reported on 05/17/2021)   metFORMIN (GLUCOPHAGE XR) 500 MG 24 hr tablet Take 1 tablet (500 mg total) by mouth daily with breakfast.   Multiple Vitamins-Minerals (PRESERVISION AREDS 2) CAPS Give one pill by mouth bid (Patient not taking: Reported on 05/17/2021)   nitroGLYCERIN (NITROSTAT) 0.4 MG SL tablet Place under the tongue. (Patient not taking: Reported on 09/17/2020)   omega-3 acid ethyl esters (LOVAZA) 1 g capsule TAKE 1 CAPSULE (1 G TOTAL) BY MOUTH DAILY.   ondansetron (ZOFRAN) 4 MG tablet Take 4 mg by mouth every 4 (four) hours as needed  for nausea or vomiting. (Patient not taking: Reported on 05/17/2021)   polyethylene glycol (MIRALAX / GLYCOLAX) 17 g packet Take 17 g by mouth daily. (Patient not taking: Reported on 05/17/2021)   prednisoLONE acetate (PRED FORTE) 1 % ophthalmic suspension Place 1 drop into the left eye every 12 (twelve) hours.   PROAIR HFA 108 (90 Base) MCG/ACT inhaler INHALE 2 PUFFS INTO THE LUNGS EVERY 4 (FOUR) HOURS AS NEEDED FOR WHEEZING OR SHORTNESS OF BREATH. (Patient not taking: Reported on 05/17/2021)   Propylene Glycol (SYSTANE BALANCE)  0.6 % SOLN Apply 1 drop to eye at bedtime as needed.   rosuvastatin (CRESTOR) 20 MG tablet TAKE 1 TABLET BY MOUTH EVERYDAY AT BEDTIME (Patient not taking: Reported on 05/17/2021)   sennosides-docusate sodium (SENOKOT-S) 8.6-50 MG tablet Take 1 tablet by mouth daily. (Patient not taking: Reported on 05/17/2021)   SitaGLIPtin-MetFORMIN HCl (JANUMET XR) [phone] MG TB24 Take 1 tablet by mouth daily.   traMADol (ULTRAM) 50 MG tablet Take 50 mg by mouth 4 (four) times daily as needed.   trolamine salicylate (ASPERCREME/ALOE) 10 % cream Apply 1 application topically daily. To shoulder areas   No facility-administered encounter medications on file as of 12/17/2021.   Reviewed hospital notes for details of recent visit. Has patient been contacted by Transitions of Care team? No Has patient seen PCP/specialist for hospital follow up (summarize OV if yes): No  Admitted to the ED on 12/11/2021. Discharge date was 12/11/2021.  Discharged from [facility].   Discharge diagnosis (Principal Problem): Atypical chest pain  Patient was discharged to Home  Brief summary of hospital course: Patient with multiple cardiac risk factors with atypical chest pain will evaluate for ACS with EKG which fortunately is nonischemic and troponins which were flat x2.  She has been asymptomatic and hemodynamically stable throughout the entirety of her emergency department visit.  She has no dyspnea or other symptoms or signs suggestive of PE, this pain may be due to her hiatal hernia.  She will be discharged from the emergency department today with negative evaluation as above and will follow-up with PMD and given return precautions.   New?Medications Started at [facility] Discharge:?? None noted  Medication Changes at Hospital Discharge: None noted  Medications Discontinued at Hospital Discharge: None noted  Medications that remain the same after Hospital Discharge:??  -All other medications will remain the  same.    Next CCM appt: Non-CCM  Other upcoming appts: No appointments scheduled within the next 30 days.  Charlene Brooke, PharmD notified and will determine if action is needed.  Charlene Brooke, CPP notified  Marijean Niemann, [place] Clinical Pharmacy Assistant [phone]   Pharmacist addendum: Pt was referred to see PharmD back in April 2023 but was never scheduled due to inability to reach patient for scheduling. Pt is seeing palliative care now. No further action needed.  Charlene Brooke, PharmD, BCACP 12/24/21 2:06 PM

## 2021-12-18 DIAGNOSIS — M25811 Other specified joint disorders, right shoulder: Secondary | ICD-10-CM | POA: Diagnosis not present

## 2021-12-18 DIAGNOSIS — M19012 Primary osteoarthritis, left shoulder: Secondary | ICD-10-CM | POA: Diagnosis not present

## 2021-12-18 DIAGNOSIS — M533 Sacrococcygeal disorders, not elsewhere classified: Secondary | ICD-10-CM | POA: Diagnosis not present

## 2021-12-18 DIAGNOSIS — M19011 Primary osteoarthritis, right shoulder: Secondary | ICD-10-CM | POA: Diagnosis not present

## 2021-12-18 DIAGNOSIS — E119 Type 2 diabetes mellitus without complications: Secondary | ICD-10-CM | POA: Diagnosis not present

## 2021-12-23 DIAGNOSIS — F411 Generalized anxiety disorder: Secondary | ICD-10-CM | POA: Diagnosis not present

## 2021-12-23 DIAGNOSIS — F331 Major depressive disorder, recurrent, moderate: Secondary | ICD-10-CM | POA: Diagnosis not present

## 2021-12-23 DIAGNOSIS — F5105 Insomnia due to other mental disorder: Secondary | ICD-10-CM | POA: Diagnosis not present

## 2021-12-27 DIAGNOSIS — G8929 Other chronic pain: Secondary | ICD-10-CM | POA: Diagnosis not present

## 2022-01-06 DIAGNOSIS — Z7984 Long term (current) use of oral hypoglycemic drugs: Secondary | ICD-10-CM | POA: Diagnosis not present

## 2022-01-06 DIAGNOSIS — E1151 Type 2 diabetes mellitus with diabetic peripheral angiopathy without gangrene: Secondary | ICD-10-CM | POA: Diagnosis not present

## 2022-01-16 DIAGNOSIS — F331 Major depressive disorder, recurrent, moderate: Secondary | ICD-10-CM | POA: Diagnosis not present

## 2022-01-16 DIAGNOSIS — F411 Generalized anxiety disorder: Secondary | ICD-10-CM | POA: Diagnosis not present

## 2022-01-16 DIAGNOSIS — F5105 Insomnia due to other mental disorder: Secondary | ICD-10-CM | POA: Diagnosis not present

## 2022-01-28 ENCOUNTER — Non-Acute Institutional Stay: Payer: Medicare Other | Admitting: Nurse Practitioner

## 2022-01-28 ENCOUNTER — Encounter: Payer: Self-pay | Admitting: Nurse Practitioner

## 2022-01-28 VITALS — BP 130/72 | HR 60 | Temp 98.0°F | Resp 18 | Wt 131.9 lb

## 2022-01-28 DIAGNOSIS — G8929 Other chronic pain: Secondary | ICD-10-CM

## 2022-01-28 DIAGNOSIS — I5032 Chronic diastolic (congestive) heart failure: Secondary | ICD-10-CM

## 2022-01-28 DIAGNOSIS — Z515 Encounter for palliative care: Secondary | ICD-10-CM

## 2022-01-28 DIAGNOSIS — M25511 Pain in right shoulder: Secondary | ICD-10-CM | POA: Diagnosis not present

## 2022-01-28 DIAGNOSIS — E119 Type 2 diabetes mellitus without complications: Secondary | ICD-10-CM | POA: Diagnosis not present

## 2022-01-28 DIAGNOSIS — R0602 Shortness of breath: Secondary | ICD-10-CM | POA: Diagnosis not present

## 2022-01-28 NOTE — Progress Notes (Addendum)
[place] Consult Note Telephone: [phone]  Fax: [phone]    Date of encounter: 01/28/22 6:37 PM PATIENT NAME: Lydia King Lydia King [id number]   [phone] (home)  DOB: 10/30/1930 MRN: [id number] PRIMARY CARE PROVIDER:    [facility]  RESPONSIBLE PARTY:    Contact Information     Name Relation Home Work Mobile   Lydia King Daughter [phone]  [phone]   Lydia King Daughter   [phone]      I met face to face with patient in facility. Palliative Care was asked to follow this patient by consultation request of  [place] to address advance care planning and complex medical decision making. This is a follow up visit.                                  ASSESSMENT AND PLAN / RECOMMENDATIONS:  Symptom Management/Plan: 1. Advance Care Planning; DNR in [place]; reviewed, wishes are to treat what is treatable, hospitalization if needed   2. Shortness of breath secondary to CHF, stable, reviewed weights, asymptomatic sob, monitor for edema, continue current medications  01/22/2022 weight 131.9 lbs  3. Pain right shoulder chronic; OA, discussed pain scale, current medications, previously had received injections. Discussed importance of mobility, exercises. Continue to monitor on pain scale, monitor efficacy of pain regimen.    4. Palliative care encounter; Palliative care encounter; Palliative medicine team will continue to support patient, patient's family, and medical team. Visit consisted of counseling and education dealing with the complex and emotionally intense issues of symptom management and palliative care in the setting of serious and potentially life-threatening illness   Follow up Palliative Care Visit: Palliative care will continue to follow for complex medical decision making, advance care planning, and clarification of goals. Return 4 to 8 weeks or  prn.   I spent 45 minutes providing this consultation. More than 50% of the time in this consultation was spent in counseling and care coordination. PPS: 50% Chief Complaint: Follow up palliative consult for complex medical decision making   HISTORY OF PRESENT ILLNESS:  Lydia King is a 89 y.o. year old female  with multiple medical problems including chronic HF (EF 55% in July 2021), CAD with 2 stents placed, OSA with CPAP use, Asthma, DM2, Osteopenia, Osteoarthritis multiple joints and history of CVA with cortical blindness. [name] continues to reside in [place] at [facility]. [name] is independent for her personal hygiene. She is ambulatory with her walker. [name] does require assistance with bathing, dressing. [name] is able to feed herself with good appetite. Current weight is 131.9 lbs. No recent falls, wounds, infections, hospitalizations. At present [name] is sitting on her bed reading, appears comfortable, no visitors present. We talked about how [name] has been feeling, purpose of pc visit. We talked about ros, functional abilities. We talked about going to her daughter's home for the holidays, she tolerated well. We talked about family dynamics. We talked about her pain to right shoulder, with previous injections and currently stable with current regimen. We talked about daily routine, what she was reading, quality of life. We talked about progression of aging, [name] endorses she is 89 yrs old. We talked about residing at facility, [name] talked about her roommate and how well they get along, currently at her with  her family. We talked about sob, no recent exacerbations. We talked about role pc in poc. Medications, medical goals, poc reviewed. No new changes for today visit, will continue to monitor weights, disease progression, symptoms, goc. Updated staff.   01/29/2022 I called [name]'s  daughter, Lydia King. Update given, talked about pc visit, going home for the holidays. We talked about pain in her back and right shoulder chronic with current regimen. No new concerns. Supportive role, provided. Contact information provided.   Medical goals reviewed. Attempted to contact daughter for update, no new changes to poc, updated staff.   Therapeutic listening, emotional support provided. Questions answered. Most PC visit supportive.  History obtained from review of EMR, discussion with facility staff and Ms. Gunkel.  I reviewed available labs, medications, imaging, studies and related documents from the EMR.  Records reviewed and summarized above.  ROS 10 point system reviewed all negative except HPI Physical Exam: Constitutional: NAD General: frail appearing, pleasant female EYES: lids intact ENMT: oral mucous membranes moist CV: S1S2, RRR, no LE edema Pulmonary: LCTA, no increased work of breathing, no cough, room air Abdomen:  normo-active BS + 4 quadrants, soft and non tender MSK: ambulatory with walker Skin: warm and dry Neuro:  no generalized weakness,  no cognitive impairment Psych: non-anxious affect, A and O x 3Thank you for the opportunity to participate in the care of [name]. Please call our office at [phone] if we can be of additional assistance.   Tiffany Talarico Ihor Gully, NP

## 2022-01-29 DIAGNOSIS — E1159 Type 2 diabetes mellitus with other circulatory complications: Secondary | ICD-10-CM | POA: Diagnosis not present

## 2022-01-29 DIAGNOSIS — B3789 Other sites of candidiasis: Secondary | ICD-10-CM | POA: Diagnosis not present

## 2022-01-31 DIAGNOSIS — M1990 Unspecified osteoarthritis, unspecified site: Secondary | ICD-10-CM | POA: Diagnosis not present

## 2022-01-31 DIAGNOSIS — H353111 Nonexudative age-related macular degeneration, right eye, early dry stage: Secondary | ICD-10-CM | POA: Diagnosis not present

## 2022-01-31 DIAGNOSIS — E119 Type 2 diabetes mellitus without complications: Secondary | ICD-10-CM | POA: Diagnosis not present

## 2022-01-31 DIAGNOSIS — H04123 Dry eye syndrome of bilateral lacrimal glands: Secondary | ICD-10-CM | POA: Diagnosis not present

## 2022-01-31 DIAGNOSIS — H353221 Exudative age-related macular degeneration, left eye, with active choroidal neovascularization: Secondary | ICD-10-CM | POA: Diagnosis not present

## 2022-02-08 DIAGNOSIS — R112 Nausea with vomiting, unspecified: Secondary | ICD-10-CM | POA: Diagnosis not present

## 2022-02-09 DIAGNOSIS — R109 Unspecified abdominal pain: Secondary | ICD-10-CM | POA: Diagnosis not present

## 2022-02-10 DIAGNOSIS — F419 Anxiety disorder, unspecified: Secondary | ICD-10-CM | POA: Diagnosis not present

## 2022-02-10 DIAGNOSIS — D649 Anemia, unspecified: Secondary | ICD-10-CM | POA: Diagnosis not present

## 2022-02-10 DIAGNOSIS — K5900 Constipation, unspecified: Secondary | ICD-10-CM | POA: Diagnosis not present

## 2022-02-11 DIAGNOSIS — K5900 Constipation, unspecified: Secondary | ICD-10-CM | POA: Diagnosis not present

## 2022-02-13 DIAGNOSIS — K449 Diaphragmatic hernia without obstruction or gangrene: Secondary | ICD-10-CM | POA: Diagnosis not present

## 2022-02-14 DIAGNOSIS — F411 Generalized anxiety disorder: Secondary | ICD-10-CM | POA: Diagnosis not present

## 2022-02-14 DIAGNOSIS — F331 Major depressive disorder, recurrent, moderate: Secondary | ICD-10-CM | POA: Diagnosis not present

## 2022-02-14 DIAGNOSIS — F5105 Insomnia due to other mental disorder: Secondary | ICD-10-CM | POA: Diagnosis not present

## 2022-02-14 DIAGNOSIS — R52 Pain, unspecified: Secondary | ICD-10-CM | POA: Diagnosis not present

## 2022-02-25 DIAGNOSIS — H43813 Vitreous degeneration, bilateral: Secondary | ICD-10-CM | POA: Diagnosis not present

## 2022-02-25 DIAGNOSIS — H353220 Exudative age-related macular degeneration, left eye, stage unspecified: Secondary | ICD-10-CM | POA: Diagnosis not present

## 2022-02-25 DIAGNOSIS — H04123 Dry eye syndrome of bilateral lacrimal glands: Secondary | ICD-10-CM | POA: Diagnosis not present

## 2022-02-26 DIAGNOSIS — D649 Anemia, unspecified: Secondary | ICD-10-CM | POA: Diagnosis not present

## 2022-02-26 DIAGNOSIS — G600 Hereditary motor and sensory neuropathy: Secondary | ICD-10-CM | POA: Diagnosis not present

## 2022-02-26 DIAGNOSIS — E119 Type 2 diabetes mellitus without complications: Secondary | ICD-10-CM | POA: Diagnosis not present

## 2022-02-26 DIAGNOSIS — G4733 Obstructive sleep apnea (adult) (pediatric): Secondary | ICD-10-CM | POA: Diagnosis not present

## 2022-03-03 DIAGNOSIS — Z760 Encounter for issue of repeat prescription: Secondary | ICD-10-CM | POA: Diagnosis not present

## 2022-03-03 DIAGNOSIS — G8929 Other chronic pain: Secondary | ICD-10-CM | POA: Diagnosis not present

## 2022-03-04 DIAGNOSIS — H353221 Exudative age-related macular degeneration, left eye, with active choroidal neovascularization: Secondary | ICD-10-CM | POA: Diagnosis not present

## 2022-03-07 DIAGNOSIS — G600 Hereditary motor and sensory neuropathy: Secondary | ICD-10-CM | POA: Diagnosis not present

## 2022-03-07 DIAGNOSIS — G4733 Obstructive sleep apnea (adult) (pediatric): Secondary | ICD-10-CM | POA: Diagnosis not present

## 2022-03-07 DIAGNOSIS — I10 Essential (primary) hypertension: Secondary | ICD-10-CM | POA: Diagnosis not present

## 2022-03-13 DIAGNOSIS — E119 Type 2 diabetes mellitus without complications: Secondary | ICD-10-CM | POA: Diagnosis not present

## 2022-03-13 DIAGNOSIS — Z91199 Patient's noncompliance with other medical treatment and regimen due to unspecified reason: Secondary | ICD-10-CM | POA: Diagnosis not present

## 2022-03-17 DIAGNOSIS — Z7984 Long term (current) use of oral hypoglycemic drugs: Secondary | ICD-10-CM | POA: Diagnosis not present

## 2022-03-17 DIAGNOSIS — Z79899 Other long term (current) drug therapy: Secondary | ICD-10-CM | POA: Diagnosis not present

## 2022-03-17 DIAGNOSIS — M2042 Other hammer toe(s) (acquired), left foot: Secondary | ICD-10-CM | POA: Diagnosis not present

## 2022-03-17 DIAGNOSIS — E1151 Type 2 diabetes mellitus with diabetic peripheral angiopathy without gangrene: Secondary | ICD-10-CM | POA: Diagnosis not present

## 2022-03-17 DIAGNOSIS — M2041 Other hammer toe(s) (acquired), right foot: Secondary | ICD-10-CM | POA: Diagnosis not present

## 2022-03-20 ENCOUNTER — Encounter: Payer: Self-pay | Admitting: Nurse Practitioner

## 2022-03-20 ENCOUNTER — Non-Acute Institutional Stay: Payer: Medicare Other | Admitting: Nurse Practitioner

## 2022-03-20 DIAGNOSIS — I5032 Chronic diastolic (congestive) heart failure: Secondary | ICD-10-CM | POA: Diagnosis not present

## 2022-03-20 DIAGNOSIS — R0602 Shortness of breath: Secondary | ICD-10-CM | POA: Diagnosis not present

## 2022-03-20 DIAGNOSIS — Z515 Encounter for palliative care: Secondary | ICD-10-CM

## 2022-03-20 NOTE — Progress Notes (Addendum)
[facility] Consult Note Telephone: [phone]  Fax: [phone]    Date of encounter: 03/20/22 11:56 AM PATIENT NAME: Lydia King [id number]   [phone] (home)  DOB: Jun 21, 1930 MRN: [id number] PRIMARY CARE PROVIDER:    [facility]  RESPONSIBLE PARTY:    Contact Information     Name Relation Home Work Mobile   Arnold Daughter [phone]  [phone]   [name] Daughter   [phone]     I met face to face with patient in facility. Palliative Care was asked to follow this patient by consultation request of  [place] to address advance care planning and complex medical decision making. This is a follow up visit.                                  ASSESSMENT AND PLAN / RECOMMENDATIONS:  Symptom Management/Plan: 1. Advance Care Planning; DNR in [place]; reviewed, wishes are to treat what is treatable, hospitalization if needed   2. Shortness of breath secondary to CHF, stable, reviewed weights, asymptomatic sob, monitor for edema, continue current medications   01/22/2022 weight 131.9 lbs  03/07/2022 weight 131.7 lbs 3. Palliative care encounter; Palliative care encounter; Palliative medicine team will continue to support patient, patient's family, and medical team. Visit consisted of counseling and education dealing with the complex and emotionally intense issues of symptom management and palliative care in the setting of serious and potentially life-threatening illness   Follow up Palliative Care Visit: Palliative care will continue to follow for complex medical decision making, advance care planning, and clarification of goals. Return 4 to 8 weeks or prn.   I spent 46 minutes providing this consultation started at 10:45 am. More than 50% of the time in this consultation was spent in counseling and care coordination. PPS: 50% Chief Complaint: Follow up  palliative consult for complex medical decision making   HISTORY OF PRESENT ILLNESS:  Lydia King is a 89 y.o. year old female  with multiple medical problems including chronic HF (EF 55% in July 2021), CAD with 2 stents placed, OSA with CPAP use, Asthma, DM2, Osteopenia, Osteoarthritis multiple joints and history of CVA with cortical blindness. Ms Lydia King continues to reside in [place] at [facility]. Ms Lydia King is independent for her personal hygiene. She is ambulatory with her walker. Ms Lydia King does require assistance with bathing, dressing. Ms Lydia King, [place] NP [facility] student and I visited and observed Ms Lydia King, no new falls, wounds, infections, hospitalizations. Talked about how she has been feeling. Purpose of today PC f/u visit further discussion monitor trends of appetite, weights, monitor for functional, cognitive decline with chronic disease progression, assess any active symptoms, supportive role. Reviewed ros, functional abilities, daily routine, quality of life. Medications, goc, poc reviewed. Support provided. Currently Ms Lydia King is stable, attempted to contact dtg for update on pc visit. No new recommendations for today, stable. Questions answered.    Medical goals reviewed. Attempted to contact daughter for update, no new changes to poc, updated staff.   Therapeutic listening, emotional support provided. Questions answered. Most PC visit supportive.  History obtained from review of EMR, discussion with facility staff and Ms. Lydia King.  I reviewed available labs, medications, imaging, studies and related documents from the EMR.  Records reviewed and summarized above.  ROS 10 point system reviewed all  negative except HPI Physical Exam: General: frail appearing, elderly pleasant female ENMT: oral mucous membranes moist CV: S1S2, RRR, no LE edema Pulmonary: LCTA MSK: ambulatory with walker Skin: warm and  dry Psych: non-anxious affect, A and O x 3 Thank you for the opportunity to participate in the care of Ms. Lydia King. Please call our office at [phone] if we can be of additional assistance.   Elio Haden Ihor Gully, NP

## 2022-03-24 DIAGNOSIS — F419 Anxiety disorder, unspecified: Secondary | ICD-10-CM | POA: Diagnosis not present

## 2022-03-24 DIAGNOSIS — F5105 Insomnia due to other mental disorder: Secondary | ICD-10-CM | POA: Diagnosis not present

## 2022-03-24 DIAGNOSIS — F331 Major depressive disorder, recurrent, moderate: Secondary | ICD-10-CM | POA: Diagnosis not present

## 2022-04-03 DIAGNOSIS — I10 Essential (primary) hypertension: Secondary | ICD-10-CM | POA: Diagnosis not present

## 2022-04-03 DIAGNOSIS — F419 Anxiety disorder, unspecified: Secondary | ICD-10-CM | POA: Diagnosis not present

## 2022-04-03 DIAGNOSIS — M6281 Muscle weakness (generalized): Secondary | ICD-10-CM | POA: Diagnosis not present

## 2022-04-08 DIAGNOSIS — E119 Type 2 diabetes mellitus without complications: Secondary | ICD-10-CM | POA: Diagnosis not present

## 2022-04-16 DIAGNOSIS — I2510 Atherosclerotic heart disease of native coronary artery without angina pectoris: Secondary | ICD-10-CM | POA: Diagnosis not present

## 2022-04-16 DIAGNOSIS — F419 Anxiety disorder, unspecified: Secondary | ICD-10-CM | POA: Diagnosis not present

## 2022-04-16 DIAGNOSIS — I10 Essential (primary) hypertension: Secondary | ICD-10-CM | POA: Diagnosis not present

## 2022-04-17 DIAGNOSIS — K1379 Other lesions of oral mucosa: Secondary | ICD-10-CM | POA: Diagnosis not present

## 2022-04-21 DIAGNOSIS — F419 Anxiety disorder, unspecified: Secondary | ICD-10-CM | POA: Diagnosis not present

## 2022-04-21 DIAGNOSIS — F331 Major depressive disorder, recurrent, moderate: Secondary | ICD-10-CM | POA: Diagnosis not present

## 2022-04-21 DIAGNOSIS — F5105 Insomnia due to other mental disorder: Secondary | ICD-10-CM | POA: Diagnosis not present

## 2022-04-28 DIAGNOSIS — M19011 Primary osteoarthritis, right shoulder: Secondary | ICD-10-CM | POA: Diagnosis not present

## 2022-04-28 DIAGNOSIS — M25811 Other specified joint disorders, right shoulder: Secondary | ICD-10-CM | POA: Diagnosis not present

## 2022-04-28 DIAGNOSIS — M533 Sacrococcygeal disorders, not elsewhere classified: Secondary | ICD-10-CM | POA: Diagnosis not present

## 2022-04-28 DIAGNOSIS — M19012 Primary osteoarthritis, left shoulder: Secondary | ICD-10-CM | POA: Diagnosis not present

## 2022-04-29 DIAGNOSIS — H43813 Vitreous degeneration, bilateral: Secondary | ICD-10-CM | POA: Diagnosis not present

## 2022-04-29 DIAGNOSIS — N390 Urinary tract infection, site not specified: Secondary | ICD-10-CM | POA: Diagnosis not present

## 2022-04-29 DIAGNOSIS — K121 Other forms of stomatitis: Secondary | ICD-10-CM | POA: Diagnosis not present

## 2022-04-29 DIAGNOSIS — H353221 Exudative age-related macular degeneration, left eye, with active choroidal neovascularization: Secondary | ICD-10-CM | POA: Diagnosis not present

## 2022-04-29 DIAGNOSIS — H353112 Nonexudative age-related macular degeneration, right eye, intermediate dry stage: Secondary | ICD-10-CM | POA: Diagnosis not present

## 2022-04-29 DIAGNOSIS — H04123 Dry eye syndrome of bilateral lacrimal glands: Secondary | ICD-10-CM | POA: Diagnosis not present

## 2022-04-30 DIAGNOSIS — R52 Pain, unspecified: Secondary | ICD-10-CM | POA: Diagnosis not present

## 2022-05-01 DIAGNOSIS — G4700 Insomnia, unspecified: Secondary | ICD-10-CM | POA: Diagnosis not present

## 2022-05-02 DIAGNOSIS — N390 Urinary tract infection, site not specified: Secondary | ICD-10-CM | POA: Diagnosis not present

## 2022-05-03 DIAGNOSIS — R300 Dysuria: Secondary | ICD-10-CM | POA: Diagnosis not present

## 2022-05-19 DIAGNOSIS — F331 Major depressive disorder, recurrent, moderate: Secondary | ICD-10-CM | POA: Diagnosis not present

## 2022-05-19 DIAGNOSIS — G894 Chronic pain syndrome: Secondary | ICD-10-CM | POA: Diagnosis not present

## 2022-05-19 DIAGNOSIS — F99 Mental disorder, not otherwise specified: Secondary | ICD-10-CM | POA: Diagnosis not present

## 2022-05-19 DIAGNOSIS — F419 Anxiety disorder, unspecified: Secondary | ICD-10-CM | POA: Diagnosis not present

## 2022-05-19 DIAGNOSIS — F5105 Insomnia due to other mental disorder: Secondary | ICD-10-CM | POA: Diagnosis not present

## 2022-05-20 ENCOUNTER — Non-Acute Institutional Stay: Payer: Medicare Other | Admitting: Nurse Practitioner

## 2022-05-20 VITALS — BP 139/74 | HR 71 | Temp 97.9°F | Resp 18 | Wt 127.9 lb

## 2022-05-20 DIAGNOSIS — I5032 Chronic diastolic (congestive) heart failure: Secondary | ICD-10-CM

## 2022-05-20 DIAGNOSIS — G8929 Other chronic pain: Secondary | ICD-10-CM

## 2022-05-20 DIAGNOSIS — M25511 Pain in right shoulder: Secondary | ICD-10-CM | POA: Diagnosis not present

## 2022-05-20 DIAGNOSIS — Z515 Encounter for palliative care: Secondary | ICD-10-CM

## 2022-05-20 DIAGNOSIS — R0602 Shortness of breath: Secondary | ICD-10-CM

## 2022-05-20 NOTE — Progress Notes (Signed)
Therapist, nutritional Palliative Care Consult Note Telephone: [phone]  Fax: [phone]    Date of encounter: 05/20/22 9:47 PM PATIENT NAME: Lydia King [address] [place] [address]   [phone] (home)  DOB: 04/17/1930 MRN: [id number] PRIMARY CARE PROVIDER:    [facility]  RESPONSIBLE PARTY:    Contact Information     Name Relation Home Work Mobile   Pulpotio Bareas Daughter [phone]  [phone]   Laurance Flatten Daughter   [phone]           [facility] Palliative Care Consult Note Telephone: [phone]  Fax: [phone]      Date of encounter: 03/20/22 11:56 AM PATIENT NAME: Lydia King [address] [place] [address]   [phone] (home)  DOB: 1930/06/29 MRN: [id number] PRIMARY CARE PROVIDER:    [facility]   RESPONSIBLE PARTY:    Contact Information       Name Relation Home Work Mobile    [place] Daughter [phone]   [phone]    Laurance Flatten Daughter     [phone]       I met face to face with patient in facility. Palliative Care was asked to follow this patient by consultation request of  [facility] to address advance care planning and complex medical decision making. This is a follow up visit.                                  ASSESSMENT AND PLAN / RECOMMENDATIONS:  Symptom Management/Plan: 1. Advance Care Planning; DNR in [place]; reviewed, wishes are to treat what is treatable, hospitalization if needed   2. Shortness of breath secondary to CHF, stable, reviewed weights, asymptomatic sob, monitor for edema, continue current medications  3. Anorexia with slight weight loss, reviewed weights; continue to weigh, continue to encourage nutrition, supplements as wishes 01/22/2022 weight 131.9 lbs  03/07/2022 weight 131.7 lbs 05/01/2022 weight 127.9 lbs 4.  Palliative care encounter; Palliative care encounter;  Palliative medicine team will continue to support patient, patient's family, and medical team. Visit consisted of counseling and education dealing with the complex and emotionally intense issues of symptom management and palliative care in the setting of serious and potentially life-threatening illness   Follow up Palliative Care Visit: Palliative care will continue to follow for complex medical decision making, advance care planning, and clarification of goals. Return 4 to 8 weeks or prn.   I spent 47 minutes providing this consultation. More than 50% of the time in this consultation was spent in counseling and care coordination. PPS: 50% Chief Complaint: Follow up palliative consult for complex medical decision making   HISTORY OF PRESENT ILLNESS:  Lydia King is a 89 y.o. year old female  with multiple medical problems including chronic HF (EF 55% in July 2021), CAD with 2 stents placed, OSA with CPAP use, Asthma, DM2, Osteopenia, Osteoarthritis multiple joints and history of CVA with cortical blindness. Lydia King continues to reside in [facility] at [facility]. Lydia King is independent for her personal hygiene. She is ambulatory with her walker. Lydia King does require assistance with bathing, dressing. Lydia King currently is lying in bed reading a book, appears comfortable. Purpose of today PC f/u visit further discussion monitor trends of appetite, weights, monitor for functional, cognitive decline with chronic disease progression, assess any active symptoms, supportive role. I  visited and observed Lydia King, talked about how she has been feeling today, ros, functional ability, no recent falls, infections. We talked about appetite, weights, quality of life, residing in facility and her room-mate who she enjoys. We talked about daily routine, book she is reading. Currently Lydia King is stable. No new changes recommended for today.  Medications, poc, goc reviewed. Therapeutic listening, emotional support provided. Attempted to contact dtg. Updated staff, no changes to goc. History obtained from review of EMR, discussion with facility staff and Lydia. Bonzo.  I reviewed available labs, medications, imaging, studies and related documents from the EMR.  Records reviewed and summarized above.   Physical Exam: General: frail appearing, elderly pleasant female ENMT: oral mucous membranes moist CV: S1S2, RRR, no LE edema Pulmonary: LCTA MSK: ambulatory with walker Skin: warm and dry Psych: non-anxious affect, A and O x 3    Thank you for the opportunity to participate in the care of Lydia. King. Please call our office at [phone] if we can be of additional assistance.   [name], NP

## 2022-05-30 DIAGNOSIS — E1159 Type 2 diabetes mellitus with other circulatory complications: Secondary | ICD-10-CM | POA: Diagnosis not present

## 2022-06-09 DIAGNOSIS — L299 Pruritus, unspecified: Secondary | ICD-10-CM | POA: Diagnosis not present

## 2022-06-12 DIAGNOSIS — B379 Candidiasis, unspecified: Secondary | ICD-10-CM | POA: Diagnosis not present

## 2022-06-16 ENCOUNTER — Emergency Department: Payer: Medicare Other

## 2022-06-16 ENCOUNTER — Inpatient Hospital Stay: Payer: Medicare Other

## 2022-06-16 ENCOUNTER — Inpatient Hospital Stay
Admission: EM | Admit: 2022-06-16 | Discharge: 2022-06-24 | DRG: 280 | Disposition: A | Discharge status: Hospice | Payer: Medicare Other | Attending: Family Medicine | Admitting: Family Medicine

## 2022-06-16 ENCOUNTER — Other Ambulatory Visit: Payer: Self-pay

## 2022-06-16 DIAGNOSIS — K259 Gastric ulcer, unspecified as acute or chronic, without hemorrhage or perforation: Secondary | ICD-10-CM | POA: Diagnosis not present

## 2022-06-16 DIAGNOSIS — Z880 Allergy status to penicillin: Secondary | ICD-10-CM

## 2022-06-16 DIAGNOSIS — Z7982 Long term (current) use of aspirin: Secondary | ICD-10-CM

## 2022-06-16 DIAGNOSIS — Z8719 Personal history of other diseases of the digestive system: Secondary | ICD-10-CM

## 2022-06-16 DIAGNOSIS — E871 Hypo-osmolality and hyponatremia: Secondary | ICD-10-CM | POA: Diagnosis not present

## 2022-06-16 DIAGNOSIS — Z66 Do not resuscitate: Secondary | ICD-10-CM | POA: Diagnosis present

## 2022-06-16 DIAGNOSIS — Z823 Family history of stroke: Secondary | ICD-10-CM

## 2022-06-16 DIAGNOSIS — N179 Acute kidney failure, unspecified: Secondary | ICD-10-CM | POA: Diagnosis present

## 2022-06-16 DIAGNOSIS — G600 Hereditary motor and sensory neuropathy: Secondary | ICD-10-CM | POA: Diagnosis present

## 2022-06-16 DIAGNOSIS — I5032 Chronic diastolic (congestive) heart failure: Secondary | ICD-10-CM | POA: Diagnosis present

## 2022-06-16 DIAGNOSIS — Z886 Allergy status to analgesic agent status: Secondary | ICD-10-CM

## 2022-06-16 DIAGNOSIS — I350 Nonrheumatic aortic (valve) stenosis: Secondary | ICD-10-CM

## 2022-06-16 DIAGNOSIS — K449 Diaphragmatic hernia without obstruction or gangrene: Secondary | ICD-10-CM | POA: Diagnosis not present

## 2022-06-16 DIAGNOSIS — I2489 Other forms of acute ischemic heart disease: Secondary | ICD-10-CM

## 2022-06-16 DIAGNOSIS — Z882 Allergy status to sulfonamides status: Secondary | ICD-10-CM

## 2022-06-16 DIAGNOSIS — Z885 Allergy status to narcotic agent status: Secondary | ICD-10-CM

## 2022-06-16 DIAGNOSIS — K219 Gastro-esophageal reflux disease without esophagitis: Secondary | ICD-10-CM | POA: Diagnosis present

## 2022-06-16 DIAGNOSIS — I959 Hypotension, unspecified: Secondary | ICD-10-CM | POA: Diagnosis present

## 2022-06-16 DIAGNOSIS — I272 Pulmonary hypertension, unspecified: Secondary | ICD-10-CM | POA: Diagnosis present

## 2022-06-16 DIAGNOSIS — Z833 Family history of diabetes mellitus: Secondary | ICD-10-CM

## 2022-06-16 DIAGNOSIS — E785 Hyperlipidemia, unspecified: Secondary | ICD-10-CM | POA: Diagnosis not present

## 2022-06-16 DIAGNOSIS — I110 Hypertensive heart disease with heart failure: Secondary | ICD-10-CM | POA: Diagnosis present

## 2022-06-16 DIAGNOSIS — I2510 Atherosclerotic heart disease of native coronary artery without angina pectoris: Secondary | ICD-10-CM

## 2022-06-16 DIAGNOSIS — D72829 Elevated white blood cell count, unspecified: Secondary | ICD-10-CM | POA: Diagnosis not present

## 2022-06-16 DIAGNOSIS — Z8673 Personal history of transient ischemic attack (TIA), and cerebral infarction without residual deficits: Secondary | ICD-10-CM | POA: Diagnosis not present

## 2022-06-16 DIAGNOSIS — I2699 Other pulmonary embolism without acute cor pulmonale: Secondary | ICD-10-CM | POA: Diagnosis present

## 2022-06-16 DIAGNOSIS — Z515 Encounter for palliative care: Secondary | ICD-10-CM

## 2022-06-16 DIAGNOSIS — R1310 Dysphagia, unspecified: Secondary | ICD-10-CM | POA: Diagnosis not present

## 2022-06-16 DIAGNOSIS — G4733 Obstructive sleep apnea (adult) (pediatric): Secondary | ICD-10-CM | POA: Diagnosis present

## 2022-06-16 DIAGNOSIS — I69318 Other symptoms and signs involving cognitive functions following cerebral infarction: Secondary | ICD-10-CM | POA: Diagnosis not present

## 2022-06-16 DIAGNOSIS — E860 Dehydration: Secondary | ICD-10-CM | POA: Diagnosis present

## 2022-06-16 DIAGNOSIS — I214 Non-ST elevation (NSTEMI) myocardial infarction: Principal | ICD-10-CM | POA: Diagnosis present

## 2022-06-16 DIAGNOSIS — I10 Essential (primary) hypertension: Secondary | ICD-10-CM | POA: Diagnosis present

## 2022-06-16 DIAGNOSIS — I4719 Other supraventricular tachycardia: Secondary | ICD-10-CM | POA: Diagnosis present

## 2022-06-16 DIAGNOSIS — M79604 Pain in right leg: Secondary | ICD-10-CM | POA: Diagnosis not present

## 2022-06-16 DIAGNOSIS — Z794 Long term (current) use of insulin: Secondary | ICD-10-CM

## 2022-06-16 DIAGNOSIS — I5033 Acute on chronic diastolic (congestive) heart failure: Secondary | ICD-10-CM | POA: Diagnosis present

## 2022-06-16 DIAGNOSIS — F419 Anxiety disorder, unspecified: Secondary | ICD-10-CM | POA: Diagnosis not present

## 2022-06-16 DIAGNOSIS — Z8049 Family history of malignant neoplasm of other genital organs: Secondary | ICD-10-CM

## 2022-06-16 DIAGNOSIS — R279 Unspecified lack of coordination: Secondary | ICD-10-CM | POA: Diagnosis not present

## 2022-06-16 DIAGNOSIS — J9601 Acute respiratory failure with hypoxia: Secondary | ICD-10-CM | POA: Diagnosis present

## 2022-06-16 DIAGNOSIS — D649 Anemia, unspecified: Secondary | ICD-10-CM | POA: Diagnosis present

## 2022-06-16 DIAGNOSIS — Z8041 Family history of malignant neoplasm of ovary: Secondary | ICD-10-CM

## 2022-06-16 DIAGNOSIS — J189 Pneumonia, unspecified organism: Secondary | ICD-10-CM | POA: Diagnosis not present

## 2022-06-16 DIAGNOSIS — E1165 Type 2 diabetes mellitus with hyperglycemia: Secondary | ICD-10-CM | POA: Diagnosis present

## 2022-06-16 DIAGNOSIS — Z86718 Personal history of other venous thrombosis and embolism: Secondary | ICD-10-CM

## 2022-06-16 DIAGNOSIS — Z955 Presence of coronary angioplasty implant and graft: Secondary | ICD-10-CM

## 2022-06-16 DIAGNOSIS — Z91041 Radiographic dye allergy status: Secondary | ICD-10-CM

## 2022-06-16 DIAGNOSIS — Z96653 Presence of artificial knee joint, bilateral: Secondary | ICD-10-CM | POA: Diagnosis present

## 2022-06-16 DIAGNOSIS — E876 Hypokalemia: Secondary | ICD-10-CM | POA: Diagnosis not present

## 2022-06-16 DIAGNOSIS — J45909 Unspecified asthma, uncomplicated: Secondary | ICD-10-CM | POA: Diagnosis present

## 2022-06-16 DIAGNOSIS — Z79899 Other long term (current) drug therapy: Secondary | ICD-10-CM

## 2022-06-16 DIAGNOSIS — F418 Other specified anxiety disorders: Secondary | ICD-10-CM | POA: Diagnosis not present

## 2022-06-16 DIAGNOSIS — G8929 Other chronic pain: Secondary | ICD-10-CM | POA: Diagnosis present

## 2022-06-16 DIAGNOSIS — F32A Depression, unspecified: Secondary | ICD-10-CM | POA: Diagnosis present

## 2022-06-16 DIAGNOSIS — E119 Type 2 diabetes mellitus without complications: Secondary | ICD-10-CM | POA: Diagnosis not present

## 2022-06-16 DIAGNOSIS — R1084 Generalized abdominal pain: Secondary | ICD-10-CM | POA: Diagnosis not present

## 2022-06-16 DIAGNOSIS — Z7985 Long-term (current) use of injectable non-insulin antidiabetic drugs: Secondary | ICD-10-CM

## 2022-06-16 DIAGNOSIS — K439 Ventral hernia without obstruction or gangrene: Secondary | ICD-10-CM | POA: Diagnosis present

## 2022-06-16 DIAGNOSIS — I5030 Unspecified diastolic (congestive) heart failure: Secondary | ICD-10-CM | POA: Diagnosis not present

## 2022-06-16 DIAGNOSIS — M79605 Pain in left leg: Secondary | ICD-10-CM | POA: Diagnosis not present

## 2022-06-16 DIAGNOSIS — Z743 Need for continuous supervision: Secondary | ICD-10-CM | POA: Diagnosis not present

## 2022-06-16 DIAGNOSIS — R54 Age-related physical debility: Secondary | ICD-10-CM | POA: Diagnosis present

## 2022-06-16 DIAGNOSIS — Z7984 Long term (current) use of oral hypoglycemic drugs: Secondary | ICD-10-CM

## 2022-06-16 DIAGNOSIS — R079 Chest pain, unspecified: Secondary | ICD-10-CM | POA: Diagnosis not present

## 2022-06-16 DIAGNOSIS — I252 Old myocardial infarction: Secondary | ICD-10-CM

## 2022-06-16 DIAGNOSIS — Z91013 Allergy to seafood: Secondary | ICD-10-CM

## 2022-06-16 DIAGNOSIS — I25118 Atherosclerotic heart disease of native coronary artery with other forms of angina pectoris: Secondary | ICD-10-CM | POA: Diagnosis not present

## 2022-06-16 DIAGNOSIS — R7989 Other specified abnormal findings of blood chemistry: Secondary | ICD-10-CM | POA: Diagnosis not present

## 2022-06-16 DIAGNOSIS — Z888 Allergy status to other drugs, medicaments and biological substances status: Secondary | ICD-10-CM

## 2022-06-16 DIAGNOSIS — E861 Hypovolemia: Secondary | ICD-10-CM | POA: Diagnosis not present

## 2022-06-16 DIAGNOSIS — I071 Rheumatic tricuspid insufficiency: Secondary | ICD-10-CM | POA: Diagnosis present

## 2022-06-16 DIAGNOSIS — K469 Unspecified abdominal hernia without obstruction or gangrene: Secondary | ICD-10-CM | POA: Diagnosis not present

## 2022-06-16 DIAGNOSIS — R110 Nausea: Secondary | ICD-10-CM | POA: Diagnosis not present

## 2022-06-16 DIAGNOSIS — Z8711 Personal history of peptic ulcer disease: Secondary | ICD-10-CM

## 2022-06-16 DIAGNOSIS — R0602 Shortness of breath: Secondary | ICD-10-CM | POA: Diagnosis not present

## 2022-06-16 DIAGNOSIS — Z881 Allergy status to other antibiotic agents status: Secondary | ICD-10-CM

## 2022-06-16 DIAGNOSIS — M25511 Pain in right shoulder: Secondary | ICD-10-CM | POA: Diagnosis not present

## 2022-06-16 DIAGNOSIS — Z811 Family history of alcohol abuse and dependence: Secondary | ICD-10-CM

## 2022-06-16 DIAGNOSIS — Z83438 Family history of other disorder of lipoprotein metabolism and other lipidemia: Secondary | ICD-10-CM

## 2022-06-16 DIAGNOSIS — Z7902 Long term (current) use of antithrombotics/antiplatelets: Secondary | ICD-10-CM

## 2022-06-16 LAB — CBC
HCT: 40 % (ref 36.0–46.0)
Hemoglobin: 13.2 g/dL (ref 12.0–15.0)
MCH: 30.8 pg (ref 26.0–34.0)
MCHC: 33 g/dL (ref 30.0–36.0)
MCV: 93.5 fL (ref 80.0–100.0)
Platelets: 140 10*3/uL — ABNORMAL LOW (ref 150–400)
RBC: 4.28 MIL/uL (ref 3.87–5.11)
RDW: 13 % (ref 11.5–15.5)
WBC: 17.9 10*3/uL — ABNORMAL HIGH (ref 4.0–10.5)
nRBC: 0 % (ref 0.0–0.2)

## 2022-06-16 LAB — BRAIN NATRIURETIC PEPTIDE: B Natriuretic Peptide: 1311.2 pg/mL — ABNORMAL HIGH (ref 0.0–100.0)

## 2022-06-16 LAB — BASIC METABOLIC PANEL
Anion gap: 12 (ref 5–15)
BUN: 43 mg/dL — ABNORMAL HIGH (ref 8–23)
CO2: 22 mmol/L (ref 22–32)
Calcium: 9.6 mg/dL (ref 8.9–10.3)
Chloride: 96 mmol/L — ABNORMAL LOW (ref 98–111)
Creatinine, Ser: 1.41 mg/dL — ABNORMAL HIGH (ref 0.44–1.00)
GFR, Estimated: 36 mL/min — ABNORMAL LOW (ref >60)
Glucose, Bld: 228 mg/dL — ABNORMAL HIGH (ref 70–99)
Potassium: 3.9 mmol/L (ref 3.5–5.1)
Sodium: 130 mmol/L — ABNORMAL LOW (ref 135–145)

## 2022-06-16 LAB — APTT: aPTT: 34 seconds (ref 24–36)

## 2022-06-16 LAB — URINALYSIS, W/ REFLEX TO CULTURE (INFECTION SUSPECTED)
Bilirubin Urine: NEGATIVE
Glucose, UA: NEGATIVE mg/dL
Hgb urine dipstick: NEGATIVE
Ketones, ur: NEGATIVE mg/dL
Nitrite: NEGATIVE
Protein, ur: NEGATIVE mg/dL
Specific Gravity, Urine: 1.025 (ref 1.005–1.030)
WBC, UA: >50 WBC/hpf (ref 0–5)
pH: 5 (ref 5.0–8.0)

## 2022-06-16 LAB — PROTIME-INR
INR: 1.3 — ABNORMAL HIGH (ref 0.8–1.2)
Prothrombin Time: 16.6 seconds — ABNORMAL HIGH (ref 11.4–15.2)

## 2022-06-16 LAB — LACTIC ACID, PLASMA
Lactic Acid, Venous: 2.6 mmol/L (ref 0.5–1.9)
Lactic Acid, Venous: 2.6 mmol/L (ref 0.5–1.9)

## 2022-06-16 LAB — TROPONIN I (HIGH SENSITIVITY)
Troponin I (High Sensitivity): 415 ng/L (ref <18)
Troponin I (High Sensitivity): 398 ng/L (ref <18)
Troponin I (High Sensitivity): 307 ng/L (ref <18)

## 2022-06-16 MED ORDER — FAMOTIDINE 20 MG PO TABS
20.0000 mg | ORAL_TABLET | Freq: Every day | ORAL | Status: DC
  Administered 2022-06-17 – 2022-06-24 (×6): 20 mg via ORAL
  Filled 2022-06-16 (×6): qty 1

## 2022-06-16 MED ORDER — POLYVINYL ALCOHOL 1.4 % OP SOLN
1.0000 [drp] | Freq: Every day | OPHTHALMIC | Status: DC
  Administered 2022-06-17 – 2022-06-24 (×30): 1 [drp] via OPHTHALMIC
  Filled 2022-06-16 (×2): qty 15

## 2022-06-16 MED ORDER — CARVEDILOL 6.25 MG PO TABS
6.2500 mg | ORAL_TABLET | Freq: Two times a day (BID) | ORAL | Status: DC
  Administered 2022-06-17 (×2): 6.25 mg via ORAL
  Filled 2022-06-16 (×2): qty 1

## 2022-06-16 MED ORDER — OXYCODONE-ACETAMINOPHEN 5-325 MG PO TABS
1.0000 | ORAL_TABLET | Freq: Four times a day (QID) | ORAL | Status: DC | PRN
  Administered 2022-06-17 – 2022-06-24 (×11): 1 via ORAL
  Filled 2022-06-16 (×11): qty 1

## 2022-06-16 MED ORDER — ALBUTEROL SULFATE (2.5 MG/3ML) 0.083% IN NEBU
3.0000 mL | INHALATION_SOLUTION | RESPIRATORY_TRACT | Status: DC | PRN
  Administered 2022-06-16 – 2022-06-24 (×5): 3 mL via RESPIRATORY_TRACT
  Filled 2022-06-16 (×5): qty 3

## 2022-06-16 MED ORDER — FENTANYL CITRATE PF 50 MCG/ML IJ SOSY
12.5000 ug | PREFILLED_SYRINGE | INTRAMUSCULAR | Status: DC | PRN
  Administered 2022-06-17: 12.5 ug via INTRAVENOUS
  Filled 2022-06-16: qty 1

## 2022-06-16 MED ORDER — CALCIUM CARBONATE ANTACID 500 MG PO CHEW
12.0000 | CHEWABLE_TABLET | Freq: Three times a day (TID) | ORAL | Status: DC
  Administered 2022-06-17: 1000 mg via ORAL
  Filled 2022-06-16: qty 12

## 2022-06-16 MED ORDER — NITROGLYCERIN 0.4 MG SL SUBL
0.4000 mg | SUBLINGUAL_TABLET | SUBLINGUAL | Status: DC | PRN
  Administered 2022-06-17: 0.4 mg via SUBLINGUAL
  Filled 2022-06-16 (×3): qty 1

## 2022-06-16 MED ORDER — FAMOTIDINE IN NACL 20-0.9 MG/50ML-% IV SOLN
20.0000 mg | Freq: Once | INTRAVENOUS | Status: AC
  Administered 2022-06-16: 20 mg via INTRAVENOUS
  Filled 2022-06-16: qty 50

## 2022-06-16 MED ORDER — HYDRALAZINE HCL 20 MG/ML IJ SOLN: 5.0000 mg | INTRAMUSCULAR | Status: DC | PRN

## 2022-06-16 MED ORDER — LYSINE 500 MG PO CAPS: 1000.0000 mg | ORAL_CAPSULE | Freq: Three times a day (TID) | ORAL | Status: DC

## 2022-06-16 MED ORDER — ASPIRIN 81 MG PO TBEC
81.0000 mg | DELAYED_RELEASE_TABLET | Freq: Every day | ORAL | Status: DC
  Administered 2022-06-17 – 2022-06-24 (×8): 81 mg via ORAL
  Filled 2022-06-16 (×8): qty 1

## 2022-06-16 MED ORDER — BACLOFEN 10 MG PO TABS
5.0000 mg | ORAL_TABLET | Freq: Every day | ORAL | Status: DC
  Administered 2022-06-17 – 2022-06-24 (×8): 5 mg via ORAL
  Filled 2022-06-16: qty 1
  Filled 2022-06-16 (×7): qty 0.5

## 2022-06-16 MED ORDER — ONDANSETRON HCL 4 MG/2ML IJ SOLN
4.0000 mg | Freq: Once | INTRAMUSCULAR | Status: AC
  Administered 2022-06-16: 4 mg via INTRAVENOUS
  Filled 2022-06-16: qty 2

## 2022-06-16 MED ORDER — INSULIN GLARGINE-YFGN 100 UNIT/ML ~~LOC~~ SOLN
3.0000 [IU] | Freq: Every day | SUBCUTANEOUS | Status: DC
  Administered 2022-06-17 – 2022-06-22 (×6): 3 [IU] via SUBCUTANEOUS
  Filled 2022-06-16 (×8): qty 0.03

## 2022-06-16 MED ORDER — HEPARIN (PORCINE) 25000 UT/250ML-% IV SOLN
600.0000 [IU]/h | INTRAVENOUS | Status: DC
  Administered 2022-06-16: 600 [IU]/h via INTRAVENOUS
  Filled 2022-06-16: qty 250

## 2022-06-16 MED ORDER — OCUVITE-LUTEIN PO CAPS
2.0000 | ORAL_CAPSULE | Freq: Every day | ORAL | Status: DC
  Filled 2022-06-16: qty 2

## 2022-06-16 MED ORDER — FLUOXETINE HCL 20 MG PO CAPS
30.0000 mg | ORAL_CAPSULE | Freq: Every day | ORAL | Status: DC
  Administered 2022-06-17 – 2022-06-24 (×8): 30 mg via ORAL
  Filled 2022-06-16 (×8): qty 1

## 2022-06-16 MED ORDER — BUDESONIDE 0.25 MG/2ML IN SUSP
0.2500 mg | Freq: Once | RESPIRATORY_TRACT | Status: AC
  Administered 2022-06-16: 0.25 mg via RESPIRATORY_TRACT
  Filled 2022-06-16: qty 2

## 2022-06-16 MED ORDER — INSULIN ASPART 100 UNIT/ML IJ SOLN
0.0000 [IU] | Freq: Every day | INTRAMUSCULAR | Status: DC
  Administered 2022-06-17: 2 [IU] via SUBCUTANEOUS
  Administered 2022-06-19: 3 [IU] via SUBCUTANEOUS
  Administered 2022-06-21 – 2022-06-22 (×2): 2 [IU] via SUBCUTANEOUS
  Filled 2022-06-16 (×4): qty 1

## 2022-06-16 MED ORDER — ACETAMINOPHEN 325 MG PO TABS
650.0000 mg | ORAL_TABLET | Freq: Four times a day (QID) | ORAL | Status: DC | PRN
  Administered 2022-06-16 – 2022-06-22 (×7): 650 mg via ORAL
  Filled 2022-06-16 (×7): qty 2

## 2022-06-16 MED ORDER — IPRATROPIUM-ALBUTEROL 0.5-2.5 (3) MG/3ML IN SOLN
3.0000 mL | Freq: Once | RESPIRATORY_TRACT | Status: AC
  Administered 2022-06-16: 3 mL via RESPIRATORY_TRACT
  Filled 2022-06-16: qty 3

## 2022-06-16 MED ORDER — DM-GUAIFENESIN ER 30-600 MG PO TB12
1.0000 | ORAL_TABLET | Freq: Two times a day (BID) | ORAL | Status: DC | PRN
  Administered 2022-06-20: 1 via ORAL
  Filled 2022-06-16 (×2): qty 1

## 2022-06-16 MED ORDER — SODIUM CHLORIDE 0.9 % IV BOLUS
1000.0000 mL | Freq: Once | INTRAVENOUS | Status: AC
  Administered 2022-06-16: 1000 mL via INTRAVENOUS

## 2022-06-16 MED ORDER — LIDOCAINE 5 % EX PTCH
1.0000 | MEDICATED_PATCH | CUTANEOUS | Status: DC
  Administered 2022-06-18 – 2022-06-24 (×7): 1 via TRANSDERMAL
  Filled 2022-06-16 (×9): qty 1

## 2022-06-16 MED ORDER — INSULIN ASPART 100 UNIT/ML IJ SOLN
0.0000 [IU] | Freq: Three times a day (TID) | INTRAMUSCULAR | Status: DC
  Administered 2022-06-17: 7 [IU] via SUBCUTANEOUS
  Administered 2022-06-17 – 2022-06-18 (×2): 3 [IU] via SUBCUTANEOUS
  Administered 2022-06-18 (×2): 2 [IU] via SUBCUTANEOUS
  Administered 2022-06-19: 3 [IU] via SUBCUTANEOUS
  Administered 2022-06-19: 2 [IU] via SUBCUTANEOUS
  Administered 2022-06-19 – 2022-06-20 (×3): 3 [IU] via SUBCUTANEOUS
  Administered 2022-06-20 – 2022-06-21 (×2): 5 [IU] via SUBCUTANEOUS
  Administered 2022-06-21: 3 [IU] via SUBCUTANEOUS
  Administered 2022-06-21 – 2022-06-22 (×2): 2 [IU] via SUBCUTANEOUS
  Administered 2022-06-22: 5 [IU] via SUBCUTANEOUS
  Administered 2022-06-22 – 2022-06-23 (×3): 3 [IU] via SUBCUTANEOUS
  Administered 2022-06-23: 5 [IU] via SUBCUTANEOUS
  Administered 2022-06-24: 2 [IU] via SUBCUTANEOUS
  Filled 2022-06-16 (×20): qty 1

## 2022-06-16 MED ORDER — HEPARIN BOLUS VIA INFUSION
3000.0000 [IU] | Freq: Once | INTRAVENOUS | Status: AC
Start: 2022-06-16 — End: 2022-06-16
  Administered 2022-06-16: 3000 [IU] via INTRAVENOUS
  Filled 2022-06-16: qty 3000

## 2022-06-16 MED ORDER — CARBOXYMETHYLCELLULOSE SOD PF 1 % OP GEL: 1.0000 [drp] | Freq: Every day | OPHTHALMIC | Status: DC

## 2022-06-16 MED ORDER — POLYETHYLENE GLYCOL 3350 17 G PO PACK
17.0000 g | PACK | Freq: Every day | ORAL | Status: DC | PRN
  Administered 2022-06-22 – 2022-06-24 (×2): 17 g via ORAL
  Filled 2022-06-16 (×2): qty 1

## 2022-06-16 MED ORDER — ONDANSETRON HCL 4 MG/2ML IJ SOLN: 4.0000 mg | Freq: Three times a day (TID) | INTRAMUSCULAR | Status: DC | PRN

## 2022-06-16 MED ORDER — FLUCONAZOLE 100 MG PO TABS
100.0000 mg | ORAL_TABLET | ORAL | Status: DC
  Administered 2022-06-17: 100 mg via ORAL
  Filled 2022-06-16: qty 1

## 2022-06-16 MED ORDER — MELATONIN 5 MG PO TABS
2.5000 mg | ORAL_TABLET | Freq: Every day | ORAL | Status: DC
  Administered 2022-06-17 – 2022-06-24 (×6): 2.5 mg via ORAL
  Filled 2022-06-16 (×6): qty 1

## 2022-06-16 MED ORDER — ALUM & MAG HYDROXIDE-SIMETH 200-200-20 MG/5ML PO SUSP
15.0000 mL | Freq: Once | ORAL | Status: AC
  Administered 2022-06-16: 15 mL via ORAL
  Filled 2022-06-16: qty 30

## 2022-06-16 NOTE — ED Notes (Signed)
Lydia Lundborg, MD, made aware of lactic 2.6

## 2022-06-16 NOTE — Consult Note (Signed)
ANTICOAGULATION CONSULT NOTE  Pharmacy Consult for IV Heparin Indication: chest pain/ACS  Patient Measurements: Height: 4\' 10"  (147.3 cm) Weight: 58 kg (127 lb 13.9 oz) IBW/kg (Calculated) : 40.9 Heparin Dosing Weight: 53.2 kg  Labs: Recent Labs    06/16/22 1454  HGB 13.2  HCT 40.0  PLT 140*  CREATININE 1.41*  TROPONINIHS 415*   Estimated Creatinine Clearance: 20.4 mL/min (A) (by C-G formula based on SCr of 1.41 mg/dL (H)).  Medical History: Past Medical History:  Diagnosis Date   (HFpEF) heart failure with preserved ejection fraction (HCC)    a. 06/2019 Echo ([facility]): EF >55%, mild conc LVH. Mild-mod AS, mild MR/TR.   Anemia    in past (from blood loss at hiatal hernia)   Asthma    CAD (coronary artery disease)    a. 05/2019 MI/PCI: ost LCX (rota + cba-->3.0x16 Synergy DES), LAD 90 (3.0x16 Synergy DES).   Charcot-Marie-Tooth disease    walks with cane (fairly controlled)   Diverticulosis    DVT (deep venous thrombosis) (HCC) 2003   after sx   GERD (gastroesophageal reflux disease)    Grief reaction    on fluoxetine   Headache(784.0)    migraine   Hiatal hernia    Hyperlipidemia    Hypertension    OA (ocular albinism) (HCC)    Old myocardial infarction    Osteopenia    PUD (peptic ulcer disease)    gastric, past   Shingles    Stroke (HCC) 8/10   posterior reversible encephalopathy syndrome   Urine incontinence     Medications:  No anticoagulation prior to admission per my chart review. Medication reconciliation is pending  Assessment: 89 y/o F with medical history as above presenting to the ED 5/20 with chest pain. Troponin elevated, concern for ACS. Pharmacy consulted to initiate and manage heparin infusion for ACS.  Baseline CBC overall within normal limits. Baseline aPTT and PT-INR are pending.   Goal of Therapy:  Heparin level 0.3-0.7 units/ml Monitor platelets by anticoagulation protocol: Yes   Plan:  --Heparin 3000 unit IV bolus followed by  continuous infusion at 600 units/hr --Heparin level 8 hours from initiation of infusion --Daily CBC per protocol while on IV heparin  Tressie Ellis 06/16/2022,4:25 PM

## 2022-06-16 NOTE — ED Provider Notes (Signed)
[facility] Provider Note    Event Date/Time   First MD Initiated Contact with Patient 06/16/22 1530     (approximate)   History   Chest Pain   HPI  RAFEEF King is a 89 y.o. female with a history of DVT no longer on anticoagulation, CAD, stroke, asthma, anemia, hypertension, hyperlipidemia, and hiatal hernia who presents with chest pain for the last 3 days, persistent course, mainly in the left side of her chest, described as both pressure and sharp, and associated with nausea and belching as well as radiation to her back.  This feels similar to prior chest pain caused by her hiatal hernia.  She denies any vomiting.  She had some upper abdominal pain earlier but this is improved.  She denies any shortness of breath.  She does not feel dizzy or lightheaded.  She denies any acute leg swelling.  I reviewed the past medical records.  The patient was most recently seen by palliative care for routine follow-up on 4/23 of this year.  Previously she was seen in the ED on 11/15 of last year with atypical chest pain and a negative ED workup.   Physical Exam   Triage Vital Signs: ED Triage Vitals  Enc Vitals Group     BP 06/16/22 1446 (!) 98/44     Pulse Rate 06/16/22 1446 72     Resp 06/16/22 1453 16     Temp 06/16/22 1446 97.9 F (36.6 C)     Temp Source 06/16/22 1446 Oral     SpO2 06/16/22 1446 93 %     Weight 06/16/22 1445 127 lb 13.9 oz (58 kg)     Height 06/16/22 1445 4\' 10"  (1.473 m)     Head Circumference --      Peak Flow --      Pain Score 06/16/22 1445 8     Pain Loc --      Pain Edu? --      Excl. in GC? --     Most recent vital signs: Vitals:   06/16/22 2255 06/16/22 2330  BP:  91/62  Pulse: 75 72  Resp: 19 16  Temp:    SpO2: 96% 95%     General: Awake, no distress.  CV:  Good peripheral perfusion.  Normal heart sounds. Resp:  Normal effort.  Lungs CTAB. Abd:  Soft and nontender.  No distention.  Other:  No peripheral  edema.   ED Results / Procedures / Treatments   Labs (all labs ordered are listed, but only abnormal results are displayed) Labs Reviewed  BASIC METABOLIC PANEL - Abnormal; Notable for the following components:      Result Value   Sodium 130 (*)    Chloride 96 (*)    Glucose, Bld 228 (*)    BUN 43 (*)    Creatinine, Ser 1.41 (*)    GFR, Estimated 36 (*)    All other components within normal limits  CBC - Abnormal; Notable for the following components:   WBC 17.9 (*)    Platelets 140 (*)    All other components within normal limits  BRAIN NATRIURETIC PEPTIDE - Abnormal; Notable for the following components:   B Natriuretic Peptide 1,311.2 (*)    All other components within normal limits  PROTIME-INR - Abnormal; Notable for the following components:   Prothrombin Time 16.6 (*)    INR 1.3 (*)    All other components within normal limits  LACTIC ACID, PLASMA -  Abnormal; Notable for the following components:   Lactic Acid, Venous 2.6 (*)    All other components within normal limits  LACTIC ACID, PLASMA - Abnormal; Notable for the following components:   Lactic Acid, Venous 2.6 (*)    All other components within normal limits  URINALYSIS, W/ REFLEX TO CULTURE (INFECTION SUSPECTED) - Abnormal; Notable for the following components:   Color, Urine YELLOW (*)    APPearance HAZY (*)    Leukocytes,Ua LARGE (*)    Bacteria, UA RARE (*)    Non Squamous Epithelial PRESENT (*)    All other components within normal limits  TROPONIN I (HIGH SENSITIVITY) - Abnormal; Notable for the following components:   Troponin I (High Sensitivity) 415 (*)    All other components within normal limits  TROPONIN I (HIGH SENSITIVITY) - Abnormal; Notable for the following components:   Troponin I (High Sensitivity) 398 (*)    All other components within normal limits  TROPONIN I (HIGH SENSITIVITY) - Abnormal; Notable for the following components:   Troponin I (High Sensitivity) 307 (*)    All other  components within normal limits  CULTURE, BLOOD (ROUTINE X 2)  CULTURE, BLOOD (ROUTINE X 2)  APTT  HEPARIN LEVEL (UNFRACTIONATED)  CBC  HEMOGLOBIN A1C  LIPID PANEL  BASIC METABOLIC PANEL  TROPONIN I (HIGH SENSITIVITY)     EKG  ED ECG REPORT I, Dionne Bucy, the attending physician, personally viewed and interpreted this ECG.  Date: 06/16/2022 EKG Time: 1447 Rate: 74 Rhythm: normal sinus rhythm QRS Axis: normal Intervals: normal ST/T Wave abnormalities: Nonspecific T-wave abnormalities Narrative Interpretation: no evidence of acute ischemia    RADIOLOGY  Chest x-ray: I independently viewed and interpreted the images; there is no focal consolidation or edema  PROCEDURES:  Critical Care performed: Yes, see critical care procedure note(s)  .Critical Care  Performed by: Dionne Bucy, MD Authorized by: Dionne Bucy, MD   Critical care provider statement:    Critical care time (minutes):  30   Critical care time was exclusive of:  Separately billable procedures and treating other patients   Critical care was necessary to treat or prevent imminent or life-threatening deterioration of the following conditions:  Cardiac failure   Critical care was time spent personally by me on the following activities:  Development of treatment plan with patient or surrogate, discussions with consultants, evaluation of patient's response to treatment, examination of patient, ordering and review of laboratory studies, ordering and review of radiographic studies, ordering and performing treatments and interventions, pulse oximetry, re-evaluation of patient's condition and review of old charts   Care discussed with: admitting provider      MEDICATIONS ORDERED IN ED: Medications  heparin bolus via infusion 3,000 Units (3,000 Units Intravenous Bolus from Bag 06/16/22 1738)    Followed by  heparin ADULT infusion 100 units/mL (25000 units/243mL) (600 Units/hr Intravenous New  Bag/Given 06/16/22 1738)  nitroGLYCERIN (NITROSTAT) SL tablet 0.4 mg (has no administration in time range)  fentaNYL (SUBLIMAZE) injection 12.5 mcg (has no administration in time range)  albuterol (PROVENTIL) (2.5 MG/3ML) 0.083% nebulizer solution 3 mL (3 mLs Nebulization Given 06/16/22 2030)  dextromethorphan-guaiFENesin (MUCINEX DM) 30-600 MG per 12 hr tablet 1 tablet (has no administration in time range)  insulin aspart (novoLOG) injection 0-5 Units (has no administration in time range)  insulin aspart (novoLOG) injection 0-9 Units (has no administration in time range)  ondansetron (ZOFRAN) injection 4 mg (has no administration in time range)  hydrALAZINE (APRESOLINE) injection 5 mg (has  no administration in time range)  acetaminophen (TYLENOL) tablet 650 mg (650 mg Oral Given 06/16/22 1854)  aspirin EC tablet 81 mg (has no administration in time range)  oxyCODONE-acetaminophen (PERCOCET/ROXICET) 5-325 MG per tablet 1 tablet (has no administration in time range)  fluconazole (DIFLUCAN) tablet 100 mg (has no administration in time range)  carvedilol (COREG) tablet 6.25 mg (has no administration in time range)  FLUoxetine (PROZAC) capsule 30 mg (has no administration in time range)  insulin glargine-yfgn (SEMGLEE) injection 3 Units (has no administration in time range)  calcium carbonate (TUMS - dosed in mg elemental calcium) chewable tablet 2,400 mg of elemental calcium (has no administration in time range)  famotidine (PEPCID) tablet 20 mg (has no administration in time range)  polyethylene glycol (MIRALAX / GLYCOLAX) packet 17 g (has no administration in time range)  melatonin tablet 2.5 mg (has no administration in time range)  baclofen (LIORESAL) tablet 5 mg (has no administration in time range)  multivitamin-lutein (OCUVITE-LUTEIN) capsule 2 capsule (has no administration in time range)  polyvinyl alcohol (LIQUIFILM TEARS) 1.4 % ophthalmic solution 1 drop (has no administration in time  range)  lidocaine (LIDODERM) 5 % 1 patch (has no administration in time range)  ondansetron (ZOFRAN) injection 4 mg (4 mg Intravenous Given 06/16/22 1600)  famotidine (PEPCID) IVPB 20 mg premix (0 mg Intravenous Stopped 06/16/22 1645)  alum & mag hydroxide-simeth (MAALOX/MYLANTA) 200-200-20 MG/5ML suspension 15 mL (15 mLs Oral Given 06/16/22 1558)  sodium chloride 0.9 % bolus 1,000 mL (0 mLs Intravenous Stopped 06/16/22 2020)  ipratropium-albuterol (DUONEB) 0.5-2.5 (3) MG/3ML nebulizer solution 3 mL (3 mLs Nebulization Given 06/16/22 2141)  budesonide (PULMICORT) nebulizer solution 0.25 mg (0.25 mg Nebulization Given 06/16/22 2142)     IMPRESSION / MDM / ASSESSMENT AND PLAN / ED COURSE  I reviewed the triage vital signs and the nursing notes.  89 year old female with PMH as noted above including a history of CAD as well as DVT provoked after surgery, no longer on anticoagulation, taking Plavix, who presents with chest pain and nausea for the last 3 days.  Initial lab workup reveals elevated troponin to 415.  Labs are also significant for elevated WBC count.  EKG shows nonspecific T wave abnormalities but no ischemic findings.  Differential diagnosis includes, but is not limited to, ACS, demand ischemia, CHF exacerbation, GERD, hiatal hernia.  I have a low suspicion for PE.  The patient has no tachycardia or hypoxia and no evidence of acute DVT.  There is also no significant evidence for aortic dissection or other vascular etiology.  We will obtain lab workup including BNP to rule out acute CHF, serial troponins, and I will consult cardiology for further recommendations.  Patient's presentation is most consistent with acute presentation with potential threat to life or bodily function.  The patient is on the cardiac monitor to evaluate for evidence of arrhythmia and/or significant heart rate changes.   ----------------------------------------- 5:45 PM on  06/16/2022 -----------------------------------------  I discussed case with Dr. Kirke Corin from cardiology who recommended starting the patient on heparin for NSTEMI.  I then consulted Dr. Clyde Lundborg from the hospitalist service; based our discussion he agreed to evaluate the patient for admission.   FINAL CLINICAL IMPRESSION(S) / ED DIAGNOSES   Final diagnoses:  NSTEMI (non-ST elevated myocardial infarction) (HCC)     Rx / DC Orders   ED Discharge Orders     None        Note:  This document was prepared using Dragon voice recognition  software and may include unintentional dictation errors.    Dionne Bucy, MD 06/16/22 [id number]

## 2022-06-16 NOTE — ED Notes (Addendum)
RA sats were around 88-89%- now 95% on 2 L [place]. Attending updated.

## 2022-06-16 NOTE — ED Notes (Signed)
CRITICAL VALUE STICKER  CRITICAL VALUE:Trop 415  DATE & TIME NOTIFIED: 1530  MD NOTIFIED: Siadecki  TIME OF NOTIFICATION:1535  RESPONSE:  Verbal acknowledge

## 2022-06-16 NOTE — H&P (Signed)
History and Physical    Lydia King ZOX:[id number] DOB: Jan 14, 1931 DOA: 06/16/2022  Referring MD/NP/PA:   PCP: Judy Pimple, MD   Patient coming from:  The patient is coming from SNF   Chief Complaint: chest pain  HPI: Lydia King is a 89 y.o. female with medical history significant of DVT no longer on anticoagulation, CAD, stent placement, stroke, asthma, anemia, hypertension, hyperlipidemia, hiatal hernia, GIB, depression with anxiety, gastric ulcer, diastolic CHF, OSA on CPAP, aortic stenosis, who presents with chest pain.  Patient states that she has chest pain for almost 3 days. It is located in the left side of chest, squeezing pressure-like pain, moderate, radiating to the back, pleuritic, aggravated by deep breath.  Patient has mild dry cough, no shortness of breath at rest, but has shortness breath on exertion.  No fever or chills.  Patient has nausea, dry heaves, no diarrhea.  Patient had mild suprapubic abdominal discomfort, which has resolved.  Has burning on urination, denies dysuria or hematuria.  No recent fall or head injury.  Denies dark stool or rectal bleeding. The patient was most recently seen by palliative care for routine follow-up on 4/23 of this year.    Data reviewed independently and ED Course: pt was found to have troponin level 415--> 398, BNP 1311, WBC 17.9, lactic acid 2.6, AKI with creatinine 1.41, BUN 43, GFR 36 (recent baseline creatinine 0.95 on 12/11/2021), temperature normal, blood pressure 115/92, heart rate 78, RR 16, oxygen saturation 93% on room air.  Chest x-ray showed a large hiatal hernia.  Patient is admitted to telemetry bed as inpatient.  Dr. Kirke Corin of cardiology is consulted.  EKG: I have personally reviewed.  Sinus rhythm, QTc 437, nonspecific T wave change.   Review of Systems:   General: no fevers, chills, no body weight gain, has fatigue HEENT: no blurry vision, hearing changes or sore throat Respiratory: has exertional SOB, dry  cough, no wheezing CV: has chest pain, no palpitations GI: Has nausea, belching, suprapubic abdominal pain, no diarrhea GU: no dysuria, has burning on urination, no increased urinary frequency, hematuria  Ext: Has trace leg edema Neuro: no unilateral weakness, numbness, or tingling, no vision change or hearing loss Skin: no rash, no skin tear. MSK: No muscle spasm, no deformity, no limitation of range of movement in spin Heme: No easy bruising.  Travel history: No recent long distant travel.   Allergy:  Allergies  Allergen Reactions   Morphine And Codeine Anaphylaxis   Other Shortness Of Breath and Other (See Comments)   Shrimp [Shellfish Allergy] Anaphylaxis    Anaphylaxis-swelling of throat/sob     Amlodipine    Amlodipine Besylate     REACTION: edema   Atorvastatin     REACTION: pain- could not walk   Ciprofloxacin Other (See Comments)    REACTION: unkown Trouble breathing   Influenza Vaccines Other (See Comments)    Arm swelling, redness, chills, fever   Iodinated Contrast Media    Naproxen Other (See Comments)    Throat swelling   Nitrofurantoin    Omeprazole     REACTION: not effective   Pantoprazole Sodium     REACTION: does not work well   Pantoprazole Sodium    Penicillin G Hives    ITCHY RASH   Penicillins     REACTION: itchy rash   Simvastatin     REACTION: leg pain   Sulfa Antibiotics    Sulfonamide Derivatives     REACTION: itchy rash  Codeine Rash    REACTION: itchy rash ITCHY RASH, also breathing trouble   Prednisone Other (See Comments)    REACTION: RASH.// SHORTNESS OF BREATH Prior possible flushing reaction in face, but tolerated it in 2020 and 2021 up to 20mg     Past Medical History:  Diagnosis Date   (HFpEF) heart failure with preserved ejection fraction (HCC)    a. 06/2019 Echo ([facility]): EF >55%, mild conc LVH. Mild-mod AS, mild MR/TR.   Anemia    in past (from blood loss at hiatal hernia)   Asthma    CAD (coronary artery disease)     a. 05/2019 MI/PCI: ost LCX (rota + cba-->3.0x16 Synergy DES), LAD 90 (3.0x16 Synergy DES).   Charcot-Marie-Tooth disease    walks with cane (fairly controlled)   Diverticulosis    DVT (deep venous thrombosis) (HCC) 2003   after sx   GERD (gastroesophageal reflux disease)    Grief reaction    on fluoxetine   Headache(784.0)    migraine   Hiatal hernia    Hyperlipidemia    Hypertension    OA (ocular albinism) (HCC)    Old myocardial infarction    Osteopenia    PUD (peptic ulcer disease)    gastric, past   Shingles    Stroke (HCC) 8/10   posterior reversible encephalopathy syndrome   Urine incontinence     Past Surgical History:  Procedure Laterality Date   carotid dopplers  8/10   COLONOSCOPY  2002   HERNIA REPAIR  2002   bleeding   HIATAL HERNIA REPAIR  2000   needed blood transfusion   Stroke  8/10   hosp stroke/ posterior (vs posterior reversible encephalopathy syndrome) - d/c on Plavix   TEE normal  8/10   no emoblic source   TOTAL KNEE ARTHROPLASTY  03,06   Bilateral   VESICOVAGINAL FISTULA CLOSURE W/ TAH  1964   with bleeding and cervical dysplasia    Social History:  reports that she has never smoked. She has never used smokeless tobacco. She reports that she does not drink alcohol and does not use drugs.  Family History:  Family History  Problem Relation Age of Onset   Cerebral aneurysm Son    Cerebral aneurysm Father    Hyperlipidemia Father    Alcohol abuse Father    Stroke Mother    Cervical cancer Sister    Hyperlipidemia Brother    Alcohol abuse Brother    Stroke Brother    Diabetes Brother    Diabetes Sister    Sarcoidosis Sister    Other Sister        OP/ Broken hip   Other Other        whole family has charcot marie tooth   Ovarian cancer Sister      Prior to Admission medications   Medication Sig Start Date End Date Taking? Authorizing Provider  acetaminophen (TYLENOL) 500 MG tablet Take 2 tablets (1,000 mg total) by mouth in the  morning, at noon, and at bedtime. Patient not taking: Reported on 05/17/2021 11/04/19   Enedina Finner, MD  aspirin EC 81 MG EC tablet Take 1 tablet (81 mg total) by mouth daily. Swallow whole. 08/05/19   Osvaldo Shipper, MD  calcium carbonate (TUMS - DOSED IN MG ELEMENTAL CALCIUM) 500 MG chewable tablet Chew 12 tablets (2,400 mg of elemental calcium total) by mouth 3 (three) times daily with meals. 05/17/21   Tower, Audrie Gallus, MD  Capsaicin-Menthol (SALONPAS PAIN REL GEL-PTCH HOT) 0.025-1.25 %  PTCH Apply topically. Patient not taking: Reported on 05/17/2021    [provider]  carvedilol (COREG) 6.25 MG tablet Take 1 tablet (6.25 mg total) by mouth 2 (two) times daily with a meal. 11/04/19   Enedina Finner, MD  Cholecalciferol (VITAMIN D) 2000 UNITS CAPS Take 2 capsules by mouth daily. Patient not taking: Reported on 05/17/2021    [provider]  cloNIDine (CATAPRES) 0.1 MG tablet Take 0.1 mg by mouth 3 (three) times daily as needed. Patient not taking: Reported on 05/17/2021    [provider]  clopidogrel (PLAVIX) 75 MG tablet TAKE 1 TABLET BY MOUTH EVERY DAY Patient not taking: Reported on 05/17/2021 07/26/19   Tower, Audrie Gallus, MD  cyanocobalamin 1000 MCG tablet Take 1,000 mcg by mouth daily. Patient not taking: Reported on 05/17/2021    [provider]  dexlansoprazole (DEXILANT) 60 MG capsule Take 1 capsule (60 mg total) by mouth daily. Patient not taking: Reported on 05/17/2021 07/20/20   Wyline Mood, MD  diclofenac Sodium (VOLTAREN) 1 % GEL Apply 2 g topically 3 (three) times daily. 03/28/20   [provider]  EPINEPHrine 0.3 mg/0.3 mL IJ SOAJ injection Inject 0.3 mg into the muscle as needed for anaphylaxis. Patient not taking: Reported on 05/17/2021    [provider]  esomeprazole (NEXIUM) 40 MG capsule Take 40 mg by mouth daily at 12 noon. Patient not taking: Reported on 05/17/2021 07/17/20   [provider]  famotidine (PEPCID) 20 MG tablet Take  20 mg by mouth 2 (two) times daily.    [provider]  FLUoxetine (PROZAC) 20 MG capsule Take 20 mg by mouth daily. 11/20/19   [provider]  furosemide (LASIX) 40 MG tablet Take 1 tablet (40 mg total) by mouth daily. 11/09/19   Tower, Audrie Gallus, MD  isosorbide mononitrate (IMDUR) 30 MG 24 hr tablet Take 1 tablet (30 mg total) by mouth 2 (two) times daily. 11/04/19 09/17/20  Enedina Finner, MD  lidocaine (LMX) 4 % cream Apply 1 application topically in the morning and at bedtime. To low back and shoulder areas Patient not taking: Reported on 05/17/2021 11/11/19   Tower, Idamae Schuller A, MD  lisinopril (ZESTRIL) 20 MG tablet Take 40 mg by mouth daily.    [provider]  Lysine 500 MG CAPS Take 2 caps by mouth three times daily 05/17/21   Tower, Audrie Gallus, MD  magic mouthwash SOLN Take 15 mLs by mouth 3 (three) times daily as needed for mouth pain. 1:1:1% Lido/Diphen/Maalox    [provider]  melatonin 3 MG TABS tablet Take 3 mg by mouth at bedtime.    [provider]  Menthol-Methyl Salicylate (SALONPAS PAIN RELIEF PATCH EX) Apply 1 patch topically daily. Patient not taking: Reported on 05/17/2021    [provider]  metFORMIN (GLUCOPHAGE XR) 500 MG 24 hr tablet Take 1 tablet (500 mg total) by mouth daily with breakfast. 11/04/19 11/03/20  Enedina Finner, MD  Multiple Vitamins-Minerals (PRESERVISION AREDS 2) CAPS Give one pill by mouth bid Patient not taking: Reported on 05/17/2021 11/09/19   Tower, Audrie Gallus, MD  nitroGLYCERIN (NITROSTAT) 0.4 MG SL tablet Place under the tongue. Patient not taking: Reported on 09/17/2020 06/19/19 07/17/20  [provider]  omega-3 acid ethyl esters (LOVAZA) 1 g capsule TAKE 1 CAPSULE (1 G TOTAL) BY MOUTH DAILY. 07/26/19   Tower, Audrie Gallus, MD  ondansetron (ZOFRAN) 4 MG tablet Take 4 mg by mouth every 4 (four) hours as needed for  nausea or vomiting. Patient not taking: Reported on 05/17/2021    [provider]   polyethylene glycol (MIRALAX / GLYCOLAX) 17 g packet Take 17 g by mouth daily. Patient not taking: Reported on 05/17/2021 11/11/19   Tower, Audrie Gallus, MD  prednisoLONE acetate (PRED FORTE) 1 % ophthalmic suspension Place 1 drop into the left eye every 12 (twelve) hours.    [provider]  PROAIR HFA 108 (90 Base) MCG/ACT inhaler INHALE 2 PUFFS INTO THE LUNGS EVERY 4 (FOUR) HOURS AS NEEDED FOR WHEEZING OR SHORTNESS OF BREATH. Patient not taking: Reported on 05/17/2021 03/05/16   Tower, Audrie Gallus, MD  Propylene Glycol (SYSTANE BALANCE) 0.6 % SOLN Apply 1 drop to eye at bedtime as needed.    [provider]  rosuvastatin (CRESTOR) 20 MG tablet TAKE 1 TABLET BY MOUTH EVERYDAY AT BEDTIME Patient not taking: Reported on 05/17/2021 11/04/19   Enedina Finner, MD  sennosides-docusate sodium (SENOKOT-S) 8.6-50 MG tablet Take 1 tablet by mouth daily. Patient not taking: Reported on 05/17/2021    [provider]  SitaGLIPtin-MetFORMIN HCl (JANUMET XR) 7184178318 MG TB24 Take 1 tablet by mouth daily.    [provider]  traMADol (ULTRAM) 50 MG tablet Take 50 mg by mouth 4 (four) times daily as needed. 05/24/20   [provider]  trolamine salicylate (ASPERCREME/ALOE) 10 % cream Apply 1 application topically daily. To shoulder areas 11/11/19   Judy Pimple, MD    Physical Exam: Vitals:   06/16/22 1445 06/16/22 1446 06/16/22 1453 06/16/22 1537  BP:  (!) 98/44  (!) 115/92  Pulse:  72  78  Resp:   16 12  Temp:  97.9 F (36.6 C)    TempSrc:  Oral    SpO2:  93%  93%  Weight: 58 kg     Height: 4\' 10"  (1.473 m)      General: Not in acute distress HEENT:       Eyes: PERRL, EOMI, no scleral icterus.       ENT: No discharge from the ears and nose, no pharynx injection, no tonsillar enlargement.        Neck: No JVD, no bruit, no mass felt. Heme: No neck lymph node enlargement. Cardiac: S1/S2, RRR, 2/6 systolic murmurs, No gallops or rubs. Respiratory: No rales, wheezing,  rhonchi or rubs. GI: Soft, nondistended, nontender, no rebound pain, no organomegaly, BS present. GU: No hematuria Ext: has trace leg edema bilaterally. 1+DP/PT pulse bilaterally. Musculoskeletal: No joint deformities, No joint redness or warmth, no limitation of ROM in spin. Skin: No rashes.  Neuro: Alert, oriented X3, cranial nerves II-XII grossly intact, moves all extremities normally.  Psych: Patient is not psychotic, no suicidal or hemocidal ideation.  Labs on Admission: I have personally reviewed following labs and imaging studies  CBC: Recent Labs  Lab 06/16/22 1454  WBC 17.9*  HGB 13.2  HCT 40.0  MCV 93.5  PLT 140*   Basic Metabolic Panel: Recent Labs  Lab 06/16/22 1454  NA 130*  K 3.9  CL 96*  CO2 22  GLUCOSE 228*  BUN 43*  CREATININE 1.41*  CALCIUM 9.6   GFR: Estimated Creatinine Clearance: 20.4 mL/min (A) (by C-G formula based on SCr of 1.41 mg/dL (H)). Liver Function Tests: No results for input(s): "AST", "ALT", "ALKPHOS", "BILITOT", "PROT", "ALBUMIN" in the last 168 hours. No results for input(s): "LIPASE", "AMYLASE" in the last 168 hours. No results for input(s): "AMMONIA" in the last 168 hours. Coagulation Profile: Recent Labs  Lab 06/16/22 1625  INR 1.3*   Cardiac Enzymes: No results for input(s): "CKTOTAL", "CKMB", "CKMBINDEX", "TROPONINI" in the last 168 hours. BNP (last 3 results) No results for input(s): "PROBNP" in the last 8760 hours. HbA1C: No results for input(s): "HGBA1C" in the last 72 hours. CBG: No results for input(s): "GLUCAP" in the last 168 hours. Lipid Profile: No results for input(s): "CHOL", "HDL", "LDLCALC", "TRIG", "CHOLHDL", "LDLDIRECT" in the last 72 hours. Thyroid Function Tests: No results for input(s): "TSH", "T4TOTAL", "FREET4", "T3FREE", "THYROIDAB" in the last 72 hours. Anemia Panel: No results for input(s): "VITAMINB12", "FOLATE", "FERRITIN", "TIBC", "IRON", "RETICCTPCT" in the last 72 hours. Urine analysis:     Component Value Date/Time   COLORURINE YELLOW (A) 05/25/2020 1857   APPEARANCEUR CLEAR (A) 05/25/2020 1857   LABSPEC 1.011 05/25/2020 1857   PHURINE 6.0 05/25/2020 1857   GLUCOSEU NEGATIVE 05/25/2020 1857   HGBUR NEGATIVE 05/25/2020 1857   BILIRUBINUR NEGATIVE 05/25/2020 1857   BILIRUBINUR Negative 10/02/2015 1032   KETONESUR NEGATIVE 05/25/2020 1857   PROTEINUR NEGATIVE 05/25/2020 1857   UROBILINOGEN 0.2 10/02/2015 1032   UROBILINOGEN 0.2 09/30/2008 0236   NITRITE NEGATIVE 05/25/2020 1857   LEUKOCYTESUR NEGATIVE 05/25/2020 1857   Sepsis Labs: @LABRCNTIP (procalcitonin:4,lacticidven:4) )No results found for this or any previous visit (from the past 240 hour(s)).   Radiological Exams on Admission: DG Chest 2 View  Result Date: 06/16/2022 CLINICAL DATA:  Chest pain. EXAM: CHEST - 2 VIEW COMPARISON:  December 11, 2021. FINDINGS: Stable cardiomediastinal silhouette. Large hiatal hernia is again noted. No acute pulmonary disease is noted. Degenerative changes seen involving the right glenohumeral joint. IMPRESSION: No acute abnormality seen.  Large hiatal hernia. Electronically Signed   By: Lupita Raider M.D.   On: 06/16/2022 15:11      Assessment/Plan Principal Problem:   NSTEMI (non-ST elevated myocardial infarction) (HCC) Active Problems:   CAD (coronary artery disease)   Chronic diastolic CHF (congestive heart failure) (HCC)   Essential hypertension   History of CVA (cerebrovascular accident)   HLD (hyperlipidemia)   Asthma   Type 2 diabetes mellitus without complications (HCC)   Leukocytosis   Elevated lactic acid level   AKI (acute kidney injury) (HCC)   H/O gastroesophageal reflux (GERD)   Depression with anxiety   Assessment and Plan:   NSTEMI (non-ST elevated myocardial infarction) (HCC) and hx of CAD: s/p of stent. Trop  425 --> 398. Pt's chest pain is pleuritic, aggravated by deep breath.  Given history of DVT not taking anticoagulants, will need to rule out  PE. Consulted Dr. Kirke Corin of card.  - admit to tele bed as inpatient - IV heparin - Trend Trop - prn Nitroglycerin, fentnyl - ASA  - Risk factor stratification: will check FLP and A1C  - 2d echo - F/u V/Q scan  Chronic diastolic CHF (congestive heart failure) (HCC): 2D echo on 07/04/2019 showed EF> 55%.  BNP is significantly elevated 1711, but patient only has exertional shortness of breath.  No oxygen desaturation.  Does not seem to have CHF acute exacerbation.  Since patient has AKI and soft blood pressure, will not start IV diuretics. -Watch volume status closely -Hold diuretics  Essential hypertension: Blood pressures are soft -Hold lisinopril, clonidine, imdur -continue coreg -IV hydralazine as needed  History of CVA (cerebrovascular accident) -ASA  HLD (hyperlipidemia): pt could not tolerate statin -f/u FLP  Asthma: -Bronchodilators  Type 2 diabetes mellitus without complications (HCC): Recent A1c 7.0.  Patient is taking humulog, Janumet, trulicity, lantus 4 units  daily -Sliding scale insulin -glargine insulin 3 units daily  Leukocytosis: WBC 17.9, no fever, no source of infection identified so far patient reports burning with urination we need to rule out a UTI -Blood culture -Follow-up urinalysis  Elevated lactic acid level: Lactic acid 2.6, no source of infection identified, does not seem to have sepsis -Will not give IV fluids due to elevated BNP -Trend lactic acid level  AKI (acute kidney injury) (HCC) -Hold Lasix, lisinopril -IV fluid: 1 L normal saline was given in ED  H/O gastroesophageal reflux (GERD) -Pepcid  Depression with anxiety -Continue home medications    DVT ppx: on IV Heparin    Code Status: DNR ( pt has DNR form with her)  Family Communication:     Yes, patient's daughter and granddaughter at bed side.  Disposition Plan:  Anticipate discharge back to previous environment  Consults called: Dr. Kirke Corin of cardiology  Admission status  and Level of care: Telemetry Cardiac:    as inpt       Dispo: The patient is from: SNF              Anticipated d/c is to: SNF              Anticipated d/c date is: 2 days              Patient currently is not medically stable to d/c.    Severity of Illness:  The appropriate patient status for this patient is INPATIENT. Inpatient status is judged to be reasonable and necessary in order to provide the required intensity of service to ensure the patient's safety. The patient's presenting symptoms, physical exam findings, and initial radiographic and laboratory data in the context of their chronic comorbidities is felt to place them at high risk for further clinical deterioration. Furthermore, it is not anticipated that the patient will be medically stable for discharge from the hospital within 2 midnights of admission.   * I certify that at the point of admission it is my clinical judgment that the patient will require inpatient hospital care spanning beyond 2 midnights from the point of admission due to high intensity of service, high risk for further deterioration and high frequency of surveillance required.*       Date of Service 06/16/2022    Lorretta Harp [facility]   If 7PM-7AM, please contact night-coverage [email or web address] 06/16/2022, 6:33 PM

## 2022-06-16 NOTE — ED Triage Notes (Signed)
Pt here via [facility] from [facility] with cp. Pt has a hx of hiatal hernia, pain is upper abd pushing on her chest and radiating to her back.  90/45 94% RA 75

## 2022-06-16 NOTE — Consult Note (Signed)
Cardiology Consultation   Patient ID: KARISE WEICH MRN: [id number]; DOB: [birth year]  Admit date: 06/16/2022 Date of Consult: 06/16/2022  PCP:  Judy Pimple, MD   [facility] Providers Cardiologist:  Julien Nordmann, MD        Patient Profile:   Lydia King is a 89 y.o. female with a hx of coronary artery disease status post PCI in LAD and Cx May 2021, moderate aortic stenosis, HFpEF, hypertension, hyperlipidemia, type 2 diabetes, stroke, hiatal hernia, anemia, asthma, hiatal hernia, and history of DVT who is being seen 06/16/2022 for the evaluation of chest pain at the request of Dr.Siadecki .  History of Present Illness:   Lydia King is a previous myocardial infarction in May 2021 and was treated at [facility] with PCI/DES placement to the LAD and circumflex.  Echocardiogram revealed LVEF 55% with mild MR and moderate aortic stenosis.  Since her MI she does have chronic chest pain.  CT of the chest showed large hiatal hernia who she saw bariatrics at [facility] and was considered high risk due to comorbidities but the lap gastropexy and G-tube could be considered when she recovered from recent knee surgery.  She was then evaluated in the emergency room in October 2021 for atypical chest pain.  Workup was unremarkable.  There was no further testing that was ordered.  She was last seen in clinic 09/17/2020 by Dr. Mariah Milling at that time she had complaints of midsternal pain/pressure and shortness of breath with little exertion.  For large hiatal hernia and worsening symptoms she was recommended to talk with specialist at [facility].  Cardiac perspective she would be acceptable risk for procedure when she would be able to stop Plavix for surgery.  At that time the patient felt like she reached a point where no longer able just to tolerate oral pain medications alone.  She had 3 additional emergency department visits 04/15/2020 in early 2023 for various instances of falling.  12/11/2021 she was  evaluated in the emergency department again for chest pain.  She stated the pain went away after she took Tums and rest compliant with the rest of the night.  EKG revealed no ischemic changes she was hemodynamically stable and high-sensitivity troponins were flat x 2.  She was subsequently discharged from the emergency department.  She presented to the [facility] emergency department via EMS from [facility] with complaints of chest pain.  Patient stated that she had had chest pain for the last 3 days that was persistent" mainly on the left side of her chest.  She described it as a pressure and sharp and also was associated with nausea and belching.  She states that it feels similar to prior chest pain caused by hiatal hernia with radiation into her back.  She did complain of upper abdominal pain as well.  She continued to deny any shortness of breath dizziness or lightheadedness, peripheral edema, or recent falls.  She continues to have routine follow-ups by palliative care where her last follow-up was 05/20/2022.  Initial vital signs: Blood pressure 98/44, pulse 72, respirations of 16, temperature 97.9  Pertinent labs: Sodium of 130, chloride of 96, blood glucose of 228, BUN of 43, serum creatinine of 1.4, GFR 36, WBCs of 17.9, high-sensitivity troponin of 415  Imaging: Chest x-ray revealed no acute abnormality seen, large hiatal hernia  Cardiology was consulted with elevated high-sensitivity troponin 415 with concerns for NSTEMI.   Past Medical History:  Diagnosis Date   (HFpEF) heart failure  with preserved ejection fraction (HCC)    a. 06/2019 Echo ([facility]): EF >55%, mild conc LVH. Mild-mod AS, mild MR/TR.   Anemia    in past (from blood loss at hiatal hernia)   Asthma    CAD (coronary artery disease)    a. 05/2019 MI/PCI: ost LCX (rota + cba-->3.0x16 Synergy DES), LAD 90 (3.0x16 Synergy DES).   Charcot-Marie-Tooth disease    walks with cane (fairly controlled)   Diverticulosis    DVT (deep  venous thrombosis) (HCC) 2003   after sx   GERD (gastroesophageal reflux disease)    Grief reaction    on fluoxetine   Headache(784.0)    migraine   Hiatal hernia    Hyperlipidemia    Hypertension    OA (ocular albinism) (HCC)    Old myocardial infarction    Osteopenia    PUD (peptic ulcer disease)    gastric, past   Shingles    Stroke (HCC) 8/10   posterior reversible encephalopathy syndrome   Urine incontinence     Past Surgical History:  Procedure Laterality Date   carotid dopplers  8/10   COLONOSCOPY  2002   HERNIA REPAIR  2002   bleeding   HIATAL HERNIA REPAIR  2000   needed blood transfusion   Stroke  8/10   hosp stroke/ posterior (vs posterior reversible encephalopathy syndrome) - d/c on Plavix   TEE normal  8/10   no emoblic source   TOTAL KNEE ARTHROPLASTY  03,06   Bilateral   VESICOVAGINAL FISTULA CLOSURE W/ TAH  1964   with bleeding and cervical dysplasia     Home Medications:  Prior to Admission medications   Medication Sig Start Date End Date Taking? Authorizing Provider  acetaminophen (TYLENOL) 500 MG tablet Take 2 tablets (1,000 mg total) by mouth in the morning, at noon, and at bedtime. Patient not taking: Reported on 05/17/2021 11/04/19   Enedina Finner, MD  aspirin EC 81 MG EC tablet Take 1 tablet (81 mg total) by mouth daily. Swallow whole. 08/05/19   Osvaldo Shipper, MD  calcium carbonate (TUMS - DOSED IN MG ELEMENTAL CALCIUM) 500 MG chewable tablet Chew 12 tablets (2,400 mg of elemental calcium total) by mouth 3 (three) times daily with meals. 05/17/21   Tower, Audrie Gallus, MD  Capsaicin-Menthol (SALONPAS PAIN REL GEL-PTCH HOT) 0.025-1.25 % PTCH Apply topically. Patient not taking: Reported on 05/17/2021    [provider]  carvedilol (COREG) 6.25 MG tablet Take 1 tablet (6.25 mg total) by mouth 2 (two) times daily with a meal. 11/04/19   Enedina Finner, MD  Cholecalciferol (VITAMIN D) 2000 UNITS CAPS Take 2 capsules by mouth daily. Patient not taking:  Reported on 05/17/2021    [provider]  cloNIDine (CATAPRES) 0.1 MG tablet Take 0.1 mg by mouth 3 (three) times daily as needed. Patient not taking: Reported on 05/17/2021    [provider]  clopidogrel (PLAVIX) 75 MG tablet TAKE 1 TABLET BY MOUTH EVERY DAY Patient not taking: Reported on 05/17/2021 07/26/19   Tower, Audrie Gallus, MD  cyanocobalamin 1000 MCG tablet Take 1,000 mcg by mouth daily. Patient not taking: Reported on 05/17/2021    [provider]  dexlansoprazole (DEXILANT) 60 MG capsule Take 1 capsule (60 mg total) by mouth daily. Patient not taking: Reported on 05/17/2021 07/20/20   Wyline Mood, MD  diclofenac Sodium (VOLTAREN) 1 % GEL Apply 2 g topically 3 (three) times daily. 03/28/20   [provider]  EPINEPHrine 0.3 mg/0.3 mL  IJ SOAJ injection Inject 0.3 mg into the muscle as needed for anaphylaxis. Patient not taking: Reported on 05/17/2021    [provider]  esomeprazole (NEXIUM) 40 MG capsule Take 40 mg by mouth daily at 12 noon. Patient not taking: Reported on 05/17/2021 07/17/20   [provider]  famotidine (PEPCID) 20 MG tablet Take 20 mg by mouth 2 (two) times daily.    [provider]  FLUoxetine (PROZAC) 20 MG capsule Take 20 mg by mouth daily. 11/20/19   [provider]  furosemide (LASIX) 40 MG tablet Take 1 tablet (40 mg total) by mouth daily. 11/09/19   Tower, Audrie Gallus, MD  isosorbide mononitrate (IMDUR) 30 MG 24 hr tablet Take 1 tablet (30 mg total) by mouth 2 (two) times daily. 11/04/19 09/17/20  Enedina Finner, MD  lidocaine (LMX) 4 % cream Apply 1 application topically in the morning and at bedtime. To low back and shoulder areas Patient not taking: Reported on 05/17/2021 11/11/19   Tower, Idamae Schuller A, MD  lisinopril (ZESTRIL) 20 MG tablet Take 40 mg by mouth daily.    [provider]  Lysine 500 MG CAPS Take 2 caps by mouth three times daily 05/17/21   Tower, Audrie Gallus, MD  magic mouthwash SOLN Take 15  mLs by mouth 3 (three) times daily as needed for mouth pain. 1:1:1% Lido/Diphen/Maalox    [provider]  melatonin 3 MG TABS tablet Take 3 mg by mouth at bedtime.    [provider]  Menthol-Methyl Salicylate (SALONPAS PAIN RELIEF PATCH EX) Apply 1 patch topically daily. Patient not taking: Reported on 05/17/2021    [provider]  metFORMIN (GLUCOPHAGE XR) 500 MG 24 hr tablet Take 1 tablet (500 mg total) by mouth daily with breakfast. 11/04/19 11/03/20  Enedina Finner, MD  Multiple Vitamins-Minerals (PRESERVISION AREDS 2) CAPS Give one pill by mouth bid Patient not taking: Reported on 05/17/2021 11/09/19   Tower, Audrie Gallus, MD  nitroGLYCERIN (NITROSTAT) 0.4 MG SL tablet Place under the tongue. Patient not taking: Reported on 09/17/2020 06/19/19 07/17/20  [provider]  omega-3 acid ethyl esters (LOVAZA) 1 g capsule TAKE 1 CAPSULE (1 G TOTAL) BY MOUTH DAILY. 07/26/19   Tower, Audrie Gallus, MD  ondansetron (ZOFRAN) 4 MG tablet Take 4 mg by mouth every 4 (four) hours as needed for nausea or vomiting. Patient not taking: Reported on 05/17/2021    [provider]  polyethylene glycol (MIRALAX / GLYCOLAX) 17 g packet Take 17 g by mouth daily. Patient not taking: Reported on 05/17/2021 11/11/19   Tower, Audrie Gallus, MD  prednisoLONE acetate (PRED FORTE) 1 % ophthalmic suspension Place 1 drop into the left eye every 12 (twelve) hours.    [provider]  PROAIR HFA 108 (90 Base) MCG/ACT inhaler INHALE 2 PUFFS INTO THE LUNGS EVERY 4 (FOUR) HOURS AS NEEDED FOR WHEEZING OR SHORTNESS OF BREATH. Patient not taking: Reported on 05/17/2021 03/05/16   Tower, Audrie Gallus, MD  Propylene Glycol (SYSTANE BALANCE) 0.6 % SOLN Apply 1 drop to eye at bedtime as needed.    [provider]  rosuvastatin (CRESTOR) 20 MG tablet TAKE 1 TABLET BY MOUTH EVERYDAY AT BEDTIME Patient not taking: Reported on 05/17/2021 11/04/19   Enedina Finner, MD  sennosides-docusate sodium (SENOKOT-S) 8.6-50 MG  tablet Take 1 tablet by mouth daily. Patient not taking: Reported on 05/17/2021    [provider]  SitaGLIPtin-MetFORMIN HCl (JANUMET XR) [phone] MG TB24 Take 1 tablet by mouth daily.  [provider]  traMADol (ULTRAM) 50 MG tablet Take 50 mg by mouth 4 (four) times daily as needed. 05/24/20   [provider]  trolamine salicylate (ASPERCREME/ALOE) 10 % cream Apply 1 application topically daily. To shoulder areas 11/11/19   Tower, Audrie Gallus, MD    Inpatient Medications: Scheduled Meds:  heparin  3,000 Units Intravenous Once   Continuous Infusions:  heparin     sodium chloride     PRN Meds:   Allergies:    Allergies  Allergen Reactions   Morphine And Codeine Anaphylaxis   Other Shortness Of Breath and Other (See Comments)   Shrimp [Shellfish Allergy] Anaphylaxis    Anaphylaxis-swelling of throat/sob     Amlodipine    Amlodipine Besylate     REACTION: edema   Atorvastatin     REACTION: pain- could not walk   Ciprofloxacin Other (See Comments)    REACTION: unkown Trouble breathing   Influenza Vaccines Other (See Comments)    Arm swelling, redness, chills, fever   Iodinated Contrast Media    Naproxen Other (See Comments)    Throat swelling   Nitrofurantoin    Omeprazole     REACTION: not effective   Pantoprazole Sodium     REACTION: does not work well   Pantoprazole Sodium    Penicillin G Hives    ITCHY RASH   Penicillins     REACTION: itchy rash   Simvastatin     REACTION: leg pain   Sulfa Antibiotics    Sulfonamide Derivatives     REACTION: itchy rash   Codeine Rash    REACTION: itchy rash ITCHY RASH, also breathing trouble   Prednisone Other (See Comments)    REACTION: RASH.// SHORTNESS OF BREATH Prior possible flushing reaction in face, but tolerated it in 2020 and 2021 up to 20mg     Social History:   Social History   Socioeconomic History   Marital status: Widowed    Spouse name: Not on file   Number of children: Not on  file   Years of education: Not on file   Highest education level: Not on file  Occupational History   Not on file  Tobacco Use   Smoking status: Never   Smokeless tobacco: Never  Vaping Use   Vaping Use: Never used  Substance and Sexual Activity   Alcohol use: No   Drug use: No   Sexual activity: Not Currently  Other Topics Concern   Not on file  Social History Narrative   G30P4   Son died 2 years ago cerebral anerysm   Social Determinants of Health   Financial Resource Strain: Not on file  Food Insecurity: Not on file  Transportation Needs: Not on file  Physical Activity: Not on file  Stress: Not on file  Social Connections: Not on file  Intimate Partner Violence: Not on file    Family History:    Family History  Problem Relation Age of Onset   Cerebral aneurysm Son    Cerebral aneurysm Father    Hyperlipidemia Father    Alcohol abuse Father    Stroke Mother    Cervical cancer Sister    Hyperlipidemia Brother    Alcohol abuse Brother    Stroke Brother    Diabetes Brother    Diabetes Sister    Sarcoidosis Sister    Other Sister        OP/ Broken hip   Other Other        whole family has  charcot marie tooth   Ovarian cancer Sister      ROS:  Please see the history of present illness.  Review of Systems  Constitutional:  Positive for malaise/fatigue.  Respiratory:  Positive for shortness of breath.   Cardiovascular:  Positive for chest pain.  Gastrointestinal:  Positive for abdominal pain and nausea.  Musculoskeletal:  Positive for falls.  Neurological:  Positive for weakness.    All other ROS reviewed and negative.     Physical Exam/Data:   Vitals:   06/16/22 1445 06/16/22 1446 06/16/22 1453 06/16/22 1537  BP:  (!) 98/44  (!) 115/92  Pulse:  72  78  Resp:   16 12  Temp:  97.9 F (36.6 C)    TempSrc:  Oral    SpO2:  93%  93%  Weight: 58 kg     Height: 4\' 10"  (1.473 m)      No intake or output data in the 24 hours ending 06/16/22 1643     06/16/2022    2:45 PM 05/20/2022   11:00 AM 01/28/2022    1:00 PM  Last 3 Weights  Weight (lbs) 127 lb 13.9 oz 127 lb 14.4 oz 131 lb 14.4 oz  Weight (kg) 58 kg 58.015 kg 59.829 kg     Body mass index is 26.72 kg/m.  General:  Well nourished, well developed, in no acute distress HEENT: normal Neck: unable to assess JVD due to body positioning  Vascular: No carotid bruits; Distal pulses 2+ bilaterally Cardiac:  normal S1, S2; RRR; II-III systolic murmur  Lungs:  clear upper with diminished bases to auscultation bilaterally, no wheezing, rhonchi or rales, respirations are unlabored on room air currently Abd: soft, nontender, no hepatomegaly  Ext: trace pretibial edema Musculoskeletal:  No deformities, BUE and BLE strength normal and equal Skin: warm and dry  Neuro:  CNs 2-12 intact, no focal abnormalities noted Psych:  Normal affect   EKG:  The EKG was personally reviewed and demonstrates: Sinus rhythm with a rate of 74, nonspecific T wave abnormalities Telemetry:  Telemetry was personally reviewed and demonstrates:  sinus rates in the 70's  Relevant CV Studies:  TTE Outside hospital 07/04/19 INTERPRETATION  PRESERVED LV FUNCTION AND NORMAL WALL MOTION  MILD CONCENTRIC LVH  MILD MODERATE AORTIC STENOSIS  NORMAL RV FUNCTION  MILD MR AND TR  TRIVIAL EFFUSION NOTED WITHOUT HEMODYNAMIC COMPROMISE  RIGHT PLEURAL EFFUSION NOTED  COMPARED TO PREVIOUS EXAM THERE ARE NO SIGNIFICANT CHANGES   LHC Outside facility 06/17/19 1. Successful PCI of the ostial  circumflex with a 1.5 rotational atherectomy followed by a 3.0 x 15 mm cutting balloon and deployment of a 3mm  x 16mm Synergy DES with an excellent angiographic result. IVUS prior to stent deployment showed exact luminal   diameter and no involvement of the left main.  2. Successful PCI of the LAD with initial IVUS showing a 90% stenosis with minimal calcification. A 3mm x 16mm Synergy DES was deployed at 14 ATM's   Recommendations:   1.  Remove IABP in the cath lab and suture sheath to the skin.  2. Remove sheaths by manual compression once the ACT is less than 200 sec.  3. DAPT for the next 3  to 12 months if possible.  4. Return to floor in stable condition.    Cardiac MRI: Outside facility 06/13/19  1. The left ventricle is normal in cavity size. There is mild LV hypertrophy. Global systolic function is  hyperdynamic. The calculated LV ejection  fraction is 71%. The basal inferior wall is hypokinetic.   2. The right ventricle is normal in cavity size, wall thickness, and systolic function.   3. The left atrium is moderately to severely enlarged. The right atrium is moderately enlarged.   4. The aortic valve is tricuspid in morphology with moderate calcific-degenerative changes. There is  morphological evidence of significant aortic valve stenosis. However, we were unable to continue with the  aortic valve flow assessment portion of the exam to assess the severity of stenosis. The patient felt very  uncomfortable inside the scanner and requested the exam to be interrupted immediately. There is no aortic  regurgitation.   5. The mitral valve leaflets are mildly thickened. There is mild mitral regurgitation. There is moderate  tricuspid regurgitation. There is mild pulmonic regurgitation.   6. Delayed enhancement imaging demonstrates no evidence of myocardial infarction, scarring or infiltrative  disease.   7. Adenosine stress perfusion imaging is abnormal. There are perfusion defects involving the basal  inferoseptum and mid to apical inferior walls, consistent with inducible ischemia in the distribution of a  posterior descending coronary artery branch of the RCA or the territory of the LCX coronary artery  depending upon coronary artery dominance.   8.  No intracardiac thrombus are seen.   9. The pericardium is normal in thickness. There is a small pericardial effusion.   10.  Small bilateral pleural effusions  are seen.   Laboratory Data:  High Sensitivity Troponin:   Recent Labs  Lab 06/16/22 1454  TROPONINIHS 415*     Chemistry Recent Labs  Lab 06/16/22 1454  NA 130*  K 3.9  CL 96*  CO2 22  GLUCOSE 228*  BUN 43*  CREATININE 1.41*  CALCIUM 9.6  GFRNONAA 36*  ANIONGAP 12    No results for input(s): "PROT", "ALBUMIN", "AST", "ALT", "ALKPHOS", "BILITOT" in the last 168 hours. Lipids No results for input(s): "CHOL", "TRIG", "HDL", "LABVLDL", "LDLCALC", "CHOLHDL" in the last 168 hours.  Hematology Recent Labs  Lab 06/16/22 1454  WBC 17.9*  RBC 4.28  HGB 13.2  HCT 40.0  MCV 93.5  MCH 30.8  MCHC 33.0  RDW 13.0  PLT 140*   Thyroid No results for input(s): "TSH", "FREET4" in the last 168 hours.  BNP Recent Labs  Lab 06/16/22 1434  BNP 1,311.2*    DDimer No results for input(s): "DDIMER" in the last 168 hours.   Radiology/Studies:  DG Chest 2 View  Result Date: 06/16/2022 CLINICAL DATA:  Chest pain. EXAM: CHEST - 2 VIEW COMPARISON:  December 11, 2021. FINDINGS: Stable cardiomediastinal silhouette. Large hiatal hernia is again noted. No acute pulmonary disease is noted. Degenerative changes seen involving the right glenohumeral joint. IMPRESSION: No acute abnormality seen.  Large hiatal hernia. Electronically Signed   By: Lupita Raider M.D.   On: 06/16/2022 15:11     Assessment and Plan:   Chest pain/NSTEMI vs demand ischemia -Patient presented with chest pain for the last 3 days, stating it felt like previous issues associated with hiatal hernia, after she had eaten salad a steak -High-sensitivity troponins cycled with the first being positive at 415 -continue to trend troponins, looking for decline with chest pain for several days -Recommend starting heparin infusion -Patient has had chronic chest pain since 2021 -Continue with telemetry monitoring -continue on asa 81 mg daily -echocardiogram ordered to assess AS and wall motion abnormalities   Coronary artery  disease -PCI/DES to the LAD circumflex May 2021 -Continue on aspirin  and Imdur -Continue telemetry monitoring -EKG for pain or changes  Large hiatal hernia -Patient with chronic pain associated with various meals -Evaluated for surgery but considered too high risk and not a surgical candidate -Previously had stopped taking PPI stating ineffectiveness  Moderate aortic stenosis -Noted on previous echocardiogram -Obtain an echocardiogram ordered and pending further recommendations to follow -Recommend continuing on statin therapy  Chronic HFpEF -Endorses dyspnea on exertion but denies SOB or edema -BNP 1311.2 -Continue on carvedilol 6.25 mg twice daily -Continuation of diuretic therapy is limited due to soft blood pressure and AS -Last echocardiogram completed in 2021 revealed an LVEF greater than 55% with mild LVH, normal LV contraction, mild MR, moderate aortic stenosis, mild to moderate TR -Echocardiogram ordered and pending further recommendations to follow -Daily weights, I's and O's, low-sodium diet  Hypertension -Blood pressure 98/44 -PTA medications on hold -Vital signs per unit protocol  Hyperlipidemia -Recommend continuing statin -LDL 85  10/21 -Lipid panel in a.m.  Type 2 diabetes -Management per IM   Risk Assessment/Risk Scores:     TIMI Risk Score for Unstable Angina or Non-ST Elevation MI:   The patient's TIMI risk score is 6, which indicates a 41% risk of all cause mortality, new or recurrent myocardial infarction or need for urgent revascularization in the next 14 days.  New York Heart Association (NYHA) Functional Class NYHA Class I        For questions or updates, please contact [facility] Please consult [email or web address] for contact info under    Signed, Analia Zuk, NP  06/16/2022 4:43 PM

## 2022-06-17 ENCOUNTER — Other Ambulatory Visit: Payer: Self-pay

## 2022-06-17 ENCOUNTER — Encounter: Payer: Self-pay | Admitting: Internal Medicine

## 2022-06-17 ENCOUNTER — Inpatient Hospital Stay (HOSPITAL_COMMUNITY)
Admit: 2022-06-17 | Discharge: 2022-06-17 | Disposition: A | Discharge status: Home / Self Care | Payer: Medicare Other | Attending: Cardiology | Admitting: Cardiology

## 2022-06-17 ENCOUNTER — Inpatient Hospital Stay: Payer: Medicare Other

## 2022-06-17 DIAGNOSIS — R7989 Other specified abnormal findings of blood chemistry: Secondary | ICD-10-CM | POA: Diagnosis not present

## 2022-06-17 DIAGNOSIS — I350 Nonrheumatic aortic (valve) stenosis: Secondary | ICD-10-CM

## 2022-06-17 DIAGNOSIS — N179 Acute kidney failure, unspecified: Secondary | ICD-10-CM | POA: Diagnosis not present

## 2022-06-17 DIAGNOSIS — R079 Chest pain, unspecified: Secondary | ICD-10-CM | POA: Diagnosis not present

## 2022-06-17 DIAGNOSIS — J9601 Acute respiratory failure with hypoxia: Secondary | ICD-10-CM

## 2022-06-17 DIAGNOSIS — I959 Hypotension, unspecified: Secondary | ICD-10-CM | POA: Insufficient documentation

## 2022-06-17 DIAGNOSIS — E861 Hypovolemia: Secondary | ICD-10-CM

## 2022-06-17 DIAGNOSIS — I5032 Chronic diastolic (congestive) heart failure: Secondary | ICD-10-CM | POA: Diagnosis not present

## 2022-06-17 DIAGNOSIS — I214 Non-ST elevation (NSTEMI) myocardial infarction: Secondary | ICD-10-CM | POA: Diagnosis not present

## 2022-06-17 DIAGNOSIS — E871 Hypo-osmolality and hyponatremia: Secondary | ICD-10-CM | POA: Insufficient documentation

## 2022-06-17 LAB — BLOOD CULTURE ID PANEL (REFLEXED) - BCID2

## 2022-06-17 LAB — HEPARIN LEVEL (UNFRACTIONATED)
Heparin Unfractionated: 0.16 IU/mL — ABNORMAL LOW (ref 0.30–0.70)
Heparin Unfractionated: <0.1 IU/mL — ABNORMAL LOW (ref 0.30–0.70)
Heparin Unfractionated: 0.2 IU/mL — ABNORMAL LOW (ref 0.30–0.70)

## 2022-06-17 LAB — CBC
HCT: 37.2 % (ref 36.0–46.0)
Hemoglobin: 12.6 g/dL (ref 12.0–15.0)
MCH: 31.9 pg (ref 26.0–34.0)
MCHC: 33.9 g/dL (ref 30.0–36.0)
MCV: 94.2 fL (ref 80.0–100.0)
Platelets: 175 10*3/uL (ref 150–400)
RBC: 3.95 MIL/uL (ref 3.87–5.11)
RDW: 13.2 % (ref 11.5–15.5)
WBC: 15.8 10*3/uL — ABNORMAL HIGH (ref 4.0–10.5)
nRBC: 0 % (ref 0.0–0.2)

## 2022-06-17 LAB — ECHOCARDIOGRAM COMPLETE
AR max vel: 1.08 cm2
AV Area VTI: 1.33 cm2
AV Area mean vel: 1.04 cm2
AV Mean grad: 12 mmHg
AV Peak grad: 21.1 mmHg
Ao pk vel: 2.3 m/s
Area-P 1/2: 3.83 cm2
Height: 58 in
MV VTI: 1.59 cm2
S' Lateral: 2.1 cm
Weight: 2045.87 oz

## 2022-06-17 LAB — LIPID PANEL
Cholesterol: 222 mg/dL — ABNORMAL HIGH (ref 0–200)
HDL: 71 mg/dL (ref >40)
LDL Cholesterol: 117 mg/dL — ABNORMAL HIGH (ref 0–99)
Total CHOL/HDL Ratio: 3.1 RATIO
Triglycerides: 170 mg/dL — ABNORMAL HIGH (ref <150)
VLDL: 34 mg/dL (ref 0–40)

## 2022-06-17 LAB — BASIC METABOLIC PANEL
Anion gap: 10 (ref 5–15)
BUN: 47 mg/dL — ABNORMAL HIGH (ref 8–23)
CO2: 22 mmol/L (ref 22–32)
Calcium: 9.5 mg/dL (ref 8.9–10.3)
Chloride: 98 mmol/L (ref 98–111)
Creatinine, Ser: 1.29 mg/dL — ABNORMAL HIGH (ref 0.44–1.00)
GFR, Estimated: 40 mL/min — ABNORMAL LOW (ref >60)
Glucose, Bld: 229 mg/dL — ABNORMAL HIGH (ref 70–99)
Potassium: 4.2 mmol/L (ref 3.5–5.1)
Sodium: 130 mmol/L — ABNORMAL LOW (ref 135–145)

## 2022-06-17 LAB — TROPONIN I (HIGH SENSITIVITY)
Troponin I (High Sensitivity): 277 ng/L (ref <18)
Troponin I (High Sensitivity): 278 ng/L (ref <18)

## 2022-06-17 LAB — HEMOGLOBIN A1C
Hgb A1c MFr Bld: 6.6 % — ABNORMAL HIGH (ref 4.8–5.6)
Mean Plasma Glucose: 142.72 mg/dL

## 2022-06-17 LAB — GLUCOSE, CAPILLARY
Glucose-Capillary: 329 mg/dL — ABNORMAL HIGH (ref 70–99)
Glucose-Capillary: 242 mg/dL — ABNORMAL HIGH (ref 70–99)

## 2022-06-17 LAB — CULTURE, BLOOD (ROUTINE X 2)

## 2022-06-17 LAB — CBG MONITORING, ED: Glucose-Capillary: 211 mg/dL — ABNORMAL HIGH (ref 70–99)

## 2022-06-17 LAB — PROCALCITONIN: Procalcitonin: 6.26 ng/mL

## 2022-06-17 LAB — LACTIC ACID, PLASMA: Lactic Acid, Venous: 1.3 mmol/L (ref 0.5–1.9)

## 2022-06-17 MED ORDER — SODIUM CHLORIDE 0.9 % IV BOLUS
500.0000 mL | Freq: Once | INTRAVENOUS | Status: AC
  Administered 2022-06-17: 500 mL via INTRAVENOUS

## 2022-06-17 MED ORDER — HEPARIN BOLUS VIA INFUSION
1600.0000 [IU] | Freq: Once | INTRAVENOUS | Status: AC
  Administered 2022-06-17: 1600 [IU] via INTRAVENOUS
  Filled 2022-06-17: qty 1600

## 2022-06-17 MED ORDER — TECHNETIUM TO 99M ALBUMIN AGGREGATED
4.0000 | Freq: Once | INTRAVENOUS | Status: AC
  Administered 2022-06-17: 3.99 via INTRAVENOUS

## 2022-06-17 MED ORDER — HEPARIN (PORCINE) 25000 UT/250ML-% IV SOLN
1200.0000 [IU]/h | INTRAVENOUS | Status: DC
  Administered 2022-06-17: 800 [IU]/h via INTRAVENOUS
  Administered 2022-06-17: 900 [IU]/h via INTRAVENOUS
  Filled 2022-06-17: qty 250

## 2022-06-17 MED ORDER — SODIUM CHLORIDE 0.9 % IV SOLN
2.0000 g | INTRAVENOUS | Status: DC
  Administered 2022-06-17: 2 g via INTRAVENOUS
  Filled 2022-06-17 (×2): qty 20

## 2022-06-17 MED ORDER — PROSIGHT PO TABS
2.0000 | ORAL_TABLET | Freq: Every day | ORAL | Status: DC
  Administered 2022-06-17 – 2022-06-24 (×8): 2 via ORAL
  Filled 2022-06-17 (×8): qty 2

## 2022-06-17 MED ORDER — ENSURE ENLIVE PO LIQD
237.0000 mL | Freq: Two times a day (BID) | ORAL | Status: DC
  Administered 2022-06-17 (×2): 237 mL via ORAL

## 2022-06-17 MED ORDER — HEPARIN BOLUS VIA INFUSION
1500.0000 [IU] | Freq: Once | INTRAVENOUS | Status: AC
  Administered 2022-06-17: 1500 [IU] via INTRAVENOUS
  Filled 2022-06-17: qty 1500

## 2022-06-17 MED ORDER — HEPARIN BOLUS VIA INFUSION
800.0000 [IU] | Freq: Once | INTRAVENOUS | Status: AC
  Administered 2022-06-17: 800 [IU] via INTRAVENOUS
  Filled 2022-06-17: qty 800

## 2022-06-17 MED ORDER — ORAL CARE MOUTH RINSE: 15.0000 mL | OROMUCOSAL | Status: DC | PRN

## 2022-06-17 MED ORDER — CALCIUM CARBONATE ANTACID 500 MG PO CHEW
400.0000 mg | CHEWABLE_TABLET | Freq: Three times a day (TID) | ORAL | Status: DC
  Administered 2022-06-17 – 2022-06-24 (×16): 400 mg via ORAL
  Filled 2022-06-17 (×16): qty 2

## 2022-06-17 MED ORDER — IPRATROPIUM-ALBUTEROL 0.5-2.5 (3) MG/3ML IN SOLN
3.0000 mL | Freq: Four times a day (QID) | RESPIRATORY_TRACT | Status: DC
  Administered 2022-06-17 – 2022-06-23 (×23): 3 mL via RESPIRATORY_TRACT
  Filled 2022-06-17 (×15): qty 3
  Filled 2022-06-17: qty 6
  Filled 2022-06-17 (×5): qty 3

## 2022-06-17 MED ORDER — FUROSEMIDE 10 MG/ML IJ SOLN
20.0000 mg | Freq: Once | INTRAMUSCULAR | Status: AC
  Administered 2022-06-17: 20 mg via INTRAVENOUS
  Filled 2022-06-17: qty 4

## 2022-06-17 MED ORDER — ALUM & MAG HYDROXIDE-SIMETH 200-200-20 MG/5ML PO SUSP: 30.0000 mL | Freq: Four times a day (QID) | ORAL | Status: DC | PRN

## 2022-06-17 NOTE — ED Notes (Signed)
Nurse called to room by nuclear medicine staff for possible heparin infiltration. IV site with edema and ecchymosis. I called pharmacy who recommended removal of IV and application of ice. Will notify MD. Pt also wheezing at this time. Will give ordered PRN duoneb.

## 2022-06-17 NOTE — Progress Notes (Signed)
*  PRELIMINARY RESULTS* Echocardiogram 2D Echocardiogram has been performed.  Cristela Blue 06/17/2022, 11:02 AM

## 2022-06-17 NOTE — Hospital Course (Addendum)
Lydia King is a 89 y.o. female with medical history significant of DVT no longer on anticoagulation, CAD, stent placement, stroke, asthma, anemia, hypertension, hyperlipidemia, hiatal hernia, GIB, depression with anxiety, gastric ulcer, diastolic CHF, OSA on CPAP, aortic stenosis, who presents with chest pain.  Peak troponin 415.  Cardiology consult is obtained, patient also placed on heparin drip.

## 2022-06-17 NOTE — ED Notes (Signed)
Ice pack applied to R-FA

## 2022-06-17 NOTE — Progress Notes (Signed)
RT entered room with CPAP for hs. Pt and family stated that she was having difficulty getting the air into her lungs. Pt appeared to be struggling to breathe with oxygen saturation in low-mid 90's. Pt placed on HHFNC for pt comfort. 40L 32% with decrease in effort to breathe.

## 2022-06-17 NOTE — Progress Notes (Signed)
Rounding Note    Patient Name: Lydia King Date of Encounter: 06/17/2022  [facility] Cardiologist: Julien Nordmann, MD   Subjective   Patient seen on a.m. rounds.  Family remains at the bedside.  She continues to endorse shortness of breath with chest discomfort with trying to take a deep breath or lying back on the stretcher.  She is now requiring oxygen via nasal cannula to maintain oxygen saturations.  She was recently given IV furosemide by the emergency department provider.  Family has concerns this morning of decreased urine output throughout the night.  She has been continued on heparin infusion.  Inpatient Medications    Scheduled Meds:  aspirin EC  81 mg Oral Daily   baclofen  5 mg Oral Daily   calcium carbonate  12 tablet Oral TID WC   carvedilol  6.25 mg Oral BID WC   famotidine  20 mg Oral QHS   feeding supplement  237 mL Oral BID BM   fluconazole  100 mg Oral Q48H   FLUoxetine  30 mg Oral Daily   insulin aspart  0-5 Units Subcutaneous QHS   insulin aspart  0-9 Units Subcutaneous TID WC   insulin glargine-yfgn  3 Units Subcutaneous QHS   lidocaine  1 patch Transdermal Q24H   melatonin  2.5 mg Oral QHS   multivitamin  2 tablet Oral Daily   polyvinyl alcohol  1 drop Both Eyes 5 X Daily   Continuous Infusions:  heparin 800 Units/hr (06/17/22 0733)   PRN Meds: acetaminophen, albuterol, dextromethorphan-guaiFENesin, fentaNYL (SUBLIMAZE) injection, hydrALAZINE, nitroGLYCERIN, ondansetron (ZOFRAN) IV, mouth rinse, oxyCODONE-acetaminophen, polyethylene glycol   Vital Signs    Vitals:   06/17/22 0930 06/17/22 0958 06/17/22 1100 06/17/22 1200  BP: (!) 86/46 (!) 90/45 (!) 105/56 (!) 132/39  Pulse: 81  81 81  Resp: (!) 21  (!) 24 (!) 21  Temp:      TempSrc:      SpO2: 94%  95% 97%  Weight:      Height:        Intake/Output Summary (Last 24 hours) at 06/17/2022 1355 Last data filed at 06/17/2022 1101 Gross per 24 hour  Intake 500 ml  Output --   Net 500 ml      06/16/2022    2:45 PM 05/20/2022   11:00 AM 01/28/2022    1:00 PM  Last 3 Weights  Weight (lbs) 127 lb 13.9 oz 127 lb 14.4 oz 131 lb 14.4 oz  Weight (kg) 58 kg 58.015 kg 59.829 kg      Telemetry    Sinus rhythm sinus tach- Personally Reviewed  ECG    No new tracings- Personally Reviewed  Physical Exam   GEN: Frail and ill-appearing Neck: No JVD Cardiac: RRR, III/VI systolic murmur, without rubs, or gallops.  Respiratory: Coarse to auscultation bilaterally. Increased work of breathing on 2L O2 via [employer] GI: Soft, nontender, non-distended  MS: No edema; No deformity. Neuro:  Nonfocal  Psych: Normal affect   Labs    High Sensitivity Troponin:   Recent Labs  Lab 06/16/22 1454 06/16/22 1703 06/16/22 2122 06/17/22 0104 06/17/22 0316  TROPONINIHS 415* 398* 307* 277* 278*     Chemistry Recent Labs  Lab 06/16/22 1454 06/17/22 0316  NA 130* 130*  K 3.9 4.2  CL 96* 98  CO2 22 22  GLUCOSE 228* 229*  BUN 43* 47*  CREATININE 1.41* 1.29*  CALCIUM 9.6 9.5  GFRNONAA 36* 40*  ANIONGAP 12 10  Lipids  Recent Labs  Lab 06/17/22 0316  CHOL 222*  TRIG 170*  HDL 71  LDLCALC 117*  CHOLHDL 3.1    Hematology Recent Labs  Lab 06/16/22 1454 06/17/22 0104  WBC 17.9* 15.8*  RBC 4.28 3.95  HGB 13.2 12.6  HCT 40.0 37.2  MCV 93.5 94.2  MCH 30.8 31.9  MCHC 33.0 33.9  RDW 13.0 13.2  PLT 140* 175   Thyroid No results for input(s): "TSH", "FREET4" in the last 168 hours.  BNP Recent Labs  Lab 06/16/22 1434  BNP 1,311.2*    DDimer No results for input(s): "DDIMER" in the last 168 hours.   Radiology    ECHOCARDIOGRAM COMPLETE  Result Date: 06/17/2022    ECHOCARDIOGRAM REPORT   Patient Name:   Lydia King Date of Exam: 06/17/2022 Medical Rec #:  [id number]        Height:       58.0 in Accession #:    [id number]       Weight:       127.9 lb Date of Birth:  Jun 18, 1930        BSA:          1.506 m Patient Age:    89 years         BP:           90/45  mmHg Patient Gender: F                HR:           81 bpm. Exam Location:  [facility] Procedure: 2D Echo, Cardiac Doppler and Color Doppler Indications:     Chest pain R07.9                  Aortic stenosis I35.0  History:         Patient has no prior history of Echocardiogram examinations.                  CAD; Risk Factors:Hypertension and Dyslipidemia.  Sonographer:     Cristela Blue Referring Phys:  [id number] Marysa Wessner Diagnosing Phys: Cristal Deer End MD  Sonographer Comments: Technically challenging study due to limited acoustic windows and suboptimal apical window. IMPRESSIONS  1. Left ventricular ejection fraction, by estimation, is 65 to 70%. The left ventricle has normal function. Left ventricular endocardial border not optimally defined to evaluate regional wall motion. Left ventricular diastolic parameters are consistent with Grade II diastolic dysfunction (pseudonormalization). Elevated left atrial pressure.  2. Right ventricular systolic function is normal. The right ventricular size is normal. There is severely elevated pulmonary artery systolic pressure. The estimated right ventricular systolic pressure is 63.1 mmHg.  3. Left atrial size was moderately dilated.  4. The mitral valve is degenerative. Trivial mitral valve regurgitation. No evidence of mitral stenosis. Moderate mitral annular calcification.  5. Tricuspid valve regurgitation is moderate to severe.  6. The aortic valve has an indeterminant number of cusps. There is severe calcifcation of the aortic valve. There is severe thickening of the aortic valve. Aortic valve regurgitation is not visualized. There is at least mild to moderate aortic stenosis,  though degree of stenosis appears more severe by visual estimation. Poor Doppler envelopes likely underestimate the aortic valve gradient.  7. Pulmonic valve regurgitation not well assessed.  8. The inferior vena cava is normal in size with <50% respiratory variability, suggesting right atrial  pressure of 8 mmHg. FINDINGS  Left Ventricle: Left ventricular ejection fraction, by estimation, is 65 to  70%. The left ventricle has normal function. Left ventricular endocardial border not optimally defined to evaluate regional wall motion. The left ventricular internal cavity size was normal in size. There is no left ventricular hypertrophy. Left ventricular diastolic parameters are consistent with Grade II diastolic dysfunction (pseudonormalization). Elevated left atrial pressure. Right Ventricle: The right ventricular size is normal. No increase in right ventricular wall thickness. Right ventricular systolic function is normal. There is severely elevated pulmonary artery systolic pressure. The tricuspid regurgitant velocity is 3.71 m/s, and with an assumed right atrial pressure of 8 mmHg, the estimated right ventricular systolic pressure is 63.1 mmHg. Left Atrium: Left atrial size was moderately dilated. Right Atrium: Right atrial size was normal in size. Pericardium: The pericardium was not well visualized. Mitral Valve: The mitral valve is degenerative in appearance. Moderate mitral annular calcification. Trivial mitral valve regurgitation. No evidence of mitral valve stenosis. MV peak gradient, 8.9 mmHg. The mean mitral valve gradient is 3.0 mmHg. Tricuspid Valve: The tricuspid valve is normal in structure. Tricuspid valve regurgitation is moderate to severe. Aortic Valve: The aortic valve has an indeterminant number of cusps. There is severe calcifcation of the aortic valve. There is severe thickening of the aortic valve. Aortic valve regurgitation is not visualized. There is at least mild to moderate aortic  stenosis, though degree of stenosis appears more severe by visual estimation. Poor Doppler envelopes likely underestimate the aortic valve gradient. Aortic valve mean gradient measures 12.0 mmHg. Aortic valve peak gradient measures 21.1 mmHg. Aortic valve area, by VTI measures 1.33 cm. Pulmonic Valve:  The pulmonic valve was not well visualized. Pulmonic valve regurgitation not well assessed. Aorta: The aortic root was not well visualized. Pulmonary Artery: The pulmonary artery is not well seen. Venous: The inferior vena cava is normal in size with less than 50% respiratory variability, suggesting right atrial pressure of 8 mmHg. IAS/Shunts: The interatrial septum was not well visualized.  LEFT VENTRICLE PLAX 2D LVIDd:         2.90 cm   Diastology LVIDs:         2.10 cm   LV e' medial:    6.85 cm/s LV PW:         0.90 cm   LV E/e' medial:  18.4 LV IVS:        0.90 cm   LV e' lateral:   8.05 cm/s LVOT diam:     2.00 cm   LV E/e' lateral: 15.7 LV SV:         53 LV SV Index:   35 LVOT Area:     3.14 cm  RIGHT VENTRICLE RV Basal diam:  3.70 cm RV Mid diam:    3.40 cm LEFT ATRIUM             Index        RIGHT ATRIUM           Index LA diam:        4.30 cm 2.86 cm/m   RA Area:     15.10 cm LA Vol (A2C):   73.0 ml 48.48 ml/m  RA Volume:   36.20 ml  24.04 ml/m LA Vol (A4C):   69.2 ml 45.96 ml/m LA Biplane Vol: 71.5 ml 47.48 ml/m  AORTIC VALVE AV Area (Vmax):    1.08 cm AV Area (Vmean):   1.04 cm AV Area (VTI):     1.33 cm AV Vmax:           229.60 cm/s AV Vmean:  157.200 cm/s AV VTI:            0.400 m AV Peak Grad:      21.1 mmHg AV Mean Grad:      12.0 mmHg LVOT Vmax:         79.00 cm/s LVOT Vmean:        51.800 cm/s LVOT VTI:          0.170 m LVOT/AV VTI ratio: 0.42 MITRAL VALVE                TRICUSPID VALVE MV Area (PHT): 3.83 cm     TR Peak grad:   55.1 mmHg MV Area VTI:   1.59 cm     TR Vmax:        371.00 cm/s MV Peak grad:  8.9 mmHg MV Mean grad:  3.0 mmHg     SHUNTS MV Vmax:       1.49 m/s     Systemic VTI:  0.17 m MV Vmean:      86.2 cm/s    Systemic Diam: 2.00 cm MV Decel Time: 198 msec MV E velocity: 126.00 cm/s MV A velocity: 96.40 cm/s MV E/A ratio:  1.31 Yvonne Kendall MD Electronically signed by Yvonne Kendall MD Signature Date/Time: 06/17/2022/12:56:27 PM    Final    DG Chest Port  1 View  Result Date: 06/16/2022 CLINICAL DATA:  Upper abdomen pain EXAM: PORTABLE CHEST 1 VIEW COMPARISON:  06/16/2022, 12/11/2021 FINDINGS: Redemonstrated large hiatal hernia. Cardiac silhouette is probably enlarged. No acute airspace disease or effusion. No pneumothorax. IMPRESSION: No active disease. Large hiatal hernia. Electronically Signed   By: Jasmine Pang M.D.   On: 06/16/2022 22:35   DG Chest 2 View  Result Date: 06/16/2022 CLINICAL DATA:  Chest pain. EXAM: CHEST - 2 VIEW COMPARISON:  December 11, 2021. FINDINGS: Stable cardiomediastinal silhouette. Large hiatal hernia is again noted. No acute pulmonary disease is noted. Degenerative changes seen involving the right glenohumeral joint. IMPRESSION: No acute abnormality seen.  Large hiatal hernia. Electronically Signed   By: Lupita Raider M.D.   On: 06/16/2022 15:11    Cardiac Studies  TTE 06/17/22 1. Left ventricular ejection fraction, by estimation, is 65 to 70%. The  left ventricle has normal function. Left ventricular endocardial border  not optimally defined to evaluate regional wall motion. Left ventricular  diastolic parameters are consistent  with Grade II diastolic dysfunction (pseudonormalization). Elevated left  atrial pressure.   2. Right ventricular systolic function is normal. The right ventricular  size is normal. There is severely elevated pulmonary artery systolic  pressure. The estimated right ventricular systolic pressure is 63.1 mmHg.   3. Left atrial size was moderately dilated.   4. The mitral valve is degenerative. Trivial mitral valve regurgitation.  No evidence of mitral stenosis. Moderate mitral annular calcification.   5. Tricuspid valve regurgitation is moderate to severe.   6. The aortic valve has an indeterminant number of cusps. There is severe  calcifcation of the aortic valve. There is severe thickening of the aortic  valve. Aortic valve regurgitation is not visualized. There is at least  mild to  moderate aortic stenosis,   though degree of stenosis appears more severe by visual estimation. Poor  Doppler envelopes likely underestimate the aortic valve gradient.   7. Pulmonic valve regurgitation not well assessed.   8. The inferior vena cava is normal in size with <50% respiratory  variability, suggesting right atrial pressure of 8 mmHg.   Patient  Profile     89 y.o. female with past medical history of coronary disease status post PCI to LAD and circumflex May 2021, moderate aortic stenosis, HFpEF, hypertension, hyperlipidemia, type 2 diabetes, stroke, hiatal hernia, anemia, asthma, and history of DVT, who is being seen and evaluated for chest pain and elevated high-sensitivity troponin.  Assessment & Plan    Chest pain/NSTEMI versus demand ischemia -Patient presented with chest pain for the past 3 days -Chest pain is more pleuritic -High-sensitivity troponin peaked at 415 and is trending down -Continue heparin infusion for 48 hours -Continue aspirin with history of coronary artery disease -Continues to complain of chest discomfort when trying to take a deep breath -Had to be placed on oxygen therapy overnight -Poor candidate for invasive cardiac evaluation considering age, frail status, and comorbidities  Coronary artery disease -PCI/DES to the LAD/circumflex May 2021 -Continued on aspirin and Imdur -Continue telemetry monitoring -EKG for pain or changes  Chronic HFpEF -Endorses dyspnea on exertion -BNP 1311.2 -Continued on carvedilol 6.25 mg twice daily -Given 1 dose of IV furosemide this morning -Daily weights, I's and O's, low-sodium diet  Moderate aortic stenosis -Echocardiogram revealed a mild to moderate aortic stenosis the degree of stenosis appears more severe by visual estimation -LVEF remains 65-70%  Hypertension -Blood pressure 120/59 -Continued on carvedilol -PTA lisinopril clonidine and Imdur remain on hold -Hypotension requiring IV fluid bolus -Vital  signs per unit protocol  Large hiatal hernia -Patient with chronic pain associated with hernia -Evaluated for surgery but considered too high risk and not a surgical candidate  Hyperlipidemia -LDL 117 -Statin intolerant to atorvastatin and simvastatin -Can consider starting a different statin or ezetimibe  Type 2 diabetes -Management per IM     For questions or updates, please contact [facility] Please consult [email or web address] for contact info under        Signed, Haim Hansson, NP  06/17/2022, 1:55 PM

## 2022-06-17 NOTE — ED Notes (Signed)
Pt still has had no urine output this shift. Provider informed.

## 2022-06-17 NOTE — ED Notes (Signed)
Called lab to verify they had been paged for blood draw, as night shift RN reported she called. They were not aware but said they would come obtain specimens immediately.

## 2022-06-17 NOTE — Progress Notes (Signed)
Progress Note   Patient: Lydia King:[id number] DOB: [birth year] DOA: 06/16/2022     1 DOS: the patient was seen and examined on 06/17/2022   Brief hospital course: Lydia King is a 89 y.o. female with medical history significant of DVT no longer on anticoagulation, CAD, stent placement, stroke, asthma, anemia, hypertension, hyperlipidemia, hiatal hernia, GIB, depression with anxiety, gastric ulcer, diastolic CHF, OSA on CPAP, aortic stenosis, who presents with chest pain.  Peak troponin 415.  Cardiology consult is obtained, patient also placed on heparin drip.   Principal Problem:   NSTEMI (non-ST elevated myocardial infarction) (HCC) Active Problems:   CAD (coronary artery disease)   Chronic diastolic CHF (congestive heart failure) (HCC)   Essential hypertension   History of CVA (cerebrovascular accident)   HLD (hyperlipidemia)   Asthma   Type 2 diabetes mellitus without complications (HCC)   Leukocytosis   Elevated lactic acid level   AKI (acute kidney injury) (HCC)   H/O gastroesophageal reflux (GERD)   Depression with anxiety   Large hiatal hernia   Moderate aortic stenosis   Hyponatremia   Assessment and Plan: NSTEMI (non-ST elevated myocardial infarction) (HCC) and hx of CAD:  Chronic diastolic CHF (congestive heart failure) (HCC):  Moderate aortic stenosis. Patient only has a minimal elevation of troponin, currently on heparin drip, pending cardiology consult. Pulmonary perfusion test is pending to rule out PE.   Continue current treatment.  Echocardiogram is pending. Patient has some elevation of BNP, but does not seem to have overt congestive heart failure exacerbation.  Hypotension.   Acute kidney injury. Hyponatremia. Patient had hypotension requiring IV fluid bolus.  She appeared to have some dehydration. Recheck a BMP tomorrow.  Patient does not have any GI symptoms at this time.  Renal function mildly worsened.   Essential hypertension:  -Hold  lisinopril, clonidine, imdur -continue coreg -IV hydralazine as needed   Dysphagia. Large hiatal hernia. Gastroesophageal reflux disease. Leukocytosis: Patient has significant dysphagia, choking while eating.  This could be complicated by large hiatal hernia.  Obtain speech therapy evaluation. Patient also had significant leukocytosis, urine UA abnormal, however patient does not have urinary symptoms.  Leukocytosis could be due to aspiration, obtain procalcitonin level.  Hold antibiotics for now.  History of CVA (cerebrovascular accident) -ASA   HLD (hyperlipidemia): pt could not tolerate statin -f/u FLP   Asthma: -Bronchodilators   Type 2 diabetes mellitus without complications (HCC):  Recent A1c 7.0.  Continue insulin glargine and sliding scale insulin.   Elevated lactic acid level:  Likely due to dehydration.   Depression with anxiety -Continue home medications      Subjective:  Patient has significant dysphagia while eating.  Cough with swallowing.  Denies any short of breath.  Physical Exam: Vitals:   06/17/22 0930 06/17/22 0958 06/17/22 1100 06/17/22 1200  BP: (!) 86/46 (!) 90/45 (!) 105/56 (!) 132/39  Pulse: 81  81 81  Resp: (!) 21  (!) 24 (!) 21  Temp:      TempSrc:      SpO2: 94%  95% 97%  Weight:      Height:       General exam: Appears calm and comfortable  Respiratory system: Clear to auscultation. Respiratory effort normal. Cardiovascular system: S1 & S2 heard, RRR. No JVD, murmurs, rubs, gallops or clicks. No pedal edema. Gastrointestinal system: Abdomen is nondistended, soft and nontender. No organomegaly or masses felt. Normal bowel sounds heard. Central nervous system: Alert and oriented x2. No focal neurological  deficits. Extremities: Symmetric 5 x 5 power. Skin: No rashes, lesions or ulcers Psychiatry: Judgement and insight appear normal. Mood & affect appropriate.    Data Reviewed:  Reviewed chest x-ray results, lab results.  Family  Communication: Daughter updated at bedside.  Disposition: Status is: Inpatient Remains inpatient appropriate because: Severity of disease, IV treatment.     Time spent: 50 minutes  Author: Marrion Coy, MD 06/17/2022 12:08 PM  For on call review [email or web address].

## 2022-06-17 NOTE — ED Notes (Signed)
Lab at bedside

## 2022-06-17 NOTE — Consult Note (Signed)
ANTICOAGULATION CONSULT NOTE  Pharmacy Consult for IV Heparin Indication: chest pain/ACS  Patient Measurements: Height: 4\' 10"  (147.3 cm) Weight: 58 kg (127 lb 13.9 oz) IBW/kg (Calculated) : 40.9 Heparin Dosing Weight: 53.2 kg  Labs: Recent Labs    06/16/22 1454 06/16/22 1625 06/16/22 1703 06/16/22 2122 06/17/22 0104 06/17/22 0316 06/17/22 1224 06/17/22 1848  HGB 13.2  --   --   --  12.6  --   --   --   HCT 40.0  --   --   --  37.2  --   --   --   PLT 140*  --   --   --  175  --   --   --   APTT  --  34  --   --   --   --   --   --   LABPROT  --  16.6*  --   --   --   --   --   --   INR  --  1.3*  --   --   --   --   --   --   HEPARINUNFRC  --   --   --   --   --  0.16* <0.10* 0.20*  CREATININE 1.41*  --   --   --   --  1.29*  --   --   TROPONINIHS 415*  --    < > 307* 277* 278*  --   --    < > = values in this interval not displayed.    Estimated Creatinine Clearance: 22.3 mL/min (A) (by C-G formula based on SCr of 1.29 mg/dL (H)).  Medical History: Past Medical History:  Diagnosis Date   (HFpEF) heart failure with preserved ejection fraction (HCC)    a. 06/2019 Echo ([facility]): EF >55%, mild conc LVH. Mild-mod AS, mild MR/TR.   Anemia    in past (from blood loss at hiatal hernia)   Asthma    CAD (coronary artery disease)    a. 05/2019 MI/PCI: ost LCX (rota + cba-->3.0x16 Synergy DES), LAD 90 (3.0x16 Synergy DES).   Charcot-Marie-Tooth disease    walks with cane (fairly controlled)   Diverticulosis    DVT (deep venous thrombosis) (HCC) 2003   after sx   GERD (gastroesophageal reflux disease)    Grief reaction    on fluoxetine   Headache(784.0)    migraine   Hiatal hernia    Hyperlipidemia    Hypertension    OA (ocular albinism) (HCC)    Old myocardial infarction    Osteopenia    PUD (peptic ulcer disease)    gastric, past   Shingles    Stroke (HCC) 8/10   posterior reversible encephalopathy syndrome   Urine incontinence     Medications:  No  anticoagulation prior to admission per my chart review. Medication reconciliation is pending  Assessment: 89 y/o F with medical history as above presenting to the ED 5/20 with chest pain. Troponin elevated, concern for ACS. Pharmacy consulted to initiate and manage heparin infusion for ACS.  Baseline CBC overall within normal limits. Baseline aPTT 34s and INR 1.3  0521 0316 HL 0.16, subthera; 600 un/hr 0521 1224 HL <0.10, subthera; 800 un/hr (line infiltrated around time of draw, infusion moved to other line)   Goal of Therapy:  Heparin level 0.3-0.7 units/ml Monitor platelets by anticoagulation protocol: Yes   Plan:  --0521@1848 : HL 0.20, subtherapeutic --Will bolus heparin 800 units and maintain drip  at current rate of 900 units/hr --Will recheck HL with AM labs --Daily CBC per protocol while on IV heparin  Bettey Costa 06/17/2022,7:20 PM

## 2022-06-17 NOTE — Consult Note (Signed)
ANTICOAGULATION CONSULT NOTE  Pharmacy Consult for IV Heparin Indication: chest pain/ACS  Patient Measurements: Height: 4\' 10"  (147.3 cm) Weight: 58 kg (127 lb 13.9 oz) IBW/kg (Calculated) : 40.9 Heparin Dosing Weight: 53.2 kg  Labs: Recent Labs    06/16/22 1454 06/16/22 1625 06/16/22 1703 06/16/22 2122 06/17/22 0104 06/17/22 0316  HGB 13.2  --   --   --  12.6  --   HCT 40.0  --   --   --  37.2  --   PLT 140*  --   --   --  175  --   APTT  --  34  --   --   --   --   LABPROT  --  16.6*  --   --   --   --   INR  --  1.3*  --   --   --   --   HEPARINUNFRC  --   --   --   --   --  0.16*  CREATININE 1.41*  --   --   --   --  1.29*  TROPONINIHS 415*  --  398* 307* 277*  --     Estimated Creatinine Clearance: 22.3 mL/min (A) (by C-G formula based on SCr of 1.29 mg/dL (H)).  Medical History: Past Medical History:  Diagnosis Date   (HFpEF) heart failure with preserved ejection fraction (HCC)    a. 06/2019 Echo ([facility]): EF >55%, mild conc LVH. Mild-mod AS, mild MR/TR.   Anemia    in past (from blood loss at hiatal hernia)   Asthma    CAD (coronary artery disease)    a. 05/2019 MI/PCI: ost LCX (rota + cba-->3.0x16 Synergy DES), LAD 90 (3.0x16 Synergy DES).   Charcot-Marie-Tooth disease    walks with cane (fairly controlled)   Diverticulosis    DVT (deep venous thrombosis) (HCC) 2003   after sx   GERD (gastroesophageal reflux disease)    Grief reaction    on fluoxetine   Headache(784.0)    migraine   Hiatal hernia    Hyperlipidemia    Hypertension    OA (ocular albinism) (HCC)    Old myocardial infarction    Osteopenia    PUD (peptic ulcer disease)    gastric, past   Shingles    Stroke (HCC) 8/10   posterior reversible encephalopathy syndrome   Urine incontinence     Medications:  No anticoagulation prior to admission per my chart review. Medication reconciliation is pending  Assessment: 89 y/o F with medical history as above presenting to the ED 5/20 with  chest pain. Troponin elevated, concern for ACS. Pharmacy consulted to initiate and manage heparin infusion for ACS.  Baseline CBC overall within normal limits. Baseline aPTT and PT-INR are pending.   Goal of Therapy:  Heparin level 0.3-0.7 units/ml Monitor platelets by anticoagulation protocol: Yes   Plan:  5/21: HL @ 0316 = 0.16, SUBtherapeutic  --- Will order heparin 1600 units IV X 1 bolus and increase drip rate to 800 units/hr.  --- Will recheck HL 8 hrs after rate change  ----Daily CBC per protocol while on IV heparin  Lydia King D 06/17/2022,3:46 AM

## 2022-06-17 NOTE — Progress Notes (Signed)
       CROSS COVER NOTE  NAME: Lydia King MRN: [id number] DOB : 01-27-1931 ATTENDING PHYSICIAN: Lorretta Harp, MD   #Chronic diastolic CHF  BNP is significantly elevated at 1711, overnight patient required 2L supplemental oxygen after desaturation to 87%.  BP now 142/61.  No UOP this shift, bladder scan showing 77 mL - 20 mg IV lasix   DG Chest Port 1 View  Result Date: 06/16/2022 CLINICAL DATA:  Upper abdomen pain EXAM: PORTABLE CHEST 1 VIEW COMPARISON:  06/16/2022, 12/11/2021 FINDINGS: Redemonstrated large hiatal hernia. Cardiac silhouette is probably enlarged. No acute airspace disease or effusion. No pneumothorax. IMPRESSION: No active disease. Large hiatal hernia. Electronically Signed   By: Jasmine Pang M.D.   On: 06/16/2022 22:35   DG Chest 2 View  Result Date: 06/16/2022 CLINICAL DATA:  Chest pain. EXAM: CHEST - 2 VIEW COMPARISON:  December 11, 2021. FINDINGS: Stable cardiomediastinal silhouette. Large hiatal hernia is again noted. No acute pulmonary disease is noted. Degenerative changes seen involving the right glenohumeral joint. IMPRESSION: No acute abnormality seen.  Large hiatal hernia. Electronically Signed   By: Lupita Raider M.D.   On: 06/16/2022 15:11     To reach the provider On-Call:   7AM- 7PM see care teams to locate the attending and reach out to them via [email or web address] 7PM-7AM contact night-coverage If you still have difficulty reaching the appropriate provider, please page the [facility] (Director on Call) for [facility] on amion for assistance  This document was prepared using Conservation officer, historic buildings and may include unintentional dictation errors.  Bishop Limbo DNP, MBA, FNP-BC, PMHNP-BC Nurse Practitioner [facility] Pager [phone]

## 2022-06-17 NOTE — Consult Note (Addendum)
ANTICOAGULATION CONSULT NOTE  Pharmacy Consult for IV Heparin Indication: chest pain/ACS  Patient Measurements: Height: 4\' 10"  (147.3 cm) Weight: 58 kg (127 lb 13.9 oz) IBW/kg (Calculated) : 40.9 Heparin Dosing Weight: 53.2 kg  Labs: Recent Labs    06/16/22 1454 06/16/22 1625 06/16/22 1703 06/16/22 2122 06/17/22 0104 06/17/22 0316 06/17/22 1224  HGB 13.2  --   --   --  12.6  --   --   HCT 40.0  --   --   --  37.2  --   --   PLT 140*  --   --   --  175  --   --   APTT  --  34  --   --   --   --   --   LABPROT  --  16.6*  --   --   --   --   --   INR  --  1.3*  --   --   --   --   --   HEPARINUNFRC  --   --   --   --   --  0.16* <0.10*  CREATININE 1.41*  --   --   --   --  1.29*  --   TROPONINIHS 415*  --    < > 307* 277* 278*  --    < > = values in this interval not displayed.    Estimated Creatinine Clearance: 22.3 mL/min (A) (by C-G formula based on SCr of 1.29 mg/dL (H)).  Medical History: Past Medical History:  Diagnosis Date   (HFpEF) heart failure with preserved ejection fraction (HCC)    a. 06/2019 Echo ([facility]): EF >55%, mild conc LVH. Mild-mod AS, mild MR/TR.   Anemia    in past (from blood loss at hiatal hernia)   Asthma    CAD (coronary artery disease)    a. 05/2019 MI/PCI: ost LCX (rota + cba-->3.0x16 Synergy DES), LAD 90 (3.0x16 Synergy DES).   Charcot-Marie-Tooth disease    walks with cane (fairly controlled)   Diverticulosis    DVT (deep venous thrombosis) (HCC) 2003   after sx   GERD (gastroesophageal reflux disease)    Grief reaction    on fluoxetine   Headache(784.0)    migraine   Hiatal hernia    Hyperlipidemia    Hypertension    OA (ocular albinism) (HCC)    Old myocardial infarction    Osteopenia    PUD (peptic ulcer disease)    gastric, past   Shingles    Stroke (HCC) 8/10   posterior reversible encephalopathy syndrome   Urine incontinence     Medications:  No anticoagulation prior to admission per my chart review. Medication  reconciliation is pending  Assessment: 89 y/o F with medical history as above presenting to the ED 5/20 with chest pain. Troponin elevated, concern for ACS. Pharmacy consulted to initiate and manage heparin infusion for ACS.  Baseline CBC overall within normal limits. Baseline aPTT 34s and INR 1.3  0521 0316 HL 0.16, subthera; 600 un/hr 0521 1224 HL <0.10, subthera; 800 un/hr (line infiltrated around time of draw, infusion moved to other line)   Goal of Therapy:  Heparin level 0.3-0.7 units/ml Monitor platelets by anticoagulation protocol: Yes   Plan:  --Heparin level < 0.10, in review of chart, appears line running heparin infiltrated. Discussed with RN, heparin was switched to other line. No significant pause in infusion.  Feel unlikely heparin would have been "washed out" after short pause  to result in such a low level. Would also be surprised if level decreased in response to last drip titration --Will bolus heparin 1500 units and maintain drip at current rate of 800 units/hr --Will recheck HL earlier, next at 1800 --Daily CBC per protocol while on IV heparin  Tressie Ellis 06/17/2022,12:48 PM

## 2022-06-17 NOTE — ED Notes (Signed)
MD notified of heparin infiltration to R-FA

## 2022-06-17 NOTE — Progress Notes (Signed)
PHARMACY - PHYSICIAN COMMUNICATION CRITICAL VALUE ALERT - BLOOD CULTURE IDENTIFICATION (BCID)  Lydia King is an 89 y.o. female who presented to [facility] on 06/16/2022 with a chief complaint of chest pain  Assessment: Per lab, blood cultures with GPC in 1/3 bottles. BCID detected Staphylococcus epidermidis with methicillin resistance. Suspect this result reflects contamination.  Name of physician (or Provider) Contacted: Dr. Chipper Herb  Current antibiotics: None  Changes to prescribed antibiotics recommended:  Suspect this result reflects contamination. No need to treat blood culture. However, per MD there is concern for pneumonia and patient will be placed on antibiotics for this.  Results for orders placed or performed during the hospital encounter of 06/16/22  Blood Culture ID Panel (Reflexed) (Collected: 06/16/2022  8:18 PM)  Result Value Ref Range   Enterococcus faecalis NOT DETECTED NOT DETECTED   Enterococcus Faecium NOT DETECTED NOT DETECTED   Listeria monocytogenes NOT DETECTED NOT DETECTED   Staphylococcus species DETECTED (A) NOT DETECTED   Staphylococcus aureus (BCID) NOT DETECTED NOT DETECTED   Staphylococcus epidermidis DETECTED (A) NOT DETECTED   Staphylococcus lugdunensis NOT DETECTED NOT DETECTED   Streptococcus species NOT DETECTED NOT DETECTED   Streptococcus agalactiae NOT DETECTED NOT DETECTED   Streptococcus pneumoniae NOT DETECTED NOT DETECTED   Streptococcus pyogenes NOT DETECTED NOT DETECTED   A.calcoaceticus-baumannii NOT DETECTED NOT DETECTED   Bacteroides fragilis NOT DETECTED NOT DETECTED   Enterobacterales NOT DETECTED NOT DETECTED   Enterobacter cloacae complex NOT DETECTED NOT DETECTED   Escherichia coli NOT DETECTED NOT DETECTED   Klebsiella aerogenes NOT DETECTED NOT DETECTED   Klebsiella oxytoca NOT DETECTED NOT DETECTED   Klebsiella pneumoniae NOT DETECTED NOT DETECTED   Proteus species NOT DETECTED NOT DETECTED   Salmonella species NOT  DETECTED NOT DETECTED   Serratia marcescens NOT DETECTED NOT DETECTED   Haemophilus influenzae NOT DETECTED NOT DETECTED   Neisseria meningitidis NOT DETECTED NOT DETECTED   Pseudomonas aeruginosa NOT DETECTED NOT DETECTED   Stenotrophomonas maltophilia NOT DETECTED NOT DETECTED   Candida albicans NOT DETECTED NOT DETECTED   Candida auris NOT DETECTED NOT DETECTED   Candida glabrata NOT DETECTED NOT DETECTED   Candida krusei NOT DETECTED NOT DETECTED   Candida parapsilosis NOT DETECTED NOT DETECTED   Candida tropicalis NOT DETECTED NOT DETECTED   Cryptococcus neoformans/gattii NOT DETECTED NOT DETECTED   Methicillin resistance mecA/C DETECTED (A) NOT DETECTED    Tressie Ellis 06/17/2022  6:05 PM

## 2022-06-17 NOTE — ED Notes (Signed)
Family reports pt can't eat meal tray sent and requesting ensure. Dietary to send one after order is put in.

## 2022-06-18 ENCOUNTER — Inpatient Hospital Stay: Payer: Medicare Other

## 2022-06-18 DIAGNOSIS — I10 Essential (primary) hypertension: Secondary | ICD-10-CM | POA: Diagnosis not present

## 2022-06-18 DIAGNOSIS — I214 Non-ST elevation (NSTEMI) myocardial infarction: Secondary | ICD-10-CM | POA: Diagnosis not present

## 2022-06-18 DIAGNOSIS — I25118 Atherosclerotic heart disease of native coronary artery with other forms of angina pectoris: Secondary | ICD-10-CM | POA: Diagnosis not present

## 2022-06-18 DIAGNOSIS — I272 Pulmonary hypertension, unspecified: Secondary | ICD-10-CM | POA: Insufficient documentation

## 2022-06-18 DIAGNOSIS — I5032 Chronic diastolic (congestive) heart failure: Secondary | ICD-10-CM

## 2022-06-18 DIAGNOSIS — R7989 Other specified abnormal findings of blood chemistry: Secondary | ICD-10-CM | POA: Diagnosis not present

## 2022-06-18 DIAGNOSIS — N179 Acute kidney failure, unspecified: Secondary | ICD-10-CM | POA: Diagnosis not present

## 2022-06-18 DIAGNOSIS — E871 Hypo-osmolality and hyponatremia: Secondary | ICD-10-CM

## 2022-06-18 DIAGNOSIS — Z8673 Personal history of transient ischemic attack (TIA), and cerebral infarction without residual deficits: Secondary | ICD-10-CM

## 2022-06-18 DIAGNOSIS — J9601 Acute respiratory failure with hypoxia: Secondary | ICD-10-CM | POA: Diagnosis not present

## 2022-06-18 DIAGNOSIS — E119 Type 2 diabetes mellitus without complications: Secondary | ICD-10-CM

## 2022-06-18 LAB — CBC
HCT: 32.9 % — ABNORMAL LOW (ref 36.0–46.0)
Hemoglobin: 11.2 g/dL — ABNORMAL LOW (ref 12.0–15.0)
MCH: 31.5 pg (ref 26.0–34.0)
MCHC: 34 g/dL (ref 30.0–36.0)
MCV: 92.7 fL (ref 80.0–100.0)
Platelets: 136 10*3/uL — ABNORMAL LOW (ref 150–400)
RBC: 3.55 MIL/uL — ABNORMAL LOW (ref 3.87–5.11)
RDW: 13.2 % (ref 11.5–15.5)
WBC: 9.8 10*3/uL (ref 4.0–10.5)
nRBC: 0 % (ref 0.0–0.2)

## 2022-06-18 LAB — CULTURE, BLOOD (ROUTINE X 2)

## 2022-06-18 LAB — HEPARIN LEVEL (UNFRACTIONATED)
Heparin Unfractionated: 0.18 IU/mL — ABNORMAL LOW (ref 0.30–0.70)
Heparin Unfractionated: 0.27 IU/mL — ABNORMAL LOW (ref 0.30–0.70)

## 2022-06-18 LAB — MRSA NEXT GEN BY PCR, NASAL: MRSA by PCR Next Gen: NOT DETECTED

## 2022-06-18 LAB — BASIC METABOLIC PANEL
Anion gap: 8 (ref 5–15)
BUN: 50 mg/dL — ABNORMAL HIGH (ref 8–23)
CO2: 23 mmol/L (ref 22–32)
Calcium: 9.2 mg/dL (ref 8.9–10.3)
Chloride: 97 mmol/L — ABNORMAL LOW (ref 98–111)
Creatinine, Ser: 1.07 mg/dL — ABNORMAL HIGH (ref 0.44–1.00)
GFR, Estimated: 50 mL/min — ABNORMAL LOW (ref >60)
Glucose, Bld: 259 mg/dL — ABNORMAL HIGH (ref 70–99)
Potassium: 3.7 mmol/L (ref 3.5–5.1)
Sodium: 128 mmol/L — ABNORMAL LOW (ref 135–145)

## 2022-06-18 LAB — GLUCOSE, CAPILLARY
Glucose-Capillary: 217 mg/dL — ABNORMAL HIGH (ref 70–99)
Glucose-Capillary: 191 mg/dL — ABNORMAL HIGH (ref 70–99)
Glucose-Capillary: 179 mg/dL — ABNORMAL HIGH (ref 70–99)
Glucose-Capillary: 156 mg/dL — ABNORMAL HIGH (ref 70–99)

## 2022-06-18 LAB — MAGNESIUM: Magnesium: 2.5 mg/dL — ABNORMAL HIGH (ref 1.7–2.4)

## 2022-06-18 MED ORDER — FLUCONAZOLE 100 MG PO TABS
100.0000 mg | ORAL_TABLET | Freq: Every day | ORAL | Status: DC
  Administered 2022-06-18 – 2022-06-24 (×7): 100 mg via ORAL
  Filled 2022-06-18 (×7): qty 1

## 2022-06-18 MED ORDER — VANCOMYCIN HCL 1500 MG/300ML IV SOLN
1500.0000 mg | Freq: Once | INTRAVENOUS | Status: AC
  Administered 2022-06-18: 1500 mg via INTRAVENOUS
  Filled 2022-06-18: qty 300

## 2022-06-18 MED ORDER — HEPARIN (PORCINE) 25000 UT/250ML-% IV SOLN
1400.0000 [IU]/h | INTRAVENOUS | Status: DC
  Administered 2022-06-18: 1200 [IU]/h via INTRAVENOUS
  Administered 2022-06-19: 1300 [IU]/h via INTRAVENOUS
  Administered 2022-06-20: 1400 [IU]/h via INTRAVENOUS
  Filled 2022-06-18 (×3): qty 250

## 2022-06-18 MED ORDER — CARVEDILOL 3.125 MG PO TABS
3.1250 mg | ORAL_TABLET | Freq: Two times a day (BID) | ORAL | Status: DC
  Administered 2022-06-18 – 2022-06-24 (×12): 3.125 mg via ORAL
  Filled 2022-06-18 (×12): qty 1

## 2022-06-18 MED ORDER — SODIUM CHLORIDE 0.9 % IV SOLN
2.0000 g | INTRAVENOUS | Status: DC
  Administered 2022-06-18 – 2022-06-19 (×2): 2 g via INTRAVENOUS
  Filled 2022-06-18 (×2): qty 12.5

## 2022-06-18 MED ORDER — VANCOMYCIN HCL IN DEXTROSE 1-5 GM/200ML-% IV SOLN: 1000.0000 mg | INTRAVENOUS | Status: DC

## 2022-06-18 MED ORDER — MIDODRINE HCL 5 MG PO TABS
5.0000 mg | ORAL_TABLET | Freq: Once | ORAL | Status: AC
  Administered 2022-06-18: 5 mg via ORAL
  Filled 2022-06-18: qty 1

## 2022-06-18 MED ORDER — HEPARIN BOLUS VIA INFUSION
800.0000 [IU] | Freq: Once | INTRAVENOUS | Status: AC
  Administered 2022-06-18: 800 [IU] via INTRAVENOUS
  Filled 2022-06-18: qty 800

## 2022-06-18 MED ORDER — HEPARIN BOLUS VIA INFUSION
1600.0000 [IU] | Freq: Once | INTRAVENOUS | Status: AC
  Administered 2022-06-18: 1600 [IU] via INTRAVENOUS
  Filled 2022-06-18: qty 1600

## 2022-06-18 MED ORDER — DIPHENHYDRAMINE HCL 50 MG/ML IJ SOLN
12.5000 mg | Freq: Once | INTRAMUSCULAR | Status: AC
  Administered 2022-06-18: 12.5 mg via INTRAVENOUS
  Filled 2022-06-18: qty 1

## 2022-06-18 NOTE — Inpatient Diabetes Management (Signed)
Inpatient Diabetes Program Recommendations  AACE/ADA: New Consensus Statement on Inpatient Glycemic Control (2015)  Target Ranges:  Prepandial:   less than 140 mg/dL      Peak postprandial:   less than 180 mg/dL (1-2 hours)      Critically ill patients:  140 - 180 mg/dL    Latest Reference Range & Units 06/17/22 01:04  Hemoglobin A1C 4.8 - 5.6 % 6.6 (H)  (H): Data is abnormally high  Latest Reference Range & Units 06/17/22 08:41 06/17/22 17:27 06/17/22 21:19  Glucose-Capillary 70 - 99 mg/dL 161 (H)  3 units Novolog  329 (H)  7 units Novolog  242 (H)  2 units Novolog  3 units Semglee @2346     Latest Reference Range & Units 06/18/22 07:47 06/18/22 12:13  Glucose-Capillary 70 - 99 mg/dL 096 (H)  3 units Novolog  191 (H)  2 units Novolog   (H): Data is abnormally high   Admit from SNF with CP/ NSTEMI  History: DM, CVA, CHF  Home DM Meds: Janumet 100/ 1000 mg Daily        Humalog 2 units QPM        Lantus 4 units QHS        Trulicity 0.75 mg Qweek         Current Orders: Novolog Sensitive Correction Scale/ SSI (0-9 units) TID AC + HS     Semglee 3 units QHS    MD- Please consider:  1. Increase Semglee slightly to 4 units QHS (home dose)  2. Start Novolog Meal Coverage: Novolog 2 units TID with meals HOLD if pt NPO HOLD if pt eats <50% meals    --Will follow patient during hospitalization--  Ambrose Finland RN, MSN, CDCES Diabetes Coordinator Inpatient Glycemic Control Team Team Pager: [phone] (8a-5p)

## 2022-06-18 NOTE — Evaluation (Signed)
Clinical/Bedside Swallow Evaluation Patient Details  Name: Lydia King MRN: [id number] Date of Birth: Apr 12, 1930  Today's Date: 06/18/2022 Time: SLP Start Time (ACUTE ONLY): 1504 SLP Stop Time (ACUTE ONLY): 1525 SLP Time Calculation (min) (ACUTE ONLY): 21 min  Past Medical History:  Past Medical History:  Diagnosis Date   (HFpEF) heart failure with preserved ejection fraction (HCC)    a. 06/2019 Echo ([facility]): EF >55%, mild conc LVH. Mild-mod AS, mild MR/TR.   Anemia    in past (from blood loss at hiatal hernia)   Asthma    CAD (coronary artery disease)    a. 05/2019 MI/PCI: ost LCX (rota + cba-->3.0x16 Synergy DES), LAD 90 (3.0x16 Synergy DES).   Charcot-Marie-Tooth disease    walks with cane (fairly controlled)   Diverticulosis    DVT (deep venous thrombosis) (HCC) 2003   after sx   GERD (gastroesophageal reflux disease)    Grief reaction    on fluoxetine   Headache(784.0)    migraine   Hiatal hernia    Hyperlipidemia    Hypertension    OA (ocular albinism) (HCC)    Old myocardial infarction    Osteopenia    PUD (peptic ulcer disease)    gastric, past   Shingles    Stroke (HCC) 8/10   posterior reversible encephalopathy syndrome   Urine incontinence    Past Surgical History:  Past Surgical History:  Procedure Laterality Date   carotid dopplers  8/10   COLONOSCOPY  2002   HERNIA REPAIR  2002   bleeding   HIATAL HERNIA REPAIR  2000   needed blood transfusion   Stroke  8/10   hosp stroke/ posterior (vs posterior reversible encephalopathy syndrome) - d/c on Plavix   TEE normal  8/10   no emoblic source   TOTAL KNEE ARTHROPLASTY  03,06   Bilateral   VESICOVAGINAL FISTULA CLOSURE W/ TAH  1964   with bleeding and cervical dysplasia   HPI:  JAELIANA King is a 89 y.o. female who presented to [facility] ED from [facility] on 06/16/2022 with chest pain for the last 3 days, persistent course, mainly in the left side of her chest, described as both pressure and  sharp, and associated with nausea and belching as well as radiation to her back.  This feels similar to prior chest pain caused by her hiatal hernia.  Initial lab workup reveals elevated troponin to 415.  Labs are also significant for elevated WBC count.  EKG shows nonspecific T wave abnormalities but no ischemic findings.  Pt with history of DVT no longer on anticoagulation, CAD, stroke, asthma, anemia, hypertension, hyperlipidemia, and hiatal hernia. Pt evaluated by cardiology with concern noted for potential aspiration pneumonia given size of hiatal hernia.   Chest x-ray (06/16/2022) revealed No acute abnormality seen.  Large hiatal hernia. Pt with decreased respiratory saturation requiring 2L supplemental oxygen.  Chest CT (06/16/2022) revealed No active disease. Large hiatal hernia. Continued SOB with Pulmonary Perfusion on 06/17/2022 revealing  multiple perfusion defects all vein sizes in both lungs. Some of the defects show wedge shape. There are no matching infiltrates in the lung fields in the chest radiograph done on 06/16/2022. Findings suggest intermediate to high probability for PE.  Increased SOB requiring HFNC - (40L 32%) with Chest CT (06/18/2022) revealing interval development of airspace disease, with dense left upper lobe consolidation most evident. Findings are consistent with multifocal pneumonia, though underlying sequela of pulmonary infarct cannot be excluded given recent V/Q scan findings. Echo  also showing severe pulmonary hypertension.    Assessment / Plan / Recommendation  Clinical Impression  Pt's daugther and grandaughter are at bedside. [name] states that prior to hospitalization pt was free of any overt s/s of aspiration with PO consumption. "Since being here, she has started coughing" when eating and drinking. There further report "now she isn't hungry or thirsty." Her daughter also describes pt's hiatal hernia as being "9 inches wide, spans her chest, touches  heart on one side and lungs on the other." They also state that pt's speech has periods of being "garbled or clear."  Pt sleeping upon SLP entering room and despite maximal attempts at physical stiumlation and repositioning, pt was not able to sustain arousal for safe PO trials. Given pt's current imaging, respiratory decline, inability to sustain arousal recommend pt be strictly NPO pending further assessment for PO readiness by SLP. Educaotn provided to pt's daughter at bedside on recommendation and POC for ST to follow patient on next day. Secure chat sent to pt's attending (Dr Sharl Ma) and pt's nurse. Might consider consult to pulmonology if indicated.   Should pt arouse and pt request something to drink, nursing may provide minimal ice chips following oral care for pt quality and swallow preservation only until ST re-assessment on 06/18/2022.   SLP Visit Diagnosis: Dysphagia, unspecified (R13.10)    Aspiration Risk  Severe aspiration risk    Diet Recommendation NPO   Medication Administration: Via alternative means    Other  Recommendations   N/A   Recommendations for follow up therapy are one component of a multi-disciplinary discharge planning process, led by the attending physician.  Recommendations may be updated based on patient status, additional functional criteria and insurance authorization.  Follow up Recommendations Follow physician's recommendations for discharge plan and follow up therapies      Assistance Recommended at Discharge  N/A  Functional Status Assessment Patient has had a recent decline in their functional status and/or demonstrates limited ability to make significant improvements in function in a reasonable and predictable amount of time  Frequency and Duration min 2x/week  2 weeks       Prognosis Prognosis for improved oropharyngeal function: Guarded Barriers to Reach Goals: Severity of deficits (pre-existing comorbities)      Swallow Study   General  Date of Onset: 06/16/22 HPI: Lydia King is a 89 y.o. female who presented to [facility] ED from [facility] on 06/16/2022 with chest pain for the last 3 days, persistent course, mainly in the left side of her chest, described as both pressure and sharp, and associated with nausea and belching as well as radiation to her back.  This feels similar to prior chest pain caused by her hiatal hernia.  Initial lab workup reveals elevated troponin to 415.  Labs are also significant for elevated WBC count.  EKG shows nonspecific T wave abnormalities but no ischemic findings.  Pt with history of DVT no longer on anticoagulation, CAD, stroke, asthma, anemia, hypertension, hyperlipidemia, and hiatal hernia. Pt evaluated by cardiology with concern noted for potential aspiration pneumonia given size of hiatal hernia. Type of Study: Bedside Swallow Evaluation Previous Swallow Assessment: none in chart Diet Prior to this Study: Regular;Thin liquids (Level 0) Temperature Spikes Noted: No Respiratory Status:  (HFNC) History of Recent Intubation: No Behavior/Cognition: Lethargic/Drowsy (mumbling) Oral Cavity Assessment:  (unable to visualize) Oral Care Completed by SLP: No Patient Positioning: Upright in bed Baseline Vocal Quality: Low vocal intensity    Oral/Motor/Sensory Function Overall Oral  Motor/Sensory Function:  (limited d/t lack of arousal)   Ice Chips Ice chips: Not tested         Elzie Knisley B. Dreama Saa, M.S., CCC-SLP, Tree surgeon Certified Brain Injury Specialist [facility]  [facility] Office [phone] Ascom [phone] Fax [phone]

## 2022-06-18 NOTE — Progress Notes (Signed)
[facility]  PROGRESS NOTE  Lydia King:[id number] DOB: 14-Jun-1930 DOA: 06/16/2022 PCP: Judy Pimple, MD   Brief HPI:   89 y.o. female with medical history significant of DVT no longer on anticoagulation, CAD, stent placement, stroke, asthma, anemia, hypertension, hyperlipidemia, hiatal hernia, GIB, depression with anxiety, gastric ulcer, diastolic CHF, OSA on CPAP, aortic stenosis, who presented with chest pain.  Troponin was elevated with peak troponin 415   Cardiology consult is obtained, patient also placed on heparin drip. Patient's oxygen requirement continued to rise, currently on 40 L/min HFNC, 37% FiO2.  VQ scan showed intermediate to high probability for pulmonary embolism CTA chest could not be obtained due to the contrast allergy       Assessment/Plan:    Acute hypoxemic respiratory failure -Likely in setting of PE , acute on chronic diastolic CHF -Currently requiring oxygen via high flow nasal cannula -VQ scan showed multiple perfusion defects already in size in both lungs, intermediate to high probability for PE, evaluation limited without ventilation images. -Heparin GTT  -CT chest recommended however not obtained at this time due to history of contrast allergy -Chest x-ray obtained this morning shows multifocal pneumonia, patient is currently on ceftriaxone; will switch to cefepime and vancomycin -Will consult pulmonology, Dr. Welton Flakes consulted  NSTEMI -History of CAD, presented with troponin elevation -Currently on heparin drip -Echocardiogram shows severe pulmonary hypertension -Cardiology following  ?  Pulmonary embolism -VQ scan shows intermediate hypermobility for PE -Multiple perfusion defects in both lung fields -Evaluation limited without ventilation images -Unable to obtain CTA chest due to contrast allergy  Acute kidney injury -Resolved -Likely in setting of hypotension  Hypertension -Continue to hold lisinopril, clonidine,  Imdur -Continue Coreg -Continue as needed hydralazine  Dysphagia -Patient had choking episode while eating -Swallow evaluation obtained -Currently n.p.o.  History of CVA (cerebrovascular accident) -ASA   HLD (hyperlipidemia): pt could not tolerate statin -f/u FLP   Asthma: -Bronchodilators  Hyponatremia -Likely in setting of dehydration, poor p.o. intake -Follow serum sodium in a.m.   Type 2 diabetes mellitus without complications [facility]):  Recent A1c 7.0.   -Continue insulin glargine and sliding scale insulin.   Elevated lactic acid level:  Likely due to dehydration.   Depression with anxiety -Continue home medications  Medications     aspirin EC  81 mg Oral Daily   baclofen  5 mg Oral Daily   calcium carbonate  400 mg of elemental calcium Oral TID WC   carvedilol  3.125 mg Oral BID WC   famotidine  20 mg Oral QHS   fluconazole  100 mg Oral Daily   FLUoxetine  30 mg Oral Daily   insulin aspart  0-5 Units Subcutaneous QHS   insulin aspart  0-9 Units Subcutaneous TID WC   insulin glargine-yfgn  3 Units Subcutaneous QHS   ipratropium-albuterol  3 mL Nebulization Q6H   lidocaine  1 patch Transdermal Q24H   melatonin  2.5 mg Oral QHS   multivitamin  2 tablet Oral Daily   polyvinyl alcohol  1 drop Both Eyes 5 X Daily     Data Reviewed:   CBG:  Recent Labs  Lab 06/17/22 0841 06/17/22 1727 06/17/22 2119 06/18/22 0747 06/18/22 1213  GLUCAP 211* 329* 242* 217* 191*    SpO2: 93 % O2 Flow Rate (L/min): 40 L/min FiO2 (%): 37 %    Vitals:   06/18/22 0744 06/18/22 0809 06/18/22 1229 06/18/22 1400  BP: (!) 116/57  (!) 143/62   Pulse: 88  86 79 84  Resp: 18 18 18 20   Temp: 99.3 F (37.4 C)  (!) 97.2 F (36.2 C)   TempSrc: Oral  Oral   SpO2: 97% 96% 93% 93%  Weight:      Height:          Data Reviewed:  Basic Metabolic Panel: Recent Labs  Lab 06/16/22 1454 06/17/22 0316 06/18/22 0418  NA 130* 130* 128*  K 3.9 4.2 3.7  CL 96* 98 97*  CO2 22  22 23   GLUCOSE 228* 229* 259*  BUN 43* 47* 50*  CREATININE 1.41* 1.29* 1.07*  CALCIUM 9.6 9.5 9.2  MG  --   --  2.5*    CBC: Recent Labs  Lab 06/16/22 1454 06/17/22 0104 06/18/22 0418  WBC 17.9* 15.8* 9.8  HGB 13.2 12.6 11.2*  HCT 40.0 37.2 32.9*  MCV 93.5 94.2 92.7  PLT 140* 175 136*    LFT No results for input(s): "AST", "ALT", "ALKPHOS", "BILITOT", "PROT", "ALBUMIN" in the last 168 hours.   Antibiotics: Anti-infectives (From admission, onward)    Start     Dose/Rate Route Frequency Ordered Stop   06/18/22 1000  fluconazole (DIFLUCAN) tablet 100 mg        100 mg Oral Daily 06/18/22 0926     06/17/22 2200  fluconazole (DIFLUCAN) tablet 100 mg  Status:  Discontinued        100 mg Oral Every 48 hours 06/16/22 2056 06/18/22 0926   06/17/22 2000  cefTRIAXone (ROCEPHIN) 2 g in sodium chloride 0.9 % 100 mL IVPB        2 g 200 mL/hr over 30 Minutes Intravenous Every 24 hours 06/17/22 1819 06/22/22 1959        DVT prophylaxis: Heparin  Code Status: DNR  Family Communication: Discussed with patient's sister at bedside   CONSULTS cardiology   Subjective   Patient seen and examined, continues to require HFNC 40 L/min, 37% FiO2.  Procalcitonin elevated at 6.52.  Chest x-ray obtained this morning shows dense left upper lobe consolidation   Objective    Physical Examination:   General-appears in no acute distress Heart-S1-S2, regular, no murmur auscultated Lungs-decreased breath sounds bilaterally Abdomen-soft, nontender, no organomegaly Extremities-no edema in the lower extremities Neuro-alert, oriented x3, no focal deficit noted  Status is: Inpatient:             Meredeth Ide   [facility] If 7PM-7AM, please contact night-coverage at [email or web address], Office  [phone]   06/18/2022, 4:53 PM  LOS: 2 days

## 2022-06-18 NOTE — Consult Note (Signed)
ANTICOAGULATION CONSULT NOTE  Pharmacy Consult for IV Heparin Indication: chest pain/ACS  Patient Measurements: Height: 4\' 10"  (147.3 cm) Weight: 61.4 kg (135 lb 5.8 oz) IBW/kg (Calculated) : 40.9 Heparin Dosing Weight: 53.2 kg  Labs: Recent Labs    06/16/22 1454 06/16/22 1625 06/16/22 1703 06/16/22 2122 06/17/22 0104 06/17/22 0316 06/17/22 0316 06/17/22 1224 06/17/22 1848 06/18/22 0418  HGB 13.2  --   --   --  12.6  --   --   --   --  11.2*  HCT 40.0  --   --   --  37.2  --   --   --   --  32.9*  PLT 140*  --   --   --  175  --   --   --   --  136*  APTT  --  34  --   --   --   --   --   --   --   --   LABPROT  --  16.6*  --   --   --   --   --   --   --   --   INR  --  1.3*  --   --   --   --   --   --   --   --   HEPARINUNFRC  --   --   --   --   --  0.16*   < > <0.10* 0.20* 0.18*  CREATININE 1.41*  --   --   --   --  1.29*  --   --   --  1.07*  TROPONINIHS 415*  --    < > 307* 277* 278*  --   --   --   --    < > = values in this interval not displayed.    Estimated Creatinine Clearance: 27.6 mL/min (A) (by C-G formula based on SCr of 1.07 mg/dL (H)).  Medical History: Past Medical History:  Diagnosis Date   (HFpEF) heart failure with preserved ejection fraction (HCC)    a. 06/2019 Echo ([facility]): EF >55%, mild conc LVH. Mild-mod AS, mild MR/TR.   Anemia    in past (from blood loss at hiatal hernia)   Asthma    CAD (coronary artery disease)    a. 05/2019 MI/PCI: ost LCX (rota + cba-->3.0x16 Synergy DES), LAD 90 (3.0x16 Synergy DES).   Charcot-Marie-Tooth disease    walks with cane (fairly controlled)   Diverticulosis    DVT (deep venous thrombosis) (HCC) 2003   after sx   GERD (gastroesophageal reflux disease)    Grief reaction    on fluoxetine   Headache(784.0)    migraine   Hiatal hernia    Hyperlipidemia    Hypertension    OA (ocular albinism) (HCC)    Old myocardial infarction    Osteopenia    PUD (peptic ulcer disease)    gastric, past   Shingles     Stroke (HCC) 8/10   posterior reversible encephalopathy syndrome   Urine incontinence     Medications:  No anticoagulation prior to admission per my chart review. Medication reconciliation is pending  Assessment: 89 y/o F with medical history as above presenting to the ED 5/20 with chest pain. Troponin elevated, concern for ACS. Pharmacy consulted to initiate and manage heparin infusion for ACS.  Baseline CBC overall within normal limits. Baseline aPTT 34s and INR 1.3  0521 0316 HL 0.16, subthera; 600 un/hr 1610  1224 HL <0.10, subthera; 800 un/hr (line infiltrated around time of draw, infusion moved to other line)  0522 0418 HL 0.18, SUBtherapeutic  Goal of Therapy:  Heparin level 0.3-0.7 units/ml Monitor platelets by anticoagulation protocol: Yes   Plan:  05/22:  HL @ 0418 = 0.18, SUBtherapeutic  - Will order heparin 1600 units IV X 1 bolus and increase drip rate to 1100 units/hr - Will recheck HL 8 hrs after rate change --Daily CBC per protocol while on IV heparin  Macklyn Glandon D 06/18/2022,5:44 AM

## 2022-06-18 NOTE — Consult Note (Signed)
Pharmacy Antibiotic Note  Lydia King is a 89 y.o. female with PMH including HFpEF, hx DVT, Hx CVA, CAD, asthma, OSA, GERD admitted on 06/16/2022 with  acute respiratory failure in setting of suspected pulmonary embolism and pneumonia . Pharmacy has been consulted for vancomycin and cefepime dosing.  Patient with apparent AKI on admission which appears to be resolving (Scr 1.41 >> 1.07, baseline 0.8 - 0.9)  Plan:  Cefepime 2 g IV q24h  Vancomycin 1.5 g IV loading dose followed by maintenance regimen of vancomycin 1 g IV q48h --Calculated AUC: 481, Cmin: 11.1 --Daily Scr per protocol --Levels at steady state or as clinically indicated  Height: 4\' 10"  (147.3 cm) Weight: 61.4 kg (135 lb 5.8 oz) IBW/kg (Calculated) : 40.9  Temp (24hrs), Avg:98.2 F (36.8 C), Min:97.2 F (36.2 C), Max:99.3 F (37.4 C)  Recent Labs  Lab 06/16/22 1454 06/16/22 1641 06/16/22 1841 06/17/22 0104 06/17/22 0316 06/17/22 0732 06/18/22 0418  WBC 17.9*  --   --  15.8*  --   --  9.8  CREATININE 1.41*  --   --   --  1.29*  --  1.07*  LATICACIDVEN  --  2.6* 2.6*  --   --  1.3  --     Estimated Creatinine Clearance: 27.6 mL/min (A) (by C-G formula based on SCr of 1.07 mg/dL (H)).    Allergies  Allergen Reactions   Morphine And Codeine Anaphylaxis   Other Shortness Of Breath and Other (See Comments)   Shrimp [Shellfish Allergy] Anaphylaxis    Anaphylaxis-swelling of throat/sob     Amlodipine    Amlodipine Besylate     REACTION: edema   Atorvastatin     REACTION: pain- could not walk   Ciprofloxacin Other (See Comments)    REACTION: unkown Trouble breathing   Influenza Vaccines Other (See Comments)    Arm swelling, redness, chills, fever   Iodinated Contrast Media    Naproxen Other (See Comments)    Throat swelling   Nitrofurantoin    Omeprazole     REACTION: not effective   Pantoprazole Sodium     REACTION: does not work well   Pantoprazole Sodium    Penicillin G Hives    ITCHY RASH    Penicillins     REACTION: itchy rash   Simvastatin     REACTION: leg pain   Sulfa Antibiotics    Sulfonamide Derivatives     REACTION: itchy rash   Codeine Rash    REACTION: itchy rash ITCHY RASH, also breathing trouble   Prednisone Other (See Comments)    REACTION: RASH.// SHORTNESS OF BREATH Prior possible flushing reaction in face, but tolerated it in 2020 and 2021 up to 20mg     Antimicrobials this admission: Ceftriaxone 5/21 x 1 Cefepime 5/22 >>  Vancomycin 5/22 >>   Dose adjustments this admission: N/A  Microbiology results: 5/20 BCx: 1/2 sets Staphylococcus epidermidis 5/22 MRSA PCR: pending  Thank you for allowing pharmacy to be a part of this patient's care.  Tressie Ellis 06/18/2022 5:06 PM

## 2022-06-18 NOTE — Consult Note (Signed)
ANTICOAGULATION CONSULT NOTE  Pharmacy Consult for IV Heparin Indication: chest pain/ACS  Patient Measurements: Height: 4\' 10"  (147.3 cm) Weight: 61.4 kg (135 lb 5.8 oz) IBW/kg (Calculated) : 40.9 Heparin Dosing Weight: 53.2 kg  Labs: Recent Labs    06/16/22 1454 06/16/22 1625 06/16/22 1703 06/16/22 2122 06/17/22 0104 06/17/22 0316 06/17/22 1224 06/17/22 1848 06/18/22 0418 06/18/22 1417  HGB 13.2  --   --   --  12.6  --   --   --  11.2*  --   HCT 40.0  --   --   --  37.2  --   --   --  32.9*  --   PLT 140*  --   --   --  175  --   --   --  136*  --   APTT  --  34  --   --   --   --   --   --   --   --   LABPROT  --  16.6*  --   --   --   --   --   --   --   --   INR  --  1.3*  --   --   --   --   --   --   --   --   HEPARINUNFRC  --   --   --   --   --  0.16*   < > 0.20* 0.18* 0.27*  CREATININE 1.41*  --   --   --   --  1.29*  --   --  1.07*  --   TROPONINIHS 415*  --    < > 307* 277* 278*  --   --   --   --    < > = values in this interval not displayed.    Estimated Creatinine Clearance: 27.6 mL/min (A) (by C-G formula based on SCr of 1.07 mg/dL (H)).  Medical History: Past Medical History:  Diagnosis Date   (HFpEF) heart failure with preserved ejection fraction (HCC)    a. 06/2019 Echo ([facility]): EF >55%, mild conc LVH. Mild-mod AS, mild MR/TR.   Anemia    in past (from blood loss at hiatal hernia)   Asthma    CAD (coronary artery disease)    a. 05/2019 MI/PCI: ost LCX (rota + cba-->3.0x16 Synergy DES), LAD 90 (3.0x16 Synergy DES).   Charcot-Marie-Tooth disease    walks with cane (fairly controlled)   Diverticulosis    DVT (deep venous thrombosis) (HCC) 2003   after sx   GERD (gastroesophageal reflux disease)    Grief reaction    on fluoxetine   Headache(784.0)    migraine   Hiatal hernia    Hyperlipidemia    Hypertension    OA (ocular albinism) (HCC)    Old myocardial infarction    Osteopenia    PUD (peptic ulcer disease)    gastric, past   Shingles     Stroke (HCC) 8/10   posterior reversible encephalopathy syndrome   Urine incontinence     Medications:  No anticoagulation prior to admission per my chart review. Medication reconciliation is pending  Assessment: 89 y/o F with medical history as above presenting to the ED 5/20 with chest pain. Troponin elevated, concern for ACS. Pharmacy consulted to initiate and manage heparin infusion for ACS.  Baseline CBC overall within normal limits. Baseline aPTT 34s and INR 1.3   Date/time HL  Comments 0521 [phone]  HL 0.16  subthera; 600 un/hr 0521 1224  HL <0.10  SUB-thera; 800 un/hr (line infiltrated around time of draw, infusion moved to other line)  0522 0418  HL 0.18   SUB-therapeutic 0522 1417 HL 0.27  Subtherapeutic @ 1100 units/hr > 1200 un/hr  Goal of Therapy:  Heparin level 0.3-0.7 units/ml Monitor platelets by anticoagulation protocol: Yes   Plan:  - Will order heparin 800 units IV X 1 bolus and increase drip rate to 1200 units/hr - Will recheck HL 8 hrs after rate change --Daily CBC per protocol while on IV heparin  Miski Feldpausch Rodriguez-Guzman PharmD, BCPS 06/18/2022 3:46 PM

## 2022-06-18 NOTE — TOC Initial Note (Signed)
Transition of Care [facility]) - Initial/Assessment Note    Patient Details  Name: Lydia King MRN: [id number] Date of Birth: 1930/08/13  Transition [facility] Care [facility]) CM/SW Contact:    Margarito Liner, LCSW Phone Number: 06/18/2022, 1:43 PM  Clinical Narrative:  Patient asleep. Daughters and granddaughter at bedside. CSW introduced role and explained that discharge planning would be discussed. Daughter confirmed she is a long-term resident from [facility] and plan is to return at discharge. Patient has been there almost 3 years. No further concerns. CSW encouraged patient's family to contact CSW as needed. CSW will continue to follow patient for support and facilitate return to SNF once medically stable.                Expected Discharge Plan: Skilled Nursing Facility Barriers to Discharge: Continued Medical Work up   Patient Goals and CMS Choice            Expected Discharge Plan and Services     Post Acute Care Choice: Resumption of Svcs/PTA Provider Living arrangements for the past 2 months: Skilled Nursing Facility                                      Prior Living Arrangements/Services Living arrangements for the past 2 months: Skilled Nursing Facility Lives with:: Facility Resident Patient language and need for interpreter reviewed:: Yes Do you feel safe going back to the place where you live?: Yes      Need for Family Participation in Patient Care: Yes (Comment) Care giver support system in place?: Yes (comment)   Criminal Activity/Legal Involvement Pertinent to Current Situation/Hospitalization: No - Comment as needed  Activities of Daily Living Home Assistive Devices/Equipment: Environmental consultant (specify type), Wheelchair ADL Screening (condition at time of admission) Patient's cognitive ability adequate to safely complete daily activities?: Yes Is the patient deaf or have difficulty hearing?: No Does the patient have difficulty seeing, even when wearing  glasses/contacts?: No Does the patient have difficulty concentrating, remembering, or making decisions?: Yes Patient able to express need for assistance with ADLs?: Yes Does the patient have difficulty dressing or bathing?: Yes Independently performs ADLs?: No Communication: Independent Dressing (OT): Needs assistance Is this a change from baseline?: Pre-admission baseline Grooming: Independent Feeding: Independent Bathing: Needs assistance Is this a change from baseline?: Pre-admission baseline Toileting: Needs assistance Is this a change from baseline?: Pre-admission baseline In/Out Bed: Needs assistance Is this a change from baseline?: Pre-admission baseline Walks in Home: Needs assistance Is this a change from baseline?: Pre-admission baseline Does the patient have difficulty walking or climbing stairs?: Yes Weakness of Legs: Both Weakness of Arms/Hands: None  Permission Sought/Granted Permission sought to share information with : Facility Medical sales representative, Family Supports    Share Information with NAME: Malena Peer and Laurance Flatten  Permission granted to share info w AGENCY: [facility]  Permission granted to share info w Relationship: Daughters  Permission granted to share info w Contact Information: Claris Gladden: [phone], Dennie Bible: [phone]  Emotional Assessment Appearance:: Appears stated age Attitude/Demeanor/Rapport: Unable to Assess Affect (typically observed): Unable to Assess Orientation: : Oriented to Self, Oriented to Place, Oriented to  Time, Oriented to Situation Alcohol / Substance Use: Not Applicable Psych Involvement: No (comment)  Admission diagnosis:  NSTEMI (non-ST elevated myocardial infarction) [facility]) [I21.4] Patient Active Problem List   Diagnosis Date Noted   Hyponatremia 06/17/2022   Hypotension 06/17/2022  Acute respiratory failure with hypoxia (HCC) 06/17/2022   Elevated troponin 06/17/2022   NSTEMI (non-ST elevated myocardial  infarction) (HCC) 06/16/2022   HLD (hyperlipidemia) 06/16/2022   HTN (hypertension) 06/16/2022   Depression with anxiety 06/16/2022   Leukocytosis 06/16/2022   Elevated lactic acid level 06/16/2022   AKI (acute kidney injury) (HCC) 06/16/2022   Mouth ulcers 05/19/2021   Dysphagia 10/08/2020   Pain due to onychomycosis of toenails of both feet 11/24/2019   Anxiety 11/09/2019   Chronic diastolic CHF (congestive heart failure) (HCC) 11/03/2019   Large hiatal hernia    Chest pain 11/02/2019   CAD (coronary artery disease) 08/01/2019   Anemia 08/01/2019   Acute on chronic congestive heart failure (HCC) 07/31/2019   Acute decompensated heart failure (HCC) 07/03/2019   Bradycardia 06/17/2019   Hypoalbuminemia 06/17/2019   Moderate aortic stenosis 06/15/2019   Mitral valve regurgitation 06/15/2019   Positive cardiac stress test 06/13/2019   Periprosthetic fracture around internal prosthetic right knee joint 03/15/2019   Hyperlipidemia associated with type 2 diabetes mellitus (HCC) 07/27/2018   Sinus congestion 02/05/2018   Vertigo 05/06/2017   OSA (obstructive sleep apnea) 01/02/2016   Pedal edema 01/02/2016   Fall in home 12/11/2015   Poor balance 12/11/2015   Low back pain with sciatica 10/02/2015   Right hip pain 10/02/2015   Hearing loss 04/18/2015   Encounter for Medicare annual wellness exam 10/04/2013   Type 2 diabetes mellitus without complications (HCC) 08/31/2012   History of CVA (cerebrovascular accident) 09/12/2008   Osteoarthritis of multiple joints 05/30/2008   Osteoporosis 05/30/2008   Vitamin D deficiency 07/14/2007   Sleep apnea 07/14/2007   Essential hypertension 11/30/2006   Asthma 11/30/2006   H/O gastroesophageal reflux (GERD) 11/30/2006   G I BLEED 11/30/2006   DEEP VENOUS THROMBOPHLEBITIS, HX OF 11/30/2006   PCP:  Judy Pimple, MD Pharmacy:   [facility] - [place], [place] - [address] [place]  [address] Phone: [phone] Fax: [phone]     Social Determinants of Health (SDOH) Social History: SDOH Screenings   Food Insecurity: No Food Insecurity (06/17/2022)  Housing: Low Risk  (06/17/2022)  Transportation Needs: No Transportation Needs (06/17/2022)  Utilities: Not At Risk (06/17/2022)  Depression (PHQ2-9): Low Risk  (10/31/2019)  Tobacco Use: Low Risk  (06/17/2022)   SDOH Interventions:     Readmission Risk Interventions     No data to display

## 2022-06-18 NOTE — Plan of Care (Signed)

## 2022-06-18 NOTE — Consult Note (Signed)
Pulmonary Critical Care  Initial Consult Note  Lydia King:[id number] DOB: Jun 19, 1930 DOA: 06/16/2022  Referring physician: Dr Clyde Lundborg  Chief Complaint: Possible pulmonary embolism and thrombolytic therapy.  HPI: Lydia King is a 89 y.o. female patient has multiple medical problems is resident of skilled nursing facility came into the hospital with chest pain going on for relatively short duration of 3 days.  Patient was a squeezing type of pain was going into the back and was pleuritic in nature.  Patient did not have any shortness of breath or cough associated.  There were no fevers or chills noted.  The patient was found in the ED to have a troponin which was elevated BUN BNP which was elevated and was also found to have acute renal failure.  Patient was admitted to the hospital seen by cardiology with some atypical symptoms relatively speaking patient was noted to have possibility of demand ischemia secondary to respiratory distress.  It was felt that due to her underlying chronic conditions and comorbidities she would not a candidate for invasive intervention.  Further evaluation patient had a VQ scan which was intermediate probability for pulmonary embolism.  I am asked to see the patient for question of whether she would be a candidate for aggressive thrombolytic therapy.  Review of Systems:  Remainder ROS performed and is unremarkable other than noted in HPI  Past Medical History:  Diagnosis Date   (HFpEF) heart failure with preserved ejection fraction (HCC)    a. 06/2019 Echo ([facility]): EF >55%, mild conc LVH. Mild-mod AS, mild MR/TR.   Anemia    in past (from blood loss at hiatal hernia)   Asthma    CAD (coronary artery disease)    a. 05/2019 MI/PCI: ost LCX (rota + cba-->3.0x16 Synergy DES), LAD 90 (3.0x16 Synergy DES).   Charcot-Marie-Tooth disease    walks with cane (fairly controlled)   Diverticulosis    DVT (deep venous thrombosis) (HCC) 2003   after sx   GERD  (gastroesophageal reflux disease)    Grief reaction    on fluoxetine   Headache(784.0)    migraine   Hiatal hernia    Hyperlipidemia    Hypertension    OA (ocular albinism) (HCC)    Old myocardial infarction    Osteopenia    PUD (peptic ulcer disease)    gastric, past   Shingles    Stroke (HCC) 8/10   posterior reversible encephalopathy syndrome   Urine incontinence    Past Surgical History:  Procedure Laterality Date   carotid dopplers  8/10   COLONOSCOPY  2002   HERNIA REPAIR  2002   bleeding   HIATAL HERNIA REPAIR  2000   needed blood transfusion   Stroke  8/10   hosp stroke/ posterior (vs posterior reversible encephalopathy syndrome) - d/c on Plavix   TEE normal  8/10   no emoblic source   TOTAL KNEE ARTHROPLASTY  03,06   Bilateral   VESICOVAGINAL FISTULA CLOSURE W/ TAH  1964   with bleeding and cervical dysplasia   Social History:  reports that she has never smoked. She has never used smokeless tobacco. She reports that she does not drink alcohol and does not use drugs.  Allergies  Allergen Reactions   Morphine And Codeine Anaphylaxis   Other Shortness Of Breath and Other (See Comments)   Shrimp [Shellfish Allergy] Anaphylaxis    Anaphylaxis-swelling of throat/sob     Amlodipine    Amlodipine Besylate     REACTION: edema  Atorvastatin     REACTION: pain- could not walk   Ciprofloxacin Other (See Comments)    REACTION: unkown Trouble breathing   Influenza Vaccines Other (See Comments)    Arm swelling, redness, chills, fever   Iodinated Contrast Media    Naproxen Other (See Comments)    Throat swelling   Nitrofurantoin    Omeprazole     REACTION: not effective   Pantoprazole Sodium     REACTION: does not work well   Pantoprazole Sodium    Penicillin G Hives    ITCHY RASH   Penicillins     REACTION: itchy rash   Simvastatin     REACTION: leg pain   Sulfa Antibiotics    Sulfonamide Derivatives     REACTION: itchy rash   Codeine Rash     REACTION: itchy rash ITCHY RASH, also breathing trouble   Prednisone Other (See Comments)    REACTION: RASH.// SHORTNESS OF BREATH Prior possible flushing reaction in face, but tolerated it in 2020 and 2021 up to 20mg     Family History  Problem Relation Age of Onset   Cerebral aneurysm Son    Cerebral aneurysm Father    Hyperlipidemia Father    Alcohol abuse Father    Stroke Mother    Cervical cancer Sister    Hyperlipidemia Brother    Alcohol abuse Brother    Stroke Brother    Diabetes Brother    Diabetes Sister    Sarcoidosis Sister    Other Sister        OP/ Broken hip   Other Other        whole family has charcot marie tooth   Ovarian cancer Sister     Prior to Admission medications   Medication Sig Start Date End Date Taking? Authorizing Provider  acetaminophen (TYLENOL) 325 MG tablet Take 975 mg by mouth 3 (three) times daily.   Yes [provider]  aspirin EC 81 MG EC tablet Take 1 tablet (81 mg total) by mouth daily. Swallow whole. 08/05/19  Yes Osvaldo Shipper, MD  Baclofen 5 MG TABS Take 5 mg by mouth daily. 06/08/22  Yes [provider]  calcium carbonate (TUMS - DOSED IN MG ELEMENTAL CALCIUM) 500 MG chewable tablet Chew 12 tablets (2,400 mg of elemental calcium total) by mouth 3 (three) times daily with meals. 05/17/21  Yes Tower, Audrie Gallus, MD  Carboxymethylcellulose Sod PF 1 % GEL Place 1 drop into both eyes at bedtime.   Yes [provider]  carvedilol (COREG) 6.25 MG tablet Take 1 tablet (6.25 mg total) by mouth 2 (two) times daily with a meal. 11/04/19  Yes Enedina Finner, MD  diclofenac Sodium (VOLTAREN) 1 % GEL Apply 2 g topically 3 (three) times daily. 03/28/20  Yes [provider]  Dulaglutide (TRULICITY) 0.75 MG/0.5ML SOPN Inject 0.75 mg into the skin once a week.   Yes [provider]  famotidine (PEPCID) 20 MG tablet Take 20 mg by mouth 2 (two) times daily.   Yes [provider]  fluconazole (DIFLUCAN) 100 MG  tablet Take 100 mg by mouth daily.   Yes [provider]  FLUoxetine (PROZAC) 10 MG capsule Take 30 mg by mouth daily. 11/20/19  Yes [provider]  furosemide (LASIX) 40 MG tablet Take 1 tablet (40 mg total) by mouth daily. 11/09/19  Yes Tower, Audrie Gallus, MD  Glycerin-Hypromellose-PEG 400 (ARTIFICIAL TEARS) 0.2-0.2-1 % SOLN Place 1 drop into both eyes 5 (five) times daily.  Yes [provider]  insulin glargine (LANTUS) 100 UNIT/ML injection Inject 4 Units into the skin at bedtime.   Yes [provider]  insulin lispro (HUMALOG) 100 UNIT/ML injection Inject 2 Units into the skin every evening.   Yes [provider]  Lidocaine HCl 4 % PTCH Apply 1 patch topically every 12 (twelve) hours.   Yes [provider]  lisinopril (ZESTRIL) 20 MG tablet Take 40 mg by mouth daily.   Yes [provider]  Lysine 500 MG CAPS Take 2 caps by mouth three times daily 05/17/21  Yes Tower, Audrie Gallus, MD  magic mouthwash SOLN Take 15 mLs by mouth 3 (three) times daily as needed for mouth pain. 1:1:1% Lido/Diphen/Maalox   Yes [provider]  melatonin 3 MG TABS tablet Take 3 mg by mouth at bedtime.   Yes [provider]  Multiple Vitamins-Minerals (PRESERVISION AREDS 2) CAPS Give one pill by mouth bid Patient taking differently: Take 2 capsules by mouth daily. 11/09/19  Yes Tower, Audrie Gallus, MD  oxyCODONE-acetaminophen (PERCOCET/ROXICET) 5-325 MG tablet Take 1 tablet by mouth every 6 (six) hours as needed for severe pain.   Yes [provider]  polyethylene glycol (MIRALAX / GLYCOLAX) 17 g packet Take 17 g by mouth daily. Patient taking differently: Take 17 g by mouth 2 (two) times daily. 11/11/19  Yes Tower, Audrie Gallus, MD  SitaGLIPtin-MetFORMIN HCl (JANUMET XR) 579 295 5102 MG TB24 Take 1 tablet by mouth daily.   Yes [provider]  traMADol (ULTRAM) 50 MG tablet Take 50 mg by mouth every 6 (six) hours as needed for moderate pain.  05/24/20  Yes [provider]  dexlansoprazole (DEXILANT) 60 MG capsule Take 1 capsule (60 mg total) by mouth daily. Patient not taking: Reported on 05/17/2021 07/20/20   Wyline Mood, MD  isosorbide mononitrate (IMDUR) 30 MG 24 hr tablet Take 1 tablet (30 mg total) by mouth 2 (two) times daily. 11/04/19 09/17/20  Enedina Finner, MD   Physical Exam: Vitals:   06/18/22 1400 06/18/22 1754 06/18/22 1917 06/18/22 1949  BP:  (!) 117/54 (!) 167/65   Pulse: 84 82 85   Resp: 20 18 20    Temp:  (!) 96.1 F (35.6 C) (!) 100.6 F (38.1 C) 99.8 F (37.7 C)  TempSrc:  Oral Axillary Oral  SpO2: 93% 98% 97%   Weight:      Height:       Body mass index is 28.29 kg/m.   Wt Readings from Last 3 Encounters:  06/18/22 61.4 kg  05/20/22 58 kg  01/28/22 59.8 kg    General:  Appears calm and comfortable Eyes: PERRL, normal lids, irises & conjunctiva ENT: grossly normal hearing, lips & tongue Neck: no LAD, masses or thyromegaly Cardiovascular: RRR, no m/r/g. No LE edema. Respiratory: CTA bilaterally, no w/r/r.       Normal respiratory effort. Abdomen: soft, nontender Skin: no rash or induration seen on limited exam Musculoskeletal: grossly normal tone BUE/BLE Psychiatric: grossly normal mood and affect Neurologic: grossly non-focal.          Labs on Admission:  Basic Metabolic Panel: Recent Labs  Lab 06/16/22 1454 06/17/22 0316 06/18/22 0418  NA 130* 130* 128*  K 3.9 4.2 3.7  CL 96* 98 97*  CO2 22 22 23   GLUCOSE 228* 229* 259*  BUN 43* 47* 50*  CREATININE 1.41* 1.29* 1.07*  CALCIUM 9.6 9.5 9.2  MG  --   --  2.5*   Liver Function Tests: No  results for input(s): "AST", "ALT", "ALKPHOS", "BILITOT", "PROT", "ALBUMIN" in the last 168 hours. No results for input(s): "LIPASE", "AMYLASE" in the last 168 hours. No results for input(s): "AMMONIA" in the last 168 hours. CBC: Recent Labs  Lab 06/16/22 1454 06/17/22 0104 06/18/22 0418  WBC 17.9* 15.8* 9.8  HGB 13.2 12.6 11.2*  HCT  40.0 37.2 32.9*  MCV 93.5 94.2 92.7  PLT 140* 175 136*   Cardiac Enzymes: No results for input(s): "CKTOTAL", "CKMB", "CKMBINDEX", "TROPONINI" in the last 168 hours.  BNP (last 3 results) Recent Labs    06/16/22 1434  BNP 1,311.2*    ProBNP (last 3 results) No results for input(s): "PROBNP" in the last 8760 hours.  CBG: Recent Labs  Lab 06/17/22 1727 06/17/22 2119 06/18/22 0747 06/18/22 1213 06/18/22 1759  GLUCAP 329* 242* 217* 191* 179*    Radiological Exams on Admission: DG Chest Port 1 View  Result Date: 06/18/2022 CLINICAL DATA:  Pneumonia EXAM: PORTABLE CHEST 1 VIEW COMPARISON:  06/16/2022 FINDINGS: Single frontal view of the chest demonstrates stable enlargement of the cardiac silhouette. Since the prior exam, dense areas of consolidation of developed in the left upper lobe compatible with pneumonia. There is more patchy airspace disease elsewhere within the lungs. Continued hiatal hernia. No effusion or pneumothorax. No acute bony abnormalities. IMPRESSION: 1. Interval development of airspace disease, with dense left upper lobe consolidation most evident. Findings are consistent with multifocal pneumonia, though underlying sequela of pulmonary infarct cannot be excluded given recent V/Q scan findings. Electronically Signed   By: Sharlet Salina M.D.   On: 06/18/2022 15:08   NM Pulmonary Perfusion  Result Date: 06/17/2022 CLINICAL DATA:  Shortness of breath, high clinical suspicion for PE EXAM: NUCLEAR MEDICINE PERFUSION LUNG SCAN TECHNIQUE: Perfusion images were obtained in multiple projections after intravenous injection of radiopharmaceutical. Ventilation scans intentionally deferred if perfusion scan and chest x-ray adequate for interpretation during COVID 19 epidemic. RADIOPHARMACEUTICALS:  3.99 mCi Tc-64m MAA IV COMPARISON:  Chest radiograph done on 06/16/2022 FINDINGS: There are multiple perfusion defects of varying sizes in both lungs. Some of the defects appear to be  which shaped. In the chest radiograph done on 06/16/2022, no focal pulmonary infiltrates were noted. Evaluation is limited without ventilation images. IMPRESSION: There are multiple perfusion defects all vein sizes in both lungs. Some of the defects show wedge shape. There are no matching infiltrates in the lung fields in the chest radiograph done on 06/16/2022. Findings suggest intermediate to high probability for PE. Evaluation is limited without ventilation images. If clinically feasible, CT pulmonary angiogram should be considered. Electronically Signed   By: Ernie Avena M.D.   On: 06/17/2022 14:57   ECHOCARDIOGRAM COMPLETE  Result Date: 06/17/2022    ECHOCARDIOGRAM REPORT   Patient Name:   TAISHA PENNEBAKER Date of Exam: 06/17/2022 Medical Rec #:  [id number]        Height:       58.0 in Accession #:    [id number]       Weight:       127.9 lb Date of Birth:  08/28/1930        BSA:          1.506 m Patient Age:    89 years         BP:           90/45 mmHg Patient Gender: F                HR:  81 bpm. Exam Location:  [facility] Procedure: 2D Echo, Cardiac Doppler and Color Doppler Indications:     Chest pain R07.9                  Aortic stenosis I35.0  History:         Patient has no prior history of Echocardiogram examinations.                  CAD; Risk Factors:Hypertension and Dyslipidemia.  Sonographer:     Cristela Blue Referring Phys:  [id number] SHERI HAMMOCK Diagnosing Phys: Cristal Deer End MD  Sonographer Comments: Technically challenging study due to limited acoustic windows and suboptimal apical window. IMPRESSIONS  1. Left ventricular ejection fraction, by estimation, is 65 to 70%. The left ventricle has normal function. Left ventricular endocardial border not optimally defined to evaluate regional wall motion. Left ventricular diastolic parameters are consistent with Grade II diastolic dysfunction (pseudonormalization). Elevated left atrial pressure.  2. Right ventricular systolic function is  normal. The right ventricular size is normal. There is severely elevated pulmonary artery systolic pressure. The estimated right ventricular systolic pressure is 63.1 mmHg.  3. Left atrial size was moderately dilated.  4. The mitral valve is degenerative. Trivial mitral valve regurgitation. No evidence of mitral stenosis. Moderate mitral annular calcification.  5. Tricuspid valve regurgitation is moderate to severe.  6. The aortic valve has an indeterminant number of cusps. There is severe calcifcation of the aortic valve. There is severe thickening of the aortic valve. Aortic valve regurgitation is not visualized. There is at least mild to moderate aortic stenosis,  though degree of stenosis appears more severe by visual estimation. Poor Doppler envelopes likely underestimate the aortic valve gradient.  7. Pulmonic valve regurgitation not well assessed.  8. The inferior vena cava is normal in size with <50% respiratory variability, suggesting right atrial pressure of 8 mmHg. FINDINGS  Left Ventricle: Left ventricular ejection fraction, by estimation, is 65 to 70%. The left ventricle has normal function. Left ventricular endocardial border not optimally defined to evaluate regional wall motion. The left ventricular internal cavity size was normal in size. There is no left ventricular hypertrophy. Left ventricular diastolic parameters are consistent with Grade II diastolic dysfunction (pseudonormalization). Elevated left atrial pressure. Right Ventricle: The right ventricular size is normal. No increase in right ventricular wall thickness. Right ventricular systolic function is normal. There is severely elevated pulmonary artery systolic pressure. The tricuspid regurgitant velocity is 3.71 m/s, and with an assumed right atrial pressure of 8 mmHg, the estimated right ventricular systolic pressure is 63.1 mmHg. Left Atrium: Left atrial size was moderately dilated. Right Atrium: Right atrial size was normal in size.  Pericardium: The pericardium was not well visualized. Mitral Valve: The mitral valve is degenerative in appearance. Moderate mitral annular calcification. Trivial mitral valve regurgitation. No evidence of mitral valve stenosis. MV peak gradient, 8.9 mmHg. The mean mitral valve gradient is 3.0 mmHg. Tricuspid Valve: The tricuspid valve is normal in structure. Tricuspid valve regurgitation is moderate to severe. Aortic Valve: The aortic valve has an indeterminant number of cusps. There is severe calcifcation of the aortic valve. There is severe thickening of the aortic valve. Aortic valve regurgitation is not visualized. There is at least mild to moderate aortic  stenosis, though degree of stenosis appears more severe by visual estimation. Poor Doppler envelopes likely underestimate the aortic valve gradient. Aortic valve mean gradient measures 12.0 mmHg. Aortic valve peak gradient measures 21.1 mmHg. Aortic valve area, by VTI  measures 1.33 cm. Pulmonic Valve: The pulmonic valve was not well visualized. Pulmonic valve regurgitation not well assessed. Aorta: The aortic root was not well visualized. Pulmonary Artery: The pulmonary artery is not well seen. Venous: The inferior vena cava is normal in size with less than 50% respiratory variability, suggesting right atrial pressure of 8 mmHg. IAS/Shunts: The interatrial septum was not well visualized.  LEFT VENTRICLE PLAX 2D LVIDd:         2.90 cm   Diastology LVIDs:         2.10 cm   LV e' medial:    6.85 cm/s LV PW:         0.90 cm   LV E/e' medial:  18.4 LV IVS:        0.90 cm   LV e' lateral:   8.05 cm/s LVOT diam:     2.00 cm   LV E/e' lateral: 15.7 LV SV:         53 LV SV Index:   35 LVOT Area:     3.14 cm  RIGHT VENTRICLE RV Basal diam:  3.70 cm RV Mid diam:    3.40 cm LEFT ATRIUM             Index        RIGHT ATRIUM           Index LA diam:        4.30 cm 2.86 cm/m   RA Area:     15.10 cm LA Vol (A2C):   73.0 ml 48.48 ml/m  RA Volume:   36.20 ml  24.04 ml/m  LA Vol (A4C):   69.2 ml 45.96 ml/m LA Biplane Vol: 71.5 ml 47.48 ml/m  AORTIC VALVE AV Area (Vmax):    1.08 cm AV Area (Vmean):   1.04 cm AV Area (VTI):     1.33 cm AV Vmax:           229.60 cm/s AV Vmean:          157.200 cm/s AV VTI:            0.400 m AV Peak Grad:      21.1 mmHg AV Mean Grad:      12.0 mmHg LVOT Vmax:         79.00 cm/s LVOT Vmean:        51.800 cm/s LVOT VTI:          0.170 m LVOT/AV VTI ratio: 0.42 MITRAL VALVE                TRICUSPID VALVE MV Area (PHT): 3.83 cm     TR Peak grad:   55.1 mmHg MV Area VTI:   1.59 cm     TR Vmax:        371.00 cm/s MV Peak grad:  8.9 mmHg MV Mean grad:  3.0 mmHg     SHUNTS MV Vmax:       1.49 m/s     Systemic VTI:  0.17 m MV Vmean:      86.2 cm/s    Systemic Diam: 2.00 cm MV Decel Time: 198 msec MV E velocity: 126.00 cm/s MV A velocity: 96.40 cm/s MV E/A ratio:  1.31 Yvonne Kendall MD Electronically signed by Yvonne Kendall MD Signature Date/Time: 06/17/2022/12:56:27 PM    Final    DG Chest Port 1 View  Result Date: 06/16/2022 CLINICAL DATA:  Upper abdomen pain EXAM: PORTABLE CHEST 1 VIEW COMPARISON:  06/16/2022, 12/11/2021 FINDINGS: Redemonstrated large hiatal  hernia. Cardiac silhouette is probably enlarged. No acute airspace disease or effusion. No pneumothorax. IMPRESSION: No active disease. Large hiatal hernia. Electronically Signed   By: Jasmine Pang M.D.   On: 06/16/2022 22:35    EKG: Independently reviewed.  Assessment/Plan Principal Problem:   NSTEMI (non-ST elevated myocardial infarction) (HCC) Active Problems:   CAD (coronary artery disease)   Chronic diastolic CHF (congestive heart failure) (HCC)   Essential hypertension   History of CVA (cerebrovascular accident)   HLD (hyperlipidemia)   Asthma   Type 2 diabetes mellitus without complications (HCC)   Leukocytosis   Elevated lactic acid level   AKI (acute kidney injury) (HCC)   H/O gastroesophageal reflux (GERD)   Depression with anxiety   Large hiatal hernia    Moderate aortic stenosis   Hyponatremia   Hypotension   Acute respiratory failure with hypoxia (HCC)   Elevated troponin   Possible pulmonary embolism based on the VQ scan the likelihood of pulmonary embolism is intermediate and based on her clinical scenario I do not think her likelihood is high.  In addition because of her age because of other comorbidities multiple medical problems history of strokes I do not believe that she is a good candidate for thrombolytic therapy.  I would recommend conservative therapy at this time and monitoring. Acute renal failure this is improved patient is going to be followed along with labs. Dysphagia would continue with aspiration precautions and supportive care.   Time spent: 70 minutes in addition to communication with the referring primary physician.  I have personally obtained a history, examined the patient, evaluated laboratory and imaging results, formulated the assessment and plan and placed orders.  The Patient requires high complexity decision making for assessment and support. Total Time Spent   Yevonne Pax, MD [facility] Pulmonary Critical Care Medicine Sleep Medicine

## 2022-06-18 NOTE — Progress Notes (Signed)
Cardiology Progress Note   Patient Name: Lydia King Date of Encounter: 06/18/2022  Primary Cardiologist: Julien Nordmann, MD  Subjective   On high flow nasal cannula.  Minimally conversive.  Family at bedside.  Family notes that she has been breathing better.  Patient has no complaints.  Inpatient Medications    Scheduled Meds:  aspirin EC  81 mg Oral Daily   baclofen  5 mg Oral Daily   calcium carbonate  400 mg of elemental calcium Oral TID WC   carvedilol  3.125 mg Oral BID WC   famotidine  20 mg Oral QHS   fluconazole  100 mg Oral Daily   FLUoxetine  30 mg Oral Daily   insulin aspart  0-5 Units Subcutaneous QHS   insulin aspart  0-9 Units Subcutaneous TID WC   insulin glargine-yfgn  3 Units Subcutaneous QHS   ipratropium-albuterol  3 mL Nebulization Q6H   lidocaine  1 patch Transdermal Q24H   melatonin  2.5 mg Oral QHS   multivitamin  2 tablet Oral Daily   polyvinyl alcohol  1 drop Both Eyes 5 X Daily   Continuous Infusions:  cefTRIAXone (ROCEPHIN)  IV Stopped (06/18/22 0745)   heparin 1,100 Units/hr (06/18/22 0545)   PRN Meds: acetaminophen, albuterol, alum & mag hydroxide-simeth, dextromethorphan-guaiFENesin, fentaNYL (SUBLIMAZE) injection, hydrALAZINE, nitroGLYCERIN, ondansetron (ZOFRAN) IV, mouth rinse, oxyCODONE-acetaminophen, polyethylene glycol   Vital Signs    Vitals:   06/18/22 0744 06/18/22 0809 06/18/22 1229 06/18/22 1400  BP: (!) 116/57  (!) 143/62   Pulse: 88 86 79 84  Resp: 18 18 18 20   Temp: 99.3 F (37.4 C)  (!) 97.2 F (36.2 C)   TempSrc: Oral  Oral   SpO2: 97% 96% 93% 93%  Weight:      Height:        Intake/Output Summary (Last 24 hours) at 06/18/2022 1500 Last data filed at 06/18/2022 0400 Gross per 24 hour  Intake 787.04 ml  Output 300 ml  Net 487.04 ml   Filed Weights   06/16/22 1445 06/18/22 0400  Weight: 58 kg 61.4 kg    Physical Exam   GEN: Well nourished, well developed, in no acute distress.  HEENT: Grossly normal.   Neck: Supple, no JVD, carotid bruits, or masses. Cardiac: RRR, 3/6 systolic murmur throughout.  No rubs or gallops. No clubbing, cyanosis, edema.  Radials 2+, DP/PT 2+ and equal bilaterally.  Respiratory:  Respirations regular and unlabored, clear to auscultation bilaterally. GI: Soft, nontender, nondistended, BS + x 4. MS: no deformity or atrophy. Skin: warm and dry, no rash. Neuro:  minimally conversive. Psych: opens eyes to name.  flat affect.  Labs    Chemistry Recent Labs  Lab 06/16/22 1454 06/17/22 0316 06/18/22 0418  NA 130* 130* 128*  K 3.9 4.2 3.7  CL 96* 98 97*  CO2 22 22 23   GLUCOSE 228* 229* 259*  BUN 43* 47* 50*  CREATININE 1.41* 1.29* 1.07*  CALCIUM 9.6 9.5 9.2  GFRNONAA 36* 40* 50*  ANIONGAP 12 10 8      Hematology Recent Labs  Lab 06/16/22 1454 06/17/22 0104 06/18/22 0418  WBC 17.9* 15.8* 9.8  RBC 4.28 3.95 3.55*  HGB 13.2 12.6 11.2*  HCT 40.0 37.2 32.9*  MCV 93.5 94.2 92.7  MCH 30.8 31.9 31.5  MCHC 33.0 33.9 34.0  RDW 13.0 13.2 13.2  PLT 140* 175 136*    Cardiac Enzymes  Recent Labs  Lab 06/16/22 1454 06/16/22 1703 06/16/22 2122 06/17/22 0104 06/17/22  0316  TROPONINIHS 415* 398* 307* 277* 278*      BNP    Component Value Date/Time   BNP 1,311.2 (H) 06/16/2022 1434    ProBNP    Component Value Date/Time   PROBNP 106.0 (H) 12/28/2008 0000   Lipids  Lab Results  Component Value Date   CHOL 222 (H) 06/17/2022   HDL 71 06/17/2022   LDLCALC 117 (H) 06/17/2022   LDLDIRECT 78.0 07/14/2016   TRIG 170 (H) 06/17/2022   CHOLHDL 3.1 06/17/2022    HbA1c  Lab Results  Component Value Date   HGBA1C 6.6 (H) 06/17/2022    Radiology    NM Pulmonary Perfusion  Result Date: 06/17/2022 CLINICAL DATA:  Shortness of breath, high clinical suspicion for PE EXAM: NUCLEAR MEDICINE PERFUSION LUNG SCAN TECHNIQUE: Perfusion images were obtained in multiple projections after intravenous injection of radiopharmaceutical. Ventilation scans  intentionally deferred if perfusion scan and chest x-ray adequate for interpretation during COVID 19 epidemic. RADIOPHARMACEUTICALS:  3.99 mCi Tc-20m MAA IV COMPARISON:  Chest radiograph done on 06/16/2022 FINDINGS: There are multiple perfusion defects of varying sizes in both lungs. Some of the defects appear to be which shaped. In the chest radiograph done on 06/16/2022, no focal pulmonary infiltrates were noted. Evaluation is limited without ventilation images. IMPRESSION: There are multiple perfusion defects all vein sizes in both lungs. Some of the defects show wedge shape. There are no matching infiltrates in the lung fields in the chest radiograph done on 06/16/2022. Findings suggest intermediate to high probability for PE. Evaluation is limited without ventilation images. If clinically feasible, CT pulmonary angiogram should be considered. Electronically Signed   By: Ernie Avena M.D.   On: 06/17/2022 14:57   DG Chest Port 1 View  Result Date: 06/16/2022 CLINICAL DATA:  Upper abdomen pain EXAM: PORTABLE CHEST 1 VIEW COMPARISON:  06/16/2022, 12/11/2021 FINDINGS: Redemonstrated large hiatal hernia. Cardiac silhouette is probably enlarged. No acute airspace disease or effusion. No pneumothorax. IMPRESSION: No active disease. Large hiatal hernia. Electronically Signed   By: Jasmine Pang M.D.   On: 06/16/2022 22:35   DG Chest 2 View  Result Date: 06/16/2022 CLINICAL DATA:  Chest pain. EXAM: CHEST - 2 VIEW COMPARISON:  December 11, 2021. FINDINGS: Stable cardiomediastinal silhouette. Large hiatal hernia is again noted. No acute pulmonary disease is noted. Degenerative changes seen involving the right glenohumeral joint. IMPRESSION: No acute abnormality seen.  Large hiatal hernia. Electronically Signed   By: Lupita Raider M.D.   On: 06/16/2022 15:11    Telemetry    RSR, PACs - Personally Reviewed  Cardiac Studies   2D Echocardiogram 5.21.2024   1. Left ventricular ejection fraction, by  estimation, is 65 to 70%. The  left ventricle has normal function. Left ventricular endocardial border  not optimally defined to evaluate regional wall motion. Left ventricular  diastolic parameters are consistent  with Grade II diastolic dysfunction (pseudonormalization). Elevated left  atrial pressure.   2. Right ventricular systolic function is normal. The right ventricular  size is normal. There is severely elevated pulmonary artery systolic  pressure. The estimated right ventricular systolic pressure is 63.1 mmHg.   3. Left atrial size was moderately dilated.   4. The mitral valve is degenerative. Trivial mitral valve regurgitation.  No evidence of mitral stenosis. Moderate mitral annular calcification.   5. Tricuspid valve regurgitation is moderate to severe.   6. The aortic valve has an indeterminant number of cusps. There is severe  calcifcation of the aortic valve. There  is severe thickening of the aortic  valve. Aortic valve regurgitation is not visualized. There is at least  mild to moderate aortic stenosis,   though degree of stenosis appears more severe by visual estimation. Poor  Doppler envelopes likely underestimate the aortic valve gradient.   7. Pulmonic valve regurgitation not well assessed.   8. The inferior vena cava is normal in size with <50% respiratory  variability, suggesting right atrial pressure of 8 mmHg.    Patient Profile     88 y.o. female with past medical history of coronary disease status post PCI to LAD and circumflex May 2021, moderate aortic stenosis, HFpEF, hypertension, hyperlipidemia, type 2 diabetes, stroke, hiatal hernia, anemia, asthma, and history of DVT, who is being seen and evaluated for chest pain and elevated high-sensitivity troponin in the setting of presumed PE.  Assessment & Plan    1.  Chest pain/Elevated HsTrop: Patient presented March 20 with a 3-day history of chest pain, which was more pleuritic in nature however, troponins were  elevated to a peak of 415 with subsequent downtrend.  VQ scan has since returned intermediate to high probability for PE while echo shows normal LV and RV function with RVSP of 63.1 mmHg.  CT of the chest performed in the setting of mild AKI and contrast allergy.  She remains on heparin therapy, with eventual plan to transition to oral anticoagulation once it is clear she will not require any invasive procedures.  At this time, it does not appear that additional ischemic evaluation is warranted though if CT of the chest were to be performed and did not show PE, we would reconsider.  Continue aspirin, beta-blocker.  Prior intolerance to statins.  2.  Intermediate to high probability pulmonary embolism: See #1.  Currently on heparin with plan to transition to oral anticoagulation once it is clear that invasive evaluation would not be required.  3.  Chronic HFpEF: BNP was 1311.2.  She received intravenous Lasix yesterday.  Output unclear as she has had some incontinence.  BUN slightly higher this morning with creatinine that is downtrending.  Heart rate has been stable.  Blood pressure variable.  4.  Essential hypertension: As above, blood pressure has been variable.  She remains on beta-blocker therapy.  Home dose of lisinopril, clonidine, and isosorbide have been held in the setting of hypotension on admission.  Follow and resume home medications as able.  5.  Hyperlipidemia: LDL 217.  Prior statin intolerance.  Could consider Zetia though with advanced age and comorbidities, utility unclear.  6.  Large ventral hernia: Chronic pain associated with hiatal hernia.  Previously evaluated by surgery but felt to be too high risk and has been managed conservatively.  7.  Acute kidney injury: Creatinine improving.  Follow.  Signed, Nicolasa Ducking, NP  06/18/2022, 3:00 PM    For questions or updates, please contact   Please consult [email or web address] for contact info under Cardiology/STEMI.

## 2022-06-19 ENCOUNTER — Inpatient Hospital Stay: Payer: Medicare Other

## 2022-06-19 DIAGNOSIS — J9601 Acute respiratory failure with hypoxia: Secondary | ICD-10-CM

## 2022-06-19 DIAGNOSIS — K449 Diaphragmatic hernia without obstruction or gangrene: Secondary | ICD-10-CM

## 2022-06-19 DIAGNOSIS — I2489 Other forms of acute ischemic heart disease: Secondary | ICD-10-CM

## 2022-06-19 DIAGNOSIS — I272 Pulmonary hypertension, unspecified: Secondary | ICD-10-CM | POA: Diagnosis not present

## 2022-06-19 DIAGNOSIS — I25118 Atherosclerotic heart disease of native coronary artery with other forms of angina pectoris: Secondary | ICD-10-CM | POA: Diagnosis not present

## 2022-06-19 DIAGNOSIS — R7989 Other specified abnormal findings of blood chemistry: Secondary | ICD-10-CM | POA: Diagnosis not present

## 2022-06-19 LAB — COMPREHENSIVE METABOLIC PANEL
ALT: 22 U/L (ref 0–44)
AST: 9 U/L — ABNORMAL LOW (ref 15–41)
Albumin: 2.2 g/dL — ABNORMAL LOW (ref 3.5–5.0)
Alkaline Phosphatase: 74 U/L (ref 38–126)
Anion gap: 10 (ref 5–15)
BUN: 46 mg/dL — ABNORMAL HIGH (ref 8–23)
CO2: 19 mmol/L — ABNORMAL LOW (ref 22–32)
Calcium: 9.5 mg/dL (ref 8.9–10.3)
Chloride: 100 mmol/L (ref 98–111)
Creatinine, Ser: 0.94 mg/dL (ref 0.44–1.00)
GFR, Estimated: 58 mL/min — ABNORMAL LOW (ref >60)
Glucose, Bld: 203 mg/dL — ABNORMAL HIGH (ref 70–99)
Potassium: 3.5 mmol/L (ref 3.5–5.1)
Sodium: 129 mmol/L — ABNORMAL LOW (ref 135–145)
Total Bilirubin: 1.2 mg/dL (ref 0.3–1.2)
Total Protein: 5.2 g/dL — ABNORMAL LOW (ref 6.5–8.1)

## 2022-06-19 LAB — CBC
HCT: 33.9 % — ABNORMAL LOW (ref 36.0–46.0)
Hemoglobin: 11.3 g/dL — ABNORMAL LOW (ref 12.0–15.0)
MCH: 31 pg (ref 26.0–34.0)
MCHC: 33.3 g/dL (ref 30.0–36.0)
MCV: 92.9 fL (ref 80.0–100.0)
Platelets: 160 10*3/uL (ref 150–400)
RBC: 3.65 MIL/uL — ABNORMAL LOW (ref 3.87–5.11)
RDW: 13.2 % (ref 11.5–15.5)
WBC: 12.6 10*3/uL — ABNORMAL HIGH (ref 4.0–10.5)
nRBC: 0 % (ref 0.0–0.2)

## 2022-06-19 LAB — GLUCOSE, CAPILLARY
Glucose-Capillary: 219 mg/dL — ABNORMAL HIGH (ref 70–99)
Glucose-Capillary: 223 mg/dL — ABNORMAL HIGH (ref 70–99)
Glucose-Capillary: 242 mg/dL — ABNORMAL HIGH (ref 70–99)

## 2022-06-19 LAB — HEPARIN LEVEL (UNFRACTIONATED)
Heparin Unfractionated: 0.22 IU/mL — ABNORMAL LOW (ref 0.30–0.70)
Heparin Unfractionated: 0.37 IU/mL (ref 0.30–0.70)
Heparin Unfractionated: 0.4 IU/mL (ref 0.30–0.70)

## 2022-06-19 LAB — CULTURE, BLOOD (ROUTINE X 2): Special Requests: ADEQUATE

## 2022-06-19 MED ORDER — FUROSEMIDE 10 MG/ML IJ SOLN
20.0000 mg | Freq: Once | INTRAMUSCULAR | Status: AC
  Administered 2022-06-19: 20 mg via INTRAVENOUS
  Filled 2022-06-19: qty 2

## 2022-06-19 MED ORDER — HEPARIN BOLUS VIA INFUSION
800.0000 [IU] | Freq: Once | INTRAVENOUS | Status: AC
  Administered 2022-06-19: 800 [IU] via INTRAVENOUS
  Filled 2022-06-19: qty 800

## 2022-06-19 MED ORDER — ACETAMINOPHEN 10 MG/ML IV SOLN
1000.0000 mg | Freq: Once | INTRAVENOUS | Status: AC
  Administered 2022-06-19: 1000 mg via INTRAVENOUS
  Filled 2022-06-19: qty 100

## 2022-06-19 NOTE — Consult Note (Signed)
ANTICOAGULATION CONSULT NOTE  Pharmacy Consult for IV Heparin Indication: chest pain/ACS  Patient Measurements: Height: 4\' 10"  (147.3 cm) Weight: 59.1 kg (130 lb 4.7 oz) IBW/kg (Calculated) : 40.9 Heparin Dosing Weight: 53.2 kg  Labs: Recent Labs    06/16/22 1625 06/16/22 1703 06/16/22 2122 06/17/22 0104 06/17/22 0316 06/17/22 1224 06/18/22 0418 06/18/22 1417 06/18/22 2346 06/19/22 0612 06/19/22 0902  HGB  --   --   --  12.6  --   --  11.2*  --   --  11.3*  --   HCT  --   --   --  37.2  --   --  32.9*  --   --  33.9*  --   PLT  --   --   --  175  --   --  136*  --   --  160  --   APTT 34  --   --   --   --   --   --   --   --   --   --   LABPROT 16.6*  --   --   --   --   --   --   --   --   --   --   INR 1.3*  --   --   --   --   --   --   --   --   --   --   HEPARINUNFRC  --   --   --   --  0.16*   < > 0.18* 0.27* 0.37  --  0.22*  CREATININE  --   --   --   --  1.29*  --  1.07*  --   --  0.94  --   TROPONINIHS  --    < > 307* 277* 278*  --   --   --   --   --   --    < > = values in this interval not displayed.    Estimated Creatinine Clearance: 30.9 mL/min (by C-G formula based on SCr of 0.94 mg/dL).  Medical History: Past Medical History:  Diagnosis Date   (HFpEF) heart failure with preserved ejection fraction (HCC)    a. 06/2019 Echo ([facility]): EF >55%, mild conc LVH. Mild-mod AS, mild MR/TR.   Anemia    in past (from blood loss at hiatal hernia)   Asthma    CAD (coronary artery disease)    a. 05/2019 MI/PCI: ost LCX (rota + cba-->3.0x16 Synergy DES), LAD 90 (3.0x16 Synergy DES).   Charcot-Marie-Tooth disease    walks with cane (fairly controlled)   Diverticulosis    DVT (deep venous thrombosis) (HCC) 2003   after sx   GERD (gastroesophageal reflux disease)    Grief reaction    on fluoxetine   Headache(784.0)    migraine   Hiatal hernia    Hyperlipidemia    Hypertension    OA (ocular albinism) (HCC)    Old myocardial infarction    Osteopenia    PUD  (peptic ulcer disease)    gastric, past   Shingles    Stroke (HCC) 8/10   posterior reversible encephalopathy syndrome   Urine incontinence     Medications:  No anticoagulation prior to admission per my chart review. Medication reconciliation is pending  Assessment: 89 y/o F with medical history as above presenting to the ED 5/20 with chest pain. Troponin elevated, concern for ACS. Pharmacy consulted to initiate and  manage heparin infusion for ACS.  Baseline CBC overall within normal limits. Baseline aPTT 34s and INR 1.3   Date/time HL  Comments 0521 0316 HL 0.16  subthera; 600 un/hr 0521 1224 HL <0.10  SUB-thera; 800 un/hr (line infiltrated around time of draw, infusion moved to other line)  0522 0418 HL 0.18   SUB-therapeutic 0522 1417 HL 0.27  Subtherapeutic @ 1100 units/hr > 1200 un/hr 0522 2346 HL 0.37            Therapeutic X 1  0523 0902 HL 0.22  Subtherapeutic at 1200 > 1300  Goal of Therapy:  Heparin level 0.3-0.7 units/ml Monitor platelets by anticoagulation protocol: Yes   Plan:  - Give heparin 800 units IV bolus x 1 dose, then increase current rate to 1300 units/hr and recheck HL in 8hrs -Daily CBC per protocol while on IV heparin  Zahriyah Joo Rodriguez-Guzman PharmD, BCPS 06/19/2022 10:24 AM

## 2022-06-19 NOTE — Consult Note (Signed)
ANTICOAGULATION CONSULT NOTE  Pharmacy Consult for IV Heparin Indication: chest pain/ACS  Patient Measurements: Height: 4\' 10"  (147.3 cm) Weight: 61.4 kg (135 lb 5.8 oz) IBW/kg (Calculated) : 40.9 Heparin Dosing Weight: 53.2 kg  Labs: Recent Labs    06/16/22 1454 06/16/22 1625 06/16/22 1703 06/16/22 2122 06/17/22 0104 06/17/22 0316 06/17/22 1224 06/18/22 0418 06/18/22 1417 06/18/22 2346  HGB 13.2  --   --   --  12.6  --   --  11.2*  --   --   HCT 40.0  --   --   --  37.2  --   --  32.9*  --   --   PLT 140*  --   --   --  175  --   --  136*  --   --   APTT  --  34  --   --   --   --   --   --   --   --   LABPROT  --  16.6*  --   --   --   --   --   --   --   --   INR  --  1.3*  --   --   --   --   --   --   --   --   HEPARINUNFRC  --   --   --   --   --  0.16*   < > 0.18* 0.27* 0.37  CREATININE 1.41*  --   --   --   --  1.29*  --  1.07*  --   --   TROPONINIHS 415*  --    < > 307* 277* 278*  --   --   --   --    < > = values in this interval not displayed.    Estimated Creatinine Clearance: 27.6 mL/min (A) (by C-G formula based on SCr of 1.07 mg/dL (H)).  Medical History: Past Medical History:  Diagnosis Date   (HFpEF) heart failure with preserved ejection fraction (HCC)    a. 06/2019 Echo ([facility]): EF >55%, mild conc LVH. Mild-mod AS, mild MR/TR.   Anemia    in past (from blood loss at hiatal hernia)   Asthma    CAD (coronary artery disease)    a. 05/2019 MI/PCI: ost LCX (rota + cba-->3.0x16 Synergy DES), LAD 90 (3.0x16 Synergy DES).   Charcot-Marie-Tooth disease    walks with cane (fairly controlled)   Diverticulosis    DVT (deep venous thrombosis) (HCC) 2003   after sx   GERD (gastroesophageal reflux disease)    Grief reaction    on fluoxetine   Headache(784.0)    migraine   Hiatal hernia    Hyperlipidemia    Hypertension    OA (ocular albinism) (HCC)    Old myocardial infarction    Osteopenia    PUD (peptic ulcer disease)    gastric, past   Shingles     Stroke (HCC) 8/10   posterior reversible encephalopathy syndrome   Urine incontinence     Medications:  No anticoagulation prior to admission per my chart review. Medication reconciliation is pending  Assessment: 89 y/o F with medical history as above presenting to the ED 5/20 with chest pain. Troponin elevated, concern for ACS. Pharmacy consulted to initiate and manage heparin infusion for ACS.  Baseline CBC overall within normal limits. Baseline aPTT 34s and INR 1.3   Date/time HL  Comments 0521 [phone]  HL 0.16  subthera; 600 un/hr 0521 1224  HL <0.10  SUB-thera; 800 un/hr (line infiltrated around time of draw, infusion moved to other line)  0522 0418  HL 0.18   SUB-therapeutic 0522 1417 HL 0.27  Subtherapeutic @ 1100 units/hr > 1200 un/hr 0522 2346      HL 0.37            Therapeutic X 1   Goal of Therapy:  Heparin level 0.3-0.7 units/ml Monitor platelets by anticoagulation protocol: Yes   Plan:  0522:  HL @ 2346 = 0.37, therapeutic X 1 - Will continue pt on current rate and recheck HL on 5/23 @ 0800.  --Daily CBC per protocol while on IV heparin  Shatori Bertucci D 06/19/2022 12:38 AM

## 2022-06-19 NOTE — Progress Notes (Signed)
[facility]  PROGRESS NOTE  Lydia King ZOX:[id number] DOB: 02/01/1930 DOA: 06/16/2022 PCP: Judy Pimple, MD   Brief HPI:   89 y.o. female with medical history significant of DVT no longer on anticoagulation, CAD, stent placement, stroke, asthma, anemia, hypertension, hyperlipidemia, hiatal hernia, GIB, depression with anxiety, gastric ulcer, diastolic CHF, OSA on CPAP, aortic stenosis, who presented with chest pain.  Troponin was elevated with peak troponin 415   Cardiology consult is obtained, patient also placed on heparin drip. Patient's oxygen requirement continued to rise, currently on 40 L/min HFNC, 37% FiO2.  VQ scan showed intermediate to high probability for pulmonary embolism CTA chest could not be obtained due to the contrast allergy       Assessment/Plan:    Acute hypoxemic respiratory failure -Likely in setting of PE , acute on chronic diastolic CHF -Currently requiring oxygen via high flow nasal cannula -VQ scan showed multiple perfusion defects already in size in both lungs, intermediate to high probability for PE, evaluation limited without ventilation images. -Heparin GTT  -CT chest recommended however not obtained at this time due to history of contrast allergy -Chest x-ray obtained on 5/22 showed multifocal pneumonia, ceftriaxone was switched to vancomycin and cefepime. -Clinically patient has improved -Pulmonology saw patient and did not recommend any catheter directed thrombolysis due to worsening respiratory status and high oxygen requirement.  -Will continue with anticoagulation for now.    NSTEMI -History of CAD, presented with troponin elevation -Currently on heparin drip -Echocardiogram shows severe pulmonary hypertension -Cardiology following  ?  Pulmonary embolism -VQ scan shows intermediate hypermobility for PE -Multiple perfusion defects in both lung fields -Evaluation limited without ventilation images -Unable to obtain CTA chest due  to contrast allergy -Venous duplex of lower extremities was negative for DVT  Acute kidney injury -Resolved -Likely in setting of hypotension  Hypertension -Continue to hold lisinopril, clonidine, Imdur -Continue Coreg -Continue as needed hydralazine  Dysphagia -Patient had choking episode while eating -Swallow evaluation obtained -Currently n.p.o.  History of CVA (cerebrovascular accident) -ASA   HLD (hyperlipidemia): pt could not tolerate statin -f/u FLP   Asthma: -Bronchodilators  Hyponatremia -Likely in setting of dehydration, poor p.o. intake -Follow serum sodium in a.m.   Type 2 diabetes mellitus without complications [facility]):  Recent A1c 7.0.   -Continue insulin glargine and sliding scale insulin.   Elevated lactic acid level:  Likely due to dehydration.   Depression with anxiety -Continue home medications  Medications     aspirin EC  81 mg Oral Daily   baclofen  5 mg Oral Daily   calcium carbonate  400 mg of elemental calcium Oral TID WC   carvedilol  3.125 mg Oral BID WC   famotidine  20 mg Oral QHS   fluconazole  100 mg Oral Daily   FLUoxetine  30 mg Oral Daily   insulin aspart  0-5 Units Subcutaneous QHS   insulin aspart  0-9 Units Subcutaneous TID WC   insulin glargine-yfgn  3 Units Subcutaneous QHS   ipratropium-albuterol  3 mL Nebulization Q6H   lidocaine  1 patch Transdermal Q24H   melatonin  2.5 mg Oral QHS   multivitamin  2 tablet Oral Daily   polyvinyl alcohol  1 drop Both Eyes 5 X Daily     Data Reviewed:   CBG:  Recent Labs  Lab 06/17/22 2119 06/18/22 0747 06/18/22 1213 06/18/22 1759 06/18/22 2128  GLUCAP 242* 217* 191* 179* 156*    SpO2: 99 % O2 Flow Rate (  L/min): 34.7 L/min FiO2 (%): 35 %    Vitals:   06/19/22 0126 06/19/22 0500 06/19/22 0526 06/19/22 0748  BP:   (!) 126/45 (!) 135/51  Pulse:   73 71  Resp:   20 18  Temp:   97.7 F (36.5 C) (!) 96.5 F (35.8 C)  TempSrc:   Oral   SpO2: 100%  98% 99%  Weight:   59.1 kg    Height:          Data Reviewed:  Basic Metabolic Panel: Recent Labs  Lab 06/16/22 1454 06/17/22 0316 06/18/22 0418 06/19/22 0612  NA 130* 130* 128* 129*  K 3.9 4.2 3.7 3.5  CL 96* 98 97* 100  CO2 22 22 23  19*  GLUCOSE 228* 229* 259* 203*  BUN 43* 47* 50* 46*  CREATININE 1.41* 1.29* 1.07* 0.94  CALCIUM 9.6 9.5 9.2 9.5  MG  --   --  2.5*  --     CBC: Recent Labs  Lab 06/16/22 1454 06/17/22 0104 06/18/22 0418 06/19/22 0612  WBC 17.9* 15.8* 9.8 12.6*  HGB 13.2 12.6 11.2* 11.3*  HCT 40.0 37.2 32.9* 33.9*  MCV 93.5 94.2 92.7 92.9  PLT 140* 175 136* 160    LFT Recent Labs  Lab 06/19/22 0612  AST 9*  ALT 22  ALKPHOS 74  BILITOT 1.2  PROT 5.2*  ALBUMIN 2.2*     Antibiotics: Anti-infectives (From admission, onward)    Start     Dose/Rate Route Frequency Ordered Stop   06/20/22 1800  vancomycin (VANCOCIN) IVPB 1000 mg/200 mL premix        1,000 mg 200 mL/hr over 60 Minutes Intravenous Every 48 hours 06/18/22 1715     06/18/22 1800  ceFEPIme (MAXIPIME) 2 g in sodium chloride 0.9 % 100 mL IVPB        2 g 200 mL/hr over 30 Minutes Intravenous Every 24 hours 06/18/22 1711     06/18/22 1800  vancomycin (VANCOREADY) IVPB 1500 mg/300 mL        1,500 mg 150 mL/hr over 120 Minutes Intravenous  Once 06/18/22 1712 06/18/22 2136   06/18/22 1000  fluconazole (DIFLUCAN) tablet 100 mg        100 mg Oral Daily 06/18/22 0926     06/17/22 2200  fluconazole (DIFLUCAN) tablet 100 mg  Status:  Discontinued        100 mg Oral Every 48 hours 06/16/22 2056 06/18/22 0926   06/17/22 2000  cefTRIAXone (ROCEPHIN) 2 g in sodium chloride 0.9 % 100 mL IVPB  Status:  Discontinued        2 g 200 mL/hr over 30 Minutes Intravenous Every 24 hours 06/17/22 1819 06/18/22 1703        DVT prophylaxis: Heparin  Code Status: DNR  Family Communication: Discussed with patient's sister at bedside   CONSULTS cardiology   Subjective    Feels better this morning, still  requiring 35 L/min HFNC, 35% FiO2  Objective    Physical Examination:  Appears in no acute distress S1-S2, regular Lungs bilateral rhonchi auscultated Abdomen is soft, nontender, no organomegaly No edema in the lower extremities   Status is: Inpatient:             Lydia King   [facility] If 7PM-7AM, please contact night-coverage at [email or web address], Office  [phone]   06/19/2022, 7:53 AM  LOS: 3 days

## 2022-06-19 NOTE — Progress Notes (Signed)
Rounding Note    Patient Name: Lydia King Date of Encounter: 06/19/2022  [facility] Cardiologist: Julien Nordmann, MD   Subjective   Family at the bedside, They report that she is mentating better, appears to be breathing better, sat up on the side of the bed ate breakfast for the first time in a week Increasing cough, sounds more productive/loose Remains on high flow nasal cannula oxygen Heparin infusion, antibiotics  Inpatient Medications    Scheduled Meds:  aspirin EC  81 mg Oral Daily   baclofen  5 mg Oral Daily   calcium carbonate  400 mg of elemental calcium Oral TID WC   carvedilol  3.125 mg Oral BID WC   famotidine  20 mg Oral QHS   fluconazole  100 mg Oral Daily   FLUoxetine  30 mg Oral Daily   insulin aspart  0-5 Units Subcutaneous QHS   insulin aspart  0-9 Units Subcutaneous TID WC   insulin glargine-yfgn  3 Units Subcutaneous QHS   ipratropium-albuterol  3 mL Nebulization Q6H   lidocaine  1 patch Transdermal Q24H   melatonin  2.5 mg Oral QHS   multivitamin  2 tablet Oral Daily   polyvinyl alcohol  1 drop Both Eyes 5 X Daily   Continuous Infusions:  ceFEPime (MAXIPIME) IV Stopped (06/18/22 1919)   heparin 1,300 Units/hr (06/19/22 1241)   [START ON 06/20/2022] vancomycin     PRN Meds: acetaminophen, albuterol, alum & mag hydroxide-simeth, dextromethorphan-guaiFENesin, fentaNYL (SUBLIMAZE) injection, hydrALAZINE, nitroGLYCERIN, ondansetron (ZOFRAN) IV, mouth rinse, oxyCODONE-acetaminophen, polyethylene glycol   Vital Signs    Vitals:   06/19/22 0526 06/19/22 0748 06/19/22 0807 06/19/22 1253  BP: (!) 126/45 (!) 135/51  (!) 134/115  Pulse: 73 71  80  Resp: 20 18  18   Temp: 97.7 F (36.5 C) (!) 96.5 F (35.8 C)  97.8 F (36.6 C)  TempSrc: Oral Axillary    SpO2: 98% 99% 100% 100%  Weight:      Height:        Intake/Output Summary (Last 24 hours) at 06/19/2022 1353 Last data filed at 06/19/2022 1200 Gross per 24 hour  Intake 436.08  ml  Output 850 ml  Net -413.92 ml      06/19/2022    5:00 AM 06/18/2022    4:00 AM 06/16/2022    2:45 PM  Last 3 Weights  Weight (lbs) 130 lb 4.7 oz 135 lb 5.8 oz 127 lb 13.9 oz  Weight (kg) 59.1 kg 61.4 kg 58 kg      Telemetry    Normal sinus rhythm- Personally Reviewed  ECG     - Personally Reviewed  Physical Exam   GEN: No acute distress.   Neck:  JVD 10 Cardiac: RRR, no murmurs, rubs, or gallops.  Respiratory: Coarse breath sounds bilaterally GI: Soft, nontender, non-distended  MS: No edema; No deformity. Neuro:  Nonfocal  Psych: Normal affect   Labs    High Sensitivity Troponin:   Recent Labs  Lab 06/16/22 1454 06/16/22 1703 06/16/22 2122 06/17/22 0104 06/17/22 0316  TROPONINIHS 415* 398* 307* 277* 278*     Chemistry Recent Labs  Lab 06/17/22 0316 06/18/22 0418 06/19/22 0612  NA 130* 128* 129*  K 4.2 3.7 3.5  CL 98 97* 100  CO2 22 23 19*  GLUCOSE 229* 259* 203*  BUN 47* 50* 46*  CREATININE 1.29* 1.07* 0.94  CALCIUM 9.5 9.2 9.5  MG  --  2.5*  --   PROT  --   --  5.2*  ALBUMIN  --   --  2.2*  AST  --   --  9*  ALT  --   --  22  ALKPHOS  --   --  74  BILITOT  --   --  1.2  GFRNONAA 40* 50* 58*  ANIONGAP 10 8 10     Lipids  Recent Labs  Lab 06/17/22 0316  CHOL 222*  TRIG 170*  HDL 71  LDLCALC 117*  CHOLHDL 3.1    Hematology Recent Labs  Lab 06/17/22 0104 06/18/22 0418 06/19/22 0612  WBC 15.8* 9.8 12.6*  RBC 3.95 3.55* 3.65*  HGB 12.6 11.2* 11.3*  HCT 37.2 32.9* 33.9*  MCV 94.2 92.7 92.9  MCH 31.9 31.5 31.0  MCHC 33.9 34.0 33.3  RDW 13.2 13.2 13.2  PLT 175 136* 160   Thyroid No results for input(s): "TSH", "FREET4" in the last 168 hours.  BNP Recent Labs  Lab 06/16/22 1434  BNP 1,311.2*    DDimer No results for input(s): "DDIMER" in the last 168 hours.   Radiology    US Venous Img Lower Bilateral (DVT)  Result Date: 06/19/2022 CLINICAL DATA:  Bilateral lower extremity pain, indeterminate duration EXAM: BILATERAL  LOWER EXTREMITY VENOUS DOPPLER ULTRASOUND TECHNIQUE: Gray-scale sonography with graded compression, as well as color Doppler and duplex ultrasound were performed to evaluate the lower extremity deep venous systems from the level of the common femoral vein and including the common femoral, femoral, profunda femoral, popliteal and calf veins including the posterior tibial, peroneal and gastrocnemius veins when visible. The superficial great saphenous vein was also interrogated. Spectral Doppler was utilized to evaluate flow at rest and with distal augmentation maneuvers in the common femoral, femoral and popliteal veins. COMPARISON:  None Available. FINDINGS: RIGHT LOWER EXTREMITY Common Femoral Vein: No evidence of thrombus. Normal compressibility, respiratory phasicity and response to augmentation. Saphenofemoral Junction: No evidence of thrombus. Normal compressibility and flow on color Doppler imaging. Profunda Femoral Vein: No evidence of thrombus. Normal compressibility and flow on color Doppler imaging. Femoral Vein: No evidence of thrombus. Normal compressibility, respiratory phasicity and response to augmentation. Popliteal Vein: No evidence of thrombus. Normal compressibility, respiratory phasicity and response to augmentation. Calf Veins: No evidence of thrombus. Normal compressibility and flow on color Doppler imaging. Superficial Great Saphenous Vein: No evidence of thrombus. Normal compressibility. Venous Reflux:  None. Other Findings:  None. LEFT LOWER EXTREMITY Common Femoral Vein: No evidence of thrombus. Normal compressibility, respiratory phasicity and response to augmentation. Saphenofemoral Junction: No evidence of thrombus. Normal compressibility and flow on color Doppler imaging. Profunda Femoral Vein: No evidence of thrombus. Normal compressibility and flow on color Doppler imaging. Femoral Vein: No evidence of thrombus. Normal compressibility, respiratory phasicity and response to  augmentation. Popliteal Vein: No evidence of thrombus. Normal compressibility, respiratory phasicity and response to augmentation. Calf Veins: No evidence of thrombus. Normal compressibility and flow on color Doppler imaging. Superficial Great Saphenous Vein: No evidence of thrombus. Normal compressibility. Venous Reflux:  None. Other Findings:  None. IMPRESSION: No evidence of deep venous thrombosis in either lower extremity. Electronically Signed   By: Malachy Moan M.D.   On: 06/19/2022 12:54   DG Chest Port 1 View  Result Date: 06/18/2022 CLINICAL DATA:  Pneumonia EXAM: PORTABLE CHEST 1 VIEW COMPARISON:  06/16/2022 FINDINGS: Single frontal view of the chest demonstrates stable enlargement of the cardiac silhouette. Since the prior exam, dense areas of consolidation of developed in the left upper lobe compatible with pneumonia. There is more  patchy airspace disease elsewhere within the lungs. Continued hiatal hernia. No effusion or pneumothorax. No acute bony abnormalities. IMPRESSION: 1. Interval development of airspace disease, with dense left upper lobe consolidation most evident. Findings are consistent with multifocal pneumonia, though underlying sequela of pulmonary infarct cannot be excluded given recent V/Q scan findings. Electronically Signed   By: Sharlet Salina M.D.   On: 06/18/2022 15:08   NM Pulmonary Perfusion  Result Date: 06/17/2022 CLINICAL DATA:  Shortness of breath, high clinical suspicion for PE EXAM: NUCLEAR MEDICINE PERFUSION LUNG SCAN TECHNIQUE: Perfusion images were obtained in multiple projections after intravenous injection of radiopharmaceutical. Ventilation scans intentionally deferred if perfusion scan and chest x-ray adequate for interpretation during COVID 19 epidemic. RADIOPHARMACEUTICALS:  3.99 mCi Tc-2m MAA IV COMPARISON:  Chest radiograph done on 06/16/2022 FINDINGS: There are multiple perfusion defects of varying sizes in both lungs. Some of the defects appear to  be which shaped. In the chest radiograph done on 06/16/2022, no focal pulmonary infiltrates were noted. Evaluation is limited without ventilation images. IMPRESSION: There are multiple perfusion defects all vein sizes in both lungs. Some of the defects show wedge shape. There are no matching infiltrates in the lung fields in the chest radiograph done on 06/16/2022. Findings suggest intermediate to high probability for PE. Evaluation is limited without ventilation images. If clinically feasible, CT pulmonary angiogram should be considered. Electronically Signed   By: Ernie Avena M.D.   On: 06/17/2022 14:57    Cardiac Studies   Echo 1. Left ventricular ejection fraction, by estimation, is 65 to 70%. The  left ventricle has normal function. Left ventricular endocardial border  not optimally defined to evaluate regional wall motion. Left ventricular  diastolic parameters are consistent  with Grade II diastolic dysfunction (pseudonormalization). Elevated left  atrial pressure.   2. Right ventricular systolic function is normal. The right ventricular  size is normal. There is severely elevated pulmonary artery systolic  pressure. The estimated right ventricular systolic pressure is 63.1 mmHg.   3. Left atrial size was moderately dilated.   4. The mitral valve is degenerative. Trivial mitral valve regurgitation.  No evidence of mitral stenosis. Moderate mitral annular calcification.   5. Tricuspid valve regurgitation is moderate to severe.   6. The aortic valve has an indeterminant number of cusps. There is severe  calcifcation of the aortic valve. There is severe thickening of the aortic  valve. Aortic valve regurgitation is not visualized. There is at least  mild to moderate aortic stenosis,   though degree of stenosis appears more severe by visual estimation. Poor  Doppler envelopes likely underestimate the aortic valve gradient.   7. Pulmonic valve regurgitation not well assessed.   8.  The inferior vena cava is normal in size with <50% respiratory  variability, suggesting right atrial pressure of 8 mmHg.   Patient Profile     89 y.o. female with past medical history of coronary disease status post PCI to LAD and circumflex May 2021, moderate aortic stenosis, HFpEF, hypertension, hyperlipidemia, type 2 diabetes, stroke, hiatal hernia, anemia, asthma, and history of DVT, who is being seen and evaluated for chest pain and elevated high-sensitivity troponin in the setting of presumed PE, pneumonia, respiratory distress.   Assessment & Plan    Acute respiratory distress/pneumonia On broad-spectrum antibiotics, course cough Mentating better, eating better this morning X-ray concerning for multifocal pneumonia On ABX  Intermediate to high probability PE Complicated diagnosis in the setting of underlying pneumonia No lower extremity DVT - consider  chest CTA before committing to long-term anticoagulation Given chest pain, would be prudent to rule out ischemic lung Currently on heparin infusion Reports having contrast allergy but could undergo premedication with Solu-Medrol, Benadryl  Pulmonary hypertension/elevated BNP Etiology unclear, in the setting of pneumonia, unable to exclude PE With renal function improving, would continue IV Lasix 20 mg daily  Large hiatal hernia High risk for surgery  Chest pain Atypical in nature, worse with coughing Unable to exclude musculoskeletal versus PE complication   Total encounter time more than 50 minutes  Greater than 50% was spent in counseling and coordination of care with the patient   For questions or updates, please contact [facility] Please consult [email or web address] for contact info under        Signed, Julien Nordmann, MD  06/19/2022, 1:53 PM

## 2022-06-19 NOTE — Progress Notes (Signed)
SLP Contact Note  Patient Details Name: Lydia King MRN: [id number] DOB: Jul 13, 1930   Briefly ran into pt's daughter in hallway. [name] endorsed pt did well with breakfast including scrambled eggs and grits with coffee. Daughter denies pt with any overt s/sx pharyngeal dysphagia. Daughter expressed appreciated for pt being able to resume a regular diet. Reviewed diet rec's, safe swallowing strategies/aspiration precautions with pt's daughter. Will f/u as appropriate for diet tolerance.     Clyde Canterbury, M.S., CCC-SLP Speech-Language Pathologist [facility] [phone] Arnette Felts)  Woodroe Chen 06/19/2022, 12:50 PM

## 2022-06-19 NOTE — Care Management Important Message (Signed)
Important Message  Patient Details  Name: Lydia King MRN: [id number] Date of Birth: 04/20/1930   Medicare Important Message Given:  Yes     Johnell Comings 06/19/2022, 1:27 PM

## 2022-06-19 NOTE — Progress Notes (Addendum)
Speech Language Pathology Treatment: Dysphagia  Patient Details Name: Lydia King MRN: [id number] DOB: 04-23-1930 Today's Date: 06/19/2022 Time: 4098-1191 SLP Time Calculation (min) (ACUTE ONLY): 20 min  Assessment / Plan / Recommendation Clinical Impression  Pt seen for clinical swallowing re-evaluation. Multiple family members in and out of room during session. Pt on 35L/min O2 via HFNC. Pleasant and conversant throughout. Pt given trials of solid, puree, and thin liquids. Pt able to feed self. Pt presents with s/sx mild oral dysphagia c/b prolonged mastication of solids which appeared secondary to dental status and respiratory status. Pharyngeal swallow appeared [facility] per clincial assessment. No overt or subtle s/sx pharyngeal dysphagia across trials. Pt is at increased risk for aspiration/aspiration PNA given respiratory status and waxing/waning LOA. Due to the above, recommend cautious initiation of a mech soft diet with thin liquids with safe swallowing strategies/aspiration precautions and reflux precautions as outlined below. SLP to f/u per POC for diet tolerance and consideration for MBSS. Pt, pt's family, and RN made aware of results, recommendations, and SLP POC. Additional education provided re: relationship between swallowing and breathing with pt and family. Pt and family verbalized understanding agreement. Reinforcement of content likely needed.   HPI HPI: Lydia King is a 89 y.o. female who presented to [facility] ED from [facility] on 06/16/2022 with chest pain for the last 3 days, persistent course, mainly in the left side of her chest, described as both pressure and sharp, and associated with nausea and belching as well as radiation to her back.  This feels similar to prior chest pain caused by her hiatal hernia.  Initial lab workup reveals elevated troponin to 415.  Labs are also significant for elevated WBC count.  EKG shows nonspecific T wave abnormalities but no ischemic  findings.  Pt with history of DVT no longer on anticoagulation, CAD, stroke, asthma, anemia, hypertension, hyperlipidemia, and hiatal hernia. Pt evaluated by cardiology with concern noted for potential aspiration pneumonia given size of hiatal hernia. NM Pulmonary Perfusion, 5/21, "There are multiple perfusion defects all vein sizes in both lungs. Some of the defects show wedge shape. There are no matching infiltrates in the lung fields in the chest radiograph done on 06/16/2022. Findings suggest intermediate to high probability for PE. Evaluation is limited without ventilation images. If clinically feasible, CT pulmonary angiogram should be considered." DG Chest, 5/22, "1. Interval development of airspace disease, with dense left upper lobe consolidation most evident. Findings are consistent with multifocal pneumonia, though underlying sequela of pulmonary infarct cannot be excluded given recent V/Q scan findings."      SLP Plan  Continue with current plan of care      Recommendations for follow up therapy are one component of a multi-disciplinary discharge planning process, led by the attending physician.  Recommendations may be updated based on patient status, additional functional criteria and insurance authorization.    Recommendations  Diet recommendations: Dysphagia 3 (mechanical soft);Thin liquid Medication Administration: Crushed with puree Supervision: Patient able to self feed Compensations: Minimize environmental distractions;Slow rate;Small sips/bites (rest breaks PRN; reflux precautions) Postural Changes and/or Swallow Maneuvers: Out of bed for meals;Seated upright 90 degrees;Upright 30-60 min after meal                       (TBD) Dysphagia, unspecified (R13.10)     Continue with current plan of care    Clyde Canterbury, M.S., CCC-SLP Speech-Language Pathologist [facility] [phone] (ASCOM) Demetrius Charity Victorino Dike  Cassandria Anger  06/19/2022, 9:56 AM

## 2022-06-19 NOTE — Consult Note (Signed)
ANTICOAGULATION CONSULT NOTE  Pharmacy Consult for IV Heparin Indication: chest pain/ACS  Patient Measurements: Height: 4\' 10"  (147.3 cm) Weight: 59.1 kg (130 lb 4.7 oz) IBW/kg (Calculated) : 40.9 Heparin Dosing Weight: 53.2 kg  Labs: Recent Labs     0000 06/16/22 2122 06/17/22 0104 06/17/22 0316 06/17/22 1224 06/18/22 0418 06/18/22 1417 06/18/22 2346 06/19/22 0612 06/19/22 0902 06/19/22 1924  HGB   < >  --  12.6  --   --  11.2*  --   --  11.3*  --   --   HCT  --   --  37.2  --   --  32.9*  --   --  33.9*  --   --   PLT  --   --  175  --   --  136*  --   --  160  --   --   HEPARINUNFRC  --   --   --  0.16*   < > 0.18*   < > 0.37  --  0.22* 0.40  CREATININE  --   --   --  1.29*  --  1.07*  --   --  0.94  --   --   TROPONINIHS  --  307* 277* 278*  --   --   --   --   --   --   --    < > = values in this interval not displayed.    Estimated Creatinine Clearance: 30.9 mL/min (by C-G formula based on SCr of 0.94 mg/dL).  Medical History: Past Medical History:  Diagnosis Date   (HFpEF) heart failure with preserved ejection fraction (HCC)    a. 06/2019 Echo ([facility]): EF >55%, mild conc LVH. Mild-mod AS, mild MR/TR.   Anemia    in past (from blood loss at hiatal hernia)   Asthma    CAD (coronary artery disease)    a. 05/2019 MI/PCI: ost LCX (rota + cba-->3.0x16 Synergy DES), LAD 90 (3.0x16 Synergy DES).   Charcot-Marie-Tooth disease    walks with cane (fairly controlled)   Diverticulosis    DVT (deep venous thrombosis) (HCC) 2003   after sx   GERD (gastroesophageal reflux disease)    Grief reaction    on fluoxetine   Headache(784.0)    migraine   Hiatal hernia    Hyperlipidemia    Hypertension    OA (ocular albinism) (HCC)    Old myocardial infarction    Osteopenia    PUD (peptic ulcer disease)    gastric, past   Shingles    Stroke (HCC) 8/10   posterior reversible encephalopathy syndrome   Urine incontinence     Medications:  No anticoagulation prior to  admission per my chart review. Medication reconciliation is pending  Assessment: 89 y/o F with medical history as above presenting to the ED 5/20 with chest pain. Troponin elevated, concern for ACS. Pharmacy consulted to initiate and manage heparin infusion for ACS.  Baseline CBC overall within normal limits. Baseline aPTT 34s and INR 1.3   Date/time HL  Comments 0521 0316 HL 0.16  subthera; 600 un/hr 0521 1224 HL <0.10  SUB-thera; 800 un/hr (line infiltrated around time of draw, infusion moved to other line)  0522 0418 HL 0.18   SUB-therapeutic 0522 1417 HL 0.27  Subtherapeutic @ 1100 units/hr > 1200 un/hr 0522 2346 HL 0.37            Therapeutic X 1  0523 0902 HL 0.22  Subtherapeutic at 1200 >  1300 0523 1924 HL 0.40  Therapeutic x 1  Goal of Therapy:  Heparin level 0.3-0.7 units/ml Monitor platelets by anticoagulation protocol: Yes   Plan:  Continue heparin infusion at 1300 units/hr Check anti-Xa level in 8 hours and daily once consecutively therapeutic. Continue to monitor H&H and platelets daily while on heparin gtt.  Will M. Dareen Piano, PharmD PGY-1 Pharmacy Resident 06/19/2022 7:48 PM

## 2022-06-20 DIAGNOSIS — N179 Acute kidney failure, unspecified: Secondary | ICD-10-CM | POA: Diagnosis not present

## 2022-06-20 DIAGNOSIS — I5032 Chronic diastolic (congestive) heart failure: Secondary | ICD-10-CM | POA: Diagnosis not present

## 2022-06-20 DIAGNOSIS — I25118 Atherosclerotic heart disease of native coronary artery with other forms of angina pectoris: Secondary | ICD-10-CM | POA: Diagnosis not present

## 2022-06-20 DIAGNOSIS — J9601 Acute respiratory failure with hypoxia: Secondary | ICD-10-CM | POA: Diagnosis not present

## 2022-06-20 DIAGNOSIS — I272 Pulmonary hypertension, unspecified: Secondary | ICD-10-CM | POA: Diagnosis not present

## 2022-06-20 DIAGNOSIS — R7989 Other specified abnormal findings of blood chemistry: Secondary | ICD-10-CM | POA: Diagnosis not present

## 2022-06-20 LAB — COMPREHENSIVE METABOLIC PANEL
ALT: 17 U/L (ref 0–44)
AST: 10 U/L — ABNORMAL LOW (ref 15–41)
Albumin: 2.1 g/dL — ABNORMAL LOW (ref 3.5–5.0)
Alkaline Phosphatase: 82 U/L (ref 38–126)
Anion gap: 9 (ref 5–15)
BUN: 28 mg/dL — ABNORMAL HIGH (ref 8–23)
CO2: 21 mmol/L — ABNORMAL LOW (ref 22–32)
Calcium: 8.9 mg/dL (ref 8.9–10.3)
Chloride: 97 mmol/L — ABNORMAL LOW (ref 98–111)
Creatinine, Ser: 0.69 mg/dL (ref 0.44–1.00)
GFR, Estimated: >60 mL/min (ref >60)
Glucose, Bld: 220 mg/dL — ABNORMAL HIGH (ref 70–99)
Potassium: 3.2 mmol/L — ABNORMAL LOW (ref 3.5–5.1)
Sodium: 127 mmol/L — ABNORMAL LOW (ref 135–145)
Total Bilirubin: 1.1 mg/dL (ref 0.3–1.2)
Total Protein: 5.4 g/dL — ABNORMAL LOW (ref 6.5–8.1)

## 2022-06-20 LAB — CBC
HCT: 33.7 % — ABNORMAL LOW (ref 36.0–46.0)
Hemoglobin: 11.5 g/dL — ABNORMAL LOW (ref 12.0–15.0)
MCH: 30.9 pg (ref 26.0–34.0)
MCHC: 34.1 g/dL (ref 30.0–36.0)
MCV: 90.6 fL (ref 80.0–100.0)
Platelets: 174 10*3/uL (ref 150–400)
RBC: 3.72 MIL/uL — ABNORMAL LOW (ref 3.87–5.11)
RDW: 12.9 % (ref 11.5–15.5)
WBC: 11.3 10*3/uL — ABNORMAL HIGH (ref 4.0–10.5)
nRBC: 0 % (ref 0.0–0.2)

## 2022-06-20 LAB — GLUCOSE, CAPILLARY
Glucose-Capillary: 219 mg/dL — ABNORMAL HIGH (ref 70–99)
Glucose-Capillary: 262 mg/dL — ABNORMAL HIGH (ref 70–99)
Glucose-Capillary: 243 mg/dL — ABNORMAL HIGH (ref 70–99)
Glucose-Capillary: 186 mg/dL — ABNORMAL HIGH (ref 70–99)

## 2022-06-20 LAB — CULTURE, BLOOD (ROUTINE X 2)

## 2022-06-20 LAB — HEPARIN LEVEL (UNFRACTIONATED): Heparin Unfractionated: 0.25 IU/mL — ABNORMAL LOW (ref 0.30–0.70)

## 2022-06-20 MED ORDER — APIXABAN 5 MG PO TABS: 5.0000 mg | ORAL_TABLET | Freq: Two times a day (BID) | ORAL | Status: DC

## 2022-06-20 MED ORDER — SODIUM CHLORIDE 0.9 % IV SOLN
2.0000 g | Freq: Two times a day (BID) | INTRAVENOUS | Status: DC
  Administered 2022-06-20 – 2022-06-23 (×8): 2 g via INTRAVENOUS
  Filled 2022-06-20 (×9): qty 12.5

## 2022-06-20 MED ORDER — FUROSEMIDE 10 MG/ML IJ SOLN
20.0000 mg | Freq: Every day | INTRAMUSCULAR | Status: DC
  Administered 2022-06-20 – 2022-06-21 (×2): 20 mg via INTRAVENOUS
  Filled 2022-06-20 (×3): qty 2

## 2022-06-20 MED ORDER — HEPARIN BOLUS VIA INFUSION
800.0000 [IU] | Freq: Once | INTRAVENOUS | Status: AC
  Administered 2022-06-20: 800 [IU] via INTRAVENOUS
  Filled 2022-06-20: qty 800

## 2022-06-20 MED ORDER — VANCOMYCIN HCL IN DEXTROSE 1-5 GM/200ML-% IV SOLN
1000.0000 mg | INTRAVENOUS | Status: DC
  Administered 2022-06-20: 1000 mg via INTRAVENOUS
  Filled 2022-06-20: qty 200

## 2022-06-20 MED ORDER — POTASSIUM CHLORIDE 20 MEQ PO PACK
40.0000 meq | PACK | Freq: Once | ORAL | Status: AC
  Administered 2022-06-20: 40 meq via ORAL
  Filled 2022-06-20: qty 2

## 2022-06-20 MED ORDER — FUROSEMIDE 10 MG/ML IJ SOLN: 20.0000 mg | Freq: Once | INTRAMUSCULAR | Status: DC

## 2022-06-20 MED ORDER — APIXABAN 5 MG PO TABS
10.0000 mg | ORAL_TABLET | Freq: Two times a day (BID) | ORAL | Status: DC
  Administered 2022-06-20 – 2022-06-24 (×9): 10 mg via ORAL
  Filled 2022-06-20 (×9): qty 2

## 2022-06-20 MED ORDER — POTASSIUM CHLORIDE CRYS ER 20 MEQ PO TBCR: 40.0000 meq | EXTENDED_RELEASE_TABLET | Freq: Once | ORAL | Status: DC

## 2022-06-20 NOTE — Consult Note (Signed)
Pharmacy Antibiotic Note  Lydia King is a 89 y.o. female with PMH including HFpEF, hx DVT, Hx CVA, CAD, asthma, OSA, GERD admitted on 06/16/2022 with  acute respiratory failure in setting of suspected pulmonary embolism and pneumonia . Pharmacy has been consulted for vancomycin and cefepime dosing.  Patient with apparent AKI on admission now resolved. Dr Sharl Ma wants to continue Vancomycin therapy despite not detected MRSA PCR.   Plan: Day#4 of Abx, 1 dose of vancomycin received. Change Cefepime 2 g IV q 24h to Cefepime 2 gm q 12 hours Changer Vancomycin 1 g IV q 48h yo 1000 mg IV q 36 hrs Expected AUC: 516.1 Css max:35.1 Css min: 12.3 Monitor renal function and cultures to adjust therapy as needed Plan to draw level at steady state or as clinically indicated  Height: 4\' 10"  (147.3 cm) Weight: 59.9 kg (132 lb 0.9 oz) IBW/kg (Calculated) : 40.9  Temp (24hrs), Avg:98.7 F (37.1 C), Min:97.8 F (36.6 C), Max:99.5 F (37.5 C)  Recent Labs  Lab 06/16/22 1454 06/16/22 1641 06/16/22 1841 06/17/22 0104 06/17/22 0316 06/17/22 0732 06/18/22 0418 06/19/22 0612 06/20/22 0410 06/20/22 0820  WBC 17.9*  --   --  15.8*  --   --  9.8 12.6* 11.3*  --   CREATININE 1.41*  --   --   --  1.29*  --  1.07* 0.94  --  0.69  LATICACIDVEN  --  2.6* 2.6*  --   --  1.3  --   --   --   --      Estimated Creatinine Clearance: 36.5 mL/min (by C-G formula based on SCr of 0.69 mg/dL).    Allergies  Allergen Reactions   Morphine And Codeine Anaphylaxis   Other Shortness Of Breath and Other (See Comments)   Shrimp [Shellfish Allergy] Anaphylaxis    Anaphylaxis-swelling of throat/sob     Amlodipine    Amlodipine Besylate     REACTION: edema   Atorvastatin     REACTION: pain- could not walk   Ciprofloxacin Other (See Comments)    REACTION: unkown Trouble breathing   Fentanyl    Influenza Vaccines Other (See Comments)    Arm swelling, redness, chills, fever   Iodinated Contrast Media     Naproxen Other (See Comments)    Throat swelling   Nitrofurantoin    Omeprazole     REACTION: not effective   Pantoprazole Sodium     REACTION: does not work well   Pantoprazole Sodium    Penicillin G Hives    ITCHY RASH   Penicillins     REACTION: itchy rash   Simvastatin     REACTION: leg pain   Sulfa Antibiotics    Sulfonamide Derivatives     REACTION: itchy rash   Codeine Rash    REACTION: itchy rash ITCHY RASH, also breathing trouble   Prednisone Other (See Comments)    REACTION: RASH.// SHORTNESS OF BREATH Prior possible flushing reaction in face, but tolerated it in 2020 and 2021 up to 20mg     Antimicrobials this admission: Ceftriaxone 5/21 x 1 Cefepime 5/22 >>  Vancomycin 5/22 >>   Dose adjustments this admission:N/A  Microbiology results: 5/20 BCx: 1/2 sets Staphylococcus epidermidis 5/22 MRSA PCR: Not detected  Thank you for allowing pharmacy to be a part of this patient's care.  Hershey Knauer Rodriguez-Guzman PharmD, BCPS 06/20/2022 9:52 AM

## 2022-06-20 NOTE — Consult Note (Signed)
ANTICOAGULATION CONSULT NOTE  Pharmacy Consult for IV Heparin Indication: chest pain/ACS  Patient Measurements: Height: 4\' 10"  (147.3 cm) Weight: 59.1 kg (130 lb 4.7 oz) IBW/kg (Calculated) : 40.9 Heparin Dosing Weight: 53.2 kg  Labs: Recent Labs    06/18/22 0418 06/18/22 1417 06/19/22 0612 06/19/22 0902 06/19/22 1924 06/20/22 0410  HGB 11.2*  --  11.3*  --   --  11.5*  HCT 32.9*  --  33.9*  --   --  33.7*  PLT 136*  --  160  --   --  174  HEPARINUNFRC 0.18*   < >  --  0.22* 0.40 0.25*  CREATININE 1.07*  --  0.94  --   --   --    < > = values in this interval not displayed.    Estimated Creatinine Clearance: 30.9 mL/min (by C-G formula based on SCr of 0.94 mg/dL).  Medical History: Past Medical History:  Diagnosis Date   (HFpEF) heart failure with preserved ejection fraction (HCC)    a. 06/2019 Echo ([facility]): EF >55%, mild conc LVH. Mild-mod AS, mild MR/TR.   Anemia    in past (from blood loss at hiatal hernia)   Asthma    CAD (coronary artery disease)    a. 05/2019 MI/PCI: ost LCX (rota + cba-->3.0x16 Synergy DES), LAD 90 (3.0x16 Synergy DES).   Charcot-Marie-Tooth disease    walks with cane (fairly controlled)   Diverticulosis    DVT (deep venous thrombosis) (HCC) 2003   after sx   GERD (gastroesophageal reflux disease)    Grief reaction    on fluoxetine   Headache(784.0)    migraine   Hiatal hernia    Hyperlipidemia    Hypertension    OA (ocular albinism) (HCC)    Old myocardial infarction    Osteopenia    PUD (peptic ulcer disease)    gastric, past   Shingles    Stroke (HCC) 8/10   posterior reversible encephalopathy syndrome   Urine incontinence     Medications:  No anticoagulation prior to admission per my chart review. Medication reconciliation is pending  Assessment: 89 y/o F with medical history as above presenting to the ED 5/20 with chest pain. Troponin elevated, concern for ACS. Pharmacy consulted to initiate and manage heparin infusion for  ACS.  Baseline CBC overall within normal limits. Baseline aPTT 34s and INR 1.3   Date/time HL  Comments 0521 0316 HL 0.16  subthera; 600 un/hr 0521 1224 HL <0.10  SUB-thera; 800 un/hr (line infiltrated around time of draw, infusion moved to other line)  0522 0418 HL 0.18   SUB-therapeutic 0522 1417 HL 0.27  Subtherapeutic @ 1100 units/hr > 1200 un/hr 0522 2346 HL 0.37            Therapeutic X 1  0523 0902 HL 0.22  Subtherapeutic at 1200 > 1300 0523 1924 HL 0.40  Therapeutic x 1 0524 0410      HL 0.25             SUBtherapeutic  Goal of Therapy:  Heparin level 0.3-0.7 units/ml Monitor platelets by anticoagulation protocol: Yes   Plan:  5/24:  HL @ 0410 = 0.25, SUBtherapeutic - Will order heparin 800 units IV X 1 bolus and increase drip rate to 1400 units/hr.  - Will recheck HL 8 hrs after rate change  Continue to monitor H&H and platelets daily while on heparin gtt.  Clancey Welton D 06/20/2022 5:32 AM

## 2022-06-20 NOTE — Progress Notes (Addendum)
Rounding Note    Patient Name: Lydia King Date of Encounter: 06/20/2022  [facility] Cardiologist: Julien Nordmann, MD   Subjective   Family at the bedside, Did not sleep well last night, little bit weaker today Breathing continues to improve, cough with slow improvement, minimal sputum production On nasal cannula oxygen, off high flow nasal cannula oxygen Heparin infusion, antibiotics  Inpatient Medications    Scheduled Meds:  apixaban  10 mg Oral BID   Followed by   Melene Muller ON 06/27/2022] apixaban  5 mg Oral BID   aspirin EC  81 mg Oral Daily   baclofen  5 mg Oral Daily   calcium carbonate  400 mg of elemental calcium Oral TID WC   carvedilol  3.125 mg Oral BID WC   famotidine  20 mg Oral QHS   fluconazole  100 mg Oral Daily   FLUoxetine  30 mg Oral Daily   furosemide  20 mg Intravenous Daily   furosemide  20 mg Intravenous Once   insulin aspart  0-5 Units Subcutaneous QHS   insulin aspart  0-9 Units Subcutaneous TID WC   insulin glargine-yfgn  3 Units Subcutaneous QHS   ipratropium-albuterol  3 mL Nebulization Q6H   lidocaine  1 patch Transdermal Q24H   melatonin  2.5 mg Oral QHS   multivitamin  2 tablet Oral Daily   polyvinyl alcohol  1 drop Both Eyes 5 X Daily   Continuous Infusions:  ceFEPime (MAXIPIME) IV 2 g (06/20/22 1020)   vancomycin 1,000 mg (06/20/22 1144)   PRN Meds: acetaminophen, albuterol, alum & mag hydroxide-simeth, dextromethorphan-guaiFENesin, fentaNYL (SUBLIMAZE) injection, hydrALAZINE, nitroGLYCERIN, ondansetron (ZOFRAN) IV, mouth rinse, oxyCODONE-acetaminophen, polyethylene glycol   Vital Signs    Vitals:   06/20/22 0236 06/20/22 0551 06/20/22 0905 06/20/22 1240  BP:  (!) 151/63 (!) 155/69 123/61  Pulse:  81 80 74  Resp:  18 17 18   Temp:  99.5 F (37.5 C) 98.6 F (37 C) 98.2 F (36.8 C)  TempSrc:      SpO2: 99% 99% 100% 100%  Weight:  59.9 kg    Height:        Intake/Output Summary (Last 24 hours) at 06/20/2022  1423 Last data filed at 06/20/2022 1353 Gross per 24 hour  Intake 1129.92 ml  Output 700 ml  Net 429.92 ml      06/20/2022    5:51 AM 06/19/2022    5:00 AM 06/18/2022    4:00 AM  Last 3 Weights  Weight (lbs) 132 lb 0.9 oz 130 lb 4.7 oz 135 lb 5.8 oz  Weight (kg) 59.9 kg 59.1 kg 61.4 kg      Telemetry    Normal sinus rhythm- Personally Reviewed  ECG     - Personally Reviewed  Physical Exam   Constitutional:  oriented to person, place, and time. No distress.  HENT:  Head: Grossly normal Eyes:  no discharge. No scleral icterus.  Neck:  JVD 10, no carotid bruits  Cardiovascular: Regular rate and rhythm, no murmurs appreciated Pulmonary/Chest: Coarse breath sounds bilaterally Abdominal: Soft.  no distension.  no tenderness.  Musculoskeletal: Normal range of motion Neurological:  normal muscle tone. Coordination normal. No atrophy Skin: Skin warm and dry Psychiatric: normal affect, pleasant   Labs    High Sensitivity Troponin:   Recent Labs  Lab 06/16/22 1454 06/16/22 1703 06/16/22 2122 06/17/22 0104 06/17/22 0316  TROPONINIHS 415* 398* 307* 277* 278*     Chemistry Recent Labs  Lab 06/18/22  1610 06/19/22 0612 06/20/22 0820  NA 128* 129* 127*  K 3.7 3.5 3.2*  CL 97* 100 97*  CO2 23 19* 21*  GLUCOSE 259* 203* 220*  BUN 50* 46* 28*  CREATININE 1.07* 0.94 0.69  CALCIUM 9.2 9.5 8.9  MG 2.5*  --   --   PROT  --  5.2* 5.4*  ALBUMIN  --  2.2* 2.1*  AST  --  9* 10*  ALT  --  22 17  ALKPHOS  --  74 82  BILITOT  --  1.2 1.1  GFRNONAA 50* 58* >60  ANIONGAP 8 10 9     Lipids  Recent Labs  Lab 06/17/22 0316  CHOL 222*  TRIG 170*  HDL 71  LDLCALC 117*  CHOLHDL 3.1    Hematology Recent Labs  Lab 06/18/22 0418 06/19/22 0612 06/20/22 0410  WBC 9.8 12.6* 11.3*  RBC 3.55* 3.65* 3.72*  HGB 11.2* 11.3* 11.5*  HCT 32.9* 33.9* 33.7*  MCV 92.7 92.9 90.6  MCH 31.5 31.0 30.9  MCHC 34.0 33.3 34.1  RDW 13.2 13.2 12.9  PLT 136* 160 174   Thyroid No  results for input(s): "TSH", "FREET4" in the last 168 hours.  BNP Recent Labs  Lab 06/16/22 1434  BNP 1,311.2*    DDimer No results for input(s): "DDIMER" in the last 168 hours.   Radiology    US Venous Img Lower Bilateral (DVT)  Result Date: 06/19/2022 CLINICAL DATA:  Bilateral lower extremity pain, indeterminate duration EXAM: BILATERAL LOWER EXTREMITY VENOUS DOPPLER ULTRASOUND TECHNIQUE: Gray-scale sonography with graded compression, as well as color Doppler and duplex ultrasound were performed to evaluate the lower extremity deep venous systems from the level of the common femoral vein and including the common femoral, femoral, profunda femoral, popliteal and calf veins including the posterior tibial, peroneal and gastrocnemius veins when visible. The superficial great saphenous vein was also interrogated. Spectral Doppler was utilized to evaluate flow at rest and with distal augmentation maneuvers in the common femoral, femoral and popliteal veins. COMPARISON:  None Available. FINDINGS: RIGHT LOWER EXTREMITY Common Femoral Vein: No evidence of thrombus. Normal compressibility, respiratory phasicity and response to augmentation. Saphenofemoral Junction: No evidence of thrombus. Normal compressibility and flow on color Doppler imaging. Profunda Femoral Vein: No evidence of thrombus. Normal compressibility and flow on color Doppler imaging. Femoral Vein: No evidence of thrombus. Normal compressibility, respiratory phasicity and response to augmentation. Popliteal Vein: No evidence of thrombus. Normal compressibility, respiratory phasicity and response to augmentation. Calf Veins: No evidence of thrombus. Normal compressibility and flow on color Doppler imaging. Superficial Great Saphenous Vein: No evidence of thrombus. Normal compressibility. Venous Reflux:  None. Other Findings:  None. LEFT LOWER EXTREMITY Common Femoral Vein: No evidence of thrombus. Normal compressibility, respiratory phasicity and  response to augmentation. Saphenofemoral Junction: No evidence of thrombus. Normal compressibility and flow on color Doppler imaging. Profunda Femoral Vein: No evidence of thrombus. Normal compressibility and flow on color Doppler imaging. Femoral Vein: No evidence of thrombus. Normal compressibility, respiratory phasicity and response to augmentation. Popliteal Vein: No evidence of thrombus. Normal compressibility, respiratory phasicity and response to augmentation. Calf Veins: No evidence of thrombus. Normal compressibility and flow on color Doppler imaging. Superficial Great Saphenous Vein: No evidence of thrombus. Normal compressibility. Venous Reflux:  None. Other Findings:  None. IMPRESSION: No evidence of deep venous thrombosis in either lower extremity. Electronically Signed   By: Malachy Moan M.D.   On: 06/19/2022 12:54    Cardiac Studies   Echo  1. Left ventricular ejection fraction, by estimation, is 65 to 70%. The  left ventricle has normal function. Left ventricular endocardial border  not optimally defined to evaluate regional wall motion. Left ventricular  diastolic parameters are consistent  with Grade II diastolic dysfunction (pseudonormalization). Elevated left  atrial pressure.   2. Right ventricular systolic function is normal. The right ventricular  size is normal. There is severely elevated pulmonary artery systolic  pressure. The estimated right ventricular systolic pressure is 63.1 mmHg.   3. Left atrial size was moderately dilated.   4. The mitral valve is degenerative. Trivial mitral valve regurgitation.  No evidence of mitral stenosis. Moderate mitral annular calcification.   5. Tricuspid valve regurgitation is moderate to severe.   6. The aortic valve has an indeterminant number of cusps. There is severe  calcifcation of the aortic valve. There is severe thickening of the aortic  valve. Aortic valve regurgitation is not visualized. There is at least  mild to  moderate aortic stenosis,   though degree of stenosis appears more severe by visual estimation. Poor  Doppler envelopes likely underestimate the aortic valve gradient.   7. Pulmonic valve regurgitation not well assessed.   8. The inferior vena cava is normal in size with <50% respiratory  variability, suggesting right atrial pressure of 8 mmHg.   Patient Profile     89 y.o. female with past medical history of coronary disease status post PCI to LAD and circumflex May 2021, moderate aortic stenosis, HFpEF, hypertension, hyperlipidemia, type 2 diabetes, stroke, hiatal hernia, anemia, asthma, and history of DVT, who is being seen and evaluated for chest pain and elevated high-sensitivity troponin in the setting of presumed PE, pneumonia, respiratory distress.   Assessment & Plan    Acute respiratory distress Imaging suggestive of multifocal pneumonia On broad-spectrum antibiotics, cough getting loose mentation and appetite improving, slow wean on oxygen, now on nasal cannula from high flow Pleuritic chest pain, worse with coughing On ABX  Intermediate to high probability PE on VQ scan On heparin infusion, No lower extremity DVT - consider chest CTA before committing to long-term anticoagulation Given chest pain, would be prudent to rule out ischemic lung Currently on heparin infusion Reports having contrast allergy but could  premedicate with Solu-Medrol, Benadryl  Severe pulmonary hypertension on echo/elevated BNP in the setting of pneumonia, unable to exclude PE With renal function improving, would continue IV Lasix 20 mg daily Potassium repletion  Hyponatremia Continues to trend downwards  Large hiatal hernia High risk for surgery  Chest pain Atypical in nature, worse with coughing Unable to exclude musculoskeletal versus PE complication  Aortic valve stenosis Estimated at least mild to moderate Conservative management at this time  Long discussion with family at  bedside concerning issues detailed above   For questions or updates, please contact [facility] Please consult [email or web address] for contact info under        Signed, Julien Nordmann, MD  06/20/2022, 2:23 PM

## 2022-06-20 NOTE — Inpatient Diabetes Management (Signed)
Inpatient Diabetes Program Recommendations  AACE/ADA: New Consensus Statement on Inpatient Glycemic Control (2015)  Target Ranges:  Prepandial:   less than 140 mg/dL      Peak postprandial:   less than 180 mg/dL (1-2 hours)      Critically ill patients:  140 - 180 mg/dL    Latest Reference Range & Units 06/19/22 12:57 06/19/22 17:03 06/19/22 21:12  Glucose-Capillary 70 - 99 mg/dL 253 (H) 664 (H) 403 (H)  (H): Data is abnormally high  Latest Reference Range & Units 06/20/22 09:08  Glucose-Capillary 70 - 99 mg/dL 474 (H)  (H): Data is abnormally high   Home DM Meds: Janumet 100/ 1000 mg Daily                              Humalog 2 units QPM                              Lantus 4 units QHS                              Trulicity 0.75 mg Qweek                                Current Orders: Novolog Sensitive Correction Scale/ SSI (0-9 units) TID AC + HS                           Semglee 3 units QHS       MD- Please consider:   1. Increase Semglee slightly to 4 units QHS (home dose)   2. Start Novolog Meal Coverage: Novolog 2 units TID with meals HOLD if pt NPO HOLD if pt eats <50% meals   --Will follow patient during hospitalization--  Ambrose Finland RN, MSN, CDCES Diabetes Coordinator Inpatient Glycemic Control Team Team Pager: [phone] (8a-5p)

## 2022-06-20 NOTE — Progress Notes (Signed)
Speech Language Pathology Treatment: Dysphagia  Patient Details Name: Lydia King MRN: [id number] DOB: [birth year] Today's Date: 06/20/2022 Time: 4098-1191 SLP Time Calculation (min) (ACUTE ONLY): 10 min  Assessment / Plan / Recommendation Clinical Impression  Pt seen for ongoing diet toleration. Overall, pt appears much improved over time of evaluation and previous session. Pt is awake, alert, conversant. While pt remains on supplemental O2, she does not become short of breath when talking or eating. Pt is free of overt s/s of dysphagia and aspiration when consuming dysphagia and aspiration. In an effort to continue medical progress, pt and her daughter are agreeable to remain on dysphagia 3 diet for energy conservation and ease of consumption. At this time, skilled ST intervention is no longer indicated. All education has been completed. All questions answered to pt and her family's satisfaction.    HPI HPI: Lydia King is a 89 y.o. female who presented to [facility] ED from [facility] on 06/16/2022 with chest pain for the last 3 days, persistent course, mainly in the left side of her chest, described as both pressure and sharp, and associated with nausea and belching as well as radiation to her back.  This feels similar to prior chest pain caused by her hiatal hernia.  Initial lab workup reveals elevated troponin to 415.  Labs are also significant for elevated WBC count.  EKG shows nonspecific T wave abnormalities but no ischemic findings.  Pt with history of DVT no longer on anticoagulation, CAD, stroke, asthma, anemia, hypertension, hyperlipidemia, and hiatal hernia. Pt evaluated by cardiology with concern noted for potential aspiration pneumonia given size of hiatal hernia.      SLP Plan  All goals met      Recommendations for follow up therapy are one component of a multi-disciplinary discharge planning process, led by the attending physician.  Recommendations may be updated  based on patient status, additional functional criteria and insurance authorization.    Recommendations  Diet recommendations: Dysphagia 3 (mechanical soft);Thin liquid Liquids provided via: Cup;Straw Medication Administration: Whole meds with liquid Supervision: Patient able to self feed Compensations: Minimize environmental distractions;Slow rate;Small sips/bites (reflux precautions) Postural Changes and/or Swallow Maneuvers: Out of bed for meals;Seated upright 90 degrees;Upright 30-60 min after meal                  Oral care BID     Dysphagia, unspecified (R13.10)     All goals met   Hetal Proano B. Dreama Saa, M.S., CCC-SLP, Tree surgeon Certified Brain Injury Specialist [facility]  [facility] Office [phone] Ascom [phone] Fax [phone]

## 2022-06-20 NOTE — Progress Notes (Signed)
[facility]  PROGRESS NOTE  Lydia King:[id number] DOB: 1930/04/12 DOA: 06/16/2022 PCP: Judy Pimple, MD   Brief HPI:   89 y.o. female with medical history significant of DVT no longer on anticoagulation, CAD, stent placement, stroke, asthma, anemia, hypertension, hyperlipidemia, hiatal hernia, GIB, depression with anxiety, gastric ulcer, diastolic CHF, OSA on CPAP, aortic stenosis, who presented with chest pain.  Troponin was elevated with peak troponin 415   Cardiology consult is obtained, patient also placed on heparin drip. Patient's oxygen requirement continued to rise, currently on 40 L/min HFNC, 37% FiO2.  VQ scan showed intermediate to high probability for pulmonary embolism CTA chest could not be obtained due to the contrast allergy    Assessment/Plan:    Acute hypoxemic respiratory failure -Significantly improved -Likely in setting of PE , acute on chronic diastolic CHF, multifocal pneumonia -Currently requiring oxygen via high flow nasal cannula 6 L/min -VQ scan showed multiple perfusion defects already in size in both lungs, intermediate to high probability for PE, evaluation limited without ventilation images. -Heparin GTT  -CT chest recommended however not obtained at this time due to history of contrast allergy -Chest x-ray obtained on 5/22 showed multifocal pneumonia, ceftriaxone was switched to vancomycin and cefepime.  -Clinically patient has improved -Pulmonology saw patient and did not recommend any catheter directed thrombolysis due to worsening respiratory status and high oxygen requirement.  -Will continue with anticoagulation for now.    NSTEMI -History of CAD, presented with troponin elevation -Currently on heparin drip -Echocardiogram shows severe pulmonary hypertension -Cardiology following  ?  Pulmonary embolism -VQ scan shows intermediate hypermobility for PE -Multiple perfusion defects in both lung fields -Evaluation limited without  ventilation images -Unable to obtain CTA chest due to contrast allergy; discussed with family that we can premedicate with Solu-Medrol however family is not very much keen on getting CTA chest due to previous history of anaphylaxis with contrast -Venous duplex of lower extremities was negative for DVT -Plan to continue with anticoagulation for presumed PE as per VQ scan -Will discontinue heparin and start Eliquis  Acute kidney injury -Resolved -Likely in setting of hypotension  Hypertension -Continue to hold lisinopril, clonidine, Imdur -Continue Coreg -Continue as needed hydralazine  Dysphagia -Patient had choking episode while eating -Swallow evaluation obtained -Started on dysphagia 3 diet  History of CVA (cerebrovascular accident) -ASA   HLD (hyperlipidemia): pt could not tolerate statin -f/u FLP   Asthma: -Bronchodilators  Hypokalemia -Potassium is 3.2 today -Replace potassium and follow BMP in am  Hyponatremia -Likely in setting of dehydration, poor p.o. intake -Follow serum sodium in a.m.   Type 2 diabetes mellitus without complications [facility]):  Recent A1c 7.0.   -Continue insulin glargine and sliding scale insulin.   Elevated lactic acid level:  Likely due to dehydration.   Depression with anxiety -Continue home medications  Medications     aspirin EC  81 mg Oral Daily   baclofen  5 mg Oral Daily   calcium carbonate  400 mg of elemental calcium Oral TID WC   carvedilol  3.125 mg Oral BID WC   famotidine  20 mg Oral QHS   fluconazole  100 mg Oral Daily   FLUoxetine  30 mg Oral Daily   insulin aspart  0-5 Units Subcutaneous QHS   insulin aspart  0-9 Units Subcutaneous TID WC   insulin glargine-yfgn  3 Units Subcutaneous QHS   ipratropium-albuterol  3 mL Nebulization Q6H   lidocaine  1 patch Transdermal Q24H  melatonin  2.5 mg Oral QHS   multivitamin  2 tablet Oral Daily   polyvinyl alcohol  1 drop Both Eyes 5 X Daily     Data Reviewed:    CBG:  Recent Labs  Lab 06/18/22 1759 06/18/22 2128 06/19/22 1257 06/19/22 1703 06/19/22 2112  GLUCAP 179* 156* 219* 223* 242*    SpO2: 99 % O2 Flow Rate (L/min): 35 L/min FiO2 (%): 35 %    Vitals:   06/19/22 2144 06/19/22 2307 06/20/22 0236 06/20/22 0551  BP:  137/63  (!) 151/63  Pulse:  78  81  Resp:  18  18  Temp:  99 F (37.2 C)  99.5 F (37.5 C)  TempSrc:      SpO2: 96% 99% 99% 99%  Weight:    59.9 kg  Height:          Data Reviewed:  Basic Metabolic Panel: Recent Labs  Lab 06/16/22 1454 06/17/22 0316 06/18/22 0418 06/19/22 0612  NA 130* 130* 128* 129*  K 3.9 4.2 3.7 3.5  CL 96* 98 97* 100  CO2 22 22 23  19*  GLUCOSE 228* 229* 259* 203*  BUN 43* 47* 50* 46*  CREATININE 1.41* 1.29* 1.07* 0.94  CALCIUM 9.6 9.5 9.2 9.5  MG  --   --  2.5*  --     CBC: Recent Labs  Lab 06/16/22 1454 06/17/22 0104 06/18/22 0418 06/19/22 0612 06/20/22 0410  WBC 17.9* 15.8* 9.8 12.6* 11.3*  HGB 13.2 12.6 11.2* 11.3* 11.5*  HCT 40.0 37.2 32.9* 33.9* 33.7*  MCV 93.5 94.2 92.7 92.9 90.6  PLT 140* 175 136* 160 174    LFT Recent Labs  Lab 06/19/22 0612  AST 9*  ALT 22  ALKPHOS 74  BILITOT 1.2  PROT 5.2*  ALBUMIN 2.2*     Antibiotics: Anti-infectives (From admission, onward)    Start     Dose/Rate Route Frequency Ordered Stop   06/20/22 1800  vancomycin (VANCOCIN) IVPB 1000 mg/200 mL premix        1,000 mg 200 mL/hr over 60 Minutes Intravenous Every 48 hours 06/18/22 1715     06/18/22 1800  ceFEPIme (MAXIPIME) 2 g in sodium chloride 0.9 % 100 mL IVPB        2 g 200 mL/hr over 30 Minutes Intravenous Every 24 hours 06/18/22 1711     06/18/22 1800  vancomycin (VANCOREADY) IVPB 1500 mg/300 mL        1,500 mg 150 mL/hr over 120 Minutes Intravenous  Once 06/18/22 1712 06/18/22 2136   06/18/22 1000  fluconazole (DIFLUCAN) tablet 100 mg        100 mg Oral Daily 06/18/22 0926     06/17/22 2200  fluconazole (DIFLUCAN) tablet 100 mg  Status:  Discontinued         100 mg Oral Every 48 hours 06/16/22 2056 06/18/22 0926   06/17/22 2000  cefTRIAXone (ROCEPHIN) 2 g in sodium chloride 0.9 % 100 mL IVPB  Status:  Discontinued        2 g 200 mL/hr over 30 Minutes Intravenous Every 24 hours 06/17/22 1819 06/18/22 1703        DVT prophylaxis: Heparin  Code Status: DNR  Family Communication: Discussed with patient's sister at bedside   CONSULTS cardiology   Subjective    Patient seen and examined, breathing significantly improved.  Now requiring  6 L/min of oxygen via HFNC.  Objective    Physical Examination:  Appears in no acute distress S1-S2, regular  Lungs are clear to auscultation bilaterally Abdomen soft, nontender, no organomegaly No edema in the lower extremities  Status is: Inpatient:             Meredeth Ide   [facility] If 7PM-7AM, please contact night-coverage at [email or web address], Office  [phone]   06/20/2022, 8:06 AM  LOS: 4 days

## 2022-06-21 DIAGNOSIS — R7989 Other specified abnormal findings of blood chemistry: Secondary | ICD-10-CM | POA: Diagnosis not present

## 2022-06-21 DIAGNOSIS — J9601 Acute respiratory failure with hypoxia: Secondary | ICD-10-CM | POA: Diagnosis not present

## 2022-06-21 DIAGNOSIS — I214 Non-ST elevation (NSTEMI) myocardial infarction: Secondary | ICD-10-CM | POA: Diagnosis not present

## 2022-06-21 DIAGNOSIS — I272 Pulmonary hypertension, unspecified: Secondary | ICD-10-CM | POA: Diagnosis not present

## 2022-06-21 LAB — BASIC METABOLIC PANEL
Anion gap: 6 (ref 5–15)
BUN: 22 mg/dL (ref 8–23)
CO2: 23 mmol/L (ref 22–32)
Calcium: 8.9 mg/dL (ref 8.9–10.3)
Chloride: 100 mmol/L (ref 98–111)
Creatinine, Ser: 0.63 mg/dL (ref 0.44–1.00)
GFR, Estimated: >60 mL/min (ref >60)
Glucose, Bld: 245 mg/dL — ABNORMAL HIGH (ref 70–99)
Potassium: 4.8 mmol/L (ref 3.5–5.1)
Sodium: 129 mmol/L — ABNORMAL LOW (ref 135–145)

## 2022-06-21 LAB — GLUCOSE, CAPILLARY
Glucose-Capillary: 256 mg/dL — ABNORMAL HIGH (ref 70–99)
Glucose-Capillary: 230 mg/dL — ABNORMAL HIGH (ref 70–99)
Glucose-Capillary: 193 mg/dL — ABNORMAL HIGH (ref 70–99)
Glucose-Capillary: 232 mg/dL — ABNORMAL HIGH (ref 70–99)

## 2022-06-21 MED ORDER — GUAIFENESIN ER 600 MG PO TB12
600.0000 mg | ORAL_TABLET | Freq: Two times a day (BID) | ORAL | Status: DC
  Administered 2022-06-21 – 2022-06-22 (×3): 600 mg via ORAL
  Filled 2022-06-21 (×3): qty 1

## 2022-06-21 MED ORDER — FUROSEMIDE 40 MG PO TABS: 40.0000 mg | ORAL_TABLET | Freq: Every day | ORAL | Status: DC

## 2022-06-21 MED ORDER — FUROSEMIDE 40 MG PO TABS
40.0000 mg | ORAL_TABLET | Freq: Every day | ORAL | Status: DC
  Administered 2022-06-22 – 2022-06-24 (×3): 40 mg via ORAL
  Filled 2022-06-21 (×3): qty 1

## 2022-06-21 NOTE — Progress Notes (Signed)
Cardiology Progress Note   Patient Name: Lydia King Date of Encounter: 06/21/2022  Primary Cardiologist: Julien Nordmann, MD  Subjective   Feels much better this AM.  On [place].  Sitting up in bed. Family at bedside. No chest pain.  Inpatient Medications    Scheduled Meds:  apixaban  10 mg Oral BID   Followed by   Melene Muller ON 06/27/2022] apixaban  5 mg Oral BID   aspirin EC  81 mg Oral Daily   baclofen  5 mg Oral Daily   calcium carbonate  400 mg of elemental calcium Oral TID WC   carvedilol  3.125 mg Oral BID WC   famotidine  20 mg Oral QHS   fluconazole  100 mg Oral Daily   FLUoxetine  30 mg Oral Daily   furosemide  20 mg Intravenous Daily   insulin aspart  0-5 Units Subcutaneous QHS   insulin aspart  0-9 Units Subcutaneous TID WC   insulin glargine-yfgn  3 Units Subcutaneous QHS   ipratropium-albuterol  3 mL Nebulization Q6H   lidocaine  1 patch Transdermal Q24H   melatonin  2.5 mg Oral QHS   multivitamin  2 tablet Oral Daily   polyvinyl alcohol  1 drop Both Eyes 5 X Daily   Continuous Infusions:  ceFEPime (MAXIPIME) IV 2 g (06/20/22 2227)   vancomycin Stopped (06/20/22 1248)   PRN Meds: acetaminophen, albuterol, alum & mag hydroxide-simeth, dextromethorphan-guaiFENesin, fentaNYL (SUBLIMAZE) injection, hydrALAZINE, nitroGLYCERIN, ondansetron (ZOFRAN) IV, mouth rinse, oxyCODONE-acetaminophen, polyethylene glycol   Vital Signs    Vitals:   06/21/22 0217 06/21/22 0219 06/21/22 0340 06/21/22 0728  BP:   (!) 157/88 (!) 127/57  Pulse:  77 84 79  Resp:  16 16 18   Temp:   98.1 F (36.7 C) 98.8 F (37.1 C)  TempSrc:      SpO2: 100%  100% 100%  Weight:   59.5 kg   Height:        Intake/Output Summary (Last 24 hours) at 06/21/2022 0746 Last data filed at 06/21/2022 0340 Gross per 24 hour  Intake 1600 ml  Output 1550 ml  Net 50 ml   Filed Weights   06/19/22 0500 06/20/22 0551 06/21/22 0340  Weight: 59.1 kg 59.9 kg 59.5 kg    Physical Exam   GEN: Well  nourished, well developed, in no acute distress.  HEENT: Grossly normal.  Neck: Supple, no JVD, carotid bruits, or masses. Cardiac: RRR, 3/6 syst murmur throughout.  No rubs or gallops. No clubbing, cyanosis, edema.  Radials 2+, DP/PT 1+ and equal bilaterally.  Respiratory:  Respirations regular and unlabored, scattered rhonchi w/ faint insp/exp wheezing. GI: Soft, nontender, nondistended, BS + x 4. MS: no deformity or atrophy. Skin: warm and dry, no rash. Neuro:  Strength and sensation are intact. Psych: AAOx3.  Normal affect.  Labs    Chemistry Recent Labs  Lab 06/19/22 0612 06/20/22 0820 06/21/22 0653  NA 129* 127* 129*  K 3.5 3.2* 4.8  CL 100 97* 100  CO2 19* 21* 23  GLUCOSE 203* 220* 245*  BUN 46* 28* 22  CREATININE 0.94 0.69 0.63  CALCIUM 9.5 8.9 8.9  PROT 5.2* 5.4*  --   ALBUMIN 2.2* 2.1*  --   AST 9* 10*  --   ALT 22 17  --   ALKPHOS 74 82  --   BILITOT 1.2 1.1  --   GFRNONAA 58* >60 >60  ANIONGAP 10 9 6      Hematology Recent Labs  Lab 06/18/22 0418 06/19/22 0612 06/20/22 0410  WBC 9.8 12.6* 11.3*  RBC 3.55* 3.65* 3.72*  HGB 11.2* 11.3* 11.5*  HCT 32.9* 33.9* 33.7*  MCV 92.7 92.9 90.6  MCH 31.5 31.0 30.9  MCHC 34.0 33.3 34.1  RDW 13.2 13.2 12.9  PLT 136* 160 174    Cardiac Enzymes  Recent Labs  Lab 06/16/22 1454 06/16/22 1703 06/16/22 2122 06/17/22 0104 06/17/22 0316  TROPONINIHS 415* 398* 307* 277* 278*      BNP    Component Value Date/Time   BNP 1,311.2 (H) 06/16/2022 1434    ProBNP    Component Value Date/Time   PROBNP 106.0 (H) 12/28/2008 0000   Lipids  Lab Results  Component Value Date   CHOL 222 (H) 06/17/2022   HDL 71 06/17/2022   LDLCALC 117 (H) 06/17/2022   LDLDIRECT 78.0 07/14/2016   TRIG 170 (H) 06/17/2022   CHOLHDL 3.1 06/17/2022    HbA1c  Lab Results  Component Value Date   HGBA1C 6.6 (H) 06/17/2022    Radiology    US Venous Img Lower Bilateral (DVT)  Result Date: 06/19/2022 IMPRESSION: No evidence  of deep venous thrombosis in either lower extremity. Electronically Signed   By: Malachy Moan M.D.   On: 06/19/2022 12:54   DG Chest Port 1 View  Result Date: 06/18/2022 CLINICAL DATA:  Pneumonia EXAM: PORTABLE CHEST 1 VIEW COMPARISON:  06/16/2022 FINDINGS: Single frontal view of the chest demonstrates stable enlargement of the cardiac silhouette. Since the prior exam, dense areas of consolidation of developed in the left upper lobe compatible with pneumonia. There is more patchy airspace disease elsewhere within the lungs. Continued hiatal hernia. No effusion or pneumothorax. No acute bony abnormalities. IMPRESSION: 1. Interval development of airspace disease, with dense left upper lobe consolidation most evident. Findings are consistent with multifocal pneumonia, though underlying sequela of pulmonary infarct cannot be excluded given recent V/Q scan findings. Electronically Signed   By: Sharlet Salina M.D.   On: 06/18/2022 15:08   NM Pulmonary Perfusion  Result Date: 06/17/2022 CLINICAL DATA:  Shortness of breath, high clinical suspicion for PE EXAM: NUCLEAR MEDICINE PERFUSION LUNG SCAN TECHNIQUE: Perfusion images were obtained in multiple projections after intravenous injection of radiopharmaceutical. Ventilation scans intentionally deferred if perfusion scan and chest x-ray adequate for interpretation during COVID 19 epidemic. RADIOPHARMACEUTICALS:  3.99 mCi Tc-44m MAA IV COMPARISON:  Chest radiograph done on 06/16/2022 FINDINGS: There are multiple perfusion defects of varying sizes in both lungs. Some of the defects appear to be which shaped. In the chest radiograph done on 06/16/2022, no focal pulmonary infiltrates were noted. Evaluation is limited without ventilation images. IMPRESSION: There are multiple perfusion defects all vein sizes in both lungs. Some of the defects show wedge shape. There are no matching infiltrates in the lung fields in the chest radiograph done on 06/16/2022. Findings  suggest intermediate to high probability for PE. Evaluation is limited without ventilation images. If clinically feasible, CT pulmonary angiogram should be considered. Electronically Signed   By: Ernie Avena M.D.   On: 06/17/2022 14:57   Telemetry    RSR - Personally Reviewed  Cardiac Studies   2D Echocardiogram 5.21.2024    1. Left ventricular ejection fraction, by estimation, is 65 to 70%. The  left ventricle has normal function. Left ventricular endocardial border  not optimally defined to evaluate regional wall motion. Left ventricular  diastolic parameters are consistent  with Grade II diastolic dysfunction (pseudonormalization). Elevated left  atrial pressure.   2.  Right ventricular systolic function is normal. The right ventricular  size is normal. There is severely elevated pulmonary artery systolic  pressure. The estimated right ventricular systolic pressure is 63.1 mmHg.   3. Left atrial size was moderately dilated.   4. The mitral valve is degenerative. Trivial mitral valve regurgitation.  No evidence of mitral stenosis. Moderate mitral annular calcification.   5. Tricuspid valve regurgitation is moderate to severe.   6. The aortic valve has an indeterminant number of cusps. There is severe  calcifcation of the aortic valve. There is severe thickening of the aortic  valve. Aortic valve regurgitation is not visualized. There is at least  mild to moderate aortic stenosis,   though degree of stenosis appears more severe by visual estimation. Poor  Doppler envelopes likely underestimate the aortic valve gradient.   7. Pulmonic valve regurgitation not well assessed.   8. The inferior vena cava is normal in size with <50% respiratory  variability, suggesting right atrial pressure of 8 mmHg.   Patient Profile     89 y/o female with past medical history of coronary disease status post PCI to LAD and circumflex May 2021, moderate aortic stenosis, HFpEF, hypertension,  hyperlipidemia, type 2 diabetes, stroke, hiatal hernia, anemia, asthma, and history of DVT, who is being seen and evaluated for chest pain and elevated high-sensitivity troponin in the setting of presumed PE.   Assessment & Plan    1.  Acute hypoxic resp failure/Multifocal PNA:  Off of HFNC and feeling much better this AM.  No further pleuritic c/p.  Still w/ coarse breath sounds and wheezing. Abx and nebs per IM.  2.  PE:  V:Q scan w/ intermediate to high prob PE.  RVSP 63.1.  Reluctance to pursue CTA to confirm due to contrast allergy and AKI on admission.  No DVT on LE u/s.  Now on PE dosed Eliquis.  3.  Sev PAH/Acute HFpEF:  EF 65-70% w/ RVSP 63.64mmHg.  She has been receiving lasix 20 mg IV daily.  + 50 yesterday and + 333 for admission.  Wt stable @ 59.5 kg (58 kg in April, same on admission).  Will transition to PO lasix.  HRs stable.  BP variable.  Cont carvedilol.  If potassium stable or low tomorrow, would look to add spiro (currently 4.8 after supplementation yesterday).  4.  Pleuritic chest pain:  Denies this AM but on further questioning notes a long h/o pleuritic c/p in the setting of rheumatoid arthritis.  ? Increased msk pain due to resp failure/tachypnea vs PE.  Again, CTA may be helpful to further elucidate presence of of PE.  5.  Large hiatal hernia:  no complaints of abd pain/difficulty swallowing today.  Prev eval by gen surgery and felt to be too high risk due to mult co-morbidities.  6.  Normocytic anemia:  stable.  7.  Hypokalemia:  Received supplementation yesterday.  4.8 today.  8.  Hyponatremia:  Na relatively stable @ 129.  Signed, Nicolasa Ducking, NP  06/21/2022, 7:46 AM    For questions or updates, please contact   Please consult [email or web address] for contact info under Cardiology/STEMI.

## 2022-06-21 NOTE — Progress Notes (Addendum)
Lydia King  PROGRESS NOTE  TALIESHA ACOSTA XLK:[id number] DOB: Sep 04, 1930 DOA: 06/16/2022 PCP: Judy Pimple, MD   Brief HPI:   89 y.o. female with medical history significant of DVT no longer on anticoagulation, CAD, stent placement, stroke, asthma, anemia, hypertension, hyperlipidemia, hiatal hernia, GIB, depression with anxiety, gastric ulcer, diastolic CHF, OSA on CPAP, aortic stenosis, who presented with chest pain.  Troponin was elevated with peak troponin 415   Cardiology consult is obtained, patient also placed on heparin drip. Patient's oxygen requirement continued to rise, currently on 40 L/min HFNC, 37% FiO2.  VQ scan showed intermediate to high probability for pulmonary embolism CTA chest could not be obtained due to the contrast allergy    Assessment/Plan:    Acute hypoxemic respiratory failure -Significantly improved -Likely in setting of PE , acute on chronic diastolic CHF, multifocal pneumonia -Oxygen is being weaned off; currently on nasal cannula 2 L/min -VQ scan showed multiple perfusion defects already in size in both lungs, intermediate to high probability for PE, evaluation limited without ventilation images. -Heparin GTT was switched to Eliquis -CT chest recommended however not obtained at this time due to history of contrast allergy -Chest x-ray obtained on 5/22 showed multifocal pneumonia, ceftriaxone was switched to vancomycin and cefepime.  -Pulmonology saw patient and did not recommend any catheter directed thrombolysis due to worsening respiratory status and high oxygen requirement.  -Will continue with anticoagulation for now.   -Will start Mucomyst nebulizer 4 times a day for 4 doses to help with secretions  Multifocal pneumonia -Seen on chest x-ray, ceftriaxone was changed to vancomycin and cefepime -Clinically significantly improved, oxygen is being weaned off -Patient has been on vancomycin for past 4 days, MRSA PCR negative, vancomycin was  discontinued -Continue cefepime  NSTEMI -History of CAD, presented with troponin elevation -Currently on apixaban -Echocardiogram shows severe pulmonary hypertension -Cardiology following -BNP was elevated to 1311, initially started on IV Lasix -Lasix has been switched to p.o. 40 mg daily from today -Cardiology following  ?  Pulmonary embolism -VQ scan shows intermediate hypermobility for PE -Multiple perfusion defects in both lung fields -Evaluation limited without ventilation images -Unable to obtain CTA chest due to contrast allergy; discussed with family that we can premedicate with Solu-Medrol however family is not very much keen on getting CTA chest due to previous history of anaphylaxis with contrast -Venous duplex of lower extremities was negative for DVT -Plan to continue with anticoagulation for presumed PE as per VQ scan -heparin was discontinued and patient started on Eliquis  Acute kidney injury -Resolved -Likely in setting of hypotension  Hypertension -Continue to hold lisinopril, clonidine, Imdur -Continue Coreg -Continue as needed hydralazine  Dysphagia -Patient had choking episode while eating -Swallow evaluation obtained -Started on dysphagia 3 diet  History of CVA (cerebrovascular accident) -ASA   HLD (hyperlipidemia): pt could not tolerate statin -f/u FLP   Asthma: -Bronchodilators  Hypokalemia -Replete  Hyponatremia -Likely in setting of dehydration, poor p.o. intake -Follow serum sodium in a.m.   Type 2 diabetes mellitus without complications [facility]):  Recent A1c 7.0.   -Continue insulin glargine and sliding scale insulin. -CBG fairly well-controlled   Depression with anxiety -Continue home medications  Medications     apixaban  10 mg Oral BID   Followed by   Melene Muller ON 06/27/2022] apixaban  5 mg Oral BID   aspirin EC  81 mg Oral Daily   baclofen  5 mg Oral Daily   calcium carbonate  400 mg of elemental  calcium Oral TID WC    carvedilol  3.125 mg Oral BID WC   famotidine  20 mg Oral QHS   fluconazole  100 mg Oral Daily   FLUoxetine  30 mg Oral Daily   furosemide  20 mg Intravenous Daily   guaiFENesin  600 mg Oral BID   insulin aspart  0-5 Units Subcutaneous QHS   insulin aspart  0-9 Units Subcutaneous TID WC   insulin glargine-yfgn  3 Units Subcutaneous QHS   ipratropium-albuterol  3 mL Nebulization Q6H   lidocaine  1 patch Transdermal Q24H   melatonin  2.5 mg Oral QHS   multivitamin  2 tablet Oral Daily   polyvinyl alcohol  1 drop Both Eyes 5 X Daily     Data Reviewed:   CBG:  Recent Labs  Lab 06/20/22 0908 06/20/22 1235 06/20/22 1628 06/20/22 2050 06/21/22 0730  GLUCAP 219* 262* 243* 186* 256*    SpO2: 100 % O2 Flow Rate (L/min): 6 L/min FiO2 (%): 35 %    Vitals:   06/21/22 0217 06/21/22 0219 06/21/22 0340 06/21/22 0728  BP:   (!) 157/88 (!) 127/57  Pulse:  77 84 79  Resp:  16 16 18   Temp:   98.1 F (36.7 C) 98.8 F (37.1 C)  TempSrc:      SpO2: 100%  100% 100%  Weight:   59.5 kg   Height:          Data Reviewed:  Basic Metabolic Panel: Recent Labs  Lab 06/17/22 0316 06/18/22 0418 06/19/22 0612 06/20/22 0820 06/21/22 0653  NA 130* 128* 129* 127* 129*  K 4.2 3.7 3.5 3.2* 4.8  CL 98 97* 100 97* 100  CO2 22 23 19* 21* 23  GLUCOSE 229* 259* 203* 220* 245*  BUN 47* 50* 46* 28* 22  CREATININE 1.29* 1.07* 0.94 0.69 0.63  CALCIUM 9.5 9.2 9.5 8.9 8.9  MG  --  2.5*  --   --   --     CBC: Recent Labs  Lab 06/16/22 1454 06/17/22 0104 06/18/22 0418 06/19/22 0612 06/20/22 0410  WBC 17.9* 15.8* 9.8 12.6* 11.3*  HGB 13.2 12.6 11.2* 11.3* 11.5*  HCT 40.0 37.2 32.9* 33.9* 33.7*  MCV 93.5 94.2 92.7 92.9 90.6  PLT 140* 175 136* 160 174    LFT Recent Labs  Lab 06/19/22 0612 06/20/22 0820  AST 9* 10*  ALT 22 17  ALKPHOS 74 82  BILITOT 1.2 1.1  PROT 5.2* 5.4*  ALBUMIN 2.2* 2.1*     Antibiotics: Anti-infectives (From admission, onward)    Start     Dose/Rate  Route Frequency Ordered Stop   06/20/22 1800  vancomycin (VANCOCIN) IVPB 1000 mg/200 mL premix  Status:  Discontinued        1,000 mg 200 mL/hr over 60 Minutes Intravenous Every 48 hours 06/18/22 1715 06/20/22 0950   06/20/22 1100  vancomycin (VANCOCIN) IVPB 1000 mg/200 mL premix        1,000 mg 200 mL/hr over 60 Minutes Intravenous Every 36 hours 06/20/22 0950     06/20/22 0945  ceFEPIme (MAXIPIME) 2 g in sodium chloride 0.9 % 100 mL IVPB        2 g 200 mL/hr over 30 Minutes Intravenous Every 12 hours 06/20/22 0932     06/18/22 1800  ceFEPIme (MAXIPIME) 2 g in sodium chloride 0.9 % 100 mL IVPB  Status:  Discontinued        2 g 200 mL/hr over 30 Minutes Intravenous Every  24 hours 06/18/22 1711 06/20/22 0932   06/18/22 1800  vancomycin (VANCOREADY) IVPB 1500 mg/300 mL        1,500 mg 150 mL/hr over 120 Minutes Intravenous  Once 06/18/22 1712 06/18/22 2136   06/18/22 1000  fluconazole (DIFLUCAN) tablet 100 mg        100 mg Oral Daily 06/18/22 0926     06/17/22 2200  fluconazole (DIFLUCAN) tablet 100 mg  Status:  Discontinued        100 mg Oral Every 48 hours 06/16/22 2056 06/18/22 0926   06/17/22 2000  cefTRIAXone (ROCEPHIN) 2 g in sodium chloride 0.9 % 100 mL IVPB  Status:  Discontinued        2 g 200 mL/hr over 30 Minutes Intravenous Every 24 hours 06/17/22 1819 06/18/22 1703        DVT prophylaxis: Heparin  Code Status: DNR  Family Communication: Discussed with patient's sister at bedside   CONSULTS cardiology   Subjective   Patient today having shortness of breath.  Having difficulty coughing up phlegm.  Objective    Physical Examination:  Appears in no acute distress S1-S2, regular Lungs clear to auscultation bilaterally Abdomen is soft, nontender, no organomegaly   Status is: Inpatient:             Meredeth Ide   [facility] If 7PM-7AM, please contact night-coverage at [email or web address], Office  [phone]   06/21/2022, 8:22 AM  LOS: 5  days

## 2022-06-22 DIAGNOSIS — I214 Non-ST elevation (NSTEMI) myocardial infarction: Secondary | ICD-10-CM | POA: Diagnosis not present

## 2022-06-22 DIAGNOSIS — R7989 Other specified abnormal findings of blood chemistry: Secondary | ICD-10-CM | POA: Diagnosis not present

## 2022-06-22 DIAGNOSIS — I272 Pulmonary hypertension, unspecified: Secondary | ICD-10-CM | POA: Diagnosis not present

## 2022-06-22 DIAGNOSIS — J9601 Acute respiratory failure with hypoxia: Secondary | ICD-10-CM | POA: Diagnosis not present

## 2022-06-22 LAB — CBC
HCT: 31.2 % — ABNORMAL LOW (ref 36.0–46.0)
Hemoglobin: 10.6 g/dL — ABNORMAL LOW (ref 12.0–15.0)
MCH: 30.9 pg (ref 26.0–34.0)
MCHC: 34 g/dL (ref 30.0–36.0)
MCV: 91 fL (ref 80.0–100.0)
Platelets: 224 10*3/uL (ref 150–400)
RBC: 3.43 MIL/uL — ABNORMAL LOW (ref 3.87–5.11)
RDW: 13.2 % (ref 11.5–15.5)
WBC: 18.1 10*3/uL — ABNORMAL HIGH (ref 4.0–10.5)
nRBC: 0 % (ref 0.0–0.2)

## 2022-06-22 LAB — BASIC METABOLIC PANEL
Anion gap: 7 (ref 5–15)
BUN: 26 mg/dL — ABNORMAL HIGH (ref 8–23)
CO2: 23 mmol/L (ref 22–32)
Calcium: 8.6 mg/dL — ABNORMAL LOW (ref 8.9–10.3)
Chloride: 96 mmol/L — ABNORMAL LOW (ref 98–111)
Creatinine, Ser: 0.62 mg/dL (ref 0.44–1.00)
GFR, Estimated: >60 mL/min (ref >60)
Glucose, Bld: 262 mg/dL — ABNORMAL HIGH (ref 70–99)
Potassium: 3.9 mmol/L (ref 3.5–5.1)
Sodium: 126 mmol/L — ABNORMAL LOW (ref 135–145)

## 2022-06-22 LAB — GLUCOSE, CAPILLARY
Glucose-Capillary: 270 mg/dL — ABNORMAL HIGH (ref 70–99)
Glucose-Capillary: 204 mg/dL — ABNORMAL HIGH (ref 70–99)
Glucose-Capillary: 183 mg/dL — ABNORMAL HIGH (ref 70–99)
Glucose-Capillary: 242 mg/dL — ABNORMAL HIGH (ref 70–99)

## 2022-06-22 LAB — CULTURE, BLOOD (ROUTINE X 2): Culture: NO GROWTH

## 2022-06-22 MED ORDER — GUAIFENESIN ER 600 MG PO TB12
1200.0000 mg | ORAL_TABLET | Freq: Two times a day (BID) | ORAL | Status: DC
  Administered 2022-06-22 – 2022-06-24 (×4): 1200 mg via ORAL
  Filled 2022-06-22 (×4): qty 2

## 2022-06-22 MED ORDER — ACETYLCYSTEINE 20 % IN SOLN
2.0000 mL | Freq: Four times a day (QID) | RESPIRATORY_TRACT | Status: DC
  Administered 2022-06-22 – 2022-06-23 (×2): 2 mL via RESPIRATORY_TRACT
  Filled 2022-06-22 (×5): qty 4

## 2022-06-22 NOTE — Progress Notes (Signed)
Cpap declined 

## 2022-06-22 NOTE — Progress Notes (Signed)
[facility]  PROGRESS NOTE  Lydia King ZOX:[id number] DOB: 29-Jul-1930 DOA: 06/16/2022 PCP: Judy Pimple, MD   Brief HPI:   89 y.o. female with medical history significant of DVT no longer on anticoagulation, CAD, stent placement, stroke, asthma, anemia, hypertension, hyperlipidemia, hiatal hernia, GIB, depression with anxiety, gastric ulcer, diastolic CHF, OSA on CPAP, aortic stenosis, who presented with chest pain.  Troponin was elevated with peak troponin 415   Cardiology consult is obtained, patient also placed on heparin drip. Patient's oxygen requirement continued to rise, currently on 40 L/min HFNC, 37% FiO2.  VQ scan showed intermediate to high probability for pulmonary embolism CTA chest could not be obtained due to the contrast allergy    Assessment/Plan:    Acute hypoxemic respiratory failure -Significantly improved -Likely in setting of PE , acute on chronic diastolic CHF, multifocal pneumonia -Oxygen is being weaned off; currently on nasal cannula 2 L/min -VQ scan showed multiple perfusion defects already in size in both lungs, intermediate to high probability for PE, evaluation limited without ventilation images. -Heparin GTT was switched to Eliquis -CT chest recommended however not obtained at this time due to history of contrast allergy -Chest x-ray obtained on 5/22 showed multifocal pneumonia, ceftriaxone was switched to vancomycin and cefepime.  -Pulmonology saw patient and did not recommend any catheter directed thrombolysis due to worsening respiratory status and high oxygen requirement.  -Will continue with anticoagulation for now.   -Start Mucomyst nebulizer 4 times a day to break the phlegm, also added Mucinex  Multifocal pneumonia -Seen on chest x-ray, ceftriaxone was changed to vancomycin and cefepime -Clinically significantly improved, oxygen is being weaned off -Patient has been on vancomycin for past 4 days, will discontinue vancomycin, MRSA PCR  is negative -Continue cefepime  NSTEMI -History of CAD, presented with troponin elevation -Currently on heparin drip -Echocardiogram shows severe pulmonary hypertension -Cardiology following -IV Lasix changed to p.o. Lasix  ?  Pulmonary embolism -VQ scan shows intermediate hypermobility for PE -Multiple perfusion defects in both lung fields -Evaluation limited without ventilation images -Unable to obtain CTA chest due to contrast allergy; discussed with family that we can premedicate with Solu-Medrol however family is not very much keen on getting CTA chest due to previous history of anaphylaxis with contrast -Venous duplex of lower extremities was negative for DVT -Plan to continue with anticoagulation for presumed PE as per VQ scan -heparin was discontinued and patient started on Eliquis  Acute kidney injury -Resolved -Likely in setting of hypotension  Hypertension -Continue to hold lisinopril, clonidine, Imdur -Continue Coreg -Continue as needed hydralazine  Dysphagia -Patient had choking episode while eating -Swallow evaluation obtained -Started on dysphagia 3 diet  History of CVA (cerebrovascular accident) -ASA   HLD (hyperlipidemia): pt could not tolerate statin -f/u FLP   Asthma: -Bronchodilators  Hypokalemia -Replete  Hyponatremia -Likely in setting of dehydration, poor p.o. intake -Follow serum sodium in a.m.   Type 2 diabetes mellitus without complications [facility]):  Recent A1c 7.0.   -Continue insulin glargine and sliding scale insulin. -CBG fairly well-controlled   Depression with anxiety -Continue home medications  Medications     apixaban  10 mg Oral BID   Followed by   Melene Muller ON 06/27/2022] apixaban  5 mg Oral BID   aspirin EC  81 mg Oral Daily   baclofen  5 mg Oral Daily   calcium carbonate  400 mg of elemental calcium Oral TID WC   carvedilol  3.125 mg Oral BID WC   famotidine  20 mg Oral QHS   fluconazole  100 mg Oral Daily    FLUoxetine  30 mg Oral Daily   furosemide  40 mg Oral Daily   guaiFENesin  600 mg Oral BID   insulin aspart  0-5 Units Subcutaneous QHS   insulin aspart  0-9 Units Subcutaneous TID WC   insulin glargine-yfgn  3 Units Subcutaneous QHS   ipratropium-albuterol  3 mL Nebulization Q6H   lidocaine  1 patch Transdermal Q24H   melatonin  2.5 mg Oral QHS   multivitamin  2 tablet Oral Daily   polyvinyl alcohol  1 drop Both Eyes 5 X Daily     Data Reviewed:   CBG:  Recent Labs  Lab 06/20/22 2050 06/21/22 0730 06/21/22 1320 06/21/22 1633 06/21/22 2118  GLUCAP 186* 256* 230* 193* 232*    SpO2: 99 % O2 Flow Rate (L/min): 2 L/min FiO2 (%): 35 %    Vitals:   06/21/22 2144 06/21/22 2300 06/22/22 0008 06/22/22 0518  BP:   110/63 (!) 123/51  Pulse:   89 83  Resp:   17 17  Temp:   98.8 F (37.1 C) 98.1 F (36.7 C)  TempSrc:   Oral   SpO2: 98% 94% 97% 99%  Weight:    60.5 kg  Height:          Data Reviewed:  Basic Metabolic Panel: Recent Labs  Lab 06/18/22 0418 06/19/22 0612 06/20/22 0820 06/21/22 0653 06/22/22 0357  NA 128* 129* 127* 129* 126*  K 3.7 3.5 3.2* 4.8 3.9  CL 97* 100 97* 100 96*  CO2 23 19* 21* 23 23  GLUCOSE 259* 203* 220* 245* 262*  BUN 50* 46* 28* 22 26*  CREATININE 1.07* 0.94 0.69 0.63 0.62  CALCIUM 9.2 9.5 8.9 8.9 8.6*  MG 2.5*  --   --   --   --     CBC: Recent Labs  Lab 06/17/22 0104 06/18/22 0418 06/19/22 0612 06/20/22 0410 06/22/22 0357  WBC 15.8* 9.8 12.6* 11.3* 18.1*  HGB 12.6 11.2* 11.3* 11.5* 10.6*  HCT 37.2 32.9* 33.9* 33.7* 31.2*  MCV 94.2 92.7 92.9 90.6 91.0  PLT 175 136* 160 174 224    LFT Recent Labs  Lab 06/19/22 0612 06/20/22 0820  AST 9* 10*  ALT 22 17  ALKPHOS 74 82  BILITOT 1.2 1.1  PROT 5.2* 5.4*  ALBUMIN 2.2* 2.1*     Antibiotics: Anti-infectives (From admission, onward)    Start     Dose/Rate Route Frequency Ordered Stop   06/20/22 1800  vancomycin (VANCOCIN) IVPB 1000 mg/200 mL premix  Status:   Discontinued        1,000 mg 200 mL/hr over 60 Minutes Intravenous Every 48 hours 06/18/22 1715 06/20/22 0950   06/20/22 1100  vancomycin (VANCOCIN) IVPB 1000 mg/200 mL premix  Status:  Discontinued        1,000 mg 200 mL/hr over 60 Minutes Intravenous Every 36 hours 06/20/22 0950 06/21/22 1155   06/20/22 0945  ceFEPIme (MAXIPIME) 2 g in sodium chloride 0.9 % 100 mL IVPB        2 g 200 mL/hr over 30 Minutes Intravenous Every 12 hours 06/20/22 0932     06/18/22 1800  ceFEPIme (MAXIPIME) 2 g in sodium chloride 0.9 % 100 mL IVPB  Status:  Discontinued        2 g 200 mL/hr over 30 Minutes Intravenous Every 24 hours 06/18/22 1711 06/20/22 0932   06/18/22 1800  vancomycin (VANCOREADY) IVPB  1500 mg/300 mL        1,500 mg 150 mL/hr over 120 Minutes Intravenous  Once 06/18/22 1712 06/18/22 2136   06/18/22 1000  fluconazole (DIFLUCAN) tablet 100 mg        100 mg Oral Daily 06/18/22 0926     06/17/22 2200  fluconazole (DIFLUCAN) tablet 100 mg  Status:  Discontinued        100 mg Oral Every 48 hours 06/16/22 2056 06/18/22 0926   06/17/22 2000  cefTRIAXone (ROCEPHIN) 2 g in sodium chloride 0.9 % 100 mL IVPB  Status:  Discontinued        2 g 200 mL/hr over 30 Minutes Intravenous Every 24 hours 06/17/22 1819 06/18/22 1703        DVT prophylaxis: Heparin  Code Status: DNR  Family Communication: Discussed with patient's sister at bedside   CONSULTS cardiology   Subjective   Complains of shortness of breath, unable to cough up phlegm.  Objective    Physical Examination:   General-appears in no acute distress Heart-S1-S2, regular, no murmur auscultated Lungs-clear to auscultation bilaterally, scattered wheezing noted Abdomen-soft, nontender, no organomegaly Extremities-no edema in the lower extremities Neuro-alert, oriented x3, no focal deficit noted  Status is: Inpatient:             Meredeth Ide   [facility] If 7PM-7AM, please contact night-coverage at  [email or web address], Office  [phone]   06/22/2022, 8:49 AM  LOS: 6 days

## 2022-06-23 DIAGNOSIS — I214 Non-ST elevation (NSTEMI) myocardial infarction: Secondary | ICD-10-CM | POA: Diagnosis not present

## 2022-06-23 DIAGNOSIS — J9601 Acute respiratory failure with hypoxia: Secondary | ICD-10-CM | POA: Diagnosis not present

## 2022-06-23 DIAGNOSIS — I272 Pulmonary hypertension, unspecified: Secondary | ICD-10-CM | POA: Diagnosis not present

## 2022-06-23 DIAGNOSIS — R7989 Other specified abnormal findings of blood chemistry: Secondary | ICD-10-CM | POA: Diagnosis not present

## 2022-06-23 LAB — CBC
HCT: 31.2 % — ABNORMAL LOW (ref 36.0–46.0)
Hemoglobin: 10.5 g/dL — ABNORMAL LOW (ref 12.0–15.0)
MCH: 30.7 pg (ref 26.0–34.0)
MCHC: 33.7 g/dL (ref 30.0–36.0)
MCV: 91.2 fL (ref 80.0–100.0)
Platelets: 284 10*3/uL (ref 150–400)
RBC: 3.42 MIL/uL — ABNORMAL LOW (ref 3.87–5.11)
RDW: 13.5 % (ref 11.5–15.5)
WBC: 14.5 10*3/uL — ABNORMAL HIGH (ref 4.0–10.5)
nRBC: 0 % (ref 0.0–0.2)

## 2022-06-23 LAB — BASIC METABOLIC PANEL
Anion gap: 7 (ref 5–15)
BUN: 20 mg/dL (ref 8–23)
CO2: 24 mmol/L (ref 22–32)
Calcium: 8.5 mg/dL — ABNORMAL LOW (ref 8.9–10.3)
Chloride: 95 mmol/L — ABNORMAL LOW (ref 98–111)
Creatinine, Ser: 0.7 mg/dL (ref 0.44–1.00)
GFR, Estimated: >60 mL/min (ref >60)
Glucose, Bld: 282 mg/dL — ABNORMAL HIGH (ref 70–99)
Potassium: 3.6 mmol/L (ref 3.5–5.1)
Sodium: 126 mmol/L — ABNORMAL LOW (ref 135–145)

## 2022-06-23 LAB — GLUCOSE, CAPILLARY
Glucose-Capillary: 237 mg/dL — ABNORMAL HIGH (ref 70–99)
Glucose-Capillary: 227 mg/dL — ABNORMAL HIGH (ref 70–99)
Glucose-Capillary: 274 mg/dL — ABNORMAL HIGH (ref 70–99)
Glucose-Capillary: 130 mg/dL — ABNORMAL HIGH (ref 70–99)

## 2022-06-23 MED ORDER — ALPRAZOLAM 0.5 MG PO TABS
0.5000 mg | ORAL_TABLET | Freq: Once | ORAL | Status: AC
  Administered 2022-06-23: 0.5 mg via ORAL
  Filled 2022-06-23: qty 1

## 2022-06-23 MED ORDER — INSULIN GLARGINE-YFGN 100 UNIT/ML ~~LOC~~ SOLN
10.0000 [IU] | Freq: Every day | SUBCUTANEOUS | Status: DC
  Administered 2022-06-23: 10 [IU] via SUBCUTANEOUS
  Filled 2022-06-23 (×2): qty 0.1

## 2022-06-23 MED ORDER — DIPHENHYDRAMINE HCL 50 MG/ML IJ SOLN
25.0000 mg | Freq: Once | INTRAMUSCULAR | Status: AC
  Administered 2022-06-23: 25 mg via INTRAVENOUS
  Filled 2022-06-23: qty 1

## 2022-06-23 NOTE — TOC Progression Note (Signed)
Transition of Care [facility]) - Progression Note    Patient Details  Name: Lydia King MRN: [id number] Date of Birth: [birth year]  [facility] CM/SW Contact  Garret Reddish, RN Phone Number: 06/23/2022, 9:58 AM  Clinical Narrative:    Chart reviewed.  Noted that patient was admitted with NSTEMI.  Cardiology was consulted.   Patient also being treated for Acute hypoxemic respiratory failure.  VQ scan was obtained and noted multiple perfusion defects already in size in both lungs, intermediate to high probability for PE, evaluation limited without ventilation images. CT scan was recommended however, patient has a contrast allergy.  Patient was started on Heparin Drip and now has been switched to po Eliquis.  Patient currently on 02 at 2L per [place].  Patient is also being treated for PNA.  Patient is currently on IV Cefepime. Noted that patient had a choking episode.  Speech was consulted to see patient and patient is currently on a dysaphia 3 diet.  Noted that patient also has a history of CVA.    Noted that patient is from [place].  Plan is to return back to [place] when medically stable.    TOC will continue to follow.      Expected Discharge Plan: Skilled Nursing Facility Barriers to Discharge: Continued Medical Work up  Expected Discharge Plan and Services     Post Acute Care Choice: Resumption of Svcs/PTA Provider Living arrangements for the past 2 months: Skilled Nursing Facility                                       Social Determinants of Health (SDOH) Interventions SDOH Screenings   Food Insecurity: No Food Insecurity (06/17/2022)  Housing: Low Risk  (06/17/2022)  Transportation Needs: No Transportation Needs (06/17/2022)  Utilities: Not At Risk (06/17/2022)  Depression (PHQ2-9): Low Risk  (10/31/2019)  Tobacco Use: Low Risk  (06/17/2022)    Readmission Risk Interventions    06/18/2022    1:45 PM  Readmission Risk Prevention Plan   PCP or Specialist Appt within 3-5 Days Complete  Social Work Consult for Recovery Care Planning/Counseling Complete  Palliative Care Screening Not Applicable  Medication Review [employer]) Complete

## 2022-06-23 NOTE — Progress Notes (Addendum)
[facility]  PROGRESS NOTE  Lydia King UJW:[id number] DOB: 08-Sep-1930 DOA: 06/16/2022 PCP: Judy Pimple, MD   Brief HPI:   89 y.o. female with medical history significant of DVT no longer on anticoagulation, CAD, stent placement, stroke, asthma, anemia, hypertension, hyperlipidemia, hiatal hernia, GIB, depression with anxiety, gastric ulcer, diastolic CHF, OSA on CPAP, aortic stenosis, who presented with chest pain.  Troponin was elevated with peak troponin 415   Cardiology consult is obtained, patient also placed on heparin drip. Patient's oxygen requirement continued to rise, currently on 40 L/min HFNC, 37% FiO2.  VQ scan showed intermediate to high probability for pulmonary embolism CTA chest could not be obtained due to the contrast allergy    Assessment/Plan:    Acute hypoxemic respiratory failure -Significantly improved -Likely in setting of PE , acute on chronic diastolic CHF, multifocal pneumonia -Oxygen is being weaned off; currently on nasal cannula 2 L/min -VQ scan showed multiple perfusion defects already in size in both lungs, intermediate to high probability for PE, evaluation limited without ventilation images. -Heparin GTT was switched to Eliquis -CT chest recommended however not obtained at this time due to history of contrast allergy -Chest x-ray obtained on 5/22 showed multifocal pneumonia, ceftriaxone was switched to vancomycin and cefepime.  -Pulmonology saw patient and did not recommend any catheter directed thrombolysis due to worsening respiratory status and high oxygen requirement.  -Will continue with anticoagulation for now.   -Started Mucomyst nebulizer 4 times a day to break the phlegm, also added Mucinex  Anxiety -Will give 1 dose of Xanax 0.5 mg p.o. x 1 for anxiety  Multifocal pneumonia -Seen on chest x-ray, ceftriaxone was changed to vancomycin and cefepime -Clinically significantly improved, oxygen is being weaned off -Patient has been  on vancomycin for past 4 days, will discontinue vancomycin, MRSA PCR is negative -Continue cefepime  NSTEMI -History of CAD, presented with troponin elevation -Currently on heparin drip -Echocardiogram shows severe pulmonary hypertension -Cardiology following -IV Lasix changed to p.o. Lasix -Discussed with cardiology, they will see patient today again to assess if she needs additional Lasix.  Diabetes mellitus type 2 -Continue sliding scale insulin with NovoLog -CBG has been elevated -Increase Semglee to 10 units subcu nightly  ?  Pulmonary embolism -VQ scan shows intermediate hypermobility for PE -Multiple perfusion defects in both lung fields -Evaluation limited without ventilation images -Unable to obtain CTA chest due to contrast allergy; discussed with family that we can premedicate with Solu-Medrol however family is not very much keen on getting CTA chest due to previous history of anaphylaxis with contrast -Venous duplex of lower extremities was negative for DVT -Plan to continue with anticoagulation for presumed PE as per VQ scan -heparin was discontinued and patient started on Eliquis  Acute kidney injury -Resolved -Likely in setting of hypotension  Hypertension -Continue to hold lisinopril, clonidine, Imdur -Continue Coreg -Continue as needed hydralazine  Dysphagia -Patient had choking episode while eating -Swallow evaluation obtained -Started on dysphagia 3 diet  History of CVA (cerebrovascular accident) -ASA   HLD (hyperlipidemia): pt could not tolerate statin -f/u FLP   Asthma: -Bronchodilators  Hypokalemia -Replete  Hyponatremia -Likely in setting of dehydration, poor p.o. intake -Sodium has improved to 129 -Follow serum sodium in a.m.   Type 2 diabetes mellitus without complications [facility]):  Recent A1c 7.0.   -Continue insulin glargine and sliding scale insulin. -CBG fairly well-controlled   Depression with anxiety -Continue home  medications  Medications     acetylcysteine  2 mL  Nebulization QID   apixaban  10 mg Oral BID   Followed by   Melene Muller ON 06/27/2022] apixaban  5 mg Oral BID   aspirin EC  81 mg Oral Daily   baclofen  5 mg Oral Daily   calcium carbonate  400 mg of elemental calcium Oral TID WC   carvedilol  3.125 mg Oral BID WC   famotidine  20 mg Oral QHS   fluconazole  100 mg Oral Daily   FLUoxetine  30 mg Oral Daily   furosemide  40 mg Oral Daily   guaiFENesin  1,200 mg Oral BID   insulin aspart  0-5 Units Subcutaneous QHS   insulin aspart  0-9 Units Subcutaneous TID WC   insulin glargine-yfgn  3 Units Subcutaneous QHS   ipratropium-albuterol  3 mL Nebulization Q6H   lidocaine  1 patch Transdermal Q24H   melatonin  2.5 mg Oral QHS   multivitamin  2 tablet Oral Daily   polyvinyl alcohol  1 drop Both Eyes 5 X Daily     Data Reviewed:   CBG:  Recent Labs  Lab 06/22/22 0916 06/22/22 1201 06/22/22 1712 06/22/22 2045 06/23/22 0730  GLUCAP 270* 204* 183* 242* 237*    SpO2: 96 % O2 Flow Rate (L/min): 2 L/min FiO2 (%): 35 %    Vitals:   06/23/22 0238 06/23/22 0359 06/23/22 0728 06/23/22 0759  BP:  134/60 (!) 145/59   Pulse:  91 90   Resp:  20 16   Temp:  98.7 F (37.1 C) 97.7 F (36.5 C)   TempSrc:  Oral    SpO2: 94% 97% 100% 96%  Weight:      Height:          Data Reviewed:  Basic Metabolic Panel: Recent Labs  Lab 06/18/22 0418 06/19/22 0612 06/20/22 0820 06/21/22 0653 06/22/22 0357  NA 128* 129* 127* 129* 126*  K 3.7 3.5 3.2* 4.8 3.9  CL 97* 100 97* 100 96*  CO2 23 19* 21* 23 23  GLUCOSE 259* 203* 220* 245* 262*  BUN 50* 46* 28* 22 26*  CREATININE 1.07* 0.94 0.69 0.63 0.62  CALCIUM 9.2 9.5 8.9 8.9 8.6*  MG 2.5*  --   --   --   --     CBC: Recent Labs  Lab 06/17/22 0104 06/18/22 0418 06/19/22 0612 06/20/22 0410 06/22/22 0357  WBC 15.8* 9.8 12.6* 11.3* 18.1*  HGB 12.6 11.2* 11.3* 11.5* 10.6*  HCT 37.2 32.9* 33.9* 33.7* 31.2*  MCV 94.2 92.7 92.9  90.6 91.0  PLT 175 136* 160 174 224    LFT Recent Labs  Lab 06/19/22 0612 06/20/22 0820  AST 9* 10*  ALT 22 17  ALKPHOS 74 82  BILITOT 1.2 1.1  PROT 5.2* 5.4*  ALBUMIN 2.2* 2.1*     Antibiotics: Anti-infectives (From admission, onward)    Start     Dose/Rate Route Frequency Ordered Stop   06/20/22 1800  vancomycin (VANCOCIN) IVPB 1000 mg/200 mL premix  Status:  Discontinued        1,000 mg 200 mL/hr over 60 Minutes Intravenous Every 48 hours 06/18/22 1715 06/20/22 0950   06/20/22 1100  vancomycin (VANCOCIN) IVPB 1000 mg/200 mL premix  Status:  Discontinued        1,000 mg 200 mL/hr over 60 Minutes Intravenous Every 36 hours 06/20/22 0950 06/21/22 1155   06/20/22 0945  ceFEPIme (MAXIPIME) 2 g in sodium chloride 0.9 % 100 mL IVPB  2 g 200 mL/hr over 30 Minutes Intravenous Every 12 hours 06/20/22 0932     06/18/22 1800  ceFEPIme (MAXIPIME) 2 g in sodium chloride 0.9 % 100 mL IVPB  Status:  Discontinued        2 g 200 mL/hr over 30 Minutes Intravenous Every 24 hours 06/18/22 1711 06/20/22 0932   06/18/22 1800  vancomycin (VANCOREADY) IVPB 1500 mg/300 mL        1,500 mg 150 mL/hr over 120 Minutes Intravenous  Once 06/18/22 1712 06/18/22 2136   06/18/22 1000  fluconazole (DIFLUCAN) tablet 100 mg        100 mg Oral Daily 06/18/22 0926     06/17/22 2200  fluconazole (DIFLUCAN) tablet 100 mg  Status:  Discontinued        100 mg Oral Every 48 hours 06/16/22 2056 06/18/22 0926   06/17/22 2000  cefTRIAXone (ROCEPHIN) 2 g in sodium chloride 0.9 % 100 mL IVPB  Status:  Discontinued        2 g 200 mL/hr over 30 Minutes Intravenous Every 24 hours 06/17/22 1819 06/18/22 1703        DVT prophylaxis: Heparin  Code Status: DNR  Family Communication: Discussed with patient's sister at bedside   CONSULTS cardiology   Subjective   Patient seen, having difficulty coughing up phlegm.  Denies shortness of breath.  Oxygen requirement is down to 2 L/min via nasal  cannula.  Objective    Physical Examination:  Appears in no acute distress S1-S2, regular, no murmur auscultated Lungs clear to auscultation bilaterally Abdomen is soft, nontender, no organomegaly Extremities-no edema in the lower extremities   Status is: Inpatient:             Meredeth Ide   [facility] If 7PM-7AM, please contact night-coverage at [email or web address], Office  [phone]   06/23/2022, 8:54 AM  LOS: 7 days

## 2022-06-23 NOTE — Progress Notes (Signed)
Cardiology Progress Note   Patient Name: Lydia King Date of Encounter: 06/23/2022  Primary Cardiologist: Julien Nordmann, MD  Subjective   Asked by Dr. Sharl Ma to re-evaluate pt re: lasix dose.  Pt notes that breathing has been good.  She continues to feel intermittently weak but overall has been stable.  Coughing some, but not much mucous production.  No chest pain this AM, though notes that it is generally intermittent.  Inpatient Medications    Scheduled Meds:  acetylcysteine  2 mL Nebulization QID   ALPRAZolam  0.5 mg Oral Once   apixaban  10 mg Oral BID   Followed by   Melene Muller ON 06/27/2022] apixaban  5 mg Oral BID   aspirin EC  81 mg Oral Daily   baclofen  5 mg Oral Daily   calcium carbonate  400 mg of elemental calcium Oral TID WC   carvedilol  3.125 mg Oral BID WC   famotidine  20 mg Oral QHS   fluconazole  100 mg Oral Daily   FLUoxetine  30 mg Oral Daily   furosemide  40 mg Oral Daily   guaiFENesin  1,200 mg Oral BID   insulin aspart  0-5 Units Subcutaneous QHS   insulin aspart  0-9 Units Subcutaneous TID WC   insulin glargine-yfgn  3 Units Subcutaneous QHS   lidocaine  1 patch Transdermal Q24H   melatonin  2.5 mg Oral QHS   multivitamin  2 tablet Oral Daily   polyvinyl alcohol  1 drop Both Eyes 5 X Daily   Continuous Infusions:  ceFEPime (MAXIPIME) IV 2 g (06/23/22 1036)   PRN Meds: acetaminophen, albuterol, alum & mag hydroxide-simeth, fentaNYL (SUBLIMAZE) injection, hydrALAZINE, nitroGLYCERIN, ondansetron (ZOFRAN) IV, mouth rinse, oxyCODONE-acetaminophen, polyethylene glycol   Vital Signs    Vitals:   06/23/22 0238 06/23/22 0359 06/23/22 0728 06/23/22 0759  BP:  134/60 (!) 145/59   Pulse:  91 90   Resp:  20 16   Temp:  98.7 F (37.1 C) 97.7 F (36.5 C)   TempSrc:  Oral    SpO2: 94% 97% 100% 96%  Weight:      Height:        Intake/Output Summary (Last 24 hours) at 06/23/2022 1129 Last data filed at 06/23/2022 1042 Gross per 24 hour  Intake 1260  ml  Output 400 ml  Net 860 ml   Filed Weights   06/20/22 0551 06/21/22 0340 06/22/22 0518  Weight: 59.9 kg 59.5 kg 60.5 kg    Physical Exam   GEN: Well nourished, well developed, in no acute distress.  HEENT: Grossly normal.  Neck: Supple, JVP ~ 10 cm, no carotid bruits or masses. Cardiac: RRR, 3/6 syst murmur throughout.  No rubs, or gallops. No clubbing, cyanosis, edema.  Radials 2+, DP/PT 2+ and equal bilaterally.  Respiratory:  Respirations regular and unlabored, clear to auscultation bilaterally. GI: Soft, nontender, nondistended, BS + x 4. MS: no deformity or atrophy. Skin: warm and dry, no rash. Neuro:  Strength and sensation are intact. Psych: AAOx3.  Normal affect.  Labs    Chemistry Recent Labs  Lab 06/19/22 0612 06/20/22 0820 06/21/22 0653 06/22/22 0357 06/23/22 1025  NA 129* 127* 129* 126* 126*  K 3.5 3.2* 4.8 3.9 3.6  CL 100 97* 100 96* 95*  CO2 19* 21* 23 23 24   GLUCOSE 203* 220* 245* 262* 282*  BUN 46* 28* 22 26* 20  CREATININE 0.94 0.69 0.63 0.62 0.70  CALCIUM 9.5 8.9 8.9 8.6* 8.5*  PROT 5.2* 5.4*  --   --   --   ALBUMIN 2.2* 2.1*  --   --   --   AST 9* 10*  --   --   --   ALT 22 17  --   --   --   ALKPHOS 74 82  --   --   --   BILITOT 1.2 1.1  --   --   --   GFRNONAA 58* >60 >60 >60 >60  ANIONGAP 10 9 6 7 7      Hematology Recent Labs  Lab 06/20/22 0410 06/22/22 0357 06/23/22 1025  WBC 11.3* 18.1* 14.5*  RBC 3.72* 3.43* 3.42*  HGB 11.5* 10.6* 10.5*  HCT 33.7* 31.2* 31.2*  MCV 90.6 91.0 91.2  MCH 30.9 30.9 30.7  MCHC 34.1 34.0 33.7  RDW 12.9 13.2 13.5  PLT 174 224 284    Cardiac Enzymes  Recent Labs  Lab 06/16/22 1454 06/16/22 1703 06/16/22 2122 06/17/22 0104 06/17/22 0316  TROPONINIHS 415* 398* 307* 277* 278*      BNP    Component Value Date/Time   BNP 1,311.2 (H) 06/16/2022 1434    ProBNP    Component Value Date/Time   PROBNP 106.0 (H) 12/28/2008 0000    Lipids  Lab Results  Component Value Date   CHOL 222  (H) 06/17/2022   HDL 71 06/17/2022   LDLCALC 117 (H) 06/17/2022   LDLDIRECT 78.0 07/14/2016   TRIG 170 (H) 06/17/2022   CHOLHDL 3.1 06/17/2022    HbA1c  Lab Results  Component Value Date   HGBA1C 6.6 (H) 06/17/2022    Radiology    US Venous Img Lower Bilateral (DVT)  Result Date: 06/19/2022 CLINICAL DATA:  Bilateral lower extremity pain, indeterminate duration EXAM: BILATERAL LOWER EXTREMITY VENOUS DOPPLER ULTRASOUND TECHNIQUE: Gray-scale sonography with graded compression, as well as color Doppler and duplex ultrasound were performed to evaluate the lower extremity deep venous systems from the level of the common femoral vein and including the common femoral, femoral, profunda femoral, popliteal and calf veins including the posterior tibial, peroneal and gastrocnemius veins when visible. The superficial great saphenous vein was also interrogated. Spectral Doppler was utilized to evaluate flow at rest and with distal augmentation maneuvers in the common femoral, femoral and popliteal veins. COMPARISON:  None Available. FINDINGS: RIGHT LOWER EXTREMITY Common Femoral Vein: No evidence of thrombus. Normal compressibility, respiratory phasicity and response to augmentation. Saphenofemoral Junction: No evidence of thrombus. Normal compressibility and flow on color Doppler imaging. Profunda Femoral Vein: No evidence of thrombus. Normal compressibility and flow on color Doppler imaging. Femoral Vein: No evidence of thrombus. Normal compressibility, respiratory phasicity and response to augmentation. Popliteal Vein: No evidence of thrombus. Normal compressibility, respiratory phasicity and response to augmentation. Calf Veins: No evidence of thrombus. Normal compressibility and flow on color Doppler imaging. Superficial Great Saphenous Vein: No evidence of thrombus. Normal compressibility. Venous Reflux:  None. Other Findings:  None. LEFT LOWER EXTREMITY Common Femoral Vein: No evidence of thrombus. Normal  compressibility, respiratory phasicity and response to augmentation. Saphenofemoral Junction: No evidence of thrombus. Normal compressibility and flow on color Doppler imaging. Profunda Femoral Vein: No evidence of thrombus. Normal compressibility and flow on color Doppler imaging. Femoral Vein: No evidence of thrombus. Normal compressibility, respiratory phasicity and response to augmentation. Popliteal Vein: No evidence of thrombus. Normal compressibility, respiratory phasicity and response to augmentation. Calf Veins: No evidence of thrombus. Normal compressibility and flow on color Doppler imaging. Superficial Great Saphenous Vein: No  evidence of thrombus. Normal compressibility. Venous Reflux:  None. Other Findings:  None. IMPRESSION: No evidence of deep venous thrombosis in either lower extremity. Electronically Signed   By: Malachy Moan M.D.   On: 06/19/2022 12:54    Telemetry    RSR, 80's - Personally Reviewed  Cardiac Studies   2D Echocardiogram 5.21.2024    1. Left ventricular ejection fraction, by estimation, is 65 to 70%. The  left ventricle has normal function. Left ventricular endocardial border  not optimally defined to evaluate regional wall motion. Left ventricular  diastolic parameters are consistent  with Grade II diastolic dysfunction (pseudonormalization). Elevated left  atrial pressure.   2. Right ventricular systolic function is normal. The right ventricular  size is normal. There is severely elevated pulmonary artery systolic  pressure. The estimated right ventricular systolic pressure is 63.1 mmHg.   3. Left atrial size was moderately dilated.   4. The mitral valve is degenerative. Trivial mitral valve regurgitation.  No evidence of mitral stenosis. Moderate mitral annular calcification.   5. Tricuspid valve regurgitation is moderate to severe.   6. The aortic valve has an indeterminant number of cusps. There is severe  calcifcation of the aortic valve. There is  severe thickening of the aortic  valve. Aortic valve regurgitation is not visualized. There is at least  mild to moderate aortic stenosis,   though degree of stenosis appears more severe by visual estimation. Poor  Doppler envelopes likely underestimate the aortic valve gradient.   7. Pulmonic valve regurgitation not well assessed.   8. The inferior vena cava is normal in size with <50% respiratory  variability, suggesting right atrial pressure of 8 mmHg.   Patient Profile     89 y/o female with past medical history of coronary disease status post PCI to LAD and circumflex May 2021, moderate aortic stenosis, HFpEF, hypertension, hyperlipidemia, type 2 diabetes, stroke, hiatal hernia, anemia, asthma, and history of DVT, who is being seen and evaluated for chest pain and elevated high-sensitivity troponin in the setting of presumed PE.    Assessment & Plan    1.  Acute hypoxic resp failure/Multifocal PNA:  Has been doing wll on O2 via [place] w/o recurrent need for HFNC.  Breathing stable the past few days. Coughing intermittently - not really able to bring up much.  No pleuritic c/p this AM. Lungs are clear.  Abx and nebs per IM.   2.  PE:  V:Q scan w/ intermediate to high prob PE.  RVSP 63.1.  Reluctance to pursue CTA to confirm due to contrast allergy and AKI on admission.  No DVT on LE u/s.  Now on PE dosed Eliquis.   3.  Sev PAH/Acute HFpEF:  EF 65-70% w/ RVSP 63.7mmHg.  Transitioned to PO lasix on 5/25.  Breathing stable on [place].  Mildly elevated JVP in setting of PAH and mod-sev TR.  No edema and lungs are clear.  + 500 ml yesterday and + 533 since admission.  Wt increased by 1 kg on bedscale yesterday.  No wt yet this AM.  She says that dry wt at home historically around 139 lbs (currently 133 lbs).  Cont current dose of PO lasix.  Consider low dose spironolactone, though this  may serve to worsen hyponatremia.  Cont carvedilol.  Poor candidate for SGLT2i.   4.  Pleuritic chest pain/Demand  Ischemia:  Trop 398 on admission.  Echo w/ nl EF and PAH.  No c/p this AM, but notes that it comes and  goes.  Prev reported a long h/o pleuritic c/p in the setting of rheumatoid arthritis.  ? Increased msk pain due to resp failure/tachypnea vs PE.  Abx for PNA/Eliquis for presumed PE.   5.  Large hiatal hernia:  no complaints of abd pain/difficulty swallowing today.  Prev eval by gen surgery and felt to be too high risk due to mult co-morbidities.   6.  Normocytic anemia:  stable.   7.  Hypokalemia:  K 3.9 this AM.  As above, consider spiro in Na stabilizes.   8.  Hyponatremia:  Na lower in past 2 days @ 126. Has had predominantly low Na values dating back to 2021.  Does not appear markedly volume overloaded.  ? Role of lasix rx, which she has been on for several years.  9.  Valvular Heart Dzs:  Mod-sev TR, at least mild to mod AS (though visually appears worse).  Conservative mgmt.  Signed, Nicolasa Ducking, NP  06/23/2022, 11:29 AM    For questions or updates, please contact   Please consult [email or web address] for contact info under Cardiology/STEMI. d

## 2022-06-23 NOTE — [place] FL2 (Signed)
[place] MEDICAID FL2 LEVEL OF CARE FORM     IDENTIFICATION  Patient Name: Lydia King Birthdate: 09/20/1930 Sex: female Admission Date (Current Location): 06/16/2022  [place] and [place] Number:  [address] [phone] [facility] Facility and Address:  [facility], [address] [place], [place] [address]      Provider Number: [id number]  Attending Physician Name and Address:  Meredeth Ide, MD  Relative Name and Phone Number:  Malena Peer [phone]    Current Level of Care: Hospital Recommended Level of Care: Other (Comment) (Long Term Care) Prior Approval Number:    Date Approved/Denied:   PASRR Number:    Discharge Plan: SNF (Long term Care Resident)    Current Diagnoses: Patient Active Problem List   Diagnosis Date Noted   Demand ischemia 06/19/2022   Pulmonary hypertension, unspecified (HCC) 06/18/2022   Hyponatremia 06/17/2022   Hypotension 06/17/2022   Acute respiratory failure with hypoxia (HCC) 06/17/2022   Elevated troponin 06/17/2022   NSTEMI (non-ST elevated myocardial infarction) (HCC) 06/16/2022   HLD (hyperlipidemia) 06/16/2022   HTN (hypertension) 06/16/2022   Depression with anxiety 06/16/2022   Leukocytosis 06/16/2022   Elevated lactic acid level 06/16/2022   AKI (acute kidney injury) (HCC) 06/16/2022   Mouth ulcers 05/19/2021   Dysphagia 10/08/2020   Pain due to onychomycosis of toenails of both feet 11/24/2019   Anxiety 11/09/2019   Chronic diastolic CHF (congestive heart failure) (HCC) 11/03/2019   Large hiatal hernia    Chest pain 11/02/2019   CAD (coronary artery disease) 08/01/2019   Anemia 08/01/2019   Acute on chronic congestive heart failure (HCC) 07/31/2019   Acute decompensated heart failure (HCC) 07/03/2019   Bradycardia 06/17/2019   Hypoalbuminemia 06/17/2019   Moderate aortic stenosis 06/15/2019   Mitral valve regurgitation 06/15/2019   Positive cardiac stress test 06/13/2019   Periprosthetic  fracture around internal prosthetic right knee joint 03/15/2019   Hyperlipidemia associated with type 2 diabetes mellitus (HCC) 07/27/2018   Sinus congestion 02/05/2018   Vertigo 05/06/2017   OSA (obstructive sleep apnea) 01/02/2016   Pedal edema 01/02/2016   Fall in home 12/11/2015   Poor balance 12/11/2015   Low back pain with sciatica 10/02/2015   Right hip pain 10/02/2015   Hearing loss 04/18/2015   Encounter for Medicare annual wellness exam 10/04/2013   Type 2 diabetes mellitus without complications (HCC) 08/31/2012   History of CVA (cerebrovascular accident) 09/12/2008   Osteoarthritis of multiple joints 05/30/2008   Osteoporosis 05/30/2008   Vitamin D deficiency 07/14/2007   Sleep apnea 07/14/2007   Essential hypertension 11/30/2006   Asthma 11/30/2006   H/O gastroesophageal reflux (GERD) 11/30/2006   G I BLEED 11/30/2006   DEEP VENOUS THROMBOPHLEBITIS, HX OF 11/30/2006    Orientation RESPIRATION BLADDER Height & Weight     Self, Time, Situation  O2 (02 2L per [place]) Incontinent Weight: 60.5 kg Height:  4\' 10"  (147.3 cm)  BEHAVIORAL SYMPTOMS/MOOD NEUROLOGICAL BOWEL NUTRITION STATUS      Incontinent  (See Discharge Summary)  AMBULATORY STATUS COMMUNICATION OF NEEDS Skin   Extensive Assist Verbally Normal                       Personal Care Assistance Level of Assistance  Bathing, Feeding, Dressing Bathing Assistance: Maximum assistance Feeding assistance: Maximum assistance Dressing Assistance: Maximum assistance     Functional Limitations Info  Sight, Hearing, Speech Sight Info: Adequate Hearing Info: Adequate Speech Info: Impaired (Can be gargled at times.)  SPECIAL CARE FACTORS FREQUENCY  Speech therapy             Speech Therapy Frequency: 2x weekly      Contractures Contractures Info: Not present    Additional Factors Info  Code Status, Allergies Code Status Info: DNR Allergies Info: Morphine And Codeine, Other, Shrimp (Shellfish  Allergy), Amlodipine, Amlodipine Besylate, Atorvastatin, Ciprofloxacin, Fentanyl, Influenza Vaccines, Iodinated Contrast Media, Naproxen, Nitrofurantoin, Omeprazole, Pantoprazole Sodium, Pantoprazole Sodium, Penicillin G, Penicillins, Simvastatin, Sulfa Antibiotics, Sulfonamide Derivatives, Codeine, Prednisone           Current Medications (06/23/2022):  This is the current hospital active medication list Current Facility-Administered Medications  Medication Dose Route Frequency Provider Last Rate Last Admin   acetaminophen (TYLENOL) tablet 650 mg  650 mg Oral Q6H PRN Lorretta Harp, MD   650 mg at 06/22/22 2212   acetylcysteine (MUCOMYST) 20 % nebulizer / oral solution 2 mL  2 mL Nebulization QID Meredeth Ide, MD   2 mL at 06/23/22 0755   albuterol (PROVENTIL) (2.5 MG/3ML) 0.083% nebulizer solution 3 mL  3 mL Nebulization Q4H PRN Lorretta Harp, MD   3 mL at 06/20/22 1629   alum & mag hydroxide-simeth (MAALOX/MYLANTA) 200-200-20 MG/5ML suspension 30 mL  30 mL Oral QID PRN Marrion Coy, MD       apixaban Everlene Balls) tablet 10 mg  10 mg Oral BID Meredeth Ide, MD   10 mg at 06/23/22 4403   Followed by   Melene Muller ON 06/27/2022] apixaban (ELIQUIS) tablet 5 mg  5 mg Oral BID Meredeth Ide, MD       aspirin EC tablet 81 mg  81 mg Oral Daily Lorretta Harp, MD   81 mg at 06/23/22 0820   baclofen (LIORESAL) tablet 5 mg  5 mg Oral Daily Lorretta Harp, MD   5 mg at 06/23/22 0818   calcium carbonate (TUMS - dosed in mg elemental calcium) chewable tablet 400 mg of elemental calcium  400 mg of elemental calcium Oral TID WC Marrion Coy, MD   400 mg of elemental calcium at 06/23/22 0818   carvedilol (COREG) tablet 3.125 mg  3.125 mg Oral BID WC Creig Hines, NP   3.125 mg at 06/23/22 0820   ceFEPIme (MAXIPIME) 2 g in sodium chloride 0.9 % 100 mL IVPB  2 g Intravenous Q12H Meredeth Ide, MD   Stopped at 06/22/22 2232   famotidine (PEPCID) tablet 20 mg  20 mg Oral QHS Lorretta Harp, MD   20 mg at 06/22/22 2211    fentaNYL (SUBLIMAZE) injection 12.5 mcg  12.5 mcg Intravenous Q3H PRN Lorretta Harp, MD   12.5 mcg at 06/17/22 2345   fluconazole (DIFLUCAN) tablet 100 mg  100 mg Oral Daily Meredeth Ide, MD   100 mg at 06/23/22 0824   FLUoxetine (PROZAC) capsule 30 mg  30 mg Oral Daily Lorretta Harp, MD   30 mg at 06/23/22 0820   furosemide (LASIX) tablet 40 mg  40 mg Oral Daily Creig Hines, NP   40 mg at 06/23/22 0819   guaiFENesin (MUCINEX) 12 hr tablet 1,200 mg  1,200 mg Oral BID Meredeth Ide, MD   1,200 mg at 06/23/22 4742   hydrALAZINE (APRESOLINE) injection 5 mg  5 mg Intravenous Q2H PRN Lorretta Harp, MD       insulin aspart (novoLOG) injection 0-5 Units  0-5 Units Subcutaneous QHS Lorretta Harp, MD   2 Units at 06/22/22 2155   insulin aspart (novoLOG) injection 0-9  Units  0-9 Units Subcutaneous TID WC Lorretta Harp, MD   3 Units at 06/23/22 0810   insulin glargine-yfgn [facility]) injection 3 Units  3 Units Subcutaneous QHS Lorretta Harp, MD   3 Units at 06/22/22 2155   lidocaine (LIDODERM) 5 % 1 patch  1 patch Transdermal Q24H Lorretta Harp, MD   1 patch at 06/23/22 1610   melatonin tablet 2.5 mg  2.5 mg Oral QHS Lorretta Harp, MD   2.5 mg at 06/22/22 2210   multivitamin (PROSIGHT) tablet 2 tablet  2 tablet Oral Daily Drusilla Kanner, RPH   2 tablet at 06/23/22 9604   nitroGLYCERIN (NITROSTAT) SL tablet 0.4 mg  0.4 mg Sublingual Q5 min PRN Lorretta Harp, MD   0.4 mg at 06/17/22 1501   ondansetron (ZOFRAN) injection 4 mg  4 mg Intravenous Q8H PRN Lorretta Harp, MD       Oral care mouth rinse  15 mL Mouth Rinse PRN Lorretta Harp, MD       oxyCODONE-acetaminophen (PERCOCET/ROXICET) 5-325 MG per tablet 1 tablet  1 tablet Oral Q6H PRN Lorretta Harp, MD   1 tablet at 06/22/22 1709   polyethylene glycol (MIRALAX / GLYCOLAX) packet 17 g  17 g Oral Daily PRN Lorretta Harp, MD   17 g at 06/22/22 2154   polyvinyl alcohol (LIQUIFILM TEARS) 1.4 % ophthalmic solution 1 drop  1 drop Both Eyes 5 X Daily Lorretta Harp, MD   1 drop at 06/23/22 5409      Discharge Medications: Please see discharge summary for a list of discharge medications.  Relevant Imaging Results:  Relevant Lab Results:   Additional Information [id number]  Garret Reddish, RN

## 2022-06-24 DIAGNOSIS — I25118 Atherosclerotic heart disease of native coronary artery with other forms of angina pectoris: Secondary | ICD-10-CM | POA: Diagnosis not present

## 2022-06-24 DIAGNOSIS — K449 Diaphragmatic hernia without obstruction or gangrene: Secondary | ICD-10-CM | POA: Diagnosis not present

## 2022-06-24 DIAGNOSIS — J9601 Acute respiratory failure with hypoxia: Secondary | ICD-10-CM | POA: Diagnosis not present

## 2022-06-24 DIAGNOSIS — Z515 Encounter for palliative care: Secondary | ICD-10-CM

## 2022-06-24 DIAGNOSIS — I2489 Other forms of acute ischemic heart disease: Secondary | ICD-10-CM | POA: Diagnosis not present

## 2022-06-24 DIAGNOSIS — I214 Non-ST elevation (NSTEMI) myocardial infarction: Secondary | ICD-10-CM | POA: Diagnosis not present

## 2022-06-24 DIAGNOSIS — I5032 Chronic diastolic (congestive) heart failure: Secondary | ICD-10-CM | POA: Diagnosis not present

## 2022-06-24 LAB — GLUCOSE, CAPILLARY: Glucose-Capillary: 186 mg/dL — ABNORMAL HIGH (ref 70–99)

## 2022-06-24 MED ORDER — LORAZEPAM 2 MG/ML IJ SOLN: 1.0000 mg | INTRAMUSCULAR | Status: DC | PRN

## 2022-06-24 MED ORDER — MORPHINE SULFATE (PF) 2 MG/ML IV SOLN
1.0000 mg | INTRAVENOUS | Status: DC | PRN
  Administered 2022-06-24: 2 mg via INTRAVENOUS
  Filled 2022-06-24: qty 1

## 2022-06-24 MED ORDER — MORPHINE SULFATE (PF) 2 MG/ML IV SOLN
1.0000 mg | INTRAVENOUS | Status: DC | PRN
  Administered 2022-06-24: 1 mg via INTRAVENOUS
  Filled 2022-06-24: qty 1

## 2022-06-24 MED ORDER — ATROPINE SULFATE 1 % OP SOLN: 4.0000 [drp] | OPHTHALMIC | Status: DC | PRN

## 2022-06-24 MED ORDER — METHOCARBAMOL 500 MG PO TABS: 500.0000 mg | ORAL_TABLET | Freq: Four times a day (QID) | ORAL | Status: DC | PRN

## 2022-06-24 MED ORDER — LORAZEPAM 2 MG/ML PO CONC: 1.0000 mg | ORAL | Status: DC | PRN

## 2022-06-24 MED ORDER — HALOPERIDOL 0.5 MG PO TABS: 0.5000 mg | ORAL_TABLET | ORAL | Status: DC | PRN

## 2022-06-24 MED ORDER — POLYVINYL ALCOHOL 1.4 % OP SOLN: 1.0000 [drp] | Freq: Four times a day (QID) | OPHTHALMIC | Status: DC | PRN

## 2022-06-24 MED ORDER — FENTANYL CITRATE PF 50 MCG/ML IJ SOSY: 25.0000 ug | PREFILLED_SYRINGE | INTRAMUSCULAR | Status: DC | PRN

## 2022-06-24 MED ORDER — HALOPERIDOL LACTATE 5 MG/ML IJ SOLN: 0.5000 mg | INTRAMUSCULAR | Status: DC | PRN

## 2022-06-24 MED ORDER — LORAZEPAM 1 MG PO TABS: 1.0000 mg | ORAL_TABLET | ORAL | Status: DC | PRN

## 2022-06-24 MED ORDER — BIOTENE DRY MOUTH MT LIQD: 15.0000 mL | OROMUCOSAL | Status: DC | PRN

## 2022-06-24 MED ORDER — HALOPERIDOL LACTATE 2 MG/ML PO CONC: 0.5000 mg | ORAL | Status: DC | PRN

## 2022-06-24 MED ORDER — DIPHENHYDRAMINE HCL 50 MG/ML IJ SOLN
25.0000 mg | Freq: Four times a day (QID) | INTRAMUSCULAR | Status: DC | PRN
  Administered 2022-06-24 (×2): 25 mg via INTRAVENOUS
  Filled 2022-06-24 (×2): qty 1

## 2022-06-24 NOTE — Care Management Important Message (Signed)
Important Message  Patient Details  Name: MELLINA KASSAY MRN: [id number] Date of Birth: 06/07/1930   Medicare Important Message Given:  Other (see comment)  Disposition to discharge with hospice services.  Medicare IM withheld at this time out of respect for patient and family.     Johnell Comings 06/24/2022, 4:20 PM

## 2022-06-24 NOTE — Progress Notes (Signed)
[facility]  PROGRESS NOTE  Lydia King:[id number] DOB: [birth year] DOA: 06/16/2022 PCP: Judy Pimple, MD   Brief HPI:   89 y.o. female with medical history significant of DVT no longer on anticoagulation, CAD, stent placement, stroke, asthma, anemia, hypertension, hyperlipidemia, hiatal hernia, GIB, depression with anxiety, gastric ulcer, diastolic CHF, OSA on CPAP, aortic stenosis, who presented with chest pain.  Troponin was elevated with peak troponin 415   Cardiology consult is obtained, patient also placed on heparin drip. Patient's oxygen requirement continued to rise, currently on 40 L/min HFNC, 37% FiO2.  VQ scan showed intermediate to high probability for pulmonary embolism CTA chest could not be obtained due to the contrast allergy    Assessment/Plan:    Acute hypoxemic respiratory failure -Presented with acute hypoxemic respiratory failure requiring 40 L/min HFNC 37% FiO2 -Insetting of pulmonary embolism, acute on chronic diastolic CHF, multifocal pneumonia -Oxygen weaned off to 2 L/min via nasal cannula -CTA chest could not be obtained as patient has allergy to IV contrast -VQ scan showed multiple perfusion defects already in size in both lungs, intermediate to high probability for PE, evaluation limited without ventilation images. -Was started on heparin and switched to Eliquis -Chest x-ray obtained on 5/22 showed multifocal pneumonia, ceftriaxone was switched to vancomycin and cefepime.  -Pulmonology saw patient and did not recommend any catheter directed thrombolysis due to worsening respiratory status and high oxygen requirement.  -Patient decided to be transition to hospice, at this time antibiotics and anticoagulation with Eliquis has been discontinued   Multifocal pneumonia -Seen on chest x-ray, ceftriaxone was changed to vancomycin and cefepime -Clinically significantly improved, oxygen is being weaned off -Patient has been on vancomycin for past 4  days, will discontinue vancomycin, MRSA PCR is negative -Cefepime is now discontinued as patient is comfort care only  NSTEMI -History of CAD, presented with troponin elevation -Currently on heparin drip -Echocardiogram shows severe pulmonary hypertension -Cardiology following -IV Lasix changed to p.o. Lasix -P.o. Lasix discontinued  Diabetes mellitus type 2 -Discontinue sliding scale with insulin -Continue comfort measures  ?  Pulmonary embolism -VQ scan shows intermediate hypermobility for PE -Multiple perfusion defects in both lung fields -Evaluation limited without ventilation images -Unable to obtain CTA chest due to contrast allergy; discussed with family that we can premedicate with Solu-Medrol however family is not very much keen on getting CTA chest due to previous history of anaphylaxis with contrast -Venous duplex of lower extremities was negative for DVT -Plan to continue with anticoagulation for presumed PE as per VQ scan -heparin was discontinued and patient started on Eliquis -Eliquis has been discontinued  Acute kidney injury -Resolved   Hypertension -Antihypertensive medications discontinued  Dysphagia -Patient had choking episode while eating -Swallow evaluation obtained -Started on dysphagia 3 diet   Goals of care discussion -This morning I had discussion with the patient her daughters including Dondra Spry who is the HCPOA.  Patient clearly stated that she did not want any aggressive interventions and want to be kept comfortable.  She will mention that she would like to be transferred to residential hospice.  Daughters are in agreement with stopping aggressive measures and all antibiotics.  Will consult TOC for residential hospice.  Will consult palliative care for end-of-life care.  Medications     apixaban  10 mg Oral BID   Followed by   Melene Muller ON 06/27/2022] apixaban  5 mg Oral BID   aspirin EC  81 mg Oral Daily   baclofen  5 mg Oral  Daily   calcium  carbonate  400 mg of elemental calcium Oral TID WC   carvedilol  3.125 mg Oral BID WC   famotidine  20 mg Oral QHS   fluconazole  100 mg Oral Daily   FLUoxetine  30 mg Oral Daily   furosemide  40 mg Oral Daily   guaiFENesin  1,200 mg Oral BID   insulin aspart  0-5 Units Subcutaneous QHS   insulin aspart  0-9 Units Subcutaneous TID WC   insulin glargine-yfgn  10 Units Subcutaneous QHS   lidocaine  1 patch Transdermal Q24H   melatonin  2.5 mg Oral QHS   multivitamin  2 tablet Oral Daily   polyvinyl alcohol  1 drop Both Eyes 5 X Daily     Data Reviewed:   CBG:  Recent Labs  Lab 06/23/22 0730 06/23/22 1154 06/23/22 1525 06/23/22 2059 06/24/22 0744  GLUCAP 237* 227* 274* 130* 186*    SpO2: 99 % O2 Flow Rate (L/min): 3 L/min FiO2 (%): 35 %    Vitals:   06/24/22 0055 06/24/22 0419 06/24/22 0500 06/24/22 0742  BP: (!) 147/75 137/67  (!) 133/47  Pulse: 84 81  85  Resp: 19 17  16   Temp: 98.5 F (36.9 C) 99.1 F (37.3 C)  99.7 F (37.6 C)  TempSrc: Oral   Oral  SpO2: 100% 98%  99%  Weight:   60.3 kg   Height:          Data Reviewed:  Basic Metabolic Panel: Recent Labs  Lab 06/18/22 0418 06/19/22 0612 06/20/22 0820 06/21/22 0653 06/22/22 0357 06/23/22 1025  NA 128* 129* 127* 129* 126* 126*  K 3.7 3.5 3.2* 4.8 3.9 3.6  CL 97* 100 97* 100 96* 95*  CO2 23 19* 21* 23 23 24   GLUCOSE 259* 203* 220* 245* 262* 282*  BUN 50* 46* 28* 22 26* 20  CREATININE 1.07* 0.94 0.69 0.63 0.62 0.70  CALCIUM 9.2 9.5 8.9 8.9 8.6* 8.5*  MG 2.5*  --   --   --   --   --     CBC: Recent Labs  Lab 06/18/22 0418 06/19/22 0612 06/20/22 0410 06/22/22 0357 06/23/22 1025  WBC 9.8 12.6* 11.3* 18.1* 14.5*  HGB 11.2* 11.3* 11.5* 10.6* 10.5*  HCT 32.9* 33.9* 33.7* 31.2* 31.2*  MCV 92.7 92.9 90.6 91.0 91.2  PLT 136* 160 174 224 284    LFT Recent Labs  Lab 06/19/22 0612 06/20/22 0820  AST 9* 10*  ALT 22 17  ALKPHOS 74 82  BILITOT 1.2 1.1  PROT 5.2* 5.4*  ALBUMIN 2.2*  2.1*     Antibiotics: Anti-infectives (From admission, onward)    Start     Dose/Rate Route Frequency Ordered Stop   06/20/22 1800  vancomycin (VANCOCIN) IVPB 1000 mg/200 mL premix  Status:  Discontinued        1,000 mg 200 mL/hr over 60 Minutes Intravenous Every 48 hours 06/18/22 1715 06/20/22 0950   06/20/22 1100  vancomycin (VANCOCIN) IVPB 1000 mg/200 mL premix  Status:  Discontinued        1,000 mg 200 mL/hr over 60 Minutes Intravenous Every 36 hours 06/20/22 0950 06/21/22 1155   06/20/22 0945  ceFEPIme (MAXIPIME) 2 g in sodium chloride 0.9 % 100 mL IVPB        2 g 200 mL/hr over 30 Minutes Intravenous Every 12 hours 06/20/22 0932     06/18/22 1800  ceFEPIme (MAXIPIME) 2 g in sodium chloride 0.9 % 100  mL IVPB  Status:  Discontinued        2 g 200 mL/hr over 30 Minutes Intravenous Every 24 hours 06/18/22 1711 06/20/22 0932   06/18/22 1800  vancomycin (VANCOREADY) IVPB 1500 mg/300 mL        1,500 mg 150 mL/hr over 120 Minutes Intravenous  Once 06/18/22 1712 06/18/22 2136   06/18/22 1000  fluconazole (DIFLUCAN) tablet 100 mg        100 mg Oral Daily 06/18/22 0926     06/17/22 2200  fluconazole (DIFLUCAN) tablet 100 mg  Status:  Discontinued        100 mg Oral Every 48 hours 06/16/22 2056 06/18/22 0926   06/17/22 2000  cefTRIAXone (ROCEPHIN) 2 g in sodium chloride 0.9 % 100 mL IVPB  Status:  Discontinued        2 g 200 mL/hr over 30 Minutes Intravenous Every 24 hours 06/17/22 1819 06/18/22 1703        DVT prophylaxis: Heparin  Code Status: DNR  Family Communication: Discussed with patient's sister at bedside   CONSULTS cardiology   Subjective   This morning patient expressed her desire that she is tired of shortness of breath and pain and being in the hospital.  She would like to be transition to hospice.   Objective    Physical Examination:  Appears in mild respiratory distress Tachypnea, bilateral rhonchi auscultated S1-S2, regular Abdomen is soft,  nontender, no organomegaly  Status is: Inpatient:          Meredeth Ide   [facility] If 7PM-7AM, please contact night-coverage at [email or web address], Office  [phone]   06/24/2022, 8:02 AM  LOS: 8 days

## 2022-06-24 NOTE — TOC Transition Note (Signed)
Transition of Care [facility]) - CM/SW Discharge Note   Patient Details  Name: Lydia King MRN: [id number] Date of Birth: July 01, 1930  Transition of [facility]) CM/SW Contact:  Margarito Liner, LCSW Phone Number: 06/24/2022, 4:19 PM   Clinical Narrative:   Patient has orders to discharge to the [facility] today. RN will call report. [facility] liaison will arrange EMS transport. Transport paperwork is in the discharge packet. No further concerns. CSW signing off.  Final next level of care: Hospice Medical Facility Barriers to Discharge: Barriers Resolved   Patient Goals and CMS Choice   Choice offered to / list presented to : Adult Children  Discharge Placement                Patient chooses bed at: Other - please specify in the comment section below: [facility]) Patient to be transferred to facility by: EMS   Patient and family notified of of transfer: 06/24/22  Discharge Plan and Services Additional resources added to the After Visit Summary for       Post Acute Care Choice: Resumption of Svcs/PTA Provider                               Social Determinants of Health (SDOH) Interventions SDOH Screenings   Food Insecurity: No Food Insecurity (06/17/2022)  Housing: Low Risk  (06/17/2022)  Transportation Needs: No Transportation Needs (06/17/2022)  Utilities: Not At Risk (06/17/2022)  Depression (PHQ2-9): Low Risk  (10/31/2019)  Tobacco Use: Low Risk  (06/17/2022)     Readmission Risk Interventions    06/18/2022    1:45 PM  Readmission Risk Prevention Plan  PCP or Specialist Appt within 3-5 Days Complete  Social Work Consult for Recovery Care Planning/Counseling Complete  Palliative Care Screening Not Applicable  Medication Review [employer]) Complete

## 2022-06-24 NOTE — Progress Notes (Signed)
[facility] ([facility])    Received request from Transitions of Care Manger for family interest in [facility].  Eligibility has been confirmed.  Met/Spoke with patient and  family to confirm interest and explain services.  Family agreeable to transfer today.  Transitions of Care manager aware.  RN please call report to [phone] (Hospice Home) prior to patient leaving the unit.  Please send signed and completed DNR with patient at discharge.  Thank you  Jack Crater,  Hospital Hospice Liaison [phone] 

## 2022-06-24 NOTE — Progress Notes (Signed)
Rounding Note    Patient Name: Lydia King Date of Encounter: 06/24/2022  [facility] Cardiologist: Julien Nordmann, MD   Subjective   Patient is on 3L O2. Accompanied by family, who have lots of questions regarding long-term care. Patient reports constant chest discomfort on the left side, worse with taking a deep breath.   Inpatient Medications    Scheduled Meds:  apixaban  10 mg Oral BID   Followed by   Melene Muller ON 06/27/2022] apixaban  5 mg Oral BID   aspirin EC  81 mg Oral Daily   baclofen  5 mg Oral Daily   calcium carbonate  400 mg of elemental calcium Oral TID WC   carvedilol  3.125 mg Oral BID WC   famotidine  20 mg Oral QHS   fluconazole  100 mg Oral Daily   FLUoxetine  30 mg Oral Daily   furosemide  40 mg Oral Daily   guaiFENesin  1,200 mg Oral BID   insulin aspart  0-5 Units Subcutaneous QHS   insulin aspart  0-9 Units Subcutaneous TID WC   insulin glargine-yfgn  10 Units Subcutaneous QHS   lidocaine  1 patch Transdermal Q24H   melatonin  2.5 mg Oral QHS   multivitamin  2 tablet Oral Daily   polyvinyl alcohol  1 drop Both Eyes 5 X Daily   Continuous Infusions:  ceFEPime (MAXIPIME) IV 2 g (06/23/22 2235)   PRN Meds: acetaminophen, albuterol, alum & mag hydroxide-simeth, fentaNYL (SUBLIMAZE) injection, hydrALAZINE, nitroGLYCERIN, ondansetron (ZOFRAN) IV, mouth rinse, oxyCODONE-acetaminophen, polyethylene glycol   Vital Signs    Vitals:   06/24/22 0055 06/24/22 0419 06/24/22 0500 06/24/22 0742  BP: (!) 147/75 137/67  (!) 133/47  Pulse: 84 81  85  Resp: 19 17  16   Temp: 98.5 F (36.9 C) 99.1 F (37.3 C)  99.7 F (37.6 C)  TempSrc: Oral   Oral  SpO2: 100% 98%  99%  Weight:   60.3 kg   Height:        Intake/Output Summary (Last 24 hours) at 06/24/2022 1123 Last data filed at 06/24/2022 0100 Gross per 24 hour  Intake 200 ml  Output 650 ml  Net -450 ml      06/24/2022    5:00 AM 06/22/2022    5:18 AM 06/21/2022    3:40 AM  Last 3  Weights  Weight (lbs) 132 lb 15 oz 133 lb 4.8 oz 131 lb 3.2 oz  Weight (kg) 60.3 kg 60.464 kg 59.512 kg      Telemetry    NSR HR 80s - Personally Reviewed  ECG    No new - Personally Reviewed  Physical Exam   GEN: No acute distress.   Neck: No JVD Cardiac: RRR, no murmurs, rubs, or gallops.  Respiratory: wheezing with diminished breath sounds GI: Soft, nontender, non-distended  MS: No edema; No deformity. Neuro:  Nonfocal  Psych: Normal affect   Labs    High Sensitivity Troponin:   Recent Labs  Lab 06/16/22 1454 06/16/22 1703 06/16/22 2122 06/17/22 0104 06/17/22 0316  TROPONINIHS 415* 398* 307* 277* 278*     Chemistry Recent Labs  Lab 06/18/22 1610 06/19/22 0612 06/20/22 0820 06/21/22 0653 06/22/22 0357 06/23/22 1025  NA 128* 129* 127* 129* 126* 126*  K 3.7 3.5 3.2* 4.8 3.9 3.6  CL 97* 100 97* 100 96* 95*  CO2 23 19* 21* 23 23 24   GLUCOSE 259* 203* 220* 245* 262* 282*  BUN 50* 46* 28* 22  26* 20  CREATININE 1.07* 0.94 0.69 0.63 0.62 0.70  CALCIUM 9.2 9.5 8.9 8.9 8.6* 8.5*  MG 2.5*  --   --   --   --   --   PROT  --  5.2* 5.4*  --   --   --   ALBUMIN  --  2.2* 2.1*  --   --   --   AST  --  9* 10*  --   --   --   ALT  --  22 17  --   --   --   ALKPHOS  --  74 82  --   --   --   BILITOT  --  1.2 1.1  --   --   --   GFRNONAA 50* 58* >60 >60 >60 >60  ANIONGAP 8 10 9 6 7 7     Lipids No results for input(s): "CHOL", "TRIG", "HDL", "LABVLDL", "LDLCALC", "CHOLHDL" in the last 168 hours.  Hematology Recent Labs  Lab 06/20/22 0410 06/22/22 0357 06/23/22 1025  WBC 11.3* 18.1* 14.5*  RBC 3.72* 3.43* 3.42*  HGB 11.5* 10.6* 10.5*  HCT 33.7* 31.2* 31.2*  MCV 90.6 91.0 91.2  MCH 30.9 30.9 30.7  MCHC 34.1 34.0 33.7  RDW 12.9 13.2 13.5  PLT 174 224 284   Thyroid No results for input(s): "TSH", "FREET4" in the last 168 hours.  BNPNo results for input(s): "BNP", "PROBNP" in the last 168 hours.  DDimer No results for input(s): "DDIMER" in the last 168 hours.    Radiology    No results found.  Cardiac Studies   2D Echocardiogram 5.21.2024    1. Left ventricular ejection fraction, by estimation, is 65 to 70%. The  left ventricle has normal function. Left ventricular endocardial border  not optimally defined to evaluate regional wall motion. Left ventricular  diastolic parameters are consistent  with Grade II diastolic dysfunction (pseudonormalization). Elevated left  atrial pressure.   2. Right ventricular systolic function is normal. The right ventricular  size is normal. There is severely elevated pulmonary artery systolic  pressure. The estimated right ventricular systolic pressure is 63.1 mmHg.   3. Left atrial size was moderately dilated.   4. The mitral valve is degenerative. Trivial mitral valve regurgitation.  No evidence of mitral stenosis. Moderate mitral annular calcification.   5. Tricuspid valve regurgitation is moderate to severe.   6. The aortic valve has an indeterminant number of cusps. There is severe  calcifcation of the aortic valve. There is severe thickening of the aortic  valve. Aortic valve regurgitation is not visualized. There is at least  mild to moderate aortic stenosis,   though degree of stenosis appears more severe by visual estimation. Poor  Doppler envelopes likely underestimate the aortic valve gradient.   7. Pulmonic valve regurgitation not well assessed.   8. The inferior vena cava is normal in size with <50% respiratory  variability, suggesting right atrial pressure of 8 mmHg.     Patient Profile     89 y.o. female  with past medical history of coronary disease status post PCI to LAD and circumflex May 2021, moderate aortic stenosis, HFpEF, hypertension, hyperlipidemia, type 2 diabetes, stroke, hiatal hernia, anemia, asthma, and history of DVT, who is being seen and evaluated for chest pain and elevated high-sensitivity troponin in the setting of presumed PE.   Assessment & Plan    Acute hypoxic  respiratory failure Multifocal PNA PE - VQ scan with intermediate to high probability of  PE.  - no CT given contrast allergy and AKI - pulmonology saw and did not recommend intervention - on Eliquis - she remains on supplemental O2 - diffuse wheezing with diminished breath sounds - per IM  Severe PAH Acute HFpEF - EF 65-70% with RSVP 63.60mmHg - changed to oral lasix 5/25 - breathing stable on [place], trying to wean O2, now on 2-3L - appears euvolemic on exam - continue Coreg - poor candidate for SGLT2i  Pleuritic chest pain Demand ischemia - HS trop elevated to 398 on admission - Echo showed nl EF and PAH - No chest pain this AM - in the setting of resp failure/PE/PNA - abx per IM  Valvular heart disease - mod to severe TR - mild to mod AS  Hyponatremia - Na 126 - daily BMET  GOC - family is wondering about palliative consult  For questions or updates, please contact [facility] Please consult [email or web address] for contact info under        Signed, Baylei Siebels David Stall, PA-C  06/24/2022, 11:23 AM

## 2022-06-24 NOTE — Discharge Summary (Signed)
Physician Discharge Summary   Patient: Lydia King MRN: [id number] DOB: 11/19/1930  Admit date:     06/16/2022  Discharge date: 06/24/22  Discharge Physician: Meredeth Ide   PCP: Judy Pimple, MD   Recommendations at discharge:   Patient to discharge to residential hospice  Discharge Diagnoses: Principal Problem:   NSTEMI (non-ST elevated myocardial infarction) [facility]) Active Problems:   CAD (coronary artery disease)   Chronic diastolic CHF (congestive heart failure) (HCC)   Essential hypertension   History of CVA (cerebrovascular accident)   HLD (hyperlipidemia)   Asthma   Type 2 diabetes mellitus without complications (HCC)   Leukocytosis   Elevated lactic acid level   AKI (acute kidney injury) (HCC)   H/O gastroesophageal reflux (GERD)   Depression with anxiety   Large hiatal hernia   Moderate aortic stenosis   Hyponatremia   Hypotension   Acute respiratory failure with hypoxia (HCC)   Elevated troponin   Pulmonary hypertension, unspecified (HCC)   Demand ischemia  Resolved Problems:   * No resolved hospital problems. *  Hospital Course:  89 y.o. female with medical history significant of DVT no longer on anticoagulation, CAD, stent placement, stroke, asthma, anemia, hypertension, hyperlipidemia, hiatal hernia, GIB, depression with anxiety, gastric ulcer, diastolic CHF, OSA on CPAP, aortic stenosis, who presented with chest pain.  Troponin was elevated with peak troponin 415   Cardiology consult is obtained, patient also placed on heparin drip. Patient's oxygen requirement continued to rise, currently on 40 L/min HFNC, 37% FiO2.  VQ scan showed intermediate to high probability for pulmonary embolism CTA chest could not be obtained due to the contrast allergy  Assessment and Plan:  Acute hypoxemic respiratory failure -Presented with acute hypoxemic respiratory failure requiring 40 L/min HFNC 37% FiO2 -Insetting of pulmonary embolism, acute on chronic  diastolic CHF, multifocal pneumonia -Oxygen weaned off to 2 L/min via nasal cannula -CTA chest could not be obtained as patient has allergy to IV contrast -VQ scan showed multiple perfusion defects already in size in both lungs, intermediate to high probability for PE, evaluation limited without ventilation images. -Was started on heparin and switched to Eliquis -Chest x-ray obtained on 5/22 showed multifocal pneumonia, ceftriaxone was switched to vancomycin and cefepime.  -Pulmonology saw patient and did not recommend any catheter directed thrombolysis due to worsening respiratory status and high oxygen requirement.  -Patient decided to be transition to hospice, at this time antibiotics and anticoagulation with Eliquis has been discontinued     Multifocal pneumonia -Seen on chest x-ray, ceftriaxone was changed to vancomycin and cefepime -Clinically significantly improved, oxygen is being weaned off -Patient has been on vancomycin for past 4 days, will discontinue vancomycin, MRSA PCR is negative -Cefepime is now discontinued as patient is comfort care only   NSTEMI -History of CAD, presented with troponin elevation -Currently on heparin drip -Echocardiogram shows severe pulmonary hypertension -Cardiology following -IV Lasix changed to p.o. Lasix -P.o. Lasix discontinued   Diabetes mellitus type 2 -Discontinue sliding scale with insulin -Continue comfort measures   ?  Pulmonary embolism -VQ scan shows intermediate hypermobility for PE -Multiple perfusion defects in both lung fields -Evaluation limited without ventilation images -Unable to obtain CTA chest due to contrast allergy; discussed with family that we can premedicate with Solu-Medrol however family is not very much keen on getting CTA chest due to previous history of anaphylaxis with contrast -Venous duplex of lower extremities was negative for DVT -Plan to continue with anticoagulation for presumed PE as  per VQ  scan -heparin was discontinued and patient started on Eliquis -Eliquis has been discontinued   Acute kidney injury -Resolved     Hypertension -Antihypertensive medications discontinued   Dysphagia -Patient had choking episode while eating -Swallow evaluation obtained -Started on dysphagia 3 diet -Now on comfort diet     Goals of care discussion -This morning I had discussion with the patient her daughters including Dondra Spry who is the HCPOA.  Patient clearly stated that she did not want any aggressive interventions and want to be kept comfortable.  She will mention that she would like to be transferred to residential hospice.  Daughters are in agreement with stopping aggressive measures and all antibiotics.  Patient has been accepted at residential hospice.          Consultants: Cardiology, pulmonology, palliative care Procedures performed:  Disposition: Hospice care Diet recommendation:  Discharge Diet Orders (From admission, onward)     Start     Ordered   06/24/22 0000  Diet - low sodium heart healthy        06/24/22 1559           Regular diet DISCHARGE MEDICATION: Allergies as of 06/24/2022       Reactions   Morphine And Codeine Anaphylaxis   Other Shortness Of Breath, Other (See Comments)   Shrimp [shellfish Allergy] Anaphylaxis   Anaphylaxis-swelling of throat/sob    Amlodipine    Amlodipine Besylate    REACTION: edema   Atorvastatin    REACTION: pain- could not walk   Ciprofloxacin Other (See Comments)   REACTION: unkown Trouble breathing   Fentanyl    Influenza Vaccines Other (See Comments)   Arm swelling, redness, chills, fever   Iodinated Contrast Media    Naproxen Other (See Comments)   Throat swelling   Nitrofurantoin    Omeprazole    REACTION: not effective   Pantoprazole Sodium    REACTION: does not work well   Pantoprazole Sodium    Penicillin G Hives   ITCHY RASH   Penicillins    REACTION: itchy rash   Simvastatin    REACTION:  leg pain   Sulfa Antibiotics    Sulfonamide Derivatives    REACTION: itchy rash   Codeine Rash   REACTION: itchy rash ITCHY RASH, also breathing trouble   Prednisone Other (See Comments)   REACTION: RASH.// SHORTNESS OF BREATH Prior possible flushing reaction in face, but tolerated it in 2020 and 2021 up to 20mg         Medication List     STOP taking these medications    Artificial Tears 0.2-0.2-1 % Soln Generic drug: Glycerin-Hypromellose-PEG 400   aspirin EC 81 MG tablet   Baclofen 5 MG Tabs   calcium carbonate 500 MG chewable tablet Commonly known as: TUMS - dosed in mg elemental calcium   Carboxymethylcellulose Sod PF 1 % Gel   carvedilol 6.25 MG tablet Commonly known as: COREG   dexlansoprazole 60 MG capsule Commonly known as: DEXILANT   diclofenac Sodium 1 % Gel Commonly known as: VOLTAREN   famotidine 20 MG tablet Commonly known as: PEPCID   fluconazole 100 MG tablet Commonly known as: DIFLUCAN   FLUoxetine 10 MG capsule Commonly known as: PROZAC   furosemide 40 MG tablet Commonly known as: LASIX   insulin glargine 100 UNIT/ML injection Commonly known as: LANTUS   insulin lispro 100 UNIT/ML injection Commonly known as: HUMALOG   isosorbide mononitrate 30 MG 24 hr tablet Commonly known as: IMDUR  Janumet XR [phone] MG Tb24 Generic drug: SitaGLIPtin-MetFORMIN HCl   Lidocaine HCl 4 % Ptch   lisinopril 20 MG tablet Commonly known as: ZESTRIL   Lysine 500 MG Caps   magic mouthwash Soln   melatonin 3 MG Tabs tablet   oxyCODONE-acetaminophen 5-325 MG tablet Commonly known as: PERCOCET/ROXICET   polyethylene glycol 17 g packet Commonly known as: MIRALAX / GLYCOLAX   PreserVision AREDS 2 Caps   traMADol 50 MG tablet Commonly known as: ULTRAM   Trulicity 0.75 MG/0.5ML Sopn Generic drug: Dulaglutide       TAKE these medications    acetaminophen 325 MG tablet Commonly known as: TYLENOL Take 975 mg by mouth 3 (three) times  daily.        Discharge Exam: Filed Weights   06/21/22 0340 06/22/22 0518 06/24/22 0500  Weight: 59.5 kg 60.5 kg 60.3 kg   Appears in no acute distress S1-S2, regular Lungs, scattered wheezing  Condition at discharge: fair  The results of significant diagnostics from this hospitalization (including imaging, microbiology, ancillary and laboratory) are listed below for reference.   Imaging Studies: US Venous Img Lower Bilateral (DVT)  Result Date: 06/19/2022 CLINICAL DATA:  Bilateral lower extremity pain, indeterminate duration EXAM: BILATERAL LOWER EXTREMITY VENOUS DOPPLER ULTRASOUND TECHNIQUE: Gray-scale sonography with graded compression, as well as color Doppler and duplex ultrasound were performed to evaluate the lower extremity deep venous systems from the level of the common femoral vein and including the common femoral, femoral, profunda femoral, popliteal and calf veins including the posterior tibial, peroneal and gastrocnemius veins when visible. The superficial great saphenous vein was also interrogated. Spectral Doppler was utilized to evaluate flow at rest and with distal augmentation maneuvers in the common femoral, femoral and popliteal veins. COMPARISON:  None Available. FINDINGS: RIGHT LOWER EXTREMITY Common Femoral Vein: No evidence of thrombus. Normal compressibility, respiratory phasicity and response to augmentation. Saphenofemoral Junction: No evidence of thrombus. Normal compressibility and flow on color Doppler imaging. Profunda Femoral Vein: No evidence of thrombus. Normal compressibility and flow on color Doppler imaging. Femoral Vein: No evidence of thrombus. Normal compressibility, respiratory phasicity and response to augmentation. Popliteal Vein: No evidence of thrombus. Normal compressibility, respiratory phasicity and response to augmentation. Calf Veins: No evidence of thrombus. Normal compressibility and flow on color Doppler imaging. Superficial Great Saphenous  Vein: No evidence of thrombus. Normal compressibility. Venous Reflux:  None. Other Findings:  None. LEFT LOWER EXTREMITY Common Femoral Vein: No evidence of thrombus. Normal compressibility, respiratory phasicity and response to augmentation. Saphenofemoral Junction: No evidence of thrombus. Normal compressibility and flow on color Doppler imaging. Profunda Femoral Vein: No evidence of thrombus. Normal compressibility and flow on color Doppler imaging. Femoral Vein: No evidence of thrombus. Normal compressibility, respiratory phasicity and response to augmentation. Popliteal Vein: No evidence of thrombus. Normal compressibility, respiratory phasicity and response to augmentation. Calf Veins: No evidence of thrombus. Normal compressibility and flow on color Doppler imaging. Superficial Great Saphenous Vein: No evidence of thrombus. Normal compressibility. Venous Reflux:  None. Other Findings:  None. IMPRESSION: No evidence of deep venous thrombosis in either lower extremity. Electronically Signed   By: Malachy Moan M.D.   On: 06/19/2022 12:54   DG Chest Port 1 View  Result Date: 06/18/2022 CLINICAL DATA:  Pneumonia EXAM: PORTABLE CHEST 1 VIEW COMPARISON:  06/16/2022 FINDINGS: Single frontal view of the chest demonstrates stable enlargement of the cardiac silhouette. Since the prior exam, dense areas of consolidation of developed in the left upper lobe compatible with pneumonia.  There is more patchy airspace disease elsewhere within the lungs. Continued hiatal hernia. No effusion or pneumothorax. No acute bony abnormalities. IMPRESSION: 1. Interval development of airspace disease, with dense left upper lobe consolidation most evident. Findings are consistent with multifocal pneumonia, though underlying sequela of pulmonary infarct cannot be excluded given recent V/Q scan findings. Electronically Signed   By: Sharlet Salina M.D.   On: 06/18/2022 15:08   NM Pulmonary Perfusion  Result Date:  06/17/2022 CLINICAL DATA:  Shortness of breath, high clinical suspicion for PE EXAM: NUCLEAR MEDICINE PERFUSION LUNG SCAN TECHNIQUE: Perfusion images were obtained in multiple projections after intravenous injection of radiopharmaceutical. Ventilation scans intentionally deferred if perfusion scan and chest x-ray adequate for interpretation during COVID 19 epidemic. RADIOPHARMACEUTICALS:  3.99 mCi Tc-84m MAA IV COMPARISON:  Chest radiograph done on 06/16/2022 FINDINGS: There are multiple perfusion defects of varying sizes in both lungs. Some of the defects appear to be which shaped. In the chest radiograph done on 06/16/2022, no focal pulmonary infiltrates were noted. Evaluation is limited without ventilation images. IMPRESSION: There are multiple perfusion defects all vein sizes in both lungs. Some of the defects show wedge shape. There are no matching infiltrates in the lung fields in the chest radiograph done on 06/16/2022. Findings suggest intermediate to high probability for PE. Evaluation is limited without ventilation images. If clinically feasible, CT pulmonary angiogram should be considered. Electronically Signed   By: Ernie Avena M.D.   On: 06/17/2022 14:57   ECHOCARDIOGRAM COMPLETE  Result Date: 06/17/2022    ECHOCARDIOGRAM REPORT   Patient Name:   TILISHA TUFFY Date of Exam: 06/17/2022 Medical Rec #:  [id number]        Height:       58.0 in Accession #:    [id number]       Weight:       127.9 lb Date of Birth:  [birth year]        BSA:          1.506 m Patient Age:    89 years         BP:           90/45 mmHg Patient Gender: F                HR:           81 bpm. Exam Location:  [facility] Procedure: 2D Echo, Cardiac Doppler and Color Doppler Indications:     Chest pain R07.9                  Aortic stenosis I35.0  History:         Patient has no prior history of Echocardiogram examinations.                  CAD; Risk Factors:Hypertension and Dyslipidemia.  Sonographer:     Cristela Blue Referring  Phys:  [id number] SHERI HAMMOCK Diagnosing Phys: Cristal Deer End MD  Sonographer Comments: Technically challenging study due to limited acoustic windows and suboptimal apical window. IMPRESSIONS  1. Left ventricular ejection fraction, by estimation, is 65 to 70%. The left ventricle has normal function. Left ventricular endocardial border not optimally defined to evaluate regional wall motion. Left ventricular diastolic parameters are consistent with Grade II diastolic dysfunction (pseudonormalization). Elevated left atrial pressure.  2. Right ventricular systolic function is normal. The right ventricular size is normal. There is severely elevated pulmonary artery systolic pressure. The estimated right ventricular systolic pressure is 63.1 mmHg.  3. Left  atrial size was moderately dilated.  4. The mitral valve is degenerative. Trivial mitral valve regurgitation. No evidence of mitral stenosis. Moderate mitral annular calcification.  5. Tricuspid valve regurgitation is moderate to severe.  6. The aortic valve has an indeterminant number of cusps. There is severe calcifcation of the aortic valve. There is severe thickening of the aortic valve. Aortic valve regurgitation is not visualized. There is at least mild to moderate aortic stenosis,  though degree of stenosis appears more severe by visual estimation. Poor Doppler envelopes likely underestimate the aortic valve gradient.  7. Pulmonic valve regurgitation not well assessed.  8. The inferior vena cava is normal in size with <50% respiratory variability, suggesting right atrial pressure of 8 mmHg. FINDINGS  Left Ventricle: Left ventricular ejection fraction, by estimation, is 65 to 70%. The left ventricle has normal function. Left ventricular endocardial border not optimally defined to evaluate regional wall motion. The left ventricular internal cavity size was normal in size. There is no left ventricular hypertrophy. Left ventricular diastolic parameters are consistent  with Grade II diastolic dysfunction (pseudonormalization). Elevated left atrial pressure. Right Ventricle: The right ventricular size is normal. No increase in right ventricular wall thickness. Right ventricular systolic function is normal. There is severely elevated pulmonary artery systolic pressure. The tricuspid regurgitant velocity is 3.71 m/s, and with an assumed right atrial pressure of 8 mmHg, the estimated right ventricular systolic pressure is 63.1 mmHg. Left Atrium: Left atrial size was moderately dilated. Right Atrium: Right atrial size was normal in size. Pericardium: The pericardium was not well visualized. Mitral Valve: The mitral valve is degenerative in appearance. Moderate mitral annular calcification. Trivial mitral valve regurgitation. No evidence of mitral valve stenosis. MV peak gradient, 8.9 mmHg. The mean mitral valve gradient is 3.0 mmHg. Tricuspid Valve: The tricuspid valve is normal in structure. Tricuspid valve regurgitation is moderate to severe. Aortic Valve: The aortic valve has an indeterminant number of cusps. There is severe calcifcation of the aortic valve. There is severe thickening of the aortic valve. Aortic valve regurgitation is not visualized. There is at least mild to moderate aortic  stenosis, though degree of stenosis appears more severe by visual estimation. Poor Doppler envelopes likely underestimate the aortic valve gradient. Aortic valve mean gradient measures 12.0 mmHg. Aortic valve peak gradient measures 21.1 mmHg. Aortic valve area, by VTI measures 1.33 cm. Pulmonic Valve: The pulmonic valve was not well visualized. Pulmonic valve regurgitation not well assessed. Aorta: The aortic root was not well visualized. Pulmonary Artery: The pulmonary artery is not well seen. Venous: The inferior vena cava is normal in size with less than 50% respiratory variability, suggesting right atrial pressure of 8 mmHg. IAS/Shunts: The interatrial septum was not well visualized.  LEFT  VENTRICLE PLAX 2D LVIDd:         2.90 cm   Diastology LVIDs:         2.10 cm   LV e' medial:    6.85 cm/s LV PW:         0.90 cm   LV E/e' medial:  18.4 LV IVS:        0.90 cm   LV e' lateral:   8.05 cm/s LVOT diam:     2.00 cm   LV E/e' lateral: 15.7 LV SV:         53 LV SV Index:   35 LVOT Area:     3.14 cm  RIGHT VENTRICLE RV Basal diam:  3.70 cm RV Mid diam:  3.40 cm LEFT ATRIUM             Index        RIGHT ATRIUM           Index LA diam:        4.30 cm 2.86 cm/m   RA Area:     15.10 cm LA Vol (A2C):   73.0 ml 48.48 ml/m  RA Volume:   36.20 ml  24.04 ml/m LA Vol (A4C):   69.2 ml 45.96 ml/m LA Biplane Vol: 71.5 ml 47.48 ml/m  AORTIC VALVE AV Area (Vmax):    1.08 cm AV Area (Vmean):   1.04 cm AV Area (VTI):     1.33 cm AV Vmax:           229.60 cm/s AV Vmean:          157.200 cm/s AV VTI:            0.400 m AV Peak Grad:      21.1 mmHg AV Mean Grad:      12.0 mmHg LVOT Vmax:         79.00 cm/s LVOT Vmean:        51.800 cm/s LVOT VTI:          0.170 m LVOT/AV VTI ratio: 0.42 MITRAL VALVE                TRICUSPID VALVE MV Area (PHT): 3.83 cm     TR Peak grad:   55.1 mmHg MV Area VTI:   1.59 cm     TR Vmax:        371.00 cm/s MV Peak grad:  8.9 mmHg MV Mean grad:  3.0 mmHg     SHUNTS MV Vmax:       1.49 m/s     Systemic VTI:  0.17 m MV Vmean:      86.2 cm/s    Systemic Diam: 2.00 cm MV Decel Time: 198 msec MV E velocity: 126.00 cm/s MV A velocity: 96.40 cm/s MV E/A ratio:  1.31 Yvonne Kendall MD Electronically signed by Yvonne Kendall MD Signature Date/Time: 06/17/2022/12:56:27 PM    Final    DG Chest Port 1 View  Result Date: 06/16/2022 CLINICAL DATA:  Upper abdomen pain EXAM: PORTABLE CHEST 1 VIEW COMPARISON:  06/16/2022, 12/11/2021 FINDINGS: Redemonstrated large hiatal hernia. Cardiac silhouette is probably enlarged. No acute airspace disease or effusion. No pneumothorax. IMPRESSION: No active disease. Large hiatal hernia. Electronically Signed   By: Jasmine Pang M.D.   On: 06/16/2022 22:35    DG Chest 2 View  Result Date: 06/16/2022 CLINICAL DATA:  Chest pain. EXAM: CHEST - 2 VIEW COMPARISON:  December 11, 2021. FINDINGS: Stable cardiomediastinal silhouette. Large hiatal hernia is again noted. No acute pulmonary disease is noted. Degenerative changes seen involving the right glenohumeral joint. IMPRESSION: No acute abnormality seen.  Large hiatal hernia. Electronically Signed   By: Lupita Raider M.D.   On: 06/16/2022 15:11    Microbiology: Results for orders placed or performed during the hospital encounter of 06/16/22  Culture, blood (Routine X 2) w Reflex to ID Panel     Status: Abnormal   Collection Time: 06/16/22  8:18 PM   Specimen: BLOOD  Result Value Ref Range Status   Specimen Description   Final    BLOOD BLOOD RIGHT HAND Performed at [facility], [address] [place], [place] [address]    Special Requests   Final    BOTTLES DRAWN  AEROBIC AND ANAEROBIC Blood Culture results may not be optimal due to an excessive volume of blood received in culture bottles Performed at [facility], [address] [place], [place] [address]    Culture  Setup Time   Final    GRAM POSITIVE COCCI IN PEDIATRIC BOTTLE CRITICAL RESULT CALLED TO, READ BACK BY AND VERIFIED WITH: ALEX CHAPPELL AT 1804 ON 06/17/22 BY SS    Culture (A)  Final    STAPHYLOCOCCUS EPIDERMIDIS THE SIGNIFICANCE OF ISOLATING THIS ORGANISM FROM A SINGLE SET OF BLOOD CULTURES WHEN MULTIPLE SETS ARE DRAWN IS UNCERTAIN. PLEASE NOTIFY THE MICROBIOLOGY DEPARTMENT WITHIN ONE WEEK IF SPECIATION AND SENSITIVITIES ARE REQUIRED. Performed at [facility], [address], [place], [place] [address]    Report Status 06/18/2022 FINAL  Final  Blood Culture ID Panel (Reflexed)     Status: Abnormal   Collection Time: 06/16/22  8:18 PM  Result Value Ref Range Status   Enterococcus faecalis NOT DETECTED NOT DETECTED Final   Enterococcus Faecium NOT DETECTED NOT DETECTED Final   Listeria  monocytogenes NOT DETECTED NOT DETECTED Final   Staphylococcus species DETECTED (A) NOT DETECTED Final    Comment: CRITICAL RESULT CALLED TO, READ BACK BY AND VERIFIED WITH: ALEX CHAPPELL AT 1804 ON 06/17/22 BY SS    Staphylococcus aureus (BCID) NOT DETECTED NOT DETECTED Final   Staphylococcus epidermidis DETECTED (A) NOT DETECTED Final    Comment: Methicillin (oxacillin) resistant coagulase negative staphylococcus. Possible blood culture contaminant (unless isolated from more than one blood culture draw or clinical case suggests pathogenicity). No antibiotic treatment is indicated for blood  culture contaminants. CRITICAL RESULT CALLED TO, READ BACK BY AND VERIFIED WITH: ALEX CHAPPELL AT 1804 ON 06/17/22 BY SS    Staphylococcus lugdunensis NOT DETECTED NOT DETECTED Final   Streptococcus species NOT DETECTED NOT DETECTED Final   Streptococcus agalactiae NOT DETECTED NOT DETECTED Final   Streptococcus pneumoniae NOT DETECTED NOT DETECTED Final   Streptococcus pyogenes NOT DETECTED NOT DETECTED Final   A.calcoaceticus-baumannii NOT DETECTED NOT DETECTED Final   Bacteroides fragilis NOT DETECTED NOT DETECTED Final   Enterobacterales NOT DETECTED NOT DETECTED Final   Enterobacter cloacae complex NOT DETECTED NOT DETECTED Final   Escherichia coli NOT DETECTED NOT DETECTED Final   Klebsiella aerogenes NOT DETECTED NOT DETECTED Final   Klebsiella oxytoca NOT DETECTED NOT DETECTED Final   Klebsiella pneumoniae NOT DETECTED NOT DETECTED Final   Proteus species NOT DETECTED NOT DETECTED Final   Salmonella species NOT DETECTED NOT DETECTED Final   Serratia marcescens NOT DETECTED NOT DETECTED Final   Haemophilus influenzae NOT DETECTED NOT DETECTED Final   Neisseria meningitidis NOT DETECTED NOT DETECTED Final   Pseudomonas aeruginosa NOT DETECTED NOT DETECTED Final   Stenotrophomonas maltophilia NOT DETECTED NOT DETECTED Final   Candida albicans NOT DETECTED NOT DETECTED Final   Candida auris  NOT DETECTED NOT DETECTED Final   Candida glabrata NOT DETECTED NOT DETECTED Final   Candida krusei NOT DETECTED NOT DETECTED Final   Candida parapsilosis NOT DETECTED NOT DETECTED Final   Candida tropicalis NOT DETECTED NOT DETECTED Final   Cryptococcus neoformans/gattii NOT DETECTED NOT DETECTED Final   Methicillin resistance mecA/C DETECTED (A) NOT DETECTED Final    Comment: CRITICAL RESULT CALLED TO, READ BACK BY AND VERIFIED WITH: ALEX CHAPPELL AT 1804 ON 06/17/22 BY SS Performed at [facility], [address] [place], [place] [address]   Culture, blood (Routine X 2) w Reflex to ID Panel  Status: None   Collection Time: 06/17/22  3:16 AM   Specimen: BLOOD  Result Value Ref Range Status   Specimen Description BLOOD  LEFT HAND  Final   Special Requests IN PEDIATRIC BOTTLE Blood Culture adequate volume  Final   Culture   Final    NO GROWTH 5 DAYS Performed at [facility], [address], [place], [place] [address]    Report Status 06/22/2022 FINAL  Final  MRSA Next Gen by PCR, Nasal     Status: None   Collection Time: 06/18/22  7:49 PM   Specimen: Nasal Mucosa; Nasal Swab  Result Value Ref Range Status   MRSA by PCR Next Gen NOT DETECTED NOT DETECTED Final    Comment: (NOTE) The GeneXpert MRSA Assay (FDA approved for NASAL specimens only), is one component of a comprehensive MRSA colonization surveillance program. It is not intended to diagnose MRSA infection nor to guide or monitor treatment for MRSA infections. Test performance is not FDA approved in patients less than 84 years old. Performed at [facility], [address] [place], [place] [address]     Labs: CBC: Recent Labs  Lab 06/18/22 [id number] 06/19/22 0612 06/20/22 0410 06/22/22 0357 06/23/22 1025  WBC 9.8 12.6* 11.3* 18.1* 14.5*  HGB 11.2* 11.3* 11.5* 10.6* 10.5*  HCT 32.9* 33.9* 33.7* 31.2* 31.2*  MCV 92.7 92.9 90.6 91.0 91.2  PLT 136* 160 174 224 284   Basic Metabolic  Panel: Recent Labs  Lab 06/18/22 0418 06/19/22 0612 06/20/22 0820 06/21/22 0653 06/22/22 0357 06/23/22 1025  NA 128* 129* 127* 129* 126* 126*  K 3.7 3.5 3.2* 4.8 3.9 3.6  CL 97* 100 97* 100 96* 95*  CO2 23 19* 21* 23 23 24   GLUCOSE 259* 203* 220* 245* 262* 282*  BUN 50* 46* 28* 22 26* 20  CREATININE 1.07* 0.94 0.69 0.63 0.62 0.70  CALCIUM 9.2 9.5 8.9 8.9 8.6* 8.5*  MG 2.5*  --   --   --   --   --    Liver Function Tests: Recent Labs  Lab 06/19/22 0612 06/20/22 0820  AST 9* 10*  ALT 22 17  ALKPHOS 74 82  BILITOT 1.2 1.1  PROT 5.2* 5.4*  ALBUMIN 2.2* 2.1*   CBG: Recent Labs  Lab 06/23/22 0730 06/23/22 1154 06/23/22 1525 06/23/22 2059 06/24/22 0744  GLUCAP 237* 227* 274* 130* 186*    Discharge time spent: greater than 30 minutes.  Signed: Meredeth Ide, MD [facility] 06/24/2022

## 2022-06-24 NOTE — TOC Progression Note (Signed)
Transition of Care [facility]) - Progression Note    Patient Details  Name: Lydia King MRN: [id number] Date of Birth: 1930/03/29  Transition of Care [facility]) CM/SW Contact  Margarito Liner, LCSW Phone Number: 06/24/2022, 10:27 AM  Clinical Narrative: CSW met with patient and daughters. Confirmed interest in [facility]. Explained that patient will have to meet criteria and there is a possibility she would have to return to SNF with hospice if she does not meet that criteria. Made referral to [facility] with [facility].   Expected Discharge Plan: Skilled Nursing Facility Barriers to Discharge: Continued Medical Work up  Expected Discharge Plan and Services     Post Acute Care Choice: Resumption of Svcs/PTA Provider Living arrangements for the past 2 months: Skilled Nursing Facility                                       Social Determinants of Health (SDOH) Interventions SDOH Screenings   Food Insecurity: No Food Insecurity (06/17/2022)  Housing: Low Risk  (06/17/2022)  Transportation Needs: No Transportation Needs (06/17/2022)  Utilities: Not At Risk (06/17/2022)  Depression (PHQ2-9): Low Risk  (10/31/2019)  Tobacco Use: Low Risk  (06/17/2022)    Readmission Risk Interventions    06/18/2022    1:45 PM  Readmission Risk Prevention Plan  PCP or Specialist Appt within 3-5 Days Complete  Social Work Consult for Recovery Care Planning/Counseling Complete  Palliative Care Screening Not Applicable  Medication Review [employer]) Complete

## 2022-06-24 NOTE — Consult Note (Signed)
Consultation Note Date: 06/24/2022 at 1415  Patient Name: Lydia King  DOB: 1930/07/21  MRN: [id number]  Age / Sex: 89 y.o., female  PCP: Lydia Pimple, MD Referring Physician: Meredeth Ide, MD  Reason for Consultation: Hospice Evaluation, Non pain symptom management, and Pain control  HPI/Patient Profile: 89 y.o. female  with past medical history of DVT, CAD, stent placement, stroke, asthma, anemia, HTN, HLD, hiatal hernia, GIB, depression, anxiety, gastric ulcer, diastolic CHF, OSA (CPAP), and aortic stenosis admitted on 06/16/2022 with pain.  Troponin peak was 415.  Cardiology was consulted.  VQ scan showed intermediate to high probability for pulmonary embolism.  CTA of chest could not be obtained due to contrast allergy.  5/28, patient discussed goals of care with Dr. Sharl Ma.  Patient made decision to proceed with comfort care measures only.   TOC has already spoken with [facility]  liaison Lydia King to have patient evaluated for inpatient placement.  PMT was consulted to assist with comfort care orders while hospitalized.   Clinical Assessment and Goals of Care: I have reviewed medical records including EPIC notes, labs and imaging, assessed the patient and then met with patient and her daughter/HCPOA Lydia King to discuss diagnosis prognosis, GOC, EOL wishes, disposition and options.  Patient is sleeping in no apparent distress during our discussion.  She does not awaken during the entirety of my visit.  Lydia King provides all medical history.  I introduced Palliative Medicine as specialized medical care for people living with serious illness. It focuses on providing relief from the symptoms and stress of a serious illness. The goal is to improve quality of life for both the patient and the family.  We discussed a brief life review of the patient.  Lydia King shares that her mother is her best friend and prayer Lydia King.   She is very close with her mother, who has been saying her entire life that she is preparing to meet Jesus.  Patient's face is extremely important to her.  As far as functional and nutritional status Lydia King recalls patient was having great difficulty moving from bed to her bathroom, just a few feet away.  Her appetite has decreased and she has stated repeatedly that she is just not hungry.  Patient has had a decline in functional and nutritional status.  However, Lydia King endorses patient's cognitive abilities remains completely intact.  We discussed patient's current illness and what it means in the larger context of patient's on-going co-morbidities.  Lydia King shares that parishes is tired and is ready to see Jesus.  She shares that patient has been having vision kneeing and picturing those who have already passed away as reappearing alive in her dreams.  Lydia King conveys that patient was clear with with her wishes that she does not want to continue any aggressive measures and wants to focus solely on comfort.  Comfort measures discussed in detail.  I discussed use of as needed nitroglycerin if morphine is not able to sufficiently manage patient's chest pain.  Additionally, I discussed use of Ativan for  anxiety and Haldol for terminal agitation.  Lydia King shares parent is in great pain with any movement.  She shares that [employer] is working and in place at the time but when it becomes the need to change it or to turn the patient, it causes the patient great pain.  She is asking what can be done to minimize moving patient in the bed.  We discussed use of Foley for end-of-life care.  Lydia King in agreement for Foley to be placed at next cluster of patient care.  RN made aware to place Foley with next turning of patient.   I attempted to elicit goals important to patient and family.  Lydia King shares she is hopeful that patient will be accepted to inpatient hospice unit.  During our discussion, liaison Lydia King joined our  conversation and clarified that patient's inpatient approval or denial is still pending.  I discussed that full comfort care measures include unrestricted visitor access, no cardiac monitoring, comfort care tray for the family/visitors, VS at family's request only, and removal of all interventions/medications not solely focused on comfort.  This means discontinuing cardiac monitoring.  Family was in agreement.  Patient has not had a BM in 3 days. DIscussed importance of bowel movements to minimize pain/discomfort. Suppository ordered.   Awaiting response for IPU placement.  PMT will continue to follow and support patient and her family throughout this hospitalization.  PMT contact info given to [facility] and she was encouraged to call with any palliative needs/questions.  Primary Decision Maker HCPOA  Physical Exam Vitals reviewed.  Constitutional:      General: She is not in acute distress.    Appearance: She is not ill-appearing.  HENT:     Head: Normocephalic.  Cardiovascular:     Rate and Rhythm: Normal rate.  Abdominal:     General: Bowel sounds are normal.     Palpations: There is no mass.     Tenderness: There is no guarding.  Musculoskeletal:     Comments: Generalized weakness  Skin:    General: Skin is warm and dry.  Psychiatric:        Mood and Affect: Mood is not anxious.        Behavior: Behavior is not agitated.     Palliative Assessment/Data:     Thank you for this consult. Palliative medicine will continue to follow and assist holistically.   Time Total: 75 minutes Greater than 50%  of this time was spent counseling and coordinating care related to the above assessment and plan.  Signed by: Georgiann Cocker, DNP, FNP-BC Palliative Medicine    Please contact Palliative Medicine Team phone at [phone] for questions and concerns.  For individual provider: See Loretha Stapler

## 2022-06-28 DIAGNOSIS — I350 Nonrheumatic aortic (valve) stenosis: Secondary | ICD-10-CM | POA: Diagnosis not present

## 2022-06-28 DIAGNOSIS — I214 Non-ST elevation (NSTEMI) myocardial infarction: Secondary | ICD-10-CM | POA: Diagnosis not present

## 2022-06-28 DIAGNOSIS — I110 Hypertensive heart disease with heart failure: Secondary | ICD-10-CM | POA: Diagnosis not present

## 2022-06-28 DIAGNOSIS — D649 Anemia, unspecified: Secondary | ICD-10-CM | POA: Diagnosis not present

## 2022-06-28 DIAGNOSIS — J189 Pneumonia, unspecified organism: Secondary | ICD-10-CM | POA: Diagnosis not present

## 2022-06-28 DIAGNOSIS — E785 Hyperlipidemia, unspecified: Secondary | ICD-10-CM | POA: Diagnosis not present

## 2022-06-28 DIAGNOSIS — G4733 Obstructive sleep apnea (adult) (pediatric): Secondary | ICD-10-CM | POA: Diagnosis not present

## 2022-06-28 DIAGNOSIS — I69318 Other symptoms and signs involving cognitive functions following cerebral infarction: Secondary | ICD-10-CM | POA: Diagnosis not present

## 2022-06-28 DIAGNOSIS — J45909 Unspecified asthma, uncomplicated: Secondary | ICD-10-CM | POA: Diagnosis not present

## 2022-06-28 DIAGNOSIS — K469 Unspecified abdominal hernia without obstruction or gangrene: Secondary | ICD-10-CM | POA: Diagnosis not present

## 2022-06-28 DIAGNOSIS — K259 Gastric ulcer, unspecified as acute or chronic, without hemorrhage or perforation: Secondary | ICD-10-CM | POA: Diagnosis not present

## 2022-06-28 DIAGNOSIS — R1310 Dysphagia, unspecified: Secondary | ICD-10-CM | POA: Diagnosis not present

## 2022-06-28 DIAGNOSIS — I2510 Atherosclerotic heart disease of native coronary artery without angina pectoris: Secondary | ICD-10-CM | POA: Diagnosis not present

## 2022-06-28 DIAGNOSIS — I5030 Unspecified diastolic (congestive) heart failure: Secondary | ICD-10-CM | POA: Diagnosis not present

## 2022-07-28 DEATH — deceased
